# Patient Record
Sex: Male | Born: 1965 | Race: Black or African American | Hispanic: No | Smoking: Current every day smoker
Health system: Southern US, Community
[De-identification: ages and names within clinical notes are randomized; demographics above are authoritative.]

## PROBLEM LIST (undated history)

## (undated) DIAGNOSIS — H5440 Blindness, one eye, unspecified eye: Secondary | ICD-10-CM

## (undated) DIAGNOSIS — E119 Type 2 diabetes mellitus without complications: Secondary | ICD-10-CM

## (undated) DIAGNOSIS — I5020 Unspecified systolic (congestive) heart failure: Secondary | ICD-10-CM

## (undated) DIAGNOSIS — I429 Cardiomyopathy, unspecified: Secondary | ICD-10-CM

## (undated) DIAGNOSIS — I5041 Acute combined systolic (congestive) and diastolic (congestive) heart failure: Secondary | ICD-10-CM

## (undated) DIAGNOSIS — F329 Major depressive disorder, single episode, unspecified: Secondary | ICD-10-CM

## (undated) DIAGNOSIS — N183 Chronic kidney disease, stage 3 unspecified: Secondary | ICD-10-CM

## (undated) DIAGNOSIS — F32A Depression, unspecified: Secondary | ICD-10-CM

## (undated) DIAGNOSIS — F411 Generalized anxiety disorder: Secondary | ICD-10-CM

## (undated) DIAGNOSIS — I10 Essential (primary) hypertension: Secondary | ICD-10-CM

## (undated) DIAGNOSIS — F1410 Cocaine abuse, uncomplicated: Secondary | ICD-10-CM

## (undated) DIAGNOSIS — Z720 Tobacco use: Secondary | ICD-10-CM

## (undated) DIAGNOSIS — G4733 Obstructive sleep apnea (adult) (pediatric): Secondary | ICD-10-CM

## (undated) DIAGNOSIS — I639 Cerebral infarction, unspecified: Secondary | ICD-10-CM

## (undated) DIAGNOSIS — E7800 Pure hypercholesterolemia, unspecified: Secondary | ICD-10-CM

## (undated) HISTORY — DX: Type 2 diabetes mellitus without complications: E11.9

## (undated) HISTORY — PX: CATARACT EXTRACTION: SUR2

## (undated) HISTORY — DX: Major depressive disorder, single episode, unspecified: F32.9

## (undated) HISTORY — DX: Pure hypercholesterolemia, unspecified: E78.00

## (undated) HISTORY — DX: Depression, unspecified: F32.A

---

## 2014-03-02 ENCOUNTER — Inpatient Hospital Stay (HOSPITAL_COMMUNITY)
Admission: EM | Admit: 2014-03-02 | Discharge: 2014-03-05 | DRG: 287 | Disposition: A | Discharge status: Home / Self Care | Payer: Medicaid Other | Attending: Internal Medicine | Admitting: Internal Medicine

## 2014-03-02 ENCOUNTER — Encounter (HOSPITAL_COMMUNITY): Payer: Self-pay | Admitting: Emergency Medicine

## 2014-03-02 ENCOUNTER — Emergency Department (HOSPITAL_COMMUNITY): Payer: Medicaid Other

## 2014-03-02 DIAGNOSIS — Z5989 Other problems related to housing and economic circumstances: Secondary | ICD-10-CM

## 2014-03-02 DIAGNOSIS — E8770 Fluid overload, unspecified: Secondary | ICD-10-CM | POA: Insufficient documentation

## 2014-03-02 DIAGNOSIS — Z6837 Body mass index (BMI) 37.0-37.9, adult: Secondary | ICD-10-CM

## 2014-03-02 DIAGNOSIS — E876 Hypokalemia: Secondary | ICD-10-CM | POA: Diagnosis not present

## 2014-03-02 DIAGNOSIS — I428 Other cardiomyopathies: Secondary | ICD-10-CM

## 2014-03-02 DIAGNOSIS — I5021 Acute systolic (congestive) heart failure: Secondary | ICD-10-CM

## 2014-03-02 DIAGNOSIS — Z598 Other problems related to housing and economic circumstances: Secondary | ICD-10-CM

## 2014-03-02 DIAGNOSIS — I498 Other specified cardiac arrhythmias: Secondary | ICD-10-CM | POA: Diagnosis not present

## 2014-03-02 DIAGNOSIS — I5041 Acute combined systolic (congestive) and diastolic (congestive) heart failure: Principal | ICD-10-CM | POA: Diagnosis present

## 2014-03-02 DIAGNOSIS — I10 Essential (primary) hypertension: Secondary | ICD-10-CM | POA: Diagnosis present

## 2014-03-02 DIAGNOSIS — Z91199 Patient's noncompliance with other medical treatment and regimen due to unspecified reason: Secondary | ICD-10-CM

## 2014-03-02 DIAGNOSIS — I5042 Chronic combined systolic (congestive) and diastolic (congestive) heart failure: Secondary | ICD-10-CM

## 2014-03-02 DIAGNOSIS — Z8249 Family history of ischemic heart disease and other diseases of the circulatory system: Secondary | ICD-10-CM

## 2014-03-02 DIAGNOSIS — Z9119 Patient's noncompliance with other medical treatment and regimen: Secondary | ICD-10-CM

## 2014-03-02 DIAGNOSIS — J189 Pneumonia, unspecified organism: Secondary | ICD-10-CM | POA: Diagnosis present

## 2014-03-02 DIAGNOSIS — I129 Hypertensive chronic kidney disease with stage 1 through stage 4 chronic kidney disease, or unspecified chronic kidney disease: Secondary | ICD-10-CM | POA: Diagnosis present

## 2014-03-02 DIAGNOSIS — N182 Chronic kidney disease, stage 2 (mild): Secondary | ICD-10-CM | POA: Diagnosis present

## 2014-03-02 DIAGNOSIS — Z23 Encounter for immunization: Secondary | ICD-10-CM

## 2014-03-02 DIAGNOSIS — Z6838 Body mass index (BMI) 38.0-38.9, adult: Secondary | ICD-10-CM

## 2014-03-02 DIAGNOSIS — F17200 Nicotine dependence, unspecified, uncomplicated: Secondary | ICD-10-CM | POA: Diagnosis present

## 2014-03-02 DIAGNOSIS — E785 Hyperlipidemia, unspecified: Secondary | ICD-10-CM | POA: Diagnosis present

## 2014-03-02 DIAGNOSIS — Z5987 Material hardship due to limited financial resources, not elsewhere classified: Secondary | ICD-10-CM

## 2014-03-02 DIAGNOSIS — J811 Chronic pulmonary edema: Secondary | ICD-10-CM

## 2014-03-02 HISTORY — DX: Cardiomyopathy, unspecified: I42.9

## 2014-03-02 HISTORY — DX: Acute combined systolic (congestive) and diastolic (congestive) heart failure: I50.41

## 2014-03-02 HISTORY — DX: Morbid (severe) obesity due to excess calories: E66.01

## 2014-03-02 HISTORY — DX: Tobacco use: Z72.0

## 2014-03-02 HISTORY — DX: Essential (primary) hypertension: I10

## 2014-03-02 LAB — BASIC METABOLIC PANEL
BUN: 12 mg/dL (ref 6–23)
CO2: 24 mEq/L (ref 19–32)
Calcium: 9.2 mg/dL (ref 8.4–10.5)
Chloride: 103 mEq/L (ref 96–112)
Creatinine, Ser: 1.2 mg/dL (ref 0.50–1.35)
GFR calc Af Amer: 82 mL/min — ABNORMAL LOW (ref >90)
GFR calc non Af Amer: 70 mL/min — ABNORMAL LOW (ref >90)
Glucose, Bld: 133 mg/dL — ABNORMAL HIGH (ref 70–99)
Potassium: 3.8 mEq/L (ref 3.7–5.3)
Sodium: 141 mEq/L (ref 137–147)

## 2014-03-02 LAB — CBC WITH DIFFERENTIAL/PLATELET
Basophils Absolute: 0 10*3/uL (ref 0.0–0.1)
Basophils Relative: 1 % (ref 0–1)
Eosinophils Absolute: 0.1 10*3/uL (ref 0.0–0.7)
Eosinophils Relative: 2 % (ref 0–5)
HCT: 42.2 % (ref 39.0–52.0)
Hemoglobin: 14.4 g/dL (ref 13.0–17.0)
Lymphocytes Relative: 42 % (ref 12–46)
Lymphs Abs: 1.8 10*3/uL (ref 0.7–4.0)
MCH: 29.8 pg (ref 26.0–34.0)
MCHC: 34.1 g/dL (ref 30.0–36.0)
MCV: 87.2 fL (ref 78.0–100.0)
Monocytes Absolute: 0.4 10*3/uL (ref 0.1–1.0)
Monocytes Relative: 8 % (ref 3–12)
Neutro Abs: 2.1 10*3/uL (ref 1.7–7.7)
Neutrophils Relative %: 48 % (ref 43–77)
Platelets: 219 10*3/uL (ref 150–400)
RBC: 4.84 MIL/uL (ref 4.22–5.81)
RDW: 13.6 % (ref 11.5–15.5)
WBC: 4.4 10*3/uL (ref 4.0–10.5)

## 2014-03-02 LAB — GLUCOSE, CAPILLARY
Glucose-Capillary: 157 mg/dL — ABNORMAL HIGH (ref 70–99)
Glucose-Capillary: 128 mg/dL — ABNORMAL HIGH (ref 70–99)

## 2014-03-02 LAB — TROPONIN I
Troponin I: <0.3 ng/mL (ref <0.30)
Troponin I: <0.3 ng/mL (ref <0.30)
Troponin I: <0.3 ng/mL (ref <0.30)

## 2014-03-02 LAB — PRO B NATRIURETIC PEPTIDE: Pro B Natriuretic peptide (BNP): 647.8 pg/mL — ABNORMAL HIGH (ref 0–125)

## 2014-03-02 LAB — HEMOGLOBIN A1C
Hgb A1c MFr Bld: 6.5 % — ABNORMAL HIGH (ref <5.7)
Mean Plasma Glucose: 140 mg/dL — ABNORMAL HIGH (ref <117)

## 2014-03-02 LAB — RAPID URINE DRUG SCREEN, HOSP PERFORMED
Amphetamines: NOT DETECTED
Barbiturates: NOT DETECTED
Benzodiazepines: NOT DETECTED
Cocaine: NOT DETECTED
Opiates: NOT DETECTED
Tetrahydrocannabinol: NOT DETECTED

## 2014-03-02 LAB — STREP PNEUMONIAE URINARY ANTIGEN: Strep Pneumo Urinary Antigen: NEGATIVE

## 2014-03-02 LAB — TSH: TSH: 1.478 u[IU]/mL (ref 0.350–4.500)

## 2014-03-02 MED ORDER — DEXTROSE 5 % IV SOLN
2.0000 g | INTRAVENOUS | Status: DC
  Administered 2014-03-03: 2 g via INTRAVENOUS
  Filled 2014-03-02: qty 2

## 2014-03-02 MED ORDER — DEXTROSE 5 % IV SOLN
500.0000 mg | Freq: Once | INTRAVENOUS | Status: AC
  Administered 2014-03-02: 500 mg via INTRAVENOUS

## 2014-03-02 MED ORDER — ACETAMINOPHEN 650 MG RE SUPP: 650.0000 mg | Freq: Four times a day (QID) | RECTAL | Status: DC | PRN

## 2014-03-02 MED ORDER — FUROSEMIDE 10 MG/ML IJ SOLN
40.0000 mg | Freq: Two times a day (BID) | INTRAMUSCULAR | Status: DC
  Administered 2014-03-02 – 2014-03-03 (×3): 40 mg via INTRAVENOUS
  Filled 2014-03-02 (×3): qty 4

## 2014-03-02 MED ORDER — PROMETHAZINE HCL 25 MG/ML IJ SOLN: 12.5000 mg | Freq: Four times a day (QID) | INTRAMUSCULAR | Status: DC | PRN

## 2014-03-02 MED ORDER — DEXTROSE 5 % IV SOLN
1.0000 g | Freq: Once | INTRAVENOUS | Status: DC
  Filled 2014-03-02: qty 10

## 2014-03-02 MED ORDER — INFLUENZA VAC SPLIT QUAD 0.5 ML IM SUSP
0.5000 mL | INTRAMUSCULAR | Status: AC
  Administered 2014-03-03: 0.5 mL via INTRAMUSCULAR
  Filled 2014-03-02: qty 0.5

## 2014-03-02 MED ORDER — FUROSEMIDE 10 MG/ML IJ SOLN
20.0000 mg | Freq: Once | INTRAMUSCULAR | Status: AC
  Administered 2014-03-02: 20 mg via INTRAVENOUS
  Filled 2014-03-02: qty 2

## 2014-03-02 MED ORDER — HYDRALAZINE HCL 20 MG/ML IJ SOLN
5.0000 mg | INTRAMUSCULAR | Status: DC | PRN
  Administered 2014-03-02: 5 mg via INTRAVENOUS
  Filled 2014-03-02: qty 1

## 2014-03-02 MED ORDER — HYDRALAZINE HCL 20 MG/ML IJ SOLN: 5.0000 mg | INTRAMUSCULAR | Status: DC | PRN

## 2014-03-02 MED ORDER — ACETAMINOPHEN 325 MG PO TABS
650.0000 mg | ORAL_TABLET | Freq: Four times a day (QID) | ORAL | Status: DC | PRN
  Administered 2014-03-02: 650 mg via ORAL
  Filled 2014-03-02: qty 2

## 2014-03-02 MED ORDER — DEXTROSE 5 % IV SOLN
1.0000 g | Freq: Once | INTRAVENOUS | Status: AC
  Administered 2014-03-02: 1 g via INTRAVENOUS
  Filled 2014-03-02: qty 10

## 2014-03-02 MED ORDER — ENOXAPARIN SODIUM 40 MG/0.4ML ~~LOC~~ SOLN
40.0000 mg | SUBCUTANEOUS | Status: DC
  Administered 2014-03-02 – 2014-03-03 (×2): 40 mg via SUBCUTANEOUS
  Filled 2014-03-02 (×4): qty 0.4

## 2014-03-02 MED ORDER — PROMETHAZINE HCL 25 MG PO TABS: 12.5000 mg | ORAL_TABLET | Freq: Four times a day (QID) | ORAL | Status: DC | PRN

## 2014-03-02 MED ORDER — PNEUMOCOCCAL VAC POLYVALENT 25 MCG/0.5ML IJ INJ
0.5000 mL | INJECTION | INTRAMUSCULAR | Status: AC
  Administered 2014-03-03: 0.5 mL via INTRAMUSCULAR
  Filled 2014-03-02: qty 0.5

## 2014-03-02 MED ORDER — SODIUM CHLORIDE 0.9 % IJ SOLN
3.0000 mL | Freq: Two times a day (BID) | INTRAMUSCULAR | Status: DC
  Administered 2014-03-02 – 2014-03-05 (×6): 3 mL via INTRAVENOUS

## 2014-03-02 MED ORDER — NITROGLYCERIN 2 % TD OINT
1.0000 [in_us] | TOPICAL_OINTMENT | Freq: Four times a day (QID) | TRANSDERMAL | Status: DC
  Administered 2014-03-02: 1 [in_us] via TOPICAL
  Filled 2014-03-02: qty 1

## 2014-03-02 MED ORDER — AZITHROMYCIN 250 MG PO TABS
250.0000 mg | ORAL_TABLET | Freq: Every day | ORAL | Status: DC
  Administered 2014-03-03: 250 mg via ORAL
  Filled 2014-03-02: qty 1

## 2014-03-02 NOTE — ED Provider Notes (Signed)
CSN: [id number]     Arrival date & time 03/02/14  4098 History   First MD Initiated Contact with Patient 03/02/14 [phone]     Chief Complaint  Patient presents with  . Shortness of Breath     (Consider location/radiation/quality/duration/timing/severity/associated sxs/prior Treatment) HPI  This is a 48 year old male with a history of hypertension and diabetes who presents with shortness of breath. He presents with a one-week history of progressive shortness of breath. He reports dyspnea on exertion and orthopnea. Patient also reports swelling in his bilateral lower extremities.  He denies any history of heart failure. He denies any history of coronary artery disease. Patient was noted to have labored respirations and would drop his oxygen saturations to 93% when transferring from the wheelchair to bed. Patient denies any chest pain. He does endorse a cough that is nonproductive. He denies any fevers.  Past Medical History  Diagnosis Date  . Hypertension   . Diabetes mellitus without complication    History reviewed. No pertinent past surgical history. History reviewed. No pertinent family history. History  Substance Use Topics  . Smoking status: Not on file  . Smokeless tobacco: Not on file  . Alcohol Use: No    Review of Systems  Constitutional: Negative.  Negative for fever.  Respiratory: Positive for cough and shortness of breath. Negative for chest tightness.   Cardiovascular: Positive for leg swelling. Negative for chest pain.  Gastrointestinal: Negative.  Negative for nausea, vomiting and abdominal pain.  Genitourinary: Negative.  Negative for dysuria.  Musculoskeletal: Negative for back pain.  Skin: Negative for rash.  Neurological: Negative for headaches.  All other systems reviewed and are negative.      Allergies  Review of patient's allergies indicates no known allergies.  Home Medications   Current Outpatient Rx  Name  Route  Sig  Dispense  Refill  .  Dextromethorphan-Guaifenesin (ROBITUSSIN DM PO)   Oral   Take 10 mLs by mouth every 6 (six) hours as needed (for cold symptoms).          BP 161/109  Pulse 86  Temp(Src) 98 F (36.7 C) (Oral)  Resp 23  SpO2 100% Physical Exam  Nursing note and vitals reviewed. Constitutional: He is oriented to person, place, and time.  Obese, tachypnea  HENT:  Head: Normocephalic and atraumatic.  Eyes: Pupils are equal, round, and reactive to light.  Neck: Neck supple. JVD present.  Cardiovascular: Normal rate, regular rhythm and normal heart sounds.   No murmur heard. Pulmonary/Chest: Effort normal and breath sounds normal. No respiratory distress. He has no wheezes.  Fair movement, final crackles at the bases  Abdominal: Soft. Bowel sounds are normal. There is no tenderness. There is no rebound.  Musculoskeletal: He exhibits edema.  2+ bilateral lower extremity edema, pitting to the mid shin  Lymphadenopathy:    He has no cervical adenopathy.  Neurological: He is alert and oriented to person, place, and time.  Skin: Skin is warm and dry.  Psychiatric: He has a normal mood and affect.    ED Course  Procedures (including critical care time) Labs Review Labs Reviewed  BASIC METABOLIC PANEL - Abnormal; Notable for the following:    Glucose, Bld 133 (*)    GFR calc non Af Amer 70 (*)    GFR calc Af Amer 82 (*)    All other components within normal limits  PRO B NATRIURETIC PEPTIDE - Abnormal; Notable for the following:    Pro B Natriuretic peptide (BNP) 647.8 (*)  All other components within normal limits  CULTURE, BLOOD (ROUTINE X 2)  CULTURE, BLOOD (ROUTINE X 2)  CBC WITH DIFFERENTIAL  TROPONIN I   Imaging Review Dg Chest Portable 1 View  03/02/2014   CLINICAL DATA:  48 year old male with shortness of breath  EXAM: PORTABLE CHEST - 1 VIEW  COMPARISON:  None.  FINDINGS: Upper limits normal heart size noted.  Mild pulmonary vascular congestion is noted.  Mild interstitial  prominence is identified.  There is no evidence of focal airspace disease, suspicious pulmonary nodule/mass, pleural effusion, or pneumothorax. No acute bony abnormalities are identified.  IMPRESSION: Interstitial prominence which may represent interstitial infection or interstitial edema.  Upper limits normal heart size and mild pulmonary vascular congestion.   Electronically Signed   By: Hassan Rowan M.D.   On: 03/02/2014 09:51     EKG Interpretation   Date/Time:  Tuesday March 02 2014 09:02:23 EDT Ventricular Rate:  86 PR Interval:  162 QRS Duration: 94 QT Interval:  390 QTC Calculation: 466 R Axis:   78 Text Interpretation:  Normal sinus rhythm with sinus arrhythmia Minimal  voltage criteria for LVH, may be normal variant Nonspecific T wave  abnormality Prolonged QT Abnormal ECG No prior for comparison Confirmed by  Patsie Mccardle  MD, Harith Mccadden ([id number]) on 03/02/2014 9:04:26 AM      MDM   Final diagnoses:  Community acquired pneumonia  Pulmonary edema    Patient presents with dyspnea on exertion, orthopnea and cough. Mild respiratory distress noted with tachypnea. Patient dropped his O2 sats with minimal exertion. Appears volume overloaded on exam. Workup notable for possible pulmonary vascular congestion plus or minus pneumonia. No receptors for age. Patient given azithromycin and Rocephin. Patient also given dose of IV Lasix 20 mg. Given oxygen requirement and clinical picture, the patient would benefit from admission for antibiotics and diuresis. May need echocardiogram as well.    Merryl Hacker, MD 03/02/14 1224

## 2014-03-02 NOTE — ED Notes (Signed)
Admit Doctor at bedside.  

## 2014-03-02 NOTE — ED Notes (Signed)
Patient states onset one week ago shortness of breath with productive cough yellow. Shortness of breath worsening overtime.  Edema bilateral lower feet +1 pedal pulse +1 full sensation.

## 2014-03-02 NOTE — ED Notes (Signed)
Attempted to call report to [place] on [facility], she told me that she would call back

## 2014-03-02 NOTE — Progress Notes (Signed)
Patient arrived from ED via stretcher.  Patient alert and oriented x4.  Patient currently denies pain.  BP 153/116.  Dr. Eulas Post paged to clarify orders for hydralazine prn.  Will continue to monitor patient.

## 2014-03-02 NOTE — ED Notes (Signed)
Pt reports sob that has been getting progressively worse for several days, and having swelling to feet. Labored respirations at triage, spo2 97% on room air. Unable to obtain ekg at triage due to pt unable to sit still.

## 2014-03-02 NOTE — Progress Notes (Signed)
ANTIBIOTIC CONSULT NOTE - INITIAL  Pharmacy Consult for ceftriaxone Indication: CAP  No Known Allergies  Patient Measurements: Ht 5' 10'' Wt 122.9 kg Body mass index is 38.88 kg/(m^2).   Vital Signs: Temp: 98 F (36.7 C) (03/17 0902) Temp src: Oral (03/17 0902) BP: 164/104 mmHg (03/17 1300) Pulse Rate: 86 (03/17 1300)  Intake/Output from this shift: Total I/O In: -  Out: 600 [Urine:600]  Labs:  Recent Labs  03/02/14 0915  WBC 4.4  HGB 14.4  PLT 219  CREATININE 1.20   CrCl is unknown because there is no height on file for the current visit. No results found for this basename: VANCOTROUGH, VANCOPEAK, VANCORANDOM, GENTTROUGH, GENTPEAK, GENTRANDOM, TOBRATROUGH, TOBRAPEAK, TOBRARND, AMIKACINPEAK, AMIKACINTROU, AMIKACIN,  in the last 72 hours   Microbiology: No results found for this or any previous visit (from the past 720 hour(s)).  Medical History: Past Medical History  Diagnosis Date  . Hypertension   . Diabetes mellitus without complication     Medications:  Scheduled:  . nitroGLYCERIN  1 inch Topical 4 times per day   Infusions:  . azithromycin 500 mg (03/02/14 1301)   Assessment: 48 yo M presents to ED with SOB that has progressively worsened over past week.  Patient received one time doses of ceftriazone IV 1g and azithromycin IV 500mg  in the ED.  Pharmacy consulted to continue dosing ceftriaxone for CAP. Patient is afebrile and initial labs reveal WBC wnl. CXR reveals interstitial prominence which may represent interstitial infection or interstitial edema.  Goal of Therapy:  Resolution of infection  Plan:  - s/p ceftriaxone IV 1gm in ED, give additional 1gm now, followed by 2gm q24h - no renal adjustment needed for ceftriaxone - will sign off  Ovid Curd E. Jacqlyn Larsen, PharmD Clinical Pharmacist - Resident Pager: [phone] Pharmacy: [phone] 03/02/2014 2:00 PM

## 2014-03-02 NOTE — ED Notes (Signed)
Report called to [facility] RN 

## 2014-03-02 NOTE — H&P (Signed)
Date: 03/02/2014               Patient Name:  Matthew [name] MRN: [id number]  DOB: May 03, 1966 Age / Sex: 48 y.o., male   PCP: [facility]         Medical Service: Internal Medicine Teaching Service         Attending Physician: Dr. Dominic Pea, DO    First Contact: Dr. Lesly Dukes Pager: [phone]  Second Contact: Dr. Randell Loop Pager: [phone]       After Hours (After 5p/  First Contact Pager: [phone]  weekends / holidays): Second Contact Pager: [phone]   Chief Complaint: Shortness of breath  History of Present Illness:  Matthew [name] is a 48 y.o. male unassigned patient with PMH HTN who presents to the ED with a chief complaint of shortness of breath.  Matthew [name] presents with a week long history of progressive shortness of breath. It is worse on exertion and especially when lying flat. He has had to sleep in a recliner for the past week. Dyspnea is associated with a cough productive of white sputum, as well as leg swelling. He has also had runny nose and itchy eyes along with subjective fevers, so he chalked his symptoms off to a cold and took some Robitussin, which did not help. He has not tried any other medications. He denies chest pain, nausea, vomiting, diarrhea. He denies weight gain.  This has never happened before. He denies a history of heart failure or coronary artery disease. He has never had a stress test. He does have a family history of heart disease; his father had heart attack in his 26s.  He is a patient at the [facility], but has not seen a provider there in over a year. I called them and they do not have records before July 2014 due to a change in ownership.  He used to take Atenolol for his blood pressure, but has not been compliant in several months. I called his pharmacy, last refill was 10/14 for Atenolol 50mg . He thinks he was on a diuretic in the past, but the pharmacy has no record of this. He cannot remember the name.  He smokes one  pack of cigarettes per week x10 years. Denies alcohol or drug use, including cocaine.  In the ED, patient was noted to have labored respirations and would drop his oxygen saturations to 93% when transferring from the wheelchair to bed. He was given Lasix 20 mg IV, nitroglycerin ointment, and started on azithromycin and ceftriaxone IV.   Meds: Current Facility-Administered Medications  Medication Dose Route Frequency Provider Last Rate Last Dose  . azithromycin (ZITHROMAX) 500 mg in dextrose 5 % 250 mL IVPB  500 mg Intravenous Once Merryl Hacker, MD 250 mL/hr at 03/02/14 1301 500 mg at 03/02/14 1301  . nitroGLYCERIN (NITROGLYN) 2 % ointment 1 inch  1 inch Topical 4 times per day Merryl Hacker, MD   1 inch at 03/02/14 1234   Current Outpatient Prescriptions  Medication Sig Dispense Refill  . Dextromethorphan-Guaifenesin (ROBITUSSIN DM PO) Take 10 mLs by mouth every 6 (six) hours as needed (for cold symptoms).        Allergies: Allergies as of 03/02/2014  . (No Known Allergies)   Past Medical History  Diagnosis Date  . Hypertension   . Diabetes mellitus without complication    History reviewed. No pertinent past surgical history. History reviewed. No pertinent family history. History   Social History  .  Marital Status: Married    Spouse Name: N/A    Number of Children: N/A  . Years of Education: N/A   Occupational History  . Not on file.   Social History Main Topics  . Smoking status: Not on file  . Smokeless tobacco: Not on file  . Alcohol Use: No  . Drug Use: No  . Sexual Activity: Not on file   Other Topics Concern  . Not on file   Social History Narrative  . No narrative on file    Review of Systems: Pertinent items are noted in HPI.  Physical Exam: Blood pressure 164/104, pulse 86, temperature 98 F (36.7 C), temperature source Oral, resp. rate 21, height 5\' 10"  (1.778 m), weight 271 lb (122.925 kg), SpO2 97.00%. Physical Exam  Constitutional: He  is oriented to person, place, and time and well-developed, well-nourished, and in no distress.  Obese.  HENT:  Head: Normocephalic and atraumatic.  Mouth/Throat: Oropharynx is clear and moist.  Eyes: Conjunctivae and EOM are normal. Pupils are equal, round, and reactive to light.  Neck: Normal range of motion. Neck supple. No JVD (No obvious JVD, but exam limited by body habitus) present.  Cardiovascular: Normal rate, regular rhythm and normal heart sounds.  Exam reveals no gallop and no friction rub.   No murmur heard. Pulmonary/Chest: Effort normal. No respiratory distress. He has no wheezes. He has rales (Fine bibasilar crackles heard bilaterally). He exhibits no tenderness.  Abdominal: Soft. Bowel sounds are normal. He exhibits no distension. There is no tenderness.  Musculoskeletal: Normal range of motion. He exhibits edema (2+ pitting pretibial edema). He exhibits no tenderness.  Neurological: He is alert and oriented to person, place, and time. No cranial nerve deficit.  Skin: Skin is warm and dry. No rash noted. He is not diaphoretic. No erythema.  Psychiatric: Affect normal.     Lab results: Basic Metabolic Panel:  Recent Labs  03/02/14 0915  NA 141  K 3.8  CL 103  CO2 24  GLUCOSE 133*  BUN 12  CREATININE 1.20  CALCIUM 9.2   Liver Function Tests: No results found for this basename: AST, ALT, ALKPHOS, BILITOT, PROT, ALBUMIN,  in the last 72 hours No results found for this basename: LIPASE, AMYLASE,  in the last 72 hours No results found for this basename: AMMONIA,  in the last 72 hours CBC:  Recent Labs  03/02/14 0915  WBC 4.4  NEUTROABS 2.1  HGB 14.4  HCT 42.2  MCV 87.2  PLT 219   Cardiac Enzymes:  Recent Labs  03/02/14 0920  TROPONINI <0.30   BNP:  Recent Labs  03/02/14 0920  PROBNP 647.8*   D-Dimer: No results found for this basename: DDIMER,  in the last 72 hours CBG: No results found for this basename: GLUCAP,  in the last 72  hours Hemoglobin A1C: No results found for this basename: HGBA1C,  in the last 72 hours Fasting Lipid Panel: No results found for this basename: CHOL, HDL, LDLCALC, TRIG, CHOLHDL, LDLDIRECT,  in the last 72 hours Thyroid Function Tests: No results found for this basename: TSH, T4TOTAL, FREET4, T3FREE, THYROIDAB,  in the last 72 hours Anemia Panel: No results found for this basename: VITAMINB12, FOLATE, FERRITIN, TIBC, IRON, RETICCTPCT,  in the last 72 hours Coagulation: No results found for this basename: LABPROT, INR,  in the last 72 hours Urine Drug Screen: Drugs of Abuse  No results found for this basename: labopia,  cocainscrnur,  labbenz,  amphetmu,  thcu,  labbarb    Alcohol Level: No results found for this basename: ETH,  in the last 72 hours Urinalysis: No results found for this basename: COLORURINE, APPERANCEUR, LABSPEC, PHURINE, GLUCOSEU, HGBUR, BILIRUBINUR, KETONESUR, PROTEINUR, UROBILINOGEN, NITRITE, LEUKOCYTESUR,  in the last 72 hours   Imaging results:  Dg Chest Portable 1 View  03/02/2014   CLINICAL DATA:  48 year old male with shortness of breath  EXAM: PORTABLE CHEST - 1 VIEW  COMPARISON:  None.  FINDINGS: Upper limits normal heart size noted.  Mild pulmonary vascular congestion is noted.  Mild interstitial prominence is identified.  There is no evidence of focal airspace disease, suspicious pulmonary nodule/mass, pleural effusion, or pneumothorax. No acute bony abnormalities are identified.  IMPRESSION: Interstitial prominence which may represent interstitial infection or interstitial edema.  Upper limits normal heart size and mild pulmonary vascular congestion.   Electronically Signed   By: Hassan Rowan M.D.   On: 03/02/2014 09:51    Other results: EKG: There are no previous tracings available for comparison. Cormal sinus rhythm, normal axis, rate 86. T wave flattening in V4-6, but no inversions. QTc slightly prolonged at 466.  Assessment & Plan by Problem: Matthew [name]  is a 48 y.o. male unassigned patient with PMH HTN who presents to the ED with a chief complaint of shortness of breath.  #Progressive dyspnea with volume overload, rule out new onset heart failure - Patient presents with one-week of progressive dyspnea, orthopnea, and leg swelling. Pro-BNP is elevated at 647.8. Chest x-ray shows possible interstitial edema, cardiomegaly (portable film) and pulmonary vascular congestion. He has no prior history of heart failure, but has uncontrolled hypertension as well as a family history of coronary artery disease at a young age. We'll rule out MI with serial troponins as this is a common cause of acute heart failure. I do not think he has pneumonia as he is afebrile without leukocytosis and we have a better alternative explanation, but we will continue antibiotics until a diagnosis of heart failure is confirmed. Wells score 0, low risk for PE and no significant oxygen desaturations. - Admit to IMTS, telemetry - Lasix 40 mg IV twice a day - 2D echo - Trending troponins - TSH - UDS - HIV - Repeat CXR in am, PA/lateral - Repeat EKG in am - Strict ins and outs - Daily weights - Heart healthy/carb modified diet - Continue CAP coverage with azithromycin and ceftriaxone IV until 2D echo results - Checking legionella and strep pneumo antigens - Follow up blood cultures x2 - Tylenol when necessary pain or fever - Phenergan when necessary for nausea (given slightly prolonged QTc) - BMP in am  #HTN - BP 164/104 currently. Previously on Atenolol, but noncompliant at home. - Hydralazine 5 mg every 4 hours when necessary for systolic blood pressure >161 - Will add most appropriate BP meds tomorrow based on 2D echo results (ACE-I, BB, etc.)  #CKD stage 2 - Cr 1.2, GFR 82. - Continue to monitor with daily BMP  #Hyperglycemia - CBG 133. He denies a history of diabetes. - Checking hemoglobin A1c - CBG monitoring every 4 hours  #DVT PPX - Lovenox subq   Dispo:  Disposition is deferred at this time, awaiting improvement of current medical problems. Anticipated discharge in approximately 1-3 day(s).   The patient does have a current PCP [facility]) and does not need an [facility] hospital follow-up appointment after discharge.  The patient does not have transportation limitations that hinder transportation to clinic appointments.  Signed: Judson Roch  Tationa Stech, MD 03/02/2014, 1:59 PM   Lesly Dukes, MD  [email or web address] Pager # [phone] Office # [phone]

## 2014-03-03 ENCOUNTER — Other Ambulatory Visit: Payer: Self-pay

## 2014-03-03 ENCOUNTER — Inpatient Hospital Stay (HOSPITAL_COMMUNITY): Payer: Medicaid Other

## 2014-03-03 ENCOUNTER — Encounter (HOSPITAL_COMMUNITY): Payer: Self-pay | Admitting: Nurse Practitioner

## 2014-03-03 DIAGNOSIS — I420 Dilated cardiomyopathy: Secondary | ICD-10-CM | POA: Insufficient documentation

## 2014-03-03 DIAGNOSIS — E8779 Other fluid overload: Secondary | ICD-10-CM

## 2014-03-03 DIAGNOSIS — I059 Rheumatic mitral valve disease, unspecified: Secondary | ICD-10-CM

## 2014-03-03 DIAGNOSIS — I129 Hypertensive chronic kidney disease with stage 1 through stage 4 chronic kidney disease, or unspecified chronic kidney disease: Secondary | ICD-10-CM

## 2014-03-03 DIAGNOSIS — N182 Chronic kidney disease, stage 2 (mild): Secondary | ICD-10-CM

## 2014-03-03 DIAGNOSIS — I5042 Chronic combined systolic (congestive) and diastolic (congestive) heart failure: Secondary | ICD-10-CM

## 2014-03-03 DIAGNOSIS — E119 Type 2 diabetes mellitus without complications: Secondary | ICD-10-CM

## 2014-03-03 DIAGNOSIS — I5021 Acute systolic (congestive) heart failure: Secondary | ICD-10-CM | POA: Insufficient documentation

## 2014-03-03 DIAGNOSIS — R0989 Other specified symptoms and signs involving the circulatory and respiratory systems: Secondary | ICD-10-CM

## 2014-03-03 DIAGNOSIS — E876 Hypokalemia: Secondary | ICD-10-CM

## 2014-03-03 DIAGNOSIS — F17200 Nicotine dependence, unspecified, uncomplicated: Secondary | ICD-10-CM | POA: Diagnosis present

## 2014-03-03 DIAGNOSIS — R0609 Other forms of dyspnea: Secondary | ICD-10-CM

## 2014-03-03 LAB — LEGIONELLA ANTIGEN, URINE: Legionella Antigen, Urine: NEGATIVE

## 2014-03-03 LAB — BASIC METABOLIC PANEL
BUN: 12 mg/dL (ref 6–23)
CO2: 27 mEq/L (ref 19–32)
Calcium: 9.3 mg/dL (ref 8.4–10.5)
Chloride: 102 mEq/L (ref 96–112)
Creatinine, Ser: 1.28 mg/dL (ref 0.50–1.35)
GFR calc Af Amer: 76 mL/min — ABNORMAL LOW (ref >90)
GFR calc non Af Amer: 65 mL/min — ABNORMAL LOW (ref >90)
Glucose, Bld: 113 mg/dL — ABNORMAL HIGH (ref 70–99)
Potassium: 3.6 mEq/L — ABNORMAL LOW (ref 3.7–5.3)
Sodium: 144 mEq/L (ref 137–147)

## 2014-03-03 LAB — HIV ANTIBODY (ROUTINE TESTING W REFLEX): HIV: NONREACTIVE

## 2014-03-03 LAB — GLUCOSE, CAPILLARY
Glucose-Capillary: 101 mg/dL — ABNORMAL HIGH (ref 70–99)
Glucose-Capillary: 106 mg/dL — ABNORMAL HIGH (ref 70–99)
Glucose-Capillary: 107 mg/dL — ABNORMAL HIGH (ref 70–99)
Glucose-Capillary: 108 mg/dL — ABNORMAL HIGH (ref 70–99)
Glucose-Capillary: 118 mg/dL — ABNORMAL HIGH (ref 70–99)
Glucose-Capillary: 89 mg/dL (ref 70–99)

## 2014-03-03 MED ORDER — FUROSEMIDE 10 MG/ML IJ SOLN
80.0000 mg | Freq: Two times a day (BID) | INTRAMUSCULAR | Status: DC
  Administered 2014-03-04 – 2014-03-05 (×3): 80 mg via INTRAVENOUS
  Filled 2014-03-03 (×5): qty 8

## 2014-03-03 MED ORDER — HYDRALAZINE HCL 10 MG PO TABS
10.0000 mg | ORAL_TABLET | Freq: Three times a day (TID) | ORAL | Status: DC
  Administered 2014-03-03 – 2014-03-05 (×6): 10 mg via ORAL
  Filled 2014-03-03 (×9): qty 1

## 2014-03-03 MED ORDER — CARVEDILOL 3.125 MG PO TABS
3.1250 mg | ORAL_TABLET | Freq: Two times a day (BID) | ORAL | Status: DC
  Administered 2014-03-03 – 2014-03-05 (×4): 3.125 mg via ORAL
  Filled 2014-03-03 (×6): qty 1

## 2014-03-03 MED ORDER — ASPIRIN 81 MG PO CHEW
81.0000 mg | CHEWABLE_TABLET | Freq: Every day | ORAL | Status: DC
  Administered 2014-03-03 – 2014-03-05 (×3): 81 mg via ORAL
  Filled 2014-03-03 (×3): qty 1

## 2014-03-03 MED ORDER — IPRATROPIUM-ALBUTEROL 0.5-2.5 (3) MG/3ML IN SOLN
3.0000 mL | RESPIRATORY_TRACT | Status: DC
  Administered 2014-03-03: 3 mL via RESPIRATORY_TRACT
  Filled 2014-03-03: qty 3

## 2014-03-03 MED ORDER — ISOSORBIDE MONONITRATE 15 MG HALF TABLET
15.0000 mg | ORAL_TABLET | Freq: Every day | ORAL | Status: DC
  Administered 2014-03-03 – 2014-03-05 (×3): 15 mg via ORAL
  Filled 2014-03-03 (×3): qty 1

## 2014-03-03 MED ORDER — POTASSIUM CHLORIDE CRYS ER 20 MEQ PO TBCR
40.0000 meq | EXTENDED_RELEASE_TABLET | Freq: Once | ORAL | Status: AC
  Administered 2014-03-05: 40 meq via ORAL
  Filled 2014-03-03: qty 2

## 2014-03-03 MED ORDER — IPRATROPIUM-ALBUTEROL 0.5-2.5 (3) MG/3ML IN SOLN: 3.0000 mL | RESPIRATORY_TRACT | Status: DC | PRN

## 2014-03-03 NOTE — Consult Note (Signed)
Patient seen and examined. Agree with history above by Murray Hodgkins. The patient has at least a fiber history of hypertension and unsure whether he has been well controlled or not. He was originally diagnosed by a family physician several years ago and went without medicines for a few years and has taken no medicines for the past couple of months. He has no ischemic symptoms but does have a strong family history of cardiac disease and smokes cigarettes. 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  IMPRESSIONS:  1. Cardiomyopathy undetermined type-the history would favor perhaps long-standing uncontrolled hypertension as a cause but certainly ischemia and coronary artery disease needs to be assessed. The marked left atrial enlargement as well as the tortuosity aorta suggests long-standing hypertension as a cause. 2. Obesity 3. Hypertension currently uncontrolled 4. Tobacco abuse advised to stop  RECOMMENDATIONS:  Agree with catheterization as above. I discussed this with the patient and his wife and his group will to proceed in the morning. I would hold off on ACE inhibition and initiate additional antihypertensives including carvedilol as well as hydralazine. He likely will require multidrug regimen for control of his blood pressure. He and his wife have a home blood pressure cuff and advised him to monitor blood pressures at home.  Kerry Hough. MD [facility]  03/03/2014

## 2014-03-03 NOTE — Discharge Summary (Signed)
Name: Matthew [name] MRN: [id number] DOB: 09-05-1966 48 y.o. PCP: No Pcp Per Patient - He will establish with the [facility]  Date of Admission: 03/02/2014  9:00 AM Date of Discharge: 03/05/2014 Attending Physician: Dominic Pea, DO  Discharge Diagnosis: Principal Problem:   Acute combined systolic and diastolic heart failure Active Problems:   Pneumonia   Hypertension   Tobacco abuse   Morbid obesity   NICM (nonischemic cardiomyopathy)  Discharge Medications:   Medication List         aspirin 81 MG chewable tablet  Chew 1 tablet (81 mg total) by mouth daily.     carvedilol 3.125 MG tablet  Commonly known as:  COREG  Take 1 tablet (3.125 mg total) by mouth 2 (two) times daily with a meal.     furosemide 40 MG tablet  Commonly known as:  LASIX  Take 1 tablet (40 mg total) by mouth 2 (two) times daily.     hydrALAZINE 10 MG tablet  Commonly known as:  APRESOLINE  Take 1 tablet (10 mg total) by mouth every 8 (eight) hours.     isosorbide mononitrate 30 MG 24 hr tablet  Commonly known as:  IMDUR  Take 0.5 tablets (15 mg total) by mouth daily.     lisinopril 5 MG tablet  Commonly known as:  PRINIVIL,ZESTRIL  Take 1 tablet (5 mg total) by mouth daily.     lovastatin 20 MG tablet  Commonly known as:  MEVACOR  Take 2 tablets (40 mg total) by mouth at bedtime.     Potassium Chloride ER 20 MEQ Tbcr  Take 20 mEq by mouth daily.     ROBITUSSIN DM PO  Take 10 mLs by mouth every 6 (six) hours as needed (for cold symptoms).        Disposition and follow-up:   Mr.Matthew [name] was discharged from [facility] in Stable condition.  At the hospital follow up visit please address:  1.  This is a pt with new diagnoses of CHF and DM2. He has no insurance. Referral to [facility]. Can he afford his meds? (Got a [place] letter at D/c.) Referral to [facility]. Recurrent dyspnea on new medication regimen? Please stress that he see Dr. Verl Blalock on 4/1 (HF doc @ [facility]).  2.  Labs / imaging needed at time of follow-up: BMP (Cr), Recommend outpatient sleep study  3.  Pending labs/ test needing follow-up: None  Follow-up Appointments: Follow-up Information   Follow up with Vertell Novak, MD On 03/10/2014. (3:15 for hospital follow up.)    Specialty:  Internal Medicine   Contact information:   [place] [id number] [phone]       Follow up with Jenell Milliner, MD On 03/17/2014. (10:45 am, this is the heart failure clinic inside the [facility])    Specialties:  Cardiology, Internal Medicine   Contact information:   [address] [place] [address] [phone]       Discharge Instructions: Discharge Orders   Future Appointments Provider Department Dept Phone   03/10/2014 3:15 PM Olga Millers, MD [place] [phone]   03/17/2014 10:45 AM Renella Cunas, MD [place] [phone]   Future Orders Complete By Expires   Call MD for:  difficulty breathing, headache or visual disturbances  As directed    Call MD for:  severe uncontrolled pain  As directed  Diet - low sodium heart healthy  As directed    Increase activity slowly  As directed       Consultations: Treatment Team:  Rounding Lbcardiology, MD  Procedures Performed:  Dg Chest 2 View  03/03/2014   CLINICAL DATA:  Follow-up pulmonary edema  EXAM: CHEST  2 VIEW  COMPARISON:  DG CHEST 1V PORT dated 03/02/2014  FINDINGS: The lungs are well-expanded. The pulmonary interstitium has become less prominent. There remains increased interstitial density bilaterally however. The cardiac silhouette is normal in size. The pulmonary vascularity is prominent centrally. There is no pleural effusion. There is mild tortuosity of the descending thoracic aorta.  IMPRESSION: The findings are consistent with improving pulmonary interstitial edema.   Electronically Signed   By: David   Martinique   On: 03/03/2014 08:24   Dg Chest Portable 1 View  03/02/2014   CLINICAL DATA:  48 year old male with shortness of breath  EXAM: PORTABLE CHEST - 1 VIEW  COMPARISON:  None.  FINDINGS: Upper limits normal heart size noted.  Mild pulmonary vascular congestion is noted.  Mild interstitial prominence is identified.  There is no evidence of focal airspace disease, suspicious pulmonary nodule/mass, pleural effusion, or pneumothorax. No acute bony abnormalities are identified.  IMPRESSION: Interstitial prominence which may represent interstitial infection or interstitial edema.  Upper limits normal heart size and mild pulmonary vascular congestion.   Electronically Signed   By: Hassan Rowan M.D.   On: 03/02/2014 09:51    2D Echo:  ------------------------------------------------------------ Transthoracic Echocardiography  Patient: Matthew [name] MR #: [id number] Study Date: 03/03/2014 Gender: M Age: 42 Height: 152.4cm Weight: 122.3kg BSA: 2.50m2 Pt. Status: Room: B16C  SONOGRAPHER ELeavy CellaOInez Pilgrim Ryan PERFORMING [facility], Inpatient cc:  ------------------------------------------------------------ LV EF: 25% - 30%  ------------------------------------------------------------ History: PMH: HIV, Smoker, Pneumonia Volume Overload Risk factors: Hypertension.  ------------------------------------------------------------ Study Conclusions  - Left ventricle: The cavity size was mildly dilated. There was moderate concentric hypertrophy. Systolic function was severely reduced. The estimated ejection fraction was in the range of 25% to 30%. Severe global hypokinesis with regional variation - there appears to be more prounounced inferolateral hypokinesis to akinesis. Doppler parameters are consistent with pseudonormal left ventricular relaxation (grade2 diastolic dysfunction). The E/A ratio is 2.8. The E/e' ratio is >15, suggesting markedly elevated LV filling pressure. -  Mitral valve: Mildly thickened leaflets . Mild regurgitation. - Left atrium: Severely dilated (30 cm2). - Inferior vena cava: The vessel was normal in size; the respirophasic diameter changes were in the normal range (= 50%); findings are consistent with normal central venous pressure. - Pericardium, extracardiac: There was no pericardial effusion. Transthoracic echocardiography. M-mode, complete 2D, spectral Doppler, and color Doppler. Height: Height: 152.4cm. Height: 60in. Weight: Weight: 122.3kg. Weight: 269lb. Body mass index: BMI: 52.6kg/m^2. Body surface area: BSA: 2.378m. Blood pressure: 179/77. Patient status: Inpatient. Location: Bedside.  ------------------------------------------------------------   Cardiac Cath:  Cardiac Catheterization Operative Report  JaHarbor [id number] PM  No PCP Per Patient  Procedure Performed:  1. Left Heart Catheterization 2. Selective Coronary Angiography 3. Right Heart Catheterization Operator: ChLauree ChandlerMD  Indication: [age] male with history of obesity, tobacco abuse, new cardiomyopathy, acute systolic CHF.  Procedure Details:  The risks, benefits, complications, treatment options, and expected outcomes were discussed with the patient. The patient and/or family concurred with the proposed plan, giving informed consent. The patient was brought to the cath lab after IV hydration was begun and oral premedication was given. The patient was further sedated  with Versed and Fentanyl. The right groin was prepped and draped in the usual manner. Using the modified Seldinger access technique, a 5 French sheath was placed in the right femoral artery. A 7 French sheath was inserted into the right femoral vein. A balloon tipped catheter was used to perform a right heart catheterization. Standard diagnostic catheters were used to perform selective coronary angiography. A pigtail catheter was used to measure left ventricular  pressures. There were no immediate complications. The patient was taken to the recovery area in stable condition.  Hemodynamic Findings:  Ao: 144/99  LV: 153/20/32  RA: 8  RV: 35/8/11  PA: 41/19 (mean 33)  PCWP: 14  Fick Cardiac Output: 4.3 L/min  Fick Cardiac Index: 1.84 L/min/m2  Central Aortic Saturation: 98%  Pulmonary Artery Saturation: 61%  Angiographic Findings:  Left main: No obstructive disease.  Left Anterior Descending Artery: Large caliber vessel that courses to the apex. There is a large caliber diagonal vessel. No obstructive disease.  Circumflex Artery: Large caliber vessel with moderate caliber early obtuse marginal branch, smaller caliber posterolateral branch. No obstructive disease.  Right Coronary Artery: Large caliber dominant vessel with no obstructive disease.  Left Ventricular Angiogram: Deferred.  Impression:  1. No angiographic evidence of CAD  2. Mildly elevated filling pressures.  Recommendations: Continue current medical management.  Complications: None; patient tolerated the procedure well.   Admission HPI:  Dezman Granda is a 48 y.o. male unassigned patient with PMH HTN who presents to the ED with a chief complaint of shortness of breath.   Mr. Hegeman presents with a week long history of progressive shortness of breath. It is worse on exertion and especially when lying flat. He has had to sleep in a recliner for the past week. Dyspnea is associated with a cough productive of white sputum, as well as leg swelling. He has also had runny nose and itchy eyes along with subjective fevers, so he chalked his symptoms off to a cold and took some Robitussin, which did not help. He has not tried any other medications. He denies chest pain, nausea, vomiting, diarrhea. He denies weight gain.   This has never happened before. He denies a history of heart failure or coronary artery disease. He has never had a stress test. He does have a family history of heart disease;  his father had heart attack in his 7s.   He is a patient at the [facility] clinic, but has not seen a provider there in over a year. I called them and they do not have records before July 2014 due to a change in ownership.   He used to take Atenolol for his blood pressure, but has not been compliant in several months. I called his pharmacy, last refill was 10/14 for Atenolol 25m. He thinks he was on a diuretic in the past, but the pharmacy has no record of this. He cannot remember the name.   He smokes one pack of cigarettes per week x10 years. Denies alcohol or drug use, including cocaine.   In the ED, patient was noted to have labored respirations and would drop his oxygen saturations to 93% when transferring from the wheelchair to bed. He was given Lasix 20 mg IV, nitroglycerin ointment, and started on azithromycin and ceftriaxone IV.  Physical Exam:  Blood pressure 164/104, pulse 86, temperature 98 F (36.7 C), temperature source Oral, resp. rate 21, height 5' 10" (1.778 m), weight 271 lb (122.925 kg), SpO2 97.00%.  Physical Exam  Constitutional:  He is oriented to person, place, and time and well-developed, well-nourished, and in no distress.  Obese.  HENT:  Head: Normocephalic and atraumatic.  Mouth/Throat: Oropharynx is clear and moist.  Eyes: Conjunctivae and EOM are normal. Pupils are equal, round, and reactive to light.  Neck: Normal range of motion. Neck supple. No JVD (No obvious JVD, but exam limited by body habitus) present.  Cardiovascular: Normal rate, regular rhythm and normal heart sounds. Exam reveals no gallop and no friction rub.  No murmur heard.  Pulmonary/Chest: Effort normal. No respiratory distress. He has no wheezes. He has rales (Fine bibasilar crackles heard bilaterally). He exhibits no tenderness.  Abdominal: Soft. Bowel sounds are normal. He exhibits no distension. There is no tenderness.  Musculoskeletal: Normal range of motion. He exhibits edema (2+  pitting pretibial edema). He exhibits no tenderness.  Neurological: He is alert and oriented to person, place, and time. No cranial nerve deficit.  Skin: Skin is warm and dry. No rash noted. He is not diaphoretic. No erythema.  Psychiatric: Affect normal.    Hospital Course by problem list: Kelcy Baeten is a 48 y.o. male unassigned patient with PMH HTN who presents to the ED with a chief complaint of shortness of breath.  1. Progressive dyspnea with volume overload, 2/2 acute combined systolic and diastolic CHF - Patient presented with one-week of progressive dyspnea, orthopnea, and leg swelling. Pro-BNP was elevated at 647.8. Chest x-ray showed possible interstitial edema, cardiomegaly, and pulmonary vascular congestion. He had no prior history of heart failure, but has uncontrolled hypertension as well as a family history of coronary artery disease at a young age. We ruled out MI with serial troponins and EKGs. Symptoms and CXR improved with IV lasix. 2D echo showed severely reduced systolic function, EF 96-28%, Severe global hypokinesis with regional variation - there appears to be more pronounced inferolateral hypokinesis to akinesis. History favored long-standing uncontrolled hypertension as the cause. He underwent cardiac catheterization to rule out ischemia, it was negative for coronary artery disease. We started the patient on a beta blocker, Imdur, hydralazine, lisinopril, aspirin, low-dose potassium supplementation, and Lasix 40 mg by mouth twice a day. He diuresed -5.7L total since admission. Weight decreased from 272 to 259. He is agreeable to following up in our clinic going forward. Cardiology also recommended he followup with Dr. Verl Blalock, a heart failure doctor at the [facility] and [facility]. I instructed the patient to walk-in to this clinic after his [facility] appointment. I also called and left a voicemail with his information for them to call him about an appointment. Care management  met with the patient and provided him with a match letter for medication assistance.  2. HTN - Elevated on admission, 170s/100s. We started the patient on a beta blocker, Imdur, hydralazine, lisinopril, and Lasix 40 mg by mouth. He was normotensive at discharge.  3. HLD - Lipid panel below. CVD 10-year risk is 24%. He merits a high-dose statin. We started lovastatin 40 mg daily as this is on the [employer] $4 list.  Lipid Panel    Component  Value  Date/Time    CHOL  201*  03/05/2014 0530    TRIG  181*  03/05/2014 0530    HDL  29*  03/05/2014 0530    CHOLHDL  6.9  03/05/2014 0530    VLDL  36  03/05/2014 0530    LDLCALC  136*  03/05/2014 0530     4. CKD stage 2 - Cr ~1.3, GFR 82. Creatinine  was stable status post cath. Repeat BMP as outpatient.  Creatinine, Ser  Date Value Ref Range Status  03/05/2014 1.29  0.50 - 1.35 mg/dL Final  03/04/2014 1.33  0.50 - 1.35 mg/dL Final  03/03/2014 1.28  0.50 - 1.35 mg/dL Final  03/02/2014 1.20  0.50 - 1.35 mg/dL Final    5. Hypokalemia - Patient developed mild hypokalemia on Lasix. We repleted as needed. Patient was discharged on low-dose potassium supplementation.  6. DM2 - His A1C was 6.5. Patient was counseled on lifestyle and diet modification. He met with the nutritionist while he was here. Please arrange for him to meet with [facility] in the outpatient clinic.  Glucose-Capillary   Date  Value  Ref Range  Status   03/05/2014  106*  70 - 99 mg/dL  Final   03/05/2014  116*  70 - 99 mg/dL  Final   03/05/2014  120*  70 - 99 mg/dL  Final   03/05/2014  140*  70 - 99 mg/dL  Final   03/04/2014  165*  70 - 99 mg/dL  Final    7. Preventative care - Flu shot and pneumococcal vaccine given.  8. Sleeping apneic episodes - Noted on exam in the mornings. He would benefit from a sleep study.   Discharge Vitals:   BP 126/61  Pulse 90  Temp(Src) 98.4 F (36.9 C) (Oral)  Resp 16  Ht 5' 10" (1.778 m)  Wt 259 lb 11.2 oz (117.799 kg)  BMI 37.26 kg/m2  SpO2  97%  Discharge Labs:  Results for orders placed during the hospital encounter of 03/02/14 (from the past 24 hour(s))  GLUCOSE, CAPILLARY     Status: None   Collection Time    03/04/14  5:31 PM      Result Value Ref Range   Glucose-Capillary 93  70 - 99 mg/dL  GLUCOSE, CAPILLARY     Status: Abnormal   Collection Time    03/04/14  8:09 PM      Result Value Ref Range   Glucose-Capillary 165 (*) 70 - 99 mg/dL   Comment 1 Notify RN     Comment 2 Documented in Chart    GLUCOSE, CAPILLARY     Status: Abnormal   Collection Time    03/05/14 12:05 AM      Result Value Ref Range   Glucose-Capillary 140 (*) 70 - 99 mg/dL   Comment 1 Notify RN     Comment 2 Documented in Chart    GLUCOSE, CAPILLARY     Status: Abnormal   Collection Time    03/05/14  4:10 AM      Result Value Ref Range   Glucose-Capillary 120 (*) 70 - 99 mg/dL   Comment 1 Notify RN     Comment 2 Documented in Chart    BASIC METABOLIC PANEL     Status: Abnormal   Collection Time    03/05/14  5:30 AM      Result Value Ref Range   Sodium 139  137 - 147 mEq/L   Potassium 3.5 (*) 3.7 - 5.3 mEq/L   Chloride 98  96 - 112 mEq/L   CO2 27  19 - 32 mEq/L   Glucose, Bld 110 (*) 70 - 99 mg/dL   BUN 13  6 - 23 mg/dL   Creatinine, Ser 1.29  0.50 - 1.35 mg/dL   Calcium 9.3  8.4 - 10.5 mg/dL   GFR calc non Af Amer 65 (*) >90 mL/min   GFR  calc Af Amer 75 (*) >90 mL/min  LIPID PANEL     Status: Abnormal   Collection Time    03/05/14  5:30 AM      Result Value Ref Range   Cholesterol 201 (*) 0 - 200 mg/dL   Triglycerides 181 (*) <150 mg/dL   HDL 29 (*) >39 mg/dL   Total CHOL/HDL Ratio 6.9     VLDL 36  0 - 40 mg/dL   LDL Cholesterol 136 (*) 0 - 99 mg/dL  GLUCOSE, CAPILLARY     Status: Abnormal   Collection Time    03/05/14  7:03 AM      Result Value Ref Range   Glucose-Capillary 116 (*) 70 - 99 mg/dL  GLUCOSE, CAPILLARY     Status: Abnormal   Collection Time    03/05/14 11:09 AM      Result Value Ref Range    Glucose-Capillary 106 (*) 70 - 99 mg/dL    Signed: Lesly Dukes, MD 03/05/2014, 3:09 PM   Time Spent on Discharge: 35 minutes Services Ordered on Discharge: None Equipment Ordered on Discharge: None

## 2014-03-03 NOTE — Progress Notes (Signed)
Subjective: Patient seen and examined at the bedside this morning. Symptoms have improved with IV Lasix, he says he slept better overnight. He is still short of breath and belly breathing. Denies chest pain.  Objective: Vital signs in last 24 hours: Filed Vitals:   03/02/14 2225 03/03/14 0055 03/03/14 0417 03/03/14 0947  BP: 158/99 180/85 179/77   Pulse: 89 84 89   Temp:   98.1 F (36.7 C)   TempSrc:   Oral   Resp: 18 18 20    Height:      Weight:   269 lb 2.9 oz (122.1 kg)   SpO2: 99% 98% 99% 99%   Weight change:   Intake/Output Summary (Last 24 hours) at 03/03/14 1043 Last data filed at 03/03/14 1607  Gross per 24 hour  Intake   1383 ml  Output   3625 ml  Net  -2242 ml   Physical Exam  Constitutional: He is oriented to person, place, and time and well-developed, well-nourished, and in no distress.  Obese.  HENT:  Head: Normocephalic and atraumatic.  Mouth/Throat: Oropharynx is clear and moist.  Eyes: Conjunctivae and EOM are normal. Pupils are equal, round, and reactive to light.  Neck: Normal range of motion. Neck supple. No JVD (No obvious JVD, but exam limited by body habitus) present.  Cardiovascular: Normal rate, regular rhythm and normal heart sounds. Exam reveals no gallop and no friction rub.  No murmur heard.  Pulmonary/Chest: Effort normal. No respiratory distress. He has no wheezes. He has rales (Fine bibasilar crackles heard bilaterally). He exhibits no tenderness.  Abdominal: Soft. Bowel sounds are normal. He exhibits no distension. There is no tenderness.  Musculoskeletal: Normal range of motion. He exhibits edema (2+ pitting pretibial edema). He exhibits no tenderness.  Neurological: He is alert and oriented to person, place, and time. No cranial nerve deficit.  Skin: Skin is warm and dry. No rash noted. He is not diaphoretic. No erythema.  Psychiatric: Affect normal.   Lab Results: Basic Metabolic Panel:  Recent Labs Lab 03/02/14 0915  03/03/14 0355  NA 141 144  K 3.8 3.6*  CL 103 102  CO2 24 27  GLUCOSE 133* 113*  BUN 12 12  CREATININE 1.20 1.28  CALCIUM 9.2 9.3   Liver Function Tests: No results found for this basename: AST, ALT, ALKPHOS, BILITOT, PROT, ALBUMIN,  in the last 168 hours No results found for this basename: LIPASE, AMYLASE,  in the last 168 hours No results found for this basename: AMMONIA,  in the last 168 hours CBC:  Recent Labs Lab 03/02/14 0915  WBC 4.4  NEUTROABS 2.1  HGB 14.4  HCT 42.2  MCV 87.2  PLT 219   Cardiac Enzymes:  Recent Labs Lab 03/02/14 0920 03/02/14 1459 03/02/14 2136  TROPONINI <0.30 <0.30 <0.30   BNP:  Recent Labs Lab 03/02/14 0920  PROBNP 647.8*   D-Dimer: No results found for this basename: DDIMER,  in the last 168 hours CBG:  Recent Labs Lab 03/02/14 1604 03/02/14 2029 03/03/14 0027 03/03/14 0409 03/03/14 0750  GLUCAP 157* 128* 106* 108* 118*   Hemoglobin A1C:  Recent Labs Lab 03/02/14 1458  HGBA1C 6.5*   Fasting Lipid Panel: No results found for this basename: CHOL, HDL, LDLCALC, TRIG, CHOLHDL, LDLDIRECT,  in the last 168 hours Thyroid Function Tests:  Recent Labs Lab 03/02/14 1458  TSH 1.478   Coagulation: No results found for this basename: LABPROT, INR,  in the last 168 hours Anemia Panel: No results found  for this basename: VITAMINB12, FOLATE, FERRITIN, TIBC, IRON, RETICCTPCT,  in the last 168 hours Urine Drug Screen: Drugs of Abuse     Component Value Date/Time   LABOPIA NONE DETECTED 03/02/2014 [place] 03/02/2014 1359   LABBENZ NONE DETECTED 03/02/2014 1359   AMPHETMU NONE DETECTED 03/02/2014 1359   THCU NONE DETECTED 03/02/2014 1359   LABBARB NONE DETECTED 03/02/2014 1359    Alcohol Level: No results found for this basename: ETH,  in the last 168 hours Urinalysis: No results found for this basename: COLORURINE, APPERANCEUR, LABSPEC, PHURINE, GLUCOSEU, HGBUR, BILIRUBINUR, KETONESUR, PROTEINUR,  UROBILINOGEN, NITRITE, LEUKOCYTESUR,  in the last 168 hours   Micro Results: Recent Results (from the past 240 hour(s))  CULTURE, BLOOD (ROUTINE X 2)     Status: None   Collection Time    03/02/14 11:30 AM      Result Value Ref Range Status   Specimen Description BLOOD RIGHT ARM   Final   Special Requests BOTTLES DRAWN AEROBIC AND ANAEROBIC 10CC   Final   Culture  Setup Time     Final   Value: 03/02/2014 16:05     Performed at [facility]   Culture     Final   Value:        BLOOD CULTURE RECEIVED NO GROWTH TO DATE CULTURE WILL BE HELD FOR 5 DAYS BEFORE ISSUING A FINAL NEGATIVE REPORT     Performed at [facility]   Report Status PENDING   Incomplete  CULTURE, BLOOD (ROUTINE X 2)     Status: None   Collection Time    03/02/14 11:39 AM      Result Value Ref Range Status   Specimen Description BLOOD RIGHT HAND   Final   Special Requests BOTTLES DRAWN AEROBIC ONLY 6CC   Final   Culture  Setup Time     Final   Value: 03/02/2014 16:05     Performed at [facility]   Culture     Final   Value:        BLOOD CULTURE RECEIVED NO GROWTH TO DATE CULTURE WILL BE HELD FOR 5 DAYS BEFORE ISSUING A FINAL NEGATIVE REPORT     Performed at [facility]   Report Status PENDING   Incomplete   Studies/Results: Dg Chest 2 View  03/03/2014   CLINICAL DATA:  Follow-up pulmonary edema  EXAM: CHEST  2 VIEW  COMPARISON:  DG CHEST 1V PORT dated 03/02/2014  FINDINGS: The lungs are well-expanded. The pulmonary interstitium has become less prominent. There remains increased interstitial density bilaterally however. The cardiac silhouette is normal in size. The pulmonary vascularity is prominent centrally. There is no pleural effusion. There is mild tortuosity of the descending thoracic aorta.  IMPRESSION: The findings are consistent with improving pulmonary interstitial edema.   Electronically Signed   By: David  Martinique   On: 03/03/2014 08:24   Dg Chest Portable 1 View  03/02/2014    CLINICAL DATA:  48 year old male with shortness of breath  EXAM: PORTABLE CHEST - 1 VIEW  COMPARISON:  None.  FINDINGS: Upper limits normal heart size noted.  Mild pulmonary vascular congestion is noted.  Mild interstitial prominence is identified.  There is no evidence of focal airspace disease, suspicious pulmonary nodule/mass, pleural effusion, or pneumothorax. No acute bony abnormalities are identified.  IMPRESSION: Interstitial prominence which may represent interstitial infection or interstitial edema.  Upper limits normal heart size and mild pulmonary vascular congestion.   Electronically  Signed   By: Hassan Rowan M.D.   On: 03/02/2014 09:51   Medications: I have reviewed the patient's current medications. Scheduled Meds: . azithromycin  250 mg Oral Daily  . cefTRIAXone (ROCEPHIN)  IV  1 g Intravenous Once  . cefTRIAXone (ROCEPHIN)  IV  2 g Intravenous Q24H  . enoxaparin (LOVENOX) injection  40 mg Subcutaneous Q24H  . furosemide  40 mg Intravenous BID  . influenza vac split quadrivalent PF  0.5 mL Intramuscular Tomorrow-1000  . ipratropium-albuterol  3 mL Nebulization Q4H  . pneumococcal 23 valent vaccine  0.5 mL Intramuscular Tomorrow-1000  . potassium chloride  40 mEq Oral Once  . sodium chloride  3 mL Intravenous Q12H   Continuous Infusions:  PRN Meds:.acetaminophen, acetaminophen, hydrALAZINE, promethazine, promethazine  Assessment/Plan: Matthew [name] is a 48 y.o. male unassigned patient with PMH HTN who presents to the ED with a chief complaint of shortness of breath.   #Progressive dyspnea with volume overload, rule out new onset heart failure - Patient presents with one-week of progressive dyspnea, orthopnea, and leg swelling. Troponins were negative x3, repeat EKG unchanged. TSH 1.478 (wnl). UDS negative. HIV NR. AVSS, he is saturating 99% on 1L [place] with no documented desaturations on room air. We are still awaiting 2D echo to confirm the diagnosis. Repeat chest x-ray showed findings  consistent with improving pulmonary interstitial edema. - Follow up 2D echo  - Continue Lasix 40 mg IV twice a day  - Continue CAP coverage with azithromycin and ceftriaxone IV until 2D echo results, strep pneumo urine antigen was negative - Strict ins and outs > -2.29L since admission - Daily weights > 272 > 269 - Heart healthy/carb modified diet  - Follow up legionella urine antigen (in process) - Follow up blood cultures x2, NGTD - Tylenol when necessary pain or fever  - Phenergan when necessary for nausea (given slightly prolonged QTc)  - BMP in am   #HTN - BP 170s/70s currently. Previously on Atenolol, but noncompliant at home.  - Hydralazine 5 mg every 4 hours when necessary for systolic blood pressure >195, DBP >110 - Will add most appropriate BP meds tomorrow based on 2D echo results (ACE-I, BB, etc.)   #CKD stage 2 - Cr 1.28, GFR 82.  - Continue to monitor with daily BMP   Creatinine, Ser  Date Value Ref Range Status  03/03/2014 1.28  0.50 - 1.35 mg/dL Final  03/02/2014 1.20  0.50 - 1.35 mg/dL Final    #Hypokalemia - K 3.6 s/p IV Lasix. - K-dur 52meq x1  #DM2 - A1C 6.5. - CBG monitoring every 4 hours  - Will counsel on lifestyle and diet modification  Glucose-Capillary  Date Value Ref Range Status  03/03/2014 118* 70 - 99 mg/dL Final  03/03/2014 108* 70 - 99 mg/dL Final  03/03/2014 106* 70 - 99 mg/dL Final  03/02/2014 128* 70 - 99 mg/dL Final  03/02/2014 157* 70 - 99 mg/dL Final    #DVT PPX - Lovenox subq   Dispo: Disposition is deferred at this time, awaiting improvement of current medical problems. Anticipated discharge in approximately 1-3 day(s).   The patient does have a current PCP [facility]) and does not need an [facility] hospital follow-up appointment after discharge.   The patient does not have transportation limitations that hinder transportation to clinic appointments.   .Services Needed at time of discharge: Y = Yes, Blank = No PT:   OT:   RN:    Equipment:   Other:  LOS: 1 day   Lesly Dukes, MD 03/03/2014, 10:43 AM  Lesly Dukes, MD  [email or web address] Pager # [phone] Office # [phone]

## 2014-03-03 NOTE — Progress Notes (Signed)
*  PRELIMINARY RESULTS* Echocardiogram 2D Echocardiogram has been performed.  [name] 03/03/2014, 12:03 PM 

## 2014-03-03 NOTE — H&P (Signed)
INTERNAL MEDICINE TEACHING SERVICE Attending Admission Note  Date: 03/03/2014  Patient name: Matthew [name]  Medical record number: [id number]  Date of birth: [birth year]    I have seen and evaluated [name] and discussed their care with the Residency Team.  48 yr old man with pmhx HTN who presented with SOB. He admits to hx of orthopnea that has required him to sleep in a recliner. He admits to LE edema. He admits to recent cough of productive white sputum. He states he has not seen his PCP in over a year and has not taken his BP meds in months. On exam, Filed Vitals:   03/03/14 0417  BP: 179/77  Pulse: 89  Temp: 98.1 F (36.7 C)  Resp: 20   GEN: AAOx3, NAD CV: S1S2, no m/r/g, RRR PULM: distant sounds, but faint crackles at bases. ABD/GI: obese, NT, +BS. LE: 1+ pitting edema. 2/4 pulses.  CXR shows evidence of likely interstitial edema. His proBNP is 647. Trop neg x 3.  He received IV lasix in the ED and admits he felt slightly better overnight.   I agree that he likely has underlying HF from uncontrolled HTN. I would watch his I/Os, continue IV lasix 40 mg IV bid for now, and obtain a TTE. Will need to then control HTN as outpatient, agents will be chosen based on TTE results.  Dominic Pea, DO, [facility] 03/03/2014, 11:25 AM

## 2014-03-03 NOTE — Consult Note (Signed)
CARDIOLOGY CONSULT NOTE   Patient ID: Matthew [name] MRN: [id number], DOB/AGE: [birth year]   Admit date: 03/02/2014 Date of Consult: 03/03/2014  Primary Physician: No PCP Per Patient Primary Cardiologist: New to    Pt. Profile  48 y/o male w/o a prior cardiac hx who presented with progressive dyspnea and HTN and has been found to have a newly dx cardiomyopathy.  Problem List  Past Medical History  Diagnosis Date  . Hypertension   . Cardiomyopathy     a. 02/2014 Echo: EF 25-30%, sev glob HK with inferolat HK->AK, od conc LVH, Gr 2 DD, Mild MR, sev dil LA.  Marland Kitchen Acute combined systolic and diastolic CHF, NYHA class 3     a. 02/2014 Echo: EF 25-30%.  . Tobacco abuse   . Morbid obesity     Past Surgical History  Procedure Laterality Date  . Cataract extraction Right      Allergies  No Known Allergies  HPI   48 y/o male w/o prior cardiac hx.  He does have a h/o HTN and was previously on atenolol, but ran out and has been off for several months.  He smokes about 1/4 of a pack of cigarettes/day and has a FH of CAD with his father dying from an MI @ the age of 68.  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  He was in his USOH until approximately one week ago when he began to experience mild dyspnea, cough, and nasal discharge. He thought he was coming down with a viral infection and began to take over-the-counter cold agents like Robitussin and NyQuil. He says normally he would quickly recover from a cold by using these agents however in this case, his dyspnea progressed to occurring with minimal activity. Further, he began to experience nightly orthopnea and over the past 4 days or so has been sleeping in a chair. Over the same period of time, he has noted mild lower extremity edema though denies dizziness, syncope, or early satiety. Yesterday, he was markedly dyspneic after awakening and says he can barely get his breath or concentrate. As result, he presented to  the [facility] where he was hypertensive with a blood pressure of 164/104. He was noted to have significant volume overload on exam and his chest x-ray showed interstitial edema with question of interstitial infection. ProBNP was 647.8. He was seen by the internal medicine team and subsequently admitted for management of heart failure and possible community-acquired pneumonia. He was placed on IV diuretics as well as Rocephin and Zithromax. With diuresis, his weight is down about 3 pounds and he has had marked improvement in his dyspnea and a complete resolution of his orthopnea. An echocardiogram was carried out this afternoon revealing an EF of 25-30% with severe global hypokinesis and inferolateral hypokinesis to akinesis. We have been asked to evaluate. He denies any prior history of chest pain or pressure.  Inpatient Medications  . enoxaparin (LOVENOX) injection  40 mg Subcutaneous Q24H  . furosemide  40 mg Intravenous BID  . potassium chloride  40 mEq Oral Once  . sodium chloride  3 mL Intravenous Q12H    Family History Family History  Problem Relation Age of Onset  . Heart attack Father     died @ 11  . Cervical cancer Mother     died @ 64     Social History History   Social History  . Marital Status: Married  Spouse Name: N/A    Number of Children: N/A  . Years of Education: N/A   Occupational History  . Not on file.   Social History Main Topics  . Smoking status: Current Some Day Smoker -- 0.25 packs/day for 20 years    Types: Cigarettes  . Smokeless tobacco: Never Used  . Alcohol Use: No  . Drug Use: No  . Sexual Activity: Yes   Other Topics Concern  . Not on file   Social History Narrative   Lives with wife and 2 young children in [place].  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    Review of Systems  General:  No chills, fever, night sweats or weight changes.  Cardiovascular:  No chest pain, +++ dyspnea on exertion, +++ edema, +++  orthopnea, no palpitations, paroxysmal nocturnal dyspnea. Dermatological: No rash, lesions/masses Respiratory: No cough, +++ dyspnea Urologic: No hematuria, dysuria Abdominal:   No nausea, vomiting, diarrhea, bright red blood per rectum, melena, or hematemesis Neurologic:  No visual changes, wkns, changes in mental status. All other systems reviewed and are otherwise negative except as noted above.  Physical Exam  Blood pressure 166/104, pulse 83, temperature 98 F (36.7 C), temperature source Oral, resp. rate 20, height 5\' 10"  (1.778 m), weight 269 lb 2.9 oz (122.1 kg), SpO2 100.00%.  General: Pleasant, NAD Psych: Normal affect. Neuro: Alert and oriented X 3. Moves all extremities spontaneously. HEENT: Normal  Neck: Supple without bruits.  JVP to jaw. Lungs:  Resp regular and unlabored, few basilar crackles. Heart: RRR + s3, no murmurs. Abdomen: Firm protuberant, non-tender, BS + x 4.  Extremities: No clubbing, cyanosis, 1+ bilat LE edema. DP/PT/Radials 2+ and equal bilaterally.  Labs   Recent Labs  03/02/14 0920 03/02/14 1459 03/02/14 2136  TROPONINI <0.30 <0.30 <0.30   Lab Results  Component Value Date   WBC 4.4 03/02/2014   HGB 14.4 03/02/2014   HCT 42.2 03/02/2014   MCV 87.2 03/02/2014   PLT 219 03/02/2014     Recent Labs Lab 03/03/14 0355  NA 144  K 3.6*  CL 102  CO2 27  BUN 12  CREATININE 1.28  CALCIUM 9.3  GLUCOSE 113*   Radiology/Studies  Dg Chest 2 View  03/03/2014   CLINICAL DATA:  Follow-up pulmonary edema  EXAM: CHEST  2 VIEW  COMPARISON:  DG CHEST 1V PORT dated 03/02/2014  FINDINGS: The lungs are well-expanded. The pulmonary interstitium has become less prominent. There remains increased interstitial density bilaterally however. The cardiac silhouette is normal in size. The pulmonary vascularity is prominent centrally. There is no pleural effusion. There is mild tortuosity of the descending thoracic aorta.  IMPRESSION: The findings are consistent with  improving pulmonary interstitial edema.   Electronically Signed   By: David  Martinique   On: 03/03/2014 08:24   Dg Chest Portable 1 View  03/02/2014   CLINICAL DATA:  48 year old male with shortness of breath  EXAM: PORTABLE CHEST - 1 VIEW  COMPARISON:  None.  FINDINGS: Upper limits normal heart size noted.  Mild pulmonary vascular congestion is noted.  Mild interstitial prominence is identified.  There is no evidence of focal airspace disease, suspicious pulmonary nodule/mass, pleural effusion, or pneumothorax. No acute bony abnormalities are identified.  IMPRESSION: Interstitial prominence which may represent interstitial infection or interstitial edema.  Upper limits normal heart size and mild pulmonary vascular congestion.   Electronically Signed   By: Hassan Rowan M.D.   On: 03/02/2014 09:51  2D Echocardiogram 3.18.2015  Study Conclusions  - Left ventricle: The cavity size was mildly dilated. There   was moderate concentric hypertrophy. Systolic function was   severely reduced. The estimated ejection fraction was in   the range of 25% to 30%. Severe global hypokinesis with   regional variation - there appears to be more prounounced   inferolateral hypokinesis to akinesis. Doppler parameters   are consistent with pseudonormal left ventricular   relaxation (grade2 diastolic dysfunction). The E/A ratio   is 2.8. The E/e' ratio is >15, suggesting markedly   elevated LV filling pressure. - Mitral valve: Mildly thickened leaflets . Mild   regurgitation. - Left atrium: Severely dilated (30 cm2). - Inferior vena cava: The vessel was normal in size; the   respirophasic diameter changes were in the normal range (=   50%); findings are consistent with normal central venous   pressure. - Pericardium, extracardiac: There was no pericardial   effusion. _____________   ECG  RSR, 64, nonspecific t changes, prolonged qt.  ASSESSMENT AND PLAN  1.  Acute combined systolic and diastolic CHF:  Pt  presented yesterday with a 1 wk h/o exertional dyspnea, lower extremity edema, and orthopnea and has now been found to have a newly dx cardiomyopathy in the setting of systolic and diastolic dysfxn.  He is  -1.6 L since admission and Wt is down 2 lbs, however he continues to have significant volume overload.  We will titrate his lasix to 80mg  IV bid.  Add carvedilol 3.125mg  BID along with hydralazine and nitrates.  Ultimately, will plan to add an ACEI but with mild renal insuff and need for diagnostic cath, will defer ACEI initiation until after cath.  We discussed the indication for diagnostic catheterization to r/o obstructive CAD as a cause of his cardiomyopathy.  The patient understands that risks include but are not limited to stroke (1 in 1000), death (1 in 22), kidney failure [usually temporary] (1 in 500), bleeding (1 in 200), allergic reaction [possibly serious] (1 in 200), and agrees to proceed.  Will schedule for tomorrow.  2.  HTN:  Poorly controlled.  As above, adding bb, hydral, nitrate, with plan to add ACEI later if renal fxn stable post-cath.  3.  Lipid: unknown. Check lipids/lft's.  4.  Hypokalemia:  Supp.  5.  Tob Abuse:  Cessation advised.  6.  ? CAP:  WBC nl. Afebrile.  Clinically, think his presentation is entirely CHF.  Abx d/c'd by primary team.  Signed, Murray Hodgkins, NP 03/03/2014, 7:08 PM

## 2014-03-04 ENCOUNTER — Encounter (HOSPITAL_COMMUNITY)
Admission: EM | Disposition: A | Discharge status: Home / Self Care | Payer: Self-pay | Source: Home / Self Care | Attending: Internal Medicine

## 2014-03-04 DIAGNOSIS — I428 Other cardiomyopathies: Secondary | ICD-10-CM

## 2014-03-04 DIAGNOSIS — I509 Heart failure, unspecified: Secondary | ICD-10-CM

## 2014-03-04 DIAGNOSIS — I5041 Acute combined systolic (congestive) and diastolic (congestive) heart failure: Principal | ICD-10-CM

## 2014-03-04 HISTORY — PX: LEFT HEART CATHETERIZATION WITH CORONARY ANGIOGRAM: SHX5451

## 2014-03-04 LAB — GLUCOSE, CAPILLARY
Glucose-Capillary: 102 mg/dL — ABNORMAL HIGH (ref 70–99)
Glucose-Capillary: 111 mg/dL — ABNORMAL HIGH (ref 70–99)
Glucose-Capillary: 112 mg/dL — ABNORMAL HIGH (ref 70–99)
Glucose-Capillary: 165 mg/dL — ABNORMAL HIGH (ref 70–99)
Glucose-Capillary: 171 mg/dL — ABNORMAL HIGH (ref 70–99)
Glucose-Capillary: 93 mg/dL (ref 70–99)

## 2014-03-04 LAB — BASIC METABOLIC PANEL
BUN: 14 mg/dL (ref 6–23)
CO2: 26 mEq/L (ref 19–32)
Calcium: 9.3 mg/dL (ref 8.4–10.5)
Chloride: 99 mEq/L (ref 96–112)
Creatinine, Ser: 1.33 mg/dL (ref 0.50–1.35)
GFR calc Af Amer: 72 mL/min — ABNORMAL LOW (ref >90)
GFR calc non Af Amer: 62 mL/min — ABNORMAL LOW (ref >90)
Glucose, Bld: 100 mg/dL — ABNORMAL HIGH (ref 70–99)
Potassium: 3.6 mEq/L — ABNORMAL LOW (ref 3.7–5.3)
Sodium: 139 mEq/L (ref 137–147)

## 2014-03-04 LAB — PROTIME-INR
INR: 1.08 (ref 0.00–1.49)
Prothrombin Time: 13.8 seconds (ref 11.6–15.2)

## 2014-03-04 SURGERY — LEFT HEART CATHETERIZATION WITH CORONARY ANGIOGRAM
Anesthesia: LOCAL

## 2014-03-04 MED ORDER — DIAZEPAM 5 MG PO TABS: 10.0000 mg | ORAL_TABLET | ORAL | Status: DC

## 2014-03-04 MED ORDER — MIDAZOLAM HCL 2 MG/2ML IJ SOLN
INTRAMUSCULAR | Status: AC
  Filled 2014-03-04: qty 2

## 2014-03-04 MED ORDER — SODIUM CHLORIDE 0.9 % IJ SOLN: 3.0000 mL | INTRAMUSCULAR | Status: DC | PRN

## 2014-03-04 MED ORDER — NITROGLYCERIN 0.2 MG/ML ON CALL CATH LAB
INTRAVENOUS | Status: AC
  Filled 2014-03-04: qty 1

## 2014-03-04 MED ORDER — SODIUM CHLORIDE 0.9 % IJ SOLN
3.0000 mL | Freq: Two times a day (BID) | INTRAMUSCULAR | Status: DC
  Administered 2014-03-04: 3 mL via INTRAVENOUS

## 2014-03-04 MED ORDER — HEPARIN (PORCINE) IN NACL 2-0.9 UNIT/ML-% IJ SOLN
INTRAMUSCULAR | Status: AC
  Filled 2014-03-04: qty 1500

## 2014-03-04 MED ORDER — FENTANYL CITRATE 0.05 MG/ML IJ SOLN
INTRAMUSCULAR | Status: AC
  Filled 2014-03-04: qty 2

## 2014-03-04 MED ORDER — SODIUM CHLORIDE 0.9 % IV SOLN
INTRAVENOUS | Status: AC
  Administered 2014-03-04: 18:00:00 via INTRAVENOUS

## 2014-03-04 MED ORDER — SODIUM CHLORIDE 0.9 % IV SOLN: 250.0000 mL | INTRAVENOUS | Status: DC | PRN

## 2014-03-04 MED ORDER — ASPIRIN 81 MG PO CHEW
81.0000 mg | CHEWABLE_TABLET | ORAL | Status: AC
  Administered 2014-03-04: 81 mg via ORAL
  Filled 2014-03-04: qty 1

## 2014-03-04 MED ORDER — SODIUM CHLORIDE 0.9 % IV SOLN: INTRAVENOUS | Status: DC

## 2014-03-04 MED ORDER — POTASSIUM CHLORIDE CRYS ER 20 MEQ PO TBCR
40.0000 meq | EXTENDED_RELEASE_TABLET | Freq: Once | ORAL | Status: AC
  Administered 2014-03-04: 40 meq via ORAL
  Filled 2014-03-04: qty 2

## 2014-03-04 MED ORDER — LIDOCAINE HCL (PF) 1 % IJ SOLN
INTRAMUSCULAR | Status: AC
  Filled 2014-03-04: qty 30

## 2014-03-04 MED ORDER — HYDRALAZINE HCL 20 MG/ML IJ SOLN
INTRAMUSCULAR | Status: AC
  Filled 2014-03-04: qty 1

## 2014-03-04 NOTE — H&P (View-Only) (Signed)
 Patient Name: Matthew [name] Date of Encounter: 03/04/2014     Principal Problem:   Acute combined systolic and diastolic heart failure Active Problems:   Pneumonia   Volume overload   Hypertension   Congestive dilated cardiomyopathy   Tobacco abuse   Morbid obesity    SUBJECTIVE  The patient feels well today.  His breathing has improved.  His peripheral edema has resolved.  He is awaiting cardiac catheterization.  Good diuresis overnight with 6 pounds additional weight loss.  CURRENT MEDS . aspirin  81 mg Oral Daily  . carvedilol  3.125 mg Oral BID WC  . diazepam  10 mg Oral On Call  . enoxaparin (LOVENOX) injection  40 mg Subcutaneous Q24H  . furosemide  80 mg Intravenous BID  . hydrALAZINE  10 mg Oral 3 times per day  . isosorbide mononitrate  15 mg Oral Daily  . potassium chloride  40 mEq Oral Once  . sodium chloride  3 mL Intravenous Q12H  . sodium chloride  3 mL Intravenous Q12H    OBJECTIVE  Filed Vitals:   03/03/14 2035 03/04/14 0430 03/04/14 1022 03/04/14 1036  BP: 157/111 130/79 121/76 145/101  Pulse: 83  76 81  Temp:  98.1 F (36.7 C) 97.4 F (36.3 C) 98 F (36.7 C)  TempSrc:  Oral Oral Oral  Resp:  18 18 18  Height:      Weight:  263 lb 3.7 oz (119.4 kg)    SpO2:  98% 98% 96%    Intake/Output Summary (Last 24 hours) at 03/04/14 1115 Last data filed at 03/04/14 1052  Gross per 24 hour  Intake   1013 ml  Output   3725 ml  Net  -2712 ml   Filed Weights   03/02/14 1413 03/03/14 0417 03/04/14 0430  Weight: 272 lb 9.6 oz (123.651 kg) 269 lb 2.9 oz (122.1 kg) 263 lb 3.7 oz (119.4 kg)    PHYSICAL EXAM  General: Pleasant, NAD. Neuro: Alert and oriented X 3. Moves all extremities spontaneously. Psych: Normal affect. HEENT:  Normal  Neck: Supple without bruits or JVD. Lungs:  Resp regular and unlabored, CTA. Heart: RRR no murmur gallop or rub Abdomen: Soft, non-tender, non-distended, BS + x 4.  Extremities: No clubbing, cyanosis or edema.  DP/PT/Radials 2+ and equal bilaterally.  Accessory Clinical Findings  CBC  Recent Labs  03/02/14 0915  WBC 4.4  NEUTROABS 2.1  HGB 14.4  HCT 42.2  MCV 87.2  PLT 219   Basic Metabolic Panel  Recent Labs  03/03/14 0355 03/04/14 0440  NA 144 139  K 3.6* 3.6*  CL 102 99  CO2 27 26  GLUCOSE 113* 100*  BUN 12 14  CREATININE 1.28 1.33  CALCIUM 9.3 9.3   Liver Function Tests No results found for this basename: AST, ALT, ALKPHOS, BILITOT, PROT, ALBUMIN,  in the last 72 hours No results found for this basename: LIPASE, AMYLASE,  in the last 72 hours Cardiac Enzymes  Recent Labs  03/02/14 0920 03/02/14 1459 03/02/14 2136  TROPONINI <0.30 <0.30 <0.30   BNP No components found with this basename: POCBNP,  D-Dimer No results found for this basename: DDIMER,  in the last 72 hours Hemoglobin A1C  Recent Labs  03/02/14 1458  HGBA1C 6.5*   Fasting Lipid Panel No results found for this basename: CHOL, HDL, LDLCALC, TRIG, CHOLHDL, LDLDIRECT,  in the last 72 hours Thyroid Function Tests  Recent Labs  03/02/14 1458  TSH 1.478    TELE    Normal sinus rhythm  ECG    Radiology/Studies  Dg Chest 2 View  03/03/2014   CLINICAL DATA:  Follow-up pulmonary edema  EXAM: CHEST  2 VIEW  COMPARISON:  DG CHEST 1V PORT dated 03/02/2014  FINDINGS: The lungs are well-expanded. The pulmonary interstitium has become less prominent. There remains increased interstitial density bilaterally however. The cardiac silhouette is normal in size. The pulmonary vascularity is prominent centrally. There is no pleural effusion. There is mild tortuosity of the descending thoracic aorta.  IMPRESSION: The findings are consistent with improving pulmonary interstitial edema.   Electronically Signed   By: David  Martinique   On: 03/03/2014 08:24   Dg Chest Portable 1 View  03/02/2014   CLINICAL DATA:  48 year old male with shortness of breath  EXAM: PORTABLE CHEST - 1 VIEW  COMPARISON:  None.  FINDINGS:  Upper limits normal heart size noted.  Mild pulmonary vascular congestion is noted.  Mild interstitial prominence is identified.  There is no evidence of focal airspace disease, suspicious pulmonary nodule/mass, pleural effusion, or pneumothorax. No acute bony abnormalities are identified.  IMPRESSION: Interstitial prominence which may represent interstitial infection or interstitial edema.  Upper limits normal heart size and mild pulmonary vascular congestion.   Electronically Signed   By: Hassan Rowan M.D.   On: 03/02/2014 09:51    ASSESSMENT AND PLAN  1. Acute combined systolic and diastolic CHF:  2. HTN: Improved since admission.  Renal Function stable.  3. Lipid: Status pending 4. Hypokalemia: Supp.  5. Tob Abuse: Cessation advised.   Plan: Continue aspirin carvedilol, Lasix, nitrates, hydralazine.  Cardiac catheterization today.  Add ACE inhibitor post cath if renal function stable tomorrow   Signed, Darlin Coco MD

## 2014-03-04 NOTE — Consult Note (Addendum)
Heart Failure Navigator Consult Note  Presentation: Matthew [name] is a 48 y.o. Male  patient with PMH HTN who presents to the ED with a chief complaint of shortness of breath.  Matthew [name] presents with a week long history of progressive shortness of breath. It is worse on exertion and especially when lying flat. He has had to sleep in a recliner for the past week. Dyspnea is associated with a cough productive of white sputum, as well as leg swelling. He has also had runny nose and itchy eyes along with subjective fevers, so he chalked his symptoms off to a cold and took some Robitussin, which did not help. He has not tried any other medications. He denies chest pain, nausea, vomiting, diarrhea. He denies weight gain.  This has never happened before. He denies a history of heart failure or coronary artery disease. He has never had a stress test. He does have a family history of heart disease; his father had heart attack in his 80s.  He is a patient at the [facility], but has not seen a provider there in over a year. I called them and they do not have records before July 2014 due to a change in ownership.  He used to take Atenolol for his blood pressure, but has not been compliant in several months. I called his pharmacy, last refill was 10/14 for Atenolol 50mg . He thinks he was on a diuretic in the past, but the pharmacy has no record of this. He cannot remember the name.  He smokes one pack of cigarettes per week x10 years. Denies alcohol or drug use, including cocaine.    Past Medical History  Diagnosis Date  . Hypertension   . Cardiomyopathy     a. 02/2014 Echo: EF 25-30%, sev glob HK with inferolat HK->AK, mod conc LVH, Gr 2 DD, Mild MR, sev dil LA.  Marland Kitchen Acute combined systolic and diastolic CHF, NYHA class 3     a. 02/2014 Echo: EF 25-30%.  . Tobacco abuse   . Morbid obesity     History   Social History  . Marital Status: Married    Spouse Name: N/A    Number of Children: N/A  .  Years of Education: N/A   Social History Main Topics  . Smoking status: Current Some Day Smoker -- 0.25 packs/day for 20 years    Types: Cigarettes  . Smokeless tobacco: Never Used  . Alcohol Use: No  . Drug Use: No  . Sexual Activity: Yes   Other Topics Concern  . None   Social History Narrative   Lives with wife and 2 young children in [place].  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    ECHO:Study Conclusions - 03/03/2014  - Left ventricle: The cavity size was mildly dilated. There was moderate concentric hypertrophy. Systolic function was severely reduced. The estimated ejection fraction was in the range of 25% to 30%. Severe global hypokinesis with regional variation - there appears to be more prounounced inferolateral hypokinesis to akinesis. Doppler parameters are consistent with pseudonormal left ventricular relaxation (grade2 diastolic dysfunction). The E/A ratio is 2.8. The E/e' ratio is >15, suggesting markedly elevated LV filling pressure. - Mitral valve: Mildly thickened leaflets . Mild regurgitation. - Left atrium: Severely dilated (30 cm2). - Inferior vena cava: The vessel was normal in size; the respirophasic diameter changes were in the normal range (= 50%); findings are consistent with normal central venous pressure. - Pericardium,  extracardiac: There was no pericardial effusion.    BNP    Component Value Date/Time   PROBNP 647.8* 03/02/2014 0920    Education Assessment and Provision:  Detailed education and instructions provided on heart failure disease management including the following:  Signs and symptoms of Heart Failure When to call the physician Importance of daily weights Low sodium diet  Fluid restriction Medication management Anticipated future follow-up appointments  Patient education given on each of the above topics.  Patient acknowledges understanding and acceptance of all instructions.  Matthew [name] dozed  off and on while I was educating.  His wife was present and listened intently.  He is for cardiac cath today and was upset that he cannot eat.  He admits to smoking and says that "after all this" he will not smoke any longer.  Education Materials:  "Living Better With Heart Failure" Booklet, Daily Weight Tracker Tool and Heart Failure Educational Video.   High Risk Criteria for Readmission and/or Poor Patient Outcomes   EF <30%- Yes  2nd admission in 6 months- No  Difficult social situation- No  Demonstrates medication noncompliance- Yes, prior to admission did not take HTN meds.    Barriers of Care:  Compliance and understanding /knowledge of medical condition  Discharge Planning:  Will need structured frequent follow-up as well as possible medication assistance.  He and his wife will need continued HF education reinforcement.  Would recommend appointment at [facility] clinic within 1 week of discharge because he has had issues with copays in the past.  Notes to Patient's Outpatient Care Team for Continued Management in the Community:  New HF diagnosis- needs continued HF education and compliance reinforcement.

## 2014-03-04 NOTE — Progress Notes (Signed)
  Date: 03/04/2014  Patient name: Matthew [name]  Medical record number: [id number]  Date of birth: 02-Sep-1966   This patient has been seen and the plan of care was discussed with the house staff. Please see their note for complete details. I concur with their findings with the following additions/corrections: Feels better this morning. Adequate diuresis. TTE results noted. He has systolic/diastolic failure. Suspect HTN related CM, but will undergo LHC to look for ISCM.  Dominic Pea, DO, [facility] 03/04/2014, 2:12 PM

## 2014-03-04 NOTE — CV Procedure (Signed)
      Cardiac Catheterization Operative Report  Matthew [name] [id number] 3/19/20154:40 PM No PCP Per Patient  Procedure Performed:  1. Left Heart Catheterization 2. Selective Coronary Angiography 3. Right Heart Catheterization  Operator: Lauree Chandler, MD  Indication:   48 yo male with history of obesity, tobacco abuse, new cardiomyopathy, acute systolic CHF.                               Procedure Details: The risks, benefits, complications, treatment options, and expected outcomes were discussed with the patient. The patient and/or family concurred with the proposed plan, giving informed consent. The patient was brought to the cath lab after IV hydration was begun and oral premedication was given. The patient was further sedated with Versed and Fentanyl. The right groin was prepped and draped in the usual manner. Using the modified Seldinger access technique, a 5 French sheath was placed in the right femoral artery. A 7 French sheath was inserted into the right femoral vein. A balloon tipped catheter was used to perform a right heart catheterization. Standard diagnostic catheters were used to perform selective coronary angiography. A pigtail catheter was used to measure left ventricular pressures. There were no immediate complications. The patient was taken to the recovery area in stable condition.   Hemodynamic Findings: Ao: 144/99                LV: 153/20/32 RA: 8              RV: 35/8/11 PA:  41/19 (mean 33)     PCWP:  14 Fick Cardiac Output: 4.3 L/min Fick Cardiac Index: 1.84 L/min/m2 Central Aortic Saturation: 98% Pulmonary Artery Saturation: 61%  Angiographic Findings:  Left main: No obstructive disease.  Left Anterior Descending Artery: Large caliber vessel that courses to the apex. There is a large caliber diagonal vessel. No obstructive disease.   Circumflex Artery: Large caliber vessel with moderate caliber early obtuse marginal branch, smaller caliber  posterolateral branch. No obstructive disease.   Right Coronary Artery: Large caliber dominant vessel with no obstructive disease.   Left Ventricular Angiogram: Deferred.   Impression: 1. No angiographic evidence of CAD 2. Mildly elevated filling pressures.   Recommendations: Continue current medical management.        Complications:  None; patient tolerated the procedure well.

## 2014-03-04 NOTE — Progress Notes (Signed)
Patient is Matthew [name] and arrived to the unit post-cardiac cath around 1740. He had no c/o pain and no signs of distress. Right groin was assessed and was a level 0. No sign of bleeding. He was placed on frequent VS. He is still on 2 L [place] of oxygen. Was told by cardiac cath RN's that patient bedrest would be complete in 3 hours. Patient tolerated clear liquid diet well and diet was advanced per on call MD. Pt.'s goin site was assessed again by two RN's during bedside shift report and was documented.

## 2014-03-04 NOTE — Progress Notes (Signed)
Patient Name: Matthew [name] Date of Encounter: 03/04/2014     Principal Problem:   Acute combined systolic and diastolic heart failure Active Problems:   Pneumonia   Volume overload   Hypertension   Congestive dilated cardiomyopathy   Tobacco abuse   Morbid obesity    SUBJECTIVE  The patient feels well today.  His breathing has improved.  His peripheral edema has resolved.  He is awaiting cardiac catheterization.  Good diuresis overnight with 6 pounds additional weight loss.  CURRENT MEDS . aspirin  81 mg Oral Daily  . carvedilol  3.125 mg Oral BID WC  . diazepam  10 mg Oral On Call  . enoxaparin (LOVENOX) injection  40 mg Subcutaneous Q24H  . furosemide  80 mg Intravenous BID  . hydrALAZINE  10 mg Oral 3 times per day  . isosorbide mononitrate  15 mg Oral Daily  . potassium chloride  40 mEq Oral Once  . sodium chloride  3 mL Intravenous Q12H  . sodium chloride  3 mL Intravenous Q12H    OBJECTIVE  Filed Vitals:   03/03/14 2035 03/04/14 0430 03/04/14 1022 03/04/14 1036  BP: 157/111 130/79 121/76 145/101  Pulse: 83  76 81  Temp:  98.1 F (36.7 C) 97.4 F (36.3 C) 98 F (36.7 C)  TempSrc:  Oral Oral Oral  Resp:  18 18 18   Height:      Weight:  263 lb 3.7 oz (119.4 kg)    SpO2:  98% 98% 96%    Intake/Output Summary (Last 24 hours) at 03/04/14 1115 Last data filed at 03/04/14 1052  Gross per 24 hour  Intake   1013 ml  Output   3725 ml  Net  -2712 ml   Filed Weights   03/02/14 1413 03/03/14 0417 03/04/14 0430  Weight: 272 lb 9.6 oz (123.651 kg) 269 lb 2.9 oz (122.1 kg) 263 lb 3.7 oz (119.4 kg)    PHYSICAL EXAM  General: Pleasant, NAD. Neuro: Alert and oriented X 3. Moves all extremities spontaneously. Psych: Normal affect. HEENT:  Normal  Neck: Supple without bruits or JVD. Lungs:  Resp regular and unlabored, CTA. Heart: RRR no murmur gallop or rub Abdomen: Soft, non-tender, non-distended, BS + x 4.  Extremities: No clubbing, cyanosis or edema.  DP/PT/Radials 2+ and equal bilaterally.  Accessory Clinical Findings  CBC  Recent Labs  03/02/14 0915  WBC 4.4  NEUTROABS 2.1  HGB 14.4  HCT 42.2  MCV 87.2  PLT 161   Basic Metabolic Panel  Recent Labs  03/03/14 0355 03/04/14 0440  NA 144 139  K 3.6* 3.6*  CL 102 99  CO2 27 26  GLUCOSE 113* 100*  BUN 12 14  CREATININE 1.28 1.33  CALCIUM 9.3 9.3   Liver Function Tests No results found for this basename: AST, ALT, ALKPHOS, BILITOT, PROT, ALBUMIN,  in the last 72 hours No results found for this basename: LIPASE, AMYLASE,  in the last 72 hours Cardiac Enzymes  Recent Labs  03/02/14 0920 03/02/14 1459 03/02/14 2136  TROPONINI <0.30 <0.30 <0.30   BNP No components found with this basename: POCBNP,  D-Dimer No results found for this basename: DDIMER,  in the last 72 hours Hemoglobin A1C  Recent Labs  03/02/14 1458  HGBA1C 6.5*   Fasting Lipid Panel No results found for this basename: CHOL, HDL, LDLCALC, TRIG, CHOLHDL, LDLDIRECT,  in the last 72 hours Thyroid Function Tests  Recent Labs  03/02/14 1458  TSH 1.478    TELE  Normal sinus rhythm  ECG    Radiology/Studies  Dg Chest 2 View  03/03/2014   CLINICAL DATA:  Follow-up pulmonary edema  EXAM: CHEST  2 VIEW  COMPARISON:  DG CHEST 1V PORT dated 03/02/2014  FINDINGS: The lungs are well-expanded. The pulmonary interstitium has become less prominent. There remains increased interstitial density bilaterally however. The cardiac silhouette is normal in size. The pulmonary vascularity is prominent centrally. There is no pleural effusion. There is mild tortuosity of the descending thoracic aorta.  IMPRESSION: The findings are consistent with improving pulmonary interstitial edema.   Electronically Signed   By: David  Martinique   On: 03/03/2014 08:24   Dg Chest Portable 1 View  03/02/2014   CLINICAL DATA:  48 year old male with shortness of breath  EXAM: PORTABLE CHEST - 1 VIEW  COMPARISON:  None.  FINDINGS:  Upper limits normal heart size noted.  Mild pulmonary vascular congestion is noted.  Mild interstitial prominence is identified.  There is no evidence of focal airspace disease, suspicious pulmonary nodule/mass, pleural effusion, or pneumothorax. No acute bony abnormalities are identified.  IMPRESSION: Interstitial prominence which may represent interstitial infection or interstitial edema.  Upper limits normal heart size and mild pulmonary vascular congestion.   Electronically Signed   By: Hassan Rowan M.D.   On: 03/02/2014 09:51    ASSESSMENT AND PLAN  1. Acute combined systolic and diastolic CHF:  2. HTN: Improved since admission.  Renal Function stable.  3. Lipid: Status pending 4. Hypokalemia: Supp.  5. Tob Abuse: Cessation advised.   Plan: Continue aspirin carvedilol, Lasix, nitrates, hydralazine.  Cardiac catheterization today.  Add ACE inhibitor post cath if renal function stable tomorrow   Signed, Darlin Coco MD

## 2014-03-04 NOTE — Progress Notes (Signed)
Subjective: Patient seen and examined at the bedside this morning. Symptoms have improved with IV Lasix, he says he is no longer short of breath. Denies chest pain. Endorses a mild headache. I explained what the cardiac cath will entail and he is amenable. He is hungry, but I reinforced he needs to stay NPO for the procedure.  Telemetry showed some sinus bradycardia overnight, rate 40-50s, asymptomatic.  Objective: Vital signs in last 24 hours: Filed Vitals:   03/03/14 2035 03/04/14 0430 03/04/14 1022 03/04/14 1036  BP: 157/111 130/79 121/76 145/101  Pulse: 83  76 81  Temp:  98.1 F (36.7 C) 97.4 F (36.3 C) 98 F (36.7 C)  TempSrc:  Oral Oral Oral  Resp:  18 18 18   Height:      Weight:  263 lb 3.7 oz (119.4 kg)    SpO2:  98% 98% 96%   Weight change: -7 lb 12.3 oz (-3.525 kg)  Intake/Output Summary (Last 24 hours) at 03/04/14 1131 Last data filed at 03/04/14 1052  Gross per 24 hour  Intake   1013 ml  Output   3725 ml  Net  -2712 ml   Physical Exam  Constitutional: He is oriented to person, place, and time and well-developed, well-nourished, and in no distress.  Obese.  HENT:  Head: Normocephalic and atraumatic.  Mouth/Throat: Oropharynx is clear and moist.  Eyes: Conjunctivae and EOM are normal. Pupils are equal, round, and reactive to light.  Neck: Normal range of motion. Neck supple. No JVD present.  Cardiovascular: Normal rate, regular rhythm and normal heart sounds. Exam reveals no gallop and no friction rub.  No murmur heard.  Pulmonary/Chest: Effort normal. No respiratory distress. He has no wheezes. No rales today. He exhibits no tenderness.  Abdominal: Soft. Bowel sounds are normal. He exhibits no distension. There is no tenderness.  Musculoskeletal: Normal range of motion. Peripheral edema has improved. He exhibits no tenderness.  Neurological: He is alert and oriented to person, place, and time. No cranial nerve deficit.  Skin: Skin is warm and dry. No rash  noted. He is not diaphoretic. No erythema.  Psychiatric: Affect normal.   Lab Results: Basic Metabolic Panel:  Recent Labs Lab 03/03/14 0355 03/04/14 0440  NA 144 139  K 3.6* 3.6*  CL 102 99  CO2 27 26  GLUCOSE 113* 100*  BUN 12 14  CREATININE 1.28 1.33  CALCIUM 9.3 9.3   Liver Function Tests: No results found for this basename: AST, ALT, ALKPHOS, BILITOT, PROT, ALBUMIN,  in the last 168 hours No results found for this basename: LIPASE, AMYLASE,  in the last 168 hours No results found for this basename: AMMONIA,  in the last 168 hours CBC:  Recent Labs Lab 03/02/14 0915  WBC 4.4  NEUTROABS 2.1  HGB 14.4  HCT 42.2  MCV 87.2  PLT 219   Cardiac Enzymes:  Recent Labs Lab 03/02/14 0920 03/02/14 1459 03/02/14 2136  TROPONINI <0.30 <0.30 <0.30   BNP:  Recent Labs Lab 03/02/14 0920  PROBNP 647.8*   D-Dimer: No results found for this basename: DDIMER,  in the last 168 hours CBG:  Recent Labs Lab 03/03/14 1513 03/03/14 1711 03/03/14 2041 03/04/14 0028 03/04/14 0432 03/04/14 0810  GLUCAP 107* 101* 89 102* 112* 111*   Hemoglobin A1C:  Recent Labs Lab 03/02/14 1458  HGBA1C 6.5*   Fasting Lipid Panel: No results found for this basename: CHOL, HDL, LDLCALC, TRIG, CHOLHDL, LDLDIRECT,  in the last 168 hours Thyroid Function Tests:  Recent Labs Lab 03/02/14 1458  TSH 1.478   Coagulation:  Recent Labs Lab 03/04/14 0408  LABPROT 13.8  INR 1.08   Anemia Panel: No results found for this basename: VITAMINB12, FOLATE, FERRITIN, TIBC, IRON, RETICCTPCT,  in the last 168 hours Urine Drug Screen: Drugs of Abuse     Component Value Date/Time   LABOPIA NONE DETECTED 03/02/2014 1359   COCAINSCRNUR NONE DETECTED 03/02/2014 1359   LABBENZ NONE DETECTED 03/02/2014 1359   AMPHETMU NONE DETECTED 03/02/2014 1359   THCU NONE DETECTED 03/02/2014 1359   LABBARB NONE DETECTED 03/02/2014 1359    Alcohol Level: No results found for this basename: ETH,  in the  last 168 hours Urinalysis: No results found for this basename: COLORURINE, APPERANCEUR, LABSPEC, PHURINE, GLUCOSEU, HGBUR, BILIRUBINUR, KETONESUR, PROTEINUR, UROBILINOGEN, NITRITE, LEUKOCYTESUR,  in the last 168 hours   Micro Results: Recent Results (from the past 240 hour(s))  CULTURE, BLOOD (ROUTINE X 2)     Status: None   Collection Time    03/02/14 11:30 AM      Result Value Ref Range Status   Specimen Description BLOOD RIGHT ARM   Final   Special Requests BOTTLES DRAWN AEROBIC AND ANAEROBIC 10CC   Final   Culture  Setup Time     Final   Value: 03/02/2014 16:05     Performed at [facility]   Culture     Final   Value:        BLOOD CULTURE RECEIVED NO GROWTH TO DATE CULTURE WILL BE HELD FOR 5 DAYS BEFORE ISSUING A FINAL NEGATIVE REPORT     Performed at [facility]   Report Status PENDING   Incomplete  CULTURE, BLOOD (ROUTINE X 2)     Status: None   Collection Time    03/02/14 11:39 AM      Result Value Ref Range Status   Specimen Description BLOOD RIGHT HAND   Final   Special Requests BOTTLES DRAWN AEROBIC ONLY 6CC   Final   Culture  Setup Time     Final   Value: 03/02/2014 16:05     Performed at [facility]   Culture     Final   Value:        BLOOD CULTURE RECEIVED NO GROWTH TO DATE CULTURE WILL BE HELD FOR 5 DAYS BEFORE ISSUING A FINAL NEGATIVE REPORT     Performed at [facility]   Report Status PENDING   Incomplete   Studies/Results: Dg Chest 2 View  03/03/2014   CLINICAL DATA:  Follow-up pulmonary edema  EXAM: CHEST  2 VIEW  COMPARISON:  DG CHEST 1V PORT dated 03/02/2014  FINDINGS: The lungs are well-expanded. The pulmonary interstitium has become less prominent. There remains increased interstitial density bilaterally however. The cardiac silhouette is normal in size. The pulmonary vascularity is prominent centrally. There is no pleural effusion. There is mild tortuosity of the descending thoracic aorta.  IMPRESSION: The findings are  consistent with improving pulmonary interstitial edema.   Electronically Signed   By: Matthew  [name]   On: 03/03/2014 08:24   Medications: I have reviewed the patient's current medications. Scheduled Meds: . aspirin  81 mg Oral Daily  . carvedilol  3.125 mg Oral BID WC  . diazepam  10 mg Oral On Call  . enoxaparin (LOVENOX) injection  40 mg Subcutaneous Q24H  . furosemide  80 mg Intravenous BID  . hydrALAZINE  10 mg Oral 3 times per day  . isosorbide mononitrate  15 mg Oral Daily  . potassium chloride  40 mEq Oral Once  . sodium chloride  3 mL Intravenous Q12H  . sodium chloride  3 mL Intravenous Q12H   Continuous Infusions: . [START ON 03/05/2014] sodium chloride     PRN Meds:.sodium chloride, acetaminophen, acetaminophen, hydrALAZINE, ipratropium-albuterol, promethazine, promethazine, sodium chloride  Assessment/Plan: Matthew [name] is a 48 y.o. male unassigned patient with PMH HTN who presents to the ED with a chief complaint of shortness of breath.   #Progressive dyspnea with volume overload, 2/2 acute combined systolic and diastolic CHF - Symptoms are improving with IV lasix. 2D echo showed severely reduced systolic function, EF 25-95%, Severe global hypokinesis with regional variation - there appears to be more pronounced inferolateral hypokinesis to akinesis. History favors long-standing uncontrolled hypertension as the cause. Cardiology is on board and he will undergo cardiac catheterization today to rule out ischemia and coronary artery disease.  - Appreciate cardiology recs - Appreciate heart failure recs, he will need structured frequent follow-up after discharge - Aspirin 81 mg daily - Carvedilol 3.125 mg po twice a day with meals - Lasix 80 mg IV twice a day - Imdur 15 mg po daily - Hydralazine 10 mg po every 8 hours - Add ACE inhibitor after cath if renal function stable tomorrow - Discontinue antibiotics, this is not pneumonia - Strict ins and outs > -4.9L since  admission - Daily weights > 272 > 269 > 263 - NPO for procedure, then heart healthy/carb modified diet  - Tylenol when necessary pain or fever  - Phenergan when necessary for nausea (given slightly prolonged QTc)  - BMP, CBC in am   #HTN - BP 140s/100s currently. - Carvedilol, Lasix, nitrates, hydralazine as above - Possible addition of ACE inhibitor as above  #CKD stage 2 - Cr 1.3, GFR 82.  - Continue to monitor with daily BMP   Creatinine, Ser  Date Value Ref Range Status  03/04/2014 1.33  0.50 - 1.35 mg/dL Final  03/03/2014 1.28  0.50 - 1.35 mg/dL Final    #Hypokalemia - K 3.6. - K-dur 16meq x1 - Continue to monitor  #DM2 - A1C 6.5. - CBG monitoring every 4 hours  - Will counsel on lifestyle and diet modification  Glucose-Capillary  Date Value Ref Range Status  03/04/2014 111* 70 - 99 mg/dL Final  03/04/2014 112* 70 - 99 mg/dL Final  03/04/2014 102* 70 - 99 mg/dL Final  03/03/2014 89  70 - 99 mg/dL Final  03/03/2014 101* 70 - 99 mg/dL Final    #Preventative care - Flu shot and pneumococcal vaccine given  #DVT PPX - Lovenox subq   Dispo: Disposition is deferred at this time, awaiting improvement of current medical problems. Anticipated discharge in approximately 1-3 day(s).   The patient does have a current PCP [facility]) and does not need an [facility] hospital follow-up appointment after discharge.   The patient does not have transportation limitations that hinder transportation to clinic appointments.   .Services Needed at time of discharge: Y = Yes, Blank = No PT:   OT:   RN:   Equipment:   Other:     LOS: 2 days   Lesly Dukes, MD 03/04/2014, 11:31 AM  Lesly Dukes, MD  [email or web address] Pager # [phone] Office # [phone]

## 2014-03-04 NOTE — Interval H&P Note (Signed)
History and Physical Interval Note:  03/04/2014 3:49 PM  Matthew [name]  has presented today for cardiac cath with the diagnosis of CHF.  The various methods of treatment have been discussed with the patient and family. After consideration of risks, benefits and other options for treatment, the patient has consented to  Procedure(s): LEFT HEART CATHETERIZATION WITH CORONARY ANGIOGRAM (N/A) as a surgical intervention .  The patient's history has been reviewed, patient examined, no change in status, stable for surgery.  I have reviewed the patient's chart and labs.  Questions were answered to the patient's satisfaction.    Cath Lab Visit (complete for each Cath Lab visit)  Clinical Evaluation Leading to the Procedure:   ACS: no  Non-ACS:    Anginal Classification: CCS II  Anti-ischemic medical therapy: Minimal Therapy (1 class of medications)  Non-Invasive Test Results: No non-invasive testing performed  Prior CABG: No previous CABG        Emmalin Jaquess

## 2014-03-04 NOTE — Progress Notes (Signed)
Pt. Alert and oriented this am. No s/s of distress or discomfort noted. Pt. Resting in bed quietly. Wife at bedside. RN notified by CMT that pt. Had episodes of non-sustained junctional bradycardia during the night. PT. Assessed and asymptomatic. On call MD paged. RN will continue to monitor pt. For changes in condition. Matthew [name], Matthew [name]

## 2014-03-05 DIAGNOSIS — I5021 Acute systolic (congestive) heart failure: Secondary | ICD-10-CM

## 2014-03-05 DIAGNOSIS — G4730 Sleep apnea, unspecified: Secondary | ICD-10-CM

## 2014-03-05 DIAGNOSIS — I428 Other cardiomyopathies: Secondary | ICD-10-CM

## 2014-03-05 LAB — POCT I-STAT 3, VENOUS BLOOD GAS (G3P V)
Bicarbonate: 26.3 mEq/L — ABNORMAL HIGH (ref 20.0–24.0)
O2 Saturation: 61 %
TCO2: 28 mmol/L (ref 0–100)
pCO2, Ven: 48.8 mmHg (ref 45.0–50.0)
pH, Ven: 7.34 — ABNORMAL HIGH (ref 7.250–7.300)
pO2, Ven: 34 mmHg (ref 30.0–45.0)

## 2014-03-05 LAB — GLUCOSE, CAPILLARY
Glucose-Capillary: 106 mg/dL — ABNORMAL HIGH (ref 70–99)
Glucose-Capillary: 116 mg/dL — ABNORMAL HIGH (ref 70–99)
Glucose-Capillary: 120 mg/dL — ABNORMAL HIGH (ref 70–99)
Glucose-Capillary: 140 mg/dL — ABNORMAL HIGH (ref 70–99)

## 2014-03-05 LAB — POCT I-STAT 3, ART BLOOD GAS (G3+)
Acid-Base Excess: 3 mmol/L — ABNORMAL HIGH (ref 0.0–2.0)
Bicarbonate: 25.5 mEq/L — ABNORMAL HIGH (ref 20.0–24.0)
O2 Saturation: 98 %
TCO2: 26 mmol/L (ref 0–100)
pCO2 arterial: 31.3 mmHg — ABNORMAL LOW (ref 35.0–45.0)
pH, Arterial: 7.519 — ABNORMAL HIGH (ref 7.350–7.450)
pO2, Arterial: 89 mmHg (ref 80.0–100.0)

## 2014-03-05 LAB — BASIC METABOLIC PANEL
BUN: 13 mg/dL (ref 6–23)
CO2: 27 mEq/L (ref 19–32)
Calcium: 9.3 mg/dL (ref 8.4–10.5)
Chloride: 98 mEq/L (ref 96–112)
Creatinine, Ser: 1.29 mg/dL (ref 0.50–1.35)
GFR calc Af Amer: 75 mL/min — ABNORMAL LOW (ref >90)
GFR calc non Af Amer: 65 mL/min — ABNORMAL LOW (ref >90)
Glucose, Bld: 110 mg/dL — ABNORMAL HIGH (ref 70–99)
Potassium: 3.5 mEq/L — ABNORMAL LOW (ref 3.7–5.3)
Sodium: 139 mEq/L (ref 137–147)

## 2014-03-05 LAB — LIPID PANEL
Cholesterol: 201 mg/dL — ABNORMAL HIGH (ref 0–200)
HDL: 29 mg/dL — ABNORMAL LOW (ref >39)
LDL Cholesterol: 136 mg/dL — ABNORMAL HIGH (ref 0–99)
Total CHOL/HDL Ratio: 6.9 RATIO
Triglycerides: 181 mg/dL — ABNORMAL HIGH (ref <150)
VLDL: 36 mg/dL (ref 0–40)

## 2014-03-05 MED ORDER — ISOSORBIDE MONONITRATE ER 30 MG PO TB24: 15.0000 mg | ORAL_TABLET | Freq: Every day | ORAL | Status: DC

## 2014-03-05 MED ORDER — LISINOPRIL 5 MG PO TABS
5.0000 mg | ORAL_TABLET | Freq: Every day | ORAL | Status: DC
  Administered 2014-03-05: 5 mg via ORAL
  Filled 2014-03-05: qty 1

## 2014-03-05 MED ORDER — LOVASTATIN 20 MG PO TABS: 40.0000 mg | ORAL_TABLET | Freq: Every day | ORAL | Status: DC

## 2014-03-05 MED ORDER — HYDRALAZINE HCL 10 MG PO TABS: 10.0000 mg | ORAL_TABLET | Freq: Three times a day (TID) | ORAL | Status: DC

## 2014-03-05 MED ORDER — FUROSEMIDE 40 MG PO TABS: 40.0000 mg | ORAL_TABLET | Freq: Two times a day (BID) | ORAL | Status: DC

## 2014-03-05 MED ORDER — ASPIRIN 81 MG PO CHEW: 81.0000 mg | CHEWABLE_TABLET | Freq: Every day | ORAL | Status: DC

## 2014-03-05 MED ORDER — POTASSIUM CHLORIDE ER 20 MEQ PO TBCR
20.0000 meq | EXTENDED_RELEASE_TABLET | Freq: Every day | ORAL | Status: DC
Start: 2014-03-05 — End: 2014-06-24

## 2014-03-05 MED ORDER — LISINOPRIL 5 MG PO TABS: 5.0000 mg | ORAL_TABLET | Freq: Every day | ORAL | Status: DC

## 2014-03-05 MED ORDER — FUROSEMIDE 40 MG PO TABS
40.0000 mg | ORAL_TABLET | Freq: Two times a day (BID) | ORAL | Status: DC
  Filled 2014-03-05 (×2): qty 1

## 2014-03-05 MED ORDER — CARVEDILOL 3.125 MG PO TABS: 3.1250 mg | ORAL_TABLET | Freq: Two times a day (BID) | ORAL | Status: DC

## 2014-03-05 MED ORDER — ATORVASTATIN CALCIUM 40 MG PO TABS
40.0000 mg | ORAL_TABLET | Freq: Every day | ORAL | Status: DC
  Filled 2014-03-05: qty 1

## 2014-03-05 MED ORDER — POTASSIUM CHLORIDE CRYS ER 20 MEQ PO TBCR
40.0000 meq | EXTENDED_RELEASE_TABLET | Freq: Once | ORAL | Status: AC
  Administered 2014-03-05: 40 meq via ORAL
  Filled 2014-03-05: qty 2

## 2014-03-05 NOTE — Progress Notes (Signed)
Patient has an appointment for next week with Dr. Doug Sou and April 1st at 10:45 am with Dr. Verl Blalock at his HF clinic inside the [facility].  Please encourage the patient and stress the importance in keeping these appointments.  Carole Binning RN, BSN, PCCN--Heart Failure Art therapist

## 2014-03-05 NOTE — Progress Notes (Signed)
  Date: 03/05/2014  Patient name: Randon Somera  Medical record number: [id number]  Date of birth: 1966/06/04   This patient has been seen and the plan of care was discussed with the house staff. Please see their note for complete details. I concur with their findings with the following additions/corrections: Feels well today. No CP. No SOB. RLE distal pulses intact, no bleeding at [facility] site and no bruit. S/P LHC, RHC. He has no CAD. He has NICM. Appreciate cards input. Will need medication assistance. Can add ACE-i on D/C with outpatient follow up in 2 weeks. Agree with current regimen of ASA, Lasix, Nitrate/hydralazine, carvedilol. Medically stable for D/C.  Dominic Pea, DO, [facility] 03/05/2014, 10:42 AM

## 2014-03-05 NOTE — Discharge Summary (Signed)
  Date: 03/05/2014  Patient name: Matthew [name]  Medical record number: [id number]  Date of birth: July 09, 1966   This patient has been seen and the plan of care was discussed with the house staff. Please see their note for complete details. I concur with their findings and plan.  Dominic Pea, DO, [facility] 03/05/2014, 3:29 PM

## 2014-03-05 NOTE — Plan of Care (Signed)
Problem: Food- and Nutrition-Related Knowledge Deficit (NB-1.1) Goal: Nutrition education Formal process to instruct or train a patient/client in a skill or to impart knowledge to help patients/clients voluntarily manage or modify food choices and eating behavior to maintain or improve health. Outcome: Completed/Met Date Met:  03/05/14 Nutrition Education Note  RD consulted for nutrition education regarding new onset CHF.  RD provided "Low Sodium Nutrition Therapy" handout from the Academy of [employer]. Reviewed patient's dietary recall. Provided examples on ways to decrease sodium intake in diet. Discouraged intake of processed foods and use of salt shaker. Encouraged fresh fruits and vegetables as well as whole grain sources of carbohydrates to maximize fiber intake.   RD discussed why it is important for patient to adhere to diet recommendations, and emphasized the role of fluids, foods to avoid, and importance of weighing self daily. Teach back method used.  RD also consulted for nutrition education regarding diabetes. Pt reports that he doesn't have DM.    Lab Results  Component Value Date    HGBA1C 6.5* 03/02/2014    RD provided "Balanced Plate". Discussed different food groups and their effects on blood sugar, emphasizing carbohydrate-containing foods. Provided list of carbohydrates and recommended serving sizes of common foods.  Discussed importance of controlled and consistent carbohydrate intake throughout the day. Provided examples of ways to balance meals/snacks and encouraged intake of high-fiber, whole grain complex carbohydrates. Teach back method used.  Expect fair compliance.  Body mass index is 37.26 kg/(m^2). Pt meets criteria for Obese Class I based on current BMI.  Current diet order is Heart Healthy/Carb Modified, patient is consuming approximately 100% of meals at this time. Labs and medications reviewed. No further nutrition interventions warranted at  this time. RD contact information provided. If additional nutrition issues arise, please re-consult RD.   Inda Coke MS, RD, LDN Inpatient Registered Dietitian Pager: [phone] After-hours pager: [phone]

## 2014-03-05 NOTE — Progress Notes (Signed)
Subjective: Patient seen and examined at the bedside this morning. Symptoms have improved with IV Lasix, he says he is no longer short of breath. Denies chest pain. He is happy his cath was normal and he is ready to go home. He is amenable to following up in the [facility] going forward.  Objective: Vital signs in last 24 hours: Filed Vitals:   03/04/14 2256 03/05/14 0058 03/05/14 0610 03/05/14 0716  BP: 140/84 142/87 154/93   Pulse: 97 85 84   Temp:  98 F (36.7 C) 98.4 F (36.9 C)   TempSrc:  Oral    Resp:  18 16   Height:      Weight:    259 lb 11.2 oz (117.799 kg)  SpO2:  96% 97%    Weight change:   Intake/Output Summary (Last 24 hours) at 03/05/14 1132 Last data filed at 03/05/14 1114  Gross per 24 hour  Intake   1960 ml  Output   3250 ml  Net  -1290 ml   Physical Exam  Constitutional: He is oriented to person, place, and time and well-developed, well-nourished, and in no distress.  Obese.  HENT:  Head: Normocephalic and atraumatic.  Mouth/Throat: Oropharynx is clear and moist.  Eyes: Conjunctivae and EOM are normal. Pupils are equal, round, and reactive to light.  Neck: Normal range of motion. Neck supple. No JVD present.  Cardiovascular: Normal rate, regular rhythm and normal heart sounds. Exam reveals no gallop and no friction rub.  No murmur heard.  Pulmonary/Chest: Effort normal. No respiratory distress. He has no wheezes. No rales today. He exhibits no tenderness.  Abdominal: Soft. Bowel sounds are normal. He exhibits no distension. There is no tenderness.  Musculoskeletal: Normal range of motion. Peripheral edema has improved. He exhibits no tenderness.  Neurological: He is alert and oriented to person, place, and time. No cranial nerve deficit.  Skin: Skin is warm and dry. No rash noted. He is not diaphoretic. No erythema. Bandage over right groin site. Psychiatric: Affect normal.   Lab Results: Basic Metabolic Panel:  Recent Labs Lab 03/04/14 0440  03/05/14 0530  NA 139 139  K 3.6* 3.5*  CL 99 98  CO2 26 27  GLUCOSE 100* 110*  BUN 14 13  CREATININE 1.33 1.29  CALCIUM 9.3 9.3   Liver Function Tests: No results found for this basename: AST, ALT, ALKPHOS, BILITOT, PROT, ALBUMIN,  in the last 168 hours No results found for this basename: LIPASE, AMYLASE,  in the last 168 hours No results found for this basename: AMMONIA,  in the last 168 hours CBC:  Recent Labs Lab 03/02/14 0915  WBC 4.4  NEUTROABS 2.1  HGB 14.4  HCT 42.2  MCV 87.2  PLT 219   Cardiac Enzymes:  Recent Labs Lab 03/02/14 0920 03/02/14 1459 03/02/14 2136  TROPONINI <0.30 <0.30 <0.30   BNP:  Recent Labs Lab 03/02/14 0920  PROBNP 647.8*   D-Dimer: No results found for this basename: DDIMER,  in the last 168 hours CBG:  Recent Labs Lab 03/04/14 1731 03/04/14 2009 03/05/14 0005 03/05/14 0410 03/05/14 0703 03/05/14 1109  GLUCAP 93 165* 140* 120* 116* 106*   Hemoglobin A1C:  Recent Labs Lab 03/02/14 1458  HGBA1C 6.5*   Fasting Lipid Panel:  Recent Labs Lab 03/05/14 0530  CHOL 201*  HDL 29*  LDLCALC 136*  TRIG 181*  CHOLHDL 6.9   Thyroid Function Tests:  Recent Labs Lab 03/02/14 1458  TSH 1.478   Coagulation:  Recent  Labs Lab 03/04/14 0408  LABPROT 13.8  INR 1.08   Anemia Panel: No results found for this basename: VITAMINB12, FOLATE, FERRITIN, TIBC, IRON, RETICCTPCT,  in the last 168 hours Urine Drug Screen: Drugs of Abuse     Component Value Date/Time   LABOPIA NONE DETECTED 03/02/2014 1359   COCAINSCRNUR NONE DETECTED 03/02/2014 1359   LABBENZ NONE DETECTED 03/02/2014 1359   AMPHETMU NONE DETECTED 03/02/2014 1359   THCU NONE DETECTED 03/02/2014 1359   LABBARB NONE DETECTED 03/02/2014 1359    Alcohol Level: No results found for this basename: ETH,  in the last 168 hours Urinalysis: No results found for this basename: COLORURINE, APPERANCEUR, LABSPEC, PHURINE, GLUCOSEU, HGBUR, BILIRUBINUR, KETONESUR,  PROTEINUR, UROBILINOGEN, NITRITE, LEUKOCYTESUR,  in the last 168 hours   Micro Results: Recent Results (from the past 240 hour(s))  CULTURE, BLOOD (ROUTINE X 2)     Status: None   Collection Time    03/02/14 11:30 AM      Result Value Ref Range Status   Specimen Description BLOOD RIGHT ARM   Final   Special Requests BOTTLES DRAWN AEROBIC AND ANAEROBIC 10CC   Final   Culture  Setup Time     Final   Value: 03/02/2014 16:05     Performed at [facility]   Culture     Final   Value:        BLOOD CULTURE RECEIVED NO GROWTH TO DATE CULTURE WILL BE HELD FOR 5 DAYS BEFORE ISSUING A FINAL NEGATIVE REPORT     Performed at [facility]   Report Status PENDING   Incomplete  CULTURE, BLOOD (ROUTINE X 2)     Status: None   Collection Time    03/02/14 11:39 AM      Result Value Ref Range Status   Specimen Description BLOOD RIGHT HAND   Final   Special Requests BOTTLES DRAWN AEROBIC ONLY [place]   Final   Culture  Setup Time     Final   Value: 03/02/2014 16:05     Performed at [facility]   Culture     Final   Value:        BLOOD CULTURE RECEIVED NO GROWTH TO DATE CULTURE WILL BE HELD FOR 5 DAYS BEFORE ISSUING A FINAL NEGATIVE REPORT     Performed at [facility]   Report Status PENDING   Incomplete   Studies/Results: No results found. Medications: I have reviewed the patient's current medications. Scheduled Meds: . aspirin  81 mg Oral Daily  . carvedilol  3.125 mg Oral BID WC  . furosemide  80 mg Intravenous BID  . hydrALAZINE  10 mg Oral 3 times per day  . isosorbide mononitrate  15 mg Oral Daily  . sodium chloride  3 mL Intravenous Q12H   Continuous Infusions:   PRN Meds:.acetaminophen, acetaminophen, hydrALAZINE, ipratropium-albuterol, promethazine, promethazine  Assessment/Plan: Matthew [name] is a 48 y.o. male unassigned patient with PMH HTN who presents to the ED with a chief complaint of shortness of breath.   #Progressive dyspnea with volume  overload, 2/2 acute combined systolic and diastolic CHF - Cath yesterday was negative for CAD. No complications. Renal function is stable, we will start an ACE-I.  - Appreciate cardiology recs - Appreciate heart failure recs, he will need structured frequent follow-up after discharge - Aspirin 81 mg daily - Carvedilol 3.125 mg po twice a day with meals - Adding lisinopril 5mg  daily - Lasix 80 mg IV twice a day, will  convert to PO prior to discharge - Imdur 15 mg po daily - Hydralazine 10 mg po every 8 hours - Strict ins and outs > -5.7L since admission - Daily weights > 272 > 269 > 263 > 259 - Heart healthy/carb modified diet  - Tylenol when necessary pain or fever  - Phenergan when necessary for nausea (given slightly prolonged QTc) - Social work/care management consult for [facility] letter - Medically stable for discharge with close [facility] and HF follow up  #HTN - BP 150s/90s currently. - Carvedilol, Lasix, nitrates, hydralazine. Ace-I as above  #HLD - CVD 10-year risk is 24%. He merits a high-dose statin. - Starting Lipitor 40mg  QHS with plans to titrate up to 80mg  as an outpatient if tolerated  Lipid Panel     Component Value Date/Time   CHOL 201* 03/05/2014 0530   TRIG 181* 03/05/2014 0530   HDL 29* 03/05/2014 0530   CHOLHDL 6.9 03/05/2014 0530   VLDL 36 03/05/2014 0530   LDLCALC 136* 03/05/2014 0530    #CKD stage 2 - Cr 1.3, GFR 82.  - Repeat BMP as outpatient   Creatinine, Ser  Date Value Ref Range Status  03/05/2014 1.29  0.50 - 1.35 mg/dL Final  03/04/2014 1.33  0.50 - 1.35 mg/dL Final    #Hypokalemia - K 3.5. - K-dur 26meq x1 - Continue to monitor  #DM2 - A1C 6.5. - CBG monitoring AC/QHS - Counseled on lifestyle and diet modification - He can meet with our diabetes educator in the clinic  Glucose-Capillary  Date Value Ref Range Status  03/05/2014 106* 70 - 99 mg/dL Final  03/05/2014 116* 70 - 99 mg/dL Final  03/05/2014 120* 70 - 99 mg/dL Final  03/05/2014 140* 70 - 99  mg/dL Final  03/04/2014 165* 70 - 99 mg/dL Final    #Preventative care - Flu shot and pneumococcal vaccine given  #DVT PPX - Lovenox subq   Dispo: Disposition is deferred at this time, awaiting improvement of current medical problems. Anticipated discharge in approximately 1-3 day(s).   The patient does have a current PCP [facility]) and does not need an [facility] hospital follow-up appointment after discharge.   The patient does not have transportation limitations that hinder transportation to clinic appointments.   .Services Needed at time of discharge: Y = Yes, Blank = No PT:   OT:   RN:   Equipment:   Other:     LOS: 3 days   Lesly Dukes, MD 03/05/2014, 11:32 AM  Lesly Dukes, MD  [email or web address] Pager # [phone] Office # [phone]

## 2014-03-05 NOTE — Progress Notes (Signed)
Patient Name: Matthew [name] Date of Encounter: 03/05/2014   Principal Problem:   Acute combined systolic and diastolic heart failure Active Problems:   NICM (nonischemic cardiomyopathy)   Hypertension   Tobacco abuse   Morbid obesity   Pneumonia   SUBJECTIVE Reclined in bed with mother at bedside. Pleasant and conversive. In NAD and reports ambulation without dyspnea and no orthopnea.   CURRENT MEDS . aspirin  81 mg Oral Daily  . carvedilol  3.125 mg Oral BID WC  . furosemide  80 mg Intravenous BID  . hydrALAZINE  10 mg Oral 3 times per day  . isosorbide mononitrate  15 mg Oral Daily  . sodium chloride  3 mL Intravenous Q12H    OBJECTIVE  Filed Vitals:   03/04/14 2256 03/05/14 0058 03/05/14 0610 03/05/14 0716  BP: 140/84 142/87 154/93   Pulse: 97 85 84   Temp:  98 F (36.7 C) 98.4 F (36.9 C)   TempSrc:  Oral    Resp:  18 16   Height:      Weight:    259 lb 11.2 oz (117.799 kg)  SpO2:  96% 97%     Intake/Output Summary (Last 24 hours) at 03/05/14 0934 Last data filed at 03/05/14 0900  Gross per 24 hour  Intake   3103 ml  Output   3150 ml  Net    -47 ml   Filed Weights   03/03/14 0417 03/04/14 0430 03/05/14 0716  Weight: 269 lb 2.9 oz (122.1 kg) 263 lb 3.7 oz (119.4 kg) 259 lb 11.2 oz (117.799 kg)    PHYSICAL EXAM  General: Pleasant, NAD. Neuro: Alert and oriented X 3. Moves all extremities spontaneously. Psych: Normal affect. HEENT:  [place], AT. Conjunctiva pink and moist. Sclera slightly icteric. No thyromegaly or nodules.     Neck: Supple without bruits or JVD. Carotid upstroke 2 +.  Lungs:  Resp regular and unlabored. Rm air. Diminished bilaterally.  Heart: Distant. RRR no s3, s4, or murmurs. Cap refill = 3 sec.  Abdomen: Protuberant. Soft, non-tender, non-distended, BS + x 4. No hepatosplenomegaly. No bruits.  Extremities: No clubbing, cyanosis or edema. DP/PT/Radials 2+ and equal bilaterally. Right fem cannulation site covered with clean dry dressing.  No apparent hematoma.   Accessory Clinical Findings  Basic Metabolic Panel  Recent Labs  03/04/14 0440 03/05/14 0530  NA 139 139  K 3.6* 3.5*  CL 99 98  CO2 26 27  GLUCOSE 100* 110*  BUN 14 13  CREATININE 1.33 1.29  CALCIUM 9.3 9.3   Cardiac Enzymes  Recent Labs  03/02/14 1459 03/02/14 2136  TROPONINI <0.30 <0.30   Hemoglobin A1C  Recent Labs  03/02/14 1458  HGBA1C 6.5*   Fasting Lipid Panel  Recent Labs  03/05/14 0530  CHOL 201*  HDL 29*  LDLCALC 136*  TRIG 181*  CHOLHDL 6.9   Thyroid Function Tests  Recent Labs  03/02/14 1458  TSH 1.478   TELE  NSR  ECG  03/03/2014: NSR with high voltage lateral, septal and anterior QRS with T wave abnormalities.    Radiology/Studies  Dg Chest 2 View  03/03/2014   CLINICAL DATA:  Follow-up pulmonary edema  EXAM: CHEST  2 VIEW  COMPARISON:  DG CHEST 1V PORT dated 03/02/2014  FINDINGS: The lungs are well-expanded. The pulmonary interstitium has become less prominent. There remains increased interstitial density bilaterally however. The cardiac silhouette is normal in size. The pulmonary vascularity is prominent centrally. There is no pleural effusion. There  is mild tortuosity of the descending thoracic aorta.  IMPRESSION: The findings are consistent with improving pulmonary interstitial edema.   Electronically Signed   By: David  Martinique   On: 03/03/2014 08:24   Dg Chest Portable 1 View  03/02/2014   CLINICAL DATA:  48 year old male with shortness of breath  EXAM: PORTABLE CHEST - 1 VIEW  COMPARISON:  None.  FINDINGS: Upper limits normal heart size noted.  Mild pulmonary vascular congestion is noted.  Mild interstitial prominence is identified.  There is no evidence of focal airspace disease, suspicious pulmonary nodule/mass, pleural effusion, or pneumothorax. No acute bony abnormalities are identified.  IMPRESSION: Interstitial prominence which may represent interstitial infection or interstitial edema.  Upper limits  normal heart size and mild pulmonary vascular congestion.   Electronically Signed   By: Hassan Rowan M.D.   On: 03/02/2014 09:51    ASSESSMENT AND PLAN 48 yr old male with new diagnosis of cardiomyopathy and systolic/diastolic HF (EF 49-67%) presented to [facility] complaining of DOE and 20 lb weight gain since January. Past medical HX includes HTN, obesity, and tobacco abuse. Patient went without his atenolol from January until this admission. Patient states he was unable to afford co-pay for office visit in order to receive a prescription refill. He is also diet non-compliant. Dr. Ellsworth Lennox located on [facility] is patient's PCP. Dr. Angelena Form performed cath (03/04/2014) which revealed absence of CAD with mildly elevated filling pressures (144/99, 153/20/32, 8, 35/8/11, 41/19 (mean 33), 14.). Labs reveal a mixed dyslipidemia, A1C 6.5, TSH 1.47 with normal renal function. Patient's weight today measures 259 lbs which is down from his admission weight of 273.    1.  NICM/Acute Systolic/Diastolic HF (59-16%), NYHA class III >  Continue coreg, imdur, ASA, convert lasix to PO (40 bid). Add Acei to hydral/nitrate. >will need kdur 20 daily. >  Continue tele >  Prior to discharge schedule office visit for  follow-up with Dr. Verl Blalock. This should assist with disease management due to current lack of insurance and financial status. Needs f/u w/in 7 days.  2. HTN > Continue hydralazine/nitrate/bb.  > Add on lisinopril 5 mg today.  Will need bmet in a week. > Diet counseling regarding Na+ intake   3. Tobacco abuse  > Provide smoking cessation support  4.  HL >Nl cors on cath.  He is not diabetic.  LDL < 190.  However, his 10 yr risk of a cardiac event is >30%.  Would add statin @ d/c.  Would go with prava in order to keep things affordable.  He will need f/u lipids/lft's in 6-8 wks.  Signed, Hulen Luster, [employer] STUDENT- NP  Patient is doing well post cath.  Agree with assessment and plan as noted above.  The patient  has diuresed well during this hospital stay with 12 pound weight loss.  Okay for discharge today from cardiac standpoint with recommendations as noted above.

## 2014-03-05 NOTE — Discharge Instructions (Signed)
It was a pleasure taking care of you. - You have congestive heart failure from high blood pressure. This is a condition where the heart has trouble pumping blood, so fluid backs up in your lungs and in your legs. - One of the treatments is a diuretic medication called Lasix. We have prescribed this for you. You also got this medicine through your IV in the hospital. - We have also started several other medications for your blood pressure and heart. Please take these as prescribed. - Please follow up in our Internal Medicine clinic as scheduled above.  - Please also follow up with Dr. Verl Blalock as scheduled above, he is a heart failure specialist. - We have a financial counselor at the clinic who can help you get an [employer] for [facility] medical assistance. - In the meantime, we have given you something called a [place] letter. This letter will allow you to get your medicines for $3 each when you leave the hospital. - Your labwork shows you have diabetes, but it is not severe. At this point we recommend diet and exercise. I have attached some literature about this. - You may need a medication called Metformin in the future for your diabetes, your primary care doctor can decide about this. - If you develop severe shortness of breath, chest pain, weight gain please call your clinic or return to the ED.

## 2014-03-08 LAB — CULTURE, BLOOD (ROUTINE X 2)
Culture: NO GROWTH
Culture: NO GROWTH

## 2014-03-10 ENCOUNTER — Ambulatory Visit (INDEPENDENT_AMBULATORY_CARE_PROVIDER_SITE_OTHER): Payer: Medicaid Other | Admitting: Internal Medicine

## 2014-03-10 ENCOUNTER — Encounter: Payer: Self-pay | Admitting: Internal Medicine

## 2014-03-10 VITALS — BP 134/80 | HR 84 | Temp 98.9°F | Ht 70.0 in | Wt 270.3 lb

## 2014-03-10 DIAGNOSIS — N182 Chronic kidney disease, stage 2 (mild): Secondary | ICD-10-CM | POA: Insufficient documentation

## 2014-03-10 DIAGNOSIS — Z720 Tobacco use: Secondary | ICD-10-CM

## 2014-03-10 DIAGNOSIS — I5042 Chronic combined systolic (congestive) and diastolic (congestive) heart failure: Secondary | ICD-10-CM

## 2014-03-10 DIAGNOSIS — I10 Essential (primary) hypertension: Secondary | ICD-10-CM

## 2014-03-10 DIAGNOSIS — E119 Type 2 diabetes mellitus without complications: Secondary | ICD-10-CM | POA: Insufficient documentation

## 2014-03-10 DIAGNOSIS — I129 Hypertensive chronic kidney disease with stage 1 through stage 4 chronic kidney disease, or unspecified chronic kidney disease: Secondary | ICD-10-CM

## 2014-03-10 DIAGNOSIS — F17200 Nicotine dependence, unspecified, uncomplicated: Secondary | ICD-10-CM

## 2014-03-10 DIAGNOSIS — I5041 Acute combined systolic (congestive) and diastolic (congestive) heart failure: Secondary | ICD-10-CM

## 2014-03-10 NOTE — Patient Instructions (Signed)
We will see you back in about 3 months. If you start to feel swelling in your stomach or your legs or have problems breathing call our office for advice about your medicines.   Keep taking your medicines as instructed and follow up with the heart doctor on April 1st.   We will watch you for diabetes and may need to start a medicine for it in the future.   Work on increasing your exercise at home by walking or doing light to moderate activity.   Call our office if you have problems, questions, are feeling sick at [phone].  Please bring your medicines with you each time you come.   Medicines may be  Eye drops  Herbal   Vitamins  Pills  Seeing these help [place] take care of you.   Heart Failure Heart failure means your heart has trouble pumping blood. This makes it hard for your body to work well. Heart failure is usually a long-term (chronic) condition. You must take good care of yourself and follow your doctor's treatment plan. HOME CARE  Take your heart medicine as told by your doctor.  Do not stop taking medicine unless your doctor tells you to.  Do not skip any dose of medicine.  Refill your medicines before they run out.  Take other medicines only as told by your doctor or pharmacist.  Stay active if told by your doctor. The elderly and people with severe heart failure should talk with a doctor about physical activity.  Eat heart healthy foods. Choose foods that are without trans fat and are low in saturated fat, cholesterol, and salt (sodium). This includes fresh or frozen fruits and vegetables, fish, lean meats, fat-free or low-fat dairy foods, whole grains, and high-fiber foods. Lentils and dried peas and beans (legumes) are also good choices.  Limit salt if told by your doctor.  Cook in a healthy way. Roast, grill, broil, bake, poach, steam, or stir-fry foods.  Limit fluids as told by your doctor.  Weigh yourself every morning. Do this after you pee (urinate)  and before you eat breakfast. Write down your weight to give to your doctor.  Take your blood pressure and write it down if your doctor tell you to.  Ask your doctor how to check your pulse. Check your pulse as told.  Lose weight if told by your doctor.  Stop smoking or chewing tobacco. Do not use gum or patches that help you quit without your doctor's approval.  Schedule and go to doctor visits as told.  Nonpregnant women should have no more than 1 drink a day. Men should have no more than 2 drinks a day. Talk to your doctor about drinking alcohol.  Stop illegal drug use.  Stay current with shots (immunizations).  Manage your health conditions as told by your doctor.  Learn to manage your stress.  Rest when you are tired.  If it is really hot outside:  Avoid intense activities.  Use air conditioning or fans, or get in a cooler place.  Avoid caffeine and alcohol.  Wear loose-fitting, lightweight, and light-colored clothing.  If it is really cold outside:  Avoid intense activities.  Layer your clothing.  Wear mittens or gloves, a hat, and a scarf when going outside.  Avoid alcohol.  Learn about heart failure and get support as needed.  Get help to maintain or improve your quality of life and your ability to care for yourself as needed. GET HELP IF:   You gain  03 lb/1.4 kg or more in 1 day or 05 lb/2.3 kg in a week.  You are more short of breath than usual.  You cannot do your normal activities.  You tire easily.  You cough more than normal, especially with activity.  You have any or more puffiness (swelling) in areas such as your hands, feet, ankles, or belly (abdomen).  You cannot sleep because it is hard to breathe.  You feel like your heart is beating fast (palpitations).  You get dizzy or lightheaded when you stand up. GET HELP RIGHT AWAY IF:   You have trouble breathing.  There is a change in mental status, such as becoming less alert or not  being able to focus.  You have chest pain or discomfort.  You faint. MAKE SURE YOU:   Understand these instructions.  Will watch your condition.  Will get help right away if you are not doing well or get worse. Document Released: 09/11/2008 Document Revised: 03/30/2013 Document Reviewed: 07/03/2012 [facility] Patient Information 2014 [place], [place].

## 2014-03-10 NOTE — Progress Notes (Signed)
Subjective:     Patient ID: Matthew [name], male   DOB: 1966-01-13, 48 y.o.   MRN: [id number]  HPI The patient is a 48 YO man who is coming in to the clinic today as a new patient and hospital follow up. He was recently in the hospital for SOB and was diagnosed with CHF and EF 79-39% with diastolic dysfunction as well. He underwent cath and was not found to have CAD amenable to intervention. He was started on medical therapy. He was able to afford the medications with the match program and has been taking them faithfully since leaving the hospital. He has not had any problems with side effects. He has several questions about his new diagnosis and we spent time talking about his problems. He is not having any dizziness or lightheadedness. He is not having swelling in his legs or arms or stomach since leaving the hospital. He is not having SOB or cough since leaving the hospital. He has returned to work but wants a note to say that it is okay for him to be there. No fevers or chills at home.   PMH, FH, PSH, social history, allergies, medications, immunizations reviewed and updated at today's visit.   Review of Systems  Constitutional: Negative for fever, chills, diaphoresis, activity change, appetite change, fatigue and unexpected weight change.  HENT: Negative for sore throat.   Respiratory: Negative for cough, choking, shortness of breath and wheezing.   Cardiovascular: Negative for chest pain, palpitations and leg swelling.  Gastrointestinal: Negative for nausea, vomiting, abdominal pain, diarrhea, constipation and abdominal distention.  Endocrine: Negative for polydipsia, polyphagia and polyuria.  Musculoskeletal: Negative for arthralgias, back pain, gait problem, joint swelling, myalgias, neck pain and neck stiffness.  Skin: Negative for color change, pallor, rash and wound.  Neurological: Negative for dizziness, tremors, seizures, syncope, facial asymmetry, speech difficulty, weakness,  light-headedness, numbness and headaches.       Objective:   Physical Exam  Constitutional: He is oriented to person, place, and time. He appears well-developed and well-nourished. No distress.  Obese  HENT:  Head: Normocephalic and atraumatic.  Eyes: EOM are normal. Pupils are equal, round, and reactive to light.  Neck: Normal range of motion. Neck supple. No JVD present. No tracheal deviation present. No thyromegaly present.  Cardiovascular: Normal rate, regular rhythm, normal heart sounds and intact distal pulses.   Pulmonary/Chest: Effort normal and breath sounds normal. No respiratory distress. He has no wheezes. He has no rales. He exhibits no tenderness.  Abdominal: Soft. Bowel sounds are normal. He exhibits no distension. There is no tenderness. There is no rebound and no guarding.  Obese  Musculoskeletal: Normal range of motion. He exhibits no edema and no tenderness.  Lymphadenopathy:    He has no cervical adenopathy.  Neurological: He is alert and oriented to person, place, and time. No cranial nerve deficit.  Skin: Skin is warm and dry. No rash noted. He is not diaphoretic. No erythema. No pallor.       Assessment/Plan:   1. Please see problem oriented charting.   2. Disposition - The patient will follow up with the heart failure clinic on 03/17/14 with Dr. Verl Blalock. He will return to the clinic in about 3 months. He will continue his medications. He will speak with our financial counselor regarding his lack of health coverage. Spoke with him about the implications of his new diagnoses. Foot exam performed at today's visit.

## 2014-03-10 NOTE — Progress Notes (Signed)
Case discussed with Dr. Kollar soon after the resident saw the patient.  We reviewed the resident's history and exam and pertinent patient test results.  I agree with the assessment, diagnosis, and plan of care documented in the resident's note. 

## 2014-03-10 NOTE — Assessment & Plan Note (Signed)
No SOB or swelling or rales on exam today. He is heavier than in the hospital and feel that clothing and difference in scale play a role in this. His diuretic is working and he is taking it. Continue ASA, beta blocker, lasix, imdur, hydralazine, ACE-I, statin.

## 2014-03-10 NOTE — Assessment & Plan Note (Signed)
He has not smoked since leaving the hospital and intends to quit for good. Advised him that this was a great idea and would be good for his heart and overall health.

## 2014-03-10 NOTE — Assessment & Plan Note (Signed)
Recheck BMP at today's visit. Last GFR 73.

## 2014-03-10 NOTE — Assessment & Plan Note (Signed)
BP under good control at today's visit and will continue his hydralazine 10 mg TID, lisinopril 5 mg daily (needs uptitration in future), imdur 15 mg daily, lasix 40 mg bid, coreg 3.125 mg bid (with HR 84 could also be uptitrated if needed).

## 2014-03-10 NOTE — Assessment & Plan Note (Addendum)
Patient would benefit from metformin 500 mg bid if able given his metabolic syndrome and newly diagnosed diabetes mellitus. He would like to defer at today's visit given his new CHF and multiple medications associated with that diagnosis. He will consider at next visit in 3 months.

## 2014-03-11 LAB — BASIC METABOLIC PANEL WITH GFR
BUN: 11 mg/dL (ref 6–23)
CO2: 27 mEq/L (ref 19–32)
Calcium: 9 mg/dL (ref 8.4–10.5)
Chloride: 104 mEq/L (ref 96–112)
Creat: 1.35 mg/dL (ref 0.50–1.35)
GFR, Est African American: 72 mL/min
GFR, Est Non African American: 62 mL/min
Glucose, Bld: 94 mg/dL (ref 70–99)
Potassium: 4.4 mEq/L (ref 3.5–5.3)
Sodium: 140 mEq/L (ref 135–145)

## 2014-03-17 ENCOUNTER — Ambulatory Visit: Payer: Medicaid Other | Attending: Cardiology | Admitting: Cardiology

## 2014-03-17 ENCOUNTER — Encounter: Payer: Self-pay | Admitting: Cardiology

## 2014-03-17 ENCOUNTER — Telehealth: Payer: Self-pay | Admitting: Internal Medicine

## 2014-03-17 ENCOUNTER — Other Ambulatory Visit: Payer: Self-pay | Admitting: Emergency Medicine

## 2014-03-17 VITALS — BP 138/81 | HR 84 | Temp 97.4°F | Resp 18 | Ht 70.0 in | Wt 270.0 lb

## 2014-03-17 DIAGNOSIS — I10 Essential (primary) hypertension: Secondary | ICD-10-CM

## 2014-03-17 DIAGNOSIS — N182 Chronic kidney disease, stage 2 (mild): Secondary | ICD-10-CM | POA: Insufficient documentation

## 2014-03-17 DIAGNOSIS — I129 Hypertensive chronic kidney disease with stage 1 through stage 4 chronic kidney disease, or unspecified chronic kidney disease: Secondary | ICD-10-CM | POA: Insufficient documentation

## 2014-03-17 DIAGNOSIS — I5042 Chronic combined systolic (congestive) and diastolic (congestive) heart failure: Secondary | ICD-10-CM | POA: Insufficient documentation

## 2014-03-17 DIAGNOSIS — E119 Type 2 diabetes mellitus without complications: Secondary | ICD-10-CM | POA: Insufficient documentation

## 2014-03-17 DIAGNOSIS — I509 Heart failure, unspecified: Secondary | ICD-10-CM | POA: Insufficient documentation

## 2014-03-17 MED ORDER — CARVEDILOL 3.125 MG PO TABS: 6.2500 mg | ORAL_TABLET | Freq: Two times a day (BID) | ORAL | Status: DC

## 2014-03-17 MED ORDER — SPIRONOLACTONE 25 MG PO TABS: 25.0000 mg | ORAL_TABLET | Freq: Every day | ORAL | Status: DC

## 2014-03-17 NOTE — Progress Notes (Signed)
Pt here to f/u with Dr. Verl Blalock for EF 25-50% Pt is being seen at heart clinic Taking prescribed medications Last weight 3/25- 270.3

## 2014-03-17 NOTE — Telephone Encounter (Signed)
Order clarified and updated for script Coreg

## 2014-03-17 NOTE — Progress Notes (Signed)
HPI [name] comes to our cardiology clinic today to establish with me as his cardiologist. He was recently admitted to the hospital with acute combined congestive heart failure. He presented with shortness of breath and lower extremity edema. EF was 25 percent. He has a history of hypertension, tobacco use, and obesity. He has quit smoking. Catheterization demonstrated no coronary artery disease. He has been faithful with his medications since discharge and has had no recurrent shortness of breath other than some mild dyspnea on exertion and no lower extremity swelling.  Denies orthopnea PND and sleeps well. He is trying to get his weight down. Recent seen in the internal medicine clinic where labs were stable.  Past Medical History  Diagnosis Date  . Hypertension   . Cardiomyopathy     a. 02/2014 Echo: EF 25-30%, sev glob HK with inferolat HK->AK, mod conc LVH, Gr 2 DD, Mild MR, sev dil LA.  Marland Kitchen Acute combined systolic and diastolic CHF, NYHA class 3     a. 02/2014 Echo: EF 25-30%.  . Tobacco abuse   . Morbid obesity     Current Outpatient Prescriptions  Medication Sig Dispense Refill  . carvedilol (COREG) 3.125 MG tablet Take 2 tablets (6.25 mg total) by mouth 2 (two) times daily with a meal.  60 tablet  1  . furosemide (LASIX) 40 MG tablet Take 1 tablet (40 mg total) by mouth 2 (two) times daily.  60 tablet  1  . hydrALAZINE (APRESOLINE) 10 MG tablet Take 1 tablet (10 mg total) by mouth every 8 (eight) hours.  90 tablet  1  . isosorbide mononitrate (IMDUR) 30 MG 24 hr tablet Take 0.5 tablets (15 mg total) by mouth daily.  15 tablet  1  . lisinopril (PRINIVIL,ZESTRIL) 5 MG tablet Take 1 tablet (5 mg total) by mouth daily.  30 tablet  1  . lovastatin (MEVACOR) 20 MG tablet Take 2 tablets (40 mg total) by mouth at bedtime.  60 tablet  1  . potassium chloride 20 MEQ TBCR Take 20 mEq by mouth daily.  30 tablet  1  . Dextromethorphan-Guaifenesin (ROBITUSSIN DM PO) Take 10 mLs by mouth every 6  (six) hours as needed (for cold symptoms).      Marland Kitchen spironolactone (ALDACTONE) 25 MG tablet Take 1 tablet (25 mg total) by mouth daily.  90 tablet  3   No current facility-administered medications for this visit.    No Known Allergies  Family History  Problem Relation Age of Onset  . Heart attack Father     died @ 19  . Cervical cancer Mother     died @ 1    History   Social History  . Marital Status: Married    Spouse Name: N/A    Number of Children: N/A  . Years of Education: N/A   Occupational History  . Not on file.   Social History Main Topics  . Smoking status: Former Smoker -- 0.25 packs/day for 20 years    Types: Cigarettes    Quit date: 03/02/2014  . Smokeless tobacco: Never Used  . Alcohol Use: No  . Drug Use: No  . Sexual Activity: Yes   Other Topics Concern  . Not on file   Social History Narrative   Lives with wife and 2 young children in [place].  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    ROS ALL NEGATIVE EXCEPT THOSE NOTED IN HPI  PE  General Appearance: well  developed, well nourished in no acute distress, muscular, obese HEENT: symmetrical face, PERRLA, good dentition  Neck: no JVD, thyromegaly, or adenopathy, trachea midline Chest: symmetric without deformity Cardiac: PMI non-displaced, RRR, normal S1, S2, no gallop or murmur Lung: clear to ausculation and percussion Vascular: all pulses full without bruits  Abdominal: nondistended, nontender, good bowel sounds, no HSM, no bruits Extremities: no cyanosis, clubbing or edema, no sign of DVT, no varicosities  Skin: normal color, no rashes Neuro: alert and oriented x 3, non-focal Pysch: normal affect  EKG  BMET    Component Value Date/Time   NA 140 03/10/2014 1605   K 4.4 03/10/2014 1605   CL 104 03/10/2014 1605   CO2 27 03/10/2014 1605   GLUCOSE 94 03/10/2014 1605   BUN 11 03/10/2014 1605   CREATININE 1.35 03/10/2014 1605   CREATININE 1.29 03/05/2014 0530   CALCIUM  9.0 03/10/2014 1605   GFRNONAA 62 03/10/2014 1605   GFRNONAA 65* 03/05/2014 0530   GFRAA 72 03/10/2014 1605   GFRAA 75* 03/05/2014 0530    Lipid Panel     Component Value Date/Time   CHOL 201* 03/05/2014 0530   TRIG 181* 03/05/2014 0530   HDL 29* 03/05/2014 0530   CHOLHDL 6.9 03/05/2014 0530   VLDL 36 03/05/2014 0530   LDLCALC 136* 03/05/2014 0530    CBC    Component Value Date/Time   WBC 4.4 03/02/2014 0915   RBC 4.84 03/02/2014 0915   HGB 14.4 03/02/2014 0915   HCT 42.2 03/02/2014 0915   PLT 219 03/02/2014 0915   MCV 87.2 03/02/2014 0915   MCH 29.8 03/02/2014 0915   MCHC 34.1 03/02/2014 0915   RDW 13.6 03/02/2014 0915   LYMPHSABS 1.8 03/02/2014 0915   MONOABS 0.4 03/02/2014 0915   EOSABS 0.1 03/02/2014 0915   BASOSABS 0.0 03/02/2014 0915

## 2014-03-17 NOTE — Assessment & Plan Note (Signed)
I suspect this is secondary to hypertension poorly controlled, obesity, so has a good chance of improving. I have increased his carvedilol to 6.25 mg by mouth twice a day. I've added spironolactone 25 mg by mouth q. day. We'll have him return for metabolic profile in a week. Left his other medicines unchanged except I did stop his aspirin. I've encouraged him to walk on regular basis and to lose weight. I will see him back for close followup in about 4 weeks.

## 2014-03-17 NOTE — Patient Instructions (Signed)
Pt instructed to increase coreg 6.25 mg BID Aldactone 25 mg added Pt to return in 1 week for BMET F/u with Dr. Verl Blalock x 1 mnth

## 2014-03-17 NOTE — Telephone Encounter (Signed)
Tim from [facility] called regarding pt Matthew [name] medication that was sent over by Dr. Verl Blalock. Please contact pharmacy @  [phone]

## 2014-03-24 ENCOUNTER — Ambulatory Visit: Payer: Medicaid Other | Attending: Cardiology

## 2014-03-24 DIAGNOSIS — R7989 Other specified abnormal findings of blood chemistry: Secondary | ICD-10-CM

## 2014-03-24 LAB — BASIC METABOLIC PANEL
BUN: 17 mg/dL (ref 6–23)
CO2: 28 mEq/L (ref 19–32)
Calcium: 9.3 mg/dL (ref 8.4–10.5)
Chloride: 102 mEq/L (ref 96–112)
Creat: 1.41 mg/dL — ABNORMAL HIGH (ref 0.50–1.35)
Glucose, Bld: 115 mg/dL — ABNORMAL HIGH (ref 70–99)
Potassium: 4.2 mEq/L (ref 3.5–5.3)
Sodium: 138 mEq/L (ref 135–145)

## 2014-04-21 ENCOUNTER — Ambulatory Visit: Payer: Medicaid Other | Attending: Cardiology | Admitting: Cardiology

## 2014-04-21 ENCOUNTER — Encounter: Payer: Self-pay | Admitting: Cardiology

## 2014-04-21 VITALS — BP 102/69 | HR 79 | Temp 98.8°F | Resp 16 | Ht 70.0 in | Wt 267.0 lb

## 2014-04-21 DIAGNOSIS — I509 Heart failure, unspecified: Secondary | ICD-10-CM

## 2014-04-21 DIAGNOSIS — I10 Essential (primary) hypertension: Secondary | ICD-10-CM

## 2014-04-21 DIAGNOSIS — I5042 Chronic combined systolic (congestive) and diastolic (congestive) heart failure: Secondary | ICD-10-CM

## 2014-04-21 MED ORDER — HYDRALAZINE HCL 10 MG PO TABS: 10.0000 mg | ORAL_TABLET | Freq: Three times a day (TID) | ORAL | Status: DC

## 2014-04-21 NOTE — Patient Instructions (Signed)
Continue taking all prescribed medications and f/u with Dr. Verl Blalock in 6 months Continue daily weights with diuretic/potassium adjustments

## 2014-04-21 NOTE — Assessment & Plan Note (Signed)
Good control. No change in medical therapy. We'll try to get his medications cheaper through the pharmacy here. Encouraged to continue to walk and lose weight.

## 2014-04-21 NOTE — Assessment & Plan Note (Signed)
Has lost 6 pounds. Encouraged to try to lose 12 pounds over the next 6 months.

## 2014-04-21 NOTE — Progress Notes (Signed)
Pt here to f/u with Dr. Verl Blalock for Cardiomyopathy. Pt is compliant with taking medications daily Denies sob or chest pain at this visit Bp- 102/69 79 Pt c/o erectile dysfunction with meds Need refills on Hydralazine Pt is monitoring daily weights with anti-diuretics

## 2014-04-21 NOTE — Assessment & Plan Note (Signed)
Remarkably stable. On good medical program for hopeful left ventricular systolic function recovery. Continues to lose weight and is walking. We'll have her return in 6 months for followup and then an echocardiogram to reassess LV function.

## 2014-05-06 ENCOUNTER — Ambulatory Visit: Payer: Self-pay

## 2014-05-14 ENCOUNTER — Other Ambulatory Visit (HOSPITAL_COMMUNITY): Payer: Self-pay | Admitting: Internal Medicine

## 2014-06-08 ENCOUNTER — Encounter: Payer: Self-pay | Admitting: Internal Medicine

## 2014-06-24 ENCOUNTER — Other Ambulatory Visit: Payer: Self-pay | Admitting: *Deleted

## 2014-06-24 MED ORDER — SPIRONOLACTONE 25 MG PO TABS: 25.0000 mg | ORAL_TABLET | Freq: Every day | ORAL | Status: DC

## 2014-06-24 MED ORDER — FUROSEMIDE 40 MG PO TABS: 40.0000 mg | ORAL_TABLET | Freq: Two times a day (BID) | ORAL | Status: DC

## 2014-06-24 MED ORDER — ISOSORBIDE MONONITRATE ER 30 MG PO TB24: 15.0000 mg | ORAL_TABLET | Freq: Every day | ORAL | Status: DC

## 2014-06-24 MED ORDER — LOVASTATIN 20 MG PO TABS: 40.0000 mg | ORAL_TABLET | Freq: Every day | ORAL | Status: DC

## 2014-06-24 MED ORDER — POTASSIUM CHLORIDE ER 20 MEQ PO TBCR: 20.0000 meq | EXTENDED_RELEASE_TABLET | Freq: Every day | ORAL | Status: DC

## 2014-06-24 MED ORDER — HYDRALAZINE HCL 10 MG PO TABS: 10.0000 mg | ORAL_TABLET | Freq: Three times a day (TID) | ORAL | Status: DC

## 2014-06-24 MED ORDER — LISINOPRIL 5 MG PO TABS: 5.0000 mg | ORAL_TABLET | Freq: Every day | ORAL | Status: DC

## 2014-06-24 MED ORDER — CARVEDILOL 3.125 MG PO TABS: 6.2500 mg | ORAL_TABLET | Freq: Two times a day (BID) | ORAL | Status: DC

## 2014-08-20 ENCOUNTER — Other Ambulatory Visit: Payer: Self-pay | Admitting: Internal Medicine

## 2014-08-26 ENCOUNTER — Other Ambulatory Visit: Payer: Self-pay | Admitting: Internal Medicine

## 2014-09-09 ENCOUNTER — Other Ambulatory Visit: Payer: Self-pay | Admitting: Internal Medicine

## 2014-10-19 ENCOUNTER — Other Ambulatory Visit: Payer: Self-pay | Admitting: Internal Medicine

## 2014-10-20 ENCOUNTER — Ambulatory Visit: Payer: Self-pay | Admitting: Cardiology

## 2014-10-27 ENCOUNTER — Ambulatory Visit: Payer: Self-pay | Admitting: Cardiology

## 2014-11-02 ENCOUNTER — Telehealth: Payer: Self-pay | Admitting: Emergency Medicine

## 2014-11-24 ENCOUNTER — Encounter: Payer: Self-pay | Admitting: Cardiology

## 2014-11-24 ENCOUNTER — Ambulatory Visit: Payer: Self-pay | Attending: Cardiology | Admitting: Cardiology

## 2014-11-24 VITALS — BP 142/91 | HR 81 | Temp 98.3°F | Resp 16 | Ht 70.0 in | Wt 279.0 lb

## 2014-11-24 DIAGNOSIS — I509 Heart failure, unspecified: Secondary | ICD-10-CM | POA: Insufficient documentation

## 2014-11-24 DIAGNOSIS — E119 Type 2 diabetes mellitus without complications: Secondary | ICD-10-CM

## 2014-11-24 DIAGNOSIS — N182 Chronic kidney disease, stage 2 (mild): Secondary | ICD-10-CM

## 2014-11-24 DIAGNOSIS — I129 Hypertensive chronic kidney disease with stage 1 through stage 4 chronic kidney disease, or unspecified chronic kidney disease: Secondary | ICD-10-CM | POA: Insufficient documentation

## 2014-11-24 DIAGNOSIS — Z9119 Patient's noncompliance with other medical treatment and regimen: Secondary | ICD-10-CM | POA: Insufficient documentation

## 2014-11-24 DIAGNOSIS — I5042 Chronic combined systolic (congestive) and diastolic (congestive) heart failure: Secondary | ICD-10-CM

## 2014-11-24 DIAGNOSIS — Z6841 Body Mass Index (BMI) 40.0 and over, adult: Secondary | ICD-10-CM | POA: Insufficient documentation

## 2014-11-24 DIAGNOSIS — I10 Essential (primary) hypertension: Secondary | ICD-10-CM

## 2014-11-24 DIAGNOSIS — G4730 Sleep apnea, unspecified: Secondary | ICD-10-CM

## 2014-11-24 LAB — GLUCOSE, POCT (MANUAL RESULT ENTRY): POC Glucose: 150 mg/dl — AB (ref 70–99)

## 2014-11-24 LAB — POCT GLYCOSYLATED HEMOGLOBIN (HGB A1C): Hemoglobin A1C: 6.6

## 2014-11-24 MED ORDER — HYDRALAZINE HCL 10 MG PO TABS: 10.0000 mg | ORAL_TABLET | Freq: Four times a day (QID) | ORAL | Status: DC

## 2014-11-24 MED ORDER — SIMVASTATIN 20 MG PO TABS: 20.0000 mg | ORAL_TABLET | Freq: Every day | ORAL | Status: DC

## 2014-11-24 MED ORDER — LISINOPRIL 5 MG PO TABS: 5.0000 mg | ORAL_TABLET | Freq: Every day | ORAL | Status: DC

## 2014-11-24 MED ORDER — FUROSEMIDE 40 MG PO TABS: 40.0000 mg | ORAL_TABLET | Freq: Two times a day (BID) | ORAL | Status: DC

## 2014-11-24 MED ORDER — CARVEDILOL 6.25 MG PO TABS: 6.2500 mg | ORAL_TABLET | Freq: Two times a day (BID) | ORAL | Status: DC

## 2014-11-24 MED ORDER — SPIRONOLACTONE 25 MG PO TABS: 25.0000 mg | ORAL_TABLET | Freq: Every day | ORAL | Status: DC

## 2014-11-24 MED ORDER — ISOSORBIDE MONONITRATE ER 30 MG PO TB24: 15.0000 mg | ORAL_TABLET | Freq: Every day | ORAL | Status: DC

## 2014-11-24 NOTE — Assessment & Plan Note (Signed)
Continue good blood pressure control including ACE inhibitor. Will evaluate hemoglobin A1c.

## 2014-11-24 NOTE — Assessment & Plan Note (Signed)
He is currently.control with his diabetes. Unfortunately, he complains of polyuria and polydipsia. We'll check hemoglobin A1c. He may need to go on pharmacological therapy.

## 2014-11-24 NOTE — Progress Notes (Signed)
Pt comes in per 6 month f/u CHF,HTn with medication management Pt is no compliant with taking medications due to cost Pt stopped taking Carvedilol 1 mnth ago  States he is eating a lot of white breads/sodas Denies sob ,chest or swelling Also no compliant with home weight monitoring  Todays weight  279 lbs.

## 2014-11-24 NOTE — Addendum Note (Signed)
Addended by: Candie Chroman D on: 11/24/2014 04:41 PM   Modules accepted: Orders

## 2014-11-24 NOTE — Progress Notes (Signed)
Mr. Reinwald comes in for six-month follow-up. Unlike last visit, he's become more noncompliant with his medication as well as his diet. His weight is up considerably. He also complains of polyuria and polydipsia. He gets up much of the night to go the bathroom. He says his wife also says he stops breathing and he is worried about sleep apnea. Heis  certainly at risk with his morbid obesity.  He has a history of diabetes is controlled with diet. Recent numbers look pretty good. We'll need to reassess.  His blood pressures also mildly increased today. He is not taking his carvedilol at all. He is certainly not following a good diet.  Physical examination shows him to be in no acute distress. He is morbidly obese. Vital signs reported. Neck shows no obvious JVD but difficult to read. Lungs are clear to auscultation percussion. Cardiac exam reveals a nondisplaced PMI. He has normal S1-S2 with no gallop. Abdominal exam is distended but soft. Extremities reveal trace to 1+ pitting edema.

## 2014-11-24 NOTE — Assessment & Plan Note (Signed)
I have emphasized that he must lose considerable weight if he plans on having any reasonable quality and quantity of life. His body is artery showing signs of not compensating for this stress and others by his reduced LV function, and chronic kidney disease.

## 2014-11-24 NOTE — Assessment & Plan Note (Signed)
Restart carvedilol. Encouraged weight loss. Obtain sleep consult to rule out sleep apnea.

## 2014-11-24 NOTE — Assessment & Plan Note (Signed)
He unfortunately continues to be noncompliant with his medication as well as with his diet. His morbid obesity is also probably associated with what he describes now as sleep apnea. We will obtain sleep consultation and a new his medications. I will not repeat his echocardiogram at that time because his therapy has not been optimized for 6 months because of his noncompliance. I encouraged him to for improved quality and quantity of life by taking better care of himself. I'll see back in 3 months.

## 2014-11-25 ENCOUNTER — Encounter (HOSPITAL_COMMUNITY): Payer: Self-pay | Admitting: Cardiovascular Disease

## 2014-12-02 ENCOUNTER — Telehealth: Payer: Self-pay | Admitting: Emergency Medicine

## 2014-12-02 NOTE — Telephone Encounter (Signed)
-----   Message from Renella Cunas, MD sent at 11/24/2014  3:12 PM EST ----- Hemoglobin A1c is less than 7%. He does not need pain medication but needs strict dietary instructions to lose weight.

## 2014-12-02 NOTE — Telephone Encounter (Signed)
Left message for pt to call for lab results 

## 2015-02-08 ENCOUNTER — Ambulatory Visit (HOSPITAL_BASED_OUTPATIENT_CLINIC_OR_DEPARTMENT_OTHER): Payer: 59 | Attending: Cardiology

## 2015-02-08 DIAGNOSIS — G4730 Sleep apnea, unspecified: Secondary | ICD-10-CM | POA: Diagnosis present

## 2015-02-19 DIAGNOSIS — G4730 Sleep apnea, unspecified: Secondary | ICD-10-CM

## 2015-02-19 NOTE — Sleep Study (Signed)
   NAME: Matthew [name] DATE OF BIRTH:  [birth year] MEDICAL RECORD NUMBER [id number]  LOCATION: [facility]  PHYSICIAN: Ashutosh Dieguez D  DATE OF STUDY: 02/08/2015  SLEEP STUDY TYPE: Nocturnal Polysomnogram               REFERRING PHYSICIAN: Wall, Marijo Conception, MD  INDICATION FOR STUDY: Hypersomnia with sleep apnea  EPWORTH SLEEPINESS SCORE:   20/24 HEIGHT: 5\' 10"  (177.8 cm)  WEIGHT: 122.471 kg (270 lb)    Body mass index is 38.74 kg/(m^2).  NECK SIZE:   19.5 in.  MEDICATIONS: Charted for review  SLEEP ARCHITECTURE: Total sleep time 326.5 minutes with sleep efficiency 89.5%. Stage I was 18.7%, stage II 65.8%, stage III absent, REM 15.5% of total sleep time. Sleep latency 4.5 minutes, REM latency 147.5 minutes, awake after sleep onset 35 minutes, arousal index 83.2, bedtime medication: "Patient reported taking medications but stated he did not know what he took".  RESPIRATORY DATA: Apnea hypopnea index (AHI) 85.5 per hour. 465 total events scored including 258 obstructive apneas, 22 central apneas, 103 mixed apneas, 82 hypopneas. Non-positional events. REM AHI 79.6 per hour. This study was ordered as a diagnostic polysomnogram without CPAP titration.  OXYGEN DATA: Very loud snoring with oxygen desaturation to a nadir of 70% and mean saturation 89.2% on room air.  CARDIAC DATA: Sinus rhythm with PACs and PVCs  MOVEMENT/PARASOMNIA: No significant movement disturbance, bathroom 2. Somniloquy (sleep talking) noted.  IMPRESSION/ RECOMMENDATION:   1) Severe obstructive sleep apnea/hypopnea syndrome, AHI 85.5 per hour with non-positional events. Very loud snoring with oxygen desaturation to a nadir of 70% and mean saturation 89.2% on room air. Room air oxygen saturation on arrival while upright and awake was 97%. 2) This study was ordered as a diagnostic polysomnogram protocol without CPAP. The patient can return for dedicated CPAP titration study if appropriate.    Deneise Lever Diplomate, American Board of Sleep Medicine  ELECTRONICALLY SIGNED ON:  02/19/2015, 3:00 PM [place] PH: [phone]   FX: [phone] [place]

## 2015-02-23 ENCOUNTER — Telehealth: Payer: Self-pay | Admitting: Internal Medicine

## 2015-02-23 NOTE — Telephone Encounter (Signed)
Patient called to request the results of his sleep study, please f/u with pt. At  [phone].

## 2015-02-28 ENCOUNTER — Telehealth: Payer: Self-pay | Admitting: Internal Medicine

## 2015-02-28 NOTE — Telephone Encounter (Signed)
Patient is calling to request his sleep study results, please f/u with pt.

## 2015-03-04 NOTE — Telephone Encounter (Signed)
Patient had sleep study on 02/08/15 and is calling for results of sleep study. Message routed to Dr. Annitta Needs and Dr. Verl Blalock so sleep study can be reviewed.

## 2015-03-09 ENCOUNTER — Telehealth: Payer: Self-pay

## 2015-03-09 DIAGNOSIS — G4733 Obstructive sleep apnea (adult) (pediatric): Secondary | ICD-10-CM

## 2015-03-09 NOTE — Telephone Encounter (Signed)
Call placed to patient to inform of sleep study results.  Unable to reach patient; left voicemail requesting return call.  Referral placed.

## 2015-03-09 NOTE — Telephone Encounter (Signed)
He has severe obstructive sleep apnea. Please obtain consult from [facility]. Please let patient know the results.

## 2015-03-09 NOTE — Telephone Encounter (Signed)
Patient calling requesting results from his sleep study that Dr wall  Had ordered Please follow up with patient

## 2015-03-09 NOTE — Telephone Encounter (Signed)
Message routed to Dr. Verl Blalock so sleep study can be reviewed.

## 2015-03-16 ENCOUNTER — Telehealth: Payer: Self-pay | Admitting: General Practice

## 2015-03-16 NOTE — Telephone Encounter (Signed)
Patient calling to request results from sleep apnea study. Patient is aware that nurse has tried calling him to provide these results to him. Please follow up and assist.

## 2015-03-16 NOTE — Telephone Encounter (Signed)
Patient given sleep study results. Informed referral ordered for [facility] for severe sleep apnea.  Patient verbalized understanding and appreciative of information

## 2015-05-10 ENCOUNTER — Ambulatory Visit (INDEPENDENT_AMBULATORY_CARE_PROVIDER_SITE_OTHER): Payer: 59 | Admitting: Pulmonary Disease

## 2015-05-10 ENCOUNTER — Encounter: Payer: Self-pay | Admitting: Pulmonary Disease

## 2015-05-10 VITALS — BP 100/70 | HR 81 | Ht 70.0 in | Wt 282.2 lb

## 2015-05-10 DIAGNOSIS — G4733 Obstructive sleep apnea (adult) (pediatric): Secondary | ICD-10-CM | POA: Insufficient documentation

## 2015-05-10 NOTE — Progress Notes (Signed)
Subjective:    Patient ID: Matthew [name], male    DOB: 01-Aug-1966, 49 y.o.   MRN: [id number]  HPI  Chief Complaint  Patient presents with  . Sleep Consult    Referred by Dr. Annitta Needs; does not sleep well, up and down all night, witnessed apneas, snoring very loudly. wakes up with headaches a lot.  Feels very fatigued all the time.  lack of energy.  Epworth Score: 21  Discuss Sleep Study results.   49 year old ex-smoker with chronic systolic heart failure, presents for evaluation of obstructive sleep apnea. He reports extreme daytime somnolence , Epworth sleepiness score is 21. He works in a warehouse on the day shift-and his somnolence is to the point where it interferes with his daytime activities. Apneas have been witnessed by family members, as has loud snoring. He reports headaches in the morning. Bedtime is between 8 and 10 PM, but he dozes off all day before that, his best position is to sleep sitting up, reports 4-5 nocturnal awakenings including nocturia without post void sleep latency is out of bed by 6 AM feeling tired with dryness of mouth and occasional headache. He is gained about 50 pounds in the last 5 years  There is no history suggestive of cataplexy, sleep paralysis or parasomnias  01/2015 PSG -severe OSA - AHI 85/h, lowest desaturation 70%, total sleep time 326 minutes  He smoked about half pack per day until he quit in 03/2014 He has chronic systolic heart failure presently due to hypertensive heart disease. He has difficulty controlling hypertension.   Past Medical History  Diagnosis Date  . Hypertension   . Cardiomyopathy     a. 02/2014 Echo: EF 25-30%, sev glob HK with inferolat HK->AK, mod conc LVH, Gr 2 DD, Mild MR, sev dil LA.  Marland Kitchen Acute combined systolic and diastolic CHF, NYHA class 3     a. 02/2014 Echo: EF 25-30%.  . Tobacco abuse   . Morbid obesity     Past Surgical History  Procedure Laterality Date  . Cataract extraction Right   . Left heart  catheterization with coronary angiogram N/A 03/04/2014    Procedure: LEFT HEART CATHETERIZATION WITH CORONARY ANGIOGRAM;  Surgeon: Burnell Blanks, MD;  Location: [facility] CATH LAB;  Service: Cardiovascular;  Laterality: N/A;   No Known Allergies  History   Social History  . Marital Status: Married    Spouse Name: N/A  . Number of Children: N/A  . Years of Education: N/A   Occupational History  . Not on file.   Social History Main Topics  . Smoking status: Former Smoker -- 0.25 packs/day for 20 years    Types: Cigarettes    Quit date: 03/02/2014  . Smokeless tobacco: Never Used  . Alcohol Use: No  . Drug Use: No  . Sexual Activity: Yes   Other Topics Concern  . Not on file   Social History Narrative   Lives with wife and 2 young children in [place].  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    Family History  Problem Relation Age of Onset  . Heart attack Father     died @ 18  . Cervical cancer Mother     died @ 24      Review of Systems  Constitutional: Negative for fever, chills, activity change, appetite change and unexpected weight change.  HENT: Negative for congestion, dental problem, postnasal drip, rhinorrhea, sneezing, sore throat, trouble swallowing and voice change.  Eyes: Negative for visual disturbance.  Respiratory: Negative for cough, choking and shortness of breath.   Cardiovascular: Negative for chest pain and leg swelling.  Gastrointestinal: Negative for nausea, vomiting and abdominal pain.  Genitourinary: Negative for difficulty urinating.  Musculoskeletal: Negative for arthralgias.  Skin: Negative for rash.  Psychiatric/Behavioral: Negative for behavioral problems and confusion.       Objective:   Physical Exam  Gen. Pleasant, obese, in no distress, normal affect ENT - no lesions, no post nasal drip, class 2-3 airway Neck: No JVD, no thyromegaly, no carotid bruits Lungs: no use of accessory muscles, no dullness to  percussion, decreased without rales or rhonchi  Cardiovascular: Rhythm regular, heart sounds  normal, no murmurs or gallops, no peripheral edema Abdomen: soft and non-tender, no hepatosplenomegaly, BS normal. Musculoskeletal: No deformities, no cyanosis or clubbing Neuro:  alert, non focal, no tremors       Assessment & Plan:

## 2015-05-10 NOTE — Assessment & Plan Note (Signed)
The pathophysiology of obstructive sleep apnea , it's cardiovascular consequences & modes of treatment including CPAP were discused with the patient in detail & they evidenced understanding. Will start autoCPAP, mask of choice, 8-20 cm, mask of choice, download in 4 wks  Weight loss encouraged, compliance with goal of at least 4-6 hrs every night is the expectation. Advised against medications with sedative side effects Cautioned against driving when sleepy - understanding that sleepiness will vary on a day to day basis

## 2015-05-10 NOTE — Patient Instructions (Signed)
We will send Rx to set you up with a CPAP machine Expectation is that you use it every night Set up appt with heart doctor

## 2015-06-08 ENCOUNTER — Ambulatory Visit: Payer: 59 | Attending: Cardiology | Admitting: Cardiology

## 2015-06-08 ENCOUNTER — Encounter: Payer: Self-pay | Admitting: Cardiology

## 2015-06-08 VITALS — BP 122/81 | HR 77 | Temp 97.9°F | Resp 18 | Ht 70.0 in | Wt 281.2 lb

## 2015-06-08 DIAGNOSIS — I10 Essential (primary) hypertension: Secondary | ICD-10-CM | POA: Diagnosis not present

## 2015-06-08 DIAGNOSIS — Z87891 Personal history of nicotine dependence: Secondary | ICD-10-CM | POA: Insufficient documentation

## 2015-06-08 DIAGNOSIS — I5042 Chronic combined systolic (congestive) and diastolic (congestive) heart failure: Secondary | ICD-10-CM

## 2015-06-08 MED ORDER — CARVEDILOL 6.25 MG PO TABS: 6.2500 mg | ORAL_TABLET | Freq: Two times a day (BID) | ORAL | Status: DC

## 2015-06-08 MED ORDER — FUROSEMIDE 40 MG PO TABS: 40.0000 mg | ORAL_TABLET | Freq: Two times a day (BID) | ORAL | Status: DC

## 2015-06-08 MED ORDER — SPIRONOLACTONE 25 MG PO TABS: 25.0000 mg | ORAL_TABLET | Freq: Every day | ORAL | Status: DC

## 2015-06-08 MED ORDER — SIMVASTATIN 20 MG PO TABS: 20.0000 mg | ORAL_TABLET | Freq: Every day | ORAL | Status: DC

## 2015-06-08 MED ORDER — LISINOPRIL 5 MG PO TABS: 5.0000 mg | ORAL_TABLET | Freq: Every day | ORAL | Status: DC

## 2015-06-08 MED ORDER — HYDRALAZINE HCL 10 MG PO TABS: 10.0000 mg | ORAL_TABLET | Freq: Three times a day (TID) | ORAL | Status: DC

## 2015-06-08 MED ORDER — ISOSORBIDE MONONITRATE ER 30 MG PO TB24: 15.0000 mg | ORAL_TABLET | Freq: Every day | ORAL | Status: DC

## 2015-06-08 NOTE — Assessment & Plan Note (Signed)
Continue his CPAP. I told him it would take him several months to get used to it but he would feel remarkably better and have more strength and energy.

## 2015-06-08 NOTE — Progress Notes (Signed)
Patient here for follow up for hypertension. Patient reports he feels better. Patient has not been monitoring his BP. Patient denies any pain today. Patient reports that he has taken his medications today. Patient is not taking potassium chloride.

## 2015-06-08 NOTE — Progress Notes (Signed)
Matthew [name] returns today for evaluation and management of his combined systolic and diastolic heart failure. He also has hypertension that is difficult to control, history of tobacco abuse, and morbid obesity.  He has been compliant with his meds since last visit. His weight is down about 2 pounds. He was diagnosed with severe sleep apnea after I referred him to pulmonary for consultation. He is now wearing CPAP. He is getting used to it.  He denies orthopnea, PND, palpitations, presyncope or syncope. He has occasional discomfort on the chest Osmany Azer which is probably muscular.  Exam today is remarkable for no JVD. Carotid upstrokes are equal bilaterally without bruits lungs are clear to auscultation. Heart reveals a poorly appreciated PMI is normal S1-S2 no gallop. Extremities shows reveal no edema.

## 2015-06-08 NOTE — Assessment & Plan Note (Signed)
We'll continue current medications except decrease hydralazine 3 times a day for convenience and compliance. In addition we will check metabolic profile to evaluate his potassium and renal function. He is not taking potassium and we've advised him to continue not take it. I will see him back again in 3 months. He has been eating a fair amount of fast food and we encouraged him to reduce this significantly and also to rinse any canned vegetables and try to eat frozen or fresh food.

## 2015-06-08 NOTE — Patient Instructions (Signed)
Thank you for coming in today. All of your medications have been filled. Your hydralazine 10mg  has been changed to three times a day. Please follow up with Dr. Verl Blalock in 3 months.

## 2015-06-08 NOTE — Assessment & Plan Note (Signed)
Metabolic profile for hyperkalemia and BUN and creatinine.

## 2015-06-09 LAB — BASIC METABOLIC PANEL
BUN: 13 mg/dL (ref 6–23)
CO2: 25 mEq/L (ref 19–32)
Calcium: 9.8 mg/dL (ref 8.4–10.5)
Chloride: 102 mEq/L (ref 96–112)
Creat: 1.37 mg/dL — ABNORMAL HIGH (ref 0.50–1.35)
Glucose, Bld: 136 mg/dL — ABNORMAL HIGH (ref 70–99)
Potassium: 4 mEq/L (ref 3.5–5.3)
Sodium: 139 mEq/L (ref 135–145)

## 2015-06-23 ENCOUNTER — Encounter: Payer: Self-pay | Admitting: Adult Health

## 2015-06-23 ENCOUNTER — Ambulatory Visit (INDEPENDENT_AMBULATORY_CARE_PROVIDER_SITE_OTHER): Payer: 59 | Admitting: Adult Health

## 2015-06-23 VITALS — BP 110/70 | HR 79 | Temp 98.0°F | Ht 70.0 in | Wt 281.0 lb

## 2015-06-23 DIAGNOSIS — G4733 Obstructive sleep apnea (adult) (pediatric): Secondary | ICD-10-CM

## 2015-06-23 NOTE — Patient Instructions (Signed)
Continue on CPAP At bedtime   Goal is at least 4-6 hrs each night  Work on weight loss.  Do not drive if sleepy . Follow up Dr. Elsworth Soho  In 4 months and As needed

## 2015-06-24 NOTE — Progress Notes (Signed)
   Subjective:    Patient ID: Matthew [name], male    DOB: Aug 14, 1966, 49 y.o.   MRN: [id number]  HPI 49 year old male with severe sleep apnea  01/2015 severe - AHI 85/h  06/23/15 follow-up sleep apnea RA patient here to discuss CPAP compliance. Pt states he uses CPAP 4 hours nightly, nasal pillows are working well. States he is resting much better. Download shows good compliance. Average usage is near 4 hours each night. AHI 1.0. Minimal leaks We discussed trying to increase his amount of usage each night.   Review of Systems Constitutional:   No  weight loss, night sweats,  Fevers, chills, fatigue, or  lassitude.  HEENT:   No headaches,  Difficulty swallowing,  Tooth/dental problems, or  Sore throat,                No sneezing, itching, ear ache, nasal congestion, post nasal drip,   CV:  No chest pain,  Orthopnea, PND, swelling in lower extremities, anasarca, dizziness, palpitations, syncope.   GI  No heartburn, indigestion, abdominal pain, nausea, vomiting, diarrhea, change in bowel habits, loss of appetite, bloody stools.   Resp: No shortness of breath with exertion or at rest.  No excess mucus, no productive cough,  No non-productive cough,  No coughing up of blood.  No change in color of mucus.  No wheezing.  No chest wall deformity  Skin: no rash or lesions.  GU: no dysuria, change in color of urine, no urgency or frequency.  No flank pain, no hematuria   MS:  No joint pain or swelling.  No decreased range of motion.  No back pain.  Psych:  No change in mood or affect. No depression or anxiety.  No memory loss.         Objective:   Physical Exam GEN: A/Ox3; pleasant , NAD, morbidly obese  HEENT:  [place]/AT,  EACs-clear, TMs-wnl, NOSE-clear, THROAT-clear, no lesions, no postnasal drip or exudate noted. Class 2-3 airway  NECK:  Supple w/ fair ROM; no JVD; normal carotid impulses w/o bruits; no thyromegaly or nodules palpated; no lymphadenopathy.  RESP  Clear  P & A; w/o,  wheezes/ rales/ or rhonchi.no accessory muscle use, no dullness to percussion  CARD:  RRR, no m/r/g  , trace peripheral edema, pulses intact, no cyanosis or clubbing.  GI:   Soft & nt; nml bowel sounds; no organomegaly or masses detected.  Musco: Warm bil, no deformities or joint swelling noted.   Neuro: alert, no focal deficits noted.    Skin: Warm, no lesions or rashes         Assessment & Plan:

## 2015-06-27 NOTE — Assessment & Plan Note (Signed)
Encouraged on weight loss

## 2015-06-27 NOTE — Assessment & Plan Note (Signed)
Compensated on present regimen Encouraged increased number of hours each night.  Plan Continue on CPAP At bedtime   Goal is at least 4-6 hrs each night  Work on weight loss.  Do not drive if sleepy . Follow up Dr. Elsworth Soho  In 4 months and As needed

## 2015-07-21 ENCOUNTER — Ambulatory Visit (HOSPITAL_BASED_OUTPATIENT_CLINIC_OR_DEPARTMENT_OTHER): Payer: 59 | Attending: Pulmonary Disease | Admitting: Radiology

## 2015-07-21 DIAGNOSIS — G4730 Sleep apnea, unspecified: Secondary | ICD-10-CM | POA: Diagnosis present

## 2015-07-21 DIAGNOSIS — G4733 Obstructive sleep apnea (adult) (pediatric): Secondary | ICD-10-CM

## 2015-07-27 ENCOUNTER — Telehealth: Payer: Self-pay | Admitting: Pulmonary Disease

## 2015-07-27 DIAGNOSIS — G4730 Sleep apnea, unspecified: Secondary | ICD-10-CM | POA: Diagnosis not present

## 2015-07-27 NOTE — Progress Notes (Signed)
Patient Name: Matthew [name], Matthew [name] Date: 07/21/2015 Gender: Male D.O.B: Aug 12, 1966 Age (years): 70 Referring Provider: Kara Mead MD, ABSM Height (inches): 67 Interpreting Physician: Kara Mead MD, ABSM Weight (lbs): 175 RPSGT: Zadie Rhine BMI: 27 MRN: [id number] Neck Size: 19.50  CLINICAL INFORMATION The patient is referred for a CPAP titration to treat sleep apnea.  Date of NPSG : 01/2015 severe - AHI 85/h  SLEEP STUDY TECHNIQUE As per the AASM Manual for the Scoring of Sleep and Associated Events v2.3 (April 2016) with a hypopnea requiring 4% desaturations.  The channels recorded and monitored were frontal, central and occipital EEG, electrooculogram (EOG), submentalis EMG (chin), nasal and oral airflow, thoracic and abdominal wall motion, anterior tibialis EMG, snore microphone, electrocardiogram, and pulse oximetry. Continuous positive airway pressure (CPAP) was initiated at the beginning of the study and titrated to treat sleep-disordered breathing.  MEDICATIONS Medications administered by patient during sleep study : No sleep medicine administered.   RESPIRATORY PARAMETERS Optimal PAP Pressure (cm): 20 AHI at Optimal Pressure (/hr): 0.0 Overall Minimal O2 (%): 88.00 Supine % at Optimal Pressure (%): 11 Minimal O2 at Optimal Pressure (%): 95.0    SLEEP ARCHITECTURE The study was initiated at 9:59:18 PM and ended at 4:15:31 AM.  Sleep onset time was 0.3 minutes and the sleep efficiency was 90.6%. The total sleep time was 341.0 minutes.  The patient spent 2.35% of the night in stage N1 sleep, 53.08% in stage N2 sleep, 0.00% in stage N3 and 44.57% in REM.Stage REM latency was 71.5 minutes  Wake after sleep onset was 34.9. Alpha intrusion was absent. Supine sleep was 40.03%.  CARDIAC DATA The 2 lead EKG demonstrated sinus rhythm. The mean heart rate was 73.33 beats per minute. Other EKG findings include: None.  LEG MOVEMENT DATA The total Periodic Limb Movements of  Sleep (PLMS) were 0. The PLMS index was 0.00. A PLMS index of <15 is considered normal in adults.  IMPRESSIONS The optimal PAP pressure was 20 cm of water. Central sleep apnea was not noted during this titration (CAI = 0.0/h). Severe oxygen desaturations were observed during this titration (min O2 = 88.00%). No snoring was audible during this study. No cardiac abnormalities were observed during this study. Clinically significant periodic limb movements were not noted during this study. Arousals associated with PLMs were rare.  DIAGNOSIS Obstructive Sleep Apnea (327.23 [G47.33 ICD-10])  RECOMMENDATIONS Trial of CPAP therapy on 20 cm H2O with a Medium size Resmed Full Face Mask Quattro Air mask and heated humidification. Alternatively auto CPAP can be tried. Avoid alcohol, sedatives and other CNS depressants that may worsen sleep apnea and disrupt normal sleep architecture. Sleep hygiene should be reviewed to assess factors that may improve sleep quality. Weight management and regular exercise should be initiated or continued. Return for re-evaluation after 4 weeks of therapy  Kara Mead MD. FCCP. [facility]

## 2015-07-27 NOTE — Telephone Encounter (Signed)
CPAP 20 cm was required during titration study Stay on current auto CPAP settings

## 2015-07-27 NOTE — Addendum Note (Signed)
Addended by: Rigoberto Noel on: 07/27/2015 05:22 PM   Modules accepted: Level of Service

## 2015-07-28 NOTE — Telephone Encounter (Signed)
lmtcb

## 2015-07-29 NOTE — Telephone Encounter (Signed)
lmtcb

## 2015-08-01 NOTE — Telephone Encounter (Signed)
lmtcb

## 2015-08-18 ENCOUNTER — Encounter: Payer: Self-pay | Admitting: Pulmonary Disease

## 2015-08-24 ENCOUNTER — Encounter: Payer: Self-pay | Admitting: Cardiology

## 2015-08-24 ENCOUNTER — Ambulatory Visit: Payer: 59 | Attending: Cardiology | Admitting: Cardiology

## 2015-08-24 VITALS — BP 123/75 | HR 80 | Temp 98.4°F | Resp 18 | Ht 70.0 in | Wt 278.2 lb

## 2015-08-24 DIAGNOSIS — I5042 Chronic combined systolic (congestive) and diastolic (congestive) heart failure: Secondary | ICD-10-CM | POA: Insufficient documentation

## 2015-08-24 DIAGNOSIS — Z6839 Body mass index (BMI) 39.0-39.9, adult: Secondary | ICD-10-CM | POA: Diagnosis not present

## 2015-08-24 DIAGNOSIS — N182 Chronic kidney disease, stage 2 (mild): Secondary | ICD-10-CM

## 2015-08-24 DIAGNOSIS — I429 Cardiomyopathy, unspecified: Secondary | ICD-10-CM | POA: Diagnosis not present

## 2015-08-24 DIAGNOSIS — G4733 Obstructive sleep apnea (adult) (pediatric): Secondary | ICD-10-CM | POA: Diagnosis not present

## 2015-08-24 DIAGNOSIS — Z87891 Personal history of nicotine dependence: Secondary | ICD-10-CM | POA: Insufficient documentation

## 2015-08-24 DIAGNOSIS — I428 Other cardiomyopathies: Secondary | ICD-10-CM

## 2015-08-24 NOTE — Progress Notes (Signed)
Patient here for three month follow-up. Patient denies any pain today. Patient denies SOB, chest pain, wheezing and swelling. Patient has not been monitoring his blood pressure.  Patient states that he has cut back on fast food since his last visit with Dr. Verl Blalock. Patient has taken his medications today.

## 2015-08-24 NOTE — Assessment & Plan Note (Signed)
He is stable on medical management. He's lost a couple pounds. Symptomatically is improved. He continues to be employed.  We'll arrange for fasting lipids and a compress metabolic profile. No change in medications. His last echocardiogram was in March 2015 mL reschedule follow-up. Hopefully his ejection fraction is improved.

## 2015-08-24 NOTE — Assessment & Plan Note (Signed)
His lost a few pounds. I've encouraged to continue to avoid fast food.

## 2015-08-24 NOTE — Progress Notes (Signed)
Matthew [name] returns today for evaluation and management of his chronic systolic and diastolic heart failure. He's lost a few pounds and is feeling better. He denies any orthopnea, PND or edema.  He is overdue a follow-up echocardiogram to assess his LV function as well as fasting lipids. His diabetes is controlled with diet.  Exam he is in no acute distress. He's overweight. Neck shows no JVD. Carotids are full without bruits. Lungs are clear to auscultation. Cardiac exam reveals a poorly appreciated PMI with a soft S1-S2 no gallop. Abdominal exam slightly distended with good bowel sounds. Extremities: No edema. Pulses are intact.

## 2015-08-24 NOTE — Patient Instructions (Addendum)
Thank you for visiting with Dr. Verl Blalock today. Please schedule a LAB visit for your blood work as soon as possible. Please remember to have nothing by mouth after 12:00 am the day of the lab visit.

## 2015-08-25 ENCOUNTER — Ambulatory Visit: Payer: 59

## 2015-08-25 LAB — LIPID PANEL
Cholesterol: 161 mg/dL (ref 125–200)
HDL: 28 mg/dL — ABNORMAL LOW (ref >=40)
LDL Cholesterol: 81 mg/dL (ref <130)
Total CHOL/HDL Ratio: 5.8 Ratio — ABNORMAL HIGH (ref <=5.0)
Triglycerides: 258 mg/dL — ABNORMAL HIGH (ref <150)
VLDL: 52 mg/dL — ABNORMAL HIGH (ref <30)

## 2015-08-25 LAB — COMPLETE METABOLIC PANEL WITH GFR
ALT: 38 U/L (ref 9–46)
AST: 26 U/L (ref 10–40)
Albumin: 4.2 g/dL (ref 3.6–5.1)
Alkaline Phosphatase: 58 U/L (ref 40–115)
BUN: 12 mg/dL (ref 7–25)
CO2: 25 mmol/L (ref 20–31)
Calcium: 9.1 mg/dL (ref 8.6–10.3)
Chloride: 101 mmol/L (ref 98–110)
Creat: 1.27 mg/dL (ref 0.60–1.35)
GFR, Est African American: 77 mL/min (ref >=60)
GFR, Est Non African American: 66 mL/min (ref >=60)
Glucose, Bld: 169 mg/dL — ABNORMAL HIGH (ref 65–99)
Potassium: 4.3 mmol/L (ref 3.5–5.3)
Sodium: 140 mmol/L (ref 135–146)
Total Bilirubin: 0.3 mg/dL (ref 0.2–1.2)
Total Protein: 6.7 g/dL (ref 6.1–8.1)

## 2015-08-26 ENCOUNTER — Ambulatory Visit (HOSPITAL_COMMUNITY)
Admission: RE | Admit: 2015-08-26 | Discharge: 2015-08-26 | Disposition: A | Discharge status: Home / Self Care | Payer: 59 | Source: Ambulatory Visit | Attending: Cardiology | Admitting: Cardiology

## 2015-08-26 DIAGNOSIS — I129 Hypertensive chronic kidney disease with stage 1 through stage 4 chronic kidney disease, or unspecified chronic kidney disease: Secondary | ICD-10-CM | POA: Insufficient documentation

## 2015-08-26 DIAGNOSIS — I509 Heart failure, unspecified: Secondary | ICD-10-CM | POA: Diagnosis present

## 2015-08-26 DIAGNOSIS — I5042 Chronic combined systolic (congestive) and diastolic (congestive) heart failure: Secondary | ICD-10-CM | POA: Diagnosis not present

## 2015-08-26 DIAGNOSIS — I517 Cardiomegaly: Secondary | ICD-10-CM | POA: Insufficient documentation

## 2015-08-26 DIAGNOSIS — Z87891 Personal history of nicotine dependence: Secondary | ICD-10-CM | POA: Insufficient documentation

## 2015-08-26 DIAGNOSIS — N189 Chronic kidney disease, unspecified: Secondary | ICD-10-CM | POA: Insufficient documentation

## 2015-08-26 DIAGNOSIS — E119 Type 2 diabetes mellitus without complications: Secondary | ICD-10-CM | POA: Insufficient documentation

## 2015-08-26 NOTE — Progress Notes (Signed)
*  PRELIMINARY RESULTS* Echocardiogram 2D Echocardiogram has been performed.  Leavy Cella 08/26/2015, 3:54 PM

## 2015-09-14 ENCOUNTER — Telehealth: Payer: Self-pay | Admitting: *Deleted

## 2015-09-14 NOTE — Telephone Encounter (Signed)
-----   Message from Renella Cunas, MD sent at 09/14/2015  3:59 PM EDT ----- His left ventricular function has improved. Continue weight reduction and current medication regimen.

## 2015-09-14 NOTE — Telephone Encounter (Signed)
Patient verified DOB Patient made aware of left ventricular improvement. Patient advised to continue working on losing weight and continuing his current medication regimen. Patient had no further questions.

## 2015-09-14 NOTE — Telephone Encounter (Signed)
Patient verified DOB Patient is aware of triglycerides being elevated due to obesity and carb diet. Patient was advised to continue lessening carb intake to assist in losing weight. Patient aware of LDL being at goal level and no changes were made to his medications. Patient had no further questions.

## 2015-09-14 NOTE — Telephone Encounter (Signed)
-----   Message from Renella Cunas, MD sent at 09/14/2015  3:55 PM EDT ----- Triglycerides are elevated secondary to obesity and probably high carbohydrate diet. Please advise on losing weight and avoiding carbohydrates as much as possible. His LDL is at goal on his statin. No changes in drug therapy.

## 2016-01-05 MED FILL — ISOSORBIDE MN ER 30 MG TAB: 30 | 60 days supply | Qty: 30 | Fill #3

## 2016-01-05 MED FILL — LISINOPRIL 5 MG TABLET: 5 | 30 days supply | Qty: 30 | Fill #5

## 2016-01-05 MED FILL — SIMVASTATIN 20 MG TABLET: 20 | 30 days supply | Qty: 30 | Fill #3

## 2016-01-05 MED FILL — SPIRONOLACTONE 25 MG TABLET: 25 | 30 days supply | Qty: 30 | Fill #5

## 2016-01-18 MED FILL — hydrALAZINE HCL 10 MG TABS: 10 | 30 days supply | Qty: 90 | Fill #3

## 2016-02-15 MED FILL — ?SPIRONOLACTONE 25 MG TABLE: 25 | 30 days supply | Qty: 30 | Fill #6

## 2016-02-15 MED FILL — LISINOPRIL 5 MG TABLET: 5 | 30 days supply | Qty: 30 | Fill #6

## 2016-02-15 MED FILL — FUROSEMIDE 40 MG TABLET: 40 | 30 days supply | Qty: 60 | Fill #5

## 2016-02-15 MED FILL — SIMVASTATIN 20 MG TABLET: 20 | 30 days supply | Qty: 30 | Fill #4

## 2016-02-16 MED FILL — hydrALAZINE HCL 10 MG TABS: 10 | 30 days supply | Qty: 90 | Fill #4

## 2016-03-23 MED FILL — ISOSORBIDE MN ER 30 MG TAB: 30 | 60 days supply | Qty: 30 | Fill #4

## 2016-03-23 MED FILL — LISINOPRIL 5 MG TABLET: 5 | 30 days supply | Qty: 30 | Fill #7

## 2016-03-23 MED FILL — ?SPIRONOLACTONE 25 MG TABLE: 25 | 30 days supply | Qty: 30 | Fill #7

## 2016-03-23 MED FILL — ?FUROSEMIDE 40 MG TABLET: 40 | 30 days supply | Qty: 60 | Fill #6

## 2016-05-17 MED FILL — SPIRONOLACTONE 25 MG TABLET: 25 | 30 days supply | Qty: 30 | Fill #8

## 2016-05-17 MED FILL — LISINOPRIL 5 MG TABLET: 5 | 30 days supply | Qty: 30 | Fill #8

## 2016-05-17 MED FILL — FUROSEMIDE 40 MG TABLET: 40 | 30 days supply | Qty: 60 | Fill #7

## 2016-05-17 MED FILL — SIMVASTATIN 20 MG TABLET: 20 | 30 days supply | Qty: 30 | Fill #5

## 2016-05-17 MED FILL — hydrALAZINE HCL 10 MG TABS: 10 | 30 days supply | Qty: 90 | Fill #5

## 2016-07-11 ENCOUNTER — Telehealth: Payer: Self-pay | Admitting: Internal Medicine

## 2016-07-11 DIAGNOSIS — I5042 Chronic combined systolic (congestive) and diastolic (congestive) heart failure: Secondary | ICD-10-CM

## 2016-07-11 DIAGNOSIS — I10 Essential (primary) hypertension: Secondary | ICD-10-CM

## 2016-07-11 MED ORDER — SIMVASTATIN 20 MG PO TABS: 20.0000 mg | ORAL_TABLET | Freq: Every day | ORAL | 0 refills | Status: DC

## 2016-07-11 MED ORDER — HYDRALAZINE HCL 10 MG PO TABS: 10.0000 mg | ORAL_TABLET | Freq: Three times a day (TID) | ORAL | 0 refills | Status: DC

## 2016-07-11 MED ORDER — CARVEDILOL 6.25 MG PO TABS: 6.2500 mg | ORAL_TABLET | Freq: Two times a day (BID) | ORAL | 0 refills | Status: DC

## 2016-07-11 MED ORDER — SPIRONOLACTONE 25 MG PO TABS: 25.0000 mg | ORAL_TABLET | Freq: Every day | ORAL | 0 refills | Status: DC

## 2016-07-11 MED ORDER — FUROSEMIDE 40 MG PO TABS: 40.0000 mg | ORAL_TABLET | Freq: Two times a day (BID) | ORAL | 0 refills | Status: DC

## 2016-07-11 MED ORDER — ISOSORBIDE MONONITRATE ER 30 MG PO TB24: 15.0000 mg | ORAL_TABLET | Freq: Every day | ORAL | 0 refills | Status: DC

## 2016-07-11 MED ORDER — LISINOPRIL 5 MG PO TABS: 5.0000 mg | ORAL_TABLET | Freq: Every day | ORAL | 0 refills | Status: DC

## 2016-07-11 MED FILL — ?SPIRONOLACTONE 25 MG TABLE: 25 | 30 days supply | Qty: 30 | Fill #0

## 2016-07-11 MED FILL — FUROSEMIDE 40 MG TABLET: 40 | 30 days supply | Qty: 60 | Fill #0

## 2016-07-11 MED FILL — hydrALAZINE HCL 10 MG TABS: 10 | 30 days supply | Qty: 90 | Fill #0

## 2016-07-11 MED FILL — LISINOPRIL 5 MG TABLET: 5 | 30 days supply | Qty: 30 | Fill #0

## 2016-07-11 MED FILL — ISOSORBIDE MN ER 30 MG TAB: 30 | 60 days supply | Qty: 30 | Fill #0

## 2016-07-11 MED FILL — ?SIMVASTATIN 20 MG TABLET: 20 MG | 30 days supply | Qty: 30 | Fill #0

## 2016-07-11 NOTE — Telephone Encounter (Signed)
Pt. Called requesting a refill on all his current medication. Pt. Used to see Dr. Verl Blalock and does not have anymore refills. Pt. Is going to schedule an appointment with a new PCP. Please f/u

## 2016-07-11 NOTE — Telephone Encounter (Signed)
Requested medications refilled - patient needs office visit for future refills

## 2016-07-30 ENCOUNTER — Ambulatory Visit: Payer: 59 | Attending: Family Medicine | Admitting: Family Medicine

## 2016-07-30 ENCOUNTER — Encounter: Payer: Self-pay | Admitting: Family Medicine

## 2016-07-30 VITALS — BP 114/64 | HR 74 | Temp 98.5°F | Ht 70.0 in | Wt 254.0 lb

## 2016-07-30 DIAGNOSIS — I10 Essential (primary) hypertension: Secondary | ICD-10-CM | POA: Diagnosis not present

## 2016-07-30 DIAGNOSIS — N528 Other male erectile dysfunction: Secondary | ICD-10-CM

## 2016-07-30 DIAGNOSIS — I5042 Chronic combined systolic (congestive) and diastolic (congestive) heart failure: Secondary | ICD-10-CM | POA: Diagnosis not present

## 2016-07-30 DIAGNOSIS — N529 Male erectile dysfunction, unspecified: Secondary | ICD-10-CM | POA: Insufficient documentation

## 2016-07-30 DIAGNOSIS — Z79899 Other long term (current) drug therapy: Secondary | ICD-10-CM | POA: Insufficient documentation

## 2016-07-30 DIAGNOSIS — R202 Paresthesia of skin: Secondary | ICD-10-CM

## 2016-07-30 DIAGNOSIS — R51 Headache: Secondary | ICD-10-CM | POA: Diagnosis not present

## 2016-07-30 DIAGNOSIS — E119 Type 2 diabetes mellitus without complications: Secondary | ICD-10-CM | POA: Insufficient documentation

## 2016-07-30 DIAGNOSIS — G44209 Tension-type headache, unspecified, not intractable: Secondary | ICD-10-CM

## 2016-07-30 DIAGNOSIS — R209 Unspecified disturbances of skin sensation: Secondary | ICD-10-CM | POA: Insufficient documentation

## 2016-07-30 LAB — POCT GLYCOSYLATED HEMOGLOBIN (HGB A1C): Hemoglobin A1C: 11.9

## 2016-07-30 MED ORDER — GLIPIZIDE 5 MG PO TABS: 5.0000 mg | ORAL_TABLET | Freq: Two times a day (BID) | ORAL | 3 refills | Status: DC

## 2016-07-30 MED ORDER — ONETOUCH ULTRA SYSTEM W/DEVICE KIT: 1.0000 | PACK | Freq: Once | 5 refills | Status: AC

## 2016-07-30 NOTE — Progress Notes (Signed)
Establish care Medication refills Erectile dysfunction Has developed headaches over the last month "pins and needles in left hand" for a couple of months

## 2016-07-30 NOTE — Patient Instructions (Signed)
Diabetes Mellitus and Food It is important for you to manage your blood sugar (glucose) level. Your blood glucose level can be greatly affected by what you eat. Eating healthier foods in the appropriate amounts throughout the day at about the same time each day will help you control your blood glucose level. It can also help slow or prevent worsening of your diabetes mellitus. Healthy eating may even help you improve the level of your blood pressure and reach or maintain a healthy weight.  General recommendations for healthful eating and cooking habits include:  Eating meals and snacks regularly. Avoid going long periods of time without eating to lose weight.  Eating a diet that consists mainly of plant-based foods, such as fruits, vegetables, nuts, legumes, and whole grains.  Using low-heat cooking methods, such as baking, instead of high-heat cooking methods, such as deep frying. Work with your dietitian to make sure you understand how to use the Nutrition Facts information on food labels. HOW CAN FOOD AFFECT ME? Carbohydrates Carbohydrates affect your blood glucose level more than any other type of food. Your dietitian will help you determine how many carbohydrates to eat at each meal and teach you how to count carbohydrates. Counting carbohydrates is important to keep your blood glucose at a healthy level, especially if you are using insulin or taking certain medicines for diabetes mellitus. Alcohol Alcohol can cause sudden decreases in blood glucose (hypoglycemia), especially if you use insulin or take certain medicines for diabetes mellitus. Hypoglycemia can be a life-threatening condition. Symptoms of hypoglycemia (sleepiness, dizziness, and disorientation) are similar to symptoms of having too much alcohol.  If your health care provider has given you approval to drink alcohol, do so in moderation and use the following guidelines:  Women should not have more than one drink per day, and men  should not have more than two drinks per day. One drink is equal to:  12 oz of beer.  5 oz of wine.  1 oz of hard liquor.  Do not drink on an empty stomach.  Keep yourself hydrated. Have water, diet soda, or unsweetened iced tea.  Regular soda, juice, and other mixers might contain a lot of carbohydrates and should be counted. WHAT FOODS ARE NOT RECOMMENDED? As you make food choices, it is important to remember that all foods are not the same. Some foods have fewer nutrients per serving than other foods, even though they might have the same number of calories or carbohydrates. It is difficult to get your body what it needs when you eat foods with fewer nutrients. Examples of foods that you should avoid that are high in calories and carbohydrates but low in nutrients include:  Trans fats (most processed foods list trans fats on the Nutrition Facts label).  Regular soda.  Juice.  Candy.  Sweets, such as cake, pie, doughnuts, and cookies.  Fried foods. WHAT FOODS CAN I EAT? Eat nutrient-rich foods, which will nourish your body and keep you healthy. The food you should eat also will depend on several factors, including:  The calories you need.  The medicines you take.  Your weight.  Your blood glucose level.  Your blood pressure level.  Your cholesterol level. You should eat a variety of foods, including:  Protein.  Lean cuts of meat.  Proteins low in saturated fats, such as fish, egg whites, and beans. Avoid processed meats.  Fruits and vegetables.  Fruits and vegetables that may help control blood glucose levels, such as apples, mangoes, and   yams.  Dairy products.  Choose fat-free or low-fat dairy products, such as milk, yogurt, and cheese.  Grains, bread, pasta, and rice.  Choose whole grain products, such as multigrain bread, whole oats, and brown rice. These foods may help control blood pressure.  Fats.  Foods containing healthful fats, such as nuts,  avocado, olive oil, canola oil, and fish. DOES EVERYONE WITH DIABETES MELLITUS HAVE THE SAME MEAL PLAN? Because every person with diabetes mellitus is different, there is not one meal plan that works for everyone. It is very important that you meet with a dietitian who will help you create a meal plan that is just right for you.   This information is not intended to replace advice given to you by your health care provider. Make sure you discuss any questions you have with your health care provider.   Document Released: 08/30/2005 Document Revised: 12/24/2014 Document Reviewed: 10/30/2013 Elsevier Interactive Patient Education 2016 Elsevier Inc.  

## 2016-07-30 NOTE — Progress Notes (Signed)
Subjective:  Patient ID: Matthew [name], male    DOB: 12-17-1966  Age: 50 y.o. MRN: [id number]  CC: Establish Care; Hypertension; and Congestive Heart Failure   HPI Matthew [name] he is a 50 year old male with a history of hypertension, chronic combined systolic and diastolic congestive heart failure (EF 45% from 08/2015, previously followed by Dr. Verl [name]), diabetes mellitus (A1c was 6.6 in 11/2014 but he was not on medications and is now 11.9 today).  He has been compliant with his medications and denies chest pains, shortness of breath, orthopnea and has no pedal edema.  He complains of pins and needles in his left hand which has been intermittent. His chart reveals a diagnosis of diabetes mellitus with an A1c of 6.6 and 11/2014 which the patient states he was unaware of and he has not been on any diabetic medications. Complains of erectile dysfunction which she would like to have treated.  He has also had occasional headaches which are frontal and intermittent for the last 1 month.  Outpatient Medications Prior to Visit  Medication Sig Dispense Refill  . furosemide (LASIX) 40 MG tablet Take 1 tablet (40 mg total) by mouth 2 (two) times daily. 60 tablet 0  . hydrALAZINE (APRESOLINE) 10 MG tablet Take 1 tablet (10 mg total) by mouth 3 (three) times daily. 90 tablet 0  . isosorbide mononitrate (IMDUR) 30 MG 24 hr tablet Take 0.5 tablets (15 mg total) by mouth daily. 30 tablet 0  . lisinopril (PRINIVIL,ZESTRIL) 5 MG tablet Take 1 tablet (5 mg total) by mouth daily. 30 tablet 0  . simvastatin (ZOCOR) 20 MG tablet Take 1 tablet (20 mg total) by mouth at bedtime. 30 tablet 0  . spironolactone (ALDACTONE) 25 MG tablet Take 1 tablet (25 mg total) by mouth daily. 30 tablet 0  . carvedilol (COREG) 6.25 MG tablet Take 1 tablet (6.25 mg total) by mouth 2 (two) times daily with a meal. (Patient not taking: Reported on 07/30/2016) 60 tablet 0  . Potassium Chloride ER 20 MEQ TBCR TAKE 1 TABLET BY MOUTH ONCE  DAILY (Patient not taking: Reported on 08/24/2015) 30 tablet 1   No facility-administered medications prior to visit.     ROS Review of Systems  Constitutional: Negative for activity change and appetite change.  HENT: Negative for sinus pressure and sore throat.   Eyes: Negative for visual disturbance.  Respiratory: Negative for cough, chest tightness and shortness of breath.   Cardiovascular: Negative for chest pain and leg swelling.  Gastrointestinal: Negative for abdominal distention, abdominal pain, constipation and diarrhea.  Endocrine: Negative.   Genitourinary: Negative for dysuria.       ED  Musculoskeletal: Negative for joint swelling and myalgias.  Skin: Negative for rash.  Allergic/Immunologic: Negative.   Neurological: Positive for numbness and headaches. Negative for weakness and light-headedness.  Psychiatric/Behavioral: Negative for dysphoric mood and suicidal ideas.    Objective:  BP 114/64 (BP Location: Right Arm, Patient Position: Sitting, Cuff Size: Large)   Pulse 74   Temp 98.5 F (36.9 C) (Oral)   Ht 5\' 10"  (1.778 m)   Wt 254 lb (115.2 kg)   SpO2 98%   BMI 36.45 kg/m   BP/Weight 07/30/2016 [id number]  Systolic BP [id number] -  Diastolic BP 64 75 -  Wt. (Lbs) 254 278.2 280  BMI 36.45 39.92 40.18      Physical Exam  Constitutional: He is oriented to person, place, and time. He appears well-developed and well-nourished.  Cardiovascular: Normal rate, normal  heart sounds and intact distal pulses.   No murmur heard. Pulmonary/Chest: Effort normal and breath sounds normal. He has no wheezes. He has no rales. He exhibits no tenderness.  Abdominal: Soft. Bowel sounds are normal. He exhibits no distension and no mass. There is no tenderness.  Musculoskeletal: Normal range of motion.  Neurological: He is alert and oriented to person, place, and time.  Skin: Skin is warm and dry.  Psychiatric: He has a normal mood and affect.   Lab Results  Component  Value Date   HGBA1C 11.9 07/30/2016    CMP Latest Ref Rng & Units 08/25/2015 06/08/2015 03/24/2014  Glucose 65 - 99 mg/dL 169(H) 136(H) 115(H)  BUN 7 - 25 mg/dL 12 13 17   Creatinine 0.60 - 1.35 mg/dL 1.27 1.37(H) 1.41(H)  Sodium 135 - 146 mmol/L 140 139 138  Potassium 3.5 - 5.3 mmol/L 4.3 4.0 4.2  Chloride 98 - 110 mmol/L 101 102 102  CO2 20 - 31 mmol/L 25 25 28   Calcium 8.6 - 10.3 mg/dL 9.1 9.8 9.3  Total Protein 6.1 - 8.1 g/dL 6.7 - -  Total Bilirubin 0.2 - 1.2 mg/dL 0.3 - -  Alkaline Phos 40 - 115 U/L 58 - -  AST 10 - 40 U/L 26 - -  ALT 9 - 46 U/L 38 - -    Lipid Panel     Component Value Date/Time   CHOL 161 08/25/2015 0905   TRIG 258 (H) 08/25/2015 0905   HDL 28 (L) 08/25/2015 0905   CHOLHDL 5.8 (H) 08/25/2015 0905   VLDL 52 (H) 08/25/2015 0905   LDLCALC 81 08/25/2015 0905      Assessment & Plan:   1. Uncontrolled type 2 diabetes mellitus without complication, without long-term current use of insulin ([place]) Uncontrolled with A1c of 11.9 due to not being on medications and patient's lack of awareness. Seen by clinical pharmacist today. Commenced on glipizide Prescription for meter and testing supplies written; goals for blood sugar discussed: Fasting 80-120, random less than 200 Patient to see clinical pharmacist today for diabetic teaching - Microalbumin / creatinine urine ratio; Future  2. Essential hypertension Controlled - COMPLETE METABOLIC PANEL WITH GFR; Future - Lipid panel; Future  3. Chronic combined systolic and diastolic congestive heart failure (HCC) EF 40-45% Euvolemic Chart reveals he is supposed to be on carvedilol however he denies taking it as he states Dr. Verl [name] took him off that. I have discussed with him the importance of being on a beta blocker however he has a soft blood pressure and I will defer this decision to cardiology. - Ambulatory referral to Cardiology  4. Other male erectile dysfunction I have explained to him that given he is on  nitrates this is a contraindication to use of erectile enhancing medications. He will be discussing with his cardiologist to see if it is safe for him to be taken off nitrates  5. Paresthesia Likely secondary to diabetic neuropathy We'll reassess at next visit for the need to initiate gabapentin  6. Tension-type headache, not intractable, unspecified chronicity pattern To use OTC antihistamines If symptoms persist will consider discontinuing hydralazine.   No orders of the defined types were placed in this encounter.   Follow-up: 3 weeks for follow up of Diabetes mellitus   Arnoldo Morale MD

## 2016-09-05 ENCOUNTER — Emergency Department (HOSPITAL_COMMUNITY): Payer: 59

## 2016-09-05 ENCOUNTER — Encounter (HOSPITAL_COMMUNITY): Payer: Self-pay

## 2016-09-05 ENCOUNTER — Emergency Department (HOSPITAL_COMMUNITY)
Admission: EM | Admit: 2016-09-05 | Discharge: 2016-09-05 | Disposition: A | Discharge status: Home / Self Care | Payer: 59 | Attending: Emergency Medicine | Admitting: Emergency Medicine

## 2016-09-05 DIAGNOSIS — Y92410 Unspecified street and highway as the place of occurrence of the external cause: Secondary | ICD-10-CM | POA: Insufficient documentation

## 2016-09-05 DIAGNOSIS — Y999 Unspecified external cause status: Secondary | ICD-10-CM | POA: Diagnosis not present

## 2016-09-05 DIAGNOSIS — R0789 Other chest pain: Secondary | ICD-10-CM

## 2016-09-05 DIAGNOSIS — M545 Low back pain, unspecified: Secondary | ICD-10-CM

## 2016-09-05 DIAGNOSIS — I5042 Chronic combined systolic (congestive) and diastolic (congestive) heart failure: Secondary | ICD-10-CM | POA: Diagnosis not present

## 2016-09-05 DIAGNOSIS — S161XXA Strain of muscle, fascia and tendon at neck level, initial encounter: Secondary | ICD-10-CM | POA: Diagnosis not present

## 2016-09-05 DIAGNOSIS — Z87891 Personal history of nicotine dependence: Secondary | ICD-10-CM | POA: Insufficient documentation

## 2016-09-05 DIAGNOSIS — Z7984 Long term (current) use of oral hypoglycemic drugs: Secondary | ICD-10-CM | POA: Diagnosis not present

## 2016-09-05 DIAGNOSIS — E119 Type 2 diabetes mellitus without complications: Secondary | ICD-10-CM | POA: Insufficient documentation

## 2016-09-05 DIAGNOSIS — Y939 Activity, unspecified: Secondary | ICD-10-CM | POA: Insufficient documentation

## 2016-09-05 DIAGNOSIS — I130 Hypertensive heart and chronic kidney disease with heart failure and stage 1 through stage 4 chronic kidney disease, or unspecified chronic kidney disease: Secondary | ICD-10-CM | POA: Diagnosis not present

## 2016-09-05 DIAGNOSIS — N182 Chronic kidney disease, stage 2 (mild): Secondary | ICD-10-CM | POA: Insufficient documentation

## 2016-09-05 DIAGNOSIS — S199XXA Unspecified injury of neck, initial encounter: Secondary | ICD-10-CM | POA: Diagnosis present

## 2016-09-05 LAB — BASIC METABOLIC PANEL
Anion gap: 7 (ref 5–15)
BUN: 10 mg/dL (ref 6–20)
CO2: 25 mmol/L (ref 22–32)
Calcium: 9.4 mg/dL (ref 8.9–10.3)
Chloride: 103 mmol/L (ref 101–111)
Creatinine, Ser: 1.19 mg/dL (ref 0.61–1.24)
GFR calc Af Amer: >60 mL/min (ref >60)
GFR calc non Af Amer: >60 mL/min (ref >60)
Glucose, Bld: 211 mg/dL — ABNORMAL HIGH (ref 65–99)
Potassium: 3.7 mmol/L (ref 3.5–5.1)
Sodium: 135 mmol/L (ref 135–145)

## 2016-09-05 LAB — CBC
HCT: 43 % (ref 39.0–52.0)
Hemoglobin: 14.9 g/dL (ref 13.0–17.0)
MCH: 29.9 pg (ref 26.0–34.0)
MCHC: 34.7 g/dL (ref 30.0–36.0)
MCV: 86.3 fL (ref 78.0–100.0)
Platelets: 258 10*3/uL (ref 150–400)
RBC: 4.98 MIL/uL (ref 4.22–5.81)
RDW: 13.4 % (ref 11.5–15.5)
WBC: 5.4 10*3/uL (ref 4.0–10.5)

## 2016-09-05 LAB — I-STAT TROPONIN, ED
Troponin i, poc: 0.03 ng/mL (ref 0.00–0.08)
Troponin i, poc: 0 ng/mL (ref 0.00–0.08)

## 2016-09-05 MED ORDER — METHOCARBAMOL 500 MG PO TABS: 500.0000 mg | ORAL_TABLET | Freq: Two times a day (BID) | ORAL | 0 refills | Status: DC

## 2016-09-05 MED ORDER — NAPROXEN 500 MG PO TABS: 500.0000 mg | ORAL_TABLET | Freq: Two times a day (BID) | ORAL | 0 refills | Status: DC
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
## 2016-09-06 ENCOUNTER — Other Ambulatory Visit: Payer: Self-pay | Admitting: Internal Medicine

## 2016-09-06 ENCOUNTER — Encounter: Payer: Self-pay | Admitting: Internal Medicine

## 2016-09-06 DIAGNOSIS — I10 Essential (primary) hypertension: Secondary | ICD-10-CM

## 2016-09-06 DIAGNOSIS — I5042 Chronic combined systolic (congestive) and diastolic (congestive) heart failure: Secondary | ICD-10-CM

## 2016-09-07 MED FILL — hydrALAZINE HCL 10 MG TABS: 10 | 30 days supply | Qty: 90 | Fill #0

## 2016-09-07 MED FILL — ?LISINOPRIL 5 MG TABLET: 5 | 30 days supply | Qty: 30 | Fill #0

## 2016-09-07 MED FILL — SIMVASTATIN 20 MG TABLET: 20 | 30 days supply | Qty: 30 | Fill #0

## 2016-09-07 MED FILL — glipiZIDE 5 MG TABS: 5 | 30 days supply | Qty: 60 | Fill #0

## 2016-09-07 MED FILL — ?SPIRONOLACTONE 25 MG TABLE: 25 MG | 30 days supply | Qty: 30 | Fill #0

## 2016-09-07 MED FILL — ISOSORBIDE MN ER 30 MG TAB: 30 | 30 days supply | Qty: 15 | Fill #0

## 2016-09-07 MED FILL — ?FUROSEMIDE 40 MG TABLET: 40 | 30 days supply | Qty: 60 | Fill #0

## 2016-09-10 ENCOUNTER — Other Ambulatory Visit: Payer: Self-pay | Admitting: Internal Medicine

## 2016-09-10 DIAGNOSIS — I5042 Chronic combined systolic (congestive) and diastolic (congestive) heart failure: Secondary | ICD-10-CM

## 2016-09-10 DIAGNOSIS — I10 Essential (primary) hypertension: Secondary | ICD-10-CM

## 2016-09-21 ENCOUNTER — Ambulatory Visit: Payer: 59 | Admitting: Internal Medicine

## 2016-09-24 ENCOUNTER — Encounter: Payer: Self-pay | Admitting: Physician Assistant

## 2016-09-28 ENCOUNTER — Inpatient Hospital Stay: Payer: 59 | Admitting: Family Medicine

## 2016-10-26 ENCOUNTER — Emergency Department (HOSPITAL_COMMUNITY): Payer: 59

## 2016-10-26 ENCOUNTER — Emergency Department (HOSPITAL_COMMUNITY)
Admission: EM | Admit: 2016-10-26 | Discharge: 2016-10-26 | Disposition: A | Discharge status: Home / Self Care | Payer: 59 | Attending: Emergency Medicine | Admitting: Emergency Medicine

## 2016-10-26 ENCOUNTER — Encounter (HOSPITAL_COMMUNITY): Payer: Self-pay | Admitting: Nurse Practitioner

## 2016-10-26 DIAGNOSIS — S3992XA Unspecified injury of lower back, initial encounter: Secondary | ICD-10-CM | POA: Diagnosis not present

## 2016-10-26 DIAGNOSIS — I130 Hypertensive heart and chronic kidney disease with heart failure and stage 1 through stage 4 chronic kidney disease, or unspecified chronic kidney disease: Secondary | ICD-10-CM | POA: Diagnosis not present

## 2016-10-26 DIAGNOSIS — S199XXA Unspecified injury of neck, initial encounter: Secondary | ICD-10-CM | POA: Diagnosis present

## 2016-10-26 DIAGNOSIS — Y92410 Unspecified street and highway as the place of occurrence of the external cause: Secondary | ICD-10-CM | POA: Diagnosis not present

## 2016-10-26 DIAGNOSIS — Z87891 Personal history of nicotine dependence: Secondary | ICD-10-CM | POA: Insufficient documentation

## 2016-10-26 DIAGNOSIS — Y939 Activity, unspecified: Secondary | ICD-10-CM | POA: Diagnosis not present

## 2016-10-26 DIAGNOSIS — Z7984 Long term (current) use of oral hypoglycemic drugs: Secondary | ICD-10-CM | POA: Insufficient documentation

## 2016-10-26 DIAGNOSIS — N182 Chronic kidney disease, stage 2 (mild): Secondary | ICD-10-CM | POA: Insufficient documentation

## 2016-10-26 DIAGNOSIS — E1122 Type 2 diabetes mellitus with diabetic chronic kidney disease: Secondary | ICD-10-CM | POA: Insufficient documentation

## 2016-10-26 DIAGNOSIS — Z79899 Other long term (current) drug therapy: Secondary | ICD-10-CM | POA: Diagnosis not present

## 2016-10-26 DIAGNOSIS — I5042 Chronic combined systolic (congestive) and diastolic (congestive) heart failure: Secondary | ICD-10-CM | POA: Insufficient documentation

## 2016-10-26 DIAGNOSIS — Y999 Unspecified external cause status: Secondary | ICD-10-CM | POA: Diagnosis not present

## 2016-10-26 LAB — I-STAT CREATININE, ED: Creatinine, Ser: 1.2 mg/dL (ref 0.61–1.24)

## 2016-10-26 LAB — I-STAT TROPONIN, ED: Troponin i, poc: 0 ng/mL (ref 0.00–0.08)

## 2016-10-26 MED ORDER — HYDROCODONE-ACETAMINOPHEN 5-325 MG PO TABS: 1.0000 | ORAL_TABLET | Freq: Four times a day (QID) | ORAL | 0 refills | Status: DC | PRN

## 2016-10-26 MED ORDER — METHOCARBAMOL 500 MG PO TABS: 500.0000 mg | ORAL_TABLET | Freq: Two times a day (BID) | ORAL | 0 refills | Status: DC

## 2016-10-26 MED ORDER — IOPAMIDOL (ISOVUE-300) INJECTION 61%
INTRAVENOUS | Status: AC
  Administered 2016-10-26: 100 mL
  Filled 2016-10-26: qty 100
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
## 2016-11-21 ENCOUNTER — Other Ambulatory Visit: Payer: Self-pay | Admitting: Family Medicine

## 2016-11-21 DIAGNOSIS — I5042 Chronic combined systolic (congestive) and diastolic (congestive) heart failure: Secondary | ICD-10-CM

## 2016-11-21 DIAGNOSIS — I10 Essential (primary) hypertension: Secondary | ICD-10-CM

## 2016-11-21 MED FILL — FUROSEMIDE 40 MG TABLET: 40 | 30 days supply | Qty: 60 | Fill #0

## 2016-11-21 MED FILL — ISOSORBIDE MN ER 30 MG TAB: 30 | 30 days supply | Qty: 15 | Fill #1

## 2016-11-21 MED FILL — glipiZIDE 5 MG TABS: 5 | 30 days supply | Qty: 60 | Fill #1

## 2016-11-21 MED FILL — LISINOPRIL 5 MG TABLET: 5 | 30 days supply | Qty: 30 | Fill #0

## 2016-11-21 MED FILL — ?SIMVASTATIN 20 MG TABLET: 20 MG | 30 days supply | Qty: 30 | Fill #0

## 2016-11-21 MED FILL — SPIRONOLACTONE 25 MG TABLET: 25 | 30 days supply | Qty: 30 | Fill #0

## 2016-11-21 MED FILL — hydrALAZINE HCL 10 MG TABS: 10 | 30 days supply | Qty: 90 | Fill #0

## 2016-12-31 ENCOUNTER — Other Ambulatory Visit: Payer: Self-pay | Admitting: Family Medicine

## 2016-12-31 ENCOUNTER — Ambulatory Visit: Payer: 59 | Attending: Family Medicine | Admitting: Family Medicine

## 2016-12-31 ENCOUNTER — Encounter: Payer: Self-pay | Admitting: Family Medicine

## 2016-12-31 ENCOUNTER — Telehealth: Payer: Self-pay | Admitting: Family Medicine

## 2016-12-31 VITALS — BP 151/88 | HR 78 | Temp 98.2°F | Ht 70.0 in | Wt 260.8 lb

## 2016-12-31 DIAGNOSIS — I5042 Chronic combined systolic (congestive) and diastolic (congestive) heart failure: Secondary | ICD-10-CM | POA: Insufficient documentation

## 2016-12-31 DIAGNOSIS — I10 Essential (primary) hypertension: Secondary | ICD-10-CM

## 2016-12-31 DIAGNOSIS — F17200 Nicotine dependence, unspecified, uncomplicated: Secondary | ICD-10-CM | POA: Diagnosis not present

## 2016-12-31 DIAGNOSIS — I429 Cardiomyopathy, unspecified: Secondary | ICD-10-CM | POA: Diagnosis not present

## 2016-12-31 DIAGNOSIS — E1169 Type 2 diabetes mellitus with other specified complication: Secondary | ICD-10-CM | POA: Insufficient documentation

## 2016-12-31 DIAGNOSIS — I110 Hypertensive heart disease with heart failure: Secondary | ICD-10-CM | POA: Insufficient documentation

## 2016-12-31 DIAGNOSIS — G4733 Obstructive sleep apnea (adult) (pediatric): Secondary | ICD-10-CM | POA: Insufficient documentation

## 2016-12-31 LAB — GLUCOSE, POCT (MANUAL RESULT ENTRY): POC Glucose: 125 mg/dl — AB (ref 70–99)

## 2016-12-31 LAB — POCT GLYCOSYLATED HEMOGLOBIN (HGB A1C): Hemoglobin A1C: 7.1

## 2016-12-31 MED ORDER — HYDRALAZINE HCL 10 MG PO TABS: 10.0000 mg | ORAL_TABLET | Freq: Three times a day (TID) | ORAL | 5 refills | Status: DC

## 2016-12-31 MED ORDER — FUROSEMIDE 40 MG PO TABS: 40.0000 mg | ORAL_TABLET | Freq: Two times a day (BID) | ORAL | 5 refills | Status: DC

## 2016-12-31 MED ORDER — GLUCOSE BLOOD VI STRP: ORAL_STRIP | 12 refills | Status: DC

## 2016-12-31 MED ORDER — ISOSORBIDE MONONITRATE ER 30 MG PO TB24: 15.0000 mg | ORAL_TABLET | Freq: Every day | ORAL | 5 refills | Status: DC

## 2016-12-31 MED ORDER — SPIRONOLACTONE 25 MG PO TABS: 25.0000 mg | ORAL_TABLET | Freq: Every day | ORAL | 5 refills | Status: DC

## 2016-12-31 MED ORDER — CARVEDILOL 6.25 MG PO TABS: 6.2500 mg | ORAL_TABLET | Freq: Two times a day (BID) | ORAL | 5 refills | Status: DC

## 2016-12-31 MED ORDER — LISINOPRIL 5 MG PO TABS: 5.0000 mg | ORAL_TABLET | Freq: Every day | ORAL | 5 refills | Status: DC

## 2016-12-31 MED ORDER — ACCU-CHEK SOFTCLIX LANCET DEV MISC: 5 refills | Status: DC

## 2016-12-31 MED ORDER — ONETOUCH ULTRA SYSTEM W/DEVICE KIT: 1.0000 | PACK | Freq: Once | 0 refills | Status: AC

## 2016-12-31 MED ORDER — SIMVASTATIN 20 MG PO TABS: 20.0000 mg | ORAL_TABLET | Freq: Every day | ORAL | 5 refills | Status: DC

## 2016-12-31 MED ORDER — ACCU-CHEK AVIVA DEVI: 0 refills | Status: DC

## 2016-12-31 MED FILL — ONE TOUCH DELICA 33G LANCET: 25 days supply | Qty: 100 | Fill #0

## 2016-12-31 MED FILL — ONE TOUCH ULTRA TEST STRIPS: 25 days supply | Qty: 100 | Fill #0

## 2016-12-31 MED FILL — SPIRONOLACTONE 25 MG TABLET: 25 | 30 days supply | Qty: 30 | Fill #0

## 2016-12-31 MED FILL — hydrALAZINE HCL 10 MG TABS: 10 | 30 days supply | Qty: 90 | Fill #0

## 2016-12-31 MED FILL — FUROSEMIDE 40 MG TABLET: 40 | 30 days supply | Qty: 60 | Fill #0

## 2016-12-31 MED FILL — SIMVASTATIN 20 MG TABLET: 20 | 30 days supply | Qty: 30 | Fill #0

## 2016-12-31 MED FILL — ONE TOUCH ULTRAMINI METER: W/DEVICE | 1 days supply | Qty: 1 | Fill #0

## 2016-12-31 MED FILL — ISOSORBIDE MN ER 30 MG TAB: 30 | 30 days supply | Qty: 15 | Fill #0

## 2016-12-31 MED FILL — LISINOPRIL 5 MG TABLET: 5 | 30 days supply | Qty: 30 | Fill #0

## 2016-12-31 NOTE — Progress Notes (Signed)
Subjective:  Patient ID: Matthew [name], male    DOB: 09-Sep-1966  Age: 51 y.o. MRN: [id number]  CC: Diabetes; Hypertension; Cough (productive- yellow); Nasal Congestion; and Medication Refill ("all meds")   HPI Matthew [name] is a 51 year old male with a history of type 2 diabetes mellitus (A1c 7.1 from today), obstructive sleep apnea, chronic combined systolic and diastolic CHF (EF [id number] from 2-D echo 08/2015), hypertension who presents today for follow-up visit.  He has been compliant with his diabetic medications and the neuropathy he complained about at his last visit has resolved. He denies visual symptoms of hypoglycemia.  He has been out of his antihypertensive since elevated blood pressure. Denies shortness of breath, pedal edema and has a good exercise tolerance  Complaints of cough productive of yellowish sputum, nasal congestion but denies headache, sinus pressure or fevers. Has not used any OTC remedies  He has a form with him that needs completion for his CPAP supplies.  Past Medical History:  Diagnosis Date  . Acute combined systolic and diastolic CHF, NYHA class 3 ([place])    a. 02/2014 Echo: EF 25-30%.  . Cardiomyopathy ([place])    a. 02/2014 Echo: EF 25-30%, sev glob HK with inferolat HK->AK, mod conc LVH, Gr 2 DD, Mild MR, sev dil LA.  Marland Kitchen Hypertension   . Morbid obesity ([place])   . Tobacco abuse     Past Surgical History:  Procedure Laterality Date  . CATARACT EXTRACTION Right   . LEFT HEART CATHETERIZATION WITH CORONARY ANGIOGRAM N/A 03/04/2014   Procedure: LEFT HEART CATHETERIZATION WITH CORONARY ANGIOGRAM;  Surgeon: Burnell Blanks, MD;  Location[facility] C[facility] LAB;  Service: Cardiovascular;  Laterality: N/A;    No Known Allergies   Outpatient Medications Prior to Visit  Medication Sig Dispense Refill  . acetaminophen (TYLENOL) 500 MG tablet Take 500 mg by mouth every 6 (six) hours as needed for mild pain.    Marland Kitchen glipiZIDE (GLUCOTROL) 5 MG tablet Take 1 tablet (5 mg  total) by mouth 2 (two) times daily before a meal. 60 tablet 3  . furosemide (LASIX) 40 MG tablet TAKE 1 TABLET BY MOUTH 2 TIMES DAILY. 60 tablet 0  . hydrALAZINE (APRESOLINE) 10 MG tablet TAKE 1 TABLET BY MOUTH 3 TIMES DAILY. 90 tablet 0  . isosorbide mononitrate (IMDUR) 30 MG 24 hr tablet TAKE 1/2 TABLET BY MOUTH DAILY. 30 tablet 0  . spironolactone (ALDACTONE) 25 MG tablet TAKE 1 TABLET BY MOUTH DAILY. 30 tablet 0  . HYDROcodone-acetaminophen (NORCO/VICODIN) 5-325 MG tablet Take 1-2 tablets by mouth every 6 (six) hours as needed. (Patient not taking: Reported on 12/31/2016) 8 tablet 0  . methocarbamol (ROBAXIN) 500 MG tablet Take 1 tablet (500 mg total) by mouth 2 (two) times daily. (Patient not taking: Reported on 12/31/2016) 20 tablet 0  . naproxen (NAPROSYN) 500 MG tablet Take 1 tablet (500 mg total) by mouth 2 (two) times daily. (Patient not taking: Reported on 12/31/2016) 30 tablet 0  . Potassium Chloride ER 20 MEQ TBCR TAKE 1 TABLET BY MOUTH ONCE DAILY (Patient not taking: Reported on 12/31/2016) 30 tablet 1  . carvedilol (COREG) 6.25 MG tablet Take 1 tablet (6.25 mg total) by mouth 2 (two) times daily with a meal. (Patient not taking: Reported on 12/31/2016) 60 tablet 0  . lisinopril (PRINIVIL,ZESTRIL) 5 MG tablet TAKE 1 TABLET BY MOUTH DAILY. 30 tablet 0  . simvastatin (ZOCOR) 20 MG tablet TAKE 1 TABLET BY MOUTH AT BEDTIME. (Patient not taking: Reported on 12/31/2016) 30  tablet 0   No facility-administered medications prior to visit.     ROS Review of Systems  Constitutional: Negative for activity change and appetite change.  HENT:       See hpi  Eyes: Negative for visual disturbance.  Respiratory: Positive for cough. Negative for chest tightness and shortness of breath.   Cardiovascular: Negative for chest pain and leg swelling.  Gastrointestinal: Negative for abdominal distention, abdominal pain, constipation and diarrhea.  Endocrine: Negative.   Genitourinary: Negative for dysuria.    Musculoskeletal: Negative for joint swelling and myalgias.  Skin: Negative for rash.  Allergic/Immunologic: Negative.   Neurological: Negative for weakness, light-headedness and numbness.  Psychiatric/Behavioral: Negative for dysphoric mood and suicidal ideas.    Objective: Wt Readings from Last 3 Encounters:  12/31/16 260 lb 12.8 oz (118.3 kg)  09/05/16 250 lb (113.4 kg)  07/30/16 254 lb (115.2 kg)     BP (!) 151/88 (BP Location: Right Arm, Patient Position: Sitting, Cuff Size: Large)   Pulse 78   Temp 98.2 F (36.8 C) (Oral)   Ht 5\' 10"  (1.778 m)   Wt 260 lb 12.8 oz (118.3 kg)   SpO2 98%   BMI 37.42 kg/m   BP/Weight 12/31/2016 10/26/2016 [id number]  Systolic BP [id number]  Diastolic BP 88 68 84  Wt. (Lbs) 260.8 - 250  BMI 37.42 - 35.87      Physical Exam  Constitutional: He is oriented to person, place, and time. He appears well-developed and well-nourished.  Cardiovascular: Normal rate, normal heart sounds and intact distal pulses.   No murmur heard. Pulmonary/Chest: Effort normal and breath sounds normal. He has no wheezes. He has no rales. He exhibits no tenderness.  Abdominal: Soft. Bowel sounds are normal. He exhibits no distension and no mass. There is no tenderness.  Musculoskeletal: Normal range of motion.  Neurological: He is alert and oriented to person, place, and time.  Skin: Skin is warm and dry.  Psychiatric: He has a normal mood and affect.   CMP Latest Ref Rng & Units 10/26/2016 09/05/2016 08/25/2015  Glucose 65 - 99 mg/dL - 211(H) 169(H)  BUN 6 - 20 mg/dL - 10 12  Creatinine 0.61 - 1.24 mg/dL 1.20 1.19 1.27  Sodium 135 - 145 mmol/L - 135 140  Potassium 3.5 - 5.1 mmol/L - 3.7 4.3  Chloride 101 - 111 mmol/L - 103 101  CO2 22 - 32 mmol/L - 25 25  Calcium 8.9 - 10.3 mg/dL - 9.4 9.1  Total Protein 6.1 - 8.1 g/dL - - 6.7  Total Bilirubin 0.2 - 1.2 mg/dL - - 0.3  Alkaline Phos 40 - 115 U/L - - 58  AST 10 - 40 U/L - - 26  ALT 9 - 46 U/L - - 38     Lipid Panel     Component Value Date/Time   CHOL 161 08/25/2015 0905   TRIG 258 (H) 08/25/2015 0905   HDL 28 (L) 08/25/2015 0905   CHOLHDL 5.8 (H) 08/25/2015 0905   VLDL 52 (H) 08/25/2015 0905   LDLCALC 81 08/25/2015 0905     Lab Results  Component Value Date   HGBA1C 7.1 12/31/2016    Assessment & Plan:   1. Type 2 diabetes mellitus with other specified complication, without long-term current use of insulin (HCC) Controlled with A1c of 7.1 Continue medications - Glucose (CBG) - HgB A1c - Blood Glucose Monitoring Suppl (ACCU-CHEK AVIVA) device; Use as instructed daily.  Dispense: 1 each; Refill: 0 - glucose  blood (ACCU-CHEK AVIVA) test strip; Use as instructed daily  Dispense: 30 each; Refill: 12 - Lancet Devices (ACCU-CHEK SOFTCLIX) lancets; Use as instructed daily.  Dispense: 1 each; Refill: 5 - Lipid Panel w/reflex Direct LDL; Future - COMPLETE METABOLIC PANEL WITH GFR; Future - Microalbumin / creatinine urine ratio; Future  2. Chronic combined systolic and diastolic congestive heart failure (HCC) Ejection fraction of 45% from 2-D echo of 08/2015 Euvolemic; gained 6 pounds in the last 5 months Continue daily weights, restrictive daily fluids to 2 L per day - simvastatin (ZOCOR) 20 MG tablet; Take 1 tablet (20 mg total) by mouth at bedtime.  Dispense: 30 tablet; Refill: 5 - hydrALAZINE (APRESOLINE) 10 MG tablet; Take 1 tablet (10 mg total) by mouth 3 (three) times daily.  Dispense: 90 tablet; Refill: 5 - isosorbide mononitrate (IMDUR) 30 MG 24 hr tablet; Take 0.5 tablets (15 mg total) by mouth daily.  Dispense: 30 tablet; Refill: 5 - furosemide (LASIX) 40 MG tablet; Take 1 tablet (40 mg total) by mouth 2 (two) times daily.  Dispense: 60 tablet; Refill: 5 - spironolactone (ALDACTONE) 25 MG tablet; Take 1 tablet (25 mg total) by mouth daily.  Dispense: 30 tablet; Refill: 5  3. Essential hypertension Uncontrolled due to running out of medications which I have  refilled Low-sodium diet - lisinopril (PRINIVIL,ZESTRIL) 5 MG tablet; Take 1 tablet (5 mg total) by mouth daily.  Dispense: 30 tablet; Refill: 5 - carvedilol (COREG) 6.25 MG tablet; Take 1 tablet (6.25 mg total) by mouth 2 (two) times daily with a meal.  Dispense: 60 tablet; Refill: 5   4. Obstructive sleep apnea Currently on CPAP Form completed  Meds ordered this encounter  Medications  . simvastatin (ZOCOR) 20 MG tablet    Sig: Take 1 tablet (20 mg total) by mouth at bedtime.    Dispense:  30 tablet    Refill:  5  . lisinopril (PRINIVIL,ZESTRIL) 5 MG tablet    Sig: Take 1 tablet (5 mg total) by mouth daily.    Dispense:  30 tablet    Refill:  5  . hydrALAZINE (APRESOLINE) 10 MG tablet    Sig: Take 1 tablet (10 mg total) by mouth 3 (three) times daily.    Dispense:  90 tablet    Refill:  5  . isosorbide mononitrate (IMDUR) 30 MG 24 hr tablet    Sig: Take 0.5 tablets (15 mg total) by mouth daily.    Dispense:  30 tablet    Refill:  5  . carvedilol (COREG) 6.25 MG tablet    Sig: Take 1 tablet (6.25 mg total) by mouth 2 (two) times daily with a meal.    Dispense:  60 tablet    Refill:  5  . furosemide (LASIX) 40 MG tablet    Sig: Take 1 tablet (40 mg total) by mouth 2 (two) times daily.    Dispense:  60 tablet    Refill:  5  . Blood Glucose Monitoring Suppl (ACCU-CHEK AVIVA) device    Sig: Use as instructed daily.    Dispense:  1 each    Refill:  0  . glucose blood (ACCU-CHEK AVIVA) test strip    Sig: Use as instructed daily    Dispense:  30 each    Refill:  12  . Lancet Devices (ACCU-CHEK SOFTCLIX) lancets    Sig: Use as instructed daily.    Dispense:  1 each    Refill:  5  . spironolactone (ALDACTONE) 25 MG tablet  Sig: Take 1 tablet (25 mg total) by mouth daily.    Dispense:  30 tablet    Refill:  5    Follow-up: Return in about 3 months (around 03/31/2017) for Follow-up on diabetes mellitus.   Arnoldo Morale MD

## 2016-12-31 NOTE — Telephone Encounter (Signed)
Pt requesting Rx's to be faxed over to [facility] so he can enroll to receive his medications through the mail  Fax number: [phone] Phone number: [phone]

## 2016-12-31 NOTE — Patient Instructions (Signed)
Diabetes Mellitus and Food It is important for you to manage your blood sugar (glucose) level. Your blood glucose level can be greatly affected by what you eat. Eating healthier foods in the appropriate amounts throughout the day at about the same time each day will help you control your blood glucose level. It can also help slow or prevent worsening of your diabetes mellitus. Healthy eating may even help you improve the level of your blood pressure and reach or maintain a healthy weight. General recommendations for healthful eating and cooking habits include:  Eating meals and snacks regularly. Avoid going long periods of time without eating to lose weight.  Eating a diet that consists mainly of plant-based foods, such as fruits, vegetables, nuts, legumes, and whole grains.  Using low-heat cooking methods, such as baking, instead of high-heat cooking methods, such as deep frying.  Work with your dietitian to make sure you understand how to use the Nutrition Facts information on food labels. How can food affect me? Carbohydrates Carbohydrates affect your blood glucose level more than any other type of food. Your dietitian will help you determine how many carbohydrates to eat at each meal and teach you how to count carbohydrates. Counting carbohydrates is important to keep your blood glucose at a healthy level, especially if you are using insulin or taking certain medicines for diabetes mellitus. Alcohol Alcohol can cause sudden decreases in blood glucose (hypoglycemia), especially if you use insulin or take certain medicines for diabetes mellitus. Hypoglycemia can be a life-threatening condition. Symptoms of hypoglycemia (sleepiness, dizziness, and disorientation) are similar to symptoms of having too much alcohol. If your health care provider has given you approval to drink alcohol, do so in moderation and use the following guidelines:  Women should not have more than one drink per day, and men  should not have more than two drinks per day. One drink is equal to: ? 12 oz of beer. ? 5 oz of wine. ? 1 oz of hard liquor.  Do not drink on an empty stomach.  Keep yourself hydrated. Have water, diet soda, or unsweetened iced tea.  Regular soda, juice, and other mixers might contain a lot of carbohydrates and should be counted.  What foods are not recommended? As you make food choices, it is important to remember that all foods are not the same. Some foods have fewer nutrients per serving than other foods, even though they might have the same number of calories or carbohydrates. It is difficult to get your body what it needs when you eat foods with fewer nutrients. Examples of foods that you should avoid that are high in calories and carbohydrates but low in nutrients include:  Trans fats (most processed foods list trans fats on the Nutrition Facts label).  Regular soda.  Juice.  Candy.  Sweets, such as cake, pie, doughnuts, and cookies.  Fried foods.  What foods can I eat? Eat nutrient-rich foods, which will nourish your body and keep you healthy. The food you should eat also will depend on several factors, including:  The calories you need.  The medicines you take.  Your weight.  Your blood glucose level.  Your blood pressure level.  Your cholesterol level.  You should eat a variety of foods, including:  Protein. ? Lean cuts of meat. ? Proteins low in saturated fats, such as fish, egg whites, and beans. Avoid processed meats.  Fruits and vegetables. ? Fruits and vegetables that may help control blood glucose levels, such as apples,   mangoes, and yams.  Dairy products. ? Choose fat-free or low-fat dairy products, such as milk, yogurt, and cheese.  Grains, bread, pasta, and rice. ? Choose whole grain products, such as multigrain bread, whole oats, and brown rice. These foods may help control blood pressure.  Fats. ? Foods containing healthful fats, such as  nuts, avocado, olive oil, canola oil, and fish.  Does everyone with diabetes mellitus have the same meal plan? Because every person with diabetes mellitus is different, there is not one meal plan that works for everyone. It is very important that you meet with a dietitian who will help you create a meal plan that is just right for you. This information is not intended to replace advice given to you by your health care provider. Make sure you discuss any questions you have with your health care provider. Document Released: 08/30/2005 Document Revised: 05/10/2016 Document Reviewed: 10/30/2013 Elsevier Interactive Patient Education  2017 Elsevier Inc.  

## 2017-01-01 ENCOUNTER — Ambulatory Visit: Payer: 59 | Attending: Family Medicine

## 2017-01-01 DIAGNOSIS — E1169 Type 2 diabetes mellitus with other specified complication: Secondary | ICD-10-CM | POA: Insufficient documentation

## 2017-01-01 LAB — COMPLETE METABOLIC PANEL WITH GFR
ALT: 31 U/L (ref 9–46)
AST: 21 U/L (ref 10–40)
Albumin: 4 g/dL (ref 3.6–5.1)
Alkaline Phosphatase: 62 U/L (ref 40–115)
BUN: 13 mg/dL (ref 7–25)
CO2: 25 mmol/L (ref 20–31)
Calcium: 9 mg/dL (ref 8.6–10.3)
Chloride: 105 mmol/L (ref 98–110)
Creat: 1.26 mg/dL (ref 0.60–1.35)
GFR, Est African American: 77 mL/min (ref >=60)
GFR, Est Non African American: 67 mL/min (ref >=60)
Glucose, Bld: 142 mg/dL — ABNORMAL HIGH (ref 65–99)
Potassium: 4.3 mmol/L (ref 3.5–5.3)
Sodium: 137 mmol/L (ref 135–146)
Total Bilirubin: 0.3 mg/dL (ref 0.2–1.2)
Total Protein: 6.7 g/dL (ref 6.1–8.1)

## 2017-01-01 LAB — LIPID PANEL W/REFLEX DIRECT LDL
Cholesterol: 118 mg/dL (ref <200)
HDL: 33 mg/dL — ABNORMAL LOW (ref >40)
LDL-Cholesterol: 65 mg/dL
Non-HDL Cholesterol (Calc): 85 mg/dL (ref <130)
Total CHOL/HDL Ratio: 3.6 Ratio (ref <5.0)
Triglycerides: 113 mg/dL (ref <150)

## 2017-01-01 MED FILL — glipiZIDE 5 MG TABS: 5 | 30 days supply | Qty: 60 | Fill #2

## 2017-01-01 NOTE — Telephone Encounter (Signed)
Medication list faxed over to [facility].

## 2017-01-01 NOTE — Telephone Encounter (Signed)
Could you please printout his medications and fax them over? Thank you

## 2017-01-01 NOTE — Progress Notes (Signed)
Patient here for lab visit only 

## 2017-01-02 LAB — MICROALBUMIN / CREATININE URINE RATIO
Creatinine, Urine: 194 mg/dL (ref 20–370)
Microalb Creat Ratio: 283 mcg/mg creat — ABNORMAL HIGH (ref <30)
Microalb, Ur: 54.9 mg/dL

## 2017-01-11 ENCOUNTER — Telehealth: Payer: Self-pay

## 2017-01-11 NOTE — Telephone Encounter (Signed)
Writer called patient to discuss lab results and LVM requesting patient to return this call.

## 2017-01-11 NOTE — Telephone Encounter (Signed)
-----   Message from Arnoldo Morale, MD sent at 01/04/2017  1:14 PM EST ----- Lipids are normal, he does have slight microalbuminuria which could be early signs of diabetes affecting the kidney. Optimal glycemic control will go a long way to slowing progression.

## 2017-01-16 ENCOUNTER — Telehealth: Payer: Self-pay

## 2017-01-16 NOTE — Telephone Encounter (Signed)
-----   Message from Arnoldo Morale, MD sent at 01/04/2017  1:14 PM EST ----- Lipids are normal, he does have slight microalbuminuria which could be early signs of diabetes affecting the kidney. Optimal glycemic control will go a long way to slowing progression.

## 2017-01-16 NOTE — Telephone Encounter (Signed)
Writer called patient to discuss lab results.  LVM requesting patient to call back and discuss.

## 2017-01-22 NOTE — Progress Notes (Signed)
After many attempts to reach patient by phone to discuss lab results writer printed his results and along with a letter sent them to the patient.

## 2017-01-31 ENCOUNTER — Emergency Department (HOSPITAL_COMMUNITY): Payer: 59

## 2017-01-31 ENCOUNTER — Encounter (HOSPITAL_COMMUNITY): Payer: Self-pay | Admitting: Emergency Medicine

## 2017-01-31 ENCOUNTER — Inpatient Hospital Stay (HOSPITAL_COMMUNITY)
Admission: EM | Admit: 2017-01-31 | Discharge: 2017-02-04 | DRG: 065 | Disposition: A | Discharge status: Home / Self Care | Payer: 59 | Attending: Internal Medicine | Admitting: Internal Medicine

## 2017-01-31 DIAGNOSIS — I130 Hypertensive heart and chronic kidney disease with heart failure and stage 1 through stage 4 chronic kidney disease, or unspecified chronic kidney disease: Secondary | ICD-10-CM | POA: Diagnosis present

## 2017-01-31 DIAGNOSIS — Z7984 Long term (current) use of oral hypoglycemic drugs: Secondary | ICD-10-CM | POA: Diagnosis not present

## 2017-01-31 DIAGNOSIS — I10 Essential (primary) hypertension: Secondary | ICD-10-CM | POA: Diagnosis not present

## 2017-01-31 DIAGNOSIS — I5042 Chronic combined systolic (congestive) and diastolic (congestive) heart failure: Secondary | ICD-10-CM | POA: Diagnosis present

## 2017-01-31 DIAGNOSIS — E782 Mixed hyperlipidemia: Secondary | ICD-10-CM | POA: Diagnosis present

## 2017-01-31 DIAGNOSIS — E1122 Type 2 diabetes mellitus with diabetic chronic kidney disease: Secondary | ICD-10-CM | POA: Diagnosis present

## 2017-01-31 DIAGNOSIS — I6340 Cerebral infarction due to embolism of unspecified cerebral artery: Principal | ICD-10-CM | POA: Diagnosis present

## 2017-01-31 DIAGNOSIS — F17200 Nicotine dependence, unspecified, uncomplicated: Secondary | ICD-10-CM | POA: Diagnosis present

## 2017-01-31 DIAGNOSIS — I6300 Cerebral infarction due to thrombosis of unspecified precerebral artery: Secondary | ICD-10-CM | POA: Diagnosis not present

## 2017-01-31 DIAGNOSIS — N182 Chronic kidney disease, stage 2 (mild): Secondary | ICD-10-CM | POA: Diagnosis present

## 2017-01-31 DIAGNOSIS — R531 Weakness: Secondary | ICD-10-CM | POA: Diagnosis present

## 2017-01-31 DIAGNOSIS — G4733 Obstructive sleep apnea (adult) (pediatric): Secondary | ICD-10-CM | POA: Diagnosis present

## 2017-01-31 DIAGNOSIS — Z8673 Personal history of transient ischemic attack (TIA), and cerebral infarction without residual deficits: Secondary | ICD-10-CM | POA: Diagnosis present

## 2017-01-31 DIAGNOSIS — Z8249 Family history of ischemic heart disease and other diseases of the circulatory system: Secondary | ICD-10-CM | POA: Diagnosis not present

## 2017-01-31 DIAGNOSIS — I340 Nonrheumatic mitral (valve) insufficiency: Secondary | ICD-10-CM | POA: Diagnosis not present

## 2017-01-31 DIAGNOSIS — Z6836 Body mass index (BMI) 36.0-36.9, adult: Secondary | ICD-10-CM

## 2017-01-31 DIAGNOSIS — Z79899 Other long term (current) drug therapy: Secondary | ICD-10-CM

## 2017-01-31 DIAGNOSIS — I255 Ischemic cardiomyopathy: Secondary | ICD-10-CM | POA: Diagnosis present

## 2017-01-31 DIAGNOSIS — I509 Heart failure, unspecified: Secondary | ICD-10-CM | POA: Diagnosis not present

## 2017-01-31 DIAGNOSIS — I63423 Cerebral infarction due to embolism of bilateral anterior cerebral arteries: Secondary | ICD-10-CM | POA: Diagnosis not present

## 2017-01-31 DIAGNOSIS — I638 Other cerebral infarction: Secondary | ICD-10-CM | POA: Diagnosis not present

## 2017-01-31 DIAGNOSIS — E1149 Type 2 diabetes mellitus with other diabetic neurological complication: Secondary | ICD-10-CM

## 2017-01-31 DIAGNOSIS — E119 Type 2 diabetes mellitus without complications: Secondary | ICD-10-CM

## 2017-01-31 DIAGNOSIS — I69320 Aphasia following cerebral infarction: Secondary | ICD-10-CM | POA: Diagnosis not present

## 2017-01-31 DIAGNOSIS — I7100 Dissection of unspecified site of aorta: Secondary | ICD-10-CM

## 2017-01-31 DIAGNOSIS — I639 Cerebral infarction, unspecified: Secondary | ICD-10-CM | POA: Diagnosis not present

## 2017-01-31 DIAGNOSIS — I6349 Cerebral infarction due to embolism of other cerebral artery: Secondary | ICD-10-CM | POA: Diagnosis not present

## 2017-01-31 LAB — COMPREHENSIVE METABOLIC PANEL
ALT: 22 U/L (ref 17–63)
AST: 21 U/L (ref 15–41)
Albumin: 4.1 g/dL (ref 3.5–5.0)
Alkaline Phosphatase: 61 U/L (ref 38–126)
Anion gap: 11 (ref 5–15)
BUN: 11 mg/dL (ref 6–20)
CO2: 23 mmol/L (ref 22–32)
Calcium: 9.5 mg/dL (ref 8.9–10.3)
Chloride: 105 mmol/L (ref 101–111)
Creatinine, Ser: 1.39 mg/dL — ABNORMAL HIGH (ref 0.61–1.24)
GFR calc Af Amer: >60 mL/min (ref >60)
GFR calc non Af Amer: 58 mL/min — ABNORMAL LOW (ref >60)
Glucose, Bld: 143 mg/dL — ABNORMAL HIGH (ref 65–99)
Potassium: 4 mmol/L (ref 3.5–5.1)
Sodium: 139 mmol/L (ref 135–145)
Total Bilirubin: 0.9 mg/dL (ref 0.3–1.2)
Total Protein: 7.1 g/dL (ref 6.5–8.1)

## 2017-01-31 LAB — BRAIN NATRIURETIC PEPTIDE: B Natriuretic Peptide: 15.8 pg/mL (ref 0.0–100.0)

## 2017-01-31 LAB — I-STAT TROPONIN, ED: Troponin i, poc: 0 ng/mL (ref 0.00–0.08)

## 2017-01-31 LAB — GLUCOSE, CAPILLARY: Glucose-Capillary: 134 mg/dL — ABNORMAL HIGH (ref 65–99)

## 2017-01-31 LAB — CBC
HCT: 46.4 % (ref 39.0–52.0)
Hemoglobin: 15.8 g/dL (ref 13.0–17.0)
MCH: 29.7 pg (ref 26.0–34.0)
MCHC: 34.1 g/dL (ref 30.0–36.0)
MCV: 87.2 fL (ref 78.0–100.0)
Platelets: 249 10*3/uL (ref 150–400)
RBC: 5.32 MIL/uL (ref 4.22–5.81)
RDW: 13.7 % (ref 11.5–15.5)
WBC: 5 10*3/uL (ref 4.0–10.5)

## 2017-01-31 LAB — DIFFERENTIAL
Basophils Absolute: 0 10*3/uL (ref 0.0–0.1)
Basophils Relative: 1 %
Eosinophils Absolute: 0.1 10*3/uL (ref 0.0–0.7)
Eosinophils Relative: 1 %
Lymphocytes Relative: 38 %
Lymphs Abs: 1.9 10*3/uL (ref 0.7–4.0)
Monocytes Absolute: 0.5 10*3/uL (ref 0.1–1.0)
Monocytes Relative: 9 %
Neutro Abs: 2.6 10*3/uL (ref 1.7–7.7)
Neutrophils Relative %: 51 %

## 2017-01-31 LAB — I-STAT CHEM 8, ED
BUN: 13 mg/dL (ref 6–20)
Calcium, Ion: 1.15 mmol/L (ref 1.15–1.40)
Chloride: 104 mmol/L (ref 101–111)
Creatinine, Ser: 1.4 mg/dL — ABNORMAL HIGH (ref 0.61–1.24)
Glucose, Bld: 146 mg/dL — ABNORMAL HIGH (ref 65–99)
HCT: 48 % (ref 39.0–52.0)
Hemoglobin: 16.3 g/dL (ref 13.0–17.0)
Potassium: 3.9 mmol/L (ref 3.5–5.1)
Sodium: 140 mmol/L (ref 135–145)
TCO2: 25 mmol/L (ref 0–100)

## 2017-01-31 LAB — PROTIME-INR
INR: 0.95
Prothrombin Time: 12.7 seconds (ref 11.4–15.2)

## 2017-01-31 LAB — APTT: aPTT: 36 seconds (ref 24–36)

## 2017-01-31 MED ORDER — SIMVASTATIN 20 MG PO TABS
20.0000 mg | ORAL_TABLET | Freq: Every day | ORAL | Status: DC
  Administered 2017-01-31 – 2017-02-03 (×4): 20 mg via ORAL
  Filled 2017-01-31 (×4): qty 1

## 2017-01-31 MED ORDER — STROKE: EARLY STAGES OF RECOVERY BOOK
Freq: Once | Status: AC
  Administered 2017-01-31: 22:00:00

## 2017-01-31 MED ORDER — ACETAMINOPHEN 325 MG PO TABS: 650.0000 mg | ORAL_TABLET | ORAL | Status: DC | PRN

## 2017-01-31 MED ORDER — INSULIN ASPART 100 UNIT/ML ~~LOC~~ SOLN: 0.0000 [IU] | Freq: Every day | SUBCUTANEOUS | Status: DC

## 2017-01-31 MED ORDER — ACETAMINOPHEN 650 MG RE SUPP: 650.0000 mg | RECTAL | Status: DC | PRN

## 2017-01-31 MED ORDER — ACETAMINOPHEN 160 MG/5ML PO SOLN: 650.0000 mg | ORAL | Status: DC | PRN

## 2017-01-31 MED ORDER — SODIUM CHLORIDE 0.9 % IV SOLN
INTRAVENOUS | Status: DC
  Administered 2017-01-31: 22:00:00 via INTRAVENOUS

## 2017-01-31 MED ORDER — INSULIN ASPART 100 UNIT/ML ~~LOC~~ SOLN
0.0000 [IU] | Freq: Three times a day (TID) | SUBCUTANEOUS | Status: DC
  Administered 2017-02-01: 2 [IU] via SUBCUTANEOUS
  Administered 2017-02-01 – 2017-02-02 (×2): 1 [IU] via SUBCUTANEOUS
  Administered 2017-02-02: 2 [IU] via SUBCUTANEOUS
  Administered 2017-02-03 (×2): 1 [IU] via SUBCUTANEOUS

## 2017-01-31 MED ORDER — ACETAMINOPHEN 500 MG PO TABS: 500.0000 mg | ORAL_TABLET | Freq: Four times a day (QID) | ORAL | Status: DC | PRN

## 2017-01-31 MED ORDER — ENOXAPARIN SODIUM 30 MG/0.3ML ~~LOC~~ SOLN
30.0000 mg | SUBCUTANEOUS | Status: DC
  Administered 2017-01-31: 30 mg via SUBCUTANEOUS
  Filled 2017-01-31: qty 0.3

## 2017-01-31 MED ORDER — ASPIRIN 325 MG PO TABS
325.0000 mg | ORAL_TABLET | Freq: Every day | ORAL | Status: DC
Start: 2017-01-31 — End: 2017-02-04
  Administered 2017-01-31 – 2017-02-03 (×4): 325 mg via ORAL
  Filled 2017-01-31 (×4): qty 1

## 2017-01-31 MED ORDER — CARVEDILOL 6.25 MG PO TABS
6.2500 mg | ORAL_TABLET | Freq: Two times a day (BID) | ORAL | Status: DC
  Administered 2017-02-01 – 2017-02-03 (×6): 6.25 mg via ORAL
  Filled 2017-01-31 (×3): qty 1
  Filled 2017-01-31: qty 2
  Filled 2017-01-31 (×2): qty 1

## 2017-01-31 MED ORDER — ASPIRIN 300 MG RE SUPP: 300.0000 mg | Freq: Every day | RECTAL | Status: DC

## 2017-01-31 NOTE — ED Provider Notes (Signed)
[facility] DEPT Provider Note   CSN: [id number] Arrival date & time: 01/31/17  0905     History   Chief Complaint Chief Complaint  Patient presents with  . Aphasia  . Chest Pain    HPI Esai Thaut is a 51 y.o. male.  Patient presents for evaluation of a sensation of arm and leg weakness, numbness and slurred speech, since yesterday. He feels like his symptoms are coming and going somewhat. He was able to drive his vehicle here for evaluation. No prior similar problem in the past. He is taking his medications as prescribed. He denies fever, chills, nausea, vomiting, cough, shortness of breath or chest pain. No other known modifying factors.    HPI  Past Medical History:  Diagnosis Date  . Acute combined systolic and diastolic CHF, NYHA class 3 ([place])    a. 02/2014 Echo: EF 25-30%.  . Cardiomyopathy ([place])    a. 02/2014 Echo: EF 25-30%, sev glob HK with inferolat HK->AK, mod conc LVH, Gr 2 DD, Mild MR, sev dil LA.  Marland Kitchen Hypertension   . Morbid obesity ([place])   . Tobacco abuse     Patient Active Problem List   Diagnosis Date Noted  . Erectile dysfunction 07/30/2016  . OSA (obstructive sleep apnea) 05/10/2015  . CKD (chronic kidney disease) stage 2, GFR 60-89 ml/min 03/10/2014  . Diabetes mellitus type II, controlled ([place]) 03/10/2014  . Chronic combined systolic and diastolic congestive heart failure ([place]) 03/03/2014  . Morbid obesity ([place])   . Hypertension 03/02/2014    Past Surgical History:  Procedure Laterality Date  . CATARACT EXTRACTION Right   . LEFT HEART CATHETERIZATION WITH CORONARY ANGIOGRAM N/A 03/04/2014   Procedure: LEFT HEART CATHETERIZATION WITH CORONARY ANGIOGRAM;  Surgeon: Burnell Blanks, MD;  Location: [facility] CATH LAB;  Service: Cardiovascular;  Laterality: N/A;       Home Medications    Prior to Admission medications   Medication Sig Start Date End Date Taking? Authorizing Provider  carvedilol (COREG) 6.25 MG tablet Take 1 tablet (6.25 mg  total) by mouth 2 (two) times daily with a meal. 12/31/16  Yes Arnoldo Morale, MD  furosemide (LASIX) 40 MG tablet Take 1 tablet (40 mg total) by mouth 2 (two) times daily. 12/31/16  Yes Arnoldo Morale, MD  glipiZIDE (GLUCOTROL) 5 MG tablet Take 1 tablet (5 mg total) by mouth 2 (two) times daily before a meal. 07/30/16  Yes Arnoldo Morale, MD  hydrALAZINE (APRESOLINE) 10 MG tablet Take 1 tablet (10 mg total) by mouth 3 (three) times daily. 12/31/16  Yes Arnoldo Morale, MD  isosorbide mononitrate (IMDUR) 30 MG 24 hr tablet Take 0.5 tablets (15 mg total) by mouth daily. 12/31/16  Yes Arnoldo Morale, MD  lisinopril (PRINIVIL,ZESTRIL) 5 MG tablet Take 1 tablet (5 mg total) by mouth daily. 12/31/16  Yes Arnoldo Morale, MD  simvastatin (ZOCOR) 20 MG tablet Take 1 tablet (20 mg total) by mouth at bedtime. 12/31/16  Yes Arnoldo Morale, MD  spironolactone (ALDACTONE) 25 MG tablet Take 1 tablet (25 mg total) by mouth daily. 12/31/16  Yes Arnoldo Morale, MD  acetaminophen (TYLENOL) 500 MG tablet Take 500 mg by mouth every 6 (six) hours as needed for mild pain.    Historical Provider, MD  glucose blood (ONE TOUCH ULTRA TEST) test strip Use as instructed 12/31/16   Arnoldo Morale, MD  HYDROcodone-acetaminophen (NORCO/VICODIN) 5-325 MG tablet Take 1-2 tablets by mouth every 6 (six) hours as needed. Patient not taking: Reported on 12/31/2016 10/26/16  Alexandra M Law, PA-C  methocarbamol (ROBAXIN) 500 MG tablet Take 1 tablet (500 mg total) by mouth 2 (two) times daily. Patient not taking: Reported on 12/31/2016 10/26/16   Bea Graff Law, PA-C  naproxen (NAPROSYN) 500 MG tablet Take 1 tablet (500 mg total) by mouth 2 (two) times daily. Patient not taking: Reported on 12/31/2016 09/05/16   Hyman Bible, PA-C  Potassium Chloride ER 20 MEQ TBCR TAKE 1 TABLET BY MOUTH ONCE DAILY Patient not taking: Reported on 12/31/2016 11/02/14   Renella Cunas, MD    Family History Family History  Problem Relation Age of Onset  . Heart attack Father      died @ 88  . Cervical cancer Mother     died @ 83    Social History Social History  Substance Use Topics  . Smoking status: Former Smoker    Packs/day: 0.25    Years: 20.00    Types: Cigarettes    Quit date: 03/02/2014  . Smokeless tobacco: Never Used  . Alcohol use No     Allergies   Patient has no known allergies.   Review of Systems Review of Systems  All other systems reviewed and are negative.    Physical Exam Updated Vital Signs BP 126/95   Pulse 63   Temp 98.1 F (36.7 C)   Resp 16   Ht 5\' 10"  (1.778 m)   Wt 250 lb (113.4 kg)   SpO2 97%   BMI 35.87 kg/m   Physical Exam  Constitutional: He is oriented to person, place, and time. He appears well-developed and well-nourished.  HENT:  Head: Normocephalic and atraumatic.  Right Ear: External ear normal.  Left Ear: External ear normal.  Eyes: Conjunctivae and EOM are normal. Pupils are equal, round, and reactive to light.  Neck: Normal range of motion and phonation normal. Neck supple.  Cardiovascular: Normal rate, regular rhythm and normal heart sounds.   Pulmonary/Chest: Effort normal and breath sounds normal. He exhibits no bony tenderness.  Abdominal: Soft. There is no tenderness.  Musculoskeletal: Normal range of motion.  Neurological: He is alert and oriented to person, place, and time. No cranial nerve deficit or sensory deficit. He exhibits normal muscle tone. Coordination normal.  Mild dysarthria. No aphasia or nystagmus.  Skin: Skin is warm, dry and intact.  Psychiatric: He has a normal mood and affect. His behavior is normal. Judgment and thought content normal.  Nursing note and vitals reviewed.    ED Treatments / Results  Labs (all labs ordered are listed, but only abnormal results are displayed) Labs Reviewed  COMPREHENSIVE METABOLIC PANEL - Abnormal; Notable for the following:       Result Value   Glucose, Bld 143 (*)    Creatinine, Ser 1.39 (*)    GFR calc non Af Amer 58 (*)    All  other components within normal limits  I-STAT CHEM 8, ED - Abnormal; Notable for the following:    Creatinine, Ser 1.40 (*)    Glucose, Bld 146 (*)    All other components within normal limits  CSF CULTURE  GRAM STAIN  PROTIME-INR  APTT  CBC  DIFFERENTIAL  BRAIN NATRIURETIC PEPTIDE  CSF CELL COUNT WITH DIFFERENTIAL  CSF CELL COUNT WITH DIFFERENTIAL  GLUCOSE, CSF  PROTEIN, CSF  I-STAT TROPOININ, ED    EKG  EKG Interpretation  Date/Time:  Thursday January 31 2017 09:20:54 EST Ventricular Rate:  81 PR Interval:  162 QRS Duration: 92 QT Interval:  394 QTC  Calculation: 457 R Axis:   40 Text Interpretation:  Sinus rhythm with Premature atrial complexes with Abberant conduction Nonspecific T wave abnormality Abnormal ECG since last tracing no significant change Confirmed by Eulis Foster  MD, Vickki Igou [id number]) on 01/31/2017 3:13:42 PM       Radiology Dg Chest 2 View  Result Date: 01/31/2017 CLINICAL DATA:  Chest pain . EXAM: CHEST  2 VIEW COMPARISON:  12/27/2015 . FINDINGS: Mediastinum hilar structures normal. Cardiomegaly with normal pulmonary vascularity. No focal infiltrate. No pleural effusion or pneumothorax. No acute bony abnormality . IMPRESSION: 1. Mild cardiomegaly, no pulmonary venous could. 2. No acute pulmonary disease. Electronically Signed   By: Marcello Moores  Register   On: 01/31/2017 10:11   Ct Head Wo Contrast  Result Date: 01/31/2017 CLINICAL DATA:  Slurred speech. Left grip weakness. Symptoms began yesterday. EXAM: CT HEAD WITHOUT CONTRAST TECHNIQUE: Contiguous axial images were obtained from the base of the skull through the vertex without intravenous contrast. COMPARISON:  None. FINDINGS: Brain: Brainstem and cerebellum are normal. No abnormality seen in the right cerebral hemisphere. On the left, there is abnormal low density in the corpus callosum and pericallosal white matter which could represent ACA territory infarction. No evidence of hemorrhage. No hydrocephalus or  extra-axial collection. Vascular: No abnormal vascular finding.  No hyperdense vessels. Skull: Normal Sinuses/Orbits: Clear except for a small amount of mucus in the sphenoid sinus, probably not significant. Other: None significant IMPRESSION: Abnormal low-density in the left corpus callosum and pericallosal deep white matter that could represent recent left ACA infarction. Electronically Signed   By: Nelson Chimes M.D.   On: 01/31/2017 10:31     Mr Angiogram Head Wo Contrast  Result Date: 01/31/2017 CLINICAL DATA:  Weakness and tingling in both legs. Tingling in the hands. Dysarthria. EXAM: MRI HEAD WITHOUT CONTRAST MRA HEAD WITHOUT CONTRAST TECHNIQUE: Multiplanar, multiecho pulse sequences of the brain and surrounding structures were obtained without intravenous contrast. Angiographic images of the head were obtained using MRA technique without contrast. COMPARISON:  Head CT 01/31/2017 FINDINGS: MRI HEAD FINDINGS Some sequences are motion degraded, particularly the sagittal T1 sequence. Brain: There is an acute infarct involving the genu and anterior body of the corpus callosum on the left with diffuse bilateral involvement of the mid and posterior body. Small foci of acute infarction are present in the adjacent left cingulate gyrus and in the left frontal white matter predominantly at the level of the centrum semiovale. There are punctate acute infarcts in both parietal lobes. There is no evidence of intracranial hemorrhage, mass, midline shift, or extra-axial fluid collection. The ventricles and sulci are normal in size. Small foci of T2 hyperintensity in the cerebral white matter separate from the areas of acute infarction, most notable in the right parietal lobe, are nonspecific but compatible with minimal chronic small vessel ischemic disease. Vascular: Major intracranial vascular flow voids are preserved. Skull and upper cervical spine: No focal marrow lesion identified. Sinuses/Orbits: Prior right  cataract extraction. Small volume secretions in the sphenoid sinuses. Other: None. MRA HEAD FINDINGS The study is mildly motion degraded. The visualized distal vertebral arteries are patent with the right being dominant. PICA is appear patent, although the left PICA origin was not imaged. SCA origins are patent. Basilar artery is widely patent. There is a small left posterior communicating artery. PCAs are patent without evidence of significant proximal stenosis. The internal carotid arteries are widely patent from skullbase to carotid termini. The MCAs are patent without evidence of major branch occlusion or  significant proximal stenosis. The A1 and proximal A2 segments are patent bilaterally without evidence of significant stenosis. There is mid left A2 occlusion. There is also other occlusion or severely diminished flow in the more distal right A2 segment, with evaluation limited by motion artifact and the vessels small size. No intracranial aneurysm is identified. IMPRESSION: 1. 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 2. Additional small left ACA/watershed infarcts. 3. Punctate acute right parietal infarct. 4. 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 5. Diminished flow versus occlusion of the distal right A2 segment. Electronically Signed   By: Logan Bores M.D.   On: 01/31/2017 15:12   Mr Brain Wo Contrast  Result Date: 01/31/2017 CLINICAL DATA:  Weakness and tingling in both legs. Tingling in the hands. Dysarthria. EXAM: MRI HEAD WITHOUT CONTRAST MRA HEAD WITHOUT CONTRAST TECHNIQUE: Multiplanar, multiecho pulse sequences of the brain and surrounding structures were obtained without intravenous contrast. Angiographic images of the head were obtained using MRA technique without contrast. COMPARISON:  Head CT 01/31/2017 FINDINGS: MRI HEAD FINDINGS Some sequences are motion degraded, particularly the sagittal T1 sequence. Brain: There is an acute infarct  involving the genu and anterior body of the corpus callosum on the left with diffuse bilateral involvement of the mid and posterior body. Small foci of acute infarction are present in the adjacent left cingulate gyrus and in the left frontal white matter predominantly at the level of the centrum semiovale. There are punctate acute infarcts in both parietal lobes. There is no evidence of intracranial hemorrhage, mass, midline shift, or extra-axial fluid collection. The ventricles and sulci are normal in size. Small foci of T2 hyperintensity in the cerebral white matter separate from the areas of acute infarction, most notable in the right parietal lobe, are nonspecific but compatible with minimal chronic small vessel ischemic disease. Vascular: Major intracranial vascular flow voids are preserved. Skull and upper cervical spine: No focal marrow lesion identified. Sinuses/Orbits: Prior right cataract extraction. Small volume secretions in the sphenoid sinuses. Other: None. MRA HEAD FINDINGS The study is mildly motion degraded. The visualized distal vertebral arteries are patent with the right being dominant. PICA is appear patent, although the left PICA origin was not imaged. SCA origins are patent. Basilar artery is widely patent. There is a small left posterior communicating artery. PCAs are patent without evidence of significant proximal stenosis. The internal carotid arteries are widely patent from skullbase to carotid termini. The MCAs are patent without evidence of major branch occlusion or significant proximal stenosis. The A1 and proximal A2 segments are patent bilaterally without evidence of significant stenosis. There is mid left A2 occlusion. There is also other occlusion or severely diminished flow in the more distal right A2 segment, with evaluation limited by motion artifact and the vessels small size. No intracranial aneurysm is identified. IMPRESSION: 1. Acute infarction involving a large portion of  the corpus callosum, left greater than right. 2. Additional small left ACA/watershed infarcts. 3. Punctate acute right parietal infarct. 4. 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 5. Diminished flow versus occlusion of the distal right A2 segment. Electronically Signed   By: Logan Bores M.D.   On: 01/31/2017 15:12    Procedures .Lumbar Puncture Date/Time: 01/31/2017 4:03 PM Performed by: Daleen Bo Authorized by: Daleen Bo   Consent:    Consent obtained:  Written   Consent given by:  Patient   Risks discussed:  Bleeding and infection   Alternatives discussed:  Observation Pre-procedure details:    Procedure purpose:  Diagnostic   Preparation: Patient was prepped and draped in usual sterile fashion   Anesthesia (see MAR for exact dosages):    Anesthesia method:  Local infiltration   Local anesthetic:  Lidocaine 1% w/o epi Procedure details:    Lumbar space:  L3-L4 interspace   Patient position:  Sitting   Needle gauge:  22   Needle type:  Spinal needle - Quincke tip   Needle length (in):  3.5   Number of attempts:  1 Post-procedure:    Puncture site:  Adhesive bandage applied   Patient tolerance of procedure:  Procedure terminated at patient's request Comments:     Patient developed "nerve pain" when the needle was inserted about 3 inches, and the procedure had to be terminated without obtaining spinal fluid    (including critical care time)  Medications Ordered in ED Medications - No data to display   Initial Impression / Assessment and Plan / ED Course  I have reviewed the triage vital signs and the nursing notes.  Pertinent labs & imaging results that were available during my care of the patient were reviewed by me and considered in my medical decision making (see chart for details).     Medications - No data to display  Patient Vitals for the past 24 hrs:  BP Temp Temp src Pulse Resp SpO2 Height Weight  01/31/17 1245 126/95 - - 63 - 97 % - -  01/31/17  1130 145/82 - - (!) 56 16 97 % - -  01/31/17 1115 134/74 - - 70 20 96 % - -  01/31/17 1106 - 98.1 F (36.7 C) - - - - - -  01/31/17 1100 121/84 - - 75 22 96 % - -  01/31/17 0930 148/85 - - 81 23 97 % - -  01/31/17 0912 (!) 122/106 97.6 F (36.4 C) Oral 95 17 95 % 5\' 10"  (1.778 m) 250 lb (113.4 kg)    12:58- consult neurology-neurology saw patient and requested LP.  LP delayed because the patient was at MRI.  Attempt was made after return from MRI.   4:04 PM Reevaluation with update and discussion. After initial assessment and treatment, an updated evaluation reveals patient states he still has numbness in his hands.  His wife is here now and states that his speech is at his baseline.  Patient instructed that he needed to be admitted but stated he wanted to go home and return by appointment.  Cesiah Westley L   16: 10-case again discussed with neuro hospitalist, who agreed that no additional attempts need to be made for spinal tap since the MRI has now returned showing bilateral acute infarcts, watershed infarcts, and multiple infarcts.  The patient will require hospitalization for further evaluation urgently.  16:40- Patient now agrees to be admitted to the hospital.  Final Clinical Impressions(s) / ED Diagnoses   Final diagnoses:  Cerebrovascular accident (CVA), unspecified mechanism ([place])    Acute CVA, patient not candidate for TPA because of duration of symptoms greater than 24 hours.  Initial concern for possible Guillain-Barr, resolved after return of MRI.  Patient was reluctant to be admitted to the hospital.  He was given full informed consent regarding his acute condition and the significant chance of worsening if he were to leave the hospital.   Nursing Notes Reviewed/ Care Coordinated Applicable Imaging Reviewed Interpretation of Laboratory Data incorporated into ED treatment   Plan: Admit     New  Prescriptions New Prescriptions   No medications on file       Daleen Bo, MD 02/01/17 [phone]

## 2017-01-31 NOTE — ED Notes (Signed)
Unsuccessful attempt at giving report to 42M.  Nurses still in report.

## 2017-01-31 NOTE — ED Notes (Signed)
Pt negative for extremity drift, left grip weaker than right.

## 2017-01-31 NOTE — ED Notes (Signed)
Dinner tray ordered for patient.

## 2017-01-31 NOTE — Progress Notes (Signed)
Patient admitted from ER. Patient alert and oriented x 4. Patient oriented to room and made comfortable. Tele placed and was verified.  

## 2017-01-31 NOTE — Consult Note (Signed)
Requesting Physician: Dr. Eulis Foster    Chief Complaint: bilateral leg weakness and tingling in both legs and hand along with right facial decreased movement.   History obtained from:  Patient     HPI:                                                                                                                                         Matthew [name] is an 52 y.o. male who noted on Tuesday that he had decreased sensation in bilateral legs and weakness in bilateral legs. This progressed to bilateral leg weakness and tingling in both hands. . Today he noted dysarthria and came to ED to be evaluated. Currently he states his symptoms have not progressed today or resolved.   Date last known well: Date: 11/28/2017 Time last known well: Unable to determine tPA Given: No: out of window   Past Medical History:  Diagnosis Date  . Acute combined systolic and diastolic CHF, NYHA class 3 ([place])    a. 02/2014 Echo: EF 25-30%.  . Cardiomyopathy ([place])    a. 02/2014 Echo: EF 25-30%, sev glob HK with inferolat HK->AK, mod conc LVH, Gr 2 DD, Mild MR, sev dil LA.  Marland Kitchen Hypertension   . Morbid obesity ([place])   . Tobacco abuse     Past Surgical History:  Procedure Laterality Date  . CATARACT EXTRACTION Right   . LEFT HEART CATHETERIZATION WITH CORONARY ANGIOGRAM N/A 03/04/2014   Procedure: LEFT HEART CATHETERIZATION WITH CORONARY ANGIOGRAM;  Surgeon: Burnell Blanks, MD;  Location: [facility] CATH LAB;  Service: Cardiovascular;  Laterality: N/A;    Family History  Problem Relation Age of Onset  . Heart attack Father     died @ 41  . Cervical cancer Mother     died @ 41   Social History:  reports that he quit smoking about 2 years ago. His smoking use included Cigarettes. He has a 5.00 pack-year smoking history. He has never used smokeless tobacco. He reports that he does not drink alcohol or use drugs.  Allergies: No Known Allergies  Medications:                                                                                                                            No current facility-administered medications for this encounter.    Current Outpatient Prescriptions  Medication Sig  Dispense Refill  . carvedilol (COREG) 6.25 MG tablet Take 1 tablet (6.25 mg total) by mouth 2 (two) times daily with a meal. 60 tablet 5  . furosemide (LASIX) 40 MG tablet Take 1 tablet (40 mg total) by mouth 2 (two) times daily. 60 tablet 5  . glipiZIDE (GLUCOTROL) 5 MG tablet Take 1 tablet (5 mg total) by mouth 2 (two) times daily before a meal. 60 tablet 3  . hydrALAZINE (APRESOLINE) 10 MG tablet Take 1 tablet (10 mg total) by mouth 3 (three) times daily. 90 tablet 5  . isosorbide mononitrate (IMDUR) 30 MG 24 hr tablet Take 0.5 tablets (15 mg total) by mouth daily. 30 tablet 5  . lisinopril (PRINIVIL,ZESTRIL) 5 MG tablet Take 1 tablet (5 mg total) by mouth daily. 30 tablet 5  . simvastatin (ZOCOR) 20 MG tablet Take 1 tablet (20 mg total) by mouth at bedtime. 30 tablet 5  . spironolactone (ALDACTONE) 25 MG tablet Take 1 tablet (25 mg total) by mouth daily. 30 tablet 5  . acetaminophen (TYLENOL) 500 MG tablet Take 500 mg by mouth every 6 (six) hours as needed for mild pain.    Marland Kitchen glucose blood (ONE TOUCH ULTRA TEST) test strip Use as instructed 30 each 12  . HYDROcodone-acetaminophen (NORCO/VICODIN) 5-325 MG tablet Take 1-2 tablets by mouth every 6 (six) hours as needed. (Patient not taking: Reported on 12/31/2016) 8 tablet 0  . methocarbamol (ROBAXIN) 500 MG tablet Take 1 tablet (500 mg total) by mouth 2 (two) times daily. (Patient not taking: Reported on 12/31/2016) 20 tablet 0  . naproxen (NAPROSYN) 500 MG tablet Take 1 tablet (500 mg total) by mouth 2 (two) times daily. (Patient not taking: Reported on 12/31/2016) 30 tablet 0  . Potassium Chloride ER 20 MEQ TBCR TAKE 1 TABLET BY MOUTH ONCE DAILY (Patient not taking: Reported on 12/31/2016) 30 tablet 1     ROS:                                                                                                                                        History obtained from the patient  General ROS: negative for - chills, fatigue, fever, night sweats, weight gain or weight loss Psychological ROS: negative for - behavioral disorder, hallucinations, memory difficulties, mood swings or suicidal ideation Ophthalmic ROS: negative for - blurry vision, double vision, eye pain or loss of vision ENT ROS: negative for - epistaxis, nasal discharge, oral lesions, sore throat, tinnitus or vertigo Allergy and Immunology ROS: negative for - hives or itchy/watery eyes Hematological and Lymphatic ROS: negative for - bleeding problems, bruising or swollen lymph nodes Endocrine ROS: negative for - galactorrhea, hair pattern changes, polydipsia/polyuria or temperature intolerance Respiratory ROS: negative for - cough, hemoptysis, shortness of breath or wheezing Cardiovascular ROS: negative for - chest pain, dyspnea on exertion, edema or irregular heartbeat Gastrointestinal ROS: negative for - abdominal pain, diarrhea,  hematemesis, nausea/vomiting or stool incontinence Genito-Urinary ROS: negative for - dysuria, hematuria, incontinence or urinary frequency/urgency Musculoskeletal ROS: negative for - joint swelling or muscular weakness Neurological ROS: as noted in HPI Dermatological ROS: negative for rash and skin lesion changes  Neurologic Examination:                                                                                                      Blood pressure 126/95, pulse 63, temperature 98.1 F (36.7 C), resp. rate 16, height 5\' 10"  (1.778 m), weight 113.4 kg (250 lb), SpO2 97 %.  HEENT-  Normocephalic, no lesions, without obvious abnormality.  Normal external eye and conjunctiva.  Normal TM's bilaterally.  Normal auditory canals and external ears. Normal external nose, mucus membranes and septum.  Normal pharynx. Cardiovascular- S1, S2 normal, pulses palpable throughout   Lungs-  chest clear, no wheezing, rales, normal symmetric air entry Abdomen- soft, non-tender; bowel sounds normal; no masses,  no organomegaly Extremities- no edema Lymph-no adenopathy palpable Musculoskeletal-no joint tenderness, deformity or swelling Skin-warm and dry, no hyperpigmentation, vitiligo, or suspicious lesions  Neurological Examination Mental Status: Alert, oriented, thought content appropriate.  Speech slightly dysarthric without evidence of aphasia.  Able to follow 3 step commands without difficulty. Cranial Nerves: II: Visual fields grossly normal,  III,IV, VI: ptosis not present, extra-ocular motions intact bilaterally, pupils equal, round, reactive to light and accommodation V,VII: smile symmetric, facial light touch sensation normal bilaterally VIII: hearing normal bilaterally IX,X: uvula rises symmetrically XI: bilateral shoulder shrug XII: midline tongue extension Motor: Right : Upper extremity   5/5    Left:     Upper extremity   5/5  Lower extremity   4/5     Lower extremity   4/5 Tone and bulk:normal tone throughout; no atrophy noted Sensory: cold sensation decreased up to just above knee Deep Tendon Reflexes: 1+ and symmetric throughout UE and minimal to no reflexes in KJ and AJ Plantars: Right: downgoing   Left: downgoing Cerebellar: normal finger-to-nose and normal heel-to-shin test Gait: not tested       Lab Results: Basic Metabolic Panel:  Recent Labs Lab 01/31/17 0918 01/31/17 0935  NA 139 140  K 4.0 3.9  CL 105 104  CO2 23  --   GLUCOSE 143* 146*  BUN 11 13  CREATININE 1.39* 1.40*  CALCIUM 9.5  --     Liver Function Tests:  Recent Labs Lab 01/31/17 0918  AST 21  ALT 22  ALKPHOS 61  BILITOT 0.9  PROT 7.1  ALBUMIN 4.1   No results for input(s): LIPASE, AMYLASE in the last 168 hours. No results for input(s): AMMONIA in the last 168 hours.  CBC:  Recent Labs Lab 01/31/17 0918 01/31/17 0935  WBC 5.0  --   NEUTROABS 2.6  --    HGB 15.8 16.3  HCT 46.4 48.0  MCV 87.2  --   PLT 249  --     Cardiac Enzymes: No results for input(s): CKTOTAL, CKMB, CKMBINDEX, TROPONINI in the last 168 hours.  Lipid Panel: No results for input(s): CHOL,  TRIG, HDL, CHOLHDL, VLDL, LDLCALC in the last 168 hours.  CBG: No results for input(s): GLUCAP in the last 168 hours.  Microbiology: Results for orders placed or performed during the hospital encounter of 03/02/14  Blood culture (routine x 2)     Status: None   Collection Time: 03/02/14 11:30 AM  Result Value Ref Range Status   Specimen Description BLOOD RIGHT ARM  Final   Special Requests BOTTLES DRAWN AEROBIC AND ANAEROBIC 10CC  Final   Culture  Setup Time   Final    03/02/2014 16:05 Performed at [facility]   Culture   Final    NO GROWTH 5 DAYS Performed at [facility]   Report Status 03/08/2014 FINAL  Final  Blood culture (routine x 2)     Status: None   Collection Time: 03/02/14 11:39 AM  Result Value Ref Range Status   Specimen Description BLOOD RIGHT HAND  Final   Special Requests BOTTLES DRAWN AEROBIC ONLY [facility]  Final   Culture  Setup Time   Final    03/02/2014 16:05 Performed at [facility]   Culture   Final    NO GROWTH 5 DAYS Performed at [facility]   Report Status 03/08/2014 FINAL  Final    Coagulation Studies:  Recent Labs  01/31/17 0918  LABPROT 12.7  INR 0.95    Imaging: Dg Chest 2 View  Result Date: 01/31/2017 CLINICAL DATA:  Chest pain . EXAM: CHEST  2 VIEW COMPARISON:  12/27/2015 . FINDINGS: Mediastinum hilar structures normal. Cardiomegaly with normal pulmonary vascularity. No focal infiltrate. No pleural effusion or pneumothorax. No acute bony abnormality . IMPRESSION: 1. Mild cardiomegaly, no pulmonary venous could. 2. No acute pulmonary disease. Electronically Signed   By: Marcello Moores  Register   On: 01/31/2017 10:11   Ct Head Wo Contrast  Result Date: 01/31/2017 CLINICAL DATA:  Slurred speech. Left  grip weakness. Symptoms began yesterday. EXAM: CT HEAD WITHOUT CONTRAST TECHNIQUE: Contiguous axial images were obtained from the base of the skull through the vertex without intravenous contrast. COMPARISON:  None. FINDINGS: Brain: Brainstem and cerebellum are normal. No abnormality seen in the right cerebral hemisphere. On the left, there is abnormal low density in the corpus callosum and pericallosal white matter which could represent ACA territory infarction. No evidence of hemorrhage. No hydrocephalus or extra-axial collection. Vascular: No abnormal vascular finding.  No hyperdense vessels. Skull: Normal Sinuses/Orbits: Clear except for a small amount of mucus in the sphenoid sinus, probably not significant. Other: None significant IMPRESSION: Abnormal low-density in the left corpus callosum and pericallosal deep white matter that could represent recent left ACA infarction. Electronically Signed   By: Nelson Chimes M.D.   On: 01/31/2017 10:31       Assessment and plan discussed with with attending physician and they are in agreement.    Etta Quill PA-C [facility] [phone]  01/31/2017, 1:33 PM   Assessment: 51 y.o. male with gradual onset of bilateral leg weakness and bilateral leg and hand numbness. Today starting to have dysarthria. questionable stroke on CT. At this time given right facial droop cannot rule out stroke.  Concern for possible AIDP given the bilateral ascending numbness and weakness. Discussed with ED MD to do LP while in ED to look for elevated protein. MRI pending. Will follow  Stroke Risk Factors - hypertension and smoking  Dr. Lawana Pai to to addend note.

## 2017-01-31 NOTE — ED Triage Notes (Signed)
Pt from home with c/o slurred speech and generalized weakness starting yesterday around noon.  Pt additionally states he has left sided sharp cp radiating to RLQ since around noon yesterday.  Pt reports family hx of early MI and CVA.  Hx of CHF.  NAD, A&O.

## 2017-01-31 NOTE — ED Notes (Signed)
Pt in MRI.

## 2017-01-31 NOTE — ED Notes (Signed)
MRI called. Patient states he is not claustrophobic.

## 2017-01-31 NOTE — ED Notes (Signed)
Two unsuccessful attempts at IV access. 

## 2017-01-31 NOTE — ED Notes (Signed)
ED Provider at bedside. 

## 2017-01-31 NOTE — H&P (Signed)
History and Physical        Hospital Admission Note Date: 01/31/2017  Patient name: Matthew [name] Medical record number: [id number] Date of birth: 05-Feb-1966 Age: 51 y.o. Gender: male  PCP: Arnoldo Morale, MD   Patient coming from: home    Chief Complaint:  Bilateral lower extremity weakness, hands, speech difficulty for last 2 days.  HPI: Patient is a 51 year old male with history of combined systolic and diastolic CHF, EF [id number], hypertension, obesity, diabetes mellitus, hyperlipidemia presented to ED with decreased sensation and weakness in his bilateral lower legs since Tuesday for last 2 days. He also noticed tingling in his both hands for the last 2 days as well. 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 He also noticed tingling in his hands at the same time, which subsequently resolved right lower quadrant chest pain. He continued working. His symptoms did not improve. Today he noticed dysarthria and came to the ER to be evaluated. Otherwise he denied any difficulty swallowing, blurry vision, headache.  ED work-up/course:  Temp 97.82F and respiratory rate 23, pulse 81, BP 148/85 O2 sats 97% on room air CT head showed abnormal low density in the left corpus callosum and pericallosal deep white matter that could percent recent left ACA infarction MRI of the brain showed acute infarction involving large portion of the corpus callosum left greater than right with additional small left ACA/watershed infarcts, punctate acute right parietal infarct.  Review of Systems: Positives marked in 'bold' Constitutional: Denies fever, chills, diaphoresis, poor appetite and fatigue.  HEENT: Denies photophobia, eye pain, redness, hearing loss, ear  pain, congestion, sore throat, rhinorrhea, sneezing, mouth sores, trouble swallowing, neck pain, neck stiffness and tinnitus.   Respiratory: Denies SOB, DOE, cough, + transient chest tightness,  and wheezing.   Cardiovascular: Denies chest pain, palpitations and leg swelling.  Gastrointestinal: Denies nausea, vomiting, abdominal pain, diarrhea, constipation, blood in stool and abdominal distention.  Genitourinary: Denies dysuria, urgency, frequency, hematuria, flank pain and difficulty urinating.  Musculoskeletal: Denies myalgias, back pain, joint swelling, arthralgias and gait problem.  Skin: Denies pallor, rash and wound.  Neurological:  please see history of present illness Hematological: Denies adenopathy. Easy bruising, personal or family bleeding history  Psychiatric/Behavioral: Denies suicidal ideation, mood changes, confusion, nervousness, sleep disturbance and agitation  Past Medical History: Past Medical History:  Diagnosis Date  . Acute combined systolic and diastolic CHF, NYHA class 3 ([place])    a. 02/2014 Echo: EF 25-30%.  . Cardiomyopathy ([place])    a. 02/2014 Echo: EF 25-30%, sev glob HK with inferolat HK->AK, mod conc LVH, Gr 2 DD, Mild MR, sev dil LA.  Marland Kitchen Hypertension   . Morbid obesity ([place])   . Tobacco abuse     Past Surgical History:  Procedure Laterality Date  . CATARACT EXTRACTION Right   . LEFT HEART CATHETERIZATION WITH CORONARY ANGIOGRAM N/A 03/04/2014   Procedure: LEFT HEART CATHETERIZATION WITH CORONARY ANGIOGRAM;  Surgeon: Burnell Blanks, MD;  Location: [facility] CATH LAB;  Service: Cardiovascular;  Laterality: N/A;    Medications:  Prior to Admission medications   Medication Sig Start Date End Date Taking? Authorizing Provider  carvedilol (COREG) 6.25 MG tablet Take 1 tablet (6.25 mg total) by mouth 2 (two) times daily with a meal. 12/31/16  Yes Arnoldo Morale, MD  furosemide (LASIX) 40 MG tablet Take 1 tablet (40 mg total) by mouth 2 (two) times daily. 12/31/16   Yes Arnoldo Morale, MD  glipiZIDE (GLUCOTROL) 5 MG tablet Take 1 tablet (5 mg total) by mouth 2 (two) times daily before a meal. 07/30/16  Yes Arnoldo Morale, MD  hydrALAZINE (APRESOLINE) 10 MG tablet Take 1 tablet (10 mg total) by mouth 3 (three) times daily. 12/31/16  Yes Arnoldo Morale, MD  isosorbide mononitrate (IMDUR) 30 MG 24 hr tablet Take 0.5 tablets (15 mg total) by mouth daily. 12/31/16  Yes Arnoldo Morale, MD  lisinopril (PRINIVIL,ZESTRIL) 5 MG tablet Take 1 tablet (5 mg total) by mouth daily. 12/31/16  Yes Arnoldo Morale, MD  simvastatin (ZOCOR) 20 MG tablet Take 1 tablet (20 mg total) by mouth at bedtime. 12/31/16  Yes Arnoldo Morale, MD  spironolactone (ALDACTONE) 25 MG tablet Take 1 tablet (25 mg total) by mouth daily. 12/31/16  Yes Arnoldo Morale, MD  acetaminophen (TYLENOL) 500 MG tablet Take 500 mg by mouth every 6 (six) hours as needed for mild pain.    Historical Provider, MD  glucose blood (ONE TOUCH ULTRA TEST) test strip Use as instructed 12/31/16   Arnoldo Morale, MD  HYDROcodone-acetaminophen (NORCO/VICODIN) 5-325 MG tablet Take 1-2 tablets by mouth every 6 (six) hours as needed. Patient not taking: Reported on 12/31/2016 10/26/16   Frederica Kuster, PA-C  methocarbamol (ROBAXIN) 500 MG tablet Take 1 tablet (500 mg total) by mouth 2 (two) times daily. Patient not taking: Reported on 12/31/2016 10/26/16   Bea Graff Law, PA-C  naproxen (NAPROSYN) 500 MG tablet Take 1 tablet (500 mg total) by mouth 2 (two) times daily. Patient not taking: Reported on 12/31/2016 09/05/16   Hyman Bible, PA-C  Potassium Chloride ER 20 MEQ TBCR TAKE 1 TABLET BY MOUTH ONCE DAILY Patient not taking: Reported on 12/31/2016 11/02/14   Renella Cunas, MD    Allergies:  No Known Allergies  Social History:  reports that he quit smoking about 2 years ago. His smoking use included Cigarettes. He has a 5.00 pack-year smoking history. He has never used smokeless tobacco. He reports that he does not drink alcohol or use  drugs.  Family History: Family History  Problem Relation Age of Onset  . Heart attack Father     died @ 21  . Cervical cancer Mother     died @ 12    Physical Exam: Blood pressure 137/80, pulse 68, temperature 98.1 F (36.7 C), resp. rate 20, height 5\' 10"  (1.778 m), weight 113.4 kg (250 lb), SpO2 99 %. General: Alert, awake, oriented x3, in no acute distress, Slight dysarthria noted. HEENT: normocephalic, atraumatic, anicteric sclera, pink conjunctiva, pupils equal and reactive to light and accomodation, oropharynx clear Neck: supple, no masses or lymphadenopathy, no goiter, no bruits  Heart: Regular rate and rhythm, without murmurs, rubs or gallops. Lungs: Clear to auscultation bilaterally, no wheezing, rales or rhonchi. Abdomen: Soft, nontender, nondistended, positive bowel sounds, no masses. Extremities: No clubbing, cyanosis or edema with positive pedal pulses. Neuro: Grossly intact, no focal neurological deficits, LUE, LLE strength 5/5, RUE, RLE 4/5 Psych: alert and oriented x 3, normal mood and affect Skin: no rashes or lesions, warm and dry   LABS on  Admission:  Basic Metabolic Panel:  Recent Labs Lab 01/31/17 0918 01/31/17 0935  NA 139 140  K 4.0 3.9  CL 105 104  CO2 23  --   GLUCOSE 143* 146*  BUN 11 13  CREATININE 1.39* 1.40*  CALCIUM 9.5  --    Liver Function Tests:  Recent Labs Lab 01/31/17 0918  AST 21  ALT 22  ALKPHOS 61  BILITOT 0.9  PROT 7.1  ALBUMIN 4.1   No results for input(s): LIPASE, AMYLASE in the last 168 hours. No results for input(s): AMMONIA in the last 168 hours. CBC:  Recent Labs Lab 01/31/17 0918 01/31/17 0935  WBC 5.0  --   NEUTROABS 2.6  --   HGB 15.8 16.3  HCT 46.4 48.0  MCV 87.2  --   PLT 249  --    Cardiac Enzymes: No results for input(s): CKTOTAL, CKMB, CKMBINDEX, TROPONINI in the last 168 hours. BNP: Invalid input(s): POCBNP CBG: No results for input(s): GLUCAP in the last 168 hours.  Radiological Exams  on Admission:  No results found.  *I have personally reviewed the images above*  EKG: Independently reviewed.Rate 81, normal sinus rhythm with PACs   Assessment/Plan Principal Problem: Acute CVA (cerebral vascular accident) [facility]): Presenting with bilateral lower extremity weakness, decreased sensation in lower extremities and hands, dysarthria -  MRI of the brain showed acute infarction involving large portion of the corpus callosum left greater than right with additional small left ACA/watershed infarcts, punctate acute right parietal infarct. - Neurology consulted, will obtain full stroke workup including 2-D echo, carotid Dopplers - Obtain lipid panel, hemoglobin A1c - Aspirin 325 mg daily, not on any antiplatelet agent PTA - PT/OT, ST eval - Patient on multiple cardiac medications, will hold furosemide, hydralazine, lisinopril, Aldactone, imdur to avoid precipitating hypotension. For now, will continue only Coreg (with parameters).  Active Problems: Transient right-sided chest tightness - Troponin 1 negative, BNP 15.8 - No further chest pain episodes, obtain 2-D echocardiogram for further workup    Hypertension - Currently BP stable, hold off on furosemide, hydralazine, lisinopril, Aldactone, imdur - Continue only Coreg, will gradually add antihypertensives as BP allows    Chronic combined systolic and diastolic congestive heart failure ([place]), history of ischemic cardiomyopathy - Currently euvolemic, BNP 15.8 - Baseline creatinine 1.2, currently 1.4, hold off on Lasix, Aldactone, lisinopril  - will avoid IV fluids, monitor volume status closely, may need to restart diuretics    obesity (HCC) - Patient counseled on diet and weight control  Mild acute on  CKD (chronic kidney disease) stage 2, GFR 60-89 ml/min - Baseline creatinine 1.2, currently 1.4 on admission - hold off on Lasix, Aldactone, lisinopril     Diabetes mellitus type II, controlled (HCC) - Obtain hemoglobin  A1c, placed on sliding scale insulin, hold oral hypoglycemics  DVT prophylaxis: Lovenox  CODE STATUS: Full CODE STATUS, discussed with the patient  Consults called: Neurology  Family Communication: Admission, patients condition and plan of care including tests being ordered have been discussed with the patient  who indicates understanding and agree with the plan and Code Status  Admission status: Inpatient, telemetry  Disposition plan: Further plan will depend as patient's clinical course evolves and further radiologic and laboratory data become available. At the time of admission, it appears that the appropriate admission status for this patient is INPATIENT . This is judged to be reasonable and necessary in order to provide the required intensity of service to ensure the patient's safety given the presenting  symptoms, physical exam findings, and initial radiographic and laboratory data in the context of their chronic comorbidities.     Time Spent on Admission: 60 minutes  Avya Flavell M.D. [facility] 01/31/2017, 6:35 PM Pager: [phone]  If 7PM-7AM, please contact night-coverage [email or web address] Password [email or web address]

## 2017-01-31 NOTE — ED Notes (Signed)
Unsuccessful LP by EDP.

## 2017-01-31 NOTE — ED Notes (Signed)
Hooked patient up to the monitor placed patient into a gown

## 2017-01-31 NOTE — ED Notes (Signed)
Per EDP, patient able to eat.

## 2017-01-31 NOTE — ED Notes (Signed)
Consent for LP procedure completed.  EDP at bedside. LP tray set up.

## 2017-02-01 ENCOUNTER — Inpatient Hospital Stay (HOSPITAL_COMMUNITY): Payer: 59

## 2017-02-01 DIAGNOSIS — I63423 Cerebral infarction due to embolism of bilateral anterior cerebral arteries: Secondary | ICD-10-CM

## 2017-02-01 DIAGNOSIS — E782 Mixed hyperlipidemia: Secondary | ICD-10-CM

## 2017-02-01 DIAGNOSIS — I509 Heart failure, unspecified: Secondary | ICD-10-CM

## 2017-02-01 LAB — GLUCOSE, CAPILLARY
Glucose-Capillary: 128 mg/dL — ABNORMAL HIGH (ref 65–99)
Glucose-Capillary: 154 mg/dL — ABNORMAL HIGH (ref 65–99)
Glucose-Capillary: 82 mg/dL (ref 65–99)
Glucose-Capillary: 181 mg/dL — ABNORMAL HIGH (ref 65–99)

## 2017-02-01 LAB — LIPID PANEL
Cholesterol: 147 mg/dL (ref 0–200)
HDL: 28 mg/dL — ABNORMAL LOW (ref >40)
LDL Cholesterol: UNDETERMINED mg/dL (ref 0–99)
Total CHOL/HDL Ratio: 5.3 RATIO
Triglycerides: 415 mg/dL — ABNORMAL HIGH (ref <150)
VLDL: UNDETERMINED mg/dL (ref 0–40)

## 2017-02-01 LAB — ECHOCARDIOGRAM COMPLETE
Height: 68 in
Weight: 3846.4 oz

## 2017-02-01 MED ORDER — FUROSEMIDE 40 MG PO TABS
40.0000 mg | ORAL_TABLET | Freq: Every day | ORAL | Status: DC
Start: 2017-02-01 — End: 2017-02-04
  Administered 2017-02-01 – 2017-02-03 (×3): 40 mg via ORAL
  Filled 2017-02-01 (×3): qty 1

## 2017-02-01 MED ORDER — EZETIMIBE 10 MG PO TABS
10.0000 mg | ORAL_TABLET | Freq: Every day | ORAL | Status: DC
  Administered 2017-02-01 – 2017-02-03 (×3): 10 mg via ORAL
  Filled 2017-02-01 (×3): qty 1

## 2017-02-01 MED ORDER — ENOXAPARIN SODIUM 40 MG/0.4ML ~~LOC~~ SOLN
40.0000 mg | SUBCUTANEOUS | Status: DC
  Administered 2017-02-01 – 2017-02-03 (×3): 40 mg via SUBCUTANEOUS
  Filled 2017-02-01 (×3): qty 0.4

## 2017-02-01 NOTE — Evaluation (Signed)
Speech Language Pathology Evaluation Patient Details Name: Matthew [name] MRN: [id number] DOB: [birth year] Today's Date: 02/01/2017 Time: [id number] SLP Time Calculation (min) (ACUTE ONLY): 18 min  Problem List:  Patient Active Problem List   Diagnosis Date Noted  . CVA (cerebral vascular accident) ([place]) 01/31/2017  . Erectile dysfunction 07/30/2016  . OSA (obstructive sleep apnea) 05/10/2015  . CKD (chronic kidney disease) stage 2, GFR 60-89 ml/min 03/10/2014  . Diabetes mellitus type II, controlled ([place]) 03/10/2014  . Chronic combined systolic and diastolic congestive heart failure ([place]) 03/03/2014  . Morbid obesity ([place])   . Hypertension 03/02/2014   Past Medical History:  Past Medical History:  Diagnosis Date  . Acute combined systolic and diastolic CHF, NYHA class 3 ([place])    a. 02/2014 Echo: EF 25-30%.  . Cardiomyopathy ([place])    a. 02/2014 Echo: EF 25-30%, sev glob HK with inferolat HK->AK, mod conc LVH, Gr 2 DD, Mild MR, sev dil LA.  Marland Kitchen Hypertension   . Morbid obesity ([place])   . Tobacco abuse    Past Surgical History:  Past Surgical History:  Procedure Laterality Date  . CATARACT EXTRACTION Right   . LEFT HEART CATHETERIZATION WITH CORONARY ANGIOGRAM N/A 03/04/2014   Procedure: LEFT HEART CATHETERIZATION WITH CORONARY ANGIOGRAM;  Surgeon: Burnell Blanks, MD;  Location: [facility] CATH LAB;  Service: Cardiovascular;  Laterality: N/A;   HPI:  Pt is a 51 y/o male who presents with BLE weakness, numbness in hands, and speech difficulty. MRI revealed acute infarct in the corpus callosum, additional small L ACA/watershed infarcts, and punctate acute E parietal infarct.   Assessment / Plan / Recommendation Clinical Impression  Pt scored a 20/30 on the MoCA (<26 indicative of impairment). He appeared to be most impacted by reduced selective attention, storage/retrieval of new information, and complex problem solving. His affect was flat and at times, questioned his effort level when  tasks became more challenging for him. He did seem to stumble over his words, but his speech was intelligible at the conversational level. Recommend OP SLP f/u at discharge.    SLP Assessment  Patient needs continued Speech Lanaguage Pathology Services    Follow Up Recommendations  Outpatient SLP;Other (comment) (close, intermittent supervision)    Frequency and Duration min 2x/week  2 weeks      SLP Evaluation Cognition  Overall Cognitive Status: Impaired/Different from baseline Arousal/Alertness: Awake/alert Orientation Level: Oriented X4 Attention: Selective Selective Attention: Impaired Selective Attention Impairment: Verbal basic Memory: Impaired Memory Impairment: Storage deficit;Retrieval deficit;Decreased recall of new information Awareness: Impaired Awareness Impairment: Anticipatory impairment Problem Solving: Impaired Problem Solving Impairment: Verbal complex Safety/Judgment: Appears intact       Comprehension  Auditory Comprehension Overall Auditory Comprehension: Appears within functional limits for tasks assessed    Expression Expression Primary Mode of Expression: Verbal Verbal Expression Overall Verbal Expression: Appears within functional limits for tasks assessed Written Expression Dominant Hand: Left   Oral / Motor  Motor Speech Overall Motor Speech: Impaired Respiration: Within functional limits Phonation: Normal Resonance: Within functional limits Articulation: Impaired Level of Impairment: Conversation Intelligibility: Intelligible   GO                    Matthew [name] 02/01/2017, 4:48 PM  Matthew [name], M.A. CCC-SLP [phone]

## 2017-02-01 NOTE — Progress Notes (Addendum)
PROGRESS NOTE    Matthew [name]  [id number] DOB: 1966/11/30 DOA: 01/31/2017 PCP: Arnoldo Morale, MD   Brief Narrative: Patient is a 51 year old male with history of combined systolic and diastolic CHF, EF [id number], hypertension, obesity, diabetes mellitus, hyperlipidemia presented to ED with decreased sensation and weakness in his bilateral lower legs since Tuesday for last 2 days. He also noticed tingling in his both hands for the last 2 days as well. 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 He also noticed tingling in his hands at the same time, which subsequently resolved right lower quadrant chest pain. He continued working. His symptoms did not improve. Today he noticed dysarthria and came to the ER to be evaluated. Otherwise he denied any difficulty swallowing, blurry vision, headache.    Assessment & Plan:   Principal Problem:   CVA (cerebral vascular accident) [facility]) Active Problems:   Hypertension   Chronic combined systolic and diastolic congestive heart failure (HCC)   Morbid obesity (HCC)   CKD (chronic kidney disease) stage 2, GFR 60-89 ml/min   Diabetes mellitus type II, controlled ([place])   OSA (obstructive sleep apnea)  1-Acute stroke,  -Presenting with bilateral lower extremity weakness, decreased sensation in lower extremities and hands, dysarthria MRI; Acute infarction involving a large portion of the corpus callosum, left greater than right. Additional small left ACA/watershed infarcts. Punctate acute right parietal infarct. 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 LDL unable to be calculated, triglycerides 415. Started statins.  Permissive HTN. Continue with coreg and resume lasix. will continue to hold; hydralazine, lisinopril, Aldactone, imdur.  Follow ECHO , doppler.  Neuro recommending TEE>   Right-sided chest  tightness Troponin 1 negative, BNP 15.8 ECHO pending.  -chest pain free.   Chronic combined systolic and diastolic congestive heart failure ([place]), history of ischemic cardiomyopathy Resume lasix, continue with coreg.   Mild acute on  CKD (chronic kidney disease) stage 2, GFR 60-89 ml/min Monitor on lasix,   Diabetes mellitus type II, controlled ([place]); Hb-A1c pending.  Hold Glipizide.  SSI.   DVT prophylaxis: Lovenox.  Code Status: Full code.  Family Communication: Care discussed with patient.  Disposition Plan: await work up for stroke.   Consultants:   Neurology    Procedures:   ECHO  Doppler;    Antimicrobials: none   Subjective: He is feeling ok, speech has improved slightly. Still with numbness in his hands.    Objective: Vitals:   02/01/17 0500 02/01/17 0700 02/01/17 0900 02/01/17 1317  BP: (!) 169/106 (!) 177/106 (!) 164/95 (!) 159/95  Pulse: 79 61 72 67  Resp:   16 20  Temp: 98.2 F (36.8 C)  98.4 F (36.9 C) 98.1 F (36.7 C)  TempSrc: Oral  Oral Oral  SpO2: 100% 98% 98% 100%  Weight:      Height:        Intake/Output Summary (Last 24 hours) at 02/01/17 1352 Last data filed at 02/01/17 1315  Gross per 24 hour  Intake             1720 ml  Output              600 ml  Net             1120 ml   Filed Weights   01/31/17 0912 01/31/17 2050  Weight: 113.4 kg (250 lb) 109  kg (240 lb 6.4 oz)    Examination:  General exam: Appears calm and comfortable  Respiratory system: Clear to auscultation. Respiratory effort normal. Cardiovascular system: S1 & S2 heard, RRR. No JVD, murmurs, rubs, gallops or clicks. No pedal edema. Gastrointestinal system: Abdomen is nondistended, soft and nontender. No organomegaly or masses felt. Normal bowel sounds heard. Central nervous system: Alert and oriented, dysarthric speech. strength lower extremities 4/5 Skin: No rashes, lesions or ulcers Psychiatry: Judgement and insight appear normal. Mood & affect  appropriate.     Data Reviewed: I have personally reviewed following labs and imaging studies  CBC:  Recent Labs Lab 01/31/17 0918 01/31/17 0935  WBC 5.0  --   NEUTROABS 2.6  --   HGB 15.8 16.3  HCT 46.4 48.0  MCV 87.2  --   PLT 249  --    Basic Metabolic Panel:  Recent Labs Lab 01/31/17 0918 01/31/17 0935  NA 139 140  K 4.0 3.9  CL 105 104  CO2 23  --   GLUCOSE 143* 146*  BUN 11 13  CREATININE 1.39* 1.40*  CALCIUM 9.5  --    GFR: Estimated Creatinine Clearance: 75.5 mL/min (by C-G formula based on SCr of 1.4 mg/dL (H)). Liver Function Tests:  Recent Labs Lab 01/31/17 0918  AST 21  ALT 22  ALKPHOS 61  BILITOT 0.9  PROT 7.1  ALBUMIN 4.1   No results for input(s): LIPASE, AMYLASE in the last 168 hours. No results for input(s): AMMONIA in the last 168 hours. Coagulation Profile:  Recent Labs Lab 01/31/17 0918  INR 0.95   Cardiac Enzymes: No results for input(s): CKTOTAL, CKMB, CKMBINDEX, TROPONINI in the last 168 hours. BNP (last 3 results) No results for input(s): PROBNP in the last 8760 hours. HbA1C: No results for input(s): HGBA1C in the last 72 hours. CBG:  Recent Labs Lab 01/31/17 2134 02/01/17 0524 02/01/17 1132  GLUCAP 134* 128* 154*   Lipid Profile:  Recent Labs  02/01/17 0409  CHOL 147  HDL 28*  LDLCALC UNABLE TO CALCULATE IF TRIGLYCERIDE OVER 400 mg/dL  TRIG 415*  CHOLHDL 5.3   Thyroid Function Tests: No results for input(s): TSH, T4TOTAL, FREET4, T3FREE, THYROIDAB in the last 72 hours. Anemia Panel: No results for input(s): VITAMINB12, FOLATE, FERRITIN, TIBC, IRON, RETICCTPCT in the last 72 hours. Sepsis Labs: No results for input(s): PROCALCITON, LATICACIDVEN in the last 168 hours.  No results found for this or any previous visit (from the past 240 hour(s)).       Radiology Studies: Dg Chest 2 View  Result Date: 01/31/2017 CLINICAL DATA:  Chest pain . EXAM: CHEST  2 VIEW COMPARISON:  12/27/2015 . FINDINGS:  Mediastinum hilar structures normal. Cardiomegaly with normal pulmonary vascularity. No focal infiltrate. No pleural effusion or pneumothorax. No acute bony abnormality . IMPRESSION: 1. Mild cardiomegaly, no pulmonary venous could. 2. No acute pulmonary disease. Electronically Signed   By: Marcello Moores  Register   On: 01/31/2017 10:11   Ct Head Wo Contrast  Result Date: 01/31/2017 CLINICAL DATA:  Slurred speech. Left grip weakness. Symptoms began yesterday. EXAM: CT HEAD WITHOUT CONTRAST TECHNIQUE: Contiguous axial images were obtained from the base of the skull through the vertex without intravenous contrast. COMPARISON:  None. FINDINGS: Brain: Brainstem and cerebellum are normal. No abnormality seen in the right cerebral hemisphere. On the left, there is abnormal low density in the corpus callosum and pericallosal white matter which could represent ACA territory infarction. No evidence of hemorrhage. No hydrocephalus or extra-axial  collection. Vascular: No abnormal vascular finding.  No hyperdense vessels. Skull: Normal Sinuses/Orbits: Clear except for a small amount of mucus in the sphenoid sinus, probably not significant. Other: None significant IMPRESSION: Abnormal low-density in the left corpus callosum and pericallosal deep white matter that could represent recent left ACA infarction. Electronically Signed   By: Nelson Chimes M.D.   On: 01/31/2017 10:31   Mr Angiogram Head Wo Contrast  Result Date: 01/31/2017 CLINICAL DATA:  Weakness and tingling in both legs. Tingling in the hands. Dysarthria. EXAM: MRI HEAD WITHOUT CONTRAST MRA HEAD WITHOUT CONTRAST TECHNIQUE: Multiplanar, multiecho pulse sequences of the brain and surrounding structures were obtained without intravenous contrast. Angiographic images of the head were obtained using MRA technique without contrast. COMPARISON:  Head CT 01/31/2017 FINDINGS: MRI HEAD FINDINGS Some sequences are motion degraded, particularly the sagittal T1 sequence. Brain:  There is an acute infarct involving the genu and anterior body of the corpus callosum on the left with diffuse bilateral involvement of the mid and posterior body. Small foci of acute infarction are present in the adjacent left cingulate gyrus and in the left frontal white matter predominantly at the level of the centrum semiovale. There are punctate acute infarcts in both parietal lobes. There is no evidence of intracranial hemorrhage, mass, midline shift, or extra-axial fluid collection. The ventricles and sulci are normal in size. Small foci of T2 hyperintensity in the cerebral white matter separate from the areas of acute infarction, most notable in the right parietal lobe, are nonspecific but compatible with minimal chronic small vessel ischemic disease. Vascular: Major intracranial vascular flow voids are preserved. Skull and upper cervical spine: No focal marrow lesion identified. Sinuses/Orbits: Prior right cataract extraction. Small volume secretions in the sphenoid sinuses. Other: None. MRA HEAD FINDINGS The study is mildly motion degraded. The visualized distal vertebral arteries are patent with the right being dominant. PICA is appear patent, although the left PICA origin was not imaged. SCA origins are patent. Basilar artery is widely patent. There is a small left posterior communicating artery. PCAs are patent without evidence of significant proximal stenosis. The internal carotid arteries are widely patent from skullbase to carotid termini. The MCAs are patent without evidence of major branch occlusion or significant proximal stenosis. The A1 and proximal A2 segments are patent bilaterally without evidence of significant stenosis. There is mid left A2 occlusion. There is also other occlusion or severely diminished flow in the more distal right A2 segment, with evaluation limited by motion artifact and the vessels small size. No intracranial aneurysm is identified. IMPRESSION: 1. Acute infarction  involving a large portion of the corpus callosum, left greater than right. 2. Additional small left ACA/watershed infarcts. 3. Punctate acute right parietal infarct. 4. 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 5. Diminished flow versus occlusion of the distal right A2 segment. Electronically Signed   By: Logan Bores M.D.   On: 01/31/2017 15:12   Mr Brain Wo Contrast  Result Date: 01/31/2017 CLINICAL DATA:  Weakness and tingling in both legs. Tingling in the hands. Dysarthria. EXAM: MRI HEAD WITHOUT CONTRAST MRA HEAD WITHOUT CONTRAST TECHNIQUE: Multiplanar, multiecho pulse sequences of the brain and surrounding structures were obtained without intravenous contrast. Angiographic images of the head were obtained using MRA technique without contrast. COMPARISON:  Head CT 01/31/2017 FINDINGS: MRI HEAD FINDINGS Some sequences are motion degraded, particularly the sagittal T1 sequence. Brain: There is an acute infarct involving the genu and anterior body of the corpus callosum on the  left with diffuse bilateral involvement of the mid and posterior body. Small foci of acute infarction are present in the adjacent left cingulate gyrus and in the left frontal white matter predominantly at the level of the centrum semiovale. There are punctate acute infarcts in both parietal lobes. There is no evidence of intracranial hemorrhage, mass, midline shift, or extra-axial fluid collection. The ventricles and sulci are normal in size. Small foci of T2 hyperintensity in the cerebral white matter separate from the areas of acute infarction, most notable in the right parietal lobe, are nonspecific but compatible with minimal chronic small vessel ischemic disease. Vascular: Major intracranial vascular flow voids are preserved. Skull and upper cervical spine: No focal marrow lesion identified. Sinuses/Orbits: Prior right cataract extraction. Small volume secretions in the sphenoid sinuses. Other: None. MRA HEAD FINDINGS The study is  mildly motion degraded. The visualized distal vertebral arteries are patent with the right being dominant. PICA is appear patent, although the left PICA origin was not imaged. SCA origins are patent. Basilar artery is widely patent. There is a small left posterior communicating artery. PCAs are patent without evidence of significant proximal stenosis. The internal carotid arteries are widely patent from skullbase to carotid termini. The MCAs are patent without evidence of major branch occlusion or significant proximal stenosis. The A1 and proximal A2 segments are patent bilaterally without evidence of significant stenosis. There is mid left A2 occlusion. There is also other occlusion or severely diminished flow in the more distal right A2 segment, with evaluation limited by motion artifact and the vessels small size. No intracranial aneurysm is identified. IMPRESSION: 1. 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 2. Additional small left ACA/watershed infarcts. 3. Punctate acute right parietal infarct. 4. 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 5. Diminished flow versus occlusion of the distal right A2 segment. Electronically Signed   By: Logan Bores M.D.   On: 01/31/2017 15:12        Scheduled Meds: . aspirin  300 mg Rectal Daily   Or  . aspirin  325 mg Oral Daily  . carvedilol  6.25 mg Oral BID WC  . enoxaparin (LOVENOX) injection  30 mg Subcutaneous Q24H  . insulin aspart  0-5 Units Subcutaneous QHS  . insulin aspart  0-9 Units Subcutaneous TID WC  . simvastatin  20 mg Oral QHS   Continuous Infusions:   LOS: 1 day    Time spent: 35 minutes.     Elmarie Shiley, MD [facility] Pager [phone]  If 7PM-7AM, please contact night-coverage [email or web address] Password [email or web address] 02/01/2017, 1:52 PM

## 2017-02-01 NOTE — Evaluation (Signed)
Occupational Therapy Evaluation and Discharge Patient Details Name: Matthew [name] MRN: [id number] DOB: 02-20-1966 Today's Date: 02/01/2017    History of Present Illness Pt is a 51 y/o male who presents with BLE weakness, numbness in hands, and speech difficulty. MRI revealed acute infarct in the corpus callosum, additional small L ACA/watershed infarcts, and punctate acute E parietal infarct.   Clinical Impression   This 51 yo male admitted with above presents to acute OT with all education complete (including but not limited to   recommendation that pt sit to take shower until he feels steady enough to stand). No further acute OT needs, we will sign off.    Follow Up Recommendations  No OT follow up;Supervision/Assistance - 24 hour    Equipment Recommendations  None recommended by OT       Precautions / Restrictions Precautions Precautions: Fall Restrictions Weight Bearing Restrictions: No      Mobility Bed Mobility Overal bed mobility: Modified Independent                Transfers Overall transfer level: Modified independent                    Balance Overall balance assessment: Needs assistance Sitting-balance support: Feet supported;No upper extremity supported Sitting balance-Leahy Scale: Fair     Standing balance support: No upper extremity supported Standing balance-Leahy Scale: Fair                              ADL Overall ADL's : Needs assistance/impaired Eating/Feeding: Independent;Sitting   Grooming: Supervision/safety;Standing   Upper Body Bathing: Sitting;Standing;Min guard (for shower) Upper Body Bathing Details (indicate cue type and reason): S for sponge bath Lower Body Bathing: Min guard;Sit to/from stand Lower Body Bathing Details (indicate cue type and reason): S for sponge bath (for shower) Upper Body Dressing : Supervision/safety;Sitting   Lower Body Dressing: Supervision/safety;Sit to/from stand   Toilet  Transfer: Supervision/safety;Ambulation;Regular Toilet   Toileting- Water quality scientist and Hygiene: Supervision/safety;Sit to/from stand   Tub/ Shower Transfer: Min guard;Ambulation     General ADL Comments: increased A (minguard A in shower due to increased safety issues for this task with soap and water)     Vision Vision Assessment?: Yes Eye Alignment: Within Functional Limits Ocular Range of Motion: Within Functional Limits Alignment/Gaze Preference: Within Defined Limits Tracking/Visual Pursuits: Able to track stimulus in all quads without difficulty (initially pt had a difficult time tracking into right lower quadrant in a diagonal pattern; however once I checked both eyes separately there was no issue and followed by both eyes together this was not seen) Saccades: Within functional limits Convergence: Within functional limits Visual Fields: No apparent deficits          Pertinent Vitals/Pain Pain Assessment: No/denies pain     Hand Dominance Left   Extremity/Trunk Assessment Upper Extremity Assessment Upper Extremity Assessment: Overall WFL for tasks assessed (proprioception and light touch intact)           Communication Communication Communication: Expressive difficulties (slow to respond at times)   Cognition Arousal/Alertness: Awake/alert Behavior During Therapy: WFL for tasks assessed/performed Overall Cognitive Status: Within Functional Limits for tasks assessed                                Home Living Family/patient expects to be discharged to:: Private residence Living Arrangements: Spouse/significant other Available Help  at Discharge: Family;Available 24 hours/day Type of Home: House Home Access: Stairs to enter [employer] of Steps: 3 Entrance Stairs-Rails: Right;Left Home Layout: Two level Alternate Level Stairs-Number of Steps: Flight Alternate Level Stairs-Rails: Right;Left;Can reach both Bathroom Shower/Tub:  Walk-in Hydrologist: Standard Bathroom Accessibility: Yes   Home Equipment: Shower seat - built in          Prior Functioning/Environment Level of Independence: Independent        Comments: 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 Is on his feet for most of the day.               OT Goals(Current goals can be found in the care plan section) Acute Rehab OT Goals Patient Stated Goal: Home at d/c  OT Frequency:                End of Session Equipment Utilized During Treatment: Gait belt  Activity Tolerance: Patient tolerated treatment well Patient left: in bed;with call bell/phone within reach;with bed alarm set (with staff in room getting ready to perform test)   Time: 1420-1447 OT Time Calculation (min): 27 min Charges:  OT General Charges $OT Visit: 1 Procedure OT Evaluation $OT Eval Moderate Complexity: 1 Procedure OT Treatments $Self Care/Home Management : 8-22 mins  Almon Register [id number] 02/01/2017, 3:45 PM

## 2017-02-01 NOTE — Care Management Note (Signed)
Case Management Note  Patient Details  Name: Matthew [name] MRN: [id number] Date of Birth: October 07, 1966  Subjective/Objective:           Patient was admitted with CVA. Lives at home with spouse. CM will follow for discharge needs pending PT/OT evals and physician orders.          Action/Plan:   Expected Discharge Date:                  Expected Discharge Plan:     In-House Referral:     Discharge planning Services     Post Acute Care Choice:    Choice offered to:     DME Arranged:    DME Agency:     HH Arranged:    HH Agency:     Status of Service:     If discussed at [employer] of Stay Meetings, dates discussed:    Additional Comments:  Rolm Baptise, RN 02/01/2017, 4:18 PM

## 2017-02-01 NOTE — Progress Notes (Addendum)
STROKE TEAM PROGRESS NOTE   HISTORY OF PRESENT ILLNESS (per record) Matthew [name] is an 51 y.o. male who noted on Tuesday that he had decreased sensation in bilateral legs and weakness in bilateral legs. This progressed to bilateral leg weakness and tingling in both hands. . Today he noted dysarthria and came to ED to be evaluated. Currently he states his symptoms have not progressed today or resolved. He was last known well 01/29/2017, time unknown. Patient was not administered IV t-PA secondary to being outside of the window. He was admitted for further evaluation and treatment.   SUBJECTIVE (INTERVAL HISTORY) No family at bedside. Patient bilateral lower extremity muscle strengths much improved. Still complaining of bilateral hand tingling sensation. MRI showed bilateral ACA infarct, embolic pattern. Patient finally agreed with TEE on Monday.   OBJECTIVE Temp:  [98.2 F (36.8 C)-98.4 F (36.9 C)] 98.4 F (36.9 C) (02/16 0900) Pulse Rate:  [58-79] 72 (02/16 0900) Cardiac Rhythm: Normal sinus rhythm (02/16 0701) Resp:  [16-20] 16 (02/16 0900) BP: (137-177)/(80-106) 164/95 (02/16 0900) SpO2:  [95 %-100 %] 98 % (02/16 0900) Weight:  [109 kg (240 lb 6.4 oz)] 109 kg (240 lb 6.4 oz) (02/15 2050)  CBC:  Recent Labs Lab 01/31/17 0918 01/31/17 0935  WBC 5.0  --   NEUTROABS 2.6  --   HGB 15.8 16.3  HCT 46.4 48.0  MCV 87.2  --   PLT 249  --     Basic Metabolic Panel:  Recent Labs Lab 01/31/17 0918 01/31/17 0935  NA 139 140  K 4.0 3.9  CL 105 104  CO2 23  --   GLUCOSE 143* 146*  BUN 11 13  CREATININE 1.39* 1.40*  CALCIUM 9.5  --     Lipid Panel:    Component Value Date/Time   CHOL 147 02/01/2017 0409   TRIG 415 (H) 02/01/2017 0409   HDL 28 (L) 02/01/2017 0409   CHOLHDL 5.3 02/01/2017 0409   VLDL UNABLE TO CALCULATE IF TRIGLYCERIDE OVER 400 mg/dL 02/01/2017 0409   LDLCALC UNABLE TO CALCULATE IF TRIGLYCERIDE OVER 400 mg/dL 02/01/2017 0409   HgbA1c:  Lab Results   Component Value Date   HGBA1C 7.1 12/31/2016   Urine Drug Screen:    Component Value Date/Time   LABOPIA NONE DETECTED 03/02/2014 1359   COCAINSCRNUR NONE DETECTED 03/02/2014 1359   LABBENZ NONE DETECTED 03/02/2014 1359   AMPHETMU NONE DETECTED 03/02/2014 1359   THCU NONE DETECTED 03/02/2014 1359   LABBARB NONE DETECTED 03/02/2014 1359      IMAGING I have personally reviewed the radiological images below and agree with the radiology interpretations.  Dg Chest 2 View 01/31/2017 1. Mild cardiomegaly, no pulmonary venous could. 2. No acute pulmonary disease.   Ct Head Wo Contrast 01/31/2017 Abnormal low-density in the left corpus callosum and pericallosal deep white matter that could represent recent left ACA infarction.   Mr Brain 66 Contrast Mr Angiogram Head Wo Contrast 01/31/2017 1. 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 2. Additional small left ACA/watershed infarcts. 3. Punctate acute right parietal infarct. 4. 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 5. Diminished flow versus occlusion of the distal right A2 segment.   TTE - Left ventricle: The cavity size was mildly dilated. Wall   thickness was normal. Systolic function was mildly reduced. The   estimated ejection fraction was in the range of 45% to 50%.   Features are consistent with a pseudonormal left ventricular  filling pattern, with concomitant abnormal relaxation and   increased filling pressure (grade 2 diastolic dysfunction). - Aortic valve: Valve area (VTI): 4.14 cm^2. Valve area (Vmax):   4.18 cm^2. Valve area (Vmean): 3.78 cm^2.  CUS - Findings consistent with a 1-39 percent stenosis involving the right internal carotid artery and the left internal carotid artery. Intimal thickening bilaterally with mild soft plaque in the proximal left internal carotid artery. Bilateral vertebral arteries are patent and antegrade.  LE venous Doppler pending   PHYSICAL  EXAM  Temp:  [98.1 F (36.7 C)-98.4 F (36.9 C)] 98.1 F (36.7 C) (02/16 1713) Pulse Rate:  [58-79] 63 (02/16 1713) Resp:  [16-20] 20 (02/16 1713) BP: (144-177)/(86-106) 162/97 (02/16 1713) SpO2:  [95 %-100 %] 100 % (02/16 1713) Weight:  [240 lb 6.4 oz (109 kg)] 240 lb 6.4 oz (109 kg) (02/15 2050)  General - Well nourished, well developed, in no apparent distress.  Ophthalmologic - Fundi not visualized due to eye movement.  Cardiovascular - Regular rate and rhythm.  Mental Status -  Level of arousal and orientation to time, place, and person were intact. Language including expression, naming, repetition, comprehension was assessed and found intact. Fund of Knowledge was assessed and was intact.  Cranial Nerves II - XII - II - Visual field intact OU. III, IV, VI - Extraocular movements intact. V - Facial sensation intact bilaterally. VII - Facial movement intact bilaterally. VIII - Hearing & vestibular intact bilaterally. X - Palate elevates symmetrically. XI - Chin turning & shoulder shrug intact bilaterally. XII - Tongue protrusion intact.  Motor Strength - The patient's strength was normal in all extremities and pronator drift was absent.  Bulk was normal and fasciculations were absent.   Motor Tone - Muscle tone was assessed at the neck and appendages and was normal.  Reflexes - The patient's reflexes were diminished in all extremities and he had no pathological reflexes.  Sensory - Light touch, temperature/pinprick were assessed and were symmetrical.    Coordination - The patient had normal movements in the hands with no ataxia or dysmetria.  Tremor was absent.  Gait and Station - deferred.   ASSESSMENT/PLAN [name] is a 51 y.o. 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 He did not receive IV t-PA due to  being out of the window.   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 However, pt doses have stroke risk factor including DM, obesity, HLD, smoking  Resultant  BLE weakness near baseline now  CT head low-density L corpus callosum   MRI  left greater than right corpus callosum infarct. Small L ACA/watershed infarct. Punctate R parietal infarct.  MRA  L ACA occlusion and A2. Diminished flow versus occlusion R A2.  Carotid Doppler  unremarkable  2D Echo  EF 45-50%  LE venous Doppler pending  TEE to look for embolic source. Have requested arrangement with [facility] for Monday. (I have made patient NPO after midnight).   LDL UTC, high TG  HgbA1c pending, 7.1 in Jan  Lovenox 40 mg sq daily for VTE prophylaxis  Diet Carb Modified Fluid consistency: Thin; Room service appropriate? Yes  No antithrombotic prior to admission, now on aspirin 325 mg daily.   All Patient counseled to be compliant with his antithrombotic medications  Ongoing aggressive stroke risk factor management  Therapy recommendations:  OP PT  Disposition:  pending   Hypertension  Elevated  On Coreg Permissive hypertension (OK if < 220/120) but gradually normalize in 5-7 days Long-term BP goal normotensive  Hyperlipidemia  Home meds:  Zocor 20, resumed in hospital  LDL UTC, goal < 70  Add zetia due to high TG  Continue statin and Zetia at discharge  Diabetes  HgbA1c pending, 7.1 in Jan, goal < 7.0  Not in good control  SSI  Tobacco abuse  Current smoker  Smoking cessation counseling provided  Pt is willing to quit  Other Stroke Risk Factors  Obesity, Body mass index is 36.55 kg/m., recommend weight loss, diet and exercise as appropriate   Hx of 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 Today, patient BLE much improved, near baseline, clinically does not consistent with GBS, but more consistent with bilateral ACA infarct. Will hold off LP.  Hospital day # 1  Rosalin Hawking, MD PhD Stroke Neurology 02/01/2017 8:22 PM   To contact Stroke Continuity provider, please refer to [email or web address] After hours, contact General Neurology

## 2017-02-01 NOTE — Progress Notes (Signed)
*  PRELIMINARY RESULTS* Vascular Ultrasound Carotid Duplex (Doppler) has been completed.  Preliminary findings: Findings consistent with a 1-39 percent stenosis involving the right internal carotid artery and the left internal carotid artery. Intimal thickening bilaterally with mild soft plaque in the proximal left internal carotid artery. Bilateral vertebral arteries are patent and antegrade.  Everrett Coombe 02/01/2017, 4:32 PM

## 2017-02-01 NOTE — Evaluation (Signed)
Physical Therapy Evaluation Patient Details Name: Matthew [name] MRN: [id number] DOB: Feb 15, 1966 Today's Date: 02/01/2017   History of Present Illness  Pt is a 51 y/o male who presents with BLE weakness, numbness in hands, and speech difficulty. MRI revealed acute infarct in the corpus callosum, additional small L ACA/watershed infarcts, and punctate acute E parietal infarct.  Clinical Impression  Pt admitted with above diagnosis. Pt currently with functional limitations due to the deficits listed below (see PT Problem List). At the time of PT eval pt was able to perform transfers with modified independence, and ambulation/stairs with supervision for safety. Occasional unsteadiness noted however pt able to recover without assistance. Pt reports he will have transportation available for outpatient PT appointments, however if transportation availability changes, HHPT would be appropriate. Pt will benefit from skilled PT to increase their independence and safety with mobility to allow discharge to the venue listed below.       Follow Up Recommendations Outpatient PT;Supervision for mobility/OOB    Equipment Recommendations  None recommended by PT    Recommendations for Other Services       Precautions / Restrictions Precautions Precautions: Fall Restrictions Weight Bearing Restrictions: No      Mobility  Bed Mobility Overal bed mobility: Modified Independent             General bed mobility comments: No assist required to transition to EOB.   Transfers Overall transfer level: Modified independent Equipment used: None             General transfer comment: Pt able to power-up to full standing position without assistance. No unsteadiness noted in standing until pt attempted to shift weight to step-off into walking.   Ambulation/Gait Ambulation/Gait assistance: Min guard;Supervision Ambulation Distance (Feet): 200 Feet Assistive device: None Gait Pattern/deviations:  Step-through pattern;Decreased stride length;Antalgic;Trunk flexed Gait velocity: Decreased Gait velocity interpretation: Below normal speed for age/gender General Gait Details: Pt initially requiring min guard for safety but progressed to supervision status. Some unsteadiness noted but pt able to correct without assistance. No large losses of balance noted.   Stairs Stairs: Yes Stairs assistance: Supervision Stair Management: Two rails;Alternating pattern;Forwards Number of Stairs: 5 General stair comments: Pt able to complete stairs safely without assist and without cues.   Wheelchair Mobility    Modified Rankin (Stroke Patients Only) Modified Rankin (Stroke Patients Only) Pre-Morbid Rankin Score: No symptoms Modified Rankin: Moderately severe disability     Balance Overall balance assessment: Needs assistance Sitting-balance support: Feet supported;No upper extremity supported Sitting balance-Leahy Scale: Fair Sitting balance - Comments: Lost balance posteriorly when pt raised arms up for therapist to assess shoulder ROM.    Standing balance support: No upper extremity supported Standing balance-Leahy Scale: Fair                               Pertinent Vitals/Pain Pain Assessment: No/denies pain    Home Living Family/patient expects to be discharged to:: Private residence Living Arrangements: Spouse/significant other Available Help at Discharge: Family;Available 24 hours/day Type of Home: House Home Access: Stairs to enter Entrance Stairs-Rails: Psychiatric nurse of Steps: 3 Home Layout: Two level Home Equipment: Shower seat - built in      Prior Function Level of Independence: Independent         Comments: 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 Is on his feet for most of the day.      Hand Dominance   Dominant Hand:  Left    Extremity/Trunk Assessment   Upper Extremity Assessment Upper Extremity Assessment: Defer to OT  evaluation    Lower Extremity Assessment Lower Extremity Assessment: RLE deficits/detail;LLE deficits/detail RLE Deficits / Details: Strength grossly 4+/5 in quads, 4(L)4-(R)/5 in hamstrings, 4(L)4-(R)/5 in hip flexors.     Cervical / Trunk Assessment Cervical / Trunk Assessment: Other exceptions Cervical / Trunk Exceptions: Holding head in a flexed position with slight lateral bend to the R side. Able to correct with cues but not able to maintain.   Communication   Communication: Expressive difficulties  Cognition Arousal/Alertness: Awake/alert Behavior During Therapy: WFL for tasks assessed/performed Overall Cognitive Status: Within Functional Limits for tasks assessed                      General Comments      Exercises     Assessment/Plan    PT Assessment Patient needs continued PT services  PT Problem List Decreased strength;Decreased range of motion;Decreased activity tolerance;Decreased balance;Decreased mobility;Decreased knowledge of use of DME;Decreased safety awareness;Decreased knowledge of precautions;Impaired sensation;Decreased coordination          PT Treatment Interventions DME instruction;Gait training;Stair training;Functional mobility training;Therapeutic activities;Therapeutic exercise;Neuromuscular re-education;Patient/family education    PT Goals (Current goals can be found in the Care Plan section)  Acute Rehab PT Goals Patient Stated Goal: Home at d/c PT Goal Formulation: With patient Time For Goal Achievement: 02/08/17 Potential to Achieve Goals: Good    Frequency Min 3X/week   Barriers to discharge        Co-evaluation               End of Session Equipment Utilized During Treatment: Gait belt Activity Tolerance: Patient tolerated treatment well Patient left: in chair;with call bell/phone within reach;with chair alarm set;Other (comment) (MD present in room) Nurse Communication: Mobility status         Time:  [id number] PT Time Calculation (min) (ACUTE ONLY): 16 min   Charges:   PT Evaluation $PT Eval Moderate Complexity: 1 Procedure     PT G Codes:        Thelma Comp Feb 08, 2017, 12:18 PM   Rolinda Roan, PT, DPT Acute Rehabilitation Services Pager: [phone]

## 2017-02-02 ENCOUNTER — Inpatient Hospital Stay (HOSPITAL_COMMUNITY): Payer: 59

## 2017-02-02 DIAGNOSIS — I6349 Cerebral infarction due to embolism of other cerebral artery: Secondary | ICD-10-CM

## 2017-02-02 DIAGNOSIS — I639 Cerebral infarction, unspecified: Secondary | ICD-10-CM

## 2017-02-02 LAB — GLUCOSE, CAPILLARY
Glucose-Capillary: 147 mg/dL — ABNORMAL HIGH (ref 65–99)
Glucose-Capillary: 162 mg/dL — ABNORMAL HIGH (ref 65–99)
Glucose-Capillary: 89 mg/dL (ref 65–99)

## 2017-02-02 LAB — HEMOGLOBIN A1C
Hgb A1c MFr Bld: 7.1 % — ABNORMAL HIGH (ref 4.8–5.6)
Mean Plasma Glucose: 157 mg/dL

## 2017-02-02 MED ORDER — HYDRALAZINE HCL 10 MG PO TABS
10.0000 mg | ORAL_TABLET | Freq: Two times a day (BID) | ORAL | Status: DC
  Administered 2017-02-02 – 2017-02-03 (×2): 10 mg via ORAL
  Filled 2017-02-02 (×2): qty 1

## 2017-02-02 NOTE — Progress Notes (Signed)
PROGRESS NOTE    Matthew [name]  [id number] DOB: 1966-10-18 DOA: 01/31/2017 PCP: Arnoldo Morale, MD   Brief Narrative: Patient is a 51 year old male with history of combined systolic and diastolic CHF, EF 99991111, hypertension, obesity, diabetes mellitus, hyperlipidemia presented to ED with decreased sensation and weakness in his bilateral lower legs since Tuesday for last 2 days. He also noticed tingling in his both hands for the last 2 days as well. 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 He also noticed tingling in his hands at the same time, which subsequently resolved right lower quadrant chest pain. He continued working. His symptoms did not improve. Today he noticed dysarthria and came to the ER to be evaluated. Otherwise he denied any difficulty swallowing, blurry vision, headache.    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 Additional small left ACA/watershed infarcts. Punctate acute right parietal infarct. 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 LDL unable to be calculated, triglycerides 415. Started statins. zetia  Permissive HTN. Continue with coreg and resume lasix. will continue to hold; hydralazine, lisinopril, Aldactone, imdur.  Follow ECHO EF 45 -50 % , doppler 1-39 % stenosis  ICA .  Neuro recommending TEE> plan for TEE on Monday  Agree to stay for TEE on Monday   Right-sided chest tightness Troponin 1 negative, BNP 15.8 ECHO ef 45 %  -chest pain free.   Chronic combined systolic and diastolic congestive heart failure ([place]), history of ischemic cardiomyopathy Resume lasix, continue with coreg.   Mild acute on  CKD (chronic kidney disease) stage 2, GFR 60-89 ml/min. Cr baseline 1.4  Monitor on lasix,  Stable.   Diabetes mellitus type II, controlled ([place]); Hb-A1c pending.  Hold Glipizide.  SSI.   DVT prophylaxis: Lovenox.  Code Status: Full code.  Family Communication: Care discussed with patient and wife.  Disposition Plan: await work up for stroke. TEE on Monday   Consultants:   Neurology    Procedures:   ECHO  Doppler;    Antimicrobials: none   Subjective: Speech improving.     Objective: Vitals:   02/02/17 0224 02/02/17 0517 02/02/17 1020 02/02/17 1406  BP: (!) 146/90 (!) 158/105 134/88 (!) 168/102  Pulse: 65 78 65 81  Resp: 20 20 20 20   Temp: 97.9 F (36.6 C) 97.9 F (36.6 C) 97.9 F (36.6 C) 98.8 F (37.1 C)  TempSrc: Oral Oral Oral Oral  SpO2: 100% 100% 97% 98%  Weight:      Height:        Intake/Output Summary (Last 24 hours) at 02/02/17 1443 Last data filed at 02/02/17 0900  Gross per 24 hour  Intake              600 ml  Output                0  ml  Net              600 ml   Filed Weights   01/31/17 0912 01/31/17 2050  Weight: 113.4 kg (250 lb) 109 kg (240 lb 6.4 oz)    Examination:  General exam: Appears calm and comfortable  Respiratory system: Clear to auscultation. Respiratory effort normal. Cardiovascular system: S1 & S2 heard, RRR. No JVD, murmurs, rubs, gallops or clicks. No pedal edema. Gastrointestinal system: Abdomen is nondistended, soft and nontender. No organomegaly or masses felt. Normal bowel sounds heard. Central nervous system: Alert and oriented, dysarthric speech. strength lower  extremities 4/5 Skin: No rashes, lesions or ulcers Psychiatry: Judgement and insight appear normal. Mood & affect appropriate.     Data Reviewed: I have personally reviewed following labs and imaging studies  CBC:  Recent Labs Lab 01/31/17 0918 01/31/17 0935  WBC 5.0  --   NEUTROABS 2.6  --   HGB 15.8 16.3  HCT 46.4 48.0  MCV 87.2  --   PLT 249  --    Basic Metabolic Panel:  Recent Labs Lab 01/31/17 0918 01/31/17 0935  NA 139 140  K 4.0 3.9  CL 105 104  CO2 23  --   GLUCOSE 143* 146*  BUN 11 13  CREATININE 1.39* 1.40*  CALCIUM 9.5  --    GFR: Estimated Creatinine Clearance: 75.5 mL/min (by C-G formula based on SCr of 1.4 mg/dL (H)). Liver Function Tests:  Recent Labs Lab 01/31/17 0918  AST 21  ALT 22  ALKPHOS 61  BILITOT 0.9  PROT 7.1  ALBUMIN 4.1   No results for input(s): LIPASE, AMYLASE in the last 168 hours. No results for input(s): AMMONIA in the last 168 hours. Coagulation Profile:  Recent Labs Lab 01/31/17 0918  INR 0.95   Cardiac Enzymes: No results for input(s): CKTOTAL, CKMB, CKMBINDEX, TROPONINI in the last 168 hours. BNP (last 3 results) No results for input(s): PROBNP in the last 8760 hours. HbA1C:  Recent Labs  02/01/17 0409  HGBA1C 7.1*   CBG:  Recent Labs Lab 02/01/17 1132 02/01/17 1710 02/01/17 2057 02/02/17 0629 02/02/17 1110  GLUCAP 154* 82 181* 147* 162*   Lipid Profile:  Recent Labs  02/01/17 0409  CHOL 147  HDL 28*  LDLCALC UNABLE TO CALCULATE IF TRIGLYCERIDE OVER 400 mg/dL  TRIG 415*  CHOLHDL 5.3   Thyroid Function Tests: No results for input(s): TSH, T4TOTAL, FREET4, T3FREE, THYROIDAB in the last 72 hours. Anemia Panel: No results for input(s): VITAMINB12, FOLATE, FERRITIN, TIBC, IRON, RETICCTPCT in the last 72 hours. Sepsis Labs: No results for input(s): PROCALCITON, LATICACIDVEN in the last 168 hours.  No results found for this or any previous visit (from the past 240 hour(s)).        Radiology Studies: Mr Angiogram Head Wo Contrast  Result Date: 01/31/2017 CLINICAL DATA:  Weakness and tingling in both legs. Tingling in the hands. Dysarthria. EXAM: MRI HEAD WITHOUT CONTRAST MRA HEAD WITHOUT CONTRAST TECHNIQUE: Multiplanar, multiecho pulse sequences of the brain and surrounding structures were obtained without intravenous contrast. Angiographic images of the head were obtained using MRA technique without contrast. COMPARISON:  Head CT 01/31/2017 FINDINGS: MRI HEAD FINDINGS Some sequences are motion degraded, particularly the sagittal T1 sequence. Brain: There is an acute infarct involving the genu and anterior body of the corpus callosum on the left with diffuse bilateral involvement of the mid and posterior body. Small foci of acute infarction are present in the adjacent left cingulate gyrus and  in the left frontal white matter predominantly at the level of the centrum semiovale. There are punctate acute infarcts in both parietal lobes. There is no evidence of intracranial hemorrhage, mass, midline shift, or extra-axial fluid collection. The ventricles and sulci are normal in size. Small foci of T2 hyperintensity in the cerebral white matter separate from the areas of acute infarction, most notable in the right parietal lobe, are nonspecific but compatible with minimal chronic small vessel ischemic disease. Vascular: Major intracranial vascular flow voids are preserved. Skull and upper cervical spine: No focal marrow lesion identified. Sinuses/Orbits: Prior right cataract extraction. Small volume secretions in the sphenoid sinuses. Other: None. MRA HEAD FINDINGS The study is mildly motion degraded. The visualized distal vertebral arteries are patent with the right being dominant. PICA is appear patent, although the left PICA origin was not imaged. SCA origins are patent. Basilar artery is widely patent. There is a small left posterior communicating artery. PCAs are patent without  evidence of significant proximal stenosis. The internal carotid arteries are widely patent from skullbase to carotid termini. The MCAs are patent without evidence of major branch occlusion or significant proximal stenosis. The A1 and proximal A2 segments are patent bilaterally without evidence of significant stenosis. There is mid left A2 occlusion. There is also other occlusion or severely diminished flow in the more distal right A2 segment, with evaluation limited by motion artifact and the vessels small size. No intracranial aneurysm is identified. IMPRESSION: 1. 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 2. Additional small left ACA/watershed infarcts. 3. Punctate acute right parietal infarct. 4. 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 5. Diminished flow versus occlusion of the distal right A2 segment. Electronically Signed   By: Logan Bores M.D.   On: 01/31/2017 15:12   Mr Brain Wo Contrast  Result Date: 01/31/2017 CLINICAL DATA:  Weakness and tingling in both legs. Tingling in the hands. Dysarthria. EXAM: MRI HEAD WITHOUT CONTRAST MRA HEAD WITHOUT CONTRAST TECHNIQUE: Multiplanar, multiecho pulse sequences of the brain and surrounding structures were obtained without intravenous contrast. Angiographic images of the head were obtained using MRA technique without contrast. COMPARISON:  Head CT 01/31/2017 FINDINGS: MRI HEAD FINDINGS Some sequences are motion degraded, particularly the sagittal T1 sequence. Brain: There is an acute infarct involving the genu and anterior body of the corpus callosum on the left with diffuse bilateral involvement of the mid and posterior body. Small foci of acute infarction are present in the adjacent left cingulate gyrus and in the left frontal white matter predominantly at the level of the centrum semiovale. There are punctate acute infarcts in both parietal lobes. There is no evidence of intracranial hemorrhage, mass, midline  shift, or extra-axial fluid collection. The ventricles and sulci are normal in size. Small foci of T2 hyperintensity in the cerebral white matter separate from the areas of acute infarction, most notable in the right parietal lobe, are nonspecific but compatible with minimal chronic small vessel ischemic disease. Vascular: Major intracranial vascular flow voids are preserved. Skull and upper cervical spine: No focal marrow lesion identified. Sinuses/Orbits: Prior right cataract extraction. Small volume secretions in the sphenoid sinuses. Other: None. MRA HEAD FINDINGS The study is mildly motion degraded. The visualized distal vertebral arteries are patent with the right being dominant. PICA is appear patent, although the left PICA origin was not imaged. SCA origins are patent. Basilar artery is widely patent. There is a small left posterior communicating artery. PCAs are  patent without evidence of significant proximal stenosis. The internal carotid arteries are widely patent from skullbase to carotid termini. The MCAs are patent without evidence of major branch occlusion or significant proximal stenosis. The A1 and proximal A2 segments are patent bilaterally without evidence of significant stenosis. There is mid left A2 occlusion. There is also other occlusion or severely diminished flow in the more distal right A2 segment, with evaluation limited by motion artifact and the vessels small size. No intracranial aneurysm is identified. IMPRESSION: 1. 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 2. Additional small left ACA/watershed infarcts. 3. Punctate acute right parietal infarct. 4. 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 5. Diminished flow versus occlusion of the distal right A2 segment. Electronically Signed   By: Logan Bores M.D.   On: 01/31/2017 15:12        Scheduled Meds: . aspirin  300 mg Rectal Daily   Or  . aspirin  325 mg Oral Daily  . carvedilol   6.25 mg Oral BID WC  . enoxaparin (LOVENOX) injection  40 mg Subcutaneous Q24H  . ezetimibe  10 mg Oral Daily  . furosemide  40 mg Oral Daily  . insulin aspart  0-5 Units Subcutaneous QHS  . insulin aspart  0-9 Units Subcutaneous TID WC  . simvastatin  20 mg Oral QHS   Continuous Infusions:   LOS: 2 days    Time spent: 35 minutes.     Elmarie Shiley, MD [facility] Pager [phone]  If 7PM-7AM, please contact night-coverage [email or web address] Password [facility] 02/02/2017, 2:43 PM

## 2017-02-02 NOTE — Progress Notes (Signed)
VASCULAR LAB PRELIMINARY  PRELIMINARY  PRELIMINARY  PRELIMINARY  Bilateral lower extremity venous duplex completed.    Preliminary report:  There is no DVT or SVT noted in the bilateral lower extremities.   Zacaria Pousson, RVT 02/02/2017, 10:05 AM

## 2017-02-02 NOTE — Progress Notes (Signed)
STROKE TEAM PROGRESS NOTE   HISTORY OF PRESENT ILLNESS (per record) Matthew [name] is an 51 y.o. male who noted on Tuesday that he had decreased sensation in bilateral legs and weakness in bilateral legs. This progressed to bilateral leg weakness and tingling in both hands. . Today he noted dysarthria and came to ED to be evaluated. Currently he states his symptoms have not progressed today or resolved. He was last known well 01/29/2017, time unknown. Patient was not administered IV t-PA secondary to being outside of the window. He was admitted for further evaluation and treatment.   SUBJECTIVE (INTERVAL HISTORY) No family at bedside. Patient bilateral lower extremity muscle strengths much improved and he is ambulating independently. MRI showed bilateral ACA infarct, embolic pattern. Patient agreed with TEE on Monday. He wanted to be discharged home and then decided to stay and wait.    OBJECTIVE Temp:  [97.9 F (36.6 C)-98.4 F (36.9 C)] 97.9 F (36.6 C) (02/17 0517) Pulse Rate:  [63-78] 78 (02/17 0517) Cardiac Rhythm: Normal sinus rhythm (02/16 1900) Resp:  [16-20] 20 (02/17 0517) BP: (146-164)/(90-105) 158/105 (02/17 0517) SpO2:  [98 %-100 %] 100 % (02/17 0517)  CBC:   Recent Labs Lab 01/31/17 0918 01/31/17 0935  WBC 5.0  --   NEUTROABS 2.6  --   HGB 15.8 16.3  HCT 46.4 48.0  MCV 87.2  --   PLT 249  --     Basic Metabolic Panel:   Recent Labs Lab 01/31/17 0918 01/31/17 0935  NA 139 140  K 4.0 3.9  CL 105 104  CO2 23  --   GLUCOSE 143* 146*  BUN 11 13  CREATININE 1.39* 1.40*  CALCIUM 9.5  --     Lipid Panel:     Component Value Date/Time   CHOL 147 02/01/2017 0409   TRIG 415 (H) 02/01/2017 0409   HDL 28 (L) 02/01/2017 0409   CHOLHDL 5.3 02/01/2017 0409   VLDL UNABLE TO CALCULATE IF TRIGLYCERIDE OVER 400 mg/dL 02/01/2017 0409   LDLCALC UNABLE TO CALCULATE IF TRIGLYCERIDE OVER 400 mg/dL 02/01/2017 0409   HgbA1c:  Lab Results  Component Value Date   HGBA1C 7.1 (H) 02/01/2017   Urine Drug Screen:     Component Value Date/Time   LABOPIA NONE DETECTED 03/02/2014 1359   COCAINSCRNUR NONE DETECTED 03/02/2014 1359   LABBENZ NONE DETECTED 03/02/2014 1359   AMPHETMU NONE DETECTED 03/02/2014 1359   THCU NONE DETECTED 03/02/2014 1359   LABBARB NONE DETECTED 03/02/2014 1359      IMAGING I have personally reviewed the radiological images below and agree with the radiology interpretations.  Dg Chest 2 View 01/31/2017 1. Mild cardiomegaly, no pulmonary venous could. 2. No acute pulmonary disease.   Ct Head Wo Contrast 01/31/2017 Abnormal low-density in the left corpus callosum and pericallosal deep white matter that could represent recent left ACA infarction.   Mr Brain 70 Contrast Mr Angiogram Head Wo Contrast 01/31/2017 1. 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 2. Additional small left ACA/watershed infarcts. 3. Punctate acute right parietal infarct. 4. 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 5. Diminished flow versus occlusion of the distal right A2 segment.   TTE - Left ventricle: The cavity size was mildly dilated. Wall   thickness was normal. Systolic function was mildly reduced. The   estimated ejection fraction was in the range of 45% to 50%.   Features are consistent with a pseudonormal left ventricular  filling pattern, with concomitant abnormal relaxation and   increased filling pressure (grade 2 diastolic dysfunction). - Aortic valve: Valve area (VTI): 4.14 cm^2. Valve area (Vmax):   4.18 cm^2. Valve area (Vmean): 3.78 cm^2.  CUS - Findings consistent with a 1-39 percent stenosis involving the right internal carotid artery and the left internal carotid artery. Intimal thickening bilaterally with mild soft plaque in the proximal left internal carotid artery. Bilateral vertebral arteries are patent and antegrade.  LE venous Doppler pending   PHYSICAL EXAM  Temp:  [97.9  F (36.6 C)-98.4 F (36.9 C)] 97.9 F (36.6 C) (02/17 0517) Pulse Rate:  [63-78] 78 (02/17 0517) Resp:  [16-20] 20 (02/17 0517) BP: (146-164)/(90-105) 158/105 (02/17 0517) SpO2:  [98 %-100 %] 100 % (02/17 0517)   Exam is stable today, no changes.  General - Well nourished, well developed, in no apparent distress.  Ophthalmologic - Fundi not visualized due to eye movement.  Cardiovascular - Regular rate and rhythm.  Mental Status -  Level of arousal and orientation to time, place, and person were intact. Language including expression, naming, repetition, comprehension was assessed and found intact. Fund of Knowledge was assessed and was intact.  Cranial Nerves II - XII - II - Visual field intact OU. III, IV, VI - Extraocular movements intact. V - Facial sensation intact bilaterally. VII - Facial movement intact bilaterally. VIII - Hearing & vestibular intact bilaterally. X - Palate elevates symmetrically. XI - Chin turning & shoulder shrug intact bilaterally. XII - Tongue protrusion intact.  Motor Strength - The patient's strength was normal in all extremities and pronator drift was absent.  Bulk was normal and fasciculations were absent.   Motor Tone - Muscle tone was assessed at the neck and appendages and was normal.  Reflexes - The patient's reflexes were diminished in all extremities and he had no pathological reflexes.  Sensory - Light touch, temperature/pinprick were assessed and were symmetrical.    Coordination - The patient had normal movements in the hands with no ataxia or dysmetria.  Tremor was absent.  Gait and Station - deferred.   ASSESSMENT/PLAN [name] is a 51 y.o. 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 He did not receive IV t-PA due to being out of the window.   Stroke:  bilateral ACA  infarcts in setting of bilateral A2 occlusion, embolic secondary to unknown source. However, pt doses have stroke risk factor including DM, obesity, HLD, smoking  Resultant  BLE weakness near baseline now  CT head low-density L corpus callosum   MRI  left greater than right corpus callosum infarct. Small L ACA/watershed infarct. Punctate R parietal infarct.  MRA  L ACA occlusion and A2. Diminished flow versus occlusion R A2.  Carotid Doppler  unremarkable  2D Echo  EF 45-50%  LE venous Doppler pending  TEE to look for embolic source. Have requested arrangement with [facility] for Monday. (I have made patient NPO after midnight).   LDL UTC, high TG  HgbA1c 7.1  Lovenox 40 mg sq daily for VTE prophylaxis Diet Carb Modified Fluid consistency: Thin; Room service appropriate? Yes Diet NPO time specified  No antithrombotic prior to admission, now on aspirin 325 mg daily.   All Patient counseled to be compliant with his antithrombotic medications  Ongoing aggressive stroke risk factor management  Therapy recommendations:  OP PT  Disposition:  pending   Hypertension  Elevated  On Coreg Permissive hypertension (OK if < 220/120) but gradually normalize in 5-7 days Long-term BP goal normotensive  Hyperlipidemia  Home meds:  Zocor 20, resumed in hospital  LDL UTC, goal < 70  Add zetia due to high TG  Continue statin and Zetia at discharge  Diabetes  HgbA1c pending, 7.1 goal < 6.5  Not in good control  SSI  Tobacco abuse  Current smoker  Smoking cessation counseling provided  Pt is willing to quit  Other Stroke Risk Factors  Obesity, Body mass index is 36.55 kg/m., recommend weight loss, diet and exercise as appropriate   Hx of combined systolic and diastolic CHF with EF 99991111  Other Active Problems  Transient right sided chest tightness  Acute on chronic kidney disease stage II  Initial concerning for GBS due to  bilateral weakness, areflexia, however LP not done. Today, patient BLE much improved, near baseline, clinically does not consistent with GBS, but more consistent with bilateral ACA infarct. Will hold off LP.  Hospital day # 2    Personally examined patient and images, and have participated in and made any corrections needed to history, physical, neuro exam,assessment and plan as stated above.  I have personally obtained the history, evaluated lab date, reviewed imaging studies and agree with radiology interpretations.    Sarina Ill, MD Stroke Neurology [facility]   To contact Stroke Continuity provider, please refer to [email or web address] After hours, contact General Neurology

## 2017-02-03 DIAGNOSIS — I638 Other cerebral infarction: Secondary | ICD-10-CM

## 2017-02-03 LAB — GLUCOSE, CAPILLARY
Glucose-Capillary: 170 mg/dL — ABNORMAL HIGH (ref 65–99)
Glucose-Capillary: 105 mg/dL — ABNORMAL HIGH (ref 65–99)
Glucose-Capillary: 149 mg/dL — ABNORMAL HIGH (ref 65–99)
Glucose-Capillary: 126 mg/dL — ABNORMAL HIGH (ref 65–99)
Glucose-Capillary: 123 mg/dL — ABNORMAL HIGH (ref 65–99)

## 2017-02-03 NOTE — Progress Notes (Signed)
PROGRESS NOTE    Matthew [name]  [id number] DOB: Nov 26, 1966 DOA: 01/31/2017 PCP: Arnoldo Morale, MD   Brief Narrative: Patient is a 51 year old male with history of combined systolic and diastolic CHF, EF 99991111, hypertension, obesity, diabetes mellitus, hyperlipidemia presented to ED with decreased sensation and weakness in his bilateral lower legs since Tuesday for last 2 days. He also noticed tingling in his both hands for the last 2 days as well. 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 He also noticed tingling in his hands at the same time, which subsequently resolved right lower quadrant chest pain. He continued working. His symptoms did not improve. Today he noticed dysarthria and came to the ER to be evaluated. Otherwise he denied any difficulty swallowing, blurry vision, headache.    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 Additional small left ACA/watershed infarcts. Punctate acute right parietal infarct. 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 LDL unable to be calculated, triglycerides 415. Started statins. zetia  Permissive HTN. Continue with coreg and resume lasix. will continue to hold; hydralazine, lisinopril, Aldactone, imdur.  Follow ECHO EF 45 -50 % , doppler 1-39 % stenosis  ICA .  Neuro recommending TEE> plan for TEE on Monday  Agree to stay for TEE on Monday  Will add holder parameter for BP medication.   Right-sided chest tightness Troponin 1 negative, BNP 15.8 ECHO ef 45 %  -chest pain free.   Chronic combined systolic and diastolic congestive heart failure ([place]), history of ischemic cardiomyopathy Resume lasix, continue with coreg.   Mild acute on  CKD (chronic kidney disease) stage 2, GFR 60-89 ml/min. Cr baseline 1.4  Monitor on lasix,  Stable.   Diabetes mellitus type II, controlled ([place]); Hb-A1c 7.1 Hold Glipizide.  SSI.   DVT prophylaxis: Lovenox.  Code Status: Full code.  Family Communication: Care discussed with patient and wife.  Disposition Plan: await work up for stroke. TEE on Monday   Consultants:   Neurology    Procedures:   ECHO  Doppler;    Antimicrobials: none   Subjective: He is doing ok, feels his condition is stable, no worsening of neuro symptoms.     Objective: Vitals:   02/02/17 2100 02/03/17 0100 02/03/17 0500 02/03/17 0918  BP: 140/88 (!) 137/92 (!) 141/76 133/77  Pulse: 66 72 74 81  Resp:  18 18 18   Temp: 97.5 F (36.4 C) 97.9 F (36.6 C) 98.2 F (36.8 C) 98.6 F (37 C)  TempSrc: Oral Oral Oral Oral  SpO2: 98% 99% 99% 99%  Weight:      Height:        Intake/Output Summary (Last 24 hours) at 02/03/17 1335 Last data filed at 02/03/17 0900  Gross per 24 hour  Intake              700 ml  Output                0 ml  Net              700 ml   Filed Weights   01/31/17 0912 01/31/17 2050  Weight: 113.4 kg (250 lb) 109 kg (240 lb 6.4 oz)    Examination:  General exam: Appears calm and comfortable  Respiratory system: Clear to auscultation. Respiratory effort normal. Cardiovascular system: S1 & S2 heard, RRR. No JVD, murmurs, rubs, gallops or clicks. No pedal edema. Gastrointestinal system: Abdomen is nondistended, soft and nontender. No organomegaly or masses felt. Normal bowel sounds  heard. Central nervous system: Alert and oriented, dysarthric speech. strength lower extremities 4/5 Skin: No rashes, lesions or ulcers Psychiatry: Judgement and insight appear normal. Mood & affect appropriate.     Data Reviewed: I have personally reviewed following labs and imaging studies  CBC:  Recent Labs Lab 01/31/17 0918 01/31/17 0935  WBC 5.0  --   NEUTROABS 2.6  --   HGB 15.8 16.3  HCT 46.4 48.0  MCV 87.2  --   PLT 249  --    Basic Metabolic Panel:  Recent Labs Lab 01/31/17 0918 01/31/17 0935  NA 139 140  K 4.0 3.9  CL 105 104  CO2 23  --   GLUCOSE 143* 146*  BUN 11 13  CREATININE 1.39* 1.40*  CALCIUM 9.5  --    GFR: Estimated Creatinine Clearance: 75.5 mL/min (by C-G formula based on SCr of 1.4 mg/dL (H)). Liver Function Tests:  Recent Labs Lab 01/31/17 0918  AST 21  ALT 22  ALKPHOS 61  BILITOT 0.9  PROT 7.1  ALBUMIN 4.1   No results for input(s): LIPASE, AMYLASE in the last 168 hours. No results for input(s): AMMONIA in the last 168 hours. Coagulation Profile:  Recent Labs Lab 01/31/17 0918  INR 0.95   Cardiac Enzymes: No results for input(s): CKTOTAL, CKMB, CKMBINDEX, TROPONINI in the last 168 hours. BNP (last 3 results) No results for input(s): PROBNP in the last 8760 hours. HbA1C:  Recent Labs  02/01/17 0409  HGBA1C 7.1*   CBG:  Recent Labs Lab 02/02/17 1110 02/02/17 1646 02/02/17 2111 02/03/17 0606 02/03/17 1105  GLUCAP 162* 89 170* 105* 149*   Lipid Profile:  Recent Labs  02/01/17 0409  CHOL 147  HDL 28*  LDLCALC UNABLE TO CALCULATE IF TRIGLYCERIDE OVER 400 mg/dL  TRIG 415*  CHOLHDL 5.3   Thyroid Function Tests: No results for input(s): TSH, T4TOTAL, FREET4, T3FREE, THYROIDAB in the last 72 hours. Anemia Panel: No results for input(s): VITAMINB12, FOLATE, FERRITIN, TIBC, IRON, RETICCTPCT in the last 72 hours. Sepsis Labs: No results for input(s): PROCALCITON, LATICACIDVEN in the last 168 hours.  No  results found for this or any previous visit (from the past 240 hour(s)).       Radiology Studies: No results found.      Scheduled Meds: . aspirin  300 mg Rectal Daily   Or  . aspirin  325 mg Oral Daily  . carvedilol  6.25 mg Oral BID WC  . enoxaparin (LOVENOX) injection  40 mg Subcutaneous Q24H  . ezetimibe  10 mg Oral Daily  . furosemide  40 mg Oral Daily  . insulin aspart  0-5 Units Subcutaneous QHS  . insulin aspart  0-9 Units Subcutaneous TID WC  . simvastatin  20 mg Oral QHS   Continuous Infusions:   LOS: 3 days    Time spent: 35  minutes.     Elmarie Shiley, MD [facility] Pager [phone]  If 7PM-7AM, please contact night-coverage [email or web address] Password [email or web address] 02/03/2017, 1:35 PM

## 2017-02-03 NOTE — Progress Notes (Signed)
STROKE TEAM PROGRESS NOTE   HISTORY OF PRESENT ILLNESS (per record) Matthew [name] is an 51 y.o. male who noted on Tuesday that he had decreased sensation in bilateral legs and weakness in bilateral legs. This progressed to bilateral leg weakness and tingling in both hands. . Today he noted dysarthria and came to ED to be evaluated. Currently he states his symptoms have not progressed today or resolved. He was last known well 01/29/2017, time unknown. Patient was not administered IV t-PA secondary to being outside of the window. He was admitted for further evaluation and treatment.   SUBJECTIVE (INTERVAL HISTORY) No family at bedside. Patient bilateral lower extremity muscle strengths much improved and he is ambulating independently. Just awaiting TEE on Monday.  MRI showed bilateral ACA infarct, embolic pattern. Patient agreed with TEE on Monday. He wanted to be discharged home and then decided to stay and wait.    OBJECTIVE Temp:  [97.5 F (36.4 C)-98.8 F (37.1 C)] 98.2 F (36.8 C) (02/18 0500) Pulse Rate:  [65-81] 74 (02/18 0500) Cardiac Rhythm: Normal sinus rhythm (02/17 1900) Resp:  [18-20] 18 (02/18 0500) BP: (134-168)/(76-102) 141/76 (02/18 0500) SpO2:  [97 %-99 %] 99 % (02/18 0500)  CBC:   Recent Labs Lab 01/31/17 0918 01/31/17 0935  WBC 5.0  --   NEUTROABS 2.6  --   HGB 15.8 16.3  HCT 46.4 48.0  MCV 87.2  --   PLT 249  --     Basic Metabolic Panel:   Recent Labs Lab 01/31/17 0918 01/31/17 0935  NA 139 140  K 4.0 3.9  CL 105 104  CO2 23  --   GLUCOSE 143* 146*  BUN 11 13  CREATININE 1.39* 1.40*  CALCIUM 9.5  --     Lipid Panel:     Component Value Date/Time   CHOL 147 02/01/2017 0409   TRIG 415 (H) 02/01/2017 0409   HDL 28 (L) 02/01/2017 0409   CHOLHDL 5.3 02/01/2017 0409   VLDL UNABLE TO CALCULATE IF TRIGLYCERIDE OVER 400 mg/dL 02/01/2017 0409   LDLCALC UNABLE TO CALCULATE IF TRIGLYCERIDE OVER 400 mg/dL 02/01/2017 0409   HgbA1c:  Lab Results   Component Value Date   HGBA1C 7.1 (H) 02/01/2017   Urine Drug Screen:     Component Value Date/Time   LABOPIA NONE DETECTED 03/02/2014 1359   COCAINSCRNUR NONE DETECTED 03/02/2014 1359   LABBENZ NONE DETECTED 03/02/2014 1359   AMPHETMU NONE DETECTED 03/02/2014 1359   THCU NONE DETECTED 03/02/2014 1359   LABBARB NONE DETECTED 03/02/2014 1359      IMAGING I have personally reviewed the radiological images below and agree with the radiology interpretations.  Dg Chest 2 View 01/31/2017 1. Mild cardiomegaly, no pulmonary venous could. 2. No acute pulmonary disease.   Ct Head Wo Contrast 01/31/2017 Abnormal low-density in the left corpus callosum and pericallosal deep white matter that could represent recent left ACA infarction.   Mr Brain 29 Contrast Mr Angiogram Head Wo Contrast 01/31/2017 1. 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 2. Additional small left ACA/watershed infarcts. 3. Punctate acute right parietal infarct. 4. 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 5. Diminished flow versus occlusion of the distal right A2 segment.   TTE - Left ventricle: The cavity size was mildly dilated. Wall   thickness was normal. Systolic function was mildly reduced. The   estimated ejection fraction was in the range of 45% to 50%.   Features are consistent  with a pseudonormal left ventricular   filling pattern, with concomitant abnormal relaxation and   increased filling pressure (grade 2 diastolic dysfunction). - Aortic valve: Valve area (VTI): 4.14 cm^2. Valve area (Vmax):   4.18 cm^2. Valve area (Vmean): 3.78 cm^2.  CUS - Findings consistent with a 1-39 percent stenosis involving the right internal carotid artery and the left internal carotid artery. Intimal thickening bilaterally with mild soft plaque in the proximal left internal carotid artery. Bilateral vertebral arteries are patent and antegrade.  LE venous Doppler  pending   PHYSICAL EXAM  Temp:  [97.5 F (36.4 C)-98.8 F (37.1 C)] 98.2 F (36.8 C) (02/18 0500) Pulse Rate:  [65-81] 74 (02/18 0500) Resp:  [18-20] 18 (02/18 0500) BP: (134-168)/(76-102) 141/76 (02/18 0500) SpO2:  [97 %-99 %] 99 % (02/18 0500)   Exam is stable today, no changes again today.  General - Well nourished, well developed, in no apparent distress.  Ophthalmologic - Fundi not visualized due to eye movement.  Cardiovascular - Regular rate and rhythm.  Mental Status -  Level of arousal and orientation to time, place, and person were intact. Language including expression, naming, repetition, comprehension was assessed and found intact. Fund of Knowledge was assessed and was intact.  Cranial Nerves II - XII - II - Visual field intact OU. III, IV, VI - Extraocular movements intact. V - Facial sensation intact bilaterally. VII - Facial movement intact bilaterally. VIII - Hearing & vestibular intact bilaterally. X - Palate elevates symmetrically. XI - Chin turning & shoulder shrug intact bilaterally. XII - Tongue protrusion intact.  Motor Strength - The patient's strength was normal in all extremities and pronator drift was absent.  Bulk was normal and fasciculations were absent.   Motor Tone - Muscle tone was assessed at the neck and appendages and was normal.  Reflexes - The patient's reflexes were diminished in all extremities and he had no pathological reflexes.  Sensory - Light touch, temperature/pinprick were assessed and were symmetrical.    Coordination - The patient had normal movements in the hands with no ataxia or dysmetria.  Tremor was absent.  Gait and Station - deferred.   ASSESSMENT/PLAN [name] is a 51 y.o. 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 He did not receive IV t-PA due to  being out of the window.   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 However, pt doses have stroke risk factor including DM, obesity, HLD, smoking  Resultant  BLE weakness near baseline now  CT head low-density L corpus callosum   MRI  left greater than right corpus callosum infarct. Small L ACA/watershed infarct. Punctate R parietal infarct.  MRA  L ACA occlusion and A2. Diminished flow versus occlusion R A2.  Carotid Doppler  unremarkable  2D Echo  EF 45-50%  LE venous Doppler pending  TEE to look for embolic source. Have requested arrangement with [place] for Monday. (I have made patient NPO after midnight).   LDL UTC, high TG  HgbA1c 7.1  Lovenox 40 mg sq daily for VTE prophylaxis Diet Carb Modified Fluid consistency: Thin; Room service appropriate? Yes Diet NPO time specified  No antithrombotic prior to admission, now on aspirin 325 mg daily.   All Patient counseled to be compliant with his antithrombotic  medications  Ongoing aggressive stroke risk factor management  Therapy recommendations:  OP PT  Disposition:  pending   Hypertension  Elevated  On Coreg Permissive hypertension (OK if < 220/120) but gradually normalize in 5-7 days Long-term BP goal normotensive  Hyperlipidemia  Home meds:  Zocor 20, resumed in hospital  LDL UTC, goal < 70  Add zetia due to high TG  Continue statin and Zetia at discharge  Diabetes  HgbA1c pending, 7.1 goal < 6.5  Not in good control  SSI  Tobacco abuse  Current smoker  Smoking cessation counseling provided  Pt is willing to quit  Other Stroke Risk Factors  Obesity, Body mass index is 36.55 kg/m., recommend weight loss, diet and exercise as appropriate   Hx of 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 Today, patient BLE much improved, near baseline, clinically does not consistent with GBS, but more consistent with bilateral ACA infarct. Will hold off LP.  Hospital day # 3    Personally examined patient and images, and have participated in and made any corrections needed to history, physical, neuro exam,assessment and plan as stated above.  I have personally obtained the history, evaluated lab date, reviewed imaging studies and agree with radiology interpretations.       To contact Stroke Continuity provider, please refer to [email or web address] After hours, contact General Neurology

## 2017-02-04 ENCOUNTER — Inpatient Hospital Stay (HOSPITAL_COMMUNITY): Payer: 59

## 2017-02-04 ENCOUNTER — Encounter (HOSPITAL_COMMUNITY)
Admission: EM | Disposition: A | Discharge status: Home / Self Care | Payer: Self-pay | Source: Home / Self Care | Attending: Internal Medicine

## 2017-02-04 ENCOUNTER — Encounter (HOSPITAL_COMMUNITY): Payer: Self-pay | Admitting: *Deleted

## 2017-02-04 DIAGNOSIS — I340 Nonrheumatic mitral (valve) insufficiency: Secondary | ICD-10-CM

## 2017-02-04 DIAGNOSIS — I6300 Cerebral infarction due to thrombosis of unspecified precerebral artery: Secondary | ICD-10-CM

## 2017-02-04 HISTORY — PX: TEE WITHOUT CARDIOVERSION: SHX5443

## 2017-02-04 LAB — GLUCOSE, CAPILLARY
Glucose-Capillary: 141 mg/dL — ABNORMAL HIGH (ref 65–99)
Glucose-Capillary: 127 mg/dL — ABNORMAL HIGH (ref 65–99)
Glucose-Capillary: 95 mg/dL (ref 65–99)

## 2017-02-04 SURGERY — ECHOCARDIOGRAM, TRANSESOPHAGEAL
Anesthesia: Moderate Sedation

## 2017-02-04 MED ORDER — FENTANYL CITRATE (PF) 100 MCG/2ML IJ SOLN
INTRAMUSCULAR | Status: AC
  Filled 2017-02-04: qty 2

## 2017-02-04 MED ORDER — SIMVASTATIN 20 MG PO TABS: 20.0000 mg | ORAL_TABLET | Freq: Every day | ORAL | 0 refills | Status: DC

## 2017-02-04 MED ORDER — MIDAZOLAM HCL 10 MG/2ML IJ SOLN
INTRAMUSCULAR | Status: DC | PRN
  Administered 2017-02-04 (×2): 2 mg via INTRAVENOUS

## 2017-02-04 MED ORDER — ASPIRIN 325 MG PO TABS: 325.0000 mg | ORAL_TABLET | Freq: Every day | ORAL | 0 refills | Status: DC

## 2017-02-04 MED ORDER — FENTANYL CITRATE (PF) 100 MCG/2ML IJ SOLN
INTRAMUSCULAR | Status: DC | PRN
  Administered 2017-02-04 (×2): 25 ug via INTRAVENOUS

## 2017-02-04 MED ORDER — EZETIMIBE 10 MG PO TABS: 10.0000 mg | ORAL_TABLET | Freq: Every day | ORAL | 0 refills | Status: DC

## 2017-02-04 MED ORDER — BUTAMBEN-TETRACAINE-BENZOCAINE 2-2-14 % EX AERO
INHALATION_SPRAY | CUTANEOUS | Status: DC | PRN
  Administered 2017-02-04: 2 via TOPICAL

## 2017-02-04 MED ORDER — IOPAMIDOL (ISOVUE-370) INJECTION 76%
INTRAVENOUS | Status: AC
  Administered 2017-02-04: 100 mL
  Filled 2017-02-04: qty 100

## 2017-02-04 MED ORDER — HYDRALAZINE HCL 20 MG/ML IJ SOLN
10.0000 mg | Freq: Once | INTRAMUSCULAR | Status: AC
Start: 2017-02-04 — End: 2017-02-04
  Administered 2017-02-04: 10 mg via INTRAVENOUS

## 2017-02-04 MED ORDER — SODIUM CHLORIDE 0.9 % IV SOLN: INTRAVENOUS | Status: DC

## 2017-02-04 MED ORDER — HYDRALAZINE HCL 20 MG/ML IJ SOLN
INTRAMUSCULAR | Status: AC
  Filled 2017-02-04: qty 1

## 2017-02-04 MED ORDER — MIDAZOLAM HCL 5 MG/ML IJ SOLN
INTRAMUSCULAR | Status: AC
  Filled 2017-02-04: qty 2

## 2017-02-04 MED ORDER — DIPHENHYDRAMINE HCL 50 MG/ML IJ SOLN
INTRAMUSCULAR | Status: AC
  Filled 2017-02-04: qty 1

## 2017-02-04 MED ORDER — FUROSEMIDE 40 MG PO TABS: 40.0000 mg | ORAL_TABLET | Freq: Every day | ORAL | 0 refills | Status: DC

## 2017-02-04 MED ORDER — HYDRALAZINE HCL 20 MG/ML IJ SOLN
10.0000 mg | Freq: Once | INTRAMUSCULAR | Status: AC
  Administered 2017-02-04: 10 mg via INTRAVENOUS

## 2017-02-04 NOTE — Discharge Summary (Signed)
Physician Discharge Summary  Jaython Stofflet [id number] DOB: 1966/02/25 DOA: 01/31/2017  PCP: Arnoldo Morale, MD  Admit date: 01/31/2017 Discharge date: 02/04/2017  Admitted From: Home  Disposition:  Home   Recommendations for Outpatient Follow-up:  1. Follow up with PCP in 1-2 weeks 2. Please obtain BMP/CBC in one week 3. Needs to control BP and DM. Needs to follow up with neurology     Discharge Condition: stable.  CODE STATUS: Full code.  Diet recommendation:Carb Modified  Brief/Interim Summary: Patient is a 51 year old male with history of combined systolic and diastolic CHF, EF 99991111, hypertension, obesity, diabetes mellitus, hyperlipidemiapresented to ED with decreased sensation and weakness in his bilateral lower legs since Tuesday for last 2 days. He also noticed tingling in his both hands for the last 2 days as well. 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 He also noticed tingling in his hands at the same time, which subsequently resolved right lower quadrant chest pain. He continued working. His symptoms did not improve. Today he noticed dysarthria and came to the ER to be evaluated. Otherwise he denied any difficulty swallowing, blurry vision, headache.    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 Additional small left ACA/watershed infarcts. Punctate acute right parietal infarct. 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 LDL unable to be calculated, triglycerides 415. Started statins. zetia  Continue with coreg and resume lasix. Resume hydralazine.  Continue to hold : lisinopril, Aldactone, imdur.  Follow ECHO EF 45 -50 % ,  doppler 1-39 % stenosis ICA .  TEE negative.    Right-sided chest tightness Troponin 1 negative, BNP 15.8 ECHO ef 45 %  -chest pain free.   Chronic combined systolic and diastolic congestive heart failure ([place]), history of ischemic cardiomyopathy Resume lasix, continue with coreg. Resume hydralazine   Mild acute onCKD (chronic kidney disease) stage 2, GFR 60-89 ml/min. Cr baseline 1.4  Monitor on lasix,  Stable.   Diabetes mellitus type II, controlled ([place]); Hb-A1c 7.1 Resume  Glipizide at discharge.  SSI.   Discharge Diagnoses:  Principal Problem:   CVA (cerebral vascular accident) [facility]) Active Problems:   Hypertension   Chronic combined systolic and diastolic congestive heart failure (HCC)   Morbid obesity (HCC)   CKD (chronic kidney disease) stage 2, GFR 60-89 ml/min   Diabetes mellitus type II, controlled ([place])   OSA (obstructive sleep apnea)   Mixed hyperlipidemia    Discharge Instructions  Discharge Instructions    Diet - low sodium heart healthy    Complete by:  As directed    Increase activity slowly    Complete by:  As directed      Allergies as of 02/04/2017   No Known Allergies     Medication List    STOP taking these medications   HYDROcodone-acetaminophen 5-325 MG tablet Commonly known as:  NORCO/VICODIN   isosorbide mononitrate 30 MG 24 hr tablet Commonly known as:  IMDUR   lisinopril 5 MG tablet Commonly known as:  PRINIVIL,ZESTRIL   methocarbamol 500 MG tablet Commonly known as:  ROBAXIN   naproxen 500 MG tablet Commonly known as:  NAPROSYN   Potassium Chloride ER 20 MEQ Tbcr   spironolactone 25 MG  tablet Commonly known as:  ALDACTONE     TAKE these medications   acetaminophen 500 MG tablet Commonly known as:  TYLENOL Take 500 mg by mouth every 6 (six) hours as needed for mild pain.   aspirin 325 MG tablet Take 1 tablet (325 mg total) by mouth daily.   carvedilol 6.25 MG tablet Commonly known as:  COREG Take 1  tablet (6.25 mg total) by mouth 2 (two) times daily with a meal.   ezetimibe 10 MG tablet Commonly known as:  ZETIA Take 1 tablet (10 mg total) by mouth daily.   furosemide 40 MG tablet Commonly known as:  LASIX Take 1 tablet (40 mg total) by mouth daily. What changed:  when to take this   glipiZIDE 5 MG tablet Commonly known as:  GLUCOTROL Take 1 tablet (5 mg total) by mouth 2 (two) times daily before a meal.   glucose blood test strip Commonly known as:  ONE TOUCH ULTRA TEST Use as instructed   hydrALAZINE 10 MG tablet Commonly known as:  APRESOLINE Take 1 tablet (10 mg total) by mouth 3 (three) times daily.   simvastatin 20 MG tablet Commonly known as:  ZOCOR Take 1 tablet (20 mg total) by mouth at bedtime.      Follow-up Information    Arnoldo Morale, MD Follow up in 1 week(s).   Specialty:  Family Medicine Contact information: [place] [address] [phone]        Xu,Jindong, MD Follow up in 1 week(s).   Specialty:  Neurology Contact information: [address] [place] [address] [phone]          No Known Allergies  Consultations:  Neurology    Procedures/Studies: Dg Chest 2 View  Result Date: 01/31/2017 CLINICAL DATA:  Chest pain . EXAM: CHEST  2 VIEW COMPARISON:  12/27/2015 . FINDINGS: Mediastinum hilar structures normal. Cardiomegaly with normal pulmonary vascularity. No focal infiltrate. No pleural effusion or pneumothorax. No acute bony abnormality . IMPRESSION: 1. Mild cardiomegaly, no pulmonary venous could. 2. No acute pulmonary disease. Electronically Signed   By: Marcello Moores  Register   On: 01/31/2017 10:11   Ct Head Wo Contrast  Result Date: 01/31/2017 CLINICAL DATA:  Slurred speech. Left grip weakness. Symptoms began yesterday. EXAM: CT HEAD WITHOUT CONTRAST TECHNIQUE: Contiguous axial images were obtained from the base of the skull through the vertex without intravenous contrast. COMPARISON:   None. FINDINGS: Brain: Brainstem and cerebellum are normal. No abnormality seen in the right cerebral hemisphere. On the left, there is abnormal low density in the corpus callosum and pericallosal white matter which could represent ACA territory infarction. No evidence of hemorrhage. No hydrocephalus or extra-axial collection. Vascular: No abnormal vascular finding.  No hyperdense vessels. Skull: Normal Sinuses/Orbits: Clear except for a small amount of mucus in the sphenoid sinus, probably not significant. Other: None significant IMPRESSION: Abnormal low-density in the left corpus callosum and pericallosal deep white matter that could represent recent left ACA infarction. Electronically Signed   By: Nelson Chimes M.D.   On: 01/31/2017 10:31   Mr Angiogram Head Wo Contrast  Result Date: 01/31/2017 CLINICAL DATA:  Weakness and tingling in both legs. Tingling in the hands. Dysarthria. EXAM: MRI HEAD WITHOUT CONTRAST MRA HEAD WITHOUT CONTRAST TECHNIQUE: Multiplanar, multiecho pulse sequences of the brain and surrounding structures were obtained without intravenous contrast. Angiographic images of the head were obtained using MRA technique without contrast. COMPARISON:  Head CT 01/31/2017 FINDINGS: MRI HEAD FINDINGS Some  sequences are motion degraded, particularly the sagittal T1 sequence. Brain: There is an acute infarct involving the genu and anterior body of the corpus callosum on the left with diffuse bilateral involvement of the mid and posterior body. Small foci of acute infarction are present in the adjacent left cingulate gyrus and in the left frontal white matter predominantly at the level of the centrum semiovale. There are punctate acute infarcts in both parietal lobes. There is no evidence of intracranial hemorrhage, mass, midline shift, or extra-axial fluid collection. The ventricles and sulci are normal in size. Small foci of T2 hyperintensity in the cerebral white matter separate from the areas of  acute infarction, most notable in the right parietal lobe, are nonspecific but compatible with minimal chronic small vessel ischemic disease. Vascular: Major intracranial vascular flow voids are preserved. Skull and upper cervical spine: No focal marrow lesion identified. Sinuses/Orbits: Prior right cataract extraction. Small volume secretions in the sphenoid sinuses. Other: None. MRA HEAD FINDINGS The study is mildly motion degraded. The visualized distal vertebral arteries are patent with the right being dominant. PICA is appear patent, although the left PICA origin was not imaged. SCA origins are patent. Basilar artery is widely patent. There is a small left posterior communicating artery. PCAs are patent without evidence of significant proximal stenosis. The internal carotid arteries are widely patent from skullbase to carotid termini. The MCAs are patent without evidence of major branch occlusion or significant proximal stenosis. The A1 and proximal A2 segments are patent bilaterally without evidence of significant stenosis. There is mid left A2 occlusion. There is also other occlusion or severely diminished flow in the more distal right A2 segment, with evaluation limited by motion artifact and the vessels small size. No intracranial aneurysm is identified. IMPRESSION: 1. 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 2. Additional small left ACA/watershed infarcts. 3. Punctate acute right parietal infarct. 4. 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 5. Diminished flow versus occlusion of the distal right A2 segment. Electronically Signed   By: Logan Bores M.D.   On: 01/31/2017 15:12   Mr Brain Wo Contrast  Result Date: 01/31/2017 CLINICAL DATA:  Weakness and tingling in both legs. Tingling in the hands. Dysarthria. EXAM: MRI HEAD WITHOUT CONTRAST MRA HEAD WITHOUT CONTRAST TECHNIQUE: Multiplanar, multiecho pulse sequences of the brain and surrounding structures  were obtained without intravenous contrast. Angiographic images of the head were obtained using MRA technique without contrast. COMPARISON:  Head CT 01/31/2017 FINDINGS: MRI HEAD FINDINGS Some sequences are motion degraded, particularly the sagittal T1 sequence. Brain: There is an acute infarct involving the genu and anterior body of the corpus callosum on the left with diffuse bilateral involvement of the mid and posterior body. Small foci of acute infarction are present in the adjacent left cingulate gyrus and in the left frontal white matter predominantly at the level of the centrum semiovale. There are punctate acute infarcts in both parietal lobes. There is no evidence of intracranial hemorrhage, mass, midline shift, or extra-axial fluid collection. The ventricles and sulci are normal in size. Small foci of T2 hyperintensity in the cerebral white matter separate from the areas of acute infarction, most notable in the right parietal lobe, are nonspecific but compatible with minimal chronic small vessel ischemic disease. Vascular: Major intracranial vascular flow voids are preserved. Skull and upper cervical spine: No focal marrow lesion identified. Sinuses/Orbits: Prior right cataract extraction. Small volume secretions in the sphenoid sinuses. Other: None.  MRA HEAD FINDINGS The study is mildly motion degraded. The visualized distal vertebral arteries are patent with the right being dominant. PICA is appear patent, although the left PICA origin was not imaged. SCA origins are patent. Basilar artery is widely patent. There is a small left posterior communicating artery. PCAs are patent without evidence of significant proximal stenosis. The internal carotid arteries are widely patent from skullbase to carotid termini. The MCAs are patent without evidence of major branch occlusion or significant proximal stenosis. The A1 and proximal A2 segments are patent bilaterally without evidence of significant stenosis. There  is mid left A2 occlusion. There is also other occlusion or severely diminished flow in the more distal right A2 segment, with evaluation limited by motion artifact and the vessels small size. No intracranial aneurysm is identified. IMPRESSION: 1. 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 2. Additional small left ACA/watershed infarcts. 3. Punctate acute right parietal infarct. 4. 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 5. Diminished flow versus occlusion of the distal right A2 segment. Electronically Signed   By: Logan Bores M.D.   On: 01/31/2017 15:12   Ct Angio Chest/abd/pel For Dissection W And/or W/wo  Result Date: 02/04/2017 CLINICAL DATA:  Chest pain and hypertension following TEE, evaluate for aortic dissection EXAM: CT ANGIOGRAPHY CHEST, ABDOMEN AND PELVIS TECHNIQUE: Multidetector CT imaging through the chest, abdomen and pelvis was performed using the standard protocol during bolus administration of intravenous contrast. Multiplanar reconstructed images and MIPs were obtained and reviewed to evaluate the vascular anatomy. CONTRAST:  100 mL Isovue 370 IV COMPARISON:  Chest radiographs dated 01/31/2017 FINDINGS: CTA CHEST FINDINGS Cardiovascular: On unenhanced CT, there is no evidence of intramural hematoma. Preferential opacification of the thoracic aorta. No evidence of thoracic aortic aneurysm or dissection. The heart is top-normal in size.  No pericardial effusion. Mediastinum/Nodes: No suspicious mediastinal lymphadenopathy. Visualized thyroid is unremarkable. Lungs/Pleura: Evaluation of the lung parenchyma is mildly constrained by respiratory motion. No suspicious pulmonary nodules. No focal consolidation. No pleural effusion or pneumothorax. Musculoskeletal: Very mild degenerative changes of the mid thoracic spine. Review of the MIP images confirms the above findings. CTA ABDOMEN AND PELVIS FINDINGS VASCULAR Aorta: Patent. No evidence of abdominal  aortic aneurysm or dissection. Mild atherosclerotic calcifications. Celiac: Patent. SMA: Patent. Renals: Bilateral renal arteries are patent. IMA: Patent. Inflow: Patent.  Mild atherosclerotic calcifications. Veins: Unremarkable. Review of the MIP images confirms the above findings. NON-VASCULAR Hepatobiliary: Liver is grossly unremarkable. Mild hepatic steatosis with focal fatty sparing along the gallbladder fossa. Gallbladder is unremarkable. No intrahepatic or extrahepatic ductal dilatation. Pancreas: Within normal limits. Spleen: Within normal limits. Adrenals/Urinary Tract: Adrenal glands are within normal limits. Kidneys are within normal limits.  No hydronephrosis. Bladder is within normal limits. Stomach/Bowel: Stomach is within normal limits. No evidence of bowel obstruction. Normal appendix (series 6/ image 216). Lymphatic: No suspicious abdominopelvic lymphadenopathy. Reproductive: Prostate is unremarkable. Other: No abdominopelvic ascites. Small fat containing bilateral inguinal hernias (series 6/image 301). Musculoskeletal: Mild degenerative changes of the lumbar spine. Review of the MIP images confirms the above findings. IMPRESSION: No evidence of thoracoabdominal aortic aneurysm or dissection. No evidence of acute cardiopulmonary disease. Mild hepatic steatosis. Electronically Signed   By: Julian Hy M.D.   On: 02/04/2017 16:17       Subjective: He is stable, no changes in neuro deficit   Discharge Exam: Vitals:   02/04/17 1605 02/04/17 1622  BP: (!) 147/78 (!) 147/68  Pulse:  64 61  Resp: 17 18  Temp:  97.8 F (36.6 C)   Vitals:   02/04/17 1545 02/04/17 1555 02/04/17 1605 02/04/17 1622  BP: (!) 142/89 (!) 159/87 (!) 147/78 (!) 147/68  Pulse: 67 60 64 61  Resp: (!) 22 (!) 22 17 18   Temp:    97.8 F (36.6 C)  TempSrc:    Oral  SpO2: 100% 100% 100% 100%  Weight:      Height:        General: Pt is alert, awake, not in acute distress Cardiovascular: RRR, S1/S2 +, no  rubs, no gallops Respiratory: CTA bilaterally, no wheezing, no rhonchi Abdominal: Soft, NT, ND, bowel sounds + Extremities: no edema, no cyanosis    The results of significant diagnostics from this hospitalization (including imaging, microbiology, ancillary and laboratory) are listed below for reference.     Microbiology: No results found for this or any previous visit (from the past 240 hour(s)).   Labs: BNP (last 3 results)  Recent Labs  01/31/17 0924  BNP [id number]   Basic Metabolic Panel:  Recent Labs Lab 01/31/17 0918 01/31/17 0935  NA 139 140  K 4.0 3.9  CL 105 104  CO2 23  --   GLUCOSE 143* 146*  BUN 11 13  CREATININE 1.39* 1.40*  CALCIUM 9.5  --    Liver Function Tests:  Recent Labs Lab 01/31/17 0918  AST 21  ALT 22  ALKPHOS 61  BILITOT 0.9  PROT 7.1  ALBUMIN 4.1   No results for input(s): LIPASE, AMYLASE in the last 168 hours. No results for input(s): AMMONIA in the last 168 hours. CBC:  Recent Labs Lab 01/31/17 0918 01/31/17 0935  WBC 5.0  --   NEUTROABS 2.6  --   HGB 15.8 16.3  HCT 46.4 48.0  MCV 87.2  --   PLT 249  --    Cardiac Enzymes: No results for input(s): CKTOTAL, CKMB, CKMBINDEX, TROPONINI in the last 168 hours. BNP: Invalid input(s): POCBNP CBG:  Recent Labs Lab 02/03/17 1631 02/03/17 2136 02/04/17 0602 02/04/17 1111 02/04/17 1619  GLUCAP 126* 123* 141* 127* 95   D-Dimer No results for input(s): DDIMER in the last 72 hours. Hgb A1c No results for input(s): HGBA1C in the last 72 hours. Lipid Profile No results for input(s): CHOL, HDL, LDLCALC, TRIG, CHOLHDL, LDLDIRECT in the last 72 hours. Thyroid function studies No results for input(s): TSH, T4TOTAL, T3FREE, THYROIDAB in the last 72 hours.  Invalid input(s): FREET3 Anemia work up No results for input(s): VITAMINB12, FOLATE, FERRITIN, TIBC, IRON, RETICCTPCT in the last 72 hours. Urinalysis No results found for: COLORURINE, APPEARANCEUR, LABSPEC, [place],  GLUCOSEU, HGBUR, BILIRUBINUR, KETONESUR, PROTEINUR, UROBILINOGEN, NITRITE, LEUKOCYTESUR Sepsis Labs Invalid input(s): PROCALCITONIN,  WBC,  LACTICIDVEN Microbiology No results found for this or any previous visit (from the past 240 hour(s)).   Time coordinating discharge: Over 30 minutes  SIGNED:   Elmarie Shiley, MD  [facility] 02/04/2017, 4:32 PM Pager   If 7PM-7AM, please contact night-coverage [email or web address] Password [email or web address]

## 2017-02-04 NOTE — Progress Notes (Signed)
Per Dr. Meda Coffee, CT negative for aortic dissection he may return to unit.

## 2017-02-04 NOTE — Progress Notes (Signed)
Patient discharged home with spouse. Discharge information, prescriptions, and work note given. Patient questions asked and answered. Neuro rehab will contact patient to set up first appointment. This information added to After Visit Summary.  Transported from unit via wheelchair with nurse tech. Wendee Copp

## 2017-02-04 NOTE — Care Management Note (Signed)
Case Management Note  Patient Details  Name: Matthew [name] MRN: [id number] Date of Birth: 04-03-1966  Subjective/Objective:                    Action/Plan: Pt discharging home with self care. CM consulted for outpatient therapy. CM met with the patient and he is interested in attending the [facility]. Orders placed in EPIC and information on the AVS.   Expected Discharge Date:  02/04/17               Expected Discharge Plan:  Home/Self Care  In-House Referral:     Discharge planning Services  CM Consult  Post Acute Care Choice:    Choice offered to:     DME Arranged:    DME Agency:     HH Arranged:    HH Agency:     Status of Service:  Completed, signed off  If discussed at [employer] of Stay Meetings, dates discussed:    Additional Comments:  Pollie Friar, RN 02/04/2017, 4:42 PM

## 2017-02-04 NOTE — Progress Notes (Signed)
Patient upset that he is unable to get TEE this morning. Complains that he has been admitted since Thursday and should not have to wait anymore. Patient's attending physician has attempted to move up test.  Stroke team notified and will attend patient to either persuade him to stay and have test or plan alternative. Wendee Copp

## 2017-02-04 NOTE — H&P (View-Only) (Signed)
STROKE TEAM PROGRESS NOTE   HISTORY OF PRESENT ILLNESS (per record) Matthew [name] is an 51 y.o. male who noted on Tuesday that he had decreased sensation in bilateral legs and weakness in bilateral legs. This progressed to bilateral leg weakness and tingling in both hands. . Today he noted dysarthria and came to ED to be evaluated. Currently he states his symptoms have not progressed today or resolved. He was last known well 01/29/2017, time unknown. Patient was not administered IV t-PA secondary to being outside of the window. He was admitted for further evaluation and treatment.   SUBJECTIVE (INTERVAL HISTORY) His wife is at bedside. Patient is upset for having to t be  waiting  For TEE till Monday.   Marland Kitchen He wanted to be discharged home and then decided to stay and wait after I spoke to him..    OBJECTIVE Temp:  [97.5 F (36.4 C)-98.6 F (37 C)] 97.8 F (36.6 C) (02/19 1321) Pulse Rate:  [59-90] 67 (02/19 1045) Cardiac Rhythm: Normal sinus rhythm;Sinus bradycardia (02/19 0700) Resp:  [18-26] 26 (02/19 1321) BP: (144-159)/(88-103) 159/103 (02/19 1321) SpO2:  [98 %-100 %] 98 % (02/19 1321) Weight:  [240 lb (108.9 kg)] 240 lb (108.9 kg) (02/19 1321)  CBC:   Recent Labs Lab 01/31/17 0918 01/31/17 0935  WBC 5.0  --   NEUTROABS 2.6  --   HGB 15.8 16.3  HCT 46.4 48.0  MCV 87.2  --   PLT 249  --     Basic Metabolic Panel:   Recent Labs Lab 01/31/17 0918 01/31/17 0935  NA 139 140  K 4.0 3.9  CL 105 104  CO2 23  --   GLUCOSE 143* 146*  BUN 11 13  CREATININE 1.39* 1.40*  CALCIUM 9.5  --     Lipid Panel:     Component Value Date/Time   CHOL 147 02/01/2017 0409   TRIG 415 (H) 02/01/2017 0409   HDL 28 (L) 02/01/2017 0409   CHOLHDL 5.3 02/01/2017 0409   VLDL UNABLE TO CALCULATE IF TRIGLYCERIDE OVER 400 mg/dL 02/01/2017 0409   LDLCALC UNABLE TO CALCULATE IF TRIGLYCERIDE OVER 400 mg/dL 02/01/2017 0409   HgbA1c:  Lab Results  Component Value Date   HGBA1C 7.1 (H)  02/01/2017   Urine Drug Screen:     Component Value Date/Time   LABOPIA NONE DETECTED 03/02/2014 1359   COCAINSCRNUR NONE DETECTED 03/02/2014 1359   LABBENZ NONE DETECTED 03/02/2014 1359   AMPHETMU NONE DETECTED 03/02/2014 1359   THCU NONE DETECTED 03/02/2014 1359   LABBARB NONE DETECTED 03/02/2014 1359      IMAGING I have personally reviewed the radiological images below and agree with the radiology interpretations.  Dg Chest 2 View 01/31/2017 1. Mild cardiomegaly, no pulmonary venous could. 2. No acute pulmonary disease.   Ct Head Wo Contrast 01/31/2017 Abnormal low-density in the left corpus callosum and pericallosal deep white matter that could represent recent left ACA infarction.   Mr Brain 19 Contrast Mr Angiogram Head Wo Contrast 01/31/2017 1. 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 2. Additional small left ACA/watershed infarcts. 3. Punctate acute right parietal infarct. 4. 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 5. Diminished flow versus occlusion of the distal right A2 segment.   TTE - Left ventricle: The cavity size was mildly dilated. Wall   thickness was normal. Systolic function was mildly reduced. The   estimated ejection fraction was in the range of 45% to 50%.  Features are consistent with a pseudonormal left ventricular   filling pattern, with concomitant abnormal relaxation and   increased filling pressure (grade 2 diastolic dysfunction). - Aortic valve: Valve area (VTI): 4.14 cm^2. Valve area (Vmax):   4.18 cm^2. Valve area (Vmean): 3.78 cm^2.  CUS - Findings consistent with a 1-39 percent stenosis involving the right internal carotid artery and the left internal carotid artery. Intimal thickening bilaterally with mild soft plaque in the proximal left internal carotid artery. Bilateral vertebral arteries are patent and antegrade.  LE venous Doppler pending   PHYSICAL EXAM  Temp:  [97.5 F (36.4  C)-98.6 F (37 C)] 97.8 F (36.6 C) (02/19 1321) Pulse Rate:  [59-90] 67 (02/19 1045) Resp:  [18-26] 26 (02/19 1321) BP: (144-159)/(88-103) 159/103 (02/19 1321) SpO2:  [98 %-100 %] 98 % (02/19 1321) Weight:  [240 lb (108.9 kg)] 240 lb (108.9 kg) (02/19 1321)   Exam is stable today, no changes again today.  General - Well nourished, well developed middle aged african [place] male, in no apparent distress.  Ophthalmologic - Fundi not visualized due to eye movement.  Cardiovascular - Regular rate and rhythm.  Mental Status -  Level of arousal and orientation to time, place, and person were intact. Language including expression, naming, repetition, comprehension was assessed and found intact. Fund of Knowledge was assessed and was intact.  Cranial Nerves II - XII - II - Visual field intact OU. III, IV, VI - Extraocular movements intact. V - Facial sensation intact bilaterally. VII - Facial movement intact bilaterally. VIII - Hearing & vestibular intact bilaterally. X - Palate elevates symmetrically. XI - Chin turning & shoulder shrug intact bilaterally. XII - Tongue protrusion intact.  Motor Strength - The patient's strength was normal in all extremities and pronator drift was absent.  Bulk was normal and fasciculations were absent.   Motor Tone - Muscle tone was assessed at the neck and appendages and was normal.  Reflexes - The patient's reflexes were diminished in all extremities and he had no pathological reflexes.  Sensory - Light touch, temperature/pinprick were assessed and were symmetrical.    Coordination - The patient had normal movements in the hands with no ataxia or dysmetria.  Tremor was absent.  Gait and Station - deferred.   ASSESSMENT/PLAN [name] is a 51 y.o. 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 He did not receive IV t-PA due to being out of the window.   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 However, pt doses have stroke risk factor including DM, obesity, HLD, smoking  Resultant  BLE weakness near baseline now  CT head low-density L corpus callosum   MRI  left greater than right corpus callosum infarct. Small L ACA/watershed infarct. Punctate R parietal infarct.  MRA  L ACA occlusion and A2. Diminished flow versus occlusion R A2.  Carotid Doppler  unremarkable  2D Echo  EF 45-50%  LE venous Doppler pending  TEE to look for embolic source. Have requested arrangement with [place] for Monday. (I have made patient NPO after midnight).   LDL UTC, high TG  HgbA1c 7.1  Lovenox 40 mg sq daily for VTE prophylaxis Diet NPO time specified  No antithrombotic prior to admission, now on aspirin 325 mg daily.  All Patient counseled to be compliant with his antithrombotic medications  Ongoing aggressive stroke risk factor management  Therapy recommendations:  OP PT  Disposition:  pending   Hypertension  Elevated  On Coreg Permissive hypertension (OK if < 220/120) but gradually normalize in 5-7 days Long-term BP goal normotensive  Hyperlipidemia  Home meds:  Zocor 20, resumed in hospital  LDL UTC, goal < 70  Add zetia due to high TG  Continue statin and Zetia at discharge  Diabetes  HgbA1c pending, 7.1 goal < 6.5  Not in good control  SSI  Tobacco abuse  Current smoker  Smoking cessation counseling provided  Pt is willing to quit  Other Stroke Risk Factors  Obesity, Body mass index is 36.49 kg/m., recommend weight loss, diet and exercise as appropriate   Hx of 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 Today, patient BLE much improved, near baseline, clinically does not consistent with GBS, but more consistent with bilateral ACA infarct. Will hold off LP.  Hospital day # 4    Personally examined patient and images, and have participated in and made any corrections needed to history, physical, neuro exam,assessment and plan as stated above.  I have personally obtained the history, evaluated lab date, reviewed imaging studies and agree with radiology interpretations. Plan check TEE today and likely DC home after that. The patient was quite upset that he had to wait 3 days to get the TEE done. High spent a lot of time explained to the patient and his wife why getting him on the TEE list and getting it performed at the specific time is beyond the control of  My team. The patient was threatening to leave [place] but calmed down and agreed to stay and get the procedure done. Greater than 50% time during this 35 minute visit was spent on counseling and coordination of care about his stroke, the need for obtaining TEE and answering questions. Discussed with patient, wife and Dr. Tyrell Antonio.     To contact Stroke Continuity provider, please refer to [email or web address] After hours, contact General Neurology

## 2017-02-04 NOTE — Progress Notes (Signed)
  Echocardiogram 2D Echocardiogram has been performed.  Matthew [name] 02/04/2017, 2:29 PM

## 2017-02-04 NOTE — Progress Notes (Signed)
PT Cancellation Note  Patient Details Name: Matthew [name] MRN: [id number] DOB: 1966/11/19   Cancelled Treatment:    Reason Eval/Treat Not Completed: Patient at procedure or test/unavailable (will f/u tomorrow.  )   Cristela Blue 02/04/2017, 4:01 PM Governor Rooks, PTA pager [phone]

## 2017-02-04 NOTE — Progress Notes (Signed)
STROKE TEAM PROGRESS NOTE   HISTORY OF PRESENT ILLNESS (per record) Matthew [name] is an 51 y.o. male who noted on Tuesday that he had decreased sensation in bilateral legs and weakness in bilateral legs. This progressed to bilateral leg weakness and tingling in both hands. . Today he noted dysarthria and came to ED to be evaluated. Currently he states his symptoms have not progressed today or resolved. He was last known well 01/29/2017, time unknown. Patient was not administered IV t-PA secondary to being outside of the window. He was admitted for further evaluation and treatment.   SUBJECTIVE (INTERVAL HISTORY) His wife is at bedside. Patient is upset for having to t be  waiting  For TEE till Monday.   Marland Kitchen He wanted to be discharged home and then decided to stay and wait after I spoke to him..    OBJECTIVE Temp:  [97.5 F (36.4 C)-98.6 F (37 C)] 97.8 F (36.6 C) (02/19 1321) Pulse Rate:  [59-90] 67 (02/19 1045) Cardiac Rhythm: Normal sinus rhythm;Sinus bradycardia (02/19 0700) Resp:  [18-26] 26 (02/19 1321) BP: (144-159)/(88-103) 159/103 (02/19 1321) SpO2:  [98 %-100 %] 98 % (02/19 1321) Weight:  [240 lb (108.9 kg)] 240 lb (108.9 kg) (02/19 1321)  CBC:   Recent Labs Lab 01/31/17 0918 01/31/17 0935  WBC 5.0  --   NEUTROABS 2.6  --   HGB 15.8 16.3  HCT 46.4 48.0  MCV 87.2  --   PLT 249  --     Basic Metabolic Panel:   Recent Labs Lab 01/31/17 0918 01/31/17 0935  NA 139 140  K 4.0 3.9  CL 105 104  CO2 23  --   GLUCOSE 143* 146*  BUN 11 13  CREATININE 1.39* 1.40*  CALCIUM 9.5  --     Lipid Panel:     Component Value Date/Time   CHOL 147 02/01/2017 0409   TRIG 415 (H) 02/01/2017 0409   HDL 28 (L) 02/01/2017 0409   CHOLHDL 5.3 02/01/2017 0409   VLDL UNABLE TO CALCULATE IF TRIGLYCERIDE OVER 400 mg/dL 02/01/2017 0409   LDLCALC UNABLE TO CALCULATE IF TRIGLYCERIDE OVER 400 mg/dL 02/01/2017 0409   HgbA1c:  Lab Results  Component Value Date   HGBA1C 7.1 (H)  02/01/2017   Urine Drug Screen:     Component Value Date/Time   LABOPIA NONE DETECTED 03/02/2014 1359   COCAINSCRNUR NONE DETECTED 03/02/2014 1359   LABBENZ NONE DETECTED 03/02/2014 1359   AMPHETMU NONE DETECTED 03/02/2014 1359   THCU NONE DETECTED 03/02/2014 1359   LABBARB NONE DETECTED 03/02/2014 1359      IMAGING I have personally reviewed the radiological images below and agree with the radiology interpretations.  Dg Chest 2 View 01/31/2017 1. Mild cardiomegaly, no pulmonary venous could. 2. No acute pulmonary disease.   Ct Head Wo Contrast 01/31/2017 Abnormal low-density in the left corpus callosum and pericallosal deep white matter that could represent recent left ACA infarction.   Mr Brain 71 Contrast Mr Angiogram Head Wo Contrast 01/31/2017 1. 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 2. Additional small left ACA/watershed infarcts. 3. Punctate acute right parietal infarct. 4. 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 5. Diminished flow versus occlusion of the distal right A2 segment.   TTE - Left ventricle: The cavity size was mildly dilated. Wall   thickness was normal. Systolic function was mildly reduced. The   estimated ejection fraction was in the range of 45% to 50%.  Features are consistent with a pseudonormal left ventricular   filling pattern, with concomitant abnormal relaxation and   increased filling pressure (grade 2 diastolic dysfunction). - Aortic valve: Valve area (VTI): 4.14 cm^2. Valve area (Vmax):   4.18 cm^2. Valve area (Vmean): 3.78 cm^2.  CUS - Findings consistent with a 1-39 percent stenosis involving the right internal carotid artery and the left internal carotid artery. Intimal thickening bilaterally with mild soft plaque in the proximal left internal carotid artery. Bilateral vertebral arteries are patent and antegrade.  LE venous Doppler pending   PHYSICAL EXAM  Temp:  [97.5 F (36.4  C)-98.6 F (37 C)] 97.8 F (36.6 C) (02/19 1321) Pulse Rate:  [59-90] 67 (02/19 1045) Resp:  [18-26] 26 (02/19 1321) BP: (144-159)/(88-103) 159/103 (02/19 1321) SpO2:  [98 %-100 %] 98 % (02/19 1321) Weight:  [240 lb (108.9 kg)] 240 lb (108.9 kg) (02/19 1321)   Exam is stable today, no changes again today.  General - Well nourished, well developed middle aged african [place] male, in no apparent distress.  Ophthalmologic - Fundi not visualized due to eye movement.  Cardiovascular - Regular rate and rhythm.  Mental Status -  Level of arousal and orientation to time, place, and person were intact. Language including expression, naming, repetition, comprehension was assessed and found intact. Fund of Knowledge was assessed and was intact.  Cranial Nerves II - XII - II - Visual field intact OU. III, IV, VI - Extraocular movements intact. V - Facial sensation intact bilaterally. VII - Facial movement intact bilaterally. VIII - Hearing & vestibular intact bilaterally. X - Palate elevates symmetrically. XI - Chin turning & shoulder shrug intact bilaterally. XII - Tongue protrusion intact.  Motor Strength - The patient's strength was normal in all extremities and pronator drift was absent.  Bulk was normal and fasciculations were absent.   Motor Tone - Muscle tone was assessed at the neck and appendages and was normal.  Reflexes - The patient's reflexes were diminished in all extremities and he had no pathological reflexes.  Sensory - Light touch, temperature/pinprick were assessed and were symmetrical.    Coordination - The patient had normal movements in the hands with no ataxia or dysmetria.  Tremor was absent.  Gait and Station - deferred.   ASSESSMENT/PLAN [name] is a 51 y.o. 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 He did not receive IV t-PA due to being out of the window.   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 However, pt doses have stroke risk factor including DM, obesity, HLD, smoking  Resultant  BLE weakness near baseline now  CT head low-density L corpus callosum   MRI  left greater than right corpus callosum infarct. Small L ACA/watershed infarct. Punctate R parietal infarct.  MRA  L ACA occlusion and A2. Diminished flow versus occlusion R A2.  Carotid Doppler  unremarkable  2D Echo  EF 45-50%  LE venous Doppler pending  TEE to look for embolic source. Have requested arrangement with [place] for Monday. (I have made patient NPO after midnight).   LDL UTC, high TG  HgbA1c 7.1  Lovenox 40 mg sq daily for VTE prophylaxis Diet NPO time specified  No antithrombotic prior to admission, now on aspirin 325 mg daily.  All Patient counseled to be compliant with his antithrombotic medications  Ongoing aggressive stroke risk factor management  Therapy recommendations:  OP PT  Disposition:  pending   Hypertension  Elevated  On Coreg Permissive hypertension (OK if < 220/120) but gradually normalize in 5-7 days Long-term BP goal normotensive  Hyperlipidemia  Home meds:  Zocor 20, resumed in hospital  LDL UTC, goal < 70  Add zetia due to high TG  Continue statin and Zetia at discharge  Diabetes  HgbA1c pending, 7.1 goal < 6.5  Not in good control  SSI  Tobacco abuse  Current smoker  Smoking cessation counseling provided  Pt is willing to quit  Other Stroke Risk Factors  Obesity, Body mass index is 36.49 kg/m., recommend weight loss, diet and exercise as appropriate   Hx of 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 Today, patient BLE much improved, near baseline, clinically does not consistent with GBS, but more consistent with bilateral ACA infarct. Will hold off LP.  Hospital day # 4    Personally examined patient and images, and have participated in and made any corrections needed to history, physical, neuro exam,assessment and plan as stated above.  I have personally obtained the history, evaluated lab date, reviewed imaging studies and agree with radiology interpretations. Plan check TEE today and likely DC home after that. The patient was quite upset that he had to wait 3 days to get the TEE done. High spent a lot of time explained to the patient and his wife why getting him on the TEE list and getting it performed at the specific time is beyond the control of  My team. The patient was threatening to leave [place] but calmed down and agreed to stay and get the procedure done. Greater than 50% time during this 35 minute visit was spent on counseling and coordination of care about his stroke, the need for obtaining TEE and answering questions. Discussed with patient, wife and Dr. Tyrell Antonio.     To contact Stroke Continuity provider, please refer to [email or web address] After hours, contact General Neurology

## 2017-02-04 NOTE — Progress Notes (Signed)
SLP Cancellation Note  Patient Details Name: Matthew [name] MRN: [id number] DOB: 1965/12/27   Cancelled treatment:       Reason Eval/Treat Not Completed: Patient at procedure or test/unavailable   [name] 02/04/2017, 3:46 PM  [name], M.A. CCC-SLP [phone]

## 2017-02-04 NOTE — Interval H&P Note (Signed)
History and Physical Interval Note:  02/04/2017 2:05 PM  Matthew [name]  has presented today for surgery, with the diagnosis of stroke  The various methods of treatment have been discussed with the patient and family. After consideration of risks, benefits and other options for treatment, the patient has consented to  Procedure(s): TRANSESOPHAGEAL ECHOCARDIOGRAM (TEE) (N/A) as a surgical intervention .  The patient's history has been reviewed, patient examined, no change in status, stable for surgery.  I have reviewed the patient's chart and labs.  Questions were answered to the patient's satisfaction.     [name]

## 2017-02-04 NOTE — Progress Notes (Signed)
BP 186/118, verbal order received from Dr. Meda Coffee for Hydralazine 10mg  IV x1 now. Given per [facility]. Patient transported to CT on monitor with endo RN.

## 2017-02-04 NOTE — Progress Notes (Signed)
Patient in CT with endo RN at bedside continuously monitoring. BP responded to IV hydralazine. Vitals signs stable during this time.

## 2017-02-04 NOTE — CV Procedure (Addendum)
     Transesophageal Echocardiogram Note  Matthew [name] [id number] 11-Jun-1966  Procedure: Transesophageal Echocardiogram Indications: CVA  Procedure Details Consent: Obtained Time Out: Verified patient identification, verified procedure, site/side was marked, verified correct patient position, special equipment/implants available, Radiology Safety Procedures followed,  medications/allergies/relevent history reviewed, required imaging and test results available.  Performed  Medications: Fentanyl: 50 mcg Versed: 4 mg  During this procedure the patient is administered a total of Versed 4 mg mg and Fentanyl 50 mcg mg to achieve and maintain moderate conscious sedation.  The patient's heart rate, blood pressure, and oxygen saturation are monitored continuously during the procedure. The period of conscious sedation is 30 minutes, of which I was present face-to-face 100% of this time.  Left Ventrical:  LVEF 40-45% with diffuse hypokinesis  Mitral Valve: Mild MR  Aortic Valve: Normal  Tricuspid Valve: Mild TR  Pulmonic Valve: Mild PR  Left Atrium/ Left atrial appendage: No thrombus, normal velocities.   Atrial septum: No ASD/PFO.   Aorta: Normal size, there is a linear structure in the ascending aorta that possibly represents an artifact, however an aortic dissection can't be excluded. A stat CT chest to rule out aortic dissection is being ordered.   The results were discussed with Dr Matthew [name] who agrees.    Complications: No apparent complications Patient did tolerate procedure well.  Matthew Dawley, MD, [facility] 02/04/2017, 2:37 PM

## 2017-02-11 ENCOUNTER — Ambulatory Visit: Payer: 59

## 2017-02-11 ENCOUNTER — Encounter: Payer: Self-pay | Admitting: Rehabilitation

## 2017-02-11 ENCOUNTER — Ambulatory Visit: Payer: 59 | Attending: Internal Medicine | Admitting: Rehabilitation

## 2017-02-11 DIAGNOSIS — R471 Dysarthria and anarthria: Secondary | ICD-10-CM | POA: Diagnosis present

## 2017-02-11 DIAGNOSIS — M6281 Muscle weakness (generalized): Secondary | ICD-10-CM | POA: Diagnosis present

## 2017-02-11 DIAGNOSIS — R2681 Unsteadiness on feet: Secondary | ICD-10-CM | POA: Insufficient documentation

## 2017-02-11 DIAGNOSIS — I69390 Apraxia following cerebral infarction: Secondary | ICD-10-CM | POA: Diagnosis present

## 2017-02-11 DIAGNOSIS — I69851 Hemiplegia and hemiparesis following other cerebrovascular disease affecting right dominant side: Secondary | ICD-10-CM

## 2017-02-11 DIAGNOSIS — R2689 Other abnormalities of gait and mobility: Secondary | ICD-10-CM | POA: Diagnosis present

## 2017-02-11 NOTE — Therapy (Signed)
[facility] [address] [place], [place], [address] Phone: [phone]   Fax:  [phone]  Speech Language Pathology Evaluation  Patient Details  Name: Matthew [name] MRN: [id number] Date of Birth: [birth year] Referring Provider: Jeanmarie Hubert, MD  Encounter Date: 02/11/2017      End of Session - 02/11/17 1444    Visit Number 1   Number of Visits 9   Date for SLP Re-Evaluation 04/19/17   SLP Start Time 0850   SLP Stop Time  0931   SLP Time Calculation (min) 41 min   Activity Tolerance Patient tolerated treatment well      Past Medical History:  Diagnosis Date  . Acute combined systolic and diastolic CHF, NYHA class 3 ([place])    a. 02/2014 Echo: EF 25-30%.  . Cardiomyopathy ([place])    a. 02/2014 Echo: EF 25-30%, sev glob HK with inferolat HK->AK, mod conc LVH, Gr 2 DD, Mild MR, sev dil LA.  Marland Kitchen Hypertension   . Morbid obesity ([place])   . Tobacco abuse     Past Surgical History:  Procedure Laterality Date  . CATARACT EXTRACTION Right   . LEFT HEART CATHETERIZATION WITH CORONARY ANGIOGRAM N/A 03/04/2014   Procedure: LEFT HEART CATHETERIZATION WITH CORONARY ANGIOGRAM;  Surgeon: Burnell Blanks, MD;  Location: [facility] CATH LAB;  Service: Cardiovascular;  Laterality: N/A;  . TEE WITHOUT CARDIOVERSION N/A 02/04/2017   Procedure: TRANSESOPHAGEAL ECHOCARDIOGRAM (TEE);  Surgeon: Dorothy Spark, MD;  Location: [facility];  Service: Cardiovascular;  Laterality: N/A;    There were no vitals filed for this visit.      Subjective Assessment - 02/11/17 1441    Subjective Pt tells SLP he had slurred speech on 01-29-17 along with rt sided weakness but worked entire day.   Currently in Pain? No/denies            SLP Evaluation [facility] - 02/11/17 [id number]      SLP Visit Information   SLP Received On 02/11/17   Referring Provider Jeanmarie Hubert, MD   Onset Date 01-29-17   Medical Diagnosis CVA     General Information   HPI  Pt is a 51 y/o male who presented to ED 01-31-17 with BLE weakness, numbness in hands, and speech difficulty. MRI revealed acute infarct in the corpus callosum, additional small L ACA/watershed infarcts, and punctate acute E parietal infarct. Pt presented to ST in hospital with cognitive-communicative and speech deficits.      Prior Functional Status   Cognitive/Linguistic Baseline Within functional limits   Type of Home House    Lives With Spouse   Vocation Full time employment  Company secretary   Overall Cognitive Status Within Functional Limits for tasks assessed     Auditory Comprehension   Overall Auditory Comprehension Appears within functional limits for tasks assessed     Verbal Expression   Overall Verbal Expression Appears within functional limits for tasks assessed     Oral Motor/Sensory Function   Overall Oral Motor/Sensory Function Impaired   Labial ROM Reduced right   Labial Symmetry Within Functional Limits   Labial Strength Reduced   Labial Coordination Reduced   Lingual ROM Reduced right;Reduced left   Lingual Symmetry Abnormal symmetry right   Lingual Strength Reduced  rt > lt   Lingual Coordination WFL   Facial ROM Within Functional Limits   Facial Symmetry Within Functional Limits   Velum Within Functional Limits     Motor Speech  Overall Motor Speech Impaired   Respiration Within functional limits   Phonation Normal   Resonance Within functional limits   Articulation Impaired   Level of Impairment Sentence   Intelligibility Intelligible   Motor Planning Impaired   Level of Impairment Sentence   Motor Speech Errors Aware;Inconsistent   Effective Techniques Slow rate;Over-articulate  Speech more precise using strategies in conv.;min cues occas                         SLP Education - 02/11/17 1444    Education provided Yes   Education Details HEP for dysarthria, results of eval and likely goals   Person(s)  Educated Patient   Methods Explanation;Verbal cues;Handout   Comprehension Verbalized understanding;Returned demonstration;Verbal cues required;Need further instruction          SLP Short Term Goals - 02/11/17 1540      SLP SHORT TERM GOAL #1   Title pt will complete dysarthria HEP with rare min A over two sessions   Time 4   Period --  visits   Status New     SLP SHORT TERM GOAL #2   Title pt will incorporate compensatory strategies into 18/20 sentences   Time 4   Period --  visits   Status New     SLP SHORT TERM GOAL #3   Title pt will demo compensatory strategies for fluency/apraxia in 5 minutes simple conversation 75% of the time   Time 4   Period --  visits   Status New          SLP Long Term Goals - 02/11/17 1543      SLP LONG TERM GOAL #1   Title pt will complete HEP for dysarhtria with modified independence   Time 8   Period --  visits   Status New     SLP LONG TERM GOAL #2   Title pt will use compensatory strategies for apraxia/fluency in 5 minutes mod complex conversation 75% of the time   Time 8   Period --  visits   Status New          Plan - 02/11/17 1445    Clinical Impression Statement Pt presents today to [facility] with suspected mild apraxia of speech including dysfluency, and mild dysarthria including rushes of speech which decr pt's overall intelligibility. Slowed rate and overarticulation improved pt's speech clarity in conversation. Pt's cognition appears to be resolved but SLP may need to eval later in therapy course. Pt would like to be seen x1/week due to high copay. Skilled ST is appropriate for improving pt's verbal communication to approximate PLOF.   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 Mixed hyperlipidemia   . CVA (cerebral vascular accident) ([place]) 01/31/2017  . Erectile dysfunction 07/30/2016  . OSA (obstructive sleep apnea) 05/10/2015  . CKD (chronic kidney disease) stage 2, GFR 60-89 ml/min 03/10/2014  . Diabetes mellitus type II, controlled ([place]) 03/10/2014  . Chronic combined systolic  and diastolic congestive heart failure ([place]) 03/03/2014  . Smoker   . Morbid obesity ([place])   . Hypertension 03/02/2014    [facility] ,[place], [place]  02/11/2017, 3:45 PM  [facility] [address] [place], [place], [address] Phone: [phone]   Fax:  [phone]  Name: Jamarri Doring MRN: [id number] Date of Birth: June 26, 1966

## 2017-02-11 NOTE — Therapy (Signed)
[facility] [address] [place], [place], [address] Phone: [phone]   Fax:  [phone]  Physical Therapy Evaluation  Patient Details  Name: Matthew [name] MRN: [id number] Date of Birth: 1966-01-14 Referring Provider: Arnoldo Morale, MD  Encounter Date: 02/11/2017      PT End of Session - 02/11/17 1036    Visit Number 1   Number of Visits 9   Date for PT Re-Evaluation 04/12/17   Authorization Type [employer] 30 visit limit per discipline   Authorization - Visit Number 1   Authorization - Number of Visits 30   PT Start Time 0809  pt late to session   PT Stop Time 0845   PT Time Calculation (min) 36 min   Activity Tolerance Patient tolerated treatment well   Behavior During Therapy [facility] for tasks assessed/performed      Past Medical History:  Diagnosis Date  . Acute combined systolic and diastolic CHF, NYHA class 3 ([place])    a. 02/2014 Echo: EF 25-30%.  . Cardiomyopathy ([place])    a. 02/2014 Echo: EF 25-30%, sev glob HK with inferolat HK->AK, mod conc LVH, Gr 2 DD, Mild MR, sev dil LA.  Marland Kitchen Hypertension   . Morbid obesity ([place])   . Tobacco abuse     Past Surgical History:  Procedure Laterality Date  . CATARACT EXTRACTION Right   . LEFT HEART CATHETERIZATION WITH CORONARY ANGIOGRAM N/A 03/04/2014   Procedure: LEFT HEART CATHETERIZATION WITH CORONARY ANGIOGRAM;  Surgeon: Burnell Blanks, MD;  Location: [facility] CATH LAB;  Service: Cardiovascular;  Laterality: N/A;  . TEE WITHOUT CARDIOVERSION N/A 02/04/2017   Procedure: TRANSESOPHAGEAL ECHOCARDIOGRAM (TEE);  Surgeon: Dorothy Spark, MD;  Location: [facility];  Service: Cardiovascular;  Laterality: N/A;    There were no vitals filed for this visit.       Subjective Assessment - 02/11/17 0815    Subjective I had a stroke on both sides of my brain.  I don't know what caused it, but they think it was a combination of my blood pressure and cholestrerol.  Pt reports  feeling dizzy and off balance this morning.  BP was 158/105 upon getting to treatment room.     Limitations House hold activities;Walking   Patient Stated Goals "I want to get my speech and my balance back like it was."    Currently in Pain? No/denies            [facility] PT Assessment - 02/11/17 0001      Assessment   Medical Diagnosis CVA    Referring Provider Arnoldo Morale, MD   Onset Date/Surgical Date 01/31/17   Prior Therapy acute     Precautions   Precautions Fall     Restrictions   Weight Bearing Restrictions No     Balance Screen   Has the patient fallen in the past 6 months No   Has the patient had a decrease in activity level because of a fear of falling?  No   Is the patient reluctant to leave their home because of a fear of falling?  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 (on feet, lifts up to 50 lbs, sometimes climbs ladder, step stools)   Leisure [place], watches sports     Cognition   Overall Cognitive Status Impaired/Different from baseline   Area of Impairment --  describes hard to get words out     Sensation   Light Touch Impaired Detail   Light Touch Impaired Details Impaired LUE;Impaired RLE;Impaired RUE;Impaired LLE  some numbness/tingling in hands>feet     Coordination   Gross Motor Movements are Fluid and Coordinated No   Fine Motor Movements are Fluid and Coordinated No   Coordination and  Movement Description Note decreased coordination in BLEs, difficult to discern whether strength or not   Heel Shin Test decreased excursion     ROM / Strength   AROM / PROM / Strength Strength     Strength   Overall Strength Deficits   Overall Strength Comments R hip flex 3-/5, R knee ext 4/5, R knee flex 3/5, R ankle DF 4/5, R ankle PF 3/5, L hip flex 4/5, all others on L WFL     Transfers   Transfers Sit to Stand;Stand to Sit   Sit to Stand 7: Independent   Stand to Sit 7: Independent     Ambulation/Gait   Ambulation/Gait Yes   Ambulation/Gait Assistance 5: Supervision   Ambulation Distance (Feet) 115 Feet   Assistive device None   Gait Pattern Step-through pattern;Decreased stride length;Lateral hip instability;Wide base of support   Ambulation Surface Level;Indoor   Gait velocity 3.69 ft/sec   Stairs Yes   Stairs Assistance 6: Modified independent (Device/Increase time)   Stair Management Technique Two rails;Alternating pattern;Forwards  heavy reliance on UEs   Number of Stairs 4   Height of Stairs 6     Balance   Balance Assessed Yes     High Level Balance   High Level Balance Comments Briefly assessed tandem stance and note that when LLE placed in front, this was easier, and then RLE in front and unable to hold for longer than 2-3 secs without assist; feet together EC x 10 secs with moderate sway, SLS on each leg x 2-3 secs only without needing support.                           PT Education - 02/11/17 1036    Education provided Yes   Education Details evaluation of findings, POC, goals   Person(s) Educated Patient   Methods Explanation   Comprehension Verbalized understanding          PT Short Term Goals - 02/11/17 1051      PT SHORT TERM GOAL #1   Title Pt will initiate HEP in order to indicate improved functional mobility and improved balance.  (Target Date: 03/11/17)   Time 4   Period Weeks   Status New     PT SHORT TERM GOAL #2    Title Will assess 6MWT and improve distance by 150' in order to indicate improved functional endurance.     Time 4   Period Weeks   Status New     PT SHORT TERM GOAL #3   Title Will assess FGA and improve score by 2 points from baseline in order  to indicate improved balance and decreased fall risk.     Time 4   Period Weeks   Status New     PT SHORT TERM GOAL #4   Title Pt will ambulate 500' over unlevel paved surfaces (including ramp and curb) at mod I level in order to indicate safe return to community.     Time 4   Period Weeks   Status New           PT Long Term Goals - 02/11/17 1054      PT LONG TERM GOAL #1   Title Pt will be independent with HEP in order to indicate improved functional endurance and balance.  (Target Date: 04/08/17)   Time 8   Period Weeks   Status New     PT LONG TERM GOAL #2   Title Pt will improve 6MWT by 300' from baseline in order to indicate improved functional endurance.     Time 8   Period Weeks   Status New     PT LONG TERM GOAL #3   Title Pt will improve FGA by 4 points from baseline in order to indicate decreased fall risk.    Time 8   Period Weeks   Status New     PT LONG TERM GOAL #4   Title Pt will be able to perform work simulated tasks without overt LOB at an independent level in order to indicate safe return to work.     Time 8   Period Weeks   Status New     PT LONG TERM GOAL #5   Title Pt will ambulate 1000' over varying outdoor surfaces at an independent level in order to indicate safe return to community and leisure activities.     Time 8   Period Weeks   Status New               Plan - 02/11/17 1039    Clinical Impression Statement Pt presents s/p corpus collosum CVA, small watershed ACA infarcts, and R parietal infarct with resulting R and L hemiparesis (more so on the R) and mild speech/cognitive deficits causing pt not to be able to return to work, complete ADLs or play with children.  Note history of CHF,  DM, HTN and high cholesterol that could all impact progress with therapy (note that he had not gotten prescriptions refilled since leaving the hospital and therefore highly recommend he do that today before returning home).  Upon PT evaluation, note gait speed is 3.69 ft/sec, however with some unsteadiness/imbalance, difficulty with tandem stance, and narrow BOS with eyes closed.  Also note increased weakness in RLE>LLE.  Unable to fully assess another formal balance test due to time and pt with BP issues during session.  Upon arrival, BP was 158/105, following approx 15 mins of rest was 158/101, after another 5 mins of rest was 154/95, therefore continued with evaluation cautiously.  Recommended that pt keep check on BP and notify MD if it does not resolve (note he also had some dizziness and unsteadiness when walking back to treatment room).  Pt is of evolving presentation and moderate complexity from PT POC standpoint.  Pt will benefit from skilled OP neuro PT in order to address deficits.     Rehab Potential Good   Clinical Impairments Affecting Rehab Potential co-morbidities, high co pay   PT Frequency 1x / week   PT Duration 8 weeks   PT Treatment/Interventions ADLs/Self Care Home Management;DME Instruction;Gait training;Stair training;Functional  mobility training;Therapeutic activities;Therapeutic exercise;Balance training;Neuromuscular re-education;Patient/family education;Orthotic Fit/Training;Energy conservation;Vestibular   PT Next Visit Plan FGA, 6MWT, give HEP for RLE strength and balance, endurance   Consulted and Agree with Plan of Care Patient      Patient will benefit from skilled therapeutic intervention in order to improve the following deficits and impairments:  Abnormal gait, Decreased activity tolerance, Decreased balance, Decreased coordination, Decreased endurance, Decreased mobility, Decreased strength, Dizziness, Impaired perceived functional ability, Impaired sensation, Postural  dysfunction  Visit Diagnosis: Unsteadiness on feet - Plan: PT plan of care cert/re-cert  Hemiplegia and hemiparesis following other cerebrovascular disease affecting right dominant side ([place]) - Plan: PT plan of care cert/re-cert  Muscle weakness (generalized) - Plan: PT plan of care cert/re-cert  Other abnormalities of gait and mobility - Plan: PT plan of care cert/re-cert     Problem List Patient Active Problem List   Diagnosis Date Noted  . Mixed hyperlipidemia   . CVA (cerebral vascular accident) ([place]) 01/31/2017  . Erectile dysfunction 07/30/2016  . OSA (obstructive sleep apnea) 05/10/2015  . CKD (chronic kidney disease) stage 2, GFR 60-89 ml/min 03/10/2014  . Diabetes mellitus type II, controlled ([place]) 03/10/2014  . Chronic combined systolic and diastolic congestive heart failure ([place]) 03/03/2014  . Smoker   . Morbid obesity ([place])   . Hypertension 03/02/2014    Cameron Sprang, PT, MPT [facility] [address] [place], [place], [address] Phone: [phone]   Fax:  [phone] 02/11/17, 12:50 PM  Name: Sammual Longwith MRN: [id number] Date of Birth: [birth year]

## 2017-02-11 NOTE — Patient Instructions (Addendum)
   BRING THESE EXERCISES WITH YOU TO THERAPY!   Perform these exercises twice each day, at least  1) Pucker your lips and say "ooooo", then smile wide and say "eeeeee" 15x  2) Make a kissing sound as long and as loud as you can 15x  3) Say  "PA PA PA", "MA MA MA",  "ME ME ME" 10x          "TA TA TA",  "TEA TEA TEA",  "TWO TWO TWO" 10x  SAY THESE STRONG!   "KA KA KA", "KEY KEY KEY", "KOO KOO KOO"  10x  4) Press your lips together firmly (like making a /m/ sound) and hold 5 seconds [id number]  5) Move a licorice stick, lollipop, or a straw from side to side in your mouth  15x  6) Put air in your cheeks and hold for 5 seconds 15x  7) Put air in your cheeks, and push on either side 10-15x *KEEP THE AIR IN - DON'T LET IT ESCAPE!!*  8) Put your tongue in the pocket between your teeth and cheek, and go around in a circle 10x, switch direction and do that 10x  9) Push your cheek out with your tongue and press with two fingers for 3 seconds, then do the same with the other cheek too 10x

## 2017-02-20 ENCOUNTER — Encounter: Payer: Self-pay | Admitting: Family Medicine

## 2017-02-20 ENCOUNTER — Ambulatory Visit: Payer: 59 | Attending: Family Medicine | Admitting: Family Medicine

## 2017-02-20 VITALS — BP 151/91 | HR 66 | Temp 98.2°F | Ht 69.0 in | Wt 248.6 lb

## 2017-02-20 DIAGNOSIS — I69820 Aphasia following other cerebrovascular disease: Secondary | ICD-10-CM | POA: Diagnosis not present

## 2017-02-20 DIAGNOSIS — E1121 Type 2 diabetes mellitus with diabetic nephropathy: Secondary | ICD-10-CM | POA: Diagnosis present

## 2017-02-20 DIAGNOSIS — Z7982 Long term (current) use of aspirin: Secondary | ICD-10-CM | POA: Diagnosis not present

## 2017-02-20 DIAGNOSIS — I429 Cardiomyopathy, unspecified: Secondary | ICD-10-CM | POA: Diagnosis not present

## 2017-02-20 DIAGNOSIS — E782 Mixed hyperlipidemia: Secondary | ICD-10-CM

## 2017-02-20 DIAGNOSIS — I5042 Chronic combined systolic (congestive) and diastolic (congestive) heart failure: Secondary | ICD-10-CM

## 2017-02-20 DIAGNOSIS — F17200 Nicotine dependence, unspecified, uncomplicated: Secondary | ICD-10-CM | POA: Diagnosis not present

## 2017-02-20 DIAGNOSIS — R4701 Aphasia: Secondary | ICD-10-CM

## 2017-02-20 DIAGNOSIS — I69854 Hemiplegia and hemiparesis following other cerebrovascular disease affecting left non-dominant side: Secondary | ICD-10-CM | POA: Diagnosis not present

## 2017-02-20 DIAGNOSIS — I110 Hypertensive heart disease with heart failure: Secondary | ICD-10-CM | POA: Diagnosis not present

## 2017-02-20 DIAGNOSIS — I6300 Cerebral infarction due to thrombosis of unspecified precerebral artery: Secondary | ICD-10-CM | POA: Diagnosis not present

## 2017-02-20 DIAGNOSIS — I10 Essential (primary) hypertension: Secondary | ICD-10-CM

## 2017-02-20 DIAGNOSIS — Z7984 Long term (current) use of oral hypoglycemic drugs: Secondary | ICD-10-CM | POA: Diagnosis not present

## 2017-02-20 LAB — GLUCOSE, POCT (MANUAL RESULT ENTRY): POC Glucose: 78 mg/dl (ref 70–99)

## 2017-02-20 MED ORDER — EZETIMIBE 10 MG PO TABS: 10.0000 mg | ORAL_TABLET | Freq: Every day | ORAL | 5 refills | Status: DC

## 2017-02-20 MED ORDER — ASPIRIN 325 MG PO TABS: 325.0000 mg | ORAL_TABLET | Freq: Every day | ORAL | 5 refills | Status: DC

## 2017-02-20 MED ORDER — HYDRALAZINE HCL 25 MG PO TABS: 25.0000 mg | ORAL_TABLET | Freq: Three times a day (TID) | ORAL | 5 refills | Status: DC

## 2017-02-20 NOTE — Progress Notes (Signed)
Subjective:  Patient ID: Matthew [name], male    DOB: [birth year]  Age: 51 y.o. MRN: [id number]  CC: Hospitalization Follow-up (stroke); Hypertension; and Diabetes   HPI Matthew [name] is a 51 year old male with a history of type 2 diabetes mellitus (A1c 7.1), hypertension, previous tobacco abuse (quit a few weeks ago) who presents for follow-up from hospitalization at [facility] where he was recently managed for his stroke from 01/31/17 through 02/04/17.  He had presented to the ED with bilateral lower extremity weakness, dysarthria, tingling in his hand. CT head revealed abnormal low density in the left corpus callosum and pericallosal deep white matter that could represent recent left ACA infarction.  MRI; Acute infarction involving a large portion of the corpus callosum, left greater than right. Additional small left ACA/watershed infarcts. Punctate acute right parietal infarct. 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 He did not receive TPA due to being outside the window. TEE was negative for embolic source; 2-D echo revealed EF of 45-50%, carotid Dopplers were negative. His lisinopril, Imdur, spironolactone were discontinued and Zetia was added to regimen.  Since discharge he has been undergoing PT/OT/ST. 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 Complains of tingling in hands, speech difficulty and wobbly gait He will need a note for work and has also dropped off short-term disability form.  Past Medical History:  Diagnosis Date  . Acute combined systolic and diastolic CHF, NYHA class 3 ([place])    a. 02/2014 Echo: EF 25-30%.  . Cardiomyopathy ([place])    a. 02/2014 Echo: EF 25-30%, sev glob HK with inferolat HK->AK, mod conc LVH, Gr 2 DD, Mild MR, sev dil LA.  Marland Kitchen Hypertension   . Morbid obesity ([place])   . Tobacco abuse     Past Surgical History:  Procedure Laterality  Date  . CATARACT EXTRACTION Right   . LEFT HEART CATHETERIZATION WITH CORONARY ANGIOGRAM N/A 03/04/2014   Procedure: LEFT HEART CATHETERIZATION WITH CORONARY ANGIOGRAM;  Surgeon: Burnell Blanks, MD;  Location: [facility] [place] CATH LAB;  Service: Cardiovascular;  Laterality: N/A;  . TEE WITHOUT CARDIOVERSION N/A 02/04/2017   Procedure: TRANSESOPHAGEAL ECHOCARDIOGRAM (TEE);  Surgeon: Dorothy Spark, MD;  Location: [facility] ENDOSCOPY;  Service: Cardiovascular;  Laterality: N/A;    No Known Allergies   Outpatient Medications Prior to Visit  Medication Sig Dispense Refill  . carvedilol (COREG) 6.25 MG tablet Take 1 tablet (6.25 mg total) by mouth 2 (two) times daily with a meal. 60 tablet 5  . furosemide (LASIX) 40 MG tablet Take 1 tablet (40 mg total) by mouth daily. 30 tablet 0  . glipiZIDE (GLUCOTROL) 5 MG tablet Take 1 tablet (5 mg total) by mouth 2 (two) times daily before a meal. 60 tablet 3  . glucose blood (ONE TOUCH ULTRA TEST) test strip Use as instructed 30 each 12  . simvastatin (ZOCOR) 20 MG tablet Take 1 tablet (20 mg total) by mouth at bedtime. 30 tablet 0  . hydrALAZINE (APRESOLINE) 10 MG tablet Take 1 tablet (10 mg total) by mouth 3 (three) times daily. 90 tablet 5  . acetaminophen (TYLENOL) 500 MG tablet Take 500 mg by mouth every 6 (six) hours as needed for mild pain.    Marland Kitchen aspirin 325 MG tablet Take 1 tablet (325 mg total) by mouth daily. (Patient not taking: Reported on 02/20/2017) 30 tablet 0  .  ezetimibe (ZETIA) 10 MG tablet Take 1 tablet (10 mg total) by mouth daily. (Patient not taking: Reported on 02/20/2017) 30 tablet 0   No facility-administered medications prior to visit.     ROS Review of Systems  Constitutional: Negative for activity change and appetite change.  HENT: Negative for sinus pressure and sore throat.   Eyes: Negative for visual disturbance.  Respiratory: Negative for cough, chest tightness and shortness of breath.   Cardiovascular: Negative for chest pain and  leg swelling.  Gastrointestinal: Negative for abdominal distention, abdominal pain, constipation and diarrhea.  Endocrine: Negative.   Genitourinary: Negative for dysuria.  Musculoskeletal: Negative for joint swelling and myalgias.  Skin: Negative for rash.  Allergic/Immunologic: Negative.   Neurological: Positive for speech difficulty and weakness. Negative for light-headedness and numbness.  Psychiatric/Behavioral: Negative for dysphoric mood and suicidal ideas.    Objective:  BP (!) 151/91 (BP Location: Right Arm, Patient Position: Sitting, Cuff Size: Large)   Pulse 66   Temp 98.2 F (36.8 C) (Oral)   Ht 5\' 9"  (1.753 m)   Wt 248 lb 9.6 oz (112.8 kg)   SpO2 98%   BMI 36.71 kg/m   BP/Weight 02/20/2017 02/04/2017 3/71/0626  Systolic BP 948 546 270  Diastolic BP 91 68 88  Wt. (Lbs) 248.6 240 260.8  BMI 36.71 36.49 37.42      Physical Exam  Constitutional: He is oriented to person, place, and time. He appears well-developed and well-nourished.  Cardiovascular: Normal rate, normal heart sounds and intact distal pulses.   No murmur heard. Pulmonary/Chest: Effort normal and breath sounds normal. He has no wheezes. He has no rales. He exhibits no tenderness.  Abdominal: Soft. Bowel sounds are normal. He exhibits no distension and no mass. There is no tenderness.  Musculoskeletal: Normal range of motion.  Neurological: He is alert and oriented to person, place, and time. A cranial nerve deficit (left facial nerve palsy) is present. Gait abnormal.  Motor strength: Left upper extremity - 4/5; left lower extremity -4+/5 Right upper extremity- 5/5; right lower extremity -5/5  Skin: Skin is warm and dry.  Psychiatric: He has a normal mood and affect.     Lab Results  Component Value Date   HGBA1C 7.1 (H) 02/01/2017    CMP Latest Ref Rng & Units 01/31/2017 01/31/2017 01/01/2017  Glucose 65 - 99 mg/dL 146(H) 143(H) 142(H)  BUN 6 - 20 mg/dL 13 11 13   Creatinine 0.61 - 1.24 mg/dL  1.40(H) 1.39(H) 1.26  Sodium 135 - 145 mmol/L 140 139 137  Potassium 3.5 - 5.1 mmol/L 3.9 4.0 4.3  Chloride 101 - 111 mmol/L 104 105 105  CO2 22 - 32 mmol/L - 23 25  Calcium 8.9 - 10.3 mg/dL - 9.5 9.0  Total Protein 6.5 - 8.1 g/dL - 7.1 6.7  Total Bilirubin 0.3 - 1.2 mg/dL - 0.9 0.3  Alkaline Phos 38 - 126 U/L - 61 62  AST 15 - 41 U/L - 21 21  ALT 17 - 63 U/L - 22 31     Assessment & Plan:   1. Controlled type 2 diabetes mellitus with diabetic nephropathy, without long-term current use of insulin (HCC) Controlled with A1c of 7.1 Continue glipizide Diabetic diet - Glucose (CBG)  2. Morbid obesity (HCC) Reduce portion sizes, weight loss, increase physical activity  3. Mixed hyperlipidemia Currently on statin We'll consider increasing dose of simvastatin at next visit due to his increased cardiovascular risk Low-cholesterol diet - ezetimibe (ZETIA) 10 MG tablet; Take  1 tablet (10 mg total) by mouth daily.  Dispense: 30 tablet; Refill: 5  4. Chronic combined systolic and diastolic congestive heart failure (HCC) EF of 40-45% from 2-D echo of 01/2017 Euvolemic  5. Cerebrovascular accident (CVA) due to thrombosis of precerebral artery ([place]) With left hemiparesis and expressive aphasia I have provided a note for work for him putting him out for the next one month Continue PT/OT/ST - aspirin 325 MG tablet; Take 1 tablet (325 mg total) by mouth daily.  Dispense: 30 tablet; Refill: 5 - Ambulatory referral to Neurology  6. Expressive aphasia - Ambulatory referral to Neurology  7. Essential hypertension Uncontrolled Raised dose of hydralazine - hydrALAZINE (APRESOLINE) 25 MG tablet; Take 1 tablet (25 mg total) by mouth 3 (three) times daily.  Dispense: 90 tablet; Refill: 5   Meds ordered this encounter  Medications  . ezetimibe (ZETIA) 10 MG tablet    Sig: Take 1 tablet (10 mg total) by mouth daily.    Dispense:  30 tablet    Refill:  5  . aspirin 325 MG tablet    Sig:  Take 1 tablet (325 mg total) by mouth daily.    Dispense:  30 tablet    Refill:  5  . hydrALAZINE (APRESOLINE) 25 MG tablet    Sig: Take 1 tablet (25 mg total) by mouth 3 (three) times daily.    Dispense:  90 tablet    Refill:  5    Follow-up: Return in about 1 month (around 03/23/2017) for Follow-up on CVA.   Arnoldo Morale MD

## 2017-02-21 MED FILL — FUROSEMIDE 40 MG TABLET: 40 | 30 days supply | Qty: 30 | Fill #0

## 2017-02-21 MED FILL — EZETIMIBE 10 MG TABLET: 10 | 30 days supply | Qty: 30 | Fill #0

## 2017-02-21 MED FILL — hydrALAZINE HCL 25 MG TABS: 25 | 30 days supply | Qty: 90 | Fill #0

## 2017-02-21 MED FILL — SIMVASTATIN 20 MG TABLET: 20 | 30 days supply | Qty: 30 | Fill #0

## 2017-02-22 ENCOUNTER — Encounter: Payer: Self-pay | Admitting: Rehabilitation

## 2017-02-22 ENCOUNTER — Ambulatory Visit: Payer: 59 | Attending: Internal Medicine | Admitting: *Deleted

## 2017-02-22 ENCOUNTER — Ambulatory Visit: Payer: 59 | Admitting: Rehabilitation

## 2017-02-22 VITALS — BP 163/105

## 2017-02-22 DIAGNOSIS — I69390 Apraxia following cerebral infarction: Secondary | ICD-10-CM | POA: Insufficient documentation

## 2017-02-22 DIAGNOSIS — I69851 Hemiplegia and hemiparesis following other cerebrovascular disease affecting right dominant side: Secondary | ICD-10-CM | POA: Insufficient documentation

## 2017-02-22 DIAGNOSIS — R471 Dysarthria and anarthria: Secondary | ICD-10-CM | POA: Insufficient documentation

## 2017-02-22 DIAGNOSIS — R2681 Unsteadiness on feet: Secondary | ICD-10-CM | POA: Insufficient documentation

## 2017-02-22 NOTE — Therapy (Signed)
[facility] [address] [place], [place], [address] Phone: [phone]   Fax:  [phone]  Speech Language Pathology Treatment  Patient Details  Name: Matthew [name] MRN: [id number] Date of Birth: 10/12/1966 Referring Provider: Jeanmarie Hubert, MD  Encounter Date: 02/22/2017      End of Session - 02/22/17 1617    Visit Number 2   Number of Visits 9   Date for SLP Re-Evaluation 04/19/17   SLP Start Time 3354   SLP Stop Time  1520   SLP Time Calculation (min) 35 min   Activity Tolerance Patient tolerated treatment well      Past Medical History:  Diagnosis Date  . Acute combined systolic and diastolic CHF, NYHA class 3 ([place])    a. 02/2014 Echo: EF 25-30%.  . Cardiomyopathy ([place])    a. 02/2014 Echo: EF 25-30%, sev glob HK with inferolat HK->AK, mod conc LVH, Gr 2 DD, Mild MR, sev dil LA.  Marland Kitchen Hypertension   . Morbid obesity ([place])   . Tobacco abuse     Past Surgical History:  Procedure Laterality Date  . CATARACT EXTRACTION Right   . LEFT HEART CATHETERIZATION WITH CORONARY ANGIOGRAM N/A 03/04/2014   Procedure: LEFT HEART CATHETERIZATION WITH CORONARY ANGIOGRAM;  Surgeon: Burnell Blanks, MD;  Location: [facility] CATH LAB;  Service: Cardiovascular;  Laterality: N/A;  . TEE WITHOUT CARDIOVERSION N/A 02/04/2017   Procedure: TRANSESOPHAGEAL ECHOCARDIOGRAM (TEE);  Surgeon: Dorothy Spark, MD;  Location: [facility];  Service: Cardiovascular;  Laterality: N/A;    There were no vitals filed for this visit.      Subjective Assessment - 02/22/17 1450    Subjective I have to slow down and think about what I want to say   Currently in Pain? No/denies               ADULT SLP TREATMENT - 02/22/17 0001      General Information   Behavior/Cognition Alert;Cooperative;Pleasant mood     Treatment Provided   Treatment provided Cognitive-Linquistic     Pain Assessment   Pain Assessment No/denies pain     Cognitive-Linquistic Treatment   Treatment focused on Dysarthria;Patient/family/caregiver education   Skilled Treatment Assessment of apraxia completed - No evidence of oral  or verbal apraxia observed. Pt was provided with strategies for improving intelligibility of speech, strengthening exercises for lips and tongue, diaphragmatic breathing exercises. Pt was encouraged to complete exercises and practice strategies at least 2x/day.     Assessment / Recommendations / Plan   Plan Continue with current plan of care     Progression Toward Goals   Progression toward goals Progressing toward goals          SLP Education - 02/22/17 1616    Education provided Yes   Education Details HEP for dysarthria, diaphragmatic breathing exercises, strategies for improving intelligibility   Person(s) Educated Patient   Methods Explanation;Demonstration;Handout;Verbal cues;Tactile cues   Comprehension Verbalized understanding;Need further instruction;Returned demonstration;Verbal cues required          SLP Short Term Goals - 02/22/17 1621      SLP SHORT TERM GOAL #1   Title pt will complete dysarthria HEP with rare min A over two sessions   Time 4   Period --  visits   Status On-going     SLP SHORT TERM GOAL #2   Title pt will incorporate compensatory strategies into 18/20 sentences   Time 4   Period --  visits   Status  On-going     SLP SHORT TERM GOAL #3   Title pt will demo compensatory strategies for fluency/apraxia in 5 minutes simple conversation 75% of the time   Time 4   Period --  visits   Status On-going          SLP Long Term Goals - 02/22/17 1621      SLP LONG TERM GOAL #1   Title pt will complete HEP for dysarhtria with modified independence   Time 8   Period --  visits   Status On-going     SLP LONG TERM GOAL #2   Title pt will use compensatory strategies for apraxia/fluency in 5 minutes mod complex conversation 75% of the time   Time 8   Period --  visits    Status On-going          Plan - 02/22/17 1617    Clinical Impression Statement Oral and verbal apraxia were assessed this session, and no characteristics were observed. Question need for cognitive assessment, due to concern for proper self-medication (? history of non-compliance?), as well as level of independence prior to CVA. Mild dysarthria, characterized primarily by minimal mouth movement and rapid speech rate. Pt reports "fading out" during speech, likely due to poor breath control. Pt receptive to strategies and exercises for improvement of functional communication. Continued ST intervention is recommended, targeting improved functional and effective communication.   Speech Therapy Frequency 1x /week   Duration --  8 weeks   Treatment/Interventions Oral motor exercises;Compensatory strategies;Patient/family education;Functional tasks;Cueing hierarchy;SLP instruction and feedback;Internal/external aids   Potential to Achieve Goals Fair   Potential Considerations Cooperation/participation level   Consulted and Agree with Plan of Care Patient      Patient will benefit from skilled therapeutic intervention in order to improve the following deficits and impairments:   Dysarthria and anarthria    Problem List Patient Active Problem List   Diagnosis Date Noted  . Expressive aphasia 02/20/2017  . Mixed hyperlipidemia   . CVA (cerebral vascular accident) ([place]) 01/31/2017  . Erectile dysfunction 07/30/2016  . OSA (obstructive sleep apnea) 05/10/2015  . CKD (chronic kidney disease) stage 2, GFR 60-89 ml/min 03/10/2014  . Diabetes mellitus type II, controlled ([place]) 03/10/2014  . Chronic combined systolic and diastolic congestive heart failure ([place]) 03/03/2014  . Smoker   . Morbid obesity ([place])   . Hypertension 03/02/2014   Will Heinkel B. [place], MSP, CCC-SLP  Shonna Chock 02/22/2017, 4:22 PM  [facility] [address] [place], [place], [address] Phone: [phone]   Fax:  [phone]   Name: Matthew [name] MRN: [id number] Date of Birth: 1966/04/01

## 2017-02-22 NOTE — Therapy (Signed)
[facility] [address] [place], [place], [address] Phone: [phone]   Fax:  [phone]  Patient Details  Name: Matthew [name] MRN: [id number] Date of Birth: 1966-09-27 Referring Provider:  Arnoldo Morale, MD  Encounter Date: 02/22/2017    Note pt arrived to visit following seeing SLP for her session.  Pt resting in lobby for several minutes prior to ambulating back to treatment area.  Once arrived, spoke with pt briefly about his return to work and that returning 5 days/week may cause too much fatigue at this time.  Following discussion, checked BP prior to treatment and note was 163/105.  Following 3-4 mins of rest BP was 164/106, therefore deferred session today per BP treatment guidelines.  Pt states he did not take his midday BP medication, and needs to go by pharmacy to pick up new BP medication therefore highly recommend he pick up medication and take as advised to prevent another CVA. Pt verbalized understanding.     Cameron Sprang, PT, MPT [facility] [address] [place], [place], [address] Phone: [phone]   Fax:  [phone] 02/22/17, 3:58 PM

## 2017-03-01 ENCOUNTER — Ambulatory Visit: Payer: 59 | Admitting: Rehabilitative and Restorative Service Providers"

## 2017-03-01 ENCOUNTER — Telehealth: Payer: Self-pay

## 2017-03-01 ENCOUNTER — Ambulatory Visit: Payer: 59

## 2017-03-01 DIAGNOSIS — R471 Dysarthria and anarthria: Secondary | ICD-10-CM

## 2017-03-01 NOTE — Telephone Encounter (Signed)
Patient called today by writer to inform him that his disability paperwork has been completed and faxed.  Writer has a copy in desk drawer, patient to pick up the original.  Patient stated understanding.

## 2017-03-01 NOTE — Patient Instructions (Signed)
  Please complete the assigned speech therapy homework prior to your next session and return it to the speech therapist at your next visit.  

## 2017-03-01 NOTE — Therapy (Signed)
[facility] [address] [place], [place], [address] Phone: [phone]   Fax:  [phone]  Speech Language Pathology Treatment  Patient Details  Name: Matthew [name] MRN: [id number] Date of Birth: 03-28-1966 Referring Provider: Jeanmarie Hubert, MD  Encounter Date: 03/01/2017      End of Session - 03/01/17 1623    Visit Number 3   Number of Visits 9   Date for SLP Re-Evaluation 04/19/17   SLP Start Time 1539  pt was late arriving to [facility]   SLP Stop Time  1615   SLP Time Calculation (min) 36 min   Activity Tolerance Patient tolerated treatment well      Past Medical History:  Diagnosis Date  . Acute combined systolic and diastolic CHF, NYHA class 3 ([place])    a. 02/2014 Echo: EF 25-30%.  . Cardiomyopathy ([place])    a. 02/2014 Echo: EF 25-30%, sev glob HK with inferolat HK->AK, mod conc LVH, Gr 2 DD, Mild MR, sev dil LA.  Marland Kitchen Hypertension   . Morbid obesity ([place])   . Tobacco abuse     Past Surgical History:  Procedure Laterality Date  . CATARACT EXTRACTION Right   . LEFT HEART CATHETERIZATION WITH CORONARY ANGIOGRAM N/A 03/04/2014   Procedure: LEFT HEART CATHETERIZATION WITH CORONARY ANGIOGRAM;  Surgeon: Burnell Blanks, MD;  Location: [facility] CATH LAB;  Service: Cardiovascular;  Laterality: N/A;  . TEE WITHOUT CARDIOVERSION N/A 02/04/2017   Procedure: TRANSESOPHAGEAL ECHOCARDIOGRAM (TEE);  Surgeon: Dorothy Spark, MD;  Location: [facility];  Service: Cardiovascular;  Laterality: N/A;    There were no vitals filed for this visit.      Subjective Assessment - 03/01/17 1538    Subjective "I left her exercises at home - I had them to bring and then forgot them."               ADULT SLP TREATMENT - 03/01/17 1556      General Information   Behavior/Cognition Alert;Cooperative;Pleasant mood     Treatment Provided   Treatment provided Cognitive-Linquistic     Cognitive-Linquistic Treatment   Treatment  focused on Dysarthria;Patient/family/caregiver education   Skilled Treatment Pt stated he completed HEP routinely since last session. SLP reviewed blood pressure med administration - take every day, do not skip a day. REviewed HEP that SLP provided to pt on evaluation day (see "pt instructions" from 02-11-17) and pt req'd mod cues usually. SLP also reviewed speech strategies ("Talk big" and "overenunciate") and exampled x3-4 sentences. In picture description of one sentence each pt req'd mod A consistently. In other sentence responses as session progressed pt req'd less cueing, min A occasionally. Pt's overarticulation was unnatural however SLP told him that when he used overarticulation routinely it would look more naturalized.     Assessment / Recommendations / Plan   Plan Continue with current plan of care     Progression Toward Goals   Progression toward goals Progressing toward goals          SLP Education - 03/01/17 1620    Education provided Yes   Education Details HEP, speech strategies   Person(s) Educated Patient   Methods Explanation;Demonstration;Handout   Comprehension Verbalized understanding;Returned demonstration;Need further instruction;Verbal cues required          SLP Short Term Goals - 03/01/17 1627      SLP SHORT TERM GOAL #1   Title pt will complete dysarthria HEP with rare min A over two sessions   Time 3  Period --  visits   Status On-going     SLP SHORT TERM GOAL #2   Title pt will incorporate speech compensatory strategies into 18/20 sentences   Time 3   Period --  visits   Status On-going     SLP SHORT TERM GOAL #3   Title pt will demo compensatory strategies for fluency in 5 minutes simple conversation 75% of the time   Time 3   Period --  visits   Status On-going          SLP Long Term Goals - 03/01/17 1628      SLP LONG TERM GOAL #1   Title pt will complete HEP for dysarhtria with modified independence   Time 7   Period --  visits    Status On-going     SLP LONG TERM GOAL #2   Title pt will use compensatory strategies for apraxia/fluency in 5 minutes mod complex conversation 75% of the time   Time 7   Period --  visits   Status On-going          Plan - 03/01/17 1624    Clinical Impression Statement Pt presents today to [facility] with cont'd mild dysarthria including rushes of speech which decr pt's overall intelligibility. As pt req'd mod A for dysarthria HEP SLP questions validity of pt comment about performing HEP routinely. Slowed rate and overarticulation improved pt's speech clarity in conversation. Pt would like to be seen x1/week due to high copay. Skilled ST is appropriate for improving pt's verbal communication to approximate PLOF.   Speech Therapy Frequency 1x /week   Duration --  8 weeks   Treatment/Interventions Oral motor exercises;Compensatory strategies;Patient/family education;Functional tasks;Cueing hierarchy;SLP instruction and feedback;Internal/external aids   Potential to Achieve Goals Fair   Potential Considerations --  requested sessions at x1/week      Patient will benefit from skilled therapeutic intervention in order to improve the following deficits and impairments:   Dysarthria and anarthria    Problem List Patient Active Problem List   Diagnosis Date Noted  . Expressive aphasia 02/20/2017  . Mixed hyperlipidemia   . CVA (cerebral vascular accident) ([place]) 01/31/2017  . Erectile dysfunction 07/30/2016  . OSA (obstructive sleep apnea) 05/10/2015  . CKD (chronic kidney disease) stage 2, GFR 60-89 ml/min 03/10/2014  . Diabetes mellitus type II, controlled ([place]) 03/10/2014  . Chronic combined systolic and diastolic congestive heart failure ([place]) 03/03/2014  . Smoker   . Morbid obesity ([place])   . Hypertension 03/02/2014    [facility], [place]  03/01/2017, 4:29 PM  [facility] [address] [place], [place],  [address] Phone: [phone]   Fax:  [phone]   Name: Matthew [name] MRN: [id number] Date of Birth: 14-May-1966

## 2017-03-08 ENCOUNTER — Ambulatory Visit: Payer: 59

## 2017-03-08 ENCOUNTER — Encounter: Payer: Self-pay | Admitting: Physical Therapy

## 2017-03-08 ENCOUNTER — Other Ambulatory Visit: Payer: Self-pay

## 2017-03-08 ENCOUNTER — Ambulatory Visit: Payer: 59 | Admitting: Physical Therapy

## 2017-03-08 VITALS — BP 164/106 | HR 79

## 2017-03-08 DIAGNOSIS — E119 Type 2 diabetes mellitus without complications: Secondary | ICD-10-CM

## 2017-03-08 DIAGNOSIS — R471 Dysarthria and anarthria: Secondary | ICD-10-CM

## 2017-03-08 DIAGNOSIS — I69851 Hemiplegia and hemiparesis following other cerebrovascular disease affecting right dominant side: Secondary | ICD-10-CM

## 2017-03-08 DIAGNOSIS — R2681 Unsteadiness on feet: Secondary | ICD-10-CM

## 2017-03-08 DIAGNOSIS — I69390 Apraxia following cerebral infarction: Secondary | ICD-10-CM

## 2017-03-08 MED ORDER — GLIPIZIDE 5 MG PO TABS: 5.0000 mg | ORAL_TABLET | Freq: Two times a day (BID) | ORAL | 3 refills | Status: DC

## 2017-03-08 MED FILL — glipiZIDE 5 MG TABS: 5 | 30 days supply | Qty: 60 | Fill #3

## 2017-03-08 NOTE — Therapy (Signed)
[facility] [address] [place], [place], [address] Phone: [phone]   Fax:  [phone]  Physical Therapy Treatment  Patient Details  Name: Matthew [name] MRN: [id number] Date of Birth: May 13, 1966 Referring Provider: Arnoldo Morale, MD  Encounter Date: 03/08/2017      PT End of Session - 03/08/17 1502    Visit Number 1  visit cancelled due to BP   Number of Visits 9   Date for PT Re-Evaluation 04/12/17   Authorization Type [employer] 30 visit limit per discipline   Authorization - Visit Number 1   Authorization - Number of Visits 30   PT Start Time 1450   PT Stop Time 6811  no charge, elevated BP   PT Time Calculation (min) 9 min   Activity Tolerance Treatment limited secondary to medical complications (Comment)   Behavior During Therapy [facility] for tasks assessed/performed      Past Medical History:  Diagnosis Date  . Acute combined systolic and diastolic CHF, NYHA class 3 ([place])    a. 02/2014 Echo: EF 25-30%.  . Cardiomyopathy ([place])    a. 02/2014 Echo: EF 25-30%, sev glob HK with inferolat HK->AK, mod conc LVH, Gr 2 DD, Mild MR, sev dil LA.  Marland Kitchen Hypertension   . Morbid obesity ([place])   . Tobacco abuse     Past Surgical History:  Procedure Laterality Date  . CATARACT EXTRACTION Right   . LEFT HEART CATHETERIZATION WITH CORONARY ANGIOGRAM N/A 03/04/2014   Procedure: LEFT HEART CATHETERIZATION WITH CORONARY ANGIOGRAM;  Surgeon: Burnell Blanks, MD;  Location: [facility] CATH LAB;  Service: Cardiovascular;  Laterality: N/A;  . TEE WITHOUT CARDIOVERSION N/A 02/04/2017   Procedure: TRANSESOPHAGEAL ECHOCARDIOGRAM (TEE);  Surgeon: Dorothy Spark, MD;  Location: [place];  Service: Cardiovascular;  Laterality: N/A;    Vitals:   03/08/17 1452 03/08/17 1455  BP: (!) 163/103- with automated machine (!) 164/106- manual recheck  Pulse: 79         Subjective Assessment - 03/08/17 1450    Subjective No new complaints. No  falls or pain to report. Not sure about BP, not monitoring at home every day.    Limitations House hold activities;Walking   Patient Stated Goals "I want to get my speech and my balance back like it was."    Currently in Pain? No/denies             PT Short Term Goals - 02/11/17 1051      PT SHORT TERM GOAL #1   Title Pt will initiate HEP in order to indicate improved functional mobility and improved balance.  (Target Date: 03/11/17)   Time 4   Period Weeks   Status New     PT SHORT TERM GOAL #2   Title Will assess 6MWT and improve distance by 150' in order to indicate improved functional endurance.     Time 4   Period Weeks   Status New     PT SHORT TERM GOAL #3   Title Will assess FGA and improve score by 2 points from baseline in order to indicate improved balance and decreased fall risk.     Time 4   Period Weeks   Status New     PT SHORT TERM GOAL #4   Title Pt will ambulate 500' over unlevel paved surfaces (including ramp and curb) at mod I level in order to indicate safe return to community.     Time 4   Period Weeks  Status New           PT Long Term Goals - 02/11/17 1054      PT LONG TERM GOAL #1   Title Pt will be independent with HEP in order to indicate improved functional endurance and balance.  (Target Date: 04/08/17)   Time 8   Period Weeks   Status New     PT LONG TERM GOAL #2   Title Pt will improve 6MWT by 300' from baseline in order to indicate improved functional endurance.     Time 8   Period Weeks   Status New     PT LONG TERM GOAL #3   Title Pt will improve FGA by 4 points from baseline in order to indicate decreased fall risk.    Time 8   Period Weeks   Status New     PT LONG TERM GOAL #4   Title Pt will be able to perform work simulated tasks without overt LOB at an independent level in order to indicate safe return to work.     Time 8   Period Weeks   Status New     PT LONG TERM GOAL #5   Title Pt will ambulate 1000' over  varying outdoor surfaces at an independent level in order to indicate safe return to community and leisure activities.     Time 8   Period Weeks   Status New            Plan - 03/08/17 1503    Clinical Impression Statement Pt continues to present to PT with elevated BP readings. Reports he is taking his medication as he is supposed to and following diet restrictions. Session held today due to elevated BP. Pt has a follow up appt with primary Md on April 10th, 2018. Will route this note to Md to allow them advanced notice of pt's BP's with therapy. Pt's progress has been limited due to medical issues with BP and pt should benefit from continued PT to progress toward unmet goals as BP allows.                       Rehab Potential Good   Clinical Impairments Affecting Rehab Potential co-morbidities, high co pay   PT Frequency 1x / week   PT Duration 8 weeks   PT Treatment/Interventions ADLs/Self Care Home Management;DME Instruction;Gait training;Stair training;Functional mobility training;Therapeutic activities;Therapeutic exercise;Balance training;Neuromuscular re-education;Patient/family education;Orthotic Fit/Training;Energy conservation;Vestibular   PT Next Visit Plan FGA, 6MWT, give HEP for RLE strength and balance, endurance   Consulted and Agree with Plan of Care Patient      Patient will benefit from skilled therapeutic intervention in order to improve the following deficits and impairments:  Abnormal gait, Decreased activity tolerance, Decreased balance, Decreased coordination, Decreased endurance, Decreased mobility, Decreased strength, Dizziness, Impaired perceived functional ability, Impaired sensation, Postural dysfunction  Visit Diagnosis: Unsteadiness on feet  Hemiplegia and hemiparesis following other cerebrovascular disease affecting right dominant side [facility])     Problem List Patient Active Problem List   Diagnosis Date Noted  . Expressive aphasia 02/20/2017  .  Mixed hyperlipidemia   . CVA (cerebral vascular accident) ([place]) 01/31/2017  . Erectile dysfunction 07/30/2016  . OSA (obstructive sleep apnea) 05/10/2015  . CKD (chronic kidney disease) stage 2, GFR 60-89 ml/min 03/10/2014  . Diabetes mellitus type II, controlled ([place]) 03/10/2014  . Chronic combined systolic and diastolic congestive heart failure ([place]) 03/03/2014  . Smoker   . Morbid obesity (  [place])   . Hypertension 03/02/2014    Willow Ora, PTA, [facility] [address] [place], [place] [address] [phone] 03/08/17, 3:09 PM   Name: Matthew [name] MRN: [id number] Date of Birth: 1966-01-23

## 2017-03-11 ENCOUNTER — Ambulatory Visit: Payer: 59 | Admitting: Neurology

## 2017-03-11 NOTE — Therapy (Signed)
[facility] [address] [place], [place], [address] Phone: [phone]   Fax:  [phone]  Speech Language Pathology Treatment  Patient Details  Name: Matthew [name] MRN: [id number] Date of Birth: 10/12/1966 Referring Provider: Jeanmarie Hubert, MD  Encounter Date: 03/08/2017    Past Medical History:  Diagnosis Date  . Acute combined systolic and diastolic CHF, NYHA class 3 ([place])    a. 02/2014 Echo: EF 25-30%.  . Cardiomyopathy ([place])    a. 02/2014 Echo: EF 25-30%, sev glob HK with inferolat HK->AK, mod conc LVH, Gr 2 DD, Mild MR, sev dil LA.  Marland Kitchen Hypertension   . Morbid obesity ([place])   . Tobacco abuse     Past Surgical History:  Procedure Laterality Date  . CATARACT EXTRACTION Right   . LEFT HEART CATHETERIZATION WITH CORONARY ANGIOGRAM N/A 03/04/2014   Procedure: LEFT HEART CATHETERIZATION WITH CORONARY ANGIOGRAM;  Surgeon: Burnell Blanks, MD;  Location: [facility] CATH LAB;  Service: Cardiovascular;  Laterality: N/A;  . TEE WITHOUT CARDIOVERSION N/A 02/04/2017   Procedure: TRANSESOPHAGEAL ECHOCARDIOGRAM (TEE);  Surgeon: Dorothy Spark, MD;  Location: [facility];  Service: Cardiovascular;  Laterality: N/A;    There were no vitals filed for this visit.      Subjective Assessment - 03/11/17 0856    Subjective Pt did not bring exercises again.               ADULT SLP TREATMENT - 03/08/17 0001      General Information   Behavior/Cognition Alert;Cooperative;Pleasant mood     Treatment Provided   Treatment provided Cognitive-Linquistic     Cognitive-Linquistic Treatment   Treatment focused on Dysarthria   Skilled Treatment Pt told SLP he ran out of diabetes med and called it in this morning. Should be here Tuesday or so. SLP suggested to pt to stop at [facility] and see if they have emergency med to provide as it is Glipazide. Pt's speech appears to this SLP to be markedly better than previous sessions. In  multiple sentence responses pt was assessed with mild dysarthria, intemittently - fast speech rate complicated efforts with speech clarity.  Min SLP VCs rarely were successful for improving pt's intelligibility.  HEP completed with rare min A.      Assessment / Recommendations / Plan   Plan Continue with current plan of care     Progression Toward Goals   Progression toward goals Progressing toward goals            SLP Short Term Goals - 03/08/17 0001      SLP SHORT TERM GOAL #1   Title pt will complete dysarthria HEP with rare min A over two sessions   Baseline 03-08-17   Time 2   Period --  visits   Status On-going     SLP SHORT TERM GOAL #2   Title pt will incorporate speech compensatory strategies into 18/20 sentences   Status Achieved     SLP SHORT TERM GOAL #3   Title pt will demo compensatory strategies for fluency in 5 minutes simple conversation 75% of the time   Status Deferred  due to minmal/no dysfluency in converastional speech          SLP Long Term Goals - 03/08/17 0001      SLP LONG TERM GOAL #1   Title pt will complete HEP for dysarhtria with modified independence   Time 6   Period --  visits   Status On-going  SLP LONG TERM GOAL #2   Title pt will use compensatory strategies for apraxia/fluency in 5 minutes mod complex conversation 75% of the time   Time 6   Period --  visits   Status On-going          Plan - 03/11/17 0001    Clinical Impression Statement Pt presents today to ST with mild intermittent dysarthria - speech definitely improved from last session with this SLP. Rushes of speech which decr pt's overall intelligibility were not heard today. Pt req'd rare min A for dysarthria HEP. Slowed rate improved pt's speech clarity in conversation. Suspect pt requires 1-2 more ST sessions to ensure consistency in conversation. Pt reports decr'd stamina which he feels is most hindering his recovery at this time and voices hope that PT will  assist with this.    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 Expressive aphasia 02/20/2017  . Mixed hyperlipidemia   . CVA (cerebral vascular accident) ([place]) 01/31/2017  . Erectile dysfunction 07/30/2016  . OSA (obstructive sleep apnea) 05/10/2015  . CKD (chronic kidney disease) stage 2, GFR 60-89 ml/min 03/10/2014  . Diabetes mellitus type II, controlled ([place]) 03/10/2014  . Chronic combined systolic and diastolic congestive heart failure ([place]) 03/03/2014  . Smoker   . Morbid obesity ([place])   . Hypertension 03/02/2014    [facility] ,[place], [place]  03/11/2017, 9:21 AM  [facility] [address] [place], [place], [address] Phone: [phone]   Fax:  [phone]   Name: Matthew [name] MRN: [id number] Date of Birth: [birth year]

## 2017-03-22 ENCOUNTER — Ambulatory Visit: Payer: 59 | Attending: Internal Medicine | Admitting: Physical Therapy

## 2017-03-22 ENCOUNTER — Encounter: Payer: Self-pay | Admitting: Physical Therapy

## 2017-03-22 ENCOUNTER — Ambulatory Visit: Payer: 59

## 2017-03-22 VITALS — BP 180/110

## 2017-03-22 DIAGNOSIS — R2689 Other abnormalities of gait and mobility: Secondary | ICD-10-CM

## 2017-03-22 DIAGNOSIS — M6281 Muscle weakness (generalized): Secondary | ICD-10-CM

## 2017-03-22 DIAGNOSIS — R2681 Unsteadiness on feet: Secondary | ICD-10-CM

## 2017-03-22 DIAGNOSIS — I69851 Hemiplegia and hemiparesis following other cerebrovascular disease affecting right dominant side: Secondary | ICD-10-CM

## 2017-03-22 NOTE — Therapy (Signed)
[facility] [address] [place], [place], [address] Phone: [phone]   Fax:  [phone]  Physical Therapy Treatment  Patient Details  Name: Matthew [name] MRN: [id number] Date of Birth: [birth year] Referring Provider: Arnoldo Morale, MD  Encounter Date: 03/22/2017      PT End of Session - 03/22/17 1510    Visit Number 1  visit cancelled due to BP   Number of Visits 9   Date for PT Re-Evaluation 04/12/17   Authorization Type UHC 30 visit limit per discipline   Authorization - Visit Number 1   Authorization - Number of Visits 30   PT Start Time 0623   PT Stop Time 7628  no charge due to medical issues   PT Time Calculation (min) 11 min   Activity Tolerance Treatment limited secondary to medical complications (Comment)   Behavior During Therapy [facility] for tasks assessed/performed      Past Medical History:  Diagnosis Date  . Acute combined systolic and diastolic CHF, NYHA class 3 ([place])    a. 02/2014 Echo: EF 25-30%.  . Cardiomyopathy ([place])    a. 02/2014 Echo: EF 25-30%, sev glob HK with inferolat HK->AK, mod conc LVH, Gr 2 DD, Mild MR, sev dil LA.  Marland Kitchen Hypertension   . Morbid obesity ([place])   . Tobacco abuse     Past Surgical History:  Procedure Laterality Date  . CATARACT EXTRACTION Right   . LEFT HEART CATHETERIZATION WITH CORONARY ANGIOGRAM N/A 03/04/2014   Procedure: LEFT HEART CATHETERIZATION WITH CORONARY ANGIOGRAM;  Surgeon: Burnell Blanks, MD;  [facility]: [facility] CATH LAB;  Service: Cardiovascular;  Laterality: N/A;  . TEE WITHOUT CARDIOVERSION N/A 02/04/2017   Procedure: TRANSESOPHAGEAL ECHOCARDIOGRAM (TEE);  Surgeon: Dorothy Spark, MD;  Location: [facility];  Service: Cardiovascular;  Laterality: N/A;    Vitals:   03/22/17 1448 03/22/17 1510  BP: (!) 180/104 (!) 180/110        Subjective Assessment - 03/22/17 1451    Subjective No new complaints. No falls or pain to report. Not sure about BP,  not monitoring at home every day.    Limitations House hold activities;Walking   Patient Stated Goals "I want to get my speech and my balance back like it was."    Currently in Pain? No/denies            PT Short Term Goals - 02/11/17 1051      PT SHORT TERM GOAL #1   Title Pt will initiate HEP in order to indicate improved functional mobility and improved balance.  (Target Date: 03/11/17)   Time 4   Period Weeks   Status New     PT SHORT TERM GOAL #2   Title Will assess 6MWT and improve distance by 150' in order to indicate improved functional endurance.     Time 4   Period Weeks   Status New     PT SHORT TERM GOAL #3   Title Will assess FGA and improve score by 2 points from baseline in order to indicate improved balance and decreased fall risk.     Time 4   Period Weeks   Status New     PT SHORT TERM GOAL #4   Title Pt will ambulate 500' over unlevel paved surfaces (including ramp and curb) at mod I level in order to indicate safe return to community.     Time 4   Period Weeks   Status New  PT Long Term Goals - 02/11/17 1054      PT LONG TERM GOAL #1   Title Pt will be independent with HEP in order to indicate improved functional endurance and balance.  (Target Date: 04/08/17)   Time 8   Period Weeks   Status New     PT LONG TERM GOAL #2   Title Pt will improve 6MWT by 300' from baseline in order to indicate improved functional endurance.     Time 8   Period Weeks   Status New     PT LONG TERM GOAL #3   Title Pt will improve FGA by 4 points from baseline in order to indicate decreased fall risk.    Time 8   Period Weeks   Status New     PT LONG TERM GOAL #4   Title Pt will be able to perform work simulated tasks without overt LOB at an independent level in order to indicate safe return to work.     Time 8   Period Weeks   Status New     PT LONG TERM GOAL #5   Title Pt will ambulate 1000' over varying outdoor surfaces at an independent  level in order to indicate safe return to community and leisure activities.     Time 8   Period Weeks   Status New           Plan - 03/22/17 1511    Clinical Impression Statement Pt presented to PT today with elevated BP's x 2 takes with different clinicians. Pt reports he takes his meds as he is suppossed too and that he follows all diet restrictions. All of pt's PT/ST appts have been removed until pt can obtain medical clearance to return with more controlled BP readings. Pt has a follow up MD appt next week, 03/26/17. Pt reminder of signs/symptoms of CVA and to call 911 if any occur. Pt and daughter (who accompanied Pt) verbalized understanding.                     Rehab Potential Good   Clinical Impairments Affecting Rehab Potential co-morbidities, high co pay   PT Frequency 1x / week   PT Duration 8 weeks   PT Treatment/Interventions ADLs/Self Care Home Management;DME Instruction;Gait training;Stair training;Functional mobility training;Therapeutic activities;Therapeutic exercise;Balance training;Neuromuscular re-education;Patient/family education;Orthotic Fit/Training;Energy conservation;Vestibular   PT Next Visit Plan Pt on hold until he obtains medical clearance to resume therapies with improved BP control. All appt's have been cancelled at this time. Pt is aware he needs to call and reschedule when medically cleared to return.    Consulted and Agree with Plan of Care Patient      Patient will benefit from skilled therapeutic intervention in order to improve the following deficits and impairments:  Abnormal gait, Decreased activity tolerance, Decreased balance, Decreased coordination, Decreased endurance, Decreased mobility, Decreased strength, Dizziness, Impaired perceived functional ability, Impaired sensation, Postural dysfunction  Visit Diagnosis: Unsteadiness on feet  Hemiplegia and hemiparesis following other cerebrovascular disease affecting right dominant side  (HCC)  Muscle weakness (generalized)  Other abnormalities of gait and mobility     Problem List Patient Active Problem List   Diagnosis Date Noted  . Expressive aphasia 02/20/2017  . Mixed hyperlipidemia   . CVA (cerebral vascular accident) ([place]) 01/31/2017  . Erectile dysfunction 07/30/2016  . OSA (obstructive sleep apnea) 05/10/2015  . CKD (chronic kidney disease) stage 2, GFR 60-89 ml/min 03/10/2014  . Diabetes mellitus type II, controlled ([place])  03/10/2014  . Chronic combined systolic and diastolic congestive heart failure ([place]) 03/03/2014  . Smoker   . Morbid obesity ([place])   . Hypertension 03/02/2014    Willow Ora, PTA, [facility] [address] [place], [place] [address] [phone] 03/22/17, 3:16 PM   Name: Michaell Grider MRN: [id number] Date of Birth: Apr 17, 1966

## 2017-03-26 ENCOUNTER — Ambulatory Visit: Payer: 59 | Admitting: Family Medicine

## 2017-03-29 ENCOUNTER — Ambulatory Visit: Payer: 59 | Admitting: Rehabilitation

## 2017-03-29 ENCOUNTER — Ambulatory Visit: Payer: 59

## 2017-04-03 ENCOUNTER — Ambulatory Visit: Payer: 59

## 2017-04-03 ENCOUNTER — Ambulatory Visit: Payer: 59 | Admitting: Physical Therapy

## 2017-04-03 MED FILL — glipiZIDE 5 MG TABS: 5 | 30 days supply | Qty: 60 | Fill #0

## 2017-04-05 ENCOUNTER — Ambulatory Visit: Payer: 59 | Admitting: Rehabilitation

## 2017-04-10 ENCOUNTER — Encounter: Payer: Self-pay | Admitting: Neurology

## 2017-04-10 ENCOUNTER — Ambulatory Visit (INDEPENDENT_AMBULATORY_CARE_PROVIDER_SITE_OTHER): Payer: 59 | Admitting: Neurology

## 2017-04-10 VITALS — BP 163/101 | HR 66 | Ht 69.0 in | Wt 256.2 lb

## 2017-04-10 DIAGNOSIS — E1121 Type 2 diabetes mellitus with diabetic nephropathy: Secondary | ICD-10-CM | POA: Diagnosis not present

## 2017-04-10 DIAGNOSIS — I6930 Unspecified sequelae of cerebral infarction: Secondary | ICD-10-CM

## 2017-04-10 DIAGNOSIS — I10 Essential (primary) hypertension: Secondary | ICD-10-CM

## 2017-04-10 DIAGNOSIS — E119 Type 2 diabetes mellitus without complications: Secondary | ICD-10-CM

## 2017-04-10 DIAGNOSIS — E782 Mixed hyperlipidemia: Secondary | ICD-10-CM | POA: Diagnosis not present

## 2017-04-10 MED ORDER — CARVEDILOL 6.25 MG PO TABS: 6.2500 mg | ORAL_TABLET | Freq: Two times a day (BID) | ORAL | 5 refills | Status: DC

## 2017-04-10 MED ORDER — EZETIMIBE 10 MG PO TABS: 10.0000 mg | ORAL_TABLET | Freq: Every day | ORAL | 5 refills | Status: DC

## 2017-04-10 MED ORDER — SIMVASTATIN 20 MG PO TABS: 20.0000 mg | ORAL_TABLET | Freq: Every day | ORAL | 5 refills | Status: DC

## 2017-04-10 MED ORDER — FUROSEMIDE 40 MG PO TABS: 40.0000 mg | ORAL_TABLET | Freq: Every day | ORAL | 0 refills | Status: DC

## 2017-04-10 MED ORDER — SIMVASTATIN 20 MG PO TABS: 20.0000 mg | ORAL_TABLET | Freq: Every day | ORAL | 0 refills | Status: DC

## 2017-04-10 MED ORDER — FUROSEMIDE 40 MG PO TABS: 40.0000 mg | ORAL_TABLET | Freq: Every day | ORAL | 5 refills | Status: DC

## 2017-04-10 MED ORDER — HYDRALAZINE HCL 25 MG PO TABS: 25.0000 mg | ORAL_TABLET | Freq: Three times a day (TID) | ORAL | 5 refills | Status: DC

## 2017-04-10 MED ORDER — GLIPIZIDE 5 MG PO TABS: 5.0000 mg | ORAL_TABLET | Freq: Two times a day (BID) | ORAL | 3 refills | Status: DC

## 2017-04-10 MED FILL — EZETIMIBE 10 MG TABLET: 10 | 30 days supply | Qty: 30 | Fill #0

## 2017-04-10 MED FILL — SIMVASTATIN 20 MG TABLET: 20 | 30 days supply | Qty: 30 | Fill #0

## 2017-04-10 MED FILL — hydrALAZINE HCL 25 MG TABS: 25 | 30 days supply | Qty: 90 | Fill #0

## 2017-04-10 MED FILL — CARVEDILOL 6.25 MG TABLET: 6.25 | 30 days supply | Qty: 60 | Fill #0

## 2017-04-10 MED FILL — FUROSEMIDE 40 MG TABLET: 40 | 30 days supply | Qty: 30 | Fill #0

## 2017-04-10 NOTE — Patient Instructions (Signed)
Remember to drink plenty of fluid, eat healthy meals and do not skip any meals. Try to eat protein with a every meal and eat a healthy snack such as fruit or nuts in between meals. Try to keep a regular sleep-wake schedule and try to exercise daily, particularly in the form of walking, 20-30 minutes a day, if you can.   As far as your medications are concerned, I would like to suggest: continue  I would like to see you back in 6 months, sooner if we need to. Please call us with any interim questions, concerns, problems, updates or refill requests.   Our phone number is [phone]. We also have an after hours call service for urgent matters and there is a physician on-call for urgent questions. For any emergencies you know to call 911 or go to the nearest emergency room

## 2017-04-10 NOTE — Progress Notes (Signed)
[facility]    Provider:  Dr Jaynee Eagles Referring Provider: Arnoldo Morale, MD Primary Care Physician:  Arnoldo Morale, MD  CC:  Stroke  HPI:  Matthew [name] is a 51 y.o. male here as a referral from Dr. Jarold Song for Stroke. Patient ran out of his blood pressure medications (maybe his others, he does not know) and has not taken them in a few weeks. I will refill and he needs to see pcp for further management.  Patient presented to [facility] hospital mid February 2018 with decreased sensation of the bilateral legs as well as weakness which progressed to include tingling in both hands and then dysarthria. Patient improved in the hospital. MRI showed bilateral ACA infarcts, embolic pattern. Patient returns today for follow-up. Source was likely embolic (unknown source) however patient does have many risk factors including smoking, type 2 diabetes, hypertension and elevated cholesterol and triglycerides, obesity, OSA, CHF. He has been undergoing physical therapy, occupational therapy and speech therapy. 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 He still complains of tingling in the hands, speech difficulty and wobbly gait. His blood pressure has been elevated recently and he has been working with his primary care.  He feels he is better, still difficulty with speaking just a little, still feels weak in the lowers, still some tingling in the fingers but improved.   Reviewed notes, labs and imaging from outside physicians, which showed:  IMAGING I have personally reviewed the radiological images below and agree with the radiology interpretations.  Dg Chest 2 View 01/31/2017 1. Mild cardiomegaly, no pulmonary venous could. 2. No acute pulmonary disease.   Ct Head Wo Contrast 01/31/2017 Abnormal low-density in the left corpus callosum and pericallosal deep white matter that could represent recent left ACA infarction.   Mr Brain 48 Contrast Mr Angiogram Head Wo Contrast 01/31/2017 1.  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 2. Additional small left ACA/watershed infarcts. 3. Punctate acute right parietal infarct. 4. 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 5. Diminished flow versus occlusion of the distal right A2 segment.   TTE - Left ventricle: The cavity size was mildly dilated. Wall thickness was normal. Systolic function was mildly reduced. The estimated ejection fraction was in the range of 45% to 50%. Features are consistent with a pseudonormal left ventricular filling pattern, with concomitant abnormal relaxation and increased filling pressure (grade 2 diastolic dysfunction). - Aortic valve: Valve area (VTI): 4.14 cm^2. Valve area (Vmax): 4.18 cm^2. Valve area (Vmean): 3.78 cm^2.  CUS - Findings consistent with a 1-39 percent stenosis involving the right internal carotid artery and the left internal carotid artery. Intimal thickening bilaterally with mild soft plaque in the proximal left internal carotid artery. Bilateral vertebral arteries are patent and antegrade.   Review of Systems: Patient complains of symptoms per HPI as well as the following symptoms: blurred vision, SOB, increased thirst, aching muscles. Pertinent negatives per HPI. All others negative.   Social History   Social History  . Marital status: Married    Spouse name: N/A  . Number of children: 2  . Years of education: 14   Occupational History  . [employer]    Social History Main Topics  . Smoking status: Former Smoker    Years: 20.00    Types: Cigarettes    Quit date: 03/02/2014  . Smokeless tobacco: Never Used  . Alcohol use No  . Drug use: No  . Sexual activity: Yes  Other Topics Concern  . Not on file   Social History Narrative   Lives with wife and 2 young children in [place].  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   Left-handed   Caffeine: occasional tea    Family History    Problem Relation Age of Onset  . Heart attack Father     died @ 49  . Cervical cancer Mother     died @ 10    Past Medical History:  Diagnosis Date  . Acute combined systolic and diastolic CHF, NYHA class 3 ([place])    a. 02/2014 Echo: EF 25-30%.  . Cardiomyopathy ([place])    a. 02/2014 Echo: EF 25-30%, sev glob HK with inferolat HK->AK, mod conc LVH, Gr 2 DD, Mild MR, sev dil LA.  . Depression   . DM (diabetes mellitus) ([place])   . High cholesterol   . Hypertension   . Morbid obesity ([place])   . Tobacco abuse     Past Surgical History:  Procedure Laterality Date  . CATARACT EXTRACTION Right   . LEFT HEART CATHETERIZATION WITH CORONARY ANGIOGRAM N/A 03/04/2014   Procedure: LEFT HEART CATHETERIZATION WITH CORONARY ANGIOGRAM;  Surgeon: Burnell Blanks, MD;  Location: [facility] CATH LAB;  Service: Cardiovascular;  Laterality: N/A;  . TEE WITHOUT CARDIOVERSION N/A 02/04/2017   Procedure: TRANSESOPHAGEAL ECHOCARDIOGRAM (TEE);  Surgeon: Dorothy Spark, MD;  Location: [facility] ENDOSCOPY;  Service: Cardiovascular;  Laterality: N/A;    Current Outpatient Prescriptions  Medication Sig Dispense Refill  . aspirin 325 MG tablet Take 1 tablet (325 mg total) by mouth daily. 30 tablet 5  . carvedilol (COREG) 6.25 MG tablet Take 1 tablet (6.25 mg total) by mouth 2 (two) times daily with a meal. 60 tablet 5  . ezetimibe (ZETIA) 10 MG tablet Take 1 tablet (10 mg total) by mouth daily. 30 tablet 5  . furosemide (LASIX) 40 MG tablet Take 1 tablet (40 mg total) by mouth daily. 30 tablet 0  . glipiZIDE (GLUCOTROL) 5 MG tablet Take 1 tablet (5 mg total) by mouth 2 (two) times daily before a meal. 60 tablet 3  . glucose blood (ONE TOUCH ULTRA TEST) test strip Use as instructed 30 each 12  . hydrALAZINE (APRESOLINE) 25 MG tablet Take 1 tablet (25 mg total) by mouth 3 (three) times daily. 90 tablet 5  . simvastatin (ZOCOR) 20 MG tablet Take 1 tablet (20 mg total) by mouth at bedtime. 30 tablet 0  . acetaminophen  (TYLENOL) 500 MG tablet Take 500 mg by mouth every 6 (six) hours as needed for mild pain.     No current facility-administered medications for this visit.     Allergies as of 04/10/2017  . (No Known Allergies)    Vitals: BP (!) 163/101   Pulse 66   Ht 5\' 9"  (1.753 m)   Wt 256 lb 3.2 oz (116.2 kg)   BMI 37.83 kg/m  Last Weight:  Wt Readings from Last 1 Encounters:  04/10/17 256 lb 3.2 oz (116.2 kg)   Last Height:   Ht Readings from Last 1 Encounters:  04/10/17 5\' 9"  (1.753 m)   Physical exam: Exam: Gen: NAD, conversant, well nourised, obese, well groomed                     CV: RRR, no MRG. No Carotid Bruits. No peripheral edema, warm, nontender Eyes: Conjunctivae clear without exudates or hemorrhage  Neuro: Detailed Neurologic Exam  Speech:  Speech is mildly dysarthric; fluent and spontaneous with normal comprehension.  Cognition:    The patient is oriented to person, place, and time;   Cranial Nerves:    The pupils are equal, round, and reactive to light. Visual fields are full to finger confrontation. Extraocular movements are intact. Trigeminal sensation is intact and the muscles of mastication are normal. The face is symmetric. The palate elevates in the midline. Hearing intact. Voice is normal. Shoulder shrug is normal. The tongue has normal motion without fasciculations.    Gait:    Heel-toe and tandem with some imbalance .   Motor Observation:    No asymmetry, no atrophy, and no involuntary movements noted. Tone:    Normal muscle tone.    Posture:    Posture is normal. normal erect    Strength: He has some LE generalized weakness 4-4+/5 with intact strength distally. Otherwise strength is V/V in the upper and lower limbs.      Sensation: intact to L     ASSESSMENT/PLAN: Mr. Kalen Ratajczak is a 51 y.o. male with history of combined systolic and diastolic CHF with EF 51-02%, HTN, obesity, DM, HLD presenting with Bilateral leg weakness and tingling in  hands and legs along with R facial decreased movement. He did not receive IV t-PA due to being out of the window.    - He is improving in balance and strength. Continue PT/OT/speech. He quit smoking. He is on short-term disability. He is taking his ASA daily, ran out of other meds. Goes to PT and exercising more, endorses fatigue.   Patient has been out of meds for several weeks, I discussed compliance and the importance of taking his meds. Refilled them all. Needs to follow with pcp closely.   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 However, pt doses have stroke risk factor including DM, obesity, HLD, smoking  Resultant  BLE weakness near baseline now  CT head low-density L corpus callosum   MRI  left greater than right corpus callosum infarct. Small L ACA/watershed infarct. Punctate R parietal infarct.  MRA  L ACA occlusion and A2. Diminished flow versus occlusion R A2.  Carotid Doppler  unremarkable  2D Echo  EF 45-50%  LE venous Doppler   LDL UTC, high TG  HgbA1c 7.1   No antithrombotic prior to admission, now on aspirin 325 mg daily.   Cc: Arnoldo Morale, MD   I had a long d/w patient about recent stroke, risk for recurrent stroke/TIAs, personally independently reviewed imaging studies and stroke evaluation results and answered questions.Continue ASA for secondary stroke prevention and maintain strict control of hypertension with blood pressure goal below 130/90, diabetes with hemoglobin A1c goal below 6.5% and lipids with LDL cholesterol goal below 70 mg/dL. I also recommended abstinence from cigarettes. And advised the patient to eat a healthy diet with plenty of whole grains, cereals, fruits and vegetables, exercise regularly and maintain ideal body weight..Followup in the future with me in 6 months.   A total of 25 minutes was spent face-to-face with this patient. Over half this time was spent on  counseling patient on the bilateral ACA stroke, cryptogenic, diagnosis and different diagnostic and therapeutic options available.

## 2017-04-10 NOTE — Addendum Note (Signed)
Addended by: Sarina Ill B on: 04/10/2017 02:06 PM   Modules accepted: Orders

## 2017-04-12 ENCOUNTER — Ambulatory Visit: Payer: 59 | Admitting: *Deleted

## 2017-04-12 ENCOUNTER — Ambulatory Visit: Payer: 59 | Admitting: Rehabilitation

## 2017-04-19 ENCOUNTER — Ambulatory Visit: Payer: 59

## 2017-04-19 ENCOUNTER — Ambulatory Visit: Payer: 59 | Attending: Internal Medicine | Admitting: Physical Therapy

## 2017-04-22 ENCOUNTER — Encounter: Payer: Self-pay | Admitting: Family Medicine

## 2017-04-22 ENCOUNTER — Ambulatory Visit: Payer: 59 | Attending: Family Medicine | Admitting: Family Medicine

## 2017-04-22 VITALS — BP 157/99 | HR 69 | Temp 98.1°F | Resp 18 | Ht 68.0 in | Wt 250.0 lb

## 2017-04-22 DIAGNOSIS — I5042 Chronic combined systolic (congestive) and diastolic (congestive) heart failure: Secondary | ICD-10-CM

## 2017-04-22 DIAGNOSIS — I110 Hypertensive heart disease with heart failure: Secondary | ICD-10-CM | POA: Insufficient documentation

## 2017-04-22 DIAGNOSIS — E1121 Type 2 diabetes mellitus with diabetic nephropathy: Secondary | ICD-10-CM | POA: Diagnosis not present

## 2017-04-22 DIAGNOSIS — I10 Essential (primary) hypertension: Secondary | ICD-10-CM

## 2017-04-22 DIAGNOSIS — E782 Mixed hyperlipidemia: Secondary | ICD-10-CM | POA: Diagnosis not present

## 2017-04-22 DIAGNOSIS — I6300 Cerebral infarction due to thrombosis of unspecified precerebral artery: Secondary | ICD-10-CM | POA: Diagnosis not present

## 2017-04-22 DIAGNOSIS — E119 Type 2 diabetes mellitus without complications: Secondary | ICD-10-CM | POA: Diagnosis present

## 2017-04-22 DIAGNOSIS — R4701 Aphasia: Secondary | ICD-10-CM

## 2017-04-22 LAB — GLUCOSE, POCT (MANUAL RESULT ENTRY): POC Glucose: 222 mg/dl — AB (ref 70–99)

## 2017-04-22 LAB — POCT GLYCOSYLATED HEMOGLOBIN (HGB A1C): Hemoglobin A1C: 6.6

## 2017-04-22 NOTE — Progress Notes (Signed)
Subjective:    Patient ID: Matthew [name], male    DOB: 1966/10/12, 51 y.o.   MRN: [id number]  HPI Jaskarn Schweer is a 51 year old male with a history of type 2 diabetes mellitus (A1c 6.6), hypertension, previous tobacco abuse (quit a couple of weeks ago), Recent history of stroke who presents for follow-up visit.   He had presented to the ED with bilateral lower extremity weakness, dysarthria, tingling in his hand. CT head revealed abnormal low density in the left corpus callosum and pericallosal deep white matter that could represent recent left ACA infarction.  MRI; Acute infarction involving a large portion of the corpus callosum, left greater than right. Additional small left ACA/watershed infarcts. Punctate acute right parietal infarct. 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 He did not receive TPA due to being outside the window. TEE was negative for embolic source; 2-D echo revealed EF of 45-50%, carotid Dopplers were negative. His lisinopril, Imdur, spironolactone were discontinued and Zetia was added to regimen.  Since discharge he has been undergoing PT/OT/ST but this was placed on hold recently due to his elevated blood pressures. His dose of hydralazine had been increased from 10 mg to 25 mg at his last visit however he never commenced the new dose.  He was seen by neurology 2 weeks ago. He complains of tingling in fingers, but his speech and gait have improved. He will need a short-term disability form.   Past Medical History:  Diagnosis Date  . Acute combined systolic and diastolic CHF, NYHA class 3 ([place])    a. 02/2014 Echo: EF 25-30%.  . Cardiomyopathy ([place])    a. 02/2014 Echo: EF 25-30%, sev glob HK with inferolat HK->AK, mod conc LVH, Gr 2 DD, Mild MR, sev dil LA.  . Depression   . DM (diabetes mellitus) ([place])   . High cholesterol   . Hypertension   . Morbid obesity ([place])   . Tobacco abuse     Past Surgical History:  Procedure Laterality Date  . CATARACT EXTRACTION  Right   . LEFT HEART CATHETERIZATION WITH CORONARY ANGIOGRAM N/A 03/04/2014   Procedure: LEFT HEART CATHETERIZATION WITH CORONARY ANGIOGRAM;  Surgeon: Burnell Blanks, MD;  [facility] C[facility] LAB;  Service: Cardiovascular;  Laterality: N/A;  . TEE WITHOUT CARDIOVERSION N/A 02/04/2017   Procedure: TRANSESOPHAGEAL ECHOCARDIOGRAM (TEE);  Surgeon: Dorothy Spark, MD;  Location: [facility] ENDOSCOPY;  Service: Cardiovascular;  Laterality: N/A;    No Known Allergies   Review of Systems Constitutional: Negative for activity change and appetite change.  HENT: Negative for sinus pressure and sore throat.   Eyes: Negative for visual disturbance.  Respiratory: Negative for cough, chest tightness and shortness of breath.   Cardiovascular: Negative for chest pain and leg swelling.  Gastrointestinal: Negative for abdominal distention, abdominal pain, constipation and diarrhea.  Endocrine: Negative.   Genitourinary: Negative for dysuria.  Musculoskeletal: Negative for joint swelling and myalgias.  Skin: Negative for rash.  Allergic/Immunologic: Negative.   Neurological: Positive for speech difficulty and weakness. Negative for light-headedness and numbness.  Psychiatric/Behavioral: Negative for dysphoric mood and suicidal ideas.     Objective: Vitals:   04/22/17 1348  BP: (!) 157/99  Pulse: 69  Resp: 18  Temp: 98.1 F (36.7 C)  TempSrc: Oral  SpO2: 99%  Weight: 250 lb (113.4 kg)  Height: 5\' 8"  (1.727 m)      Physical Exam Constitutional: He is oriented to person, place, and time. He appears well-developed and well-nourished.  Cardiovascular: Normal rate,  normal heart sounds and intact distal pulses.   No murmur heard. Pulmonary/Chest: Effort normal and breath sounds normal. He has no wheezes. He has no rales. He exhibits no tenderness.  Abdominal: Soft. Bowel sounds are normal. He exhibits no distension and no mass. There is no tenderness.  Musculoskeletal: Normal range of motion.    Neurological: He is alert and oriented to person, place, and time. A cranial nerve deficit (left facial nerve palsy) is present. Gait abnormal.  Motor strength: Left upper extremity - 4/5; left lower extremity -4+/5 Right upper extremity- 5/5; right lower extremity -5/5  Skin: Skin is warm and dry.  Psychiatric: He has a normal mood and affect       Assessment & Plan:  1. Controlled type 2 diabetes mellitus with diabetic nephropathy, without long-term current use of insulin (HCC) Controlled with A1c of 7.1 Continue glipizide Diabetic diet - Glucose (CBG)  2. Morbid obesity (HCC) Reduce portion sizes, weight loss, increase physical activity  3. Mixed hyperlipidemia Currently on statin We'll consider increasing dose of simvastatin at next visit due to his increased cardiovascular risk Low-cholesterol diet - ezetimibe (ZETIA) 10 MG tablet; Take 1 tablet (10 mg total) by mouth daily.  Dispense: 30 tablet; Refill: 5  4. Chronic combined systolic and diastolic congestive heart failure (HCC) EF of 40-45% from 2-D echo of 01/2017 Euvolemic  5. Cerebrovascular accident (CVA) due to thrombosis of precerebral artery ([place]) With left hemiparesis and expressive aphasia - improving We'll complete short-term disability putting him out for the next three months Risk factor modification Continue PT/OT/ST - aspirin 325 MG tablet; Take 1 tablet (325 mg total) by mouth daily.  Dispense: 30 tablet; Refill: 5   6. Expressive aphasia Continue speech therapy  7. Essential hypertension Uncontrolled Raised dose of hydralazine; advised to commence new dose Low-sodium diet - hydrALAZINE (APRESOLINE) 25 MG tablet; Take 1 tablet (25 mg total) by mouth 3 (three) times daily.  Dispense: 90 tablet; Refill: 5

## 2017-04-22 NOTE — Progress Notes (Signed)
Patient is here for CVA  Patient has taken medication today Patient has eaten today.  Patient states he has not taken hydralazine in one week.  Patient denies having a plan to end his life at this time.

## 2017-04-23 LAB — COMPREHENSIVE METABOLIC PANEL
ALT: 20 IU/L (ref 0–44)
AST: 19 IU/L (ref 0–40)
Albumin/Globulin Ratio: 1.6 (ref 1.2–2.2)
Albumin: 4.3 g/dL (ref 3.5–5.5)
Alkaline Phosphatase: 68 IU/L (ref 39–117)
BUN/Creatinine Ratio: 9 (ref 9–20)
BUN: 12 mg/dL (ref 6–24)
Bilirubin Total: 0.3 mg/dL (ref 0.0–1.2)
CO2: 24 mmol/L (ref 18–29)
Calcium: 9.4 mg/dL (ref 8.7–10.2)
Chloride: 96 mmol/L (ref 96–106)
Creatinine, Ser: 1.34 mg/dL — ABNORMAL HIGH (ref 0.76–1.27)
GFR calc Af Amer: 71 mL/min/{1.73_m2} (ref >59)
GFR calc non Af Amer: 61 mL/min/{1.73_m2} (ref >59)
Globulin, Total: 2.7 g/dL (ref 1.5–4.5)
Glucose: 177 mg/dL — ABNORMAL HIGH (ref 65–99)
Potassium: 3.8 mmol/L (ref 3.5–5.2)
Sodium: 139 mmol/L (ref 134–144)
Total Protein: 7 g/dL (ref 6.0–8.5)

## 2017-04-25 ENCOUNTER — Telehealth: Payer: Self-pay

## 2017-04-25 NOTE — Telephone Encounter (Signed)
Clld pt - Advsd of lab results. Pt stated he understood.

## 2017-04-25 NOTE — Telephone Encounter (Signed)
-----   Message from Arnoldo Morale, MD sent at 04/23/2017  1:34 PM EDT ----- Labs are stable

## 2017-04-26 ENCOUNTER — Telehealth: Payer: Self-pay | Admitting: Family Medicine

## 2017-04-26 DIAGNOSIS — I6300 Cerebral infarction due to thrombosis of unspecified precerebral artery: Secondary | ICD-10-CM

## 2017-04-26 NOTE — Telephone Encounter (Signed)
Referral placed.

## 2017-04-26 NOTE — Telephone Encounter (Signed)
PT called to find out if you sent another referal for SPEECH THERAPY AND PHYSICAL THERAPY, since the last visit he was told that we will sent a new referral to be see , please follow up

## 2017-04-26 NOTE — Telephone Encounter (Signed)
Patient cancelled his appointments in March and is now requesting a new referral be sent. Please advise.

## 2017-05-02 ENCOUNTER — Ambulatory Visit: Payer: 59 | Admitting: Speech Pathology

## 2017-05-22 MED FILL — EZETIMIBE 10 MG TABLET: 10 | 30 days supply | Qty: 30 | Fill #1

## 2017-05-22 MED FILL — hydrALAZINE HCL 25 MG TABS: 25 | 30 days supply | Qty: 90 | Fill #1

## 2017-05-22 MED FILL — FUROSEMIDE 40 MG TABLET: 40 | 30 days supply | Qty: 30 | Fill #1

## 2017-05-22 MED FILL — CARVEDILOL 6.25 MG TABLET: 6.25 | 30 days supply | Qty: 60 | Fill #1

## 2017-05-22 MED FILL — SIMVASTATIN 20 MG TABLET: 20 | 30 days supply | Qty: 30 | Fill #1

## 2017-05-22 MED FILL — glipiZIDE 5 MG TABS: 5 | 30 days supply | Qty: 60 | Fill #1

## 2017-05-24 ENCOUNTER — Ambulatory Visit: Payer: 59 | Admitting: Rehabilitation

## 2017-05-24 ENCOUNTER — Ambulatory Visit: Payer: 59

## 2017-06-06 ENCOUNTER — Ambulatory Visit: Payer: 59 | Attending: Internal Medicine | Admitting: Speech Pathology

## 2017-06-06 ENCOUNTER — Ambulatory Visit: Payer: 59 | Admitting: Physical Therapy

## 2017-06-06 VITALS — BP 172/109

## 2017-06-06 DIAGNOSIS — R2689 Other abnormalities of gait and mobility: Secondary | ICD-10-CM

## 2017-06-06 DIAGNOSIS — I69390 Apraxia following cerebral infarction: Secondary | ICD-10-CM | POA: Insufficient documentation

## 2017-06-06 NOTE — Therapy (Signed)
[facility] [address] [place], [place], [address] Phone: [phone]   Fax:  [phone]  Physical Therapy Evaluation  Patient Details  Name: Matthew [name] MRN: [id number] Date of Birth: 14-Apr-1966 Referring Provider: Arnoldo Morale, MD  Encounter Date: 06/06/2017      PT End of Session - 06/06/17 2153    Visit Number 1  new episode of care   Number of Visits 1  eval only   PT Start Time 1018   PT Stop Time 1100   PT Time Calculation (min) 42 min   Behavior During Therapy [facility] for tasks assessed/performed      Past Medical History:  Diagnosis Date  . Acute combined systolic and diastolic CHF, NYHA class 3 ([place])    a. 02/2014 Echo: EF 25-30%.  . Cardiomyopathy ([place])    a. 02/2014 Echo: EF 25-30%, sev glob HK with inferolat HK->AK, mod conc LVH, Gr 2 DD, Mild MR, sev dil LA.  . Depression   . DM (diabetes mellitus) ([place])   . High cholesterol   . Hypertension   . Morbid obesity ([place])   . Tobacco abuse     Past Surgical History:  Procedure Laterality Date  . CATARACT EXTRACTION Right   . LEFT HEART CATHETERIZATION WITH CORONARY ANGIOGRAM N/A 03/04/2014   Procedure: LEFT HEART CATHETERIZATION WITH CORONARY ANGIOGRAM;  Surgeon: Burnell Blanks, MD;  Location: [facility] C[facility] LAB;  Service: Cardiovascular;  Laterality: N/A;  . TEE WITHOUT CARDIOVERSION N/A 02/04/2017   Procedure: TRANSESOPHAGEAL ECHOCARDIOGRAM (TEE);  Surgeon: Dorothy Spark, MD;  Location: [facility];  Service: Cardiovascular;  Laterality: N/A;    Vitals:   06/06/17 1025 06/06/17 1055  BP: (!) 166/95 (!) 172/109         Subjective Assessment - 06/06/17 1024    Subjective Reports taking all his blood pressure medication now and BP readings at home being better.  Feel I'm a lot better than what I was.  As far as my balance, I feel fine.  No falls.   Patient is accompained by: Family member  daughter   Pertinent History DM, HTN,  obesity, CVA 01/2017   Patient Stated Goals "I was more concerned about speech therapy than anything else."   Currently in Pain? No/denies            [facility] PT Assessment - 06/06/17 1034      Assessment   Medical Diagnosis CV A   Referring Provider Arnoldo Morale, MD   Onset Date/Surgical Date 01/31/17     Precautions   Precautions Fall     Balance Screen   Has the patient fallen in the past 6 months No   Has the patient had a decrease in activity level because of a fear of falling?  No   Is the patient reluctant to leave their home because of a fear of falling?  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 (on feet, lifts up to 50 lbs, sometimes step stools)   Leisure [place], watches sports     ROM / Strength   AROM / PROM / Strength Strength     Strength   Overall Strength Comments Grossly tested at least 4/5 bilateral lower extremities     Transfers   Transfers Sit to Stand;Stand to Sit   Sit to Stand 7: Independent   Five time sit to stand comments  16.87   Stand to Sit 7: Independent     Ambulation/Gait   Ambulation/Gait Yes   Ambulation/Gait Assistance 7: Independent   Ambulation Distance (Feet) 200 Feet   Assistive device None   Gait Pattern Step-through pattern;Wide base of support   Ambulation Surface Level;Indoor   Gait velocity 9.06 sec= 3.62 ft/sec     Standardized Balance Assessment   Standardized Balance Assessment Timed Up  and Go Test;Dynamic Gait Index     Timed Up and Go Test   Normal TUG (seconds) 11.47   TUG Comments Scores >13.5 seconds indicate increased fall risk.     Functional Gait  Assessment   Gait assessed  Yes   Gait Level Surface Walks 20 ft in less than 7 sec but greater than 5.5 sec, uses assistive device, slower speed, mild gait deviations, or deviates 6-10 in outside of the 12 in walkway width.   Change in Gait Speed Able to smoothly change walking speed without loss of balance or gait deviation. Deviate no more than 6 in outside of the 12 in walkway width.   Gait with Horizontal Head Turns Performs head turns smoothly with no change in gait. Deviates no more than 6 in outside 12 in walkway width   Gait with Vertical Head Turns Performs head turns with no change in gait. Deviates no more than 6 in outside 12 in walkway width.   Gait and Pivot Turn Pivot turns safely within 3 sec and stops quickly with no loss of balance.   Step Over Obstacle Is able to step over one shoe box (4.5 in total height) without changing gait speed. No evidence of imbalance.   Gait with Narrow Base of Support Ambulates 4-7 steps.   Gait with Eyes Closed Walks 20 ft, uses assistive device, slower speed, mild gait deviations, deviates 6-10 in outside 12 in walkway width. Ambulates 20 ft in less than 9 sec but greater than 7 sec.   Ambulating Backwards Walks 20 ft, no assistive devices, good speed, no evidence for imbalance, normal gait   Steps Alternating feet, no rail.   Total Score 25   FGA comment: Scores <22/30 indicate increased fall risk.            Objective measurements completed on examination: See above findings.                    PT Short Term Goals - 06/06/17 2203      PT SHORT TERM GOAL #1   Title No STGS-eval only           PT Long Term Goals - 06/06/17 2203      PT LONG TERM GOAL #1   Title No LTGs-eval only                Plan - 06/06/17 2154    Clinical  Impression Statement Pt presents to OP PT today for evaluation, following being away from therapy since 04/11/17.  Pt had been previously evaluated by PT 01/2017 following CVA,  but was unable to be seen due to ongoing elevated blood pressure.  Pt returns today for evaluations, reporting his main concern continues to be speech , and he feels he has improved functional mobilitya nd does not need PT.  He agrees to proceed today, and objective measures do not demonstrate increased fall risk, with FGA, Gait velocity and TUG measures within normal limits.  Discussed that PT may be helpful for functional training with return to work tasks, but pt declines.  Also, note that pt's blood pressure increased to 172/109 with gait tasks associated with evaluation.  No c/o symptoms.  Pt does not require additional PT services at this time.   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  EVAL only.      Patient will benefit from skilled therapeutic intervention in order to improve the following deficits and impairments:     Visit Diagnosis: Other abnormalities of gait and mobility     Problem List Patient Active Problem List   Diagnosis Date Noted  . Expressive aphasia 02/20/2017  . Mixed hyperlipidemia   . CVA (cerebral vascular accident) ([place]) 01/31/2017  . Erectile dysfunction 07/30/2016  . OSA (obstructive sleep apnea) 05/10/2015  . CKD (chronic kidney disease) stage 2, GFR 60-89 ml/min 03/10/2014  . Diabetes mellitus type II, controlled ([place]) 03/10/2014  . Chronic combined systolic and diastolic congestive heart failure ([place]) 03/03/2014  . Smoker   . Morbid obesity ([place])   . Hypertension 03/02/2014    Shepherd Finnan W. 06/06/2017, 10:04 PM  Frazier Butt.,  PT   [facility] [address] [place], [place], [address] Phone: [phone]   Fax:  [phone]  Name: Reno Clasby MRN: [id number] Date of Birth: 1966-05-27

## 2017-06-06 NOTE — Therapy (Signed)
[facility] [address] [place], [place], [address] Phone: [phone]   Fax:  [phone]  Speech Language Pathology Evaluation  Patient Details  Name: Matthew [name] MRN: [id number] Date of Birth: 20-Jan-1966 Referring Provider: Jeanmarie Hubert, MD  Encounter Date: 06/06/2017      End of Session - 06/06/17 1656    Visit Number 1   Number of Visits 9   Date for SLP Re-Evaluation 08/02/17   Authorization Type No auth - $50.00 copay - may consider reducing to 1x every other week as pt progresses   SLP Start Time 1102   SLP Stop Time  1143   SLP Time Calculation (min) 41 min   Activity Tolerance Patient tolerated treatment well      Past Medical History:  Diagnosis Date  . Acute combined systolic and diastolic CHF, NYHA class 3 ([place])    a. 02/2014 Echo: EF 25-30%.  . Cardiomyopathy ([place])    a. 02/2014 Echo: EF 25-30%, sev glob HK with inferolat HK->AK, mod conc LVH, Gr 2 DD, Mild MR, sev dil LA.  . Depression   . DM (diabetes mellitus) ([place])   . High cholesterol   . Hypertension   . Morbid obesity ([place])   . Tobacco abuse     Past Surgical History:  Procedure Laterality Date  . CATARACT EXTRACTION Right   . LEFT HEART CATHETERIZATION WITH CORONARY ANGIOGRAM N/A 03/04/2014   Procedure: LEFT HEART CATHETERIZATION WITH CORONARY ANGIOGRAM;  Surgeon: Burnell Blanks, MD;  Location: [facility] CATH LAB;  Service: Cardiovascular;  Laterality: N/A;  . TEE WITHOUT CARDIOVERSION N/A 02/04/2017   Procedure: TRANSESOPHAGEAL ECHOCARDIOGRAM (TEE);  Surgeon: Dorothy Spark, MD;  Location: [facility];  Service: Cardiovascular;  Laterality: N/A;    There were no vitals filed for this visit.      Subjective Assessment - 06/06/17 1654    Subjective "My speech is much better - I just stutter if I go too fast"   Patient is accompained by: Family member   Special Tests young daughter   Currently in Pain? No/denies             SLP Evaluation [facility] - 06/06/17 1113      SLP Visit Information   SLP Received On 06/06/17   Onset Date 01-29-17   Medical Diagnosis CVA     Subjective   Patient/Family Stated Goal "To learn to talk better and clearer to practice some drills"     Pain Assessment   Currently in Pain? No/denies     General Information   HPI Pt is a 52 y/o male who presented to ED 01-31-17 with BLE weakness, numbness in hands, and speech difficulty. MRI revealed acute infarct in the corpus callosum, additional small L ACA/watershed infarcts, and punctate acute E parietal infarct. Pt presented to ST in hospital with cognitive-communicative and speech deficits.    Mobility Status PT eval - PT did not recommend therapy     Prior Functional Status   Cognitive/Linguistic Baseline Within functional limits   Type of Home House    Lives With Spouse;Daughter;Son   Public house manager On disability     Cognition   Overall Cognitive Status Within Functional Limits for tasks assessed     Auditory Comprehension   Overall Auditory Comprehension Appears within functional limits for tasks assessed     Reading Comprehension   Reading Status Within funtional limits     Verbal Expression   Overall Verbal Expression Appears within functional limits for  tasks assessed     Oral Motor/Sensory Function   Overall Oral Motor/Sensory Function Impaired   Labial ROM Reduced right   Labial Strength Reduced   Lingual ROM Reduced right   Lingual Coordination WFL   Facial ROM Within Functional Limits   Mandible Within Functional Limits     Motor Speech   Overall Motor Speech Impaired   Respiration Within functional limits   Phonation Normal   Resonance Within functional limits   Articulation Impaired   Level of Impairment Conversation   Intelligibility Intelligible   Motor Planning Impaired   Level of Impairment Conversation   Motor Speech Errors Aware;Inconsistent   Effective Techniques Slow  rate;Over-articulate                         SLP Education - 06/06/17 1655    Education provided Yes   Education Details HEP, compensations for dysarthria   Person(s) Educated Patient   Methods Explanation;Demonstration;Verbal cues;Handout   Comprehension Verbalized understanding;Returned demonstration;Need further instruction;Verbal cues required                      SLP Long Term Goals - 06/06/17 1705      SLP LONG TERM GOAL #1   Title pt will complete HEP for dysarhtria with modified independence   Time 8   Period Weeks   Status New     SLP LONG TERM GOAL #2   Title pt will use compensatory strategies for apraxia/fluency in 5 minutes mod complex conversation 75% of the time   Time 8   Period Weeks   Status New          Plan - 06/06/17 1708    Clinical Impression Statement 51 y.o. male know to [place] from prior course of ST (3 visits) ending in March 2018. Speech is significanly improved since then, he presents today with mild verbal apraxia/dysfluency, increasing with fatigue, stress and over stimlation. During automatic speech tasks, pt I utilized slow rate. In conversation, intemittent, mild dysfluency noted. Pt verbalized frustration with this dysfluency and states he would like his speech to be "normal" for return to work. Pt is intellgible in conversatoin, without slur or distortions. Pt recalled compensations for dysarthria/verbal apraxia from prior course of ST. I recommend short course of skilled ST to maximize carryover of compensations for verbal apraxia/dysfluency and intellgibility in complex conversations and a variety of  environments.  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 Expressive aphasia 02/20/2017  . Mixed hyperlipidemia   . CVA (cerebral vascular accident) ([place]) 01/31/2017  . Erectile dysfunction 07/30/2016  . OSA (obstructive sleep apnea) 05/10/2015  . CKD (chronic kidney disease) stage 2, GFR 60-89 ml/min 03/10/2014  . Diabetes mellitus type II, controlled ([place]) 03/10/2014  . Chronic combined systolic and diastolic congestive heart failure ([place]) 03/03/2014  . Smoker   . Morbid obesity ([place])   . Hypertension 03/02/2014    Demitria Hay, Annye Rusk MS, CCC-SLP 06/06/2017, 5:09 PM  [facility] [address] [place], [place], [address] Phone: [phone]   Fax:  [phone]  Name: Matthew [name] MRN: [id number] Date of Birth: 03/12/1966

## 2017-06-06 NOTE — Patient Instructions (Signed)
  Speech exercises - do 5x each, x2-3/day SLOW BIG  SAY THE FOLLOWING-  SLOW AND BIG make every sound!  Red leather, yellow [place] [employer]ccaneers  Proper copper coffee pot  Three free throws  [place] Terrapins  [employer], Blue Bulb  [employer]  An Chief Financial Officer for [employer]  Six Thick Thistles Stick  Double Bubble Gum  Fat frogs flip freely  Tuck Tommy into bed  Freshly Fried Fat Fish  Cinnamon aluminum linoleum  Black bugs blood  Lovely lemon linament  Tying Tape Takes Time  A Shifty Salt Shaker    Call the cat "[place]"  Unique [place]  A Three Toed Tree Toad  Rubber Baby Buggy Bumpers  Flutter By [employer]    The sick sixth Sheik's sixth sheep's sick  The instinct of an extinct insect stinks  Which wrist watches are [place] wrist watches?  Imagine managing the manager at an imaginary menagerie  Not many an anemone is enamored of an enemy anemone.  If a noisy noise annoys an onion, an annoying noisy noise annoys an onion more  Knapsack strap snap.  A bragging baker baked black bread  Short soldiers should shoot sufficiently straight  She should shun the shining sun  Three thugs thrushed thoughtfully through thickness  A three-toed tree toad loved a two-toed he-toad that lived in a too-tall tree   When you are tired or having extra trouble speaking:  Get the persons attention before you speak  Use eye contact and face the person you are speaking to  Be in close proximity to the person you are speaking to  Turn down any noise in the environment such as the TV, walk away from loud appliances, air conditioners, fans, dish washers etc

## 2017-06-10 ENCOUNTER — Encounter: Payer: Self-pay | Admitting: Rehabilitation

## 2017-06-10 NOTE — Therapy (Signed)
[facility] [address] [place], [place], [address] Phone: [phone]   Fax:  [phone]  Patient Details  Name: Matthew [name] MRN: [id number] Date of Birth: [birth year] Referring Provider:  No ref. provider found  Encounter Date: 06/10/2017 (Note this DC summary is from previous episode of care.)   PHYSICAL THERAPY DISCHARGE SUMMARY  Visits from Start of Care: 1  Current functional level related to goals / functional outcomes: See initial evaluation.    Remaining deficits: Unsure as pt unable to return due to elevated Blood pressure.  Recommended he see MD (and PT sent MD note) and get BP parameters before returning to PT>     Education / Equipment: n/a  Plan: Patient agrees to discharge.  Patient goals were not met. Patient is being discharged due to a change in medical status.  ?????        Cameron Sprang, PT, MPT [facility] [address] [place], [place], [address] Phone: [phone]   Fax:  [phone] 06/10/17, 9:15 AM

## 2017-06-18 ENCOUNTER — Telehealth: Payer: Self-pay | Admitting: Family Medicine

## 2017-06-18 ENCOUNTER — Ambulatory Visit: Payer: 59 | Attending: Internal Medicine

## 2017-06-18 DIAGNOSIS — R471 Dysarthria and anarthria: Secondary | ICD-10-CM | POA: Diagnosis present

## 2017-06-18 NOTE — Telephone Encounter (Signed)
He had a stroke with ambulation difficulties, some weakness and speech impairment. He will need to obtain clearance to return to work from physical therapy or he will need to come in for a visit to see me for reassessment.

## 2017-06-18 NOTE — Telephone Encounter (Signed)
Called pt. And informed him that he needs an OV to get the letter to return back to work. Pt. Has been scheduled for 06/20/17.

## 2017-06-18 NOTE — Therapy (Signed)
[facility] [address] [place], [place], [address] Phone: [phone]   Fax:  [phone]  Speech Language Pathology Treatment  Patient Details  Name: Matthew [name] MRN: [id number] Date of Birth: 1966-01-14 Referring Provider: Jeanmarie Hubert, MD  Encounter Date: 06/18/2017      End of Session - 06/18/17 1231    Visit Number 2   Number of Visits 9   Date for SLP Re-Evaluation 08/02/17   SLP Start Time 1147   SLP Stop Time  1220   SLP Time Calculation (min) 33 min   Activity Tolerance Patient tolerated treatment well      Past Medical History:  Diagnosis Date  . Acute combined systolic and diastolic CHF, NYHA class 3 ([place])    a. 02/2014 Echo: EF 25-30%.  . Cardiomyopathy ([place])    a. 02/2014 Echo: EF 25-30%, sev glob HK with inferolat HK->AK, mod conc LVH, Gr 2 DD, Mild MR, sev dil LA.  . Depression   . DM (diabetes mellitus) ([place])   . High cholesterol   . Hypertension   . Morbid obesity ([place])   . Tobacco abuse     Past Surgical History:  Procedure Laterality Date  . CATARACT EXTRACTION Right   . LEFT HEART CATHETERIZATION WITH CORONARY ANGIOGRAM N/A 03/04/2014   Procedure: LEFT HEART CATHETERIZATION WITH CORONARY ANGIOGRAM;  Surgeon: Burnell Blanks, MD;  Location: [facility] CATH LAB;  Service: Cardiovascular;  Laterality: N/A;  . TEE WITHOUT CARDIOVERSION N/A 02/04/2017   Procedure: TRANSESOPHAGEAL ECHOCARDIOGRAM (TEE);  Surgeon: Dorothy Spark, MD;  Location: [facility];  Service: Cardiovascular;  Laterality: N/A;    There were no vitals filed for this visit.      Subjective Assessment - 06/18/17 1154    Subjective "I want to see about having speech be-be done pretty soon too."   Patient is accompained by: Family member  daughter               ADULT SLP TREATMENT - 06/18/17 1200      General Information   Behavior/Cognition Alert;Cooperative;Pleasant mood     Treatment Provided    Treatment provided Cognitive-Linquistic     Cognitive-Linquistic Treatment   Treatment focused on Dysarthria   Skilled Treatment Pt entered the room talking about his need to return to work. Since he was cleared from PT, work stated they need a MD note and he can return to work. Because of this, pt explained that he would also like to cancel remaining speech therapy appointments. He wishes to go to MD office today and requests leaving early to arrive there by 1230. SLP educated pt on what he needs to do from this point to continue to work on his speech. Pt was 85% successful with HEP. In sentence responses, pt req'd rare min A fading to SBA for overarticulation and slower speech rate.  In conversation by the end of session, pt's speech was much more smooth with less hesitation noted.     Assessment / Recommendations / Plan   Plan Discharge SLP treatment due to (comment)  pt request     Progression Toward Goals   Progression toward goals --  d/c - see goals          SLP Education - 06/18/17 1231    Education provided Yes   Education Details HEP, compensations for speech intelligiblity   Person(s) Educated Patient;Child(ren)   Methods Explanation;Demonstration;Verbal cues   Comprehension Verbalized understanding;Returned demonstration;Verbal cues required  SLP Long Term Goals - 06/18/17 1236      SLP LONG TERM GOAL #1   Title pt will complete HEP for dysarhtria with modified independence   Status Achieved     SLP LONG TERM GOAL #2   Title pt will use compensatory strategies for apraxia/fluency in 5 minutes mod complex conversation 75% of the time   Status Not Met          Plan - 06/18/17 1232    Clinical Impression Statement Pt desires to cancel remaining ST due to going back to work. He did very well in his session today, transferring speech strategies more in the last 10 minutes of conversation after practice for the first 20 on slowed rate and overarticulation.  Pt desires also to leave early today to go to his PCP's office before their lunch at 1230, for a MD note to return to work.   Treatment/Interventions Oral motor exercises;Compensatory strategies;Patient/family education;Functional tasks;Cueing hierarchy;SLP instruction and feedback;Internal/external aids   Potential to Achieve Goals Good      Patient will benefit from skilled therapeutic intervention in order to improve the following deficits and impairments:   Dysarthria and anarthria   SPEECH THERAPY DISCHARGE SUMMARY  Visits from Start of Care: 2  Current functional level related to goals / functional outcomes: Pt desires d/c today from [facility] in order to return to work. Please see goals above for success achieved.    Remaining deficits: Mild dysarthria/apraxia, better with focus on speech intelligibility strategies.   Education / Equipment: Speech intelligibility strategies.  Plan: Patient agrees to discharge.  Patient goals were partially met. Patient is being discharged due to the patient's request.  ?????       Problem List Patient Active Problem List   Diagnosis Date Noted  . Expressive aphasia 02/20/2017  . Mixed hyperlipidemia   . CVA (cerebral vascular accident) ([place]) 01/31/2017  . Erectile dysfunction 07/30/2016  . OSA (obstructive sleep apnea) 05/10/2015  . CKD (chronic kidney disease) stage 2, GFR 60-89 ml/min 03/10/2014  . Diabetes mellitus type II, controlled ([place]) 03/10/2014  . Chronic combined systolic and diastolic congestive heart failure ([place]) 03/03/2014  . Smoker   . Morbid obesity ([place])   . Hypertension 03/02/2014    [facility] 06/18/2017, 12:37 PM  [facility] [address] [place], [place], [address] Phone: [phone]   Fax:  [phone]   Name: Aaryan Essman MRN: [id number] Date of Birth: [birth year]

## 2017-06-18 NOTE — Patient Instructions (Signed)
Continue your home exercises daily, until you feel comfortable with your ability to speak slower and more precise in conversations. You can also search for "tongue twisters" on Google and say 15-20 per day with over-enunciation and slower speech rate.

## 2017-06-18 NOTE — Telephone Encounter (Signed)
Pt. Came to facility requesting a letter from his PCP stating that he can return to work. Pt. States that he has finished with his speech therapy. Please f/u with pt.

## 2017-06-18 NOTE — Telephone Encounter (Signed)
Patient should obtain release from physical therpay/speech therapy or will need to schedule an OV with amao

## 2017-06-18 NOTE — Telephone Encounter (Signed)
Please advise on constructing a work release note.

## 2017-06-20 ENCOUNTER — Encounter: Payer: Self-pay | Admitting: Family Medicine

## 2017-06-20 ENCOUNTER — Ambulatory Visit: Payer: 59 | Attending: Family Medicine | Admitting: Family Medicine

## 2017-06-20 VITALS — BP 136/89 | HR 76 | Temp 97.9°F | Resp 18 | Ht 69.0 in | Wt 254.0 lb

## 2017-06-20 DIAGNOSIS — Z79899 Other long term (current) drug therapy: Secondary | ICD-10-CM | POA: Insufficient documentation

## 2017-06-20 DIAGNOSIS — Z7984 Long term (current) use of oral hypoglycemic drugs: Secondary | ICD-10-CM | POA: Diagnosis not present

## 2017-06-20 DIAGNOSIS — E1121 Type 2 diabetes mellitus with diabetic nephropathy: Secondary | ICD-10-CM | POA: Insufficient documentation

## 2017-06-20 DIAGNOSIS — E782 Mixed hyperlipidemia: Secondary | ICD-10-CM | POA: Diagnosis not present

## 2017-06-20 DIAGNOSIS — Z87891 Personal history of nicotine dependence: Secondary | ICD-10-CM | POA: Diagnosis not present

## 2017-06-20 DIAGNOSIS — I110 Hypertensive heart disease with heart failure: Secondary | ICD-10-CM | POA: Diagnosis not present

## 2017-06-20 DIAGNOSIS — I6300 Cerebral infarction due to thrombosis of unspecified precerebral artery: Secondary | ICD-10-CM | POA: Diagnosis present

## 2017-06-20 DIAGNOSIS — I429 Cardiomyopathy, unspecified: Secondary | ICD-10-CM | POA: Insufficient documentation

## 2017-06-20 DIAGNOSIS — I5042 Chronic combined systolic (congestive) and diastolic (congestive) heart failure: Secondary | ICD-10-CM | POA: Diagnosis not present

## 2017-06-20 DIAGNOSIS — Z7982 Long term (current) use of aspirin: Secondary | ICD-10-CM | POA: Insufficient documentation

## 2017-06-20 DIAGNOSIS — I10 Essential (primary) hypertension: Secondary | ICD-10-CM

## 2017-06-20 DIAGNOSIS — F329 Major depressive disorder, single episode, unspecified: Secondary | ICD-10-CM | POA: Insufficient documentation

## 2017-06-20 DIAGNOSIS — R4701 Aphasia: Secondary | ICD-10-CM | POA: Insufficient documentation

## 2017-06-20 DIAGNOSIS — G8194 Hemiplegia, unspecified affecting left nondominant side: Secondary | ICD-10-CM | POA: Insufficient documentation

## 2017-06-20 NOTE — Progress Notes (Signed)
Subjective:    Patient ID: Matthew [name], male    DOB: 17-Jun-1966, 51 y.o.   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  In 01/2017 he had presented with bilateral lower extremity weakness, dysarthria, tingling in his hand. CT head revealed abnormal low density in the left corpus callosum and pericallosal deep white matter that could represent recent left ACA infarction.  MRI revealed Acute infarction involving a large portion of the corpus callosum, left greater than right. Additional small left ACA/watershed infarcts. Punctate acute right parietal infarct. 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 He did not receive TPA due to being outside the window. TEE was negative for embolic source; 2-D echo revealed EF of 45-50%, carotid Dopplers were negative.  Seen by neurology  Two and a half months ago.  He has been ambulating without difficulty and his speech has improved significantly. He has also regained strength in his extremities. He is accompanied by his wife today and has no acute concerns.  Past Medical History:  Diagnosis Date  . Acute combined systolic and diastolic CHF, NYHA class 3 ([place])    a. 02/2014 Echo: EF 25-30%.  . Cardiomyopathy ([place])    a. 02/2014 Echo: EF 25-30%, sev glob HK with inferolat HK->AK, mod conc LVH, Gr 2 DD, Mild MR, sev dil LA.  . Depression   . DM (diabetes mellitus) ([place])   . High cholesterol   . Hypertension   . Morbid obesity ([place])   . Tobacco abuse     Past Surgical History:  Procedure Laterality Date  . CATARACT EXTRACTION Right   . LEFT HEART CATHETERIZATION WITH CORONARY ANGIOGRAM N/A  03/04/2014   Procedure: LEFT HEART CATHETERIZATION WITH CORONARY ANGIOGRAM;  Surgeon: Burnell Blanks, MD;  [facility] C[facility] LAB;  Service: Cardiovascular;  Laterality: N/A;  . TEE WITHOUT CARDIOVERSION N/A 02/04/2017   Procedure: TRANSESOPHAGEAL ECHOCARDIOGRAM (TEE);  Surgeon: Dorothy Spark, MD;  Location: [facility] ENDOSCOPY;  Service: Cardiovascular;  Laterality: N/A;    No Known Allergies  Current Outpatient Prescriptions on File Prior to Visit  Medication Sig Dispense Refill  . acetaminophen (TYLENOL) 500 MG tablet Take 500 mg by mouth every 6 (six) hours as needed for mild pain.    Marland Kitchen aspirin 325 MG tablet Take 1 tablet (325 mg total) by mouth daily. 30 tablet 5  . carvedilol (COREG) 6.25 MG tablet Take 1 tablet (6.25 mg total) by mouth 2 (two) times daily with a meal. 60 tablet 5  . ezetimibe (ZETIA) 10 MG tablet Take 1 tablet (10 mg total) by mouth daily. 30 tablet 5  . furosemide (LASIX) 40 MG tablet Take 1 tablet (40 mg total) by mouth daily. 30 tablet 5  . glipiZIDE (GLUCOTROL) 5 MG tablet Take 1 tablet (5 mg total) by mouth 2 (two) times daily before a meal. 60 tablet 3  . glucose blood (ONE TOUCH ULTRA TEST) test strip Use as instructed 30 each 12  . hydrALAZINE (APRESOLINE) 25 MG tablet Take 1 tablet (25 mg total) by mouth 3 (three) times daily. 90 tablet 5  . simvastatin (ZOCOR) 20 MG tablet Take 1 tablet (20 mg total) by mouth  at bedtime. 30 tablet 5  . [DISCONTINUED] lovastatin (MEVACOR) 20 MG tablet Take 2 tablets (40 mg total) by mouth at bedtime. 60 tablet 1   No current facility-administered medications on file prior to visit.       Review of Systems Constitutional: Negative for activity change and appetite change.  HENT: Negative for sinus pressure and sore throat.   Eyes: Negative for visual disturbance.  Respiratory: Negative for cough, chest tightness and shortness of breath.   Cardiovascular: Negative for chest pain and leg swelling.  Gastrointestinal:  Negative for abdominal distention, abdominal pain, constipation and diarrhea.  Endocrine: Negative.   Genitourinary: Negative for dysuria.  Musculoskeletal: Negative for joint swelling and myalgias.  Skin: Negative for rash.  Allergic/Immunologic: Negative.   Neurological: Positive for  weakness. Negative for light-headedness and numbness.  Psychiatric/Behavioral: Negative for dysphoric mood and suicidal ideas.        Objective: Vitals:   06/20/17 0927  BP: 136/89  Pulse: 76  Resp: 18  Temp: 97.9 F (36.6 C)  TempSrc: Oral  SpO2: 98%  Weight: 254 lb (115.2 kg)  Height: 5\' 9"  (1.753 m)      Physical Exam Constitutional: He is oriented to person, place, and time. He appears well-developed and well-nourished.  Cardiovascular: Normal rate, normal heart sounds and intact distal pulses.   No murmur heard. Pulmonary/Chest: Effort normal and breath sounds normal. He has no wheezes. He has no rales. He exhibits no tenderness.  Abdominal: Soft. Bowel sounds are normal. He exhibits no distension and no mass. There is no tenderness.  Musculoskeletal: Normal range of motion.  Neurological: He is alert and oriented to person, place, and time. A cranial nerve deficit (left facial nerve palsy) is present. Normal gait  Motor strength: Left upper extremity - 4+/5; left lower extremity -5+/5 Right upper extremity- 5/5; right lower extremity -5/5  Skin: Skin is warm and dry.  Psychiatric: He has a normal mood and affect       Assessment & Plan:  1. Controlled type 2 diabetes mellitus with diabetic nephropathy, without long-term current use of insulin (HCC) Controlled with A1c of 6.6 Continue glipizide Diabetic diet - Glucose (CBG)  2. Morbid obesity (HCC) Reduce portion sizes, weight loss, increase physical activity  3. Mixed hyperlipidemia Currently on statin We'll consider increasing dose of simvastatin at next visit due to his increased cardiovascular risk Low-cholesterol diet -  ezetimibe (ZETIA) 10 MG tablet; Take 1 tablet (10 mg total) by mouth daily.  Dispense: 30 tablet; Refill: 5  4. Chronic combined systolic and diastolic congestive heart failure (HCC) EF of 40-45% from 2-D echo of 01/2017 Euvolemic  5. Cerebrovascular accident (CVA) due to thrombosis of precerebral artery ([place]) With mild residual left hemiparesis  Risk factor modification Completed PT/OT/ST Provided note to return to work - aspirin 325 MG tablet; Take 1 tablet (325 mg total) by mouth daily.  Dispense: 30 tablet; Refill: 5   6. Expressive aphasia Improved significantly and he is close to baseline  7. Essential hypertension Controlled Continue antihypertensives Low-sodium diet - hydrALAZINE (APRESOLINE) 25 MG tablet; Take 1 tablet (25 mg total) by mouth 3 (three) times daily.  Dispense: 90 tablet; Refill: 5

## 2017-06-20 NOTE — Progress Notes (Signed)
Patient is here for wants to get release to go back to work

## 2017-06-26 ENCOUNTER — Encounter: Payer: 59 | Admitting: Speech Pathology

## 2017-07-01 MED FILL — SIMVASTATIN 20 MG TABLET: 20 | 30 days supply | Qty: 30 | Fill #2

## 2017-07-01 MED FILL — glipiZIDE 5 MG TABS: 5 | 30 days supply | Qty: 60 | Fill #2

## 2017-07-01 MED FILL — ?CARVEDILOL 6.25 MG TABLET: 6.25 | 30 days supply | Qty: 60 | Fill #2

## 2017-07-01 MED FILL — EZETIMIBE 10 MG TABLET: 10 | 30 days supply | Qty: 30 | Fill #2

## 2017-07-01 MED FILL — FUROSEMIDE 40 MG TABLET: 40 | 30 days supply | Qty: 30 | Fill #2

## 2017-07-01 MED FILL — hydrALAZINE HCL 25 MG TABS: 25 | 30 days supply | Qty: 90 | Fill #2

## 2017-07-02 ENCOUNTER — Encounter: Payer: 59 | Admitting: Speech Pathology

## 2017-07-09 ENCOUNTER — Encounter: Payer: 59 | Admitting: Speech Pathology

## 2017-07-16 ENCOUNTER — Encounter: Payer: 59 | Admitting: Speech Pathology

## 2017-07-30 ENCOUNTER — Encounter: Payer: 59 | Admitting: Speech Pathology

## 2017-08-06 ENCOUNTER — Encounter: Payer: 59 | Admitting: Speech Pathology

## 2017-08-06 MED FILL — FUROSEMIDE 40 MG TABLET: 40 | 30 days supply | Qty: 30 | Fill #3

## 2017-08-06 MED FILL — ?CARVEDILOL 6.25 MG TABLET: 6.25 | 30 days supply | Qty: 60 | Fill #3

## 2017-08-06 MED FILL — glipiZIDE 5 MG TABS: 5 | 30 days supply | Qty: 60 | Fill #3

## 2017-08-06 MED FILL — hydrALAZINE HCL 25 MG TABS: 25 | 30 days supply | Qty: 90 | Fill #3

## 2017-08-06 MED FILL — SIMVASTATIN 20 MG TABLET: 20 | 30 days supply | Qty: 30 | Fill #3

## 2017-08-06 MED FILL — EZETIMIBE 10 MG TAB: 10 | 30 days supply | Qty: 30 | Fill #3

## 2017-08-08 ENCOUNTER — Ambulatory Visit: Payer: 59 | Admitting: Family Medicine

## 2017-08-14 ENCOUNTER — Ambulatory Visit: Payer: 59 | Admitting: Family Medicine

## 2017-08-29 MED FILL — ?CARVEDILOL 6.25 MG TABLET: 6.25 | 30 days supply | Qty: 60 | Fill #4

## 2017-08-29 MED FILL — EZETIMIBE 10 MG TAB: 10 | 30 days supply | Qty: 30 | Fill #4

## 2017-08-29 MED FILL — FUROSEMIDE 40 MG TABLET: 40 | 30 days supply | Qty: 30 | Fill #4

## 2017-08-29 MED FILL — hydrALAZINE HCL 25 MG TABS: 25 | 30 days supply | Qty: 90 | Fill #4

## 2017-08-29 MED FILL — glipiZIDE 5 MG TABS: 5 | 30 days supply | Qty: 60 | Fill #0

## 2017-08-29 MED FILL — SIMVASTATIN 20 MG TABLET: 20 | 30 days supply | Qty: 30 | Fill #4

## 2017-09-23 ENCOUNTER — Ambulatory Visit: Payer: 59 | Admitting: Family Medicine

## 2017-09-25 ENCOUNTER — Ambulatory Visit: Payer: 59 | Admitting: Family Medicine

## 2017-10-04 ENCOUNTER — Ambulatory Visit: Payer: 59 | Admitting: Family Medicine

## 2017-10-08 MED FILL — EZETIMIBE 10 MG TABS: 10 | 30 days supply | Qty: 30 | Fill #5

## 2017-10-09 NOTE — Progress Notes (Deleted)
[facility]  PATIENT: Matthew [name] DOB: 10-22-1966   REASON FOR VISIT: Follow-up for stroke February 2018 HISTORY FROM:    HISTORY OF PRESENT ILLNESS:HISTORY 04/10/17 Matthew [name] is a 51 y.o. male here as a referral from Dr. Jarold Song for Stroke. Patient ran out of his blood pressure medications (maybe his others, he does not know) and has not taken them in a few weeks. I will refill and he needs to see pcp for further management.  Patient presented to [facility] hospital mid February 2018 with decreased sensation of the bilateral legs as well as weakness which progressed to include tingling in both hands and then dysarthria. Patient improved in the hospital. MRI showed bilateral ACA infarcts, embolic pattern. Patient returns today for follow-up. Source was likely embolic (unknown source) however patient does have many risk factors including smoking, type 2 diabetes, hypertension and elevated cholesterol and triglycerides, obesity, OSA, CHF. He has been undergoing physical therapy, occupational therapy and speech therapy. 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 He still complains of tingling in the hands, speech difficulty and wobbly gait. His blood pressure has been elevated recently and he has been working with his primary care.  He feels he is better, still difficulty with speaking just a little, still feels weak in the lowers, still some tingling in the fingers but improved.     REVIEW OF SYSTEMS: Full 14 system review of systems performed and notable only for those listed, all others are neg:  Constitutional: neg  Cardiovascular: neg Ear/Nose/Throat: neg  Skin: neg Eyes: neg Respiratory: neg Gastroitestinal: neg  Hematology/Lymphatic: neg  Endocrine: neg Musculoskeletal:neg Allergy/Immunology: neg Neurological: neg Psychiatric: neg Sleep : neg   ALLERGIES: No Known Allergies  HOME MEDICATIONS: Outpatient Medications Prior to Visit  Medication Sig  Dispense Refill  . acetaminophen (TYLENOL) 500 MG tablet Take 500 mg by mouth every 6 (six) hours as needed for mild pain.    Marland Kitchen aspirin 325 MG tablet Take 1 tablet (325 mg total) by mouth daily. 30 tablet 5  . carvedilol (COREG) 6.25 MG tablet Take 1 tablet (6.25 mg total) by mouth 2 (two) times daily with a meal. 60 tablet 5  . ezetimibe (ZETIA) 10 MG tablet Take 1 tablet (10 mg total) by mouth daily. 30 tablet 5  . furosemide (LASIX) 40 MG tablet Take 1 tablet (40 mg total) by mouth daily. 30 tablet 5  . glipiZIDE (GLUCOTROL) 5 MG tablet Take 1 tablet (5 mg total) by mouth 2 (two) times daily before a meal. 60 tablet 3  . glucose blood (ONE TOUCH ULTRA TEST) test strip Use as instructed 30 each 12  . hydrALAZINE (APRESOLINE) 25 MG tablet Take 1 tablet (25 mg total) by mouth 3 (three) times daily. 90 tablet 5  . simvastatin (ZOCOR) 20 MG tablet Take 1 tablet (20 mg total) by mouth at bedtime. 30 tablet 5   No facility-administered medications prior to visit.     PAST MEDICAL HISTORY: Past Medical History:  Diagnosis Date  . Acute combined systolic and diastolic CHF, NYHA class 3 ([place])    a. 02/2014 Echo: EF 25-30%.  . Cardiomyopathy ([place])    a. 02/2014 Echo: EF 25-30%, sev glob HK with inferolat HK->AK, mod conc LVH, Gr 2 DD, Mild MR, sev dil LA.  . Depression   . DM (diabetes mellitus) ([place])   . High cholesterol   . Hypertension   . Morbid obesity ([place])   . Tobacco abuse  PAST SURGICAL HISTORY: Past Surgical History:  Procedure Laterality Date  . CATARACT EXTRACTION Right   . LEFT HEART CATHETERIZATION WITH CORONARY ANGIOGRAM N/A 03/04/2014   Procedure: LEFT HEART CATHETERIZATION WITH CORONARY ANGIOGRAM;  Surgeon: Burnell Blanks, MD;  Location: [facility] CATH LAB;  Service: Cardiovascular;  Laterality: N/A;  . TEE WITHOUT CARDIOVERSION N/A 02/04/2017   Procedure: TRANSESOPHAGEAL ECHOCARDIOGRAM (TEE);  Surgeon: Dorothy Spark, MD;  Location: [facility] ENDOSCOPY;  Service:  Cardiovascular;  Laterality: N/A;    FAMILY HISTORY: Family History  Problem Relation Age of Onset  . Heart attack Father        died @ 44  . Cervical cancer Mother        died @ 14    SOCIAL HISTORY: Social History   Social History  . Marital status: Married    Spouse name: N/A  . Number of children: 2  . Years of education: 14   Occupational History  . [employer]    Social History Main Topics  . Smoking status: Former Smoker    Years: 20.00    Types: Cigarettes    Quit date: 03/02/2014  . Smokeless tobacco: Never Used  . Alcohol use No  . Drug use: No  . Sexual activity: Yes   Other Topics Concern  . Not on file   Social History Narrative   Lives with wife and 2 young children in [place].  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   Left-handed   Caffeine: occasional tea     PHYSICAL EXAM  There were no vitals filed for this visit. There is no height or weight on file to calculate BMI.  Generalized: Well developed, in no acute distress  Head: normocephalic and atraumatic,. Oropharynx benign  Neck: Supple, no carotid bruits  Cardiac: Regular rate rhythm, no murmur  Musculoskeletal: No deformity   Neurological examination   Mentation: Alert oriented to time, place, history taking. Attention span and concentration appropriate. Recent and remote memory intact.  Follows all commands speech and language fluent.   Cranial nerve II-XII: Fundoscopic exam reveals sharp disc margins.Pupils were equal round reactive to light extraocular movements were full, visual field were full on confrontational test. Facial sensation and strength were normal. hearing was intact to finger rubbing bilaterally. Uvula tongue midline. head turning and shoulder shrug were normal and symmetric.Tongue protrusion into cheek strength was normal. Motor: normal bulk and tone, full strength in the BUE, BLE, fine finger movements normal, no pronator drift. No focal  weakness Sensory: normal and symmetric to light touch, pinprick, and  Vibration, proprioception  Coordination: finger-nose-finger, heel-to-shin bilaterally, no dysmetria Reflexes: Brachioradialis 2/2, biceps 2/2, triceps 2/2, patellar 2/2, Achilles 2/2, plantar responses were flexor bilaterally. Gait and Station: Rising up from seated position without assistance, normal stance,  moderate stride, good arm swing, smooth turning, able to perform tiptoe, and heel walking without difficulty. Tandem gait is steady  DIAGNOSTIC DATA (LABS, IMAGING, TESTING) - I reviewed patient records, labs, notes, testing and imaging myself where available.  Lab Results  Component Value Date   WBC 5.0 01/31/2017   HGB 16.3 01/31/2017   HCT 48.0 01/31/2017   MCV 87.2 01/31/2017   PLT 249 01/31/2017      Component Value Date/Time   NA 139 04/22/2017 1422   K 3.8 04/22/2017 1422   CL 96 04/22/2017 1422   CO2 24 04/22/2017 1422   GLUCOSE 177 (H) 04/22/2017 1422   GLUCOSE 146 (H) 01/31/2017 0935  BUN 12 04/22/2017 1422   CREATININE 1.34 (H) 04/22/2017 1422   CREATININE 1.26 01/01/2017 0838   CALCIUM 9.4 04/22/2017 1422   PROT 7.0 04/22/2017 1422   ALBUMIN 4.3 04/22/2017 1422   AST 19 04/22/2017 1422   ALT 20 04/22/2017 1422   ALKPHOS 68 04/22/2017 1422   BILITOT 0.3 04/22/2017 1422   GFRNONAA 61 04/22/2017 1422   GFRNONAA 67 01/01/2017 0838   GFRAA 71 04/22/2017 1422   GFRAA 77 01/01/2017 0838   Lab Results  Component Value Date   CHOL 147 02/01/2017   HDL 28 (L) 02/01/2017   LDLCALC UNABLE TO CALCULATE IF TRIGLYCERIDE OVER 400 mg/dL 02/01/2017   TRIG 415 (H) 02/01/2017   CHOLHDL 5.3 02/01/2017   Lab Results  Component Value Date   HGBA1C 6.6 04/22/2017   No results found for: [id number] Lab Results  Component Value Date   TSH 1.478 03/02/2014      ASSESSMENT AND PLAN  51 y.o. year old male  has a past medical history of Acute combined systolic and diastolic CHF, NYHA class 3  ([place]); Cardiomyopathy ([place]); Depression; DM (diabetes mellitus) ([place]); High cholesterol; Hypertension; Morbid obesity ([place]); and Tobacco abuse. here with ***  ASSESSMENT/PLAN: Mr.Sumeet Jacksonis a 51 y.o.malewith history of combined systolic and diastolic CHF with EF 92-42%, HTN, obesity, DM, HLDpresenting with Bilateral leg weakness and tingling in hands and legs along with R facial decreased movement. Hedidnot receive IV t-PA due to being out of the window.    - He is improving in balance and strength. Continue PT/OT/speech. He quit smoking. He is on short-term disability. He is taking his ASA daily, ran out of other meds. Goes to PT and exercising more, endorses fatigue.   Dennie Bible, [facility], [facility], APRN  [facility] [address] [place], [place] [address] [phone]

## 2017-10-10 ENCOUNTER — Ambulatory Visit: Payer: 59 | Admitting: Nurse Practitioner

## 2017-11-05 ENCOUNTER — Other Ambulatory Visit: Payer: Self-pay | Admitting: Neurology

## 2017-11-05 ENCOUNTER — Telehealth: Payer: Self-pay | Admitting: Neurology

## 2017-11-05 DIAGNOSIS — E782 Mixed hyperlipidemia: Secondary | ICD-10-CM

## 2017-11-05 DIAGNOSIS — I10 Essential (primary) hypertension: Secondary | ICD-10-CM

## 2017-11-05 DIAGNOSIS — E1121 Type 2 diabetes mellitus with diabetic nephropathy: Secondary | ICD-10-CM

## 2017-11-05 MED FILL — ?FUROSEMIDE 40 MG TABLET: 40 | 30 days supply | Qty: 30 | Fill #5

## 2017-11-05 MED FILL — ?CARVEDILOL 6.25 MG TABLET: 6.25 | 30 days supply | Qty: 60 | Fill #5

## 2017-11-05 MED FILL — hydrALAZINE HCL 25 MG TABS: 25 | 30 days supply | Qty: 90 | Fill #5

## 2017-11-05 MED FILL — ?GLIPIZIDE 5MG TABLET: 5 | 30 days supply | Qty: 60 | Fill #1

## 2017-11-05 NOTE — Telephone Encounter (Signed)
Pt request for ezetimibe (ZETIA) 10 MG tablet , simvastatin (ZOCOR) 20 MG tablet , hydrALAZINE (APRESOLINE) 25 MG tablet , carvedilol (COREG) 6.25 MG tablet , furosemide (LASIX) 40 MG tablet ,glipiZIDE (GLUCOTROL) 5 MG tablet sent to [place]. Pt will be out of some of the medications tomorrow. Please send asap.

## 2017-11-05 NOTE — Telephone Encounter (Signed)
Per Dr. Jaynee Eagles, pt needs to contact PCP for these medication refills as she refilled them in April out of courtesy. I called the patient and advised him to contact Dr. Jarold Song. He verbalized understanding and will call. Told patient to call us if he cannot get in touch with his office. I also sent Dr. Jarold Song a high priority message informing him of the above and told him that patient should be contacting him.   -if pt calls back due to not being able to get refills from Dr. Jarold Song, we could give him 1 month of the meds he's going to run out of tomorrow to hold him until more refills ordered by PCP.

## 2017-11-06 ENCOUNTER — Other Ambulatory Visit: Payer: Self-pay | Admitting: Family Medicine

## 2017-11-06 DIAGNOSIS — E782 Mixed hyperlipidemia: Secondary | ICD-10-CM

## 2017-11-06 DIAGNOSIS — I10 Essential (primary) hypertension: Secondary | ICD-10-CM

## 2017-11-06 DIAGNOSIS — E119 Type 2 diabetes mellitus without complications: Secondary | ICD-10-CM

## 2017-11-06 DIAGNOSIS — E1121 Type 2 diabetes mellitus with diabetic nephropathy: Secondary | ICD-10-CM

## 2017-11-06 MED ORDER — SIMVASTATIN 20 MG PO TABS: 20.0000 mg | ORAL_TABLET | Freq: Every day | ORAL | 1 refills | Status: DC

## 2017-11-06 MED ORDER — HYDRALAZINE HCL 25 MG PO TABS: 25.0000 mg | ORAL_TABLET | Freq: Three times a day (TID) | ORAL | 1 refills | Status: DC

## 2017-11-06 MED ORDER — FUROSEMIDE 40 MG PO TABS: 40.0000 mg | ORAL_TABLET | Freq: Every day | ORAL | 1 refills | Status: DC

## 2017-11-06 MED ORDER — CARVEDILOL 6.25 MG PO TABS: 6.2500 mg | ORAL_TABLET | Freq: Two times a day (BID) | ORAL | 1 refills | Status: DC

## 2017-11-06 MED ORDER — GLIPIZIDE 5 MG PO TABS: 5.0000 mg | ORAL_TABLET | Freq: Two times a day (BID) | ORAL | 1 refills | Status: DC

## 2017-11-06 MED ORDER — EZETIMIBE 10 MG PO TABS: 10.0000 mg | ORAL_TABLET | Freq: Every day | ORAL | 1 refills | Status: DC

## 2017-11-06 NOTE — Telephone Encounter (Addendum)
Called patient to make sure he was aware that Dr. Jarold Song had sent in refills and that Dr. Jarold Song stated he needs to contact the office to make an appt. He verbalized understanding and stated he would call Dr. Johna Sheriff office.    ----- Message from Arnoldo Morale, MD sent at 11/06/2017  8:36 AM EST ----- Regarding: RE: medication refill Refills have been sent to his pharmacy on file. He needs to contact my office for an appointment.  Thanks, Dr Jarold Song.  ----- Message ----- From: Gildardo Griffes, RN Sent: 11/05/2017  12:22 PM To: Arnoldo Morale, MD Subject: medication refill                              I meant to put the previous message as high priority.   Thanks,  [facility]

## 2017-12-03 ENCOUNTER — Ambulatory Visit: Payer: Self-pay | Attending: Family Medicine | Admitting: Family Medicine

## 2017-12-03 ENCOUNTER — Encounter: Payer: Self-pay | Admitting: Family Medicine

## 2017-12-03 VITALS — BP 160/104 | HR 75 | Temp 98.3°F | Ht 69.0 in | Wt 257.6 lb

## 2017-12-03 DIAGNOSIS — Z9889 Other specified postprocedural states: Secondary | ICD-10-CM | POA: Insufficient documentation

## 2017-12-03 DIAGNOSIS — Z7982 Long term (current) use of aspirin: Secondary | ICD-10-CM | POA: Insufficient documentation

## 2017-12-03 DIAGNOSIS — Z8673 Personal history of transient ischemic attack (TIA), and cerebral infarction without residual deficits: Secondary | ICD-10-CM

## 2017-12-03 DIAGNOSIS — I5042 Chronic combined systolic (congestive) and diastolic (congestive) heart failure: Secondary | ICD-10-CM

## 2017-12-03 DIAGNOSIS — Z7984 Long term (current) use of oral hypoglycemic drugs: Secondary | ICD-10-CM | POA: Insufficient documentation

## 2017-12-03 DIAGNOSIS — E119 Type 2 diabetes mellitus without complications: Secondary | ICD-10-CM

## 2017-12-03 DIAGNOSIS — E782 Mixed hyperlipidemia: Secondary | ICD-10-CM

## 2017-12-03 DIAGNOSIS — I110 Hypertensive heart disease with heart failure: Secondary | ICD-10-CM | POA: Insufficient documentation

## 2017-12-03 DIAGNOSIS — Z79899 Other long term (current) drug therapy: Secondary | ICD-10-CM | POA: Insufficient documentation

## 2017-12-03 DIAGNOSIS — I10 Essential (primary) hypertension: Secondary | ICD-10-CM

## 2017-12-03 LAB — POCT GLYCOSYLATED HEMOGLOBIN (HGB A1C): Hemoglobin A1C: 6.4

## 2017-12-03 LAB — GLUCOSE, POCT (MANUAL RESULT ENTRY): POC Glucose: 100 mg/dl — AB (ref 70–99)

## 2017-12-03 MED ORDER — HYDRALAZINE HCL 25 MG PO TABS
25.0000 mg | ORAL_TABLET | Freq: Three times a day (TID) | ORAL | 6 refills | Status: DC
Start: 2017-12-03 — End: 2018-03-05

## 2017-12-03 MED ORDER — FUROSEMIDE 40 MG PO TABS: 40.0000 mg | ORAL_TABLET | Freq: Every day | ORAL | 6 refills | Status: DC

## 2017-12-03 MED ORDER — ATORVASTATIN CALCIUM 80 MG PO TABS: 80.0000 mg | ORAL_TABLET | Freq: Every day | ORAL | 6 refills | Status: DC

## 2017-12-03 MED ORDER — GLIPIZIDE 5 MG PO TABS: 5.0000 mg | ORAL_TABLET | Freq: Two times a day (BID) | ORAL | 6 refills | Status: DC

## 2017-12-03 MED ORDER — CARVEDILOL 12.5 MG PO TABS: 12.5000 mg | ORAL_TABLET | Freq: Two times a day (BID) | ORAL | 6 refills | Status: DC

## 2017-12-03 MED FILL — ?CARVEDILOL 12.5 MG TABLET: 12.5 | 30 days supply | Qty: 60 | Fill #0

## 2017-12-03 MED FILL — hydrALAZINE HCL 25 MG TABS: 25 | 30 days supply | Qty: 90 | Fill #0

## 2017-12-03 MED FILL — ?FUROSEMIDE 40 MG TABLET: 40 | 30 days supply | Qty: 30 | Fill #0

## 2017-12-03 MED FILL — ?GLIPIZIDE 5MG TABLET: 5 | 30 days supply | Qty: 60 | Fill #0

## 2017-12-03 MED FILL — ATORVASTATIN 80 MG TABLET: 80 | 30 days supply | Qty: 30 | Fill #0

## 2017-12-03 NOTE — Patient Instructions (Signed)

## 2017-12-04 LAB — CMP14+EGFR
ALT: 29 IU/L (ref 0–44)
AST: 20 IU/L (ref 0–40)
Albumin/Globulin Ratio: 1.7 (ref 1.2–2.2)
Albumin: 4.5 g/dL (ref 3.5–5.5)
Alkaline Phosphatase: 79 IU/L (ref 39–117)
BUN/Creatinine Ratio: 10 (ref 9–20)
BUN: 11 mg/dL (ref 6–24)
Bilirubin Total: 0.2 mg/dL (ref 0.0–1.2)
CO2: 21 mmol/L (ref 20–29)
Calcium: 9.2 mg/dL (ref 8.7–10.2)
Chloride: 105 mmol/L (ref 96–106)
Creatinine, Ser: 1.14 mg/dL (ref 0.76–1.27)
GFR calc Af Amer: 86 mL/min/{1.73_m2} (ref >59)
GFR calc non Af Amer: 75 mL/min/{1.73_m2} (ref >59)
Globulin, Total: 2.6 g/dL (ref 1.5–4.5)
Glucose: 99 mg/dL (ref 65–99)
Potassium: 4.1 mmol/L (ref 3.5–5.2)
Sodium: 140 mmol/L (ref 134–144)
Total Protein: 7.1 g/dL (ref 6.0–8.5)

## 2017-12-04 NOTE — Progress Notes (Signed)
Subjective:  Patient ID: Matthew [name], male    DOB: 21-May-1966  Age: 51 y.o. 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  2D echo 01/22/2017: Study Conclusions  - Left ventricle: The cavity size was mildly dilated. Wall   thickness was normal. Systolic function was mildly reduced. The   estimated ejection fraction was in the range of 45% to 50%.   Features are consistent with a pseudonormal left ventricular   filling pattern, with concomitant abnormal relaxation and   increased filling pressure (grade 2 diastolic dysfunction). - Aortic valve: Valve area (VTI): 4.14 cm^2. Valve area (Vmax):   4.18 cm^2. Valve area (Vmean): 3.78 cm^2.   Blood pressure is elevated and he endorses compliance with his antihypertensive but not a low-sodium diet and he does not exercise regularly.  He denies the presence of weakness or speech abnormalities but informs me he does get fatigued easily and is working part-time at his job at [employer]. Last visit to neurology was in 03/2017  With regards to his diabetes he has been compliant with his medications and denies hypoglycemia or numbness in extremities but sometimes has cloudy vision.  He is yet to undergo his annual eye exam.  Past Medical History:  Diagnosis Date  . Acute combined systolic and diastolic CHF, NYHA class 3 ([place])    a. 02/2014 Echo: EF 25-30%.  . Cardiomyopathy ([place])    a. 02/2014 Echo: EF 25-30%, sev glob HK with inferolat HK->AK, mod conc LVH, Gr 2 DD, Mild MR, sev dil LA.  . Depression   . DM (diabetes mellitus) ([place])   . High cholesterol   . Hypertension   . Morbid obesity ([place])   . Tobacco abuse     Past Surgical History:  Procedure Laterality Date    . CATARACT EXTRACTION Right   . LEFT HEART CATHETERIZATION WITH CORONARY ANGIOGRAM N/A 03/04/2014   Procedure: LEFT HEART CATHETERIZATION WITH CORONARY ANGIOGRAM;  Surgeon: Burnell Blanks, MD;  Location: [facility] CATH LAB;  Service: Cardiovascular;  Laterality: N/A;  . TEE WITHOUT CARDIOVERSION N/A 02/04/2017   Procedure: TRANSESOPHAGEAL ECHOCARDIOGRAM (TEE);  Surgeon: Dorothy Spark, MD;  Location: [facility] ENDOSCOPY;  Service: Cardiovascular;  Laterality: N/A;    No Known Allergies   Outpatient Medications Prior to Visit  Medication Sig Dispense Refill  . acetaminophen (TYLENOL) 500 MG tablet Take 500 mg by mouth every 6 (six) hours as needed for mild pain.    Marland Kitchen aspirin 325 MG tablet Take 1 tablet (325 mg total) by mouth daily. 30 tablet 5  . glucose blood (ONE TOUCH ULTRA TEST) test strip Use as instructed 30 each 12  . carvedilol (COREG) 6.25 MG tablet Take 1 tablet (6.25 mg total) by mouth 2 (two) times daily with a meal. 60 tablet 1  . furosemide (LASIX) 40 MG tablet Take 1 tablet (40 mg total) by mouth daily. 30 tablet 1  . glipiZIDE (GLUCOTROL) 5 MG tablet Take 1 tablet (5 mg total) by mouth 2 (two) times daily before a meal. 60 tablet 1  . hydrALAZINE (APRESOLINE) 25 MG tablet Take 1 tablet (25 mg total) by mouth 3 (three) times daily. 90 tablet 1  . simvastatin (ZOCOR) 20 MG tablet Take 1 tablet (20 mg  total) by mouth at bedtime. 30 tablet 1  . ezetimibe (ZETIA) 10 MG tablet Take 1 tablet (10 mg total) by mouth daily. (Patient not taking: Reported on 12/03/2017) 30 tablet 1   No facility-administered medications prior to visit.     ROS Review of Systems  Constitutional: Positive for fatigue. Negative for activity change and appetite change.  HENT: Negative for sinus pressure and sore throat.   Eyes: Positive for visual disturbance.  Respiratory: Negative for cough, chest tightness and shortness of breath.   Cardiovascular: Negative for chest pain and leg swelling.   Gastrointestinal: Negative for abdominal distention, abdominal pain, constipation and diarrhea.  Endocrine: Negative.   Genitourinary: Negative for dysuria.  Musculoskeletal: Negative for joint swelling and myalgias.  Skin: Negative for rash.  Allergic/Immunologic: Negative.   Neurological: Negative for weakness, light-headedness and numbness.  Psychiatric/Behavioral: Negative for dysphoric mood and suicidal ideas.    Objective:  BP (!) 160/104   Pulse 75   Temp 98.3 F (36.8 C) (Oral)   Ht '5\' 9"'  (1.753 m)   Wt 257 lb 9.6 oz (116.8 kg)   SpO2 99%   BMI 38.04 kg/m   BP/Weight 12/03/2017 06/20/2017 07/25/9832  Systolic BP 825 053 976  Diastolic BP 734 89 193  Wt. (Lbs) 257.6 254 -  BMI 38.04 37.51 -      Physical Exam  Constitutional: He is oriented to person, place, and time. He appears well-developed and well-nourished.  Cardiovascular: Normal rate, normal heart sounds and intact distal pulses.  No murmur heard. Pulmonary/Chest: Effort normal and breath sounds normal. He has no wheezes. He has no rales. He exhibits no tenderness.  Abdominal: Soft. Bowel sounds are normal. He exhibits no distension and no mass. There is no tenderness.  Musculoskeletal: Normal range of motion.  Neurological: He is alert and oriented to person, place, and time.  Dysarthria 4+/5 strength in LUE and LLE 5/5 strength in RUE and RLE  Skin: Skin is warm.  Psychiatric: He has a normal mood and affect.     Lab Results  Component Value Date   HGBA1C 6.4 12/03/2017    Assessment & Plan:   1. Type 2 diabetes mellitus without complication, without long-term current use of insulin (HCC) A1C 6.4 Continue current regimen Diabetic diet - POCT glucose (manual entry) - POCT glycosylated hemoglobin (Hb A1C) - CMP14+EGFR - carvedilol (COREG) 12.5 MG tablet; Take 1 tablet (12.5 mg total) by mouth 2 (two) times daily with a meal.  Dispense: 60 tablet; Refill: 6 - furosemide (LASIX) 40 MG tablet;  Take 1 tablet (40 mg total) by mouth daily.  Dispense: 30 tablet; Refill: 6 - glipiZIDE (GLUCOTROL) 5 MG tablet; Take 1 tablet (5 mg total) by mouth 2 (two) times daily before a meal.  Dispense: 60 tablet; Refill: 6  2. Essential hypertension Controlled Increased dose of carvedilol Low-sodium diet Low-dose ACE inhibitor at next visit if blood pressure holds up - hydrALAZINE (APRESOLINE) 25 MG tablet; Take 1 tablet (25 mg total) by mouth 3 (three) times daily.  Dispense: 90 tablet; Refill: 6  3. Mixed hyperlipidemia Switched from simvastatin to atorvastatin due to increased cardiovascular risk - atorvastatin (LIPITOR) 80 MG tablet; Take 1 tablet (80 mg total) by mouth daily.  Dispense: 30 tablet; Refill: 6  4. Chronic combined systolic and diastolic congestive heart failure (HCC) EF 45-50% Euvolemic Continue Lasix  5. History of stroke With slight dysarthria and slight left-sided weakness Currently working part-time Risk factor modification ASA 376m  Meds ordered  this encounter  Medications  . atorvastatin (LIPITOR) 80 MG tablet    Sig: Take 1 tablet (80 mg total) by mouth daily.    Dispense:  30 tablet    Refill:  6    Discontinue simvastatin  . carvedilol (COREG) 12.5 MG tablet    Sig: Take 1 tablet (12.5 mg total) by mouth 2 (two) times daily with a meal.    Dispense:  60 tablet    Refill:  6    Discontinue previous dose  . furosemide (LASIX) 40 MG tablet    Sig: Take 1 tablet (40 mg total) by mouth daily.    Dispense:  30 tablet    Refill:  6  . glipiZIDE (GLUCOTROL) 5 MG tablet    Sig: Take 1 tablet (5 mg total) by mouth 2 (two) times daily before a meal.    Dispense:  60 tablet    Refill:  6  . hydrALAZINE (APRESOLINE) 25 MG tablet    Sig: Take 1 tablet (25 mg total) by mouth 3 (three) times daily.    Dispense:  90 tablet    Refill:  6    Follow-up: Return in about 3 months (around 03/03/2018) for Follow-up of diabetes and hypertension.   Arnoldo Morale  MD

## 2017-12-05 ENCOUNTER — Telehealth: Payer: Self-pay

## 2017-12-05 NOTE — Telephone Encounter (Signed)
Pt was called and informed of normal lab results. 

## 2017-12-12 ENCOUNTER — Ambulatory Visit: Payer: Self-pay | Admitting: Nurse Practitioner

## 2018-01-15 MED FILL — ?CARVEDILOL 12.5 MG TABLET: 12.5 | 30 days supply | Qty: 60 | Fill #1

## 2018-01-15 MED FILL — hydrALAZINE HCL 25 MG TABS: 25 | 30 days supply | Qty: 90 | Fill #1

## 2018-01-15 MED FILL — ATORVASTATIN 80 MG TABLET: 80 | 30 days supply | Qty: 30 | Fill #1

## 2018-01-15 MED FILL — ?GLIPIZIDE 5MG TABLET: 5 | 30 days supply | Qty: 60 | Fill #1

## 2018-01-15 MED FILL — ?FUROSEMIDE 40 MG TABLET: 40 | 30 days supply | Qty: 30 | Fill #1

## 2018-02-28 ENCOUNTER — Ambulatory Visit: Payer: Self-pay | Admitting: Family Medicine

## 2018-03-04 MED FILL — CARVEDILOL 12.5 MG TABLET: 12.5 | 30 days supply | Qty: 60 | Fill #2

## 2018-03-04 MED FILL — FUROSEMIDE 40 MG TAB: 40 | 30 days supply | Qty: 30 | Fill #2

## 2018-03-04 MED FILL — ATORVASTATIN 80 MG TABLET: 80 | 30 days supply | Qty: 30 | Fill #2

## 2018-03-04 MED FILL — glipiZIDE 5 MG TABS: 5 | 30 days supply | Qty: 60 | Fill #2

## 2018-03-04 MED FILL — hydrALAZINE HCL 25 MG TABS: 25 | 30 days supply | Qty: 90 | Fill #2

## 2018-03-05 ENCOUNTER — Ambulatory Visit: Payer: Self-pay | Attending: Family Medicine | Admitting: Family Medicine

## 2018-03-05 ENCOUNTER — Encounter: Payer: Self-pay | Admitting: Family Medicine

## 2018-03-05 VITALS — BP 163/94 | HR 74 | Temp 98.1°F | Ht 69.0 in | Wt 264.4 lb

## 2018-03-05 DIAGNOSIS — Z79899 Other long term (current) drug therapy: Secondary | ICD-10-CM | POA: Insufficient documentation

## 2018-03-05 DIAGNOSIS — G4733 Obstructive sleep apnea (adult) (pediatric): Secondary | ICD-10-CM | POA: Insufficient documentation

## 2018-03-05 DIAGNOSIS — E782 Mixed hyperlipidemia: Secondary | ICD-10-CM | POA: Insufficient documentation

## 2018-03-05 DIAGNOSIS — Z7982 Long term (current) use of aspirin: Secondary | ICD-10-CM | POA: Insufficient documentation

## 2018-03-05 DIAGNOSIS — F329 Major depressive disorder, single episode, unspecified: Secondary | ICD-10-CM | POA: Insufficient documentation

## 2018-03-05 DIAGNOSIS — H538 Other visual disturbances: Secondary | ICD-10-CM | POA: Insufficient documentation

## 2018-03-05 DIAGNOSIS — I429 Cardiomyopathy, unspecified: Secondary | ICD-10-CM | POA: Insufficient documentation

## 2018-03-05 DIAGNOSIS — Z8673 Personal history of transient ischemic attack (TIA), and cerebral infarction without residual deficits: Secondary | ICD-10-CM

## 2018-03-05 DIAGNOSIS — Z7984 Long term (current) use of oral hypoglycemic drugs: Secondary | ICD-10-CM | POA: Insufficient documentation

## 2018-03-05 DIAGNOSIS — E1121 Type 2 diabetes mellitus with diabetic nephropathy: Secondary | ICD-10-CM | POA: Insufficient documentation

## 2018-03-05 DIAGNOSIS — I10 Essential (primary) hypertension: Secondary | ICD-10-CM

## 2018-03-05 DIAGNOSIS — I110 Hypertensive heart disease with heart failure: Secondary | ICD-10-CM | POA: Insufficient documentation

## 2018-03-05 DIAGNOSIS — Z9889 Other specified postprocedural states: Secondary | ICD-10-CM | POA: Insufficient documentation

## 2018-03-05 DIAGNOSIS — I5042 Chronic combined systolic (congestive) and diastolic (congestive) heart failure: Secondary | ICD-10-CM | POA: Insufficient documentation

## 2018-03-05 LAB — GLUCOSE, POCT (MANUAL RESULT ENTRY): POC Glucose: 183 mg/dl — AB (ref 70–99)

## 2018-03-05 LAB — POCT GLYCOSYLATED HEMOGLOBIN (HGB A1C): Hemoglobin A1C: 6.5

## 2018-03-05 MED ORDER — ATORVASTATIN CALCIUM 80 MG PO TABS: 80.0000 mg | ORAL_TABLET | Freq: Every day | ORAL | 6 refills | Status: DC

## 2018-03-05 MED ORDER — EZETIMIBE 10 MG PO TABS: 10.0000 mg | ORAL_TABLET | Freq: Every day | ORAL | 1 refills | Status: DC

## 2018-03-05 MED ORDER — HYDRALAZINE HCL 25 MG PO TABS: 25.0000 mg | ORAL_TABLET | Freq: Three times a day (TID) | ORAL | 6 refills | Status: DC

## 2018-03-05 MED ORDER — FUROSEMIDE 40 MG PO TABS: 40.0000 mg | ORAL_TABLET | Freq: Every day | ORAL | 6 refills | Status: DC

## 2018-03-05 MED ORDER — CARVEDILOL 12.5 MG PO TABS: 12.5000 mg | ORAL_TABLET | Freq: Two times a day (BID) | ORAL | 6 refills | Status: DC

## 2018-03-05 MED ORDER — GLIPIZIDE 5 MG PO TABS: 5.0000 mg | ORAL_TABLET | Freq: Two times a day (BID) | ORAL | 6 refills | Status: DC

## 2018-03-05 NOTE — Progress Notes (Signed)
Patient has been out of medication since last weekend.

## 2018-03-05 NOTE — Patient Instructions (Signed)

## 2018-03-05 NOTE — Progress Notes (Signed)
Subjective:  Patient ID: Matthew [name], male    DOB: [birth year]  Age: 52 y.o. MRN: [id number]  CC: Diabetes and Hypertension   HPI Matthew [name] is a 52 year old male with a history of type 2 diabetes mellitus (A1c 6.5), hypertension, sleep apnea on CPAP at night, previous tobacco abuse, systolic and diastolic CHF (EF 88-91% from 01/2017), history of bilateral ACA infarct in 01/2017 with slight left hemiparesis and dysarthria here  for a follow-up visit.  Blood pressure is elevated and he has been out of his antihypertensives because he was informed by the pharmacy he would need a blue card to obtain his medications.   With regards to his diabetes he has been out of his glipizide but denies hypoglycemia or numbness in extremities.  He complains of left eye blurry vision which he has had for some time and visual acuity performed revealed 20/200 in his left eye and 20/70 in his right eye he is yet to undergo his annual eye exam due to lack of medical coverage. He denies recent falls, denies chest pains, shortness of breath. He has no other acute concerns today.  Past Medical History:  Diagnosis Date  . Acute combined systolic and diastolic CHF, NYHA class 3 ([place])    a. 02/2014 Echo: EF 25-30%.  . Cardiomyopathy ([place])    a. 02/2014 Echo: EF 25-30%, sev glob HK with inferolat HK->AK, mod conc LVH, Gr 2 DD, Mild MR, sev dil LA.  . Depression   . DM (diabetes mellitus) ([place])   . High cholesterol   . Hypertension   . Morbid obesity ([place])   . Tobacco abuse     Past Surgical History:  Procedure Laterality Date  . CATARACT EXTRACTION Right   . LEFT HEART CATHETERIZATION WITH CORONARY ANGIOGRAM N/A 03/04/2014   Procedure: LEFT HEART CATHETERIZATION WITH CORONARY ANGIOGRAM;  Surgeon: Burnell Blanks, MD;  Location: [facility] CATH LAB;  Service: Cardiovascular;  Laterality: N/A;  . TEE WITHOUT CARDIOVERSION N/A 02/04/2017   Procedure: TRANSESOPHAGEAL ECHOCARDIOGRAM (TEE);  Surgeon: Dorothy Spark, MD;  Location: [facility] ENDOSCOPY;  Service: Cardiovascular;  Laterality: N/A;    No Known Allergies   Outpatient Medications Prior to Visit  Medication Sig Dispense Refill  . acetaminophen (TYLENOL) 500 MG tablet Take 500 mg by mouth every 6 (six) hours as needed for mild pain.    Marland Kitchen aspirin 325 MG tablet Take 1 tablet (325 mg total) by mouth daily. 30 tablet 5  . glucose blood (ONE TOUCH ULTRA TEST) test strip Use as instructed 30 each 12  . atorvastatin (LIPITOR) 80 MG tablet Take 1 tablet (80 mg total) by mouth daily. 30 tablet 6  . carvedilol (COREG) 12.5 MG tablet Take 1 tablet (12.5 mg total) by mouth 2 (two) times daily with a meal. 60 tablet 6  . furosemide (LASIX) 40 MG tablet Take 1 tablet (40 mg total) by mouth daily. 30 tablet 6  . glipiZIDE (GLUCOTROL) 5 MG tablet Take 1 tablet (5 mg total) by mouth 2 (two) times daily before a meal. 60 tablet 6  . hydrALAZINE (APRESOLINE) 25 MG tablet Take 1 tablet (25 mg total) by mouth 3 (three) times daily. 90 tablet 6  . ezetimibe (ZETIA) 10 MG tablet Take 1 tablet (10 mg total) by mouth daily. (Patient not taking: Reported on 12/03/2017) 30 tablet 1   No facility-administered medications prior to visit.     ROS Review of Systems  Constitutional: Negative for activity change and appetite change.  HENT:  Negative for sinus pressure and sore throat.   Eyes: Positive for visual disturbance.  Respiratory: Negative for cough, chest tightness and shortness of breath.   Cardiovascular: Negative for chest pain and leg swelling.  Gastrointestinal: Negative for abdominal distention, abdominal pain, constipation and diarrhea.  Endocrine: Negative.   Genitourinary: Negative for dysuria.  Musculoskeletal: Negative for joint swelling and myalgias.  Skin: Negative for rash.  Allergic/Immunologic: Negative.   Neurological: Positive for weakness. Negative for light-headedness and numbness.  Psychiatric/Behavioral: Negative for dysphoric mood and  suicidal ideas.    Objective:  BP (!) 163/94   Pulse 74   Temp 98.1 F (36.7 C) (Oral)   Ht '5\' 9"'  (1.753 m)   Wt 264 lb 6.4 oz (119.9 kg)   SpO2 98%   BMI 39.05 kg/m   BP/Weight 03/05/2018 17/00/1749 03/20/9674  Systolic BP 916 384 665  Diastolic BP 94 993 89  Wt. (Lbs) 264.4 257.6 254  BMI 39.05 38.04 37.51      Physical Exam  Constitutional: He is oriented to person, place, and time. He appears well-developed and well-nourished.  Cardiovascular: Normal rate, normal heart sounds and intact distal pulses.  No murmur heard. Pulmonary/Chest: Effort normal and breath sounds normal. He has no wheezes. He has no rales. He exhibits no tenderness.  Abdominal: Soft. Bowel sounds are normal. He exhibits no distension and no mass. There is no tenderness.  Musculoskeletal: Normal range of motion.  Neurological: He is alert and oriented to person, place, and time.  Slight left facial nerve palsy Motor strength-4+/5 in left lower extremity and left upper extremity; 5/5 in  right upper and right lower extremity  Skin: Skin is warm and dry.  Psychiatric: He has a normal mood and affect.     CMP Latest Ref Rng & Units 12/03/2017 04/22/2017 01/31/2017  Glucose 65 - 99 mg/dL 99 177(H) 146(H)  BUN 6 - 24 mg/dL '11 12 13  ' Creatinine 0.76 - 1.27 mg/dL 1.14 1.34(H) 1.40(H)  Sodium 134 - 144 mmol/L 140 139 140  Potassium 3.5 - 5.2 mmol/L 4.1 3.8 3.9  Chloride 96 - 106 mmol/L 105 96 104  CO2 20 - 29 mmol/L 21 24 -  Calcium 8.7 - 10.2 mg/dL 9.2 9.4 -  Total Protein 6.0 - 8.5 g/dL 7.1 7.0 -  Total Bilirubin 0.0 - 1.2 mg/dL 0.2 0.3 -  Alkaline Phos 39 - 117 IU/L 79 68 -  AST 0 - 40 IU/L 20 19 -  ALT 0 - 44 IU/L 29 20 -    Lab Results  Component Value Date   HGBA1C 6.5 03/05/2018    Assessment & Plan:   1. Controlled type 2 diabetes mellitus with diabetic nephropathy, without long-term current use of insulin (HCC) Controlled Diabetic diet, lifestyle modifications - POCT glucose (manual  entry) - POCT glycosylated hemoglobin (Hb A1C) - Lipid panel; Future - CMP14+EGFR; Future - Microalbumin/Creatinine Ratio, Urine; Future - glipiZIDE (GLUCOTROL) 5 MG tablet; Take 1 tablet (5 mg total) by mouth 2 (two) times daily before a meal.  Dispense: 60 tablet; Refill: 6  2. Essential hypertension Uncontrolled due to running out of medications which I have refilled We will speak with the pharmacy to assist with his medications Low-sodium, DASH diet - carvedilol (COREG) 12.5 MG tablet; Take 1 tablet (12.5 mg total) by mouth 2 (two) times daily with a meal.  Dispense: 60 tablet; Refill: 6 - hydrALAZINE (APRESOLINE) 25 MG tablet; Take 1 tablet (25 mg total) by mouth 3 (three) times daily.  Dispense: 90 tablet; Refill: 6  3. Mixed hyperlipidemia Emphasized the need to be compliant with atorvastatin - atorvastatin (LIPITOR) 80 MG tablet; Take 1 tablet (80 mg total) by mouth daily.  Dispense: 30 tablet; Refill: 6 - ezetimibe (ZETIA) 10 MG tablet; Take 1 tablet (10 mg total) by mouth daily.  Dispense: 30 tablet; Refill: 1  4. OSA (obstructive sleep apnea) Continue with CPAP  5. History of stroke Risk factor modification Fall precaution due to slight left-sided weakness  6. Chronic combined systolic and diastolic congestive heart failure (HCC) EF 45-50% from 01/2017 Euvolemic - furosemide (LASIX) 40 MG tablet; Take 1 tablet (40 mg total) by mouth daily.  Dispense: 30 tablet; Refill: 6  7. Blurry vision, left eye He needs to see an ophthalmologist statin and has been educated on community resources available for this: I will place ophthalmology referral but his lack of medical coverage will be a limitation.   Meds ordered this encounter  Medications  . carvedilol (COREG) 12.5 MG tablet    Sig: Take 1 tablet (12.5 mg total) by mouth 2 (two) times daily with a meal.    Dispense:  60 tablet    Refill:  6    Discontinue previous dose  . hydrALAZINE (APRESOLINE) 25 MG tablet    Sig:  Take 1 tablet (25 mg total) by mouth 3 (three) times daily.    Dispense:  90 tablet    Refill:  6  . furosemide (LASIX) 40 MG tablet    Sig: Take 1 tablet (40 mg total) by mouth daily.    Dispense:  30 tablet    Refill:  6  . glipiZIDE (GLUCOTROL) 5 MG tablet    Sig: Take 1 tablet (5 mg total) by mouth 2 (two) times daily before a meal.    Dispense:  60 tablet    Refill:  6  . atorvastatin (LIPITOR) 80 MG tablet    Sig: Take 1 tablet (80 mg total) by mouth daily.    Dispense:  30 tablet    Refill:  6  . ezetimibe (ZETIA) 10 MG tablet    Sig: Take 1 tablet (10 mg total) by mouth daily.    Dispense:  30 tablet    Refill:  1    Follow-up: Return in about 3 months (around 06/05/2018) for follow up of chronic medical conditions.   Charlott Rakes MD

## 2018-03-07 ENCOUNTER — Ambulatory Visit: Payer: Self-pay | Attending: Family Medicine

## 2018-03-07 DIAGNOSIS — E1121 Type 2 diabetes mellitus with diabetic nephropathy: Secondary | ICD-10-CM | POA: Insufficient documentation

## 2018-03-07 NOTE — Progress Notes (Signed)
Patient here for lab visit only 

## 2018-03-08 LAB — CMP14+EGFR
ALT: 21 IU/L (ref 0–44)
AST: 16 IU/L (ref 0–40)
Albumin/Globulin Ratio: 1.7 (ref 1.2–2.2)
Albumin: 4.2 g/dL (ref 3.5–5.5)
Alkaline Phosphatase: 73 IU/L (ref 39–117)
BUN/Creatinine Ratio: 7 — ABNORMAL LOW (ref 9–20)
BUN: 9 mg/dL (ref 6–24)
Bilirubin Total: 0.5 mg/dL (ref 0.0–1.2)
CO2: 23 mmol/L (ref 20–29)
Calcium: 8.9 mg/dL (ref 8.7–10.2)
Chloride: 102 mmol/L (ref 96–106)
Creatinine, Ser: 1.34 mg/dL — ABNORMAL HIGH (ref 0.76–1.27)
GFR calc Af Amer: 70 mL/min/{1.73_m2} (ref >59)
GFR calc non Af Amer: 61 mL/min/{1.73_m2} (ref >59)
Globulin, Total: 2.5 g/dL (ref 1.5–4.5)
Glucose: 144 mg/dL — ABNORMAL HIGH (ref 65–99)
Potassium: 4 mmol/L (ref 3.5–5.2)
Sodium: 142 mmol/L (ref 134–144)
Total Protein: 6.7 g/dL (ref 6.0–8.5)

## 2018-03-08 LAB — LIPID PANEL
Chol/HDL Ratio: 4 ratio (ref 0.0–5.0)
Cholesterol, Total: 144 mg/dL (ref 100–199)
HDL: 36 mg/dL — ABNORMAL LOW (ref >39)
LDL Calculated: 73 mg/dL (ref 0–99)
Triglycerides: 175 mg/dL — ABNORMAL HIGH (ref 0–149)
VLDL Cholesterol Cal: 35 mg/dL (ref 5–40)

## 2018-03-08 LAB — MICROALBUMIN / CREATININE URINE RATIO
Creatinine, Urine: 181.6 mg/dL
Microalb/Creat Ratio: 644.5 mg/g creat — ABNORMAL HIGH (ref 0.0–30.0)
Microalbumin, Urine: 1170.5 ug/mL

## 2018-03-12 ENCOUNTER — Telehealth: Payer: Self-pay

## 2018-03-12 NOTE — Telephone Encounter (Signed)
Patient was called and informed of lab results. 

## 2018-04-01 MED FILL — PREDNISOLONE AC 1% EYE DROP: 1 | 37 days supply | Qty: 10 | Fill #0

## 2018-04-01 MED FILL — LATANOPROST 0.005% EYE DRP: 0.005 | 37 days supply | Qty: 5 | Fill #0

## 2018-04-01 MED FILL — KETOROLAC 0.4% OPHTH SOLN: 0.4 | 18 days supply | Qty: 5 | Fill #0

## 2018-04-01 MED FILL — OFLOXACIN 0.3% EYE DROPS: 0.3 | 30 days supply | Qty: 10 | Fill #0

## 2018-04-23 MED FILL — glipiZIDE 5 MG TABS: 5 | 30 days supply | Qty: 60 | Fill #0

## 2018-04-23 MED FILL — CARVEDILOL 12.5 MG TABLET: 12.5 | 30 days supply | Qty: 60 | Fill #0

## 2018-04-23 MED FILL — ATORVASTATIN 80 MG TABLET: 80 | 30 days supply | Qty: 30 | Fill #0

## 2018-04-23 MED FILL — FUROSEMIDE 40 MG TAB: 40 | 30 days supply | Qty: 30 | Fill #0

## 2018-04-23 MED FILL — hydrALAZINE HCL 25 MG TABS: 25 | 30 days supply | Qty: 90 | Fill #0

## 2018-04-25 MED FILL — KETOROLAC 0.4% OPHTH SOLN: 0.4 | 18 days supply | Qty: 5 | Fill #1

## 2018-06-03 ENCOUNTER — Ambulatory Visit: Payer: Self-pay | Admitting: Family Medicine

## 2018-06-06 IMAGING — CT CT ANGIO CHEST-ABD-PELV FOR DISSECTION W/ AND WO/W CM
2 of 7 series · 14 of 46 positions shown, 16 images · IV contrast (isovue)
Comparison: Chest radiographs dated 01/31/2017

CLINICAL DATA: Chest pain and hypertension following TOTENA, evaluate
for aortic dissection

EXAM:
CT ANGIOGRAPHY CHEST, ABDOMEN AND PELVIS
TECHNIQUE: Multidetector CT imaging through the chest, abdomen and pelvis was
performed using the standard protocol during bolus administration of
intravenous contrast. Multiplanar reconstructed images and MIPs were
obtained and reviewed to evaluate the vascular anatomy.
CONTRAST:  100 mL Isovue 370 IV

[Series 6: dissection 2mm · axial · 0.67mm/px · z∈[+910,+1500]mm · 11 of 331 slices shown, 13 images]
[im 18/331  soft-tissue]
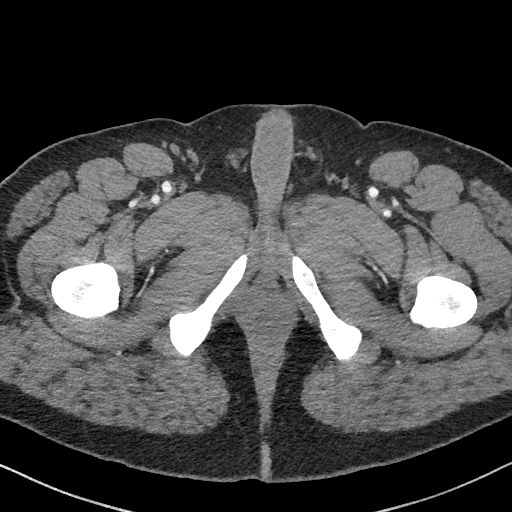
[im 18/331  bone]
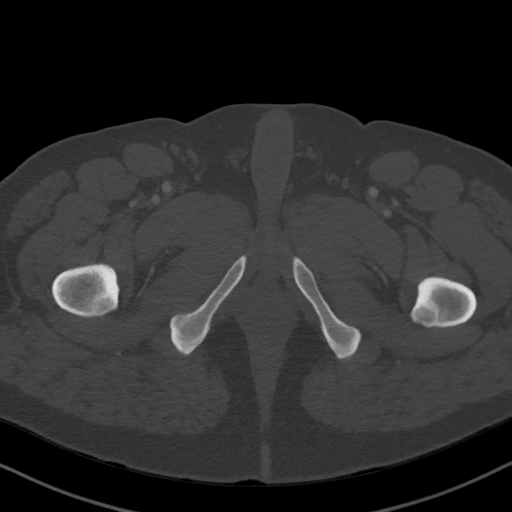
[im 53/331  soft-tissue]
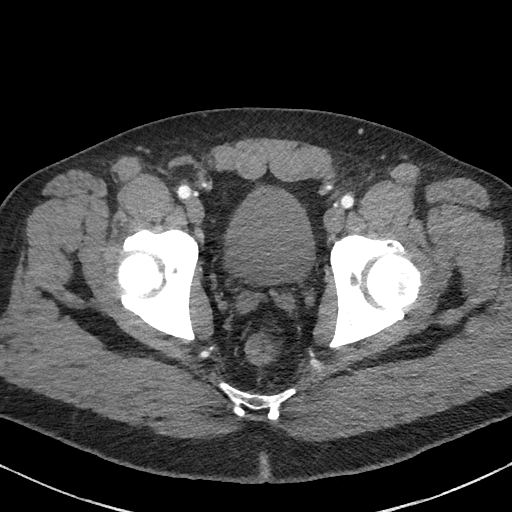
[im 87/331  soft-tissue]
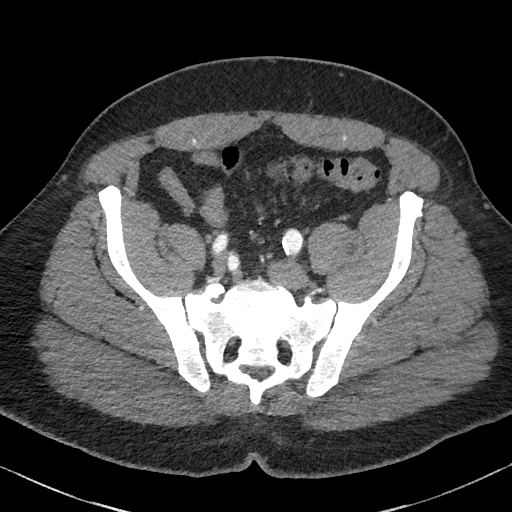
[im 105/331  soft-tissue]
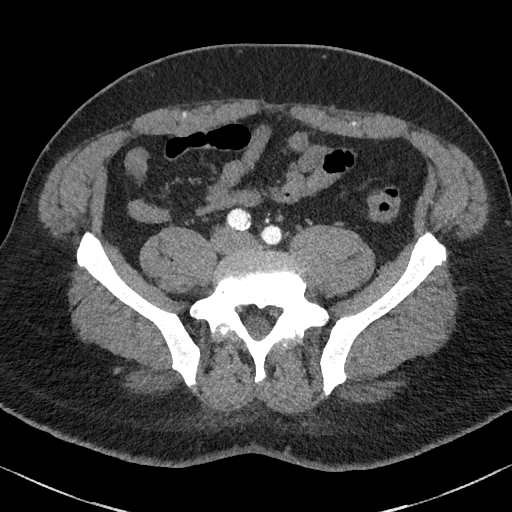
[im 139/331  soft-tissue]
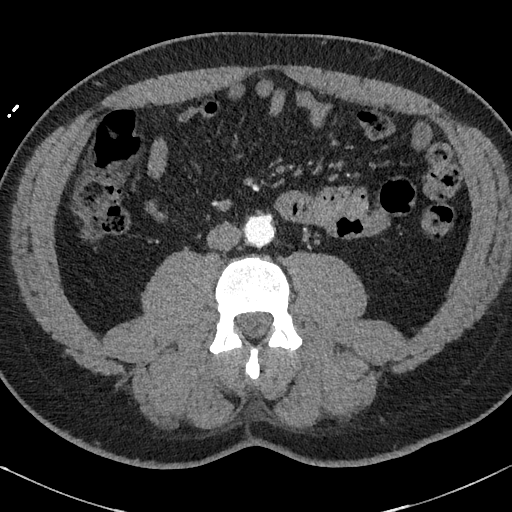
[im 174/331  soft-tissue]
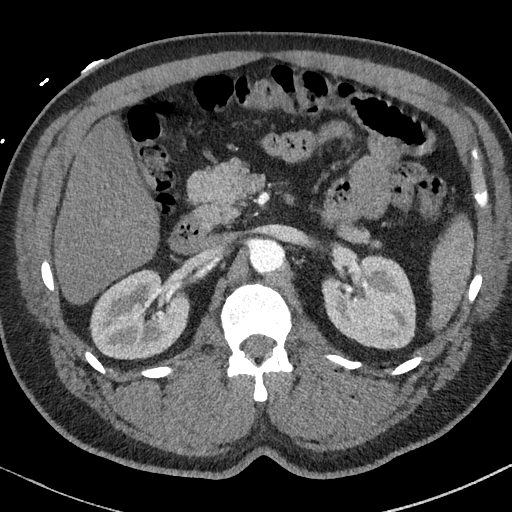
[im 192/331  soft-tissue]
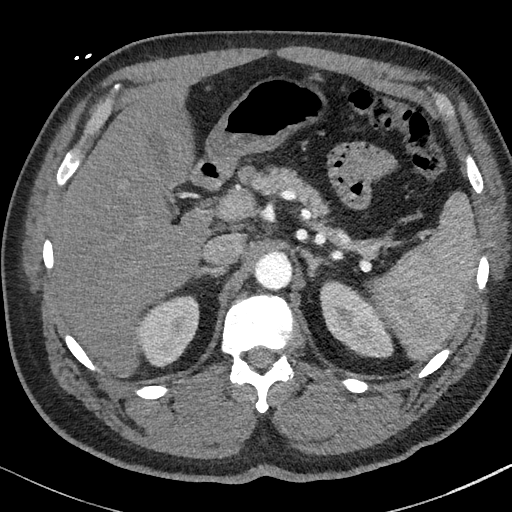
[im 226/331  soft-tissue]
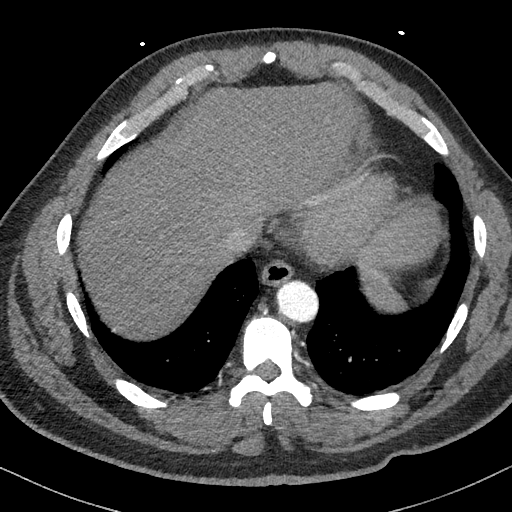
[im 244/331  soft-tissue]
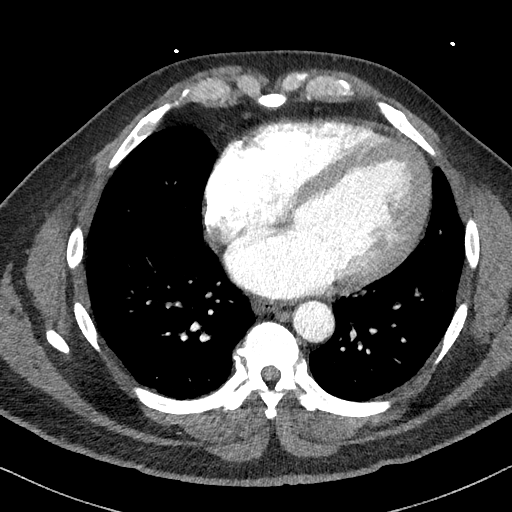
[im 244/331  bone]
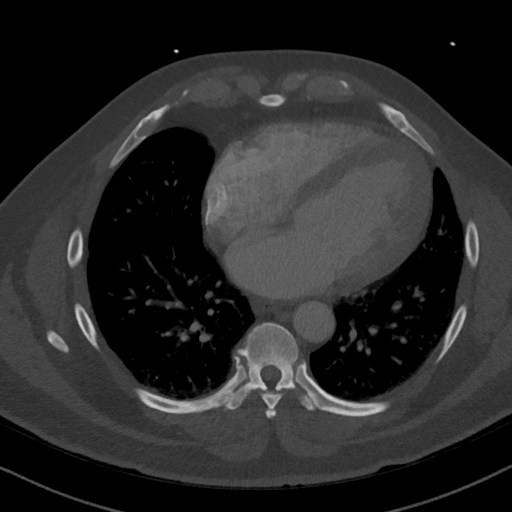
[im 278/331  soft-tissue]
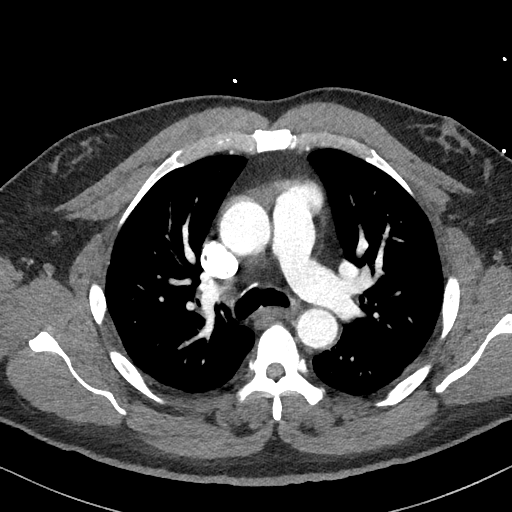
[im 313/331  soft-tissue]
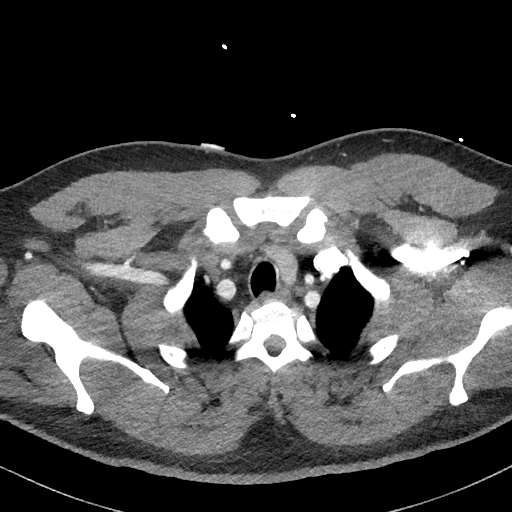

[Series 9: dissection 2mm cor · coronal · 0.74mm/px · 3 of 140 slices shown]
[im 35/140  soft-tissue]
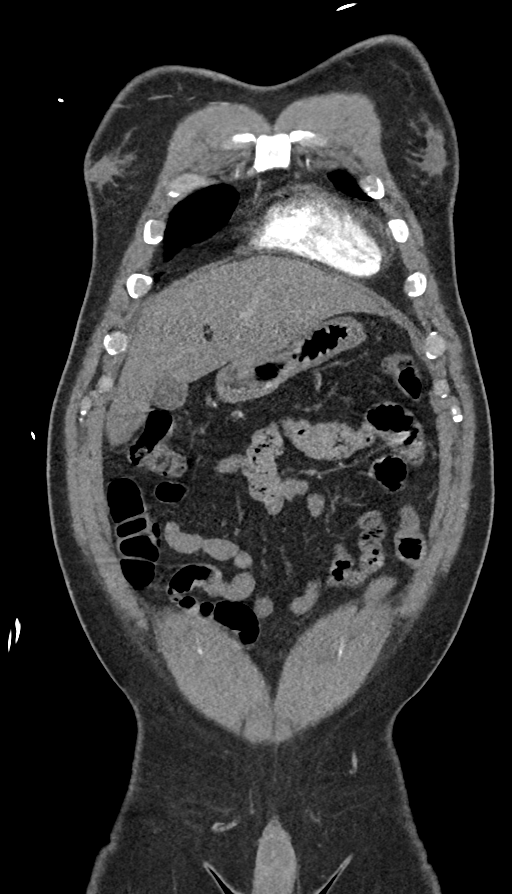
[im 70/140  soft-tissue]
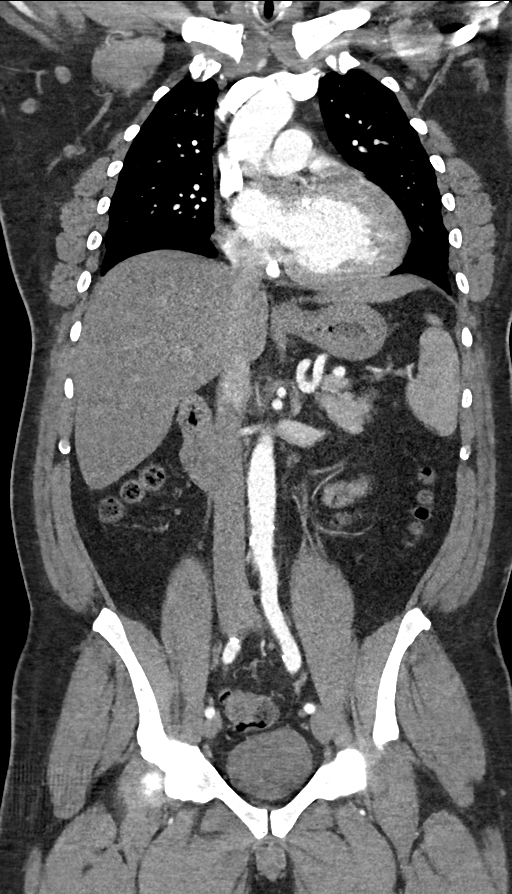
[im 105/140  soft-tissue]
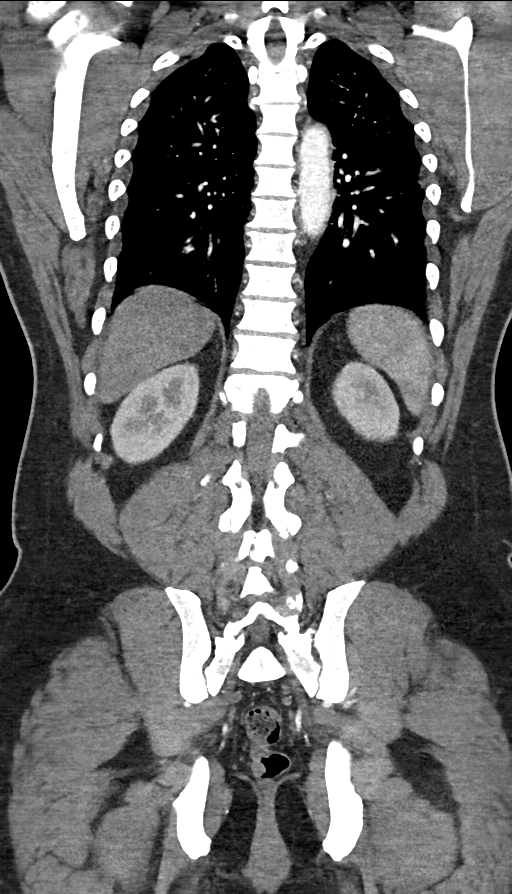

[14 of 46 positions shown; findings below may reference images not displayed]

FINDINGS: CTA CHEST FINDINGS

Cardiovascular: On unenhanced CT, there is no evidence of intramural
hematoma.

Preferential opacification of the thoracic aorta. No evidence of
thoracic aortic aneurysm or dissection.

The heart is top-normal in size.  No pericardial effusion.

Mediastinum/Nodes: No suspicious mediastinal lymphadenopathy.

Visualized thyroid is unremarkable.

Lungs/Pleura: Evaluation of the lung parenchyma is mildly
constrained by respiratory motion.

No suspicious pulmonary nodules.

No focal consolidation.

No pleural effusion or pneumothorax.

Musculoskeletal: Very mild degenerative changes of the mid thoracic
spine.

Review of the MIP images confirms the above findings.

CTA ABDOMEN AND PELVIS FINDINGS

VASCULAR

Aorta: Patent. No evidence of abdominal aortic aneurysm or
dissection. Mild atherosclerotic calcifications.

Celiac: Patent.

SMA: Patent.

Renals: Bilateral renal arteries are patent.

IMA: Patent.

Inflow: Patent.  Mild atherosclerotic calcifications.

Veins: Unremarkable.

Review of the MIP images confirms the above findings.

NON-VASCULAR

Hepatobiliary: Liver is grossly unremarkable. Mild hepatic steatosis
with focal fatty sparing along the gallbladder fossa.

Gallbladder is unremarkable. No intrahepatic or extrahepatic ductal
dilatation.

Pancreas: Within normal limits.

Spleen: Within normal limits.

Adrenals/Urinary Tract: Adrenal glands are within normal limits.

Kidneys are within normal limits.  No hydronephrosis.

Bladder is within normal limits.

Stomach/Bowel: Stomach is within normal limits.

No evidence of bowel obstruction.

Normal appendix (series 6/ image 216).

Lymphatic: No suspicious abdominopelvic lymphadenopathy.

Reproductive: Prostate is unremarkable.

Other: No abdominopelvic ascites.

Small fat containing bilateral inguinal hernias (series 6/image
301).

Musculoskeletal: Mild degenerative changes of the lumbar spine.

Review of the MIP images confirms the above findings.
IMPRESSION: No evidence of thoracoabdominal aortic aneurysm or dissection.

No evidence of acute cardiopulmonary disease.

Mild hepatic steatosis.

## 2018-06-25 MED FILL — FUROSEMIDE 40 MG TAB: 40 | 30 days supply | Qty: 30 | Fill #1

## 2018-06-25 MED FILL — glipiZIDE 5 MG TABS: 5 | 30 days supply | Qty: 60 | Fill #1

## 2018-06-25 MED FILL — hydrALAZINE HCL 25 MG TABS: 25 | 30 days supply | Qty: 90 | Fill #1

## 2018-06-25 MED FILL — CARVEDILOL 12.5 MG TABLET: 12.5 | 30 days supply | Qty: 60 | Fill #1

## 2018-06-25 MED FILL — ATORVASTATIN 80 MG TABLET: 80 | 30 days supply | Qty: 30 | Fill #1

## 2018-06-27 MED FILL — TIMOLOL 0.5% EYE DROPS: 0.5 | 30 days supply | Qty: 5 | Fill #0

## 2018-07-11 ENCOUNTER — Encounter: Payer: Self-pay | Admitting: Family Medicine

## 2018-07-11 ENCOUNTER — Ambulatory Visit: Payer: Self-pay | Attending: Family Medicine | Admitting: Family Medicine

## 2018-07-11 VITALS — BP 158/98 | HR 64 | Temp 97.4°F | Ht 69.0 in | Wt 257.4 lb

## 2018-07-11 DIAGNOSIS — I69354 Hemiplegia and hemiparesis following cerebral infarction affecting left non-dominant side: Secondary | ICD-10-CM | POA: Insufficient documentation

## 2018-07-11 DIAGNOSIS — I6300 Cerebral infarction due to thrombosis of unspecified precerebral artery: Secondary | ICD-10-CM

## 2018-07-11 DIAGNOSIS — E1121 Type 2 diabetes mellitus with diabetic nephropathy: Secondary | ICD-10-CM

## 2018-07-11 DIAGNOSIS — I10 Essential (primary) hypertension: Secondary | ICD-10-CM

## 2018-07-11 DIAGNOSIS — Z7984 Long term (current) use of oral hypoglycemic drugs: Secondary | ICD-10-CM | POA: Insufficient documentation

## 2018-07-11 DIAGNOSIS — I110 Hypertensive heart disease with heart failure: Secondary | ICD-10-CM | POA: Insufficient documentation

## 2018-07-11 DIAGNOSIS — E782 Mixed hyperlipidemia: Secondary | ICD-10-CM

## 2018-07-11 DIAGNOSIS — Z79899 Other long term (current) drug therapy: Secondary | ICD-10-CM | POA: Insufficient documentation

## 2018-07-11 DIAGNOSIS — Z6837 Body mass index (BMI) 37.0-37.9, adult: Secondary | ICD-10-CM | POA: Insufficient documentation

## 2018-07-11 DIAGNOSIS — Z7982 Long term (current) use of aspirin: Secondary | ICD-10-CM | POA: Insufficient documentation

## 2018-07-11 DIAGNOSIS — I429 Cardiomyopathy, unspecified: Secondary | ICD-10-CM | POA: Insufficient documentation

## 2018-07-11 DIAGNOSIS — I5042 Chronic combined systolic (congestive) and diastolic (congestive) heart failure: Secondary | ICD-10-CM

## 2018-07-11 LAB — POCT GLYCOSYLATED HEMOGLOBIN (HGB A1C): HbA1c, POC (controlled diabetic range): 6.4 % (ref 0.0–7.0)

## 2018-07-11 LAB — GLUCOSE, POCT (MANUAL RESULT ENTRY): POC Glucose: 279 mg/dl — AB (ref 70–99)

## 2018-07-11 MED ORDER — FUROSEMIDE 40 MG PO TABS: 40.0000 mg | ORAL_TABLET | Freq: Every day | ORAL | 6 refills | Status: DC

## 2018-07-11 MED ORDER — ATORVASTATIN CALCIUM 80 MG PO TABS: 80.0000 mg | ORAL_TABLET | Freq: Every day | ORAL | 6 refills | Status: DC

## 2018-07-11 MED ORDER — HYDRALAZINE HCL 50 MG PO TABS: 50.0000 mg | ORAL_TABLET | Freq: Three times a day (TID) | ORAL | 6 refills | Status: DC

## 2018-07-11 MED ORDER — GLIPIZIDE 5 MG PO TABS: 5.0000 mg | ORAL_TABLET | Freq: Two times a day (BID) | ORAL | 6 refills | Status: DC

## 2018-07-11 MED ORDER — EZETIMIBE 10 MG PO TABS: 10.0000 mg | ORAL_TABLET | Freq: Every day | ORAL | 1 refills | Status: DC

## 2018-07-11 MED ORDER — ASPIRIN 81 MG PO TABS: 81.0000 mg | ORAL_TABLET | Freq: Every day | ORAL | 6 refills | Status: DC

## 2018-07-11 MED ORDER — CARVEDILOL 12.5 MG PO TABS: 12.5000 mg | ORAL_TABLET | Freq: Two times a day (BID) | ORAL | 6 refills | Status: DC

## 2018-07-11 MED FILL — EZETIMIBE 10 MG TABS: 10 | 30 days supply | Qty: 30 | Fill #0

## 2018-07-11 MED FILL — hydrALAZINE HCL 50 MG TABS: 50 | 30 days supply | Qty: 90 | Fill #0

## 2018-07-11 NOTE — Patient Instructions (Signed)

## 2018-07-11 NOTE — Progress Notes (Signed)
Subjective:  Patient ID: Matthew [name], male    DOB: 11/24/1966  Age: 52 y.o. 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 His blood pressure is elevated and he attributes it to his wife getting him upset and endorses taking his antihypertensives however at his last visit his blood pressure was also slightly elevated.  He denies chest pains or shortness of breath and has no pedal edema. With regards to his diabetes mellitus he denies hypoglycemia, neuropathy and is requesting resources for eyeglasses which we have provided to him. He does have slight left-sided weakness but this is stable and he currently works part-time at his job. He has no additional concerns today.  Past Medical History:  Diagnosis Date  . Acute combined systolic and diastolic CHF, NYHA class 3 ([place])    a. 02/2014 Echo: EF 25-30%.  . Cardiomyopathy ([place])    a. 02/2014 Echo: EF 25-30%, sev glob HK with inferolat HK->AK, mod conc LVH, Gr 2 DD, Mild MR, sev dil LA.  . Depression   . DM (diabetes mellitus) ([place])   . High cholesterol   . Hypertension   . Morbid obesity ([place])   . Tobacco abuse     Past Surgical History:  Procedure Laterality Date  . CATARACT EXTRACTION Right   . LEFT HEART CATHETERIZATION WITH CORONARY ANGIOGRAM N/A 03/04/2014   Procedure: LEFT HEART CATHETERIZATION WITH CORONARY ANGIOGRAM;  Surgeon: Burnell Blanks, MD;  Location: [facility] CATH LAB;  Service: Cardiovascular;  Laterality: N/A;  . TEE WITHOUT CARDIOVERSION N/A 02/04/2017   Procedure: TRANSESOPHAGEAL ECHOCARDIOGRAM (TEE);  Surgeon: Dorothy Spark, MD;  Location: [facility] ENDOSCOPY;  Service: Cardiovascular;  Laterality: N/A;     No Known Allergies   Outpatient Medications Prior to Visit  Medication Sig Dispense Refill  . acetaminophen (TYLENOL) 500 MG tablet Take 500 mg by mouth every 6 (six) hours as needed for mild pain.    Marland Kitchen glucose blood (ONE TOUCH ULTRA TEST) test strip Use as instructed 30 each 12  . aspirin 325 MG tablet Take 1 tablet (325 mg total) by mouth daily. 30 tablet 5  . atorvastatin (LIPITOR) 80 MG tablet Take 1 tablet (80 mg total) by mouth daily. 30 tablet 6  . carvedilol (COREG) 12.5 MG tablet Take 1 tablet (12.5 mg total) by mouth 2 (two) times daily with a meal. 60 tablet 6  . ezetimibe (ZETIA) 10 MG tablet Take 1 tablet (10 mg total) by mouth daily. 30 tablet 1  . furosemide (LASIX) 40 MG tablet Take 1 tablet (40 mg total) by mouth daily. 30 tablet 6  . glipiZIDE (GLUCOTROL) 5 MG tablet Take 1 tablet (5 mg total) by mouth 2 (two) times daily before a meal. 60 tablet 6  . hydrALAZINE (APRESOLINE) 25 MG tablet Take 1 tablet (25 mg total) by mouth 3 (three) times daily. 90 tablet 6   No facility-administered medications prior to visit.     ROS Review of Systems  Constitutional: Negative for activity change and appetite change.  HENT: Negative for sinus pressure and sore throat.   Eyes: Negative for visual disturbance.  Respiratory: Negative for cough, chest tightness and shortness of breath.   Cardiovascular: Negative for  chest pain and leg swelling.  Gastrointestinal: Negative for abdominal distention, abdominal pain, constipation and diarrhea.  Endocrine: Negative.   Genitourinary: Negative for dysuria.  Musculoskeletal: Negative for joint swelling and myalgias.  Skin: Negative for rash.  Allergic/Immunologic: Negative.   Neurological: Negative for weakness, light-headedness and numbness.  Psychiatric/Behavioral: Negative for dysphoric mood and suicidal ideas.    Objective:  BP (!) 158/98   Pulse 64   Temp (!) 97.4 F (36.3 C) (Oral)   Ht 5\' 9"  (1.753 m)   Wt 257 lb 6.4 oz  (116.8 kg)   SpO2 96%   BMI 38.01 kg/m   BP/Weight 07/11/2018 03/05/2018 29/93/7169  Systolic BP 678 938 101  Diastolic BP 98 94 751  Wt. (Lbs) 257.4 264.4 257.6  BMI 38.01 39.05 38.04      Physical Exam  Constitutional: He is oriented to person, place, and time. He appears well-developed and well-nourished.  Cardiovascular: Normal rate, normal heart sounds and intact distal pulses.  No murmur heard. Pulmonary/Chest: Effort normal and breath sounds normal. He has no wheezes. He has no rales. He exhibits no tenderness.  Abdominal: Soft. Bowel sounds are normal. He exhibits no distension and no mass. There is no tenderness.  Musculoskeletal: Normal range of motion.  Neurological: He is alert and oriented to person, place, and time.  Slightly reduced motor strength in left upper and left lower extremities  Skin: Skin is warm and dry.  Psychiatric: He has a normal mood and affect.     CMP Latest Ref Rng & Units 03/07/2018 12/03/2017 04/22/2017  Glucose 65 - 99 mg/dL 144(H) 99 177(H)  BUN 6 - 24 mg/dL 9 11 12   Creatinine 0.76 - 1.27 mg/dL 1.34(H) 1.14 1.34(H)  Sodium 134 - 144 mmol/L 142 140 139  Potassium 3.5 - 5.2 mmol/L 4.0 4.1 3.8  Chloride 96 - 106 mmol/L 102 105 96  CO2 20 - 29 mmol/L 23 21 24   Calcium 8.7 - 10.2 mg/dL 8.9 9.2 9.4  Total Protein 6.0 - 8.5 g/dL 6.7 7.1 7.0  Total Bilirubin 0.0 - 1.2 mg/dL 0.5 0.2 0.3  Alkaline Phos 39 - 117 IU/L 73 79 68  AST 0 - 40 IU/L 16 20 19   ALT 0 - 44 IU/L 21 29 20     Lipid Panel     Component Value Date/Time   CHOL 144 03/07/2018 0834   TRIG 175 (H) 03/07/2018 0834   HDL 36 (L) 03/07/2018 0834   CHOLHDL 4.0 03/07/2018 0834   CHOLHDL 5.3 02/01/2017 0409   VLDL UNABLE TO CALCULATE IF TRIGLYCERIDE OVER 400 mg/dL 02/01/2017 0409   LDLCALC 73 03/07/2018 0834    Lab Results  Component Value Date   HGBA1C 6.4 07/11/2018    Assessment & Plan:   1. Controlled type 2 diabetes mellitus with diabetic nephropathy, without  long-term current use of insulin (HCC) Controlled with A1c of 6.4 Continue current regimen Counseled on Diabetic diet, my plate method, 025 minutes of moderate intensity exercise/week Keep blood sugar logs with fasting goals of 80-120 mg/dl, random of less than 180 and in the event of sugars less than 60 mg/dl or greater than 400 mg/dl please notify the clinic ASAP. It is recommended that you undergo annual eye exams and annual foot exams. Pneumonia vaccine is recommended. - POCT glucose (manual entry) - POCT glycosylated hemoglobin (Hb A1C) - glipiZIDE (GLUCOTROL) 5 MG tablet; Take 1 tablet (5 mg total) by mouth 2 (two) times daily before a meal.  Dispense: 60 tablet; Refill:  6  2. Essential hypertension Uncontrolled Increased hydralazine dose Counseled on blood pressure goal of less than 130/80, low-sodium, DASH diet, medication compliance, 150 minutes of moderate intensity exercise per week. Discussed medication compliance, adverse effects. - hydrALAZINE (APRESOLINE) 50 MG tablet; Take 1 tablet (50 mg total) by mouth 3 (three) times daily.  Dispense: 90 tablet; Refill: 6 - carvedilol (COREG) 12.5 MG tablet; Take 1 tablet (12.5 mg total) by mouth 2 (two) times daily with a meal.  Dispense: 60 tablet; Refill: 6  3. Chronic combined systolic and diastolic congestive heart failure (HCC) EF 40 to 45% from echo of 01/2017) Euvolemic - furosemide (LASIX) 40 MG tablet; Take 1 tablet (40 mg total) by mouth daily.  Dispense: 30 tablet; Refill: 6  4. Mixed hyperlipidemia Controlled Low-cholesterol diet - ezetimibe (ZETIA) 10 MG tablet; Take 1 tablet (10 mg total) by mouth daily.  Dispense: 30 tablet; Refill: 1 - atorvastatin (LIPITOR) 80 MG tablet; Take 1 tablet (80 mg total) by mouth daily.  Dispense: 30 tablet; Refill: 6  5. Cerebrovascular accident (CVA) due to thrombosis of precerebral artery ([place]) With slightly left-sided weakness Stable Risk factor modification - aspirin 81 MG tablet;  Take 1 tablet (81 mg total) by mouth daily.  Dispense: 30 tablet; Refill: 6   Meds ordered this encounter  Medications  . hydrALAZINE (APRESOLINE) 50 MG tablet    Sig: Take 1 tablet (50 mg total) by mouth 3 (three) times daily.    Dispense:  90 tablet    Refill:  6    Discontinue previous dose  . glipiZIDE (GLUCOTROL) 5 MG tablet    Sig: Take 1 tablet (5 mg total) by mouth 2 (two) times daily before a meal.    Dispense:  60 tablet    Refill:  6  . furosemide (LASIX) 40 MG tablet    Sig: Take 1 tablet (40 mg total) by mouth daily.    Dispense:  30 tablet    Refill:  6  . ezetimibe (ZETIA) 10 MG tablet    Sig: Take 1 tablet (10 mg total) by mouth daily.    Dispense:  30 tablet    Refill:  1  . carvedilol (COREG) 12.5 MG tablet    Sig: Take 1 tablet (12.5 mg total) by mouth 2 (two) times daily with a meal.    Dispense:  60 tablet    Refill:  6    Discontinue previous dose  . atorvastatin (LIPITOR) 80 MG tablet    Sig: Take 1 tablet (80 mg total) by mouth daily.    Dispense:  30 tablet    Refill:  6  . aspirin 81 MG tablet    Sig: Take 1 tablet (81 mg total) by mouth daily.    Dispense:  30 tablet    Refill:  6    Follow-up: Return in about 3 months (around 10/11/2018).   Charlott Rakes MD

## 2018-07-17 MED FILL — LATANOPROST 0.005% EYE DRP: 0.005 | 37 days supply | Qty: 5 | Fill #1

## 2018-07-29 MED FILL — glipiZIDE 5 MG TABS: 5 | 30 days supply | Qty: 60 | Fill #2

## 2018-07-29 MED FILL — CARVEDILOL 12.5 MG TABLET: 12.5 | 30 days supply | Qty: 60 | Fill #2

## 2018-07-29 MED FILL — ATORVASTATIN 80 MG TABLET: 80 | 30 days supply | Qty: 30 | Fill #2

## 2018-09-11 MED FILL — CARVEDILOL 12.5 MG TABLET: 12.5 | 30 days supply | Qty: 60 | Fill #3

## 2018-09-11 MED FILL — TIMOLOL 0.5% EYE DROPS: 0.5 | 30 days supply | Qty: 5 | Fill #1

## 2018-09-11 MED FILL — ATORVASTATIN 80 MG TABLET: 80 | 30 days supply | Qty: 30 | Fill #3

## 2018-09-11 MED FILL — glipiZIDE 5 MG TABS: 5 | 30 days supply | Qty: 60 | Fill #3

## 2018-09-11 MED FILL — LATANOPROST 0.005% EYE DRP: 0.005 | 37 days supply | Qty: 5 | Fill #2

## 2018-09-11 MED FILL — hydrALAZINE HCL 50 MG TABS: 50 | 30 days supply | Qty: 90 | Fill #1

## 2018-09-12 ENCOUNTER — Other Ambulatory Visit: Payer: Self-pay

## 2018-09-12 ENCOUNTER — Emergency Department (HOSPITAL_COMMUNITY)
Admission: EM | Admit: 2018-09-12 | Discharge: 2018-09-12 | Disposition: A | Discharge status: Home / Self Care | Payer: No Typology Code available for payment source | Attending: Emergency Medicine | Admitting: Emergency Medicine

## 2018-09-12 ENCOUNTER — Encounter (HOSPITAL_COMMUNITY): Payer: Self-pay | Admitting: Emergency Medicine

## 2018-09-12 ENCOUNTER — Emergency Department (HOSPITAL_COMMUNITY): Payer: No Typology Code available for payment source

## 2018-09-12 DIAGNOSIS — Y999 Unspecified external cause status: Secondary | ICD-10-CM | POA: Insufficient documentation

## 2018-09-12 DIAGNOSIS — Y939 Activity, unspecified: Secondary | ICD-10-CM | POA: Insufficient documentation

## 2018-09-12 DIAGNOSIS — R0789 Other chest pain: Secondary | ICD-10-CM | POA: Insufficient documentation

## 2018-09-12 DIAGNOSIS — Y92410 Unspecified street and highway as the place of occurrence of the external cause: Secondary | ICD-10-CM | POA: Insufficient documentation

## 2018-09-12 MED ORDER — IBUPROFEN 800 MG PO TABS: 800.0000 mg | ORAL_TABLET | Freq: Four times a day (QID) | ORAL | 0 refills | Status: DC | PRN
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
## 2018-09-12 NOTE — Discharge Instructions (Addendum)
Return here as needed. Follow up with your doctor. °
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
## 2018-10-20 ENCOUNTER — Ambulatory Visit: Payer: Self-pay | Admitting: Family Medicine

## 2018-10-20 MED FILL — TIMOLOL 0.5% EYE DROPS: 0.5 | 30 days supply | Qty: 5 | Fill #2

## 2018-10-20 MED FILL — hydrALAZINE HCL 50 MG TABS: 50 | 30 days supply | Qty: 90 | Fill #2

## 2018-10-20 MED FILL — glipiZIDE 5 MG TABS: 5 | 30 days supply | Qty: 60 | Fill #4

## 2018-10-20 MED FILL — ATORVASTATIN 80 MG TABLET: 80 | 30 days supply | Qty: 30 | Fill #4

## 2018-10-20 MED FILL — CARVEDILOL 12.5 MG TABLET: 12.5 | 30 days supply | Qty: 60 | Fill #4

## 2018-11-17 ENCOUNTER — Ambulatory Visit: Payer: Self-pay | Admitting: Family Medicine

## 2018-11-18 ENCOUNTER — Ambulatory Visit: Payer: Self-pay | Attending: Family Medicine | Admitting: Licensed Clinical Social Worker

## 2018-11-18 ENCOUNTER — Encounter: Payer: Self-pay | Admitting: Family Medicine

## 2018-11-18 ENCOUNTER — Ambulatory Visit: Payer: Self-pay | Attending: Family Medicine | Admitting: Family Medicine

## 2018-11-18 VITALS — BP 149/80 | HR 66 | Temp 97.7°F | Ht 69.0 in

## 2018-11-18 DIAGNOSIS — Z8673 Personal history of transient ischemic attack (TIA), and cerebral infarction without residual deficits: Secondary | ICD-10-CM

## 2018-11-18 DIAGNOSIS — E1165 Type 2 diabetes mellitus with hyperglycemia: Secondary | ICD-10-CM

## 2018-11-18 DIAGNOSIS — F329 Major depressive disorder, single episode, unspecified: Secondary | ICD-10-CM | POA: Insufficient documentation

## 2018-11-18 DIAGNOSIS — I110 Hypertensive heart disease with heart failure: Secondary | ICD-10-CM | POA: Insufficient documentation

## 2018-11-18 DIAGNOSIS — Z7984 Long term (current) use of oral hypoglycemic drugs: Secondary | ICD-10-CM | POA: Insufficient documentation

## 2018-11-18 DIAGNOSIS — Z87891 Personal history of nicotine dependence: Secondary | ICD-10-CM | POA: Insufficient documentation

## 2018-11-18 DIAGNOSIS — E1121 Type 2 diabetes mellitus with diabetic nephropathy: Secondary | ICD-10-CM

## 2018-11-18 DIAGNOSIS — I10 Essential (primary) hypertension: Secondary | ICD-10-CM

## 2018-11-18 DIAGNOSIS — F0631 Mood disorder due to known physiological condition with depressive features: Secondary | ICD-10-CM

## 2018-11-18 DIAGNOSIS — F419 Anxiety disorder, unspecified: Secondary | ICD-10-CM

## 2018-11-18 DIAGNOSIS — F331 Major depressive disorder, recurrent, moderate: Secondary | ICD-10-CM

## 2018-11-18 DIAGNOSIS — G4730 Sleep apnea, unspecified: Secondary | ICD-10-CM | POA: Insufficient documentation

## 2018-11-18 DIAGNOSIS — Z6838 Body mass index (BMI) 38.0-38.9, adult: Secondary | ICD-10-CM | POA: Insufficient documentation

## 2018-11-18 DIAGNOSIS — Z9989 Dependence on other enabling machines and devices: Secondary | ICD-10-CM | POA: Insufficient documentation

## 2018-11-18 DIAGNOSIS — I5042 Chronic combined systolic (congestive) and diastolic (congestive) heart failure: Secondary | ICD-10-CM

## 2018-11-18 DIAGNOSIS — Z79899 Other long term (current) drug therapy: Secondary | ICD-10-CM | POA: Insufficient documentation

## 2018-11-18 DIAGNOSIS — E782 Mixed hyperlipidemia: Secondary | ICD-10-CM

## 2018-11-18 LAB — POCT GLYCOSYLATED HEMOGLOBIN (HGB A1C): Hemoglobin A1C: 6.6 % — AB (ref 4.0–5.6)

## 2018-11-18 LAB — GLUCOSE, POCT (MANUAL RESULT ENTRY): POC Glucose: 212 mg/dl — AB (ref 70–99)

## 2018-11-18 MED ORDER — LISINOPRIL 5 MG PO TABS: 5.0000 mg | ORAL_TABLET | Freq: Every day | ORAL | 6 refills | Status: DC

## 2018-11-18 MED ORDER — FLUOXETINE HCL 20 MG PO TABS: 20.0000 mg | ORAL_TABLET | Freq: Every day | ORAL | 6 refills | Status: DC

## 2018-11-18 MED ORDER — EZETIMIBE 10 MG PO TABS: 10.0000 mg | ORAL_TABLET | Freq: Every day | ORAL | 1 refills | Status: DC

## 2018-11-18 MED ORDER — GLIPIZIDE 5 MG PO TABS: 5.0000 mg | ORAL_TABLET | Freq: Two times a day (BID) | ORAL | 6 refills | Status: DC

## 2018-11-18 MED ORDER — ATORVASTATIN CALCIUM 80 MG PO TABS: 80.0000 mg | ORAL_TABLET | Freq: Every day | ORAL | 6 refills | Status: DC

## 2018-11-18 MED ORDER — CARVEDILOL 12.5 MG PO TABS: 12.5000 mg | ORAL_TABLET | Freq: Two times a day (BID) | ORAL | 6 refills | Status: DC

## 2018-11-18 MED ORDER — FUROSEMIDE 40 MG PO TABS: 40.0000 mg | ORAL_TABLET | Freq: Every day | ORAL | 6 refills | Status: DC

## 2018-11-18 MED FILL — glipiZIDE 5 MG TABS: 5 | 30 days supply | Qty: 60 | Fill #0

## 2018-11-18 MED FILL — FLUoxetine HCL 20 MG TABS: 20 | 30 days supply | Qty: 30 | Fill #0

## 2018-11-18 MED FILL — EZETIMIBE 10 MG TABS: 10 | 30 days supply | Qty: 30 | Fill #0

## 2018-11-18 MED FILL — ATORVASTATIN 80 MG TABLET: 80 | 30 days supply | Qty: 30 | Fill #0

## 2018-11-18 MED FILL — CARVEDILOL 12.5 MG TABLET: 12.5 | 30 days supply | Qty: 60 | Fill #0

## 2018-11-18 MED FILL — FUROSEMIDE 40 MG TAB: 40 | 30 days supply | Qty: 30 | Fill #0

## 2018-11-18 MED FILL — LISINOPRIL 5 MG TAB: 5 | 30 days supply | Qty: 30 | Fill #0
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
## 2018-11-18 NOTE — Patient Instructions (Signed)

## 2018-11-19 LAB — BASIC METABOLIC PANEL
BUN/Creatinine Ratio: 9 (ref 9–20)
BUN: 11 mg/dL (ref 6–24)
CO2: 23 mmol/L (ref 20–29)
Calcium: 9.5 mg/dL (ref 8.7–10.2)
Chloride: 98 mmol/L (ref 96–106)
Creatinine, Ser: 1.19 mg/dL (ref 0.76–1.27)
GFR calc Af Amer: 81 mL/min/{1.73_m2} (ref >59)
GFR calc non Af Amer: 70 mL/min/{1.73_m2} (ref >59)
Glucose: 183 mg/dL — ABNORMAL HIGH (ref 65–99)
Potassium: 3.9 mmol/L (ref 3.5–5.2)
Sodium: 138 mmol/L (ref 134–144)

## 2018-11-19 NOTE — BH Specialist Note (Signed)
Integrated Behavioral Health Initial Visit  MRN: [id number] Name: Matthew [name]  Number of [place] Clinician visits:: 1/6 Session Start time: 9:07 AM  Session End time: 9:32 AM Total time: 25 minutes  Type of Service: [place] Interpretor:No. Interpretor Name and Language: N/A   Warm Hand Off Completed.       SUBJECTIVE: Matthew [name] is a 52 y.o. male accompanied by self Patient was referred by Dr. Margarita Rana for depression and anxiety. Patient reports the following symptoms/concerns: Pt reports feelings of sadness and worry, difficulty sleeping, irritability, and racing thoughts since the stroke last year Duration of problem: Over a year; Severity of problem: moderate  OBJECTIVE: Mood: Anxious and Affect: Appropriate Risk of harm to self or others: No plan to harm self or others Pt scored positive on phq9; however, denies current SI/HI/AVH. Protective factors identified, safety plan discussed, and crisis intervention resources provided  LIFE CONTEXT: Family and Social: Pt receives support from spouse and two minor children.  School/Work: Pt is unemployed. He has a pending appeal for disability and a lawyer to assist Self-Care: Pt enjoys watching television and spending time with self to cope with stressors Life Changes: Pt has ongoing medical conditions that negatively impact mental health.   GOALS ADDRESSED: Patient will: 1. Reduce symptoms of: anxiety and depression 2. Increase knowledge and/or ability of: coping skills and healthy habits  3. Demonstrate ability to: Increase healthy adjustment to current life circumstances and Increase adequate support systems for patient/family  INTERVENTIONS: Interventions utilized: Solution-Focused Strategies, Supportive Counseling, Psychoeducation and/or Health Education and Link to [employer]  Standardized Assessments completed: GAD-7 and PHQ 2&9 with  C-SSRS  ASSESSMENT: Patient currently experiencing depression and anxiety triggered by ongoing medical conditions that negatively impact mental health. He reports feelings of sadness and worry, difficulty sleeping, irritability, and racing thoughts since the stroke last year. Pt reports strong support from spouse. He scored positive on phq9; however, denies current SI/HI/AVH. Protective factors identified, safety plan discussed, and crisis intervention resources provided.   Patient may benefit from psychoeducation and psychotherapy. LCSWA educated pt on the correlation between one's physical and mental health. Therapeutic interventions were discussed to decrease and/or manage symptoms. Pt was strongly advised to complete financial counseling to assist with medical coverage. Support group resources (Type 2 diabetes and stroke survivors) were provided to strengthen support system.  PLAN: 1. Follow up with behavioral health clinician on : Pt was encouraged to contact LCSWA if symptoms worsen or fail to improve to schedule behavioral appointments at [facility]. 2. Behavioral recommendations: LCSWA recommends that pt apply healthy coping skills discussed and utilize provided resources. Pt is encouraged to schedule follow up appointment with LCSWA 3. Referral(s): [place] (In Clinic) and [facility] Support Groups 4. "From scale of 1-10, how likely are you to follow plan?":   Rebekah Chesterfield, LCSW 11/19/18 5:03 PM

## 2018-11-20 MED FILL — TIMOLOL 0.5% EYE DROPS: 0.5 | 30 days supply | Qty: 5 | Fill #3

## 2018-11-20 MED FILL — LATANOPROST 0.005% EYE DRP: 0.005 | 40 days supply | Qty: 5 | Fill #3

## 2018-11-24 ENCOUNTER — Telehealth: Payer: Self-pay

## 2018-11-24 NOTE — Telephone Encounter (Signed)
-----   Message from Charlott Rakes, MD sent at 11/19/2018  1:32 PM EST ----- Please inform the patient that labs are normal. Thank you.

## 2018-11-24 NOTE — Telephone Encounter (Signed)
Patient was called and informed of lab results. 

## 2018-12-08 ENCOUNTER — Ambulatory Visit: Payer: Self-pay | Attending: Family Medicine

## 2019-01-08 ENCOUNTER — Ambulatory Visit: Payer: Self-pay | Attending: Family Medicine

## 2019-01-19 MED FILL — hydrALAZINE HCL 50 MG TABS: 50 | 30 days supply | Qty: 90 | Fill #3

## 2019-01-19 MED FILL — FLUoxetine HCL 20 MG TABS: 20 | 30 days supply | Qty: 30 | Fill #1

## 2019-01-19 MED FILL — glipiZIDE 5 MG TABS: 5 | 30 days supply | Qty: 60 | Fill #1

## 2019-01-19 MED FILL — EZETIMIBE 10 MG TAB: 10 | 30 days supply | Qty: 30 | Fill #1

## 2019-01-19 MED FILL — FUROSEMIDE 40 MG TAB: 40 | 30 days supply | Qty: 30 | Fill #2

## 2019-01-19 MED FILL — ATORVASTATIN 80 MG TABLET: 80 | 30 days supply | Qty: 30 | Fill #1

## 2019-01-19 MED FILL — CARVEDILOL 12.5 MG TABLET: 12.5 | 30 days supply | Qty: 60 | Fill #1

## 2019-01-20 MED FILL — LATANOPROST 0.005% EYE DRP: 0.005 | 40 days supply | Qty: 5 | Fill #4

## 2019-02-19 ENCOUNTER — Ambulatory Visit: Payer: Self-pay | Admitting: Family Medicine

## 2019-02-19 ENCOUNTER — Encounter: Payer: Self-pay | Admitting: Family Medicine

## 2019-02-19 ENCOUNTER — Ambulatory Visit: Payer: Self-pay | Attending: Family Medicine | Admitting: Family Medicine

## 2019-02-19 VITALS — BP 164/100 | HR 74 | Temp 98.4°F | Ht 69.0 in | Wt 249.0 lb

## 2019-02-19 DIAGNOSIS — I5042 Chronic combined systolic (congestive) and diastolic (congestive) heart failure: Secondary | ICD-10-CM

## 2019-02-19 DIAGNOSIS — E1121 Type 2 diabetes mellitus with diabetic nephropathy: Secondary | ICD-10-CM

## 2019-02-19 DIAGNOSIS — Z0000 Encounter for general adult medical examination without abnormal findings: Secondary | ICD-10-CM

## 2019-02-19 DIAGNOSIS — I10 Essential (primary) hypertension: Secondary | ICD-10-CM

## 2019-02-19 DIAGNOSIS — R4701 Aphasia: Secondary | ICD-10-CM

## 2019-02-19 DIAGNOSIS — E782 Mixed hyperlipidemia: Secondary | ICD-10-CM

## 2019-02-19 DIAGNOSIS — F0631 Mood disorder due to known physiological condition with depressive features: Secondary | ICD-10-CM

## 2019-02-19 LAB — POCT GLYCOSYLATED HEMOGLOBIN (HGB A1C): HbA1c, POC (controlled diabetic range): 6.4 % (ref 0.0–7.0)

## 2019-02-19 LAB — GLUCOSE, POCT (MANUAL RESULT ENTRY): POC Glucose: 155 mg/dl — AB (ref 70–99)

## 2019-02-19 MED FILL — EZETIMIBE 10 MG TAB: 10 | 30 days supply | Qty: 30 | Fill #1

## 2019-02-19 MED FILL — FLUoxetine HCL 20 MG TABS: 20 | 30 days supply | Qty: 30 | Fill #2

## 2019-02-19 MED FILL — ATORVASTATIN 80 MG TABLET: 80 | 30 days supply | Qty: 30 | Fill #2

## 2019-02-19 MED FILL — glipiZIDE 5 MG TABS: 5 | 30 days supply | Qty: 60 | Fill #2

## 2019-02-19 MED FILL — hydrALAZINE HCL 50 MG TABS: 50 | 30 days supply | Qty: 90 | Fill #4

## 2019-02-19 MED FILL — CARVEDILOL 12.5 MG TABLET: 12.5 | 30 days supply | Qty: 60 | Fill #2

## 2019-02-19 NOTE — Progress Notes (Signed)
Subjective:  Patient ID: Matthew Heman., male    DOB: 27-Aug-1966  Age: 53 y.o. MRN: [id number]  CC: Diabetes   HPI Matthew [name]  is a 53 year old male with a history of type 2 diabetes mellitus (A1c 6.4), glaucoma hypertension, sleep apnea on CPAP at night, previous tobacco abuse, systolic and diastolic CHF (EF 93-57% from 01/2017), history of bilateral ACA infarct in 01/2017 with slight left hemiparesis and dysarthria here  for a follow-up visit.  He is requesting a letter stating he is able to work 40 hours so he can secure this new job.  He will be working at [employer] which he has done part-time.  He states he will be able to cope with this job. Hoping to receive health insurance through this new job and the prospect of working full-time again has him excited and has led to improvement in his depressive symptoms which he had complained about at his last job.  He had been depressed because his wife had been the breadwinner ever since he had his stroke.  His blood pressure is elevated and he endorses running out of some of his antihypertensives. His diabetes is controlled and he denies hypoglycemia, numbness in extremities, visual concerns. He has no additional concerns today. Past Medical History:  Diagnosis Date  . Acute combined systolic and diastolic CHF, NYHA class 3 ([place])    a. 02/2014 Echo: EF 25-30%.  . Cardiomyopathy ([place])    a. 02/2014 Echo: EF 25-30%, sev glob HK with inferolat HK->AK, mod conc LVH, Gr 2 DD, Mild MR, sev dil LA.  . Depression   . DM (diabetes mellitus) ([place])   . High cholesterol   . Hypertension   . Morbid obesity ([place])   . Tobacco abuse     Past Surgical History:  Procedure Laterality Date  . CATARACT EXTRACTION Right   . LEFT HEART CATHETERIZATION WITH CORONARY ANGIOGRAM N/A 03/04/2014   Procedure: LEFT HEART CATHETERIZATION WITH CORONARY ANGIOGRAM;  Surgeon: Burnell Blanks, MD;  Location: [facility] CATH LAB;  Service:  Cardiovascular;  Laterality: N/A;  . TEE WITHOUT CARDIOVERSION N/A 02/04/2017   Procedure: TRANSESOPHAGEAL ECHOCARDIOGRAM (TEE);  Surgeon: Dorothy Spark, MD;  Location: [facility] ENDOSCOPY;  Service: Cardiovascular;  Laterality: N/A;    Family History  Problem Relation Age of Onset  . Heart attack Father        died @ 89  . Cervical cancer Mother        died @ 79    No Known Allergies  Outpatient Medications Prior to Visit  Medication Sig Dispense Refill  . atorvastatin (LIPITOR) 80 MG tablet Take 1 tablet (80 mg total) by mouth daily. 30 tablet 6  . carvedilol (COREG) 12.5 MG tablet Take 1 tablet (12.5 mg total) by mouth 2 (two) times daily with a meal. 60 tablet 6  . ezetimibe (ZETIA) 10 MG tablet Take 1 tablet (10 mg total) by mouth daily. 30 tablet 1  . FLUoxetine (PROZAC) 20 MG tablet Take 1 tablet (20 mg total) by mouth daily. 30 tablet 6  . furosemide (LASIX) 40 MG tablet Take 1 tablet (40 mg total) by mouth daily. 30 tablet 6  . glipiZIDE (GLUCOTROL) 5 MG tablet Take 1 tablet (5 mg total) by mouth 2 (two) times daily before a meal. 60 tablet 6  . hydrALAZINE (APRESOLINE) 50 MG tablet Take 1 tablet (50 mg total) by mouth 3 (three) times daily. 90 tablet 6  . lisinopril (PRINIVIL,ZESTRIL) 5  MG tablet Take 1 tablet (5 mg total) by mouth daily. 30 tablet 6  . acetaminophen (TYLENOL) 500 MG tablet Take 500 mg by mouth every 6 (six) hours as needed for mild pain.    Marland Kitchen aspirin 81 MG tablet Take 1 tablet (81 mg total) by mouth daily. (Patient not taking: Reported on 11/18/2018) 30 tablet 6  . glucose blood (ONE TOUCH ULTRA TEST) test strip Use as instructed (Patient not taking: Reported on 02/19/2019) 30 each 12  . ibuprofen (ADVIL,MOTRIN) 800 MG tablet Take 1 tablet (800 mg total) by mouth every 6 (six) hours as needed. (Patient not taking: Reported on 02/19/2019) 30 tablet 0   No facility-administered medications prior to visit.      ROS Review of Systems  Constitutional: Negative for  activity change and appetite change.  HENT: Negative for sinus pressure and sore throat.   Eyes: Negative for visual disturbance.  Respiratory: Negative for cough, chest tightness and shortness of breath.   Cardiovascular: Negative for chest pain and leg swelling.  Gastrointestinal: Negative for abdominal distention, abdominal pain, constipation and diarrhea.  Endocrine: Negative.   Genitourinary: Negative for dysuria.  Musculoskeletal: Negative for joint swelling and myalgias.  Skin: Negative for rash.  Allergic/Immunologic: Negative.   Neurological: Positive for speech difficulty (slight). Negative for weakness, light-headedness and numbness.  Psychiatric/Behavioral: Negative for dysphoric mood and suicidal ideas.    Objective:  BP (!) 164/100   Pulse 74   Temp 98.4 F (36.9 C) (Oral)   Ht 5\' 9"  (1.753 m)   Wt 249 lb (112.9 kg)   SpO2 97%   BMI 36.77 kg/m   BP/Weight 02/19/2019 11/18/2018 06/06/3085  Systolic BP 578 469 629  Diastolic BP 528 80 413  Wt. (Lbs) 249 - -  BMI 36.77 38.01 -      Physical Exam Constitutional:      Appearance: He is well-developed.  Cardiovascular:     Rate and Rhythm: Normal rate.     Heart sounds: Normal heart sounds. No murmur.  Pulmonary:     Effort: Pulmonary effort is normal.     Breath sounds: Normal breath sounds. No wheezing or rales.  Chest:     Chest wall: No tenderness.  Abdominal:     General: Bowel sounds are normal. There is no distension.     Palpations: Abdomen is soft. There is no mass.     Tenderness: There is no abdominal tenderness.  Musculoskeletal: Normal range of motion.  Neurological:     Mental Status: He is alert and oriented to person, place, and time.     Motor: No weakness.     Coordination: Coordination normal.     Gait: Gait normal.  Psychiatric:        Mood and Affect: Mood normal.     CMP Latest Ref Rng & Units 11/18/2018 03/07/2018 12/03/2017  Glucose 65 - 99 mg/dL 183(H) 144(H) 99  BUN 6 - 24  mg/dL 11 9 11   Creatinine 0.76 - 1.27 mg/dL 1.19 1.34(H) 1.14  Sodium 134 - 144 mmol/L 138 142 140  Potassium 3.5 - 5.2 mmol/L 3.9 4.0 4.1  Chloride 96 - 106 mmol/L 98 102 105  CO2 20 - 29 mmol/L 23 23 21   Calcium 8.7 - 10.2 mg/dL 9.5 8.9 9.2  Total Protein 6.0 - 8.5 g/dL - 6.7 7.1  Total Bilirubin 0.0 - 1.2 mg/dL - 0.5 0.2  Alkaline Phos 39 - 117 IU/L - 73 79  AST 0 - 40 IU/L -  16 20  ALT 0 - 44 IU/L - 21 29    Lipid Panel     Component Value Date/Time   CHOL 144 03/07/2018 0834   TRIG 175 (H) 03/07/2018 0834   HDL 36 (L) 03/07/2018 0834   CHOLHDL 4.0 03/07/2018 0834   CHOLHDL 5.3 02/01/2017 0409   VLDL UNABLE TO CALCULATE IF TRIGLYCERIDE OVER 400 mg/dL 02/01/2017 0409   LDLCALC 73 03/07/2018 0834    CBC    Component Value Date/Time   WBC 5.0 01/31/2017 0918   RBC 5.32 01/31/2017 0918   HGB 16.3 01/31/2017 0935   HCT 48.0 01/31/2017 0935   PLT 249 01/31/2017 0918   MCV 87.2 01/31/2017 0918   MCH 29.7 01/31/2017 0918   MCHC 34.1 01/31/2017 0918   RDW 13.7 01/31/2017 0918   LYMPHSABS 1.9 01/31/2017 0918   MONOABS 0.5 01/31/2017 0918   EOSABS 0.1 01/31/2017 0918   BASOSABS 0.0 01/31/2017 0918    Lab Results  Component Value Date   HGBA1C 6.4 02/19/2019    Assessment & Plan:   1. Controlled type 2 diabetes mellitus with diabetic nephropathy, without long-term current use of insulin (HCC) Controlled with A1c of 6.4 Continue current management Counseled on Diabetic diet, my plate method, 607 minutes of moderate intensity exercise/week Keep blood sugar logs with fasting goals of 80-120 mg/dl, random of less than 180 and in the event of sugars less than 60 mg/dl or greater than 400 mg/dl please notify the clinic ASAP. It is recommended that you undergo annual eye exams and annual foot exams. Pneumonia vaccine is recommended. - POCT glucose (manual entry) - POCT glycosylated hemoglobin (Hb A1C) - Microalbumin/Creatinine Ratio, Urine; Future  2. Chronic combined  systolic and diastolic congestive heart failure (HCC) EF 45 to 50% from echo of 01/2017 Euvolemic Continue beta-blocker, ACE inhibitor, Lasix  3. Depression due to physical illness Improved ever since he got a new job Continue Prozac  4. 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 Mixed hyperlipidemia Controlled Low-cholesterol diet - Lipid panel; Future  6. Healthcare maintenance He needs a colonoscopy however will need his insurance to kick in prior to referral  7.  Hypertension Uncontrolled He has run out of some of his medications Counseled on blood pressure goal of less than 130/80, low-sodium, DASH diet, medication compliance, 150 minutes of moderate intensity exercise per week. Discussed medication compliance, adverse effects. No orders of the defined types were placed in this encounter.   Follow-up: Return in about 3 months (around 05/22/2019) for follow up of chronic medical conditions.       Charlott Rakes, MD, FAAFP. [facility], [place]   02/19/2019, 8:49 AM

## 2019-02-19 NOTE — Patient Instructions (Signed)
Colonoscopy, Adult A colonoscopy is an exam to look at the entire large intestine. During the exam, a lubricated, flexible tube that has a camera on the end of it is inserted into the anus and then passed into the rectum, colon, and other parts of the large intestine. You may have a colonoscopy as a part of normal colorectal screening or if you have certain symptoms, such as:  Lack of red blood cells (anemia).  Diarrhea that does not go away.  Abdominal pain.  Blood in your stool (feces). A colonoscopy can help screen for and diagnose medical problems, including:  Tumors.  Polyps.  Inflammation.  Areas of bleeding. Tell a health care provider about:  Any allergies you have.  All medicines you are taking, including vitamins, herbs, eye drops, creams, and over-the-counter medicines.  Any problems you or family members have had with anesthetic medicines.  Any blood disorders you have.  Any surgeries you have had.  Any medical conditions you have.  Any problems you have had passing stool. What are the risks? Generally, this is a safe procedure. However, problems may occur, including:  Bleeding.  A tear in the intestine.  A reaction to medicines given during the exam.  Infection (rare). What happens before the procedure? Eating and drinking restrictions Follow instructions from your health care provider about eating and drinking, which may include:  A few days before the procedure - follow a low-fiber diet. Avoid nuts, seeds, dried fruit, raw fruits, and vegetables.  1-3 days before the procedure - follow a clear liquid diet. Drink only clear liquids, such as clear broth or bouillon, black coffee or tea, clear juice, clear soft drinks or sports drinks, gelatin dessert, and popsicles. Avoid any liquids that contain red or purple dye.  On the day of the procedure - do not eat or drink anything starting 2 hours before the procedure, or within the time period that your  health care provider recommends. Up to 2 hours before the procedure, you may continue to drink clear liquids, such as water or clear fruit juice. Bowel prep If you were prescribed an oral bowel prep to clean out your colon:  Take it as told by your health care provider. Starting the day before your procedure, you will need to drink a large amount of medicated liquid. The liquid will cause you to have multiple loose stools until your stool is almost clear or light green.  If your skin or anus gets irritated from diarrhea, you may use these to relieve the irritation: ? Medicated wipes, such as adult wet wipes with aloe and vitamin E. ? A skin-soothing product like petroleum jelly.  If you vomit while drinking the bowel prep, take a break for up to 60 minutes and then begin the bowel prep again. If vomiting continues and you cannot take the bowel prep without vomiting, call your health care provider.  To clean out your colon, you may also be given: ? Laxative medicines. ? Instructions about how to use an enema. General instructions  Ask your health care provider about: ? Changing or stopping your regular medicines or supplements. This is especially important if you are taking iron supplements, diabetes medicines, or blood thinners. ? Taking medicines such as aspirin and ibuprofen. These medicines can thin your blood. Do not take these medicines before the procedure if your health care provider tells you not to.  Plan to have someone take you home from the hospital or clinic. What happens during the procedure?     An IV may be inserted into one of your veins.  You will be given medicine to help you relax (sedative).  To reduce your risk of infection: ? Your health care team will wash or sanitize their hands. ? Your anal area will be washed with soap.  You will be asked to lie on your side with your knees bent.  Your health care provider will lubricate a long, thin, flexible tube. The  tube will have a camera and a light on the end.  The tube will be inserted into your anus.  The tube will be gently eased through your rectum and colon.  Air will be delivered into your colon to keep it open. You may feel some pressure or cramping.  The camera will be used to take images during the procedure.  A small tissue sample may be removed to be examined under a microscope (biopsy).  If small polyps are found, your health care provider may remove them and have them checked for cancer cells.  When the exam is done, the tube will be removed. The procedure may vary among health care providers and hospitals. What happens after the procedure?  Your blood pressure, heart rate, breathing rate, and blood oxygen level will be monitored until the medicines you were given have worn off.  Do not drive for 24 hours after the exam.  You may have a small amount of blood in your stool.  You may pass gas and have mild abdominal cramping or bloating due to the air that was used to inflate your colon during the exam.  It is up to you to get the results of your procedure. Ask your health care provider, or the department performing the procedure, when your results will be ready. Summary  A colonoscopy is an exam to look at the entire large intestine.  During a colonoscopy, a lubricated, flexible tube with a camera on the end of it is inserted into the anus and then passed into the colon and other parts of the large intestine.  Follow instructions from your health care provider about eating and drinking before the procedure.  If you were prescribed an oral bowel prep to clean out your colon, take it as told by your health care provider.  After your procedure, your blood pressure, heart rate, breathing rate, and blood oxygen level will be monitored until the medicines you were given have worn off. This information is not intended to replace advice given to you by your health care provider. Make  sure you discuss any questions you have with your health care provider. Document Released: 11/30/2000 Document Revised: 09/25/2017 Document Reviewed: 02/14/2016 Elsevier Interactive Patient Education  2019 [employer]vier Inc.  

## 2019-02-19 NOTE — Progress Notes (Signed)
C/C: HYPERTENSION  REFILLS: none.

## 2019-04-23 ENCOUNTER — Other Ambulatory Visit: Payer: Self-pay | Admitting: Family Medicine

## 2019-04-23 DIAGNOSIS — I5042 Chronic combined systolic (congestive) and diastolic (congestive) heart failure: Secondary | ICD-10-CM

## 2019-04-23 DIAGNOSIS — E782 Mixed hyperlipidemia: Secondary | ICD-10-CM

## 2019-04-23 MED FILL — FUROSEMIDE 40 MG TAB: 40 | 30 days supply | Qty: 30 | Fill #1

## 2019-04-23 MED FILL — CARVEDILOL 12.5 MG TABLET: 12.5 | 30 days supply | Qty: 60 | Fill #3

## 2019-04-23 MED FILL — FLUoxetine HCL 20 MG TABS: 20 | 30 days supply | Qty: 30 | Fill #3

## 2019-04-23 MED FILL — LATANOPROST 0.005% EYE DRP: 0.005 | 30 days supply | Qty: 3 | Fill #0

## 2019-04-23 MED FILL — hydrALAZINE HCL 50 MG TABS: 50 | 30 days supply | Qty: 90 | Fill #5

## 2019-04-23 MED FILL — LISINOPRIL 5 MG TAB: 5 | 30 days supply | Qty: 30 | Fill #1

## 2019-04-23 MED FILL — glipiZIDE 5 MG TABS: 5 | 30 days supply | Qty: 60 | Fill #3

## 2019-04-23 MED FILL — ATORVASTATIN 80 MG TABLET: 80 | 30 days supply | Qty: 30 | Fill #3

## 2019-05-27 ENCOUNTER — Other Ambulatory Visit: Payer: Self-pay

## 2019-05-27 ENCOUNTER — Ambulatory Visit: Payer: Self-pay | Attending: Family Medicine | Admitting: Family Medicine

## 2019-06-09 MED FILL — LATANOPROST 0.005% EYE DRP: 0.005 | 30 days supply | Qty: 3 | Fill #0

## 2019-06-10 MED FILL — TIMOLOL 0.5% EYE DROPS: 0.5 | 60 days supply | Qty: 10 | Fill #0

## 2019-06-12 MED FILL — LISINOPRIL 5 MG TAB: 5 | 30 days supply | Qty: 30 | Fill #2

## 2019-06-12 MED FILL — FLUoxetine HCL 20 MG TABS: 20 | 30 days supply | Qty: 30 | Fill #4

## 2019-06-12 MED FILL — ?GLIPIZIDE 5MG TABLET: 5 | 30 days supply | Qty: 60 | Fill #4

## 2019-06-12 MED FILL — CARVEDILOL 12.5 MG TABLET: 12.5 | 30 days supply | Qty: 60 | Fill #4

## 2019-07-22 MED FILL — ?GLIPIZIDE 5MG TABLET: 5 | 30 days supply | Qty: 60 | Fill #5

## 2019-07-22 MED FILL — EZETIMIBE 10 MG TAB: 10 | 30 days supply | Qty: 30 | Fill #0

## 2019-07-22 MED FILL — LATANOPROST 0.005% EYE DRP: 0.005 | 18 days supply | Qty: 3 | Fill #1

## 2019-07-22 MED FILL — ATORVASTATIN 80 MG TABLET: 80 | 30 days supply | Qty: 30 | Fill #4

## 2019-07-22 MED FILL — FLUoxetine HCL 20 MG TABS: 20 | 30 days supply | Qty: 30 | Fill #5

## 2019-08-11 MED FILL — TIMOLOL 0.5% EYE DROPS: 0.5 | 37 days supply | Qty: 10 | Fill #0

## 2019-08-11 MED FILL — LATANOPROST 0.005% EYE DRP: 0.005 | 18 days supply | Qty: 3 | Fill #2

## 2019-08-11 MED FILL — CARVEDILOL 12.5 MG TABLET: 12.5 | 30 days supply | Qty: 60 | Fill #5

## 2019-09-25 MED FILL — FLUoxetine HCL 20 MG TABS: 20 | 30 days supply | Qty: 30 | Fill #6

## 2019-09-25 MED FILL — LATANOPROST 0.005% EYE DRP: 0.005 | 18 days supply | Qty: 3 | Fill #3

## 2019-09-25 MED FILL — TIMOLOL 0.5% EYE DROPS: 0.5 | 37 days supply | Qty: 10 | Fill #1

## 2019-09-25 MED FILL — LISINOPRIL 5 MG TABLET: 5 | 30 days supply | Qty: 30 | Fill #3

## 2019-09-25 MED FILL — CARVEDILOL 12.5 MG TABLET: 12.5 | 30 days supply | Qty: 60 | Fill #6

## 2019-09-25 MED FILL — glipiZIDE 5 MG TABS: 5 | 30 days supply | Qty: 60 | Fill #6

## 2019-09-25 MED FILL — ATORVASTATIN 80 MG TABLET: 80 | 30 days supply | Qty: 30 | Fill #5

## 2019-09-25 MED FILL — EZETIMIBE 10 MG TAB: 10 | 30 days supply | Qty: 30 | Fill #1

## 2019-11-06 ENCOUNTER — Other Ambulatory Visit: Payer: Self-pay | Admitting: Family Medicine

## 2019-11-06 DIAGNOSIS — I10 Essential (primary) hypertension: Secondary | ICD-10-CM

## 2019-11-06 DIAGNOSIS — E1121 Type 2 diabetes mellitus with diabetic nephropathy: Secondary | ICD-10-CM

## 2019-11-06 MED FILL — glipiZIDE 5 MG TABS: 5 | 30 days supply | Qty: 60 | Fill #0

## 2019-11-06 MED FILL — LATANOPROST 0.005% EYE DRP: 0.005 | 18 days supply | Qty: 3 | Fill #4

## 2019-11-06 MED FILL — CARVEDILOL 12.5 MG TABLET: 12.5 | 30 days supply | Qty: 60 | Fill #0

## 2019-11-09 MED FILL — TIMOLOL 0.5% EYE DROPS: 0.5 | 37 days supply | Qty: 10 | Fill #0

## 2019-12-02 ENCOUNTER — Other Ambulatory Visit: Payer: Self-pay

## 2019-12-02 ENCOUNTER — Ambulatory Visit: Payer: Self-pay | Attending: Family Medicine | Admitting: Family Medicine

## 2019-12-02 ENCOUNTER — Other Ambulatory Visit: Payer: Self-pay | Admitting: Family Medicine

## 2019-12-02 ENCOUNTER — Encounter: Payer: Self-pay | Admitting: Family Medicine

## 2019-12-02 VITALS — BP 164/100 | HR 67 | Temp 98.3°F | Ht 69.0 in | Wt 253.0 lb

## 2019-12-02 DIAGNOSIS — I10 Essential (primary) hypertension: Secondary | ICD-10-CM

## 2019-12-02 DIAGNOSIS — F0631 Mood disorder due to known physiological condition with depressive features: Secondary | ICD-10-CM

## 2019-12-02 DIAGNOSIS — E782 Mixed hyperlipidemia: Secondary | ICD-10-CM

## 2019-12-02 DIAGNOSIS — I5042 Chronic combined systolic (congestive) and diastolic (congestive) heart failure: Secondary | ICD-10-CM

## 2019-12-02 DIAGNOSIS — E1121 Type 2 diabetes mellitus with diabetic nephropathy: Secondary | ICD-10-CM

## 2019-12-02 LAB — GLUCOSE, POCT (MANUAL RESULT ENTRY): POC Glucose: 86 mg/dl (ref 70–99)

## 2019-12-02 LAB — POCT GLYCOSYLATED HEMOGLOBIN (HGB A1C): HbA1c, POC (controlled diabetic range): 6.4 % (ref 0.0–7.0)

## 2019-12-02 MED ORDER — GLIPIZIDE 5 MG PO TABS: 5.0000 mg | ORAL_TABLET | Freq: Two times a day (BID) | ORAL | 6 refills | Status: DC

## 2019-12-02 MED ORDER — EZETIMIBE 10 MG PO TABS: 10.0000 mg | ORAL_TABLET | Freq: Every day | ORAL | 6 refills | Status: DC

## 2019-12-02 MED ORDER — FLUOXETINE HCL 20 MG PO TABS: 20.0000 mg | ORAL_TABLET | Freq: Every day | ORAL | 6 refills | Status: DC

## 2019-12-02 MED ORDER — LISINOPRIL 5 MG PO TABS: 5.0000 mg | ORAL_TABLET | Freq: Every day | ORAL | 6 refills | Status: DC

## 2019-12-02 MED ORDER — HYDRALAZINE HCL 50 MG PO TABS: 50.0000 mg | ORAL_TABLET | Freq: Three times a day (TID) | ORAL | 6 refills | Status: DC

## 2019-12-02 MED ORDER — ATORVASTATIN CALCIUM 80 MG PO TABS: 80.0000 mg | ORAL_TABLET | Freq: Every day | ORAL | 6 refills | Status: DC

## 2019-12-02 MED ORDER — CARVEDILOL 12.5 MG PO TABS: 12.5000 mg | ORAL_TABLET | Freq: Two times a day (BID) | ORAL | 6 refills | Status: DC

## 2019-12-02 MED ORDER — FUROSEMIDE 40 MG PO TABS: 40.0000 mg | ORAL_TABLET | Freq: Every day | ORAL | 6 refills | Status: DC

## 2019-12-02 MED FILL — hydrALAZINE HCL 50 MG TABS: 50 | 30 days supply | Qty: 90 | Fill #0

## 2019-12-02 MED FILL — EZETIMIBE 10 MG TAB: 10 | 30 days supply | Qty: 30 | Fill #0

## 2019-12-02 MED FILL — LISINOPRIL 5 MG TABLET: 5 | 30 days supply | Qty: 30 | Fill #0

## 2019-12-02 MED FILL — FLUoxetine HCL 20 MG TABS: 20 | 30 days supply | Qty: 30 | Fill #0

## 2019-12-02 MED FILL — ATORVASTATIN 80 MG TABLET: 80 | 30 days supply | Qty: 30 | Fill #0

## 2019-12-02 MED FILL — FUROSEMIDE 40 MG TAB: 40 | 30 days supply | Qty: 30 | Fill #0

## 2019-12-02 MED FILL — glipiZIDE 5 MG TABS: 5 | 30 days supply | Qty: 60 | Fill #0

## 2019-12-02 MED FILL — CARVEDILOL 12.5 MG TABLET: 12.5 | 30 days supply | Qty: 60 | Fill #0

## 2019-12-02 NOTE — Progress Notes (Signed)
Subjective:  Patient ID: Matthew Heman., male    DOB: [birth year]  Age: 53 y.o. MRN: [id number]  CC: Diabetes and Hypertension   HPI Matthew Estabrook. is a 53 year old male with a history of type 2 diabetes mellitus (A1c 6.4), glaucoma hypertension, sleep apnea on CPAP at night, previous tobacco abuse, systolic and diastolic CHF (EF [id number] from 01/2017), history of bilateral ACA infarct in 01/2017 with slight left hemiparesis and dysarthria here  for a follow-up visit.  His left hemiparesis and dysarthria have improved significantly ever since he returned to work and he has not noticed any weakness in his extremities and tries to speak slowly to prevent himself from stuttering.  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  Denies falls.  His diabetes has been controlled with no hypoglycemic episodes.  He denies visual concerns or numbness in extremities. He has been out of some of his antihypertensives hence his elevated blood pressure.   Past Medical History:  Diagnosis Date  . Acute combined systolic and diastolic CHF, NYHA class 3 ([place])    a. 02/2014 Echo: EF 25-30%.  . Cardiomyopathy ([place])    a. 02/2014 Echo: EF 25-30%, sev glob HK with inferolat HK->AK, mod conc LVH, Gr 2 DD, Mild MR, sev dil LA.  . Depression   . DM (diabetes mellitus) ([place])   . High cholesterol   . Hypertension   . Morbid obesity ([place])   . Tobacco abuse     Past Surgical History:  Procedure Laterality Date  . CATARACT EXTRACTION Right   . LEFT HEART CATHETERIZATION WITH CORONARY ANGIOGRAM N/A 03/04/2014   Procedure: LEFT HEART CATHETERIZATION WITH CORONARY ANGIOGRAM;  Surgeon: Burnell Blanks, MD;  Location: [facility] CATH LAB;  Service: Cardiovascular;  Laterality: N/A;  . TEE WITHOUT CARDIOVERSION N/A 02/04/2017   Procedure: TRANSESOPHAGEAL ECHOCARDIOGRAM (TEE);  Surgeon: Dorothy Spark, MD;  Location: [facility] ENDOSCOPY;  Service: Cardiovascular;  Laterality: N/A;    Family History    Problem Relation Age of Onset  . Heart attack Father        died @ 66  . Cervical cancer Mother        died @ 83    No Known Allergies  Outpatient Medications Prior to Visit  Medication Sig Dispense Refill  . atorvastatin (LIPITOR) 80 MG tablet Take 1 tablet (80 mg total) by mouth daily. 30 tablet 6  . carvedilol (COREG) 12.5 MG tablet Take 1 tablet (12.5 mg total) by mouth 2 (two) times daily with a meal. Must have office visit for refills 60 tablet 0  . ezetimibe (ZETIA) 10 MG tablet TAKE 1 TABLET BY MOUTH DAILY. 30 tablet 2  . FLUoxetine (PROZAC) 20 MG tablet Take 1 tablet (20 mg total) by mouth daily. 30 tablet 6  . furosemide (LASIX) 40 MG tablet TAKE 1 TABLET BY MOUTH DAILY. 30 tablet 2  . glipiZIDE (GLUCOTROL) 5 MG tablet Take 1 tablet (5 mg total) by mouth 2 (two) times daily before a meal. Must have office visit for refills 60 tablet 0  . hydrALAZINE (APRESOLINE) 50 MG tablet Take 1 tablet (50 mg total) by mouth 3 (three) times daily. 90 tablet 6  . lisinopril (PRINIVIL,ZESTRIL) 5 MG tablet Take 1 tablet (5 mg total) by mouth daily. 30 tablet 6  . acetaminophen (TYLENOL) 500 MG tablet Take 500 mg by mouth every 6 (six) hours as needed for mild pain.    Marland Kitchen aspirin 81 MG tablet Take  1 tablet (81 mg total) by mouth daily. (Patient not taking: Reported on 11/18/2018) 30 tablet 6  . glucose blood (ONE TOUCH ULTRA TEST) test strip Use as instructed (Patient not taking: Reported on 02/19/2019) 30 each 12  . ibuprofen (ADVIL,MOTRIN) 800 MG tablet Take 1 tablet (800 mg total) by mouth every 6 (six) hours as needed. (Patient not taking: Reported on 02/19/2019) 30 tablet 0   No facility-administered medications prior to visit.     ROS Review of Systems  Constitutional: Negative for activity change and appetite change.  HENT: Negative for sinus pressure and sore throat.   Eyes: Negative for visual disturbance.  Respiratory: Negative for cough, chest tightness and shortness of breath.    Cardiovascular: Negative for chest pain and leg swelling.  Gastrointestinal: Negative for abdominal distention, abdominal pain, constipation and diarrhea.  Endocrine: Negative.   Genitourinary: Negative for dysuria.  Musculoskeletal: Negative for joint swelling and myalgias.  Skin: Negative for rash.  Allergic/Immunologic: Negative.   Neurological: Negative for weakness, light-headedness and numbness.  Psychiatric/Behavioral: Negative for dysphoric mood and suicidal ideas.    Objective:  BP (!) 164/100   Pulse 67   Temp 98.3 F (36.8 C) (Oral)   Ht 5\' 9"  (1.753 m)   Wt 253 lb (114.8 kg)   SpO2 97%   BMI 37.36 kg/m   BP/Weight 12/02/2019 02/19/2019 [id number]  Systolic BP 123456 123456 123456  Diastolic BP 123XX123 123XX123 80  Wt. (Lbs) 253 249 -  BMI 37.36 36.77 38.01      Physical Exam Constitutional:      Appearance: He is well-developed.  HENT:     Head: Normocephalic and atraumatic.     Right Ear: External ear normal.     Left Ear: External ear normal.  Eyes:     Conjunctiva/sclera: Conjunctivae normal.     Pupils: Pupils are equal, round, and reactive to light.  Neck:     Trachea: No tracheal deviation.  Cardiovascular:     Rate and Rhythm: Normal rate and regular rhythm.     Heart sounds: Normal heart sounds. No murmur.  Pulmonary:     Effort: Pulmonary effort is normal. No respiratory distress.     Breath sounds: Normal breath sounds. No wheezing.  Chest:     Chest wall: No tenderness.  Abdominal:     General: Bowel sounds are normal.     Palpations: Abdomen is soft. There is no mass.     Tenderness: There is no abdominal tenderness.  Musculoskeletal:        General: No tenderness. Normal range of motion.     Cervical back: Normal range of motion and neck supple.  Skin:    General: Skin is warm and dry.  Neurological:     Mental Status: He is alert and oriented to person, place, and time.  Psychiatric:        Mood and Affect: Mood normal.     CMP Latest Ref Rng  & Units 11/18/2018 03/07/2018 12/03/2017  Glucose 65 - 99 mg/dL 183(H) 144(H) 99  BUN 6 - 24 mg/dL 11 9 11   Creatinine 0.76 - 1.27 mg/dL 1.19 1.34(H) 1.14  Sodium 134 - 144 mmol/L 138 142 140  Potassium 3.5 - 5.2 mmol/L 3.9 4.0 4.1  Chloride 96 - 106 mmol/L 98 102 105  CO2 20 - 29 mmol/L 23 23 21   Calcium 8.7 - 10.2 mg/dL 9.5 8.9 9.2  Total Protein 6.0 - 8.5 g/dL - 6.7 7.1  Total Bilirubin 0.0 -  1.2 mg/dL - 0.5 0.2  Alkaline Phos 39 - 117 IU/L - 73 79  AST 0 - 40 IU/L - 16 20  ALT 0 - 44 IU/L - 21 29    Lipid Panel     Component Value Date/Time   CHOL 144 03/07/2018 0834   TRIG 175 (H) 03/07/2018 0834   HDL 36 (L) 03/07/2018 0834   CHOLHDL 4.0 03/07/2018 0834   CHOLHDL 5.3 02/01/2017 0409   VLDL UNABLE TO CALCULATE IF TRIGLYCERIDE OVER 400 mg/dL 02/01/2017 0409   LDLCALC 73 03/07/2018 0834    CBC    Component Value Date/Time   WBC 5.0 01/31/2017 0918   RBC 5.32 01/31/2017 0918   HGB 16.3 01/31/2017 0935   HCT 48.0 01/31/2017 0935   PLT 249 01/31/2017 0918   MCV 87.2 01/31/2017 0918   MCH 29.7 01/31/2017 0918   MCHC 34.1 01/31/2017 0918   RDW 13.7 01/31/2017 0918   LYMPHSABS 1.9 01/31/2017 0918   MONOABS 0.5 01/31/2017 0918   EOSABS 0.1 01/31/2017 0918   BASOSABS 0.0 01/31/2017 0918    Lab Results  Component Value Date   HGBA1C 6.4 12/02/2019    Assessment & Plan:   1. Controlled type 2 diabetes mellitus with diabetic nephropathy, without long-term current use of insulin (HCC) Controlled with A1c of  6.4 Continue current regimen Counseled on Diabetic diet, my plate method, [id number] minutes of moderate intensity exercise/week Blood sugar logs with fasting goals of 80-120 mg/dl, random of less than 180 and in the event of sugars less than 60 mg/dl or greater than 400 mg/dl encouraged to notify the clinic. Advised on the need for annual eye exams, annual foot exams, Pneumonia vaccine. - Glucose (CBG) - HgB A1c - Microalbumin/Creatinine Ratio, Urine - Basic Metabolic  Panel - glipiZIDE (GLUCOTROL) 5 MG tablet; Take 1 tablet (5 mg total) by mouth 2 (two) times daily before a meal. Must have office visit for refills  Dispense: 60 tablet; Refill: 6  2. Mixed hyperlipidemia Controlled Low-cholesterol diet - atorvastatin (LIPITOR) 80 MG tablet; Take 1 tablet (80 mg total) by mouth daily.  Dispense: 30 tablet; Refill: 6 - ezetimibe (ZETIA) 10 MG tablet; Take 1 tablet (10 mg total) by mouth daily.  Dispense: 30 tablet; Refill: 6  3. Essential hypertension Uncontrolled due to running out of medications Medications have been refilled Counseled on blood pressure goal of less than 130/80, low-sodium, DASH diet, medication compliance, 150 minutes of moderate intensity exercise per week. Discussed medication compliance, adverse effects. - carvedilol (COREG) 12.5 MG tablet; Take 1 tablet (12.5 mg total) by mouth 2 (two) times daily with a meal. Must have office visit for refills  Dispense: 60 tablet; Refill: 6 - hydrALAZINE (APRESOLINE) 50 MG tablet; Take 1 tablet (50 mg total) by mouth 3 (three) times daily.  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  Dispense: 30 tablet; Refill: 6  5. Chronic combined systolic and diastolic congestive heart failure (HCC) EF of 45-50% from echo of 01/2019 Euvolemic Will need repeat echo at next visit to follow up on cardiac function Continue betablocker, Hydralazine, Furosemide - furosemide (LASIX) 40 MG tablet; Take 1 tablet (40 mg total) by mouth daily.  Dispense: 30 tablet; Refill: 6   Health Care Maintenance: due for colonoscopy; if unable to obtain medical insurance consider FIT test  No orders of the defined types were placed in  this encounter.    Return in about 6 months (around 06/01/2020) for Chronic medical conditions.       Charlott Rakes, MD, FAAFP. [facility] [place], [place]   12/02/2019, 3:52 PM

## 2019-12-03 LAB — BASIC METABOLIC PANEL
BUN/Creatinine Ratio: 13 (ref 9–20)
BUN: 15 mg/dL (ref 6–24)
CO2: 22 mmol/L (ref 20–29)
Calcium: 8.9 mg/dL (ref 8.7–10.2)
Chloride: 107 mmol/L — ABNORMAL HIGH (ref 96–106)
Creatinine, Ser: 1.2 mg/dL (ref 0.76–1.27)
GFR calc Af Amer: 80 mL/min/{1.73_m2} (ref >59)
GFR calc non Af Amer: 69 mL/min/{1.73_m2} (ref >59)
Glucose: 87 mg/dL (ref 65–99)
Potassium: 4.3 mmol/L (ref 3.5–5.2)
Sodium: 141 mmol/L (ref 134–144)

## 2019-12-03 LAB — MICROALBUMIN / CREATININE URINE RATIO
Creatinine, Urine: 282.2 mg/dL
Microalb/Creat Ratio: 318 mg/g creat — ABNORMAL HIGH (ref 0–29)
Microalbumin, Urine: 897.2 ug/mL

## 2020-02-11 MED FILL — TIMOLOL 0.5% EYE DROPS: 0.5 | 37 days supply | Qty: 10 | Fill #1

## 2020-02-11 MED FILL — glipiZIDE 5 MG TABS: 5 | 30 days supply | Qty: 60 | Fill #1

## 2020-02-11 MED FILL — CARVEDILOL 12.5 MG TABLET: 12.5 | 30 days supply | Qty: 60 | Fill #1

## 2020-02-11 MED FILL — LATANOPROST 0.005% EYE DRP: 0.005 | 18 days supply | Qty: 3 | Fill #5

## 2020-02-15 ENCOUNTER — Inpatient Hospital Stay (HOSPITAL_COMMUNITY): Payer: Self-pay

## 2020-02-15 ENCOUNTER — Other Ambulatory Visit: Payer: Self-pay

## 2020-02-15 ENCOUNTER — Encounter (HOSPITAL_COMMUNITY): Payer: Self-pay | Admitting: Emergency Medicine

## 2020-02-15 ENCOUNTER — Emergency Department (HOSPITAL_COMMUNITY): Payer: Self-pay

## 2020-02-15 ENCOUNTER — Inpatient Hospital Stay (HOSPITAL_COMMUNITY)
Admission: EM | Admit: 2020-02-15 | Discharge: 2020-02-16 | DRG: 291 | Discharge status: Against Medical Advice | Payer: Self-pay | Attending: Internal Medicine | Admitting: Internal Medicine

## 2020-02-15 DIAGNOSIS — E1122 Type 2 diabetes mellitus with diabetic chronic kidney disease: Secondary | ICD-10-CM | POA: Diagnosis present

## 2020-02-15 DIAGNOSIS — Z7982 Long term (current) use of aspirin: Secondary | ICD-10-CM

## 2020-02-15 DIAGNOSIS — I160 Hypertensive urgency: Secondary | ICD-10-CM

## 2020-02-15 DIAGNOSIS — Z8049 Family history of malignant neoplasm of other genital organs: Secondary | ICD-10-CM

## 2020-02-15 DIAGNOSIS — N182 Chronic kidney disease, stage 2 (mild): Secondary | ICD-10-CM

## 2020-02-15 DIAGNOSIS — Z9981 Dependence on supplemental oxygen: Secondary | ICD-10-CM

## 2020-02-15 DIAGNOSIS — Z20822 Contact with and (suspected) exposure to covid-19: Secondary | ICD-10-CM | POA: Diagnosis present

## 2020-02-15 DIAGNOSIS — J9601 Acute respiratory failure with hypoxia: Secondary | ICD-10-CM

## 2020-02-15 DIAGNOSIS — E782 Mixed hyperlipidemia: Secondary | ICD-10-CM | POA: Diagnosis present

## 2020-02-15 DIAGNOSIS — G4733 Obstructive sleep apnea (adult) (pediatric): Secondary | ICD-10-CM | POA: Diagnosis present

## 2020-02-15 DIAGNOSIS — F329 Major depressive disorder, single episode, unspecified: Secondary | ICD-10-CM | POA: Diagnosis present

## 2020-02-15 DIAGNOSIS — Z8249 Family history of ischemic heart disease and other diseases of the circulatory system: Secondary | ICD-10-CM

## 2020-02-15 DIAGNOSIS — Z6833 Body mass index (BMI) 33.0-33.9, adult: Secondary | ICD-10-CM

## 2020-02-15 DIAGNOSIS — R0902 Hypoxemia: Secondary | ICD-10-CM

## 2020-02-15 DIAGNOSIS — I361 Nonrheumatic tricuspid (valve) insufficiency: Secondary | ICD-10-CM

## 2020-02-15 DIAGNOSIS — E119 Type 2 diabetes mellitus without complications: Secondary | ICD-10-CM

## 2020-02-15 DIAGNOSIS — E1121 Type 2 diabetes mellitus with diabetic nephropathy: Secondary | ICD-10-CM

## 2020-02-15 DIAGNOSIS — I130 Hypertensive heart and chronic kidney disease with heart failure and stage 1 through stage 4 chronic kidney disease, or unspecified chronic kidney disease: Principal | ICD-10-CM | POA: Diagnosis present

## 2020-02-15 DIAGNOSIS — Z7984 Long term (current) use of oral hypoglycemic drugs: Secondary | ICD-10-CM

## 2020-02-15 DIAGNOSIS — I509 Heart failure, unspecified: Secondary | ICD-10-CM

## 2020-02-15 DIAGNOSIS — Z79899 Other long term (current) drug therapy: Secondary | ICD-10-CM

## 2020-02-15 DIAGNOSIS — I5043 Acute on chronic combined systolic (congestive) and diastolic (congestive) heart failure: Secondary | ICD-10-CM

## 2020-02-15 DIAGNOSIS — R0603 Acute respiratory distress: Secondary | ICD-10-CM

## 2020-02-15 DIAGNOSIS — I429 Cardiomyopathy, unspecified: Secondary | ICD-10-CM | POA: Diagnosis present

## 2020-02-15 DIAGNOSIS — Z87891 Personal history of nicotine dependence: Secondary | ICD-10-CM

## 2020-02-15 DIAGNOSIS — R000 Tachycardia, unspecified: Secondary | ICD-10-CM | POA: Diagnosis present

## 2020-02-15 LAB — CBC WITH DIFFERENTIAL/PLATELET
Abs Immature Granulocytes: 0.01 10*3/uL (ref 0.00–0.07)
Basophils Absolute: 0 10*3/uL (ref 0.0–0.1)
Basophils Relative: 0 %
Eosinophils Absolute: 0 10*3/uL (ref 0.0–0.5)
Eosinophils Relative: 0 %
HCT: 45.6 % (ref 39.0–52.0)
Hemoglobin: 15.2 g/dL (ref 13.0–17.0)
Immature Granulocytes: 0 %
Lymphocytes Relative: 11 %
Lymphs Abs: 0.7 10*3/uL (ref 0.7–4.0)
MCH: 29.7 pg (ref 26.0–34.0)
MCHC: 33.3 g/dL (ref 30.0–36.0)
MCV: 89.1 fL (ref 80.0–100.0)
Monocytes Absolute: 0.1 10*3/uL (ref 0.1–1.0)
Monocytes Relative: 1 %
Neutro Abs: 5.5 10*3/uL (ref 1.7–7.7)
Neutrophils Relative %: 88 %
Platelets: 194 10*3/uL (ref 150–400)
RBC: 5.12 MIL/uL (ref 4.22–5.81)
RDW: 12.6 % (ref 11.5–15.5)
WBC: 6.3 10*3/uL (ref 4.0–10.5)
nRBC: 0 % (ref 0.0–0.2)

## 2020-02-15 LAB — BASIC METABOLIC PANEL
Anion gap: 15 (ref 5–15)
BUN: 18 mg/dL (ref 6–20)
CO2: 16 mmol/L — ABNORMAL LOW (ref 22–32)
Calcium: 8.6 mg/dL — ABNORMAL LOW (ref 8.9–10.3)
Chloride: 108 mmol/L (ref 98–111)
Creatinine, Ser: 1.64 mg/dL — ABNORMAL HIGH (ref 0.61–1.24)
GFR calc Af Amer: 55 mL/min — ABNORMAL LOW (ref >60)
GFR calc non Af Amer: 47 mL/min — ABNORMAL LOW (ref >60)
Glucose, Bld: 252 mg/dL — ABNORMAL HIGH (ref 70–99)
Potassium: 3.9 mmol/L (ref 3.5–5.1)
Sodium: 139 mmol/L (ref 135–145)

## 2020-02-15 LAB — CBC
HCT: 50 % (ref 39.0–52.0)
Hemoglobin: 16.2 g/dL (ref 13.0–17.0)
MCH: 29.8 pg (ref 26.0–34.0)
MCHC: 32.4 g/dL (ref 30.0–36.0)
MCV: 91.9 fL (ref 80.0–100.0)
Platelets: 247 10*3/uL (ref 150–400)
RBC: 5.44 MIL/uL (ref 4.22–5.81)
RDW: 12.6 % (ref 11.5–15.5)
WBC: 9.4 10*3/uL (ref 4.0–10.5)
nRBC: 0 % (ref 0.0–0.2)

## 2020-02-15 LAB — TROPONIN I (HIGH SENSITIVITY)
Troponin I (High Sensitivity): 46 ng/L — ABNORMAL HIGH (ref <18)
Troponin I (High Sensitivity): 52 ng/L — ABNORMAL HIGH (ref <18)
Troponin I (High Sensitivity): 51 ng/L — ABNORMAL HIGH (ref <18)

## 2020-02-15 LAB — URINALYSIS, ROUTINE W REFLEX MICROSCOPIC
Bilirubin Urine: NEGATIVE
Glucose, UA: NEGATIVE mg/dL
Hgb urine dipstick: NEGATIVE
Ketones, ur: NEGATIVE mg/dL
Leukocytes,Ua: NEGATIVE
Nitrite: NEGATIVE
Protein, ur: NEGATIVE mg/dL
Specific Gravity, Urine: 1.008 (ref 1.005–1.030)
pH: 5 (ref 5.0–8.0)

## 2020-02-15 LAB — COMPREHENSIVE METABOLIC PANEL
ALT: 86 U/L — ABNORMAL HIGH (ref 0–44)
AST: 62 U/L — ABNORMAL HIGH (ref 15–41)
Albumin: 3.8 g/dL (ref 3.5–5.0)
Alkaline Phosphatase: 77 U/L (ref 38–126)
Anion gap: 11 (ref 5–15)
BUN: 18 mg/dL (ref 6–20)
CO2: 23 mmol/L (ref 22–32)
Calcium: 8.9 mg/dL (ref 8.9–10.3)
Chloride: 106 mmol/L (ref 98–111)
Creatinine, Ser: 1.61 mg/dL — ABNORMAL HIGH (ref 0.61–1.24)
GFR calc Af Amer: 56 mL/min — ABNORMAL LOW (ref >60)
GFR calc non Af Amer: 48 mL/min — ABNORMAL LOW (ref >60)
Glucose, Bld: 169 mg/dL — ABNORMAL HIGH (ref 70–99)
Potassium: 3.8 mmol/L (ref 3.5–5.1)
Sodium: 140 mmol/L (ref 135–145)
Total Bilirubin: 0.9 mg/dL (ref 0.3–1.2)
Total Protein: 6.7 g/dL (ref 6.5–8.1)

## 2020-02-15 LAB — GLUCOSE, CAPILLARY
Glucose-Capillary: 138 mg/dL — ABNORMAL HIGH (ref 70–99)
Glucose-Capillary: 159 mg/dL — ABNORMAL HIGH (ref 70–99)

## 2020-02-15 LAB — MAGNESIUM: Magnesium: 2.2 mg/dL (ref 1.7–2.4)

## 2020-02-15 LAB — ECHOCARDIOGRAM COMPLETE
Height: 70 in
Weight: 4372.8 oz

## 2020-02-15 LAB — TSH: TSH: 1.223 u[IU]/mL (ref 0.350–4.500)

## 2020-02-15 LAB — BRAIN NATRIURETIC PEPTIDE: B Natriuretic Peptide: 258 pg/mL — ABNORMAL HIGH (ref 0.0–100.0)

## 2020-02-15 LAB — HIV ANTIBODY (ROUTINE TESTING W REFLEX): HIV Screen 4th Generation wRfx: NONREACTIVE

## 2020-02-15 LAB — POC SARS CORONAVIRUS 2 AG -  ED: SARS Coronavirus 2 Ag: NEGATIVE

## 2020-02-15 MED ORDER — FUROSEMIDE 10 MG/ML IJ SOLN
80.0000 mg | Freq: Two times a day (BID) | INTRAMUSCULAR | Status: DC
  Administered 2020-02-15 – 2020-02-16 (×2): 80 mg via INTRAVENOUS
  Filled 2020-02-15 (×2): qty 8

## 2020-02-15 MED ORDER — ACETAMINOPHEN 325 MG PO TABS
650.0000 mg | ORAL_TABLET | Freq: Four times a day (QID) | ORAL | Status: DC | PRN
  Administered 2020-02-15 – 2020-02-16 (×3): 650 mg via ORAL
  Filled 2020-02-15 (×4): qty 2

## 2020-02-15 MED ORDER — FUROSEMIDE 10 MG/ML IJ SOLN
80.0000 mg | Freq: Once | INTRAMUSCULAR | Status: AC
  Administered 2020-02-15: 80 mg via INTRAVENOUS
  Filled 2020-02-15: qty 8

## 2020-02-15 MED ORDER — EZETIMIBE 10 MG PO TABS
10.0000 mg | ORAL_TABLET | Freq: Every day | ORAL | Status: DC
  Administered 2020-02-15 – 2020-02-16 (×2): 10 mg via ORAL
  Filled 2020-02-15 (×2): qty 1

## 2020-02-15 MED ORDER — CARVEDILOL 12.5 MG PO TABS
12.5000 mg | ORAL_TABLET | Freq: Two times a day (BID) | ORAL | Status: DC
  Administered 2020-02-15 – 2020-02-16 (×4): 12.5 mg via ORAL
  Filled 2020-02-15 (×4): qty 1

## 2020-02-15 MED ORDER — NITROGLYCERIN IN D5W 200-5 MCG/ML-% IV SOLN
0.0000 ug/min | INTRAVENOUS | Status: DC
  Administered 2020-02-16: 16.667 ug/min via INTRAVENOUS
  Filled 2020-02-15 (×3): qty 250

## 2020-02-15 MED ORDER — FUROSEMIDE 10 MG/ML IJ SOLN
40.0000 mg | Freq: Two times a day (BID) | INTRAMUSCULAR | Status: DC
  Filled 2020-02-15: qty 4

## 2020-02-15 MED ORDER — ACETAMINOPHEN 650 MG RE SUPP: 650.0000 mg | Freq: Four times a day (QID) | RECTAL | Status: DC | PRN

## 2020-02-15 MED ORDER — TIMOLOL MALEATE 0.5 % OP SOLN
1.0000 [drp] | Freq: Every day | OPHTHALMIC | Status: DC
  Administered 2020-02-15 – 2020-02-16 (×2): 1 [drp] via OPHTHALMIC
  Filled 2020-02-15: qty 5

## 2020-02-15 MED ORDER — HYDRALAZINE HCL 50 MG PO TABS
50.0000 mg | ORAL_TABLET | Freq: Three times a day (TID) | ORAL | Status: DC
  Administered 2020-02-15 – 2020-02-16 (×4): 50 mg via ORAL
  Filled 2020-02-15 (×5): qty 1

## 2020-02-15 MED ORDER — INSULIN ASPART 100 UNIT/ML ~~LOC~~ SOLN
0.0000 [IU] | Freq: Three times a day (TID) | SUBCUTANEOUS | Status: DC
  Administered 2020-02-15: 2 [IU] via SUBCUTANEOUS
  Administered 2020-02-15 – 2020-02-16 (×2): 1 [IU] via SUBCUTANEOUS

## 2020-02-15 MED ORDER — ENOXAPARIN SODIUM 40 MG/0.4ML ~~LOC~~ SOLN
40.0000 mg | Freq: Every day | SUBCUTANEOUS | Status: DC
  Administered 2020-02-15 – 2020-02-16 (×2): 40 mg via SUBCUTANEOUS
  Filled 2020-02-15 (×2): qty 0.4

## 2020-02-15 MED ORDER — ATORVASTATIN CALCIUM 80 MG PO TABS
80.0000 mg | ORAL_TABLET | Freq: Every day | ORAL | Status: DC
  Administered 2020-02-15 – 2020-02-16 (×2): 80 mg via ORAL
  Filled 2020-02-15 (×2): qty 1

## 2020-02-15 MED ORDER — FLUOXETINE HCL 20 MG PO CAPS
20.0000 mg | ORAL_CAPSULE | Freq: Every day | ORAL | Status: DC
  Administered 2020-02-15 – 2020-02-16 (×2): 20 mg via ORAL
  Filled 2020-02-15 (×2): qty 1

## 2020-02-15 MED ORDER — NITROGLYCERIN IN D5W 200-5 MCG/ML-% IV SOLN
INTRAVENOUS | Status: AC
  Administered 2020-02-15: 100 ug/min
  Filled 2020-02-15: qty 250

## 2020-02-15 MED ORDER — LATANOPROST 0.005 % OP SOLN
1.0000 [drp] | Freq: Every day | OPHTHALMIC | Status: DC
  Administered 2020-02-15: 1 [drp] via OPHTHALMIC
  Filled 2020-02-15: qty 2.5

## 2020-02-15 NOTE — Progress Notes (Addendum)
  ReDS Clip Diuretic Study Pt study # [id number]  Your patient has been enrolled in the ReDS Clip Diuretic Study   Changes to prescribed diuretics recommended:  Consider Furosemide 80mg  IV BID Provider contacted: Dr Earlie Counts Recommendation    REDS Clip  READING= 50  CHEST RULER =34 Clip Station = D   Orthodema score =  Signs/Symptoms Score   Mild edema, no orthopnea 0 No congestion  Moderate edema, no orthopnea 1 Low-grade orthodema/congestion  Severe edema OR orthopnea 2   Moderate edema and orthopnea 3 High-grade orthodema/congestion  Severe edema AND orthopnea 4     Bonnita Nasuti Pharm.D. CPP, BCPS Clinical Pharmacist [phone] 02/15/2020 8:39 AM

## 2020-02-15 NOTE — ED Provider Notes (Signed)
Emergency Department Provider Note   I have reviewed the triage vital signs and the nursing notes.   HISTORY  Chief Complaint Shortness of Breath   HPI Matthew [name]. is a 54 y.o. male with a history of heart failure/cardiomyopathy and diabetes who presents the emerge department today with acute onset of shortness of breath.  Patient states he went to bed and was feeling normal and wears CPAP at night.  He woke up suddenly feeling significantly severely dyspneic. EMS was called and patient was hypertensive, tachypneic hypoxic to 59% on room air.  Was started on CPAP and brought here for further evaluation.  Patient denies any recent illnesses or chest pain.  Last tested for Covid a few weeks ago.  States compliance with his medications.  Did eat a large spaghetti meal tonight but no other dietary abnormalities.   No other associated or modifying symptoms.    Past Medical History:  Diagnosis Date  . Acute combined systolic and diastolic CHF, NYHA class 3 ([place])    a. 02/2014 Echo: EF 25-30%.  . Cardiomyopathy ([place])    a. 02/2014 Echo: EF 25-30%, sev glob HK with inferolat HK->AK, mod conc LVH, Gr 2 DD, Mild MR, sev dil LA.  . Depression   . DM (diabetes mellitus) ([place])   . High cholesterol   . Hypertension   . Morbid obesity ([place])   . Tobacco abuse     Patient Active Problem List   Diagnosis Date Noted  . Acute respiratory failure with hypoxia ([place]) 02/15/2020  . Acute on chronic combined systolic and diastolic CHF (congestive heart failure) ([place]) 02/15/2020  . Hypertensive urgency 02/15/2020  . CHF (congestive heart failure) ([place]) 02/15/2020  . 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 Expressive aphasia 02/20/2017  . Mixed hyperlipidemia   . History of stroke 01/31/2017  . Erectile dysfunction 07/30/2016  . OSA (obstructive sleep apnea) 05/10/2015  . CKD (chronic kidney disease) stage 2, GFR 60-89 ml/min 03/10/2014  . Diabetes mellitus type II, controlled  ([place]) 03/10/2014  . Chronic combined systolic and diastolic congestive heart failure ([place]) 03/03/2014  . Smoker   . Morbid obesity ([place])   . Hypertension 03/02/2014    Past Surgical History:  Procedure Laterality Date  . CATARACT EXTRACTION Right   . LEFT HEART CATHETERIZATION WITH CORONARY ANGIOGRAM N/A 03/04/2014   Procedure: LEFT HEART CATHETERIZATION WITH CORONARY ANGIOGRAM;  Surgeon: Burnell Blanks, MD;  Location[facility] C[facility] LAB;  Service: Cardiovascular;  Laterality: N/A;  . TEE WITHOUT CARDIOVERSION N/A 02/04/2017   Procedure: TRANSESOPHAGEAL ECHOCARDIOGRAM (TEE);  Surgeon: Dorothy Spark, MD;  Location: [facility] ENDOSCOPY;  Service: Cardiovascular;  Laterality: N/A;      Allergies Patient has no known allergies.  Family History  Problem Relation Age of Onset  . Heart attack Father        died @ 74  . Cervical cancer Mother        died @ 75    Social History Social History   Tobacco Use  . Smoking status: Former Smoker    Years: 20.00    Types: Cigarettes    Quit date: 03/02/2014    Years since quitting: 5.9  . Smokeless tobacco: Never Used  Substance Use Topics  . Alcohol use: No  . Drug use: No    Review of Systems  All other systems negative except as documented in the HPI. All pertinent positives and negatives as reviewed in the HPI. ____________________________________________   PHYSICAL EXAM:  VITAL SIGNS: ED Triage Vitals  Enc Vitals Group     BP 02/15/20 0131 (!) 201/135     Pulse Rate 02/15/20 0132 (!) 112     Resp 02/15/20 0136 (!) 42     Temp --      Temp src --      SpO2 02/15/20 0132 93 %     Weight --     Constitutional: Alert and oriented. Diaphoretic, resp distress. Eyes: Conjunctivae are normal. PERRL. EOMI. Head: Atraumatic. Nose: No congestion/rhinnorhea. Mouth/Throat: Mucous membranes are moist.  Oropharynx non-erythematous. Neck: No stridor.  No meningeal signs.   Cardiovascular: Normal rate, regular rhythm. Good  peripheral circulation. Grossly normal heart sounds.   Respiratory: Normal respiratory effort.  No retractions. Lungs CTAB. Gastrointestinal: Soft and nontender. No distention.  Musculoskeletal: No lower extremity tenderness nor edema. No gross deformities of extremities. Neurologic:  Normal speech and language. No gross focal neurologic deficits are appreciated.  Skin:  Skin is warm, diaphoretic and intact. No rash noted.   ____________________________________________   LABS (all labs ordered are listed, but only abnormal results are displayed)  Labs Reviewed  BASIC METABOLIC PANEL - Abnormal; Notable for the following components:      Result Value   CO2 16 (*)    Glucose, Bld 252 (*)    Creatinine, Ser 1.64 (*)    Calcium 8.6 (*)    GFR calc non Af Amer 47 (*)    GFR calc Af Amer 55 (*)    All other components within normal limits  BRAIN NATRIURETIC PEPTIDE - Abnormal; Notable for the following components:   B Natriuretic Peptide 258.0 (*)    All other components within normal limits  GLUCOSE, CAPILLARY - Abnormal; Notable for the following components:   Glucose-Capillary 138 (*)    All other components within normal limits  TROPONIN I (HIGH SENSITIVITY) - Abnormal; Notable for the following components:   Troponin I (High Sensitivity) 46 (*)    All other components within normal limits  SARS CORONAVIRUS 2 (TAT 6-24 HRS)  CBC  HIV ANTIBODY (ROUTINE TESTING W REFLEX)  CBC  COMPREHENSIVE METABOLIC PANEL  MAGNESIUM  CBC WITH DIFFERENTIAL/PLATELET  TSH  URINALYSIS, ROUTINE W REFLEX MICROSCOPIC  POC SARS CORONAVIRUS 2 AG -  ED  TROPONIN I (HIGH SENSITIVITY)  TROPONIN I (HIGH SENSITIVITY)   ____________________________________________  EKG   EKG Interpretation  Date/Time:  Monday February 15 2020 01:33:24 EST Ventricular Rate:  109 PR Interval:    QRS Duration: 106 QT Interval:  322 QTC Calculation: 434 R Axis:   66 Text Interpretation: Sinus tachycardia Probable  left atrial enlargement Borderline T wave abnormalities Confirmed by Merrily Pew [phone]) on 02/15/2020 2:40:24 AM       ____________________________________________  RADIOLOGY  DG Chest Portable 1 View  Result Date: 02/15/2020 CLINICAL DATA:  Shortness of breath EXAM: PORTABLE CHEST 1 VIEW COMPARISON:  9/27/9 FINDINGS: Cardiac shadow is prominent but stable. Mild vascular congestion is seen. No sizable effusion is noted. No focal infiltrate is seen. IMPRESSION: Increased vascular congestion consistent with CHF. Electronically Signed   By: Inez Catalina M.D.   On: 02/15/2020 01:55    ____________________________________________   PROCEDURES  Procedure(s) performed:   .Critical Care Performed by: Merrily Pew, MD Authorized by: Merrily Pew, MD   Critical care provider statement:    Critical care time (minutes):  45   Critical care was necessary to treat or prevent imminent or life-threatening deterioration of the following conditions:  Respiratory failure  Critical care was time spent personally by me on the following activities:  Discussions with consultants, evaluation of patient's response to treatment, examination of patient, ordering and performing treatments and interventions, ordering and review of laboratory studies, ordering and review of radiographic studies, pulse oximetry, re-evaluation of patient's condition, obtaining history from patient or surrogate and review of old charts   I assumed direction of critical care for this patient from another provider in my specialty: no       ____________________________________________   INITIAL IMPRESSION / ASSESSMENT AND PLAN / ED COURSE  X-ray viewed by me appears to be consistent with pulmonary edema continue the BiPAP and started to rapidly titrate nitroglycerin infusion up until his blood pressure normalized and slowly titrated down to help with vasodilation.  Patient with significant improvement in symptoms over the  first 30 to 45 minutes and multiple reevaluations later proved respiratory distress still tachypneic.  Will hold on taking off the BiPAP. Continue titrating nitroglycerin drip as able.  Will discuss with hospitalist for admission to the stepdown unit.  Pertinent labs & imaging results that were available during my care of the patient were reviewed by me and considered in my medical decision making (see chart for details).  ____________________________________________  FINAL CLINICAL IMPRESSION(S) / ED DIAGNOSES  Final diagnoses:  Acute on chronic congestive heart failure, unspecified heart failure type (HCC)  Hypoxia  Respiratory distress     MEDICATIONS GIVEN DURING THIS VISIT:  Medications  atorvastatin (LIPITOR) tablet 80 mg (has no administration in time range)  carvedilol (COREG) tablet 12.5 mg (has no administration in time range)  ezetimibe (ZETIA) tablet 10 mg (has no administration in time range)  hydrALAZINE (APRESOLINE) tablet 50 mg (has no administration in time range)  FLUoxetine (PROZAC) capsule 20 mg (has no administration in time range)  acetaminophen (TYLENOL) tablet 650 mg (has no administration in time range)    Or  acetaminophen (TYLENOL) suppository 650 mg (has no administration in time range)  insulin aspart (novoLOG) injection 0-9 Units (has no administration in time range)  enoxaparin (LOVENOX) injection 40 mg (has no administration in time range)  furosemide (LASIX) injection 40 mg (has no administration in time range)  nitroGLYCERIN 50 mg in dextrose 5 % 250 mL (0.2 mg/mL) infusion (100 mcg/min Intravenous Restarted 02/15/20 0653)  nitroGLYCERIN 0.2 mg/mL in dextrose 5 % infusion (100 mcg/min  Rate/Dose Change 02/15/20 0207)  furosemide (LASIX) injection 80 mg (80 mg Intravenous Given 02/15/20 0306)     NEW OUTPATIENT MEDICATIONS STARTED DURING THIS VISIT:  Current Discharge Medication List      Note:  This note was prepared with assistance of Dragon voice  recognition software. Occasional wrong-word or sound-a-like substitutions may have occurred due to the inherent limitations of voice recognition software.   Louisa Favaro, Corene Cornea, MD 02/15/20 [phone]

## 2020-02-15 NOTE — ED Notes (Signed)
Informed RN Claiborne Billings POC covid test neg

## 2020-02-15 NOTE — ED Notes (Addendum)
Nitro drip started at 147mcg/min per verbal order Dr. Dayna Barker.

## 2020-02-15 NOTE — ED Notes (Signed)
Patient wife Gean Quint calling asking for an update please call [phone]

## 2020-02-15 NOTE — Progress Notes (Signed)
PROGRESS NOTE    Matthew [name].  [id number] DOB: 1966/08/16 DOA: 02/15/2020 PCP: Charlott Rakes, MD    Brief Narrative:  54 y.o. male with history of chronic systolic and diastolic CHF last EF measured in 2018 was 40 to 45% with history of chronic kidney disease stage II diabetes mellitus type 2, hypertension morbid obesity presents to the ER because of worsening shortness of breath since last night.  Patient states he has not been taking his Lasix for almost a month now since he has been urinating frequently.  He not sure if he has gained weight recently.  But about 4 5 days ago he was mildly short of breath which resolved without any intervention.  Last night he became acutely short of breath but had no chest pain fever chills or productive cough.  ED Course: In the ER patient blood pressure was 201/135 chest x-ray was consistent with CHF.  EKG shows sinus tachycardia.  Covid antigen test was negative but PCR is pending.  Patient was requiring BiPAP with 30% FiO2 initially.  Was given 80 mg IV Lasix and was placed on nitroglycerin infusion following which blood pressure improved and the space status improved.  Has been having good urine output so far.  Admitted for acute respiratory failure secondary to acute on chronic combined systolic and diastolic heart failure.  Labs reveal creatinine 1.6 increased from baseline of around 1.2 blood glucose 252 CBC unremarkable.  Assessment & Plan:   Principal Problem:   Acute respiratory failure with hypoxia (HCC) Active Problems:   Morbid obesity (HCC)   CKD (chronic kidney disease) stage 2, GFR 60-89 ml/min   Diabetes mellitus type II, controlled (HCC)   OSA (obstructive sleep apnea)   Acute on chronic combined systolic and diastolic CHF (congestive heart failure) (HCC)   Hypertensive urgency   CHF (congestive heart failure) ([place])  1. Acute respiratory failure with hypoxia secondary to acute on chronic combined systolic and diastolic  CHF  1. last EF measured in 2018 was 40 to 45% 2. Continued on scheduled IV lasix 3. Initially on 40mg  BID lasix, will increase to 80mg  IV bid 2. Hypertensive urgency  1. Had been on nitroglycerin infusion.   2. Patient takes Coreg hydralazine.  Holding of lisinopril due to mildly worsening renal function.   3. BP seems improved 3. Diabetes mellitus type 2  1. Will continue SSI coverage  2. Glucose trends stable 4. Acute on chronic kidney disease stage II  1. creatinine currently stable at 1.6 2. Presently holding of lisinopril  3. Will repeat bmet in AM 5. Hyperlipidemia  1. Continue on statin as tolerated  DVT prophylaxis: Lovenox subq Code Status: Full Family Communication: Pt in room, family not at bedside Disposition Plan: From home, home when fully diuresed and off IV lasix  Consultants:     Procedures:     Antimicrobials: Anti-infectives (From admission, onward)   None      Subjective: Feeling better, questioning about going home despite remaining on IV lasix  Objective: Vitals:   02/15/20 0744 02/15/20 1100 02/15/20 1705 02/15/20 1706  BP: (!) 144/92 140/77 (!) 135/92   Pulse: 75 76  82  Resp: 17 18    Temp: 98.1 F (36.7 C)     TempSrc: Oral     SpO2: 96% 96%    Weight:      Height:        Intake/Output Summary (Last 24 hours) at 02/15/2020 1740 Last data filed at 02/15/2020 1232  Gross per 24 hour  Intake --  Output 2375 ml  Net -2375 ml   Filed Weights   02/15/20 0650  Weight: 124 kg    Examination:  General exam: Appears calm and comfortable  Respiratory system: Clear to auscultation. Respiratory effort normal. Cardiovascular system: S1 & S2 heard, Regular Gastrointestinal system: Abdomen is nondistended, pos BS Central nervous system: Alert and oriented. No focal neurological deficits. Extremities: Symmetric 5 x 5 power. Skin: No rashes, lesions  Psychiatry: Judgement and insight appear normal. Mood & affect appropriate.   Data  Reviewed: I have personally reviewed following labs and imaging studies  CBC: Recent Labs  Lab 02/15/20 0142 02/15/20 0659  WBC 9.4 6.3  NEUTROABS  --  5.5  HGB 16.2 15.2  HCT 50.0 45.6  MCV 91.9 89.1  PLT 247 [id number]   Basic Metabolic Panel: Recent Labs  Lab 02/15/20 0142 02/15/20 0659  NA 139 140  K 3.9 3.8  CL 108 106  CO2 16* 23  GLUCOSE 252* 169*  BUN 18 18  CREATININE 1.64* 1.61*  CALCIUM 8.6* 8.9  MG  --  2.2   GFR: Estimated Creatinine Clearance: 70.1 mL/min (A) (by C-G formula based on SCr of 1.61 mg/dL (H)). Liver Function Tests: Recent Labs  Lab 02/15/20 0659  AST 62*  ALT 86*  ALKPHOS 77  BILITOT 0.9  PROT 6.7  ALBUMIN 3.8   No results for input(s): LIPASE, AMYLASE in the last 168 hours. No results for input(s): AMMONIA in the last 168 hours. Coagulation Profile: No results for input(s): INR, PROTIME in the last 168 hours. Cardiac Enzymes: No results for input(s): CKTOTAL, CKMB, CKMBINDEX, TROPONINI in the last 168 hours. BNP (last 3 results) No results for input(s): PROBNP in the last 8760 hours. HbA1C: No results for input(s): HGBA1C in the last 72 hours. CBG: Recent Labs  Lab 02/15/20 0648 02/15/20 1736  GLUCAP 138* 159*   Lipid Profile: No results for input(s): CHOL, HDL, LDLCALC, TRIG, CHOLHDL, LDLDIRECT in the last 72 hours. Thyroid Function Tests: Recent Labs    02/15/20 0659  TSH 1.223   Anemia Panel: No results for input(s): VITAMINB12, FOLATE, FERRITIN, TIBC, IRON, RETICCTPCT in the last 72 hours. Sepsis Labs: No results for input(s): PROCALCITON, LATICACIDVEN in the last 168 hours.  No results found for this or any previous visit (from the past 240 hour(s)).   Radiology Studies: DG Chest Portable 1 View  Result Date: 02/15/2020 CLINICAL DATA:  Shortness of breath EXAM: PORTABLE CHEST 1 VIEW COMPARISON:  9/27/9 FINDINGS: Cardiac shadow is prominent but stable. Mild vascular congestion is seen. No sizable effusion is noted.  No focal infiltrate is seen. IMPRESSION: Increased vascular congestion consistent with CHF. Electronically Signed   By: Inez Catalina M.D.   On: 02/15/2020 01:55   ECHOCARDIOGRAM COMPLETE  Result Date: 02/15/2020    ECHOCARDIOGRAM REPORT   Patient Name:   Matthew [name]. Date of Exam: 02/15/2020 Medical Rec #:  [id number]           Height:       70.0 in Accession #:    [id number]          Weight:       273.3 lb Date of Birth:  [birth year]           BSA:          2.384 m Patient Age:    82 years            BP:  140/77 mmHg Patient Gender: M                   HR:           76 bpm. Exam Location:  Inpatient Procedure: 2D Echo, Cardiac Doppler and Color Doppler Indications:    CHF  History:        Patient has prior history of Echocardiogram examinations, most                 recent 02/04/2017. CHF and Cardiomyopathy,                 Signs/Symptoms:Shortness of Breath; Risk Factors:Diabetes,                 Hypertension, Current Smoker, Dyslipidemia and Obesity. CKD.  Sonographer:    Dustin Flock Referring Phys: [place]  1. Left ventricular ejection fraction, by estimation, is 40 to 45%. The left ventricle has mildly decreased function. The left ventricle has no regional wall motion abnormalities. There is moderate left ventricular hypertrophy. Left ventricular diastolic parameters were normal.  2. Right ventricular systolic function is normal. The right ventricular size is normal.  3. The mitral valve is normal in structure and function. Trivial mitral valve regurgitation. No evidence of mitral stenosis.  4. The aortic valve is normal in structure and function. Aortic valve regurgitation is not visualized. No aortic stenosis is present. FINDINGS  Left Ventricle: Left ventricular ejection fraction, by estimation, is 40 to 45%. The left ventricle has mildly decreased function. The left ventricle has no regional wall motion abnormalities. The left ventricular internal cavity size was  normal in size. There is moderate left ventricular hypertrophy. Left ventricular diastolic parameters were normal. Right Ventricle: The right ventricular size is normal. No increase in right ventricular wall thickness. Right ventricular systolic function is normal. Left Atrium: Left atrial size was normal in size. Right Atrium: Right atrial size was normal in size. Pericardium: There is no evidence of pericardial effusion. Mitral Valve: The mitral valve is normal in structure and function. Trivial mitral valve regurgitation. No evidence of mitral valve stenosis. Tricuspid Valve: The tricuspid valve is normal in structure. Tricuspid valve regurgitation is mild . No evidence of tricuspid stenosis. Aortic Valve: The aortic valve is normal in structure and function. Aortic valve regurgitation is not visualized. No aortic stenosis is present. Pulmonic Valve: The pulmonic valve was normal in structure. Pulmonic valve regurgitation is not visualized. No evidence of pulmonic stenosis. Aorta: The aortic root and ascending aorta are structurally normal, with no evidence of dilitation. IAS/Shunts: The atrial septum is grossly normal.  LEFT VENTRICLE PLAX 2D LVIDd:         6.34 cm  Diastology LVIDs:         4.99 cm  LV e' lateral:   5.87 cm/s LV PW:         1.31 cm  LV E/e' lateral: 7.8 LV IVS:        1.50 cm  LV e' medial:    3.37 cm/s LVOT diam:     2.80 cm  LV E/e' medial:  13.6 LV SV:         128 LV SV Index:   54 LVOT Area:     6.16 cm  RIGHT VENTRICLE             IVC RV Basal diam:  3.49 cm     IVC diam: 2.35 cm RV Mid diam:    3.15  cm RV S prime:     19.20 cm/s TAPSE (M-mode): 3.2 cm LEFT ATRIUM             Index       RIGHT ATRIUM           Index LA diam:        4.90 cm 2.06 cm/m  RA Area:     15.80 cm LA Vol (A2C):   84.1 ml 35.28 ml/m RA Volume:   38.80 ml  16.28 ml/m LA Vol (A4C):   58.2 ml 24.42 ml/m LA Biplane Vol: 74.0 ml 31.05 ml/m  AORTIC VALVE LVOT Vmax:   109.00 cm/s LVOT Vmean:  79.900 cm/s LVOT VTI:     0.208 m  AORTA Ao Root diam: 2.90 cm Ao Asc diam:  3.80 cm MITRAL VALVE MV Area (PHT): 3.39 cm    SHUNTS MV Decel Time: 224 msec    Systemic VTI:  0.21 m MV E velocity: 45.90 cm/s  Systemic Diam: 2.80 cm MV A velocity: 44.70 cm/s MV E/A ratio:  1.03 Mertie Moores MD Electronically signed by Mertie Moores MD Signature Date/Time: 02/15/2020/4:01:09 PM    Final     Scheduled Meds: . atorvastatin  80 mg Oral Daily  . carvedilol  12.5 mg Oral BID WC  . enoxaparin (LOVENOX) injection  40 mg Subcutaneous Daily  . ezetimibe  10 mg Oral Daily  . FLUoxetine  20 mg Oral Daily  . furosemide  80 mg Intravenous BID  . hydrALAZINE  50 mg Oral Q8H  . insulin aspart  0-9 Units Subcutaneous TID WC  . latanoprost  1 drop Both Eyes QHS  . timolol  1 drop Both Eyes Daily   Continuous Infusions: . nitroGLYCERIN 100 mcg/min (02/15/20 0653)     LOS: 0 days   Marylu Lund, MD [facility] Pager On Amion  If 7PM-7AM, please contact night-coverage 02/15/2020, 5:40 PM

## 2020-02-15 NOTE — ED Triage Notes (Signed)
Pt BIB [facility] from home, c/o sudden onset shortness of breath. On EMS arrival, pt SpO2 57% room air, only improved to 852% on CPAP. Pt arrives with NRB @ 15L and diaphoretic. Hx CHF, given 2g mag, 125mg  solumedrol by EMS PTA.

## 2020-02-15 NOTE — H&P (Signed)
History and Physical    Matthew [name]. EX:[id number] DOB: December 09, 1966 DOA: 02/15/2020  PCP: Charlott Rakes, MD  Patient coming from: Home.  Chief Complaint: Shortness of breath.  HPI: Matthew [name]. is a 54 y.o. male with history of chronic systolic and diastolic CHF last EF measured in 2018 was 40 to 45% with history of chronic kidney disease stage II diabetes mellitus type 2, hypertension morbid obesity presents to the ER because of worsening shortness of breath since last night.  Patient states he has not been taking his Lasix for almost a month now since he has been urinating frequently.  He not sure if he has gained weight recently.  But about 4 5 days ago he was mildly short of breath which resolved without any intervention.  Last night he became acutely short of breath but had no chest pain fever chills or productive cough.  ED Course: In the ER patient blood pressure was 201/135 chest x-ray was consistent with CHF.  EKG shows sinus tachycardia.  Covid antigen test was negative but PCR is pending.  Patient was requiring BiPAP with 30% FiO2 initially.  Was given 80 mg IV Lasix and was placed on nitroglycerin infusion following which blood pressure improved and the space status improved.  Has been having good urine output so far.  Admitted for acute respiratory failure secondary to acute on chronic combined systolic and diastolic heart failure.  Labs reveal creatinine 1.6 increased from baseline of around 1.2 blood glucose 252 CBC unremarkable.  Review of Systems: As per HPI, rest all negative.   Past Medical History:  Diagnosis Date  . Acute combined systolic and diastolic CHF, NYHA class 3 ([place])    a. 02/2014 Echo: EF 25-30%.  . Cardiomyopathy ([place])    a. 02/2014 Echo: EF 25-30%, sev glob HK with inferolat HK->AK, mod conc LVH, Gr 2 DD, Mild MR, sev dil LA.  . Depression   . DM (diabetes mellitus) ([place])   . High cholesterol   . Hypertension   . Morbid obesity ([place])   .  Tobacco abuse     Past Surgical History:  Procedure Laterality Date  . CATARACT EXTRACTION Right   . LEFT HEART CATHETERIZATION WITH CORONARY ANGIOGRAM N/A 03/04/2014   Procedure: LEFT HEART CATHETERIZATION WITH CORONARY ANGIOGRAM;  Surgeon: Burnell Blanks, MD;  Location: [facility] CATH LAB;  Service: Cardiovascular;  Laterality: N/A;  . TEE WITHOUT CARDIOVERSION N/A 02/04/2017   Procedure: TRANSESOPHAGEAL ECHOCARDIOGRAM (TEE);  Surgeon: Dorothy Spark, MD;  Location: [facility] ENDOSCOPY;  Service: Cardiovascular;  Laterality: N/A;     reports that he quit smoking about 5 years ago. His smoking use included cigarettes. He quit after 20.00 years of use. He has never used smokeless tobacco. He reports that he does not drink alcohol or use drugs.  No Known Allergies  Family History  Problem Relation Age of Onset  . Heart attack Father        died @ 17  . Cervical cancer Mother        died @ 28    Prior to Admission medications   Medication Sig Start Date End Date Taking? Authorizing Provider  acetaminophen (TYLENOL) 500 MG tablet Take 500 mg by mouth every 6 (six) hours as needed for mild pain.    [provider]  aspirin 81 MG tablet Take 1 tablet (81 mg total) by mouth daily. Patient not taking: Reported on 11/18/2018 07/11/18   Charlott Rakes, MD  atorvastatin (LIPITOR)  80 MG tablet Take 1 tablet (80 mg total) by mouth daily. 12/02/19   Charlott Rakes, MD  carvedilol (COREG) 12.5 MG tablet Take 1 tablet (12.5 mg total) by mouth 2 (two) times daily with a meal. Must have office visit for refills 12/02/19   Charlott Rakes, MD  ezetimibe (ZETIA) 10 MG tablet Take 1 tablet (10 mg total) by mouth daily. 12/02/19   Charlott Rakes, MD  FLUoxetine (PROZAC) 20 MG tablet Take 1 tablet (20 mg total) by mouth daily. 12/02/19   Charlott Rakes, MD  furosemide (LASIX) 40 MG tablet Take 1 tablet (40 mg total) by mouth daily. 12/02/19   Charlott Rakes, MD  glipiZIDE (GLUCOTROL) 5 MG tablet  Take 1 tablet (5 mg total) by mouth 2 (two) times daily before a meal. Must have office visit for refills 12/02/19   Charlott Rakes, MD  glucose blood (ONE TOUCH ULTRA TEST) test strip Use as instructed Patient not taking: Reported on 02/19/2019 12/31/16   Charlott Rakes, MD  hydrALAZINE (APRESOLINE) 50 MG tablet Take 1 tablet (50 mg total) by mouth 3 (three) times daily. 12/02/19   Charlott Rakes, MD  ibuprofen (ADVIL,MOTRIN) 800 MG tablet Take 1 tablet (800 mg total) by mouth every 6 (six) hours as needed. Patient not taking: Reported on 02/19/2019 09/12/18   Dalia Heading, PA-C  lisinopril (ZESTRIL) 5 MG tablet Take 1 tablet (5 mg total) by mouth daily. 12/02/19   Charlott Rakes, MD  lovastatin (MEVACOR) 20 MG tablet Take 2 tablets (40 mg total) by mouth at bedtime. 06/24/14 08/26/14  Lorayne Marek, MD    Physical Exam: Constitutional: Moderately built and nourished. Vitals:   02/15/20 0306 02/15/20 0309 02/15/20 0315 02/15/20 0330  BP: 137/90 132/84 (!) 133/93 (!) 147/91  Pulse: 79 78 81 98  Resp: 18 (!) 24 (!) 23 (!) 36  SpO2: 100% 100% 100% 100%   Eyes: Anicteric no pallor. ENMT: No discharge from the ears eyes nose or mouth. Neck: JVD elevated no mass felt. Respiratory: No rhonchi or crepitations. Cardiovascular: S1-S2 heard. Abdomen: Soft nontender bowel sounds present. Musculoskeletal: No edema.  No joint effusion. Skin: No rash. Neurologic: Alert awake oriented to time place and person.  Moves all extremities. Psychiatric: Appears normal but normal affect.   Labs on Admission: I have personally reviewed following labs and imaging studies  CBC: Recent Labs  Lab 02/15/20 0142  WBC 9.4  HGB 16.2  HCT 50.0  MCV 91.9  PLT [id number]   Basic Metabolic Panel: Recent Labs  Lab 02/15/20 0142  NA 139  K 3.9  CL 108  CO2 16*  GLUCOSE 252*  BUN 18  CREATININE 1.64*  CALCIUM 8.6*   GFR: CrCl cannot be calculated (Unknown ideal weight.). Liver Function Tests: No  results for input(s): AST, ALT, ALKPHOS, BILITOT, PROT, ALBUMIN in the last 168 hours. No results for input(s): LIPASE, AMYLASE in the last 168 hours. No results for input(s): AMMONIA in the last 168 hours. Coagulation Profile: No results for input(s): INR, PROTIME in the last 168 hours. Cardiac Enzymes: No results for input(s): CKTOTAL, CKMB, CKMBINDEX, TROPONINI in the last 168 hours. BNP (last 3 results) No results for input(s): PROBNP in the last 8760 hours. HbA1C: No results for input(s): HGBA1C in the last 72 hours. CBG: No results for input(s): GLUCAP in the last 168 hours. Lipid Profile: No results for input(s): CHOL, HDL, LDLCALC, TRIG, CHOLHDL, LDLDIRECT in the last 72 hours. Thyroid Function Tests: No results for input(s): TSH, T4TOTAL, FREET4, T3FREE,  THYROIDAB in the last 72 hours. Anemia Panel: No results for input(s): VITAMINB12, FOLATE, FERRITIN, TIBC, IRON, RETICCTPCT in the last 72 hours. Urine analysis: No results found for: COLORURINE, APPEARANCEUR, LABSPEC, PHURINE, GLUCOSEU, HGBUR, BILIRUBINUR, KETONESUR, PROTEINUR, UROBILINOGEN, NITRITE, LEUKOCYTESUR Sepsis Labs: @LABRCNTIP (procalcitonin:4,lacticidven:4) )No results found for this or any previous visit (from the past 240 hour(s)).   Radiological Exams on Admission: DG Chest Portable 1 View  Result Date: 02/15/2020 CLINICAL DATA:  Shortness of breath EXAM: PORTABLE CHEST 1 VIEW COMPARISON:  9/27/9 FINDINGS: Cardiac shadow is prominent but stable. Mild vascular congestion is seen. No sizable effusion is noted. No focal infiltrate is seen. IMPRESSION: Increased vascular congestion consistent with CHF. Electronically Signed   By: Inez Catalina M.D.   On: 02/15/2020 01:55    EKG: Independently reviewed.  Sinus tachycardia.  Assessment/Plan Principal Problem:   Acute respiratory failure with hypoxia (HCC) Active Problems:   Morbid obesity (HCC)   CKD (chronic kidney disease) stage 2, GFR 60-89 ml/min   Diabetes  mellitus type II, controlled (HCC)   OSA (obstructive sleep apnea)   Acute on chronic combined systolic and diastolic CHF (congestive heart failure) (HCC)   Hypertensive urgency   CHF (congestive heart failure) ([place])    1. Acute respiratory failure with hypoxia secondary to acute on chronic combined systolic and diastolic CHF last EF measured in 2018 was 40 to 45% for which I have placed patient on IV Lasix 40 mg every 12 patient did receive 80 mg in the ER.  Closely follow intake output metabolic panel and daily weights.  On nitroglycerin infusion which may be slowly weaned off and patient takes his home antihypertensives. 2. Hypertensive urgency presently on nitroglycerin infusion.  Patient takes Coreg hydralazine.  Holding of lisinopril due to mildly worsening renal function.  Renal function is already worsening may restart patient's lisinopril.  Once patient takes his home antihypertensive medication may try to wean off the nitroglycerin. 3. Diabetes mellitus type 2 on sliding scale coverage. 4. Acute on chronic kidney disease stage II creatinine is mildly worsened.  Presently holding of lisinopril checking UA.  If creatinine does not worsen further may consider restarting lisinopril. 5. Hyperlipidemia on statin.  Since patient has acute respiratory failure with acute CHF with multiple comorbidities high risk for deterioration will need close monitoring and inpatient status.   DVT prophylaxis: Lovenox. Code Status: Full code. Family Communication: Discussed with patient. Disposition Plan: Home. Consults called: None. Admission status: Inpatient.   Rise Patience MD [facility] Pager [phone].  If 7PM-7AM, please contact night-coverage [email or web address] Password [facility]  02/15/2020, 6:22 AM

## 2020-02-15 NOTE — Progress Notes (Signed)
  Echocardiogram 2D Echocardiogram has been performed.  Matilde Bash 02/15/2020, 1:52 PM

## 2020-02-16 LAB — GLUCOSE, CAPILLARY
Glucose-Capillary: 104 mg/dL — ABNORMAL HIGH (ref 70–99)
Glucose-Capillary: 114 mg/dL — ABNORMAL HIGH (ref 70–99)
Glucose-Capillary: 130 mg/dL — ABNORMAL HIGH (ref 70–99)
Glucose-Capillary: 139 mg/dL — ABNORMAL HIGH (ref 70–99)
Glucose-Capillary: 140 mg/dL — ABNORMAL HIGH (ref 70–99)

## 2020-02-16 LAB — BASIC METABOLIC PANEL
Anion gap: 8 (ref 5–15)
BUN: 28 mg/dL — ABNORMAL HIGH (ref 6–20)
CO2: 24 mmol/L (ref 22–32)
Calcium: 8.8 mg/dL — ABNORMAL LOW (ref 8.9–10.3)
Chloride: 107 mmol/L (ref 98–111)
Creatinine, Ser: 1.96 mg/dL — ABNORMAL HIGH (ref 0.61–1.24)
GFR calc Af Amer: 44 mL/min — ABNORMAL LOW (ref >60)
GFR calc non Af Amer: 38 mL/min — ABNORMAL LOW (ref >60)
Glucose, Bld: 139 mg/dL — ABNORMAL HIGH (ref 70–99)
Potassium: 3.7 mmol/L (ref 3.5–5.1)
Sodium: 139 mmol/L (ref 135–145)

## 2020-02-16 MED ORDER — TRAMADOL HCL 50 MG PO TABS
50.0000 mg | ORAL_TABLET | Freq: Four times a day (QID) | ORAL | Status: DC | PRN
  Administered 2020-02-16: 50 mg via ORAL
  Filled 2020-02-16: qty 1

## 2020-02-16 MED ORDER — FUROSEMIDE 10 MG/ML IJ SOLN: 40.0000 mg | Freq: Two times a day (BID) | INTRAMUSCULAR | Status: DC

## 2020-02-16 NOTE — Progress Notes (Addendum)
Patient had a death in the family and needed to leave AMA. Offered my condolences and told him if did not feel well in the next couple of weeks to please come back. Weaned the IV nitro and discontinue drip. Patient signed AMA form, in chart.

## 2020-02-16 NOTE — Progress Notes (Signed)
PROGRESS NOTE    Matthew [name].  EX:[id number] DOB: Nov 25, 1966 DOA: 02/15/2020 PCP: Charlott Rakes, MD    Brief Narrative:  54 y.o. male with history of chronic systolic and diastolic CHF last EF measured in 2018 was 40 to 45% with history of chronic kidney disease stage II diabetes mellitus type 2, hypertension morbid obesity presents to the ER because of worsening shortness of breath since last night.  Patient states he has not been taking his Lasix for almost a month now since he has been urinating frequently.  He not sure if he has gained weight recently.  But about 4 5 days ago he was mildly short of breath which resolved without any intervention.  Last night he became acutely short of breath but had no chest pain fever chills or productive cough.  ED Course: In the ER patient blood pressure was 201/135 chest x-ray was consistent with CHF.  EKG shows sinus tachycardia.  Covid antigen test was negative but PCR is pending.  Patient was requiring BiPAP with 30% FiO2 initially.  Was given 80 mg IV Lasix and was placed on nitroglycerin infusion following which blood pressure improved and the space status improved.  Has been having good urine output so far.  Admitted for acute respiratory failure secondary to acute on chronic combined systolic and diastolic heart failure.  Labs reveal creatinine 1.6 increased from baseline of around 1.2 blood glucose 252 CBC unremarkable.  Assessment & Plan:   Principal Problem:   Acute respiratory failure with hypoxia (HCC) Active Problems:   Morbid obesity (HCC)   CKD (chronic kidney disease) stage 2, GFR 60-89 ml/min   Diabetes mellitus type II, controlled (HCC)   OSA (obstructive sleep apnea)   Acute on chronic combined systolic and diastolic CHF (congestive heart failure) (HCC)   Hypertensive urgency   CHF (congestive heart failure) ([place])  1. Acute respiratory failure with hypoxia secondary to acute on chronic combined systolic and diastolic  CHF  1. last EF measured in 2018 was 40 to 45% 2. Continued on scheduled IV lasix 3. Good urine output with 80mg  IV BID lasix, over 3.5L urine output overnight 4. Cr up to 1.96 this AM 5. Discussed with Pharmacy, see documentation. Recommendation to continue on lasix, albeit reduced dose at 40mg  IV bid 2. Hypertensive urgency  1. Had been on nitroglycerin infusion.   2. Patient takes Coreg hydralazine.  Holding of lisinopril due to mildly worsening renal function.   3. BP seems improved 3. Diabetes mellitus type 2  1. Will continue SSI coverage  2. Glucose trends remained stable 4. Acute on chronic kidney disease stage II  1. creatinine slightly higher at 1.9 2. Presently holding of lisinopril  3. Recheck bmet in AM 5. Hyperlipidemia  1. Continue on statin as pt tolerates  DVT prophylaxis: Lovenox subq Code Status: Full Family Communication: Pt in room, family not at bedside Disposition Plan: From home, home when fully diuresed and off IV lasix  Consultants:     Procedures:     Antimicrobials: Anti-infectives (From admission, onward)   None      Subjective: States feeling better. Eager to go home soon  Objective: Vitals:   02/16/20 0848 02/16/20 1348 02/16/20 1458 02/16/20 1736  BP: (!) 141/84 (!) 143/87 (!) 153/92 (!) 152/97  Pulse: 69 79  67  Resp:  20    Temp:  98.6 F (37 C)    TempSrc:  Oral    SpO2:  97%    Weight:  Height:        Intake/Output Summary (Last 24 hours) at 02/16/2020 1755 Last data filed at 02/16/2020 1514 Gross per 24 hour  Intake 1350 ml  Output 3075 ml  Net -1725 ml   Filed Weights   02/15/20 0650 02/16/20 0546  Weight: 124 kg 106.5 kg    Examination: General exam: Awake, laying in bed, in nad Respiratory system: Normal respiratory effort, no wheezing Cardiovascular system: regular rate, s1, s2 Gastrointestinal system: Soft, nondistended, positive BS Central nervous system: CN2-12 grossly intact, strength  intact Extremities: Perfused, no clubbing Skin: Normal skin turgor, no notable skin lesions seen Psychiatry: Mood normal // no visual hallucinations   Data Reviewed: I have personally reviewed following labs and imaging studies  CBC: Recent Labs  Lab 02/15/20 0142 02/15/20 0659  WBC 9.4 6.3  NEUTROABS  --  5.5  HGB 16.2 15.2  HCT 50.0 45.6  MCV 91.9 89.1  PLT 247 [id number]   Basic Metabolic Panel: Recent Labs  Lab 02/15/20 0142 02/15/20 0659 02/16/20 0345  NA 139 140 139  K 3.9 3.8 3.7  CL 108 106 107  CO2 16* 23 24  GLUCOSE 252* 169* 139*  BUN 18 18 28*  CREATININE 1.64* 1.61* 1.96*  CALCIUM 8.6* 8.9 8.8*  MG  --  2.2  --    GFR: Estimated Creatinine Clearance: 53.3 mL/min (A) (by C-G formula based on SCr of 1.96 mg/dL (H)). Liver Function Tests: Recent Labs  Lab 02/15/20 0659  AST 62*  ALT 86*  ALKPHOS 77  BILITOT 0.9  PROT 6.7  ALBUMIN 3.8   No results for input(s): LIPASE, AMYLASE in the last 168 hours. No results for input(s): AMMONIA in the last 168 hours. Coagulation Profile: No results for input(s): INR, PROTIME in the last 168 hours. Cardiac Enzymes: No results for input(s): CKTOTAL, CKMB, CKMBINDEX, TROPONINI in the last 168 hours. BNP (last 3 results) No results for input(s): PROBNP in the last 8760 hours. HbA1C: No results for input(s): HGBA1C in the last 72 hours. CBG: Recent Labs  Lab 02/15/20 1736 02/16/20 0004 02/16/20 0635 02/16/20 1059 02/16/20 1614  GLUCAP 159* 130* 114* 140* 104*   Lipid Profile: No results for input(s): CHOL, HDL, LDLCALC, TRIG, CHOLHDL, LDLDIRECT in the last 72 hours. Thyroid Function Tests: Recent Labs    02/15/20 0659  TSH 1.223   Anemia Panel: No results for input(s): VITAMINB12, FOLATE, FERRITIN, TIBC, IRON, RETICCTPCT in the last 72 hours. Sepsis Labs: No results for input(s): PROCALCITON, LATICACIDVEN in the last 168 hours.  No results found for this or any previous visit (from the past 240  hour(s)).   Radiology Studies: DG Chest Portable 1 View  Result Date: 02/15/2020 CLINICAL DATA:  Shortness of breath EXAM: PORTABLE CHEST 1 VIEW COMPARISON:  9/27/9 FINDINGS: Cardiac shadow is prominent but stable. Mild vascular congestion is seen. No sizable effusion is noted. No focal infiltrate is seen. IMPRESSION: Increased vascular congestion consistent with CHF. Electronically Signed   By: Inez Catalina M.D.   On: 02/15/2020 01:55   ECHOCARDIOGRAM COMPLETE  Result Date: 02/15/2020    ECHOCARDIOGRAM REPORT   Patient Name:   Matthew [name]. Date of Exam: 02/15/2020 Medical Rec #:  [id number]           Height:       70.0 in Accession #:    [id number]          Weight:       273.3 lb Date of Birth:  27-Mar-1966           BSA:          2.384 m Patient Age:    33 years            BP:           140/77 mmHg Patient Gender: M                   HR:           76 bpm. Exam Location:  Inpatient Procedure: 2D Echo, Cardiac Doppler and Color Doppler Indications:    CHF  History:        Patient has prior history of Echocardiogram examinations, most                 recent 02/04/2017. CHF and Cardiomyopathy,                 Signs/Symptoms:Shortness of Breath; Risk Factors:Diabetes,                 Hypertension, Current Smoker, Dyslipidemia and Obesity. CKD.  Sonographer:    Dustin Flock Referring Phys: [place]  1. Left ventricular ejection fraction, by estimation, is 40 to 45%. The left ventricle has mildly decreased function. The left ventricle has no regional wall motion abnormalities. There is moderate left ventricular hypertrophy. Left ventricular diastolic parameters were normal.  2. Right ventricular systolic function is normal. The right ventricular size is normal.  3. The mitral valve is normal in structure and function. Trivial mitral valve regurgitation. No evidence of mitral stenosis.  4. The aortic valve is normal in structure and function. Aortic valve regurgitation is not  visualized. No aortic stenosis is present. FINDINGS  Left Ventricle: Left ventricular ejection fraction, by estimation, is 40 to 45%. The left ventricle has mildly decreased function. The left ventricle has no regional wall motion abnormalities. The left ventricular internal cavity size was normal in size. There is moderate left ventricular hypertrophy. Left ventricular diastolic parameters were normal. Right Ventricle: The right ventricular size is normal. No increase in right ventricular wall thickness. Right ventricular systolic function is normal. Left Atrium: Left atrial size was normal in size. Right Atrium: Right atrial size was normal in size. Pericardium: There is no evidence of pericardial effusion. Mitral Valve: The mitral valve is normal in structure and function. Trivial mitral valve regurgitation. No evidence of mitral valve stenosis. Tricuspid Valve: The tricuspid valve is normal in structure. Tricuspid valve regurgitation is mild . No evidence of tricuspid stenosis. Aortic Valve: The aortic valve is normal in structure and function. Aortic valve regurgitation is not visualized. No aortic stenosis is present. Pulmonic Valve: The pulmonic valve was normal in structure. Pulmonic valve regurgitation is not visualized. No evidence of pulmonic stenosis. Aorta: The aortic root and ascending aorta are structurally normal, with no evidence of dilitation. IAS/Shunts: The atrial septum is grossly normal.  LEFT VENTRICLE PLAX 2D LVIDd:         6.34 cm  Diastology LVIDs:         4.99 cm  LV e' lateral:   5.87 cm/s LV PW:         1.31 cm  LV E/e' lateral: 7.8 LV IVS:        1.50 cm  LV e' medial:    3.37 cm/s LVOT diam:     2.80 cm  LV E/e' medial:  13.6 LV SV:  128 LV SV Index:   54 LVOT Area:     6.16 cm  RIGHT VENTRICLE             IVC RV Basal diam:  3.49 cm     IVC diam: 2.35 cm RV Mid diam:    3.15 cm RV S prime:     19.20 cm/s TAPSE (M-mode): 3.2 cm LEFT ATRIUM             Index       RIGHT ATRIUM            Index LA diam:        4.90 cm 2.06 cm/m  RA Area:     15.80 cm LA Vol (A2C):   84.1 ml 35.28 ml/m RA Volume:   38.80 ml  16.28 ml/m LA Vol (A4C):   58.2 ml 24.42 ml/m LA Biplane Vol: 74.0 ml 31.05 ml/m  AORTIC VALVE LVOT Vmax:   109.00 cm/s LVOT Vmean:  79.900 cm/s LVOT VTI:    0.208 m  AORTA Ao Root diam: 2.90 cm Ao Asc diam:  3.80 cm MITRAL VALVE MV Area (PHT): 3.39 cm    SHUNTS MV Decel Time: 224 msec    Systemic VTI:  0.21 m MV E velocity: 45.90 cm/s  Systemic Diam: 2.80 cm MV A velocity: 44.70 cm/s MV E/A ratio:  1.03 Mertie Moores MD Electronically signed by Mertie Moores MD Signature Date/Time: 02/15/2020/4:01:09 PM    Final     Scheduled Meds: . atorvastatin  80 mg Oral Daily  . carvedilol  12.5 mg Oral BID WC  . enoxaparin (LOVENOX) injection  40 mg Subcutaneous Daily  . ezetimibe  10 mg Oral Daily  . FLUoxetine  20 mg Oral Daily  . [START ON 02/17/2020] furosemide  40 mg Intravenous BID  . hydrALAZINE  50 mg Oral Q8H  . insulin aspart  0-9 Units Subcutaneous TID WC  . latanoprost  1 drop Both Eyes QHS  . timolol  1 drop Both Eyes Daily   Continuous Infusions: . nitroGLYCERIN 16.667 mcg/min (02/16/20 1514)     LOS: 1 day   Marylu Lund, MD [facility] Pager On Amion  If 7PM-7AM, please contact night-coverage 02/16/2020, 5:55 PM

## 2020-02-16 NOTE — Progress Notes (Signed)
I responded to page for pastoral support to Matthew [name]. He was in the process of being discharged. Nurse stated he just received news of a death in the family. He was emotional upon entering room. I offered him space to grieve. He said he was in shock and in planning to get ride to his home. I offered emotional support and ministry of presence.   Chaplain Resident Fidel Levy [phone]

## 2020-02-16 NOTE — Care Management (Signed)
02-16-20 1553 Copy of insurance card sent to admitting to place in [place]. Benefits check submitted for [employer]. Case Manager will follow for cost of medications. Bethena Roys, RN,BSN Case Manager

## 2020-02-16 NOTE — Progress Notes (Signed)
  ReDS Clip Diuretic Study Pt study # [id number]  Your patient has been enrolled in the ReDS Clip Diuretic Study   Changes to prescribed diuretics recommended:  Consider decrease furosemide to 40mg  BID or hold x1 dose today - with bump in Cr and great uop yesterday -3.5L  Provider contacted: Dr Earlie Counts Recommendation    REDS Clip  READING= 42  CHEST RULER =34 Clip Station = D   Orthodema score =  Signs/Symptoms Score   Mild edema, no orthopnea 0 No congestion  Moderate edema, no orthopnea 1 Low-grade orthodema/congestion  Severe edema OR orthopnea 2   Moderate edema and orthopnea 3 High-grade orthodema/congestion  Severe edema AND orthopnea 4     Bonnita Nasuti Pharm.D. CPP, BCPS Clinical Pharmacist [phone] 02/16/2020 8:12 AM

## 2020-02-17 ENCOUNTER — Telehealth: Payer: Self-pay

## 2020-02-17 NOTE — Telephone Encounter (Signed)
Transition Care Management Follow-up Telephone Call Date of discharge and from where: 3/2/21021, [facility] - left AMA due to death in the family.   Call placed to patient # [phone], message left with call back requested to this CM

## 2020-02-17 NOTE — Care Management (Signed)
Pt D/C

## 2020-02-18 ENCOUNTER — Telehealth: Payer: Self-pay

## 2020-02-18 NOTE — Telephone Encounter (Signed)
From the discharge call:  The patient left the hospital 02/15/2209  AMA because he had received a call that his wife had died  He said that he is home with family at this time.  The funeral is in [place] on Saturday - 02/20/2020.  He hopes to be back in [place] by Monday 02/22/2020 and wanted to schedule an appointment with Dr Margarita Rana  He said that he is just tired and trying to relax.  Condolences offered.  He wanted an appt with Dr Margarita Rana next week - 02/23/2020 He said that he has all of the medications he was taking prior to his hospitalization  .   He said that he is hoping to return to work as soon as he can.  His boss is aware of his current situation with the recent family death and his hospitalization and told him that he can take off as much time as he needs.

## 2020-02-18 NOTE — Telephone Encounter (Signed)
Transition Care Management Follow-up Telephone Call  Date of discharge and from where: 02/16/2020, [facility].   Left AMA due to death in his family  How have you been since you were released from the hospital? He explained that he left the hospital because he had received a call that his wife had died . He said that he is home with family at this time.  The funeral is in [place] on Saturday - 02/20/2020.  He hopes to be back in [place] by Monday 02/22/2020 and wanted to schedule an appointment with Dr Margarita Rana  He said that he is just tired and trying to relax.  Condolences offered,  .    Any questions or concerns? He said that he is hoping to return to work as soon as he can.  His boss is aware of his current situation with the recent family death and his hospitalization and told him that he can take off as much time as he needs.   Items Reviewed:  Did the pt receive and understand the discharge instructions provided?  he did not receive any instructions as he left AMA  Medications obtained and verified? no new medications ordered. He said that he has everything that he was taking prior to his hospitalization.   Any new allergies since your discharge?   None reported  Do you have support at home? yes, he said that he has family with him  Other (ie: DME, Home Health, etc) no home health ordered.   He said that he has a glucometer   Functional Status:   independent   Follow up appointments reviewed:    PCP Hospital f/u appt confirmed? appointment scheduled for 02/23/2019 @ [facility] f/u appt confirmed? .will need to schedule appointment with cardiology  If their condition worsens, is the pt aware to call  their PCP or go to the ED?  yes  Was the patient provided with contact information for the PCP's office or ED?he has the phone number for the clinic  Was the pt encouraged to call back with questions or concerns?  yes

## 2020-02-19 NOTE — Telephone Encounter (Signed)
Thank you for the update.  I will see him at his upcoming visit.

## 2020-02-23 ENCOUNTER — Other Ambulatory Visit: Payer: Self-pay

## 2020-02-23 ENCOUNTER — Encounter: Payer: Self-pay | Admitting: Family Medicine

## 2020-02-23 ENCOUNTER — Ambulatory Visit: Payer: Self-pay

## 2020-02-23 ENCOUNTER — Ambulatory Visit: Payer: Self-pay | Attending: Family Medicine | Admitting: Family Medicine

## 2020-02-23 DIAGNOSIS — E1121 Type 2 diabetes mellitus with diabetic nephropathy: Secondary | ICD-10-CM

## 2020-02-23 DIAGNOSIS — E782 Mixed hyperlipidemia: Secondary | ICD-10-CM

## 2020-02-23 DIAGNOSIS — Z634 Disappearance and death of family member: Secondary | ICD-10-CM

## 2020-02-23 DIAGNOSIS — I5043 Acute on chronic combined systolic (congestive) and diastolic (congestive) heart failure: Secondary | ICD-10-CM

## 2020-02-23 DIAGNOSIS — I110 Hypertensive heart disease with heart failure: Secondary | ICD-10-CM

## 2020-02-23 NOTE — Progress Notes (Signed)
Virtual Visit via Telephone Note  I connected with Matthew [name]., on 02/23/2020 at 11:12 AM by telephone due to the COVID-19 pandemic and verified that I am speaking with the correct person using two identifiers.   Consent: I discussed the limitations, risks, security and privacy concerns of performing an evaluation and management service by telephone and the availability of in person appointments. I also discussed with the patient that there may be a patient responsible charge related to this service. The patient expressed understanding and agreed to proceed.   Location of Patient: Home  Location of Provider: Clinic   Persons participating in Telemedicine visit: Antolin Vannorman. Ozzie Hoyle Dr. Margarita Rana     History of Present Illness: Matthew [name]. 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  Hospitalized for acute respiratory failure secondary to CHF exacerbation at [facility] from 02/15/2020 through 02/16/2020.  He had stopped Lasix because it was causing him to be urinate a lot and on presentation was found to be in hypertensive urgency with blood pressure of 201/135. He was treated with BiPAP, IV Lasix and labs revealed acute kidney injury with a trending up of creatinine of 1.2 up to about 1.96 and lisinopril was placed on hold.  Unfortunately during his hospital stay he received news that his wife had passed which led him to leave AMA.   His wife's funeral was 3 days ago in [place] and he informs me he forgot all his medications and felt well and would need refills today.  He would also need to drop off a form from work for request for time  off. He has been taking Furosemide once daily and he feels great. His weight is 230.6lbs at the moment.  Past Medical History:  Diagnosis Date  . Acute combined systolic and diastolic CHF, NYHA class 3 ([place])    a. 02/2014 Echo: EF 25-30%.  . Cardiomyopathy ([place])    a. 02/2014 Echo: EF 25-30%, sev glob HK with inferolat HK->AK, mod conc LVH, Gr 2 DD, Mild MR, sev dil LA.  . Depression   . DM (diabetes mellitus) ([place])   . High cholesterol   . Hypertension   . Morbid obesity ([place])   . Tobacco abuse    No Known Allergies  Current Outpatient Medications on File Prior to Visit  Medication Sig Dispense Refill  . atorvastatin (LIPITOR) 80 MG tablet Take 1 tablet (80 mg total) by mouth daily. 30 tablet 6  . carvedilol (COREG) 12.5 MG tablet Take 1 tablet (12.5 mg total) by mouth 2 (two) times daily with a meal. Must have office visit for refills 60 tablet 6  . ezetimibe (ZETIA) 10 MG tablet Take 1 tablet (10 mg total) by mouth daily. 30 tablet 6  . FLUoxetine (PROZAC) 20 MG tablet Take 1 tablet (20 mg total) by mouth daily. 30 tablet 6  . furosemide (LASIX) 40 MG tablet Take 1 tablet (40 mg total) by mouth daily. 30 tablet 6  . glipiZIDE (GLUCOTROL) 5 MG tablet Take 1 tablet (5 mg total) by mouth 2 (two) times daily before a meal. Must have office visit for refills 60 tablet 6  . hydrALAZINE (APRESOLINE) 50 MG tablet Take  1 tablet (50 mg total) by mouth 3 (three) times daily. 90 tablet 6  . latanoprost (XALATAN) 0.005 % ophthalmic solution Place 1 drop into both eyes at bedtime.    Marland Kitchen lisinopril (ZESTRIL) 5 MG tablet Take 1 tablet (5 mg total) by mouth daily. 30 tablet 6  . acetaminophen (TYLENOL) 500 MG tablet Take 500 mg by mouth daily.     Marland Kitchen aspirin 81 MG tablet Take 1 tablet (81 mg total) by mouth daily. (Patient not taking: Reported on 11/18/2018) 30 tablet 6  . glucose blood (ONE TOUCH ULTRA TEST) test strip Use as instructed (Patient not taking: Reported on 02/19/2019) 30 each 12  . ibuprofen  (ADVIL,MOTRIN) 800 MG tablet Take 1 tablet (800 mg total) by mouth every 6 (six) hours as needed. (Patient not taking: Reported on 02/19/2019) 30 tablet 0  . [DISCONTINUED] lovastatin (MEVACOR) 20 MG tablet Take 2 tablets (40 mg total) by mouth at bedtime. 60 tablet 1   No current facility-administered medications on file prior to visit.    Observations/Objective: Awake, alert, oriented x3 Not in acute distress  Lab Results  Component Value Date   HGBA1C 6.4 12/02/2019    CMP Latest Ref Rng & Units 02/16/2020 02/15/2020 02/15/2020  Glucose 70 - 99 mg/dL 139(H) 169(H) 252(H)  BUN 6 - 20 mg/dL 28(H) 18 18  Creatinine 0.61 - 1.24 mg/dL 1.96(H) 1.61(H) 1.64(H)  Sodium 135 - 145 mmol/L 139 140 139  Potassium 3.5 - 5.1 mmol/L 3.7 3.8 3.9  Chloride 98 - 111 mmol/L 107 106 108  CO2 22 - 32 mmol/L 24 23 16(L)  Calcium 8.9 - 10.3 mg/dL 8.8(L) 8.9 8.6(L)  Total Protein 6.5 - 8.1 g/dL - 6.7 -  Total Bilirubin 0.3 - 1.2 mg/dL - 0.9 -  Alkaline Phos 38 - 126 U/L - 77 -  AST 15 - 41 U/L - 62(H) -  ALT 0 - 44 U/L - 86(H) -     Lipid Panel     Component Value Date/Time   CHOL 144 03/07/2018 0834   TRIG 175 (H) 03/07/2018 0834   HDL 36 (L) 03/07/2018 0834   CHOLHDL 4.0 03/07/2018 0834   CHOLHDL 5.3 02/01/2017 0409   VLDL UNABLE TO CALCULATE IF TRIGLYCERIDE OVER 400 mg/dL 02/01/2017 0409   LDLCALC 73 03/07/2018 0834   LABVLDL 35 03/07/2018 0834    Assessment and Plan: 1. Mixed hyperlipidemia Controlled Continue statin I will have the pharmacy get his refills together so he can pick them up  2. Controlled type 2 diabetes mellitus with diabetic nephropathy, without long-term current use of insulin (HCC) Controlled with A1c of 6.4 He will come into the office for an A1c level and also a basic metabolic panel to check his creatinine - Basic Metabolic Panel - Hemoglobin A1c  3.  Hypertensive heart disease with acute on chronic combined systolic and diastolic congestive heart failure  (HCC) EF 40-45% Euvolemic at this time Encouraged to comply with Lasix Also to keep a log of daily weight checks and notify the clinic of weight gain of greater than 5 pounds in 1 week or greater than 3 pounds in 1 day Compliant with heart healthy diet We will check creatinine level  4. Bereavement He will benefit from grief counseling I will have the LCSW reach out to him for this.   Follow Up Instructions: Return for Labs, later today; Jasmine next available-grief counseling; PCP 3 months.    I discussed the assessment and treatment plan with the patient.  The patient was provided an opportunity to ask questions and all were answered. The patient agreed with the plan and demonstrated an understanding of the instructions.   The patient was advised to call back or seek an in-person evaluation if the symptoms worsen or if the condition fails to improve as anticipated.     I provided 17 minutes total of non-face-to-face time during this encounter including median intraservice time, reviewing previous notes, investigations, ordering medications, medical decision making, coordinating care and patient verbalized understanding at the end of the visit.     Charlott Rakes, MD, FAAFP. [facility] and [facility], [place]   02/23/2020, 11:12 AM

## 2020-02-23 NOTE — Progress Notes (Signed)
Patient has been called and DOB has been verified. Patient has been screened and transferred to PCP to start phone visit.  Needs refills on medications.

## 2020-02-24 LAB — BASIC METABOLIC PANEL
BUN/Creatinine Ratio: 8 — ABNORMAL LOW (ref 9–20)
BUN: 13 mg/dL (ref 6–24)
CO2: 23 mmol/L (ref 20–29)
Calcium: 9 mg/dL (ref 8.7–10.2)
Chloride: 107 mmol/L — ABNORMAL HIGH (ref 96–106)
Creatinine, Ser: 1.68 mg/dL — ABNORMAL HIGH (ref 0.76–1.27)
GFR calc Af Amer: 53 mL/min/{1.73_m2} — ABNORMAL LOW (ref >59)
GFR calc non Af Amer: 46 mL/min/{1.73_m2} — ABNORMAL LOW (ref >59)
Glucose: 119 mg/dL — ABNORMAL HIGH (ref 65–99)
Potassium: 4 mmol/L (ref 3.5–5.2)
Sodium: 146 mmol/L — ABNORMAL HIGH (ref 134–144)

## 2020-02-24 LAB — HEMOGLOBIN A1C
Est. average glucose Bld gHb Est-mCnc: 134 mg/dL
Hgb A1c MFr Bld: 6.3 % — ABNORMAL HIGH (ref 4.8–5.6)

## 2020-02-24 NOTE — Discharge Summary (Signed)
Physician Discharge Summary  Matthew [name]. [id number] DOB: [birth year] DOA: 02/15/2020  PCP: Charlott Rakes, MD  Admit date: 02/15/2020 Discharge date: 02/16/2020  Patient Left Against Medical Advise   Brief/Interim Summary: 54 y.o.malewithhistory of chronic systolic and diastolic CHF last EF measured in 2018 was 40 to 45% with history of chronic kidney disease stage II diabetes mellitus type 2, hypertension morbid obesity presents to the ER because of worsening shortness of breath since last night. Patient states he has not been taking his Lasix for almost a month now since he has been urinating frequently. He not sure if he has gained weight recently. But about 4 5 days ago he was mildly short of breath which resolved without any intervention. Last night he became acutely short of breath but had no chest pain fever chills or productive cough.  ED Course:In the ER patient blood pressure was 201/135 chest x-ray was consistent with CHF. EKG shows sinus tachycardia. Covid antigen test was negative but PCR is pending. Patient was requiring BiPAP with 30% FiO2 initially. Was given 80 mg IV Lasix and was placed on nitroglycerin infusion following which blood pressure improved and the space status improved. Has been having good urine output so far. Admitted for acute respiratory failure secondary to acute on chronic combined systolic and diastolic heart failure. Labs reveal creatinine 1.6 increased from baseline of around 1.2 blood glucose 252 CBC unremarkable.  Discharge Diagnoses:  Principal Problem:   Acute respiratory failure with hypoxia (HCC) Active Problems:   Morbid obesity (HCC)   CKD (chronic kidney disease) stage 2, GFR 60-89 ml/min   Diabetes mellitus type II, controlled (HCC)   OSA (obstructive sleep apnea)   Acute on chronic combined systolic and diastolic CHF (congestive heart failure) (HCC)   Hypertensive urgency   CHF (congestive heart failure)  ([place])  1. Acute respiratory failure with hypoxia secondary to acute on chronic combined systolic and diastolic CHF  1. last EF measured in 2018 was 40 to 45% 2. Continued on scheduled IV lasix while in hospital 3. Good urine output with 80mg  IV BID lasix, over 3.5L urine output 4. Cr peaked to 1.96  5. Discussed with Pharmacy, see documentation. Recommendation to continue on lasix, albeit reduced dose at 40mg  IV bid 6. On the evening of 02/16/20, patient elected to leave against medical advise. Pt aware of risk of decompensation and death if leaving but still elected to leave 2. Hypertensive urgency  1. Had been on nitroglycerin infusion.  2. Patient takes Coreg hydralazine. Held lisinopril due to mildly worsening renal function.  3. BP seems improved 3. Diabetes mellitus type 2  1. Continued SSI coverage while in hospital 2. Glucose trends remained stable 4. Acute on chronic kidney disease stage II  1. Held lisinopril  5. Hyperlipidemia  1. Continued on statin as pt tolerates  Discharge Instructions   Allergies as of 02/16/2020   No Known Allergies     Medication List    ASK your doctor about these medications   acetaminophen 500 MG tablet Commonly known as: TYLENOL Take 500 mg by mouth daily.   aspirin 81 MG tablet Take 1 tablet (81 mg total) by mouth daily.   atorvastatin 80 MG tablet Commonly known as: LIPITOR Take 1 tablet (80 mg total) by mouth daily.   carvedilol 12.5 MG tablet Commonly known as: COREG Take 1 tablet (12.5 mg total) by mouth 2 (two) times daily with a meal. Must have office visit for refills   ezetimibe 10  MG tablet Commonly known as: ZETIA Take 1 tablet (10 mg total) by mouth daily.   FLUoxetine 20 MG tablet Commonly known as: PROZAC Take 1 tablet (20 mg total) by mouth daily.   furosemide 40 MG tablet Commonly known as: LASIX Take 1 tablet (40 mg total) by mouth daily.   glipiZIDE 5 MG tablet Commonly known as: GLUCOTROL Take 1  tablet (5 mg total) by mouth 2 (two) times daily before a meal. Must have office visit for refills   glucose blood test strip Commonly known as: ONE TOUCH ULTRA TEST Use as instructed   hydrALAZINE 50 MG tablet Commonly known as: APRESOLINE Take 1 tablet (50 mg total) by mouth 3 (three) times daily.   ibuprofen 800 MG tablet Commonly known as: ADVIL Take 1 tablet (800 mg total) by mouth every 6 (six) hours as needed.   latanoprost 0.005 % ophthalmic solution Commonly known as: XALATAN Place 1 drop into both eyes at bedtime.   lisinopril 5 MG tablet Commonly known as: ZESTRIL Take 1 tablet (5 mg total) by mouth daily.       No Known Allergies    Procedures/Studies: DG Chest Portable 1 View  Result Date: 02/15/2020 CLINICAL DATA:  Shortness of breath EXAM: PORTABLE CHEST 1 VIEW COMPARISON:  9/27/9 FINDINGS: Cardiac shadow is prominent but stable. Mild vascular congestion is seen. No sizable effusion is noted. No focal infiltrate is seen. IMPRESSION: Increased vascular congestion consistent with CHF. Electronically Signed   By: Inez Catalina M.D.   On: 02/15/2020 01:55   ECHOCARDIOGRAM COMPLETE  Result Date: 02/15/2020    ECHOCARDIOGRAM REPORT   Patient Name:   Matthew [name]. Date of Exam: 02/15/2020 Medical Rec #:  [id number]           Height:       70.0 in Accession #:    [id number]          Weight:       273.3 lb Date of Birth:  1966-10-21           BSA:          2.384 m Patient Age:    54 years            BP:           140/77 mmHg Patient Gender: M                   HR:           76 bpm. Exam Location:  Inpatient Procedure: 2D Echo, Cardiac Doppler and Color Doppler Indications:    CHF  History:        Patient has prior history of Echocardiogram examinations, most                 recent 02/04/2017. CHF and Cardiomyopathy,                 Signs/Symptoms:Shortness of Breath; Risk Factors:Diabetes,                 Hypertension, Current Smoker, Dyslipidemia and Obesity. CKD.   Sonographer:    Dustin Flock Referring Phys: [place]  1. Left ventricular ejection fraction, by estimation, is 40 to 45%. The left ventricle has mildly decreased function. The left ventricle has no regional wall motion abnormalities. There is moderate left ventricular hypertrophy. Left ventricular diastolic parameters were normal.  2. Right ventricular systolic function is normal. The right ventricular size is normal.  3. The mitral valve is normal in structure and function. Trivial mitral valve regurgitation. No evidence of mitral stenosis.  4. The aortic valve is normal in structure and function. Aortic valve regurgitation is not visualized. No aortic stenosis is present. FINDINGS  Left Ventricle: Left ventricular ejection fraction, by estimation, is 40 to 45%. The left ventricle has mildly decreased function. The left ventricle has no regional wall motion abnormalities. The left ventricular internal cavity size was normal in size. There is moderate left ventricular hypertrophy. Left ventricular diastolic parameters were normal. Right Ventricle: The right ventricular size is normal. No increase in right ventricular wall thickness. Right ventricular systolic function is normal. Left Atrium: Left atrial size was normal in size. Right Atrium: Right atrial size was normal in size. Pericardium: There is no evidence of pericardial effusion. Mitral Valve: The mitral valve is normal in structure and function. Trivial mitral valve regurgitation. No evidence of mitral valve stenosis. Tricuspid Valve: The tricuspid valve is normal in structure. Tricuspid valve regurgitation is mild . No evidence of tricuspid stenosis. Aortic Valve: The aortic valve is normal in structure and function. Aortic valve regurgitation is not visualized. No aortic stenosis is present. Pulmonic Valve: The pulmonic valve was normal in structure. Pulmonic valve regurgitation is not visualized. No evidence of pulmonic  stenosis. Aorta: The aortic root and ascending aorta are structurally normal, with no evidence of dilitation. IAS/Shunts: The atrial septum is grossly normal.  LEFT VENTRICLE PLAX 2D LVIDd:         6.34 cm  Diastology LVIDs:         4.99 cm  LV e' lateral:   5.87 cm/s LV PW:         1.31 cm  LV E/e' lateral: 7.8 LV IVS:        1.50 cm  LV e' medial:    3.37 cm/s LVOT diam:     2.80 cm  LV E/e' medial:  13.6 LV SV:         128 LV SV Index:   54 LVOT Area:     6.16 cm  RIGHT VENTRICLE             IVC RV Basal diam:  3.49 cm     IVC diam: 2.35 cm RV Mid diam:    3.15 cm RV S prime:     19.20 cm/s TAPSE (M-mode): 3.2 cm LEFT ATRIUM             Index       RIGHT ATRIUM           Index LA diam:        4.90 cm 2.06 cm/m  RA Area:     15.80 cm LA Vol (A2C):   84.1 ml 35.28 ml/m RA Volume:   38.80 ml  16.28 ml/m LA Vol (A4C):   58.2 ml 24.42 ml/m LA Biplane Vol: 74.0 ml 31.05 ml/m  AORTIC VALVE LVOT Vmax:   109.00 cm/s LVOT Vmean:  79.900 cm/s LVOT VTI:    0.208 m  AORTA Ao Root diam: 2.90 cm Ao Asc diam:  3.80 cm MITRAL VALVE MV Area (PHT): 3.39 cm    SHUNTS MV Decel Time: 224 msec    Systemic VTI:  0.21 m MV E velocity: 45.90 cm/s  Systemic Diam: 2.80 cm MV A velocity: 44.70 cm/s MV E/A ratio:  1.03 Mertie Moores MD Electronically signed by Mertie Moores MD Signature Date/Time: 02/15/2020/4:01:09 PM    Final  Discharge Exam: Vitals:   02/16/20 1736 02/16/20 2046  BP: (!) 152/97 (!) 141/86  Pulse: 67 67  Resp:  19  Temp:  97.8 F (36.6 C)  SpO2:  99%   Vitals:   02/16/20 1348 02/16/20 1458 02/16/20 1736 02/16/20 2046  BP: (!) 143/87 (!) 153/92 (!) 152/97 (!) 141/86  Pulse: 79  67 67  Resp: 20   19  Temp: 98.6 F (37 C)   97.8 F (36.6 C)  TempSrc: Oral   Oral  SpO2: 97%   99%  Weight:      Height:        The results of significant diagnostics from this hospitalization (including imaging, microbiology, ancillary and laboratory) are listed below for reference.     Microbiology: No  results found for this or any previous visit (from the past 240 hour(s)).   Labs: BNP (last 3 results) Recent Labs    02/15/20 0142  BNP 123XX123*   Basic Metabolic Panel: Recent Labs  Lab 02/23/20 1422  NA 146*  K 4.0  CL 107*  CO2 23  GLUCOSE 119*  BUN 13  CREATININE 1.68*  CALCIUM 9.0   Liver Function Tests: No results for input(s): AST, ALT, ALKPHOS, BILITOT, PROT, ALBUMIN in the last 168 hours. No results for input(s): LIPASE, AMYLASE in the last 168 hours. No results for input(s): AMMONIA in the last 168 hours. CBC: No results for input(s): WBC, NEUTROABS, HGB, HCT, MCV, PLT in the last 168 hours. Cardiac Enzymes: No results for input(s): CKTOTAL, CKMB, CKMBINDEX, TROPONINI in the last 168 hours. BNP: Invalid input(s): POCBNP CBG: No results for input(s): GLUCAP in the last 168 hours. D-Dimer No results for input(s): DDIMER in the last 72 hours. Hgb A1c Recent Labs    02/23/20 1422  HGBA1C 6.3*   Lipid Profile No results for input(s): CHOL, HDL, LDLCALC, TRIG, CHOLHDL, LDLDIRECT in the last 72 hours. Thyroid function studies No results for input(s): TSH, T4TOTAL, T3FREE, THYROIDAB in the last 72 hours.  Invalid input(s): FREET3 Anemia work up No results for input(s): VITAMINB12, FOLATE, FERRITIN, TIBC, IRON, RETICCTPCT in the last 72 hours. Urinalysis    Component Value Date/Time   COLORURINE STRAW (A) 02/15/2020 0946   APPEARANCEUR CLEAR 02/15/2020 0946   LABSPEC 1.008 02/15/2020 0946   PHURINE 5.0 02/15/2020 0946   GLUCOSEU NEGATIVE 02/15/2020 0946   HGBUR NEGATIVE 02/15/2020 0946   BILIRUBINUR NEGATIVE 02/15/2020 0946   KETONESUR NEGATIVE 02/15/2020 0946   PROTEINUR NEGATIVE 02/15/2020 0946   NITRITE NEGATIVE 02/15/2020 0946   LEUKOCYTESUR NEGATIVE 02/15/2020 0946   Sepsis Labs Invalid input(s): PROCALCITONIN,  WBC,  LACTICIDVEN Microbiology No results found for this or any previous visit (from the past 240 hour(s)).   SIGNED:   Marylu Lund, MD  [facility] 02/24/2020, 8:26 AM  If 7PM-7AM, please contact night-coverage

## 2020-02-26 ENCOUNTER — Telehealth: Payer: Self-pay

## 2020-02-26 NOTE — Telephone Encounter (Signed)
-----   Message from Charlott Rakes, MD sent at 02/25/2020  2:19 PM EST ----- Please inform him A1c is normal at 6.3, other labs are stable

## 2020-02-26 NOTE — Telephone Encounter (Signed)
Patient was called and he has a voicemail that is not set up to leave a voicemail.

## 2020-02-29 ENCOUNTER — Telehealth: Payer: Self-pay | Admitting: Licensed Clinical Social Worker

## 2020-02-29 ENCOUNTER — Ambulatory Visit: Payer: Self-pay | Attending: Family Medicine | Admitting: Licensed Clinical Social Worker

## 2020-02-29 ENCOUNTER — Other Ambulatory Visit: Payer: Self-pay

## 2020-02-29 NOTE — Telephone Encounter (Signed)
Call placed to patient regarding scheduled [facility] appointment. LCSW left message requesting a return call.  

## 2020-03-14 ENCOUNTER — Ambulatory Visit: Payer: Self-pay | Attending: Internal Medicine

## 2020-03-14 DIAGNOSIS — Z23 Encounter for immunization: Secondary | ICD-10-CM

## 2020-03-14 NOTE — Progress Notes (Signed)
   [id number]  Name:  Bradey Southards.    MRN: [id number] DOB: [birth year]  03/14/2020  Mr. Dickow was observed post Covid-19 immunization for 15 minutes without incident. He was provided with Vaccine Information Sheet and instruction to access the V-Safe system.   Mr. Sarni was instructed to call 911 with any severe reactions post vaccine: Marland Kitchen Difficulty breathing  . Swelling of face and throat  . A fast heartbeat  . A bad rash all over body  . Dizziness and weakness   Immunizations Administered    Name Date Dose VIS Date Route   Pfizer COVID-19 Vaccine 03/14/2020 12:21 PM 0.3 mL 11/27/2019 Intramuscular   Manufacturer: [place]   Lot: [id number]   [place]: [id number]

## 2020-04-01 MED FILL — glipiZIDE 5 MG TABS: 5 | 30 days supply | Qty: 60 | Fill #2

## 2020-04-01 MED FILL — FUROSEMIDE 40 MG TAB: 40 | 30 days supply | Qty: 30 | Fill #1

## 2020-04-01 MED FILL — CARVEDILOL 12.5 MG TABLET: 12.5 | 30 days supply | Qty: 60 | Fill #2

## 2020-04-01 MED FILL — ATORVASTATIN 80 MG TABLET: 80 | 30 days supply | Qty: 30 | Fill #1

## 2020-04-01 MED FILL — LISINOPRIL 5 MG TABLET: 5 | 30 days supply | Qty: 30 | Fill #1

## 2020-04-01 MED FILL — hydrALAZINE HCL 50 MG TABS: 50 | 30 days supply | Qty: 90 | Fill #1

## 2020-04-05 ENCOUNTER — Ambulatory Visit: Payer: Self-pay | Attending: Internal Medicine

## 2020-04-05 DIAGNOSIS — Z23 Encounter for immunization: Secondary | ICD-10-CM

## 2020-04-05 NOTE — Progress Notes (Signed)
   [id number]  Name:  Matthew [name].    MRN: [id number] DOB: 1966-04-30  04/05/2020  Matthew [name] was observed post Covid-19 immunization for 15 minutes without incident. He was provided with Vaccine Information Sheet and instruction to access the V-Safe system.   Matthew [name] was instructed to call 911 with any severe reactions post vaccine: Marland Kitchen Difficulty breathing  . Swelling of face and throat  . A fast heartbeat  . A bad rash all over body  . Dizziness and weakness   Immunizations Administered    Name Date Dose VIS Date Route   Pfizer COVID-19 Vaccine 04/05/2020  1:06 PM 0.3 mL 02/10/2019 Intramuscular   Manufacturer: [place]   Lot: [id number]   [place]: [id number]

## 2020-06-21 MED FILL — glipiZIDE 5 MG TABS: 5 | 30 days supply | Qty: 60 | Fill #3

## 2020-06-21 MED FILL — LISINOPRIL 5 MG TABLET: 5 | 30 days supply | Qty: 30 | Fill #2

## 2020-06-21 MED FILL — TIMOLOL 0.5% EYE DROPS: 0.5 | 37 days supply | Qty: 5 | Fill #2

## 2020-06-21 MED FILL — FUROSEMIDE 40 MG TAB: 40 | 30 days supply | Qty: 30 | Fill #2

## 2020-06-21 MED FILL — hydrALAZINE HCL 50 MG TABS: 50 | 30 days supply | Qty: 90 | Fill #2

## 2020-06-21 MED FILL — CARVEDILOL 12.5 MG TABLET: 12.5 | 30 days supply | Qty: 60 | Fill #3

## 2020-06-21 MED FILL — ATORVASTATIN 80 MG TABLET: 80 | 30 days supply | Qty: 30 | Fill #2

## 2020-07-04 ENCOUNTER — Other Ambulatory Visit: Payer: Self-pay

## 2020-07-04 ENCOUNTER — Inpatient Hospital Stay (HOSPITAL_COMMUNITY)
Admission: EM | Admit: 2020-07-04 | Discharge: 2020-07-06 | DRG: 291 | Disposition: A | Discharge status: Home / Self Care | Payer: Self-pay | Attending: Family Medicine | Admitting: Family Medicine

## 2020-07-04 ENCOUNTER — Emergency Department (HOSPITAL_COMMUNITY): Payer: Self-pay

## 2020-07-04 ENCOUNTER — Encounter (HOSPITAL_COMMUNITY): Payer: Self-pay | Admitting: Internal Medicine

## 2020-07-04 DIAGNOSIS — E119 Type 2 diabetes mellitus without complications: Secondary | ICD-10-CM

## 2020-07-04 DIAGNOSIS — Z8049 Family history of malignant neoplasm of other genital organs: Secondary | ICD-10-CM

## 2020-07-04 DIAGNOSIS — E782 Mixed hyperlipidemia: Secondary | ICD-10-CM | POA: Diagnosis present

## 2020-07-04 DIAGNOSIS — Z6835 Body mass index (BMI) 35.0-35.9, adult: Secondary | ICD-10-CM

## 2020-07-04 DIAGNOSIS — E1122 Type 2 diabetes mellitus with diabetic chronic kidney disease: Secondary | ICD-10-CM | POA: Diagnosis present

## 2020-07-04 DIAGNOSIS — G4733 Obstructive sleep apnea (adult) (pediatric): Secondary | ICD-10-CM | POA: Diagnosis present

## 2020-07-04 DIAGNOSIS — I429 Cardiomyopathy, unspecified: Secondary | ICD-10-CM | POA: Diagnosis present

## 2020-07-04 DIAGNOSIS — J9601 Acute respiratory failure with hypoxia: Secondary | ICD-10-CM | POA: Diagnosis present

## 2020-07-04 DIAGNOSIS — Z87891 Personal history of nicotine dependence: Secondary | ICD-10-CM

## 2020-07-04 DIAGNOSIS — F17210 Nicotine dependence, cigarettes, uncomplicated: Secondary | ICD-10-CM | POA: Diagnosis present

## 2020-07-04 DIAGNOSIS — Z20822 Contact with and (suspected) exposure to covid-19: Secondary | ICD-10-CM | POA: Diagnosis present

## 2020-07-04 DIAGNOSIS — Z7982 Long term (current) use of aspirin: Secondary | ICD-10-CM

## 2020-07-04 DIAGNOSIS — J9621 Acute and chronic respiratory failure with hypoxia: Secondary | ICD-10-CM | POA: Diagnosis present

## 2020-07-04 DIAGNOSIS — Z7984 Long term (current) use of oral hypoglycemic drugs: Secondary | ICD-10-CM

## 2020-07-04 DIAGNOSIS — I5042 Chronic combined systolic (congestive) and diastolic (congestive) heart failure: Secondary | ICD-10-CM | POA: Diagnosis present

## 2020-07-04 DIAGNOSIS — I160 Hypertensive urgency: Secondary | ICD-10-CM | POA: Diagnosis present

## 2020-07-04 DIAGNOSIS — Z79899 Other long term (current) drug therapy: Secondary | ICD-10-CM

## 2020-07-04 DIAGNOSIS — N182 Chronic kidney disease, stage 2 (mild): Secondary | ICD-10-CM | POA: Diagnosis present

## 2020-07-04 DIAGNOSIS — I5043 Acute on chronic combined systolic (congestive) and diastolic (congestive) heart failure: Secondary | ICD-10-CM | POA: Diagnosis present

## 2020-07-04 DIAGNOSIS — E876 Hypokalemia: Secondary | ICD-10-CM | POA: Diagnosis not present

## 2020-07-04 DIAGNOSIS — Z9114 Patient's other noncompliance with medication regimen: Secondary | ICD-10-CM

## 2020-07-04 DIAGNOSIS — I130 Hypertensive heart and chronic kidney disease with heart failure and stage 1 through stage 4 chronic kidney disease, or unspecified chronic kidney disease: Principal | ICD-10-CM | POA: Diagnosis present

## 2020-07-04 DIAGNOSIS — I10 Essential (primary) hypertension: Secondary | ICD-10-CM | POA: Diagnosis present

## 2020-07-04 DIAGNOSIS — Z8249 Family history of ischemic heart disease and other diseases of the circulatory system: Secondary | ICD-10-CM

## 2020-07-04 LAB — MRSA PCR SCREENING: MRSA by PCR: NEGATIVE

## 2020-07-04 LAB — CBG MONITORING, ED: Glucose-Capillary: 165 mg/dL — ABNORMAL HIGH (ref 70–99)

## 2020-07-04 LAB — SARS CORONAVIRUS 2 BY RT PCR (HOSPITAL ORDER, PERFORMED IN ~~LOC~~ HOSPITAL LAB): SARS Coronavirus 2: NEGATIVE

## 2020-07-04 LAB — BASIC METABOLIC PANEL WITH GFR
Anion gap: 13 (ref 5–15)
BUN: 26 mg/dL — ABNORMAL HIGH (ref 6–20)
CO2: 22 mmol/L (ref 22–32)
Calcium: 8.8 mg/dL — ABNORMAL LOW (ref 8.9–10.3)
Chloride: 102 mmol/L (ref 98–111)
Creatinine, Ser: 1.67 mg/dL — ABNORMAL HIGH (ref 0.61–1.24)
GFR calc Af Amer: 53 mL/min — ABNORMAL LOW
GFR calc non Af Amer: 46 mL/min — ABNORMAL LOW
Glucose, Bld: 242 mg/dL — ABNORMAL HIGH (ref 70–99)
Potassium: 3.9 mmol/L (ref 3.5–5.1)
Sodium: 137 mmol/L (ref 135–145)

## 2020-07-04 LAB — BLOOD GAS, ARTERIAL
Acid-base deficit: 1 mmol/L (ref 0.0–2.0)
Bicarbonate: 24.7 mmol/L (ref 20.0–28.0)
FIO2: 90
O2 Saturation: 97.1 %
Patient temperature: 98.6
pCO2 arterial: 46.5 mmHg (ref 32.0–48.0)
pH, Arterial: 7.344 — ABNORMAL LOW (ref 7.350–7.450)
pO2, Arterial: 97.2 mmHg (ref 83.0–108.0)

## 2020-07-04 LAB — HEMOGLOBIN A1C
Hgb A1c MFr Bld: 6.6 % — ABNORMAL HIGH (ref 4.8–5.6)
Mean Plasma Glucose: 142.72 mg/dL

## 2020-07-04 LAB — CBC
HCT: 47.6 % (ref 39.0–52.0)
HCT: 44.6 % (ref 39.0–52.0)
Hemoglobin: 15.4 g/dL (ref 13.0–17.0)
Hemoglobin: 15 g/dL (ref 13.0–17.0)
MCH: 30.1 pg (ref 26.0–34.0)
MCH: 30.5 pg (ref 26.0–34.0)
MCHC: 32.4 g/dL (ref 30.0–36.0)
MCHC: 33.6 g/dL (ref 30.0–36.0)
MCV: 93 fL (ref 80.0–100.0)
MCV: 90.8 fL (ref 80.0–100.0)
Platelets: 223 10*3/uL (ref 150–400)
Platelets: 231 10*3/uL (ref 150–400)
RBC: 5.12 MIL/uL (ref 4.22–5.81)
RBC: 4.91 MIL/uL (ref 4.22–5.81)
RDW: 13 % (ref 11.5–15.5)
RDW: 13 % (ref 11.5–15.5)
WBC: 6 10*3/uL (ref 4.0–10.5)
WBC: 6.8 10*3/uL (ref 4.0–10.5)
nRBC: 0 % (ref 0.0–0.2)
nRBC: 0 % (ref 0.0–0.2)

## 2020-07-04 LAB — GLUCOSE, CAPILLARY: Glucose-Capillary: 168 mg/dL — ABNORMAL HIGH (ref 70–99)

## 2020-07-04 LAB — CREATININE, SERUM
Creatinine, Ser: 1.65 mg/dL — ABNORMAL HIGH (ref 0.61–1.24)
GFR calc Af Amer: 54 mL/min — ABNORMAL LOW (ref >60)
GFR calc non Af Amer: 47 mL/min — ABNORMAL LOW (ref >60)

## 2020-07-04 LAB — TROPONIN I (HIGH SENSITIVITY)
Troponin I (High Sensitivity): 37 ng/L — ABNORMAL HIGH (ref <18)
Troponin I (High Sensitivity): 47 ng/L — ABNORMAL HIGH (ref <18)

## 2020-07-04 LAB — BRAIN NATRIURETIC PEPTIDE: B Natriuretic Peptide: 264.4 pg/mL — ABNORMAL HIGH (ref 0.0–100.0)

## 2020-07-04 MED ORDER — ISOSORBIDE MONONITRATE ER 60 MG PO TB24
30.0000 mg | ORAL_TABLET | Freq: Every day | ORAL | Status: DC
  Administered 2020-07-04: 30 mg via ORAL
  Filled 2020-07-04: qty 1

## 2020-07-04 MED ORDER — CHLORHEXIDINE GLUCONATE CLOTH 2 % EX PADS
6.0000 | MEDICATED_PAD | Freq: Every day | CUTANEOUS | Status: DC
  Administered 2020-07-04 – 2020-07-06 (×3): 6 via TOPICAL

## 2020-07-04 MED ORDER — ENOXAPARIN SODIUM 40 MG/0.4ML ~~LOC~~ SOLN
40.0000 mg | SUBCUTANEOUS | Status: DC
  Administered 2020-07-04 – 2020-07-06 (×3): 40 mg via SUBCUTANEOUS
  Filled 2020-07-04 (×3): qty 0.4

## 2020-07-04 MED ORDER — SODIUM CHLORIDE 0.9% FLUSH
3.0000 mL | Freq: Once | INTRAVENOUS | Status: AC
  Administered 2020-07-06: 3 mL via INTRAVENOUS

## 2020-07-04 MED ORDER — LISINOPRIL 2.5 MG PO TABS
5.0000 mg | ORAL_TABLET | Freq: Every day | ORAL | Status: DC
  Administered 2020-07-04 – 2020-07-06 (×3): 5 mg via ORAL
  Filled 2020-07-04: qty 2
  Filled 2020-07-04: qty 1
  Filled 2020-07-04: qty 2

## 2020-07-04 MED ORDER — ACETAMINOPHEN 325 MG PO TABS
650.0000 mg | ORAL_TABLET | Freq: Four times a day (QID) | ORAL | Status: DC | PRN
  Administered 2020-07-05: 650 mg via ORAL
  Filled 2020-07-04: qty 2

## 2020-07-04 MED ORDER — HYDRALAZINE HCL 25 MG PO TABS: 50.0000 mg | ORAL_TABLET | Freq: Three times a day (TID) | ORAL | Status: DC

## 2020-07-04 MED ORDER — ACETAMINOPHEN 650 MG RE SUPP: 650.0000 mg | Freq: Four times a day (QID) | RECTAL | Status: DC | PRN

## 2020-07-04 MED ORDER — NITROGLYCERIN IN D5W 200-5 MCG/ML-% IV SOLN
0.0000 ug/min | INTRAVENOUS | Status: DC
  Administered 2020-07-04: 350 ug/min via INTRAVENOUS
  Administered 2020-07-04: 5 ug/min via INTRAVENOUS
  Administered 2020-07-04: 350 ug/min via INTRAVENOUS
  Administered 2020-07-04: 200 ug/min via INTRAVENOUS
  Administered 2020-07-05: 50 ug/min via INTRAVENOUS
  Filled 2020-07-04 (×5): qty 250

## 2020-07-04 MED ORDER — HYDRALAZINE HCL 20 MG/ML IJ SOLN
5.0000 mg | INTRAMUSCULAR | Status: DC | PRN
  Administered 2020-07-04 (×3): 5 mg via INTRAVENOUS
  Filled 2020-07-04 (×3): qty 1

## 2020-07-04 MED ORDER — CARVEDILOL 12.5 MG PO TABS: 12.5000 mg | ORAL_TABLET | Freq: Two times a day (BID) | ORAL | Status: DC

## 2020-07-04 MED ORDER — ISOSORBIDE MONONITRATE ER 30 MG PO TB24
15.0000 mg | ORAL_TABLET | Freq: Every day | ORAL | Status: DC
  Administered 2020-07-05 – 2020-07-06 (×2): 15 mg via ORAL
  Filled 2020-07-04 (×2): qty 1

## 2020-07-04 MED ORDER — HYDRALAZINE HCL 50 MG PO TABS
50.0000 mg | ORAL_TABLET | Freq: Three times a day (TID) | ORAL | Status: DC
  Administered 2020-07-04 – 2020-07-06 (×5): 50 mg via ORAL
  Filled 2020-07-04 (×5): qty 1

## 2020-07-04 MED ORDER — FUROSEMIDE 10 MG/ML IJ SOLN: 60.0000 mg | Freq: Two times a day (BID) | INTRAMUSCULAR | Status: DC

## 2020-07-04 MED ORDER — ATORVASTATIN CALCIUM 40 MG PO TABS
80.0000 mg | ORAL_TABLET | Freq: Every day | ORAL | Status: DC
  Administered 2020-07-04 – 2020-07-06 (×3): 80 mg via ORAL
  Filled 2020-07-04 (×3): qty 2

## 2020-07-04 MED ORDER — TIMOLOL MALEATE 0.5 % OP SOLN
1.0000 [drp] | Freq: Every day | OPHTHALMIC | Status: DC
  Administered 2020-07-04 – 2020-07-06 (×3): 1 [drp] via OPHTHALMIC
  Filled 2020-07-04: qty 5

## 2020-07-04 MED ORDER — CARVEDILOL 12.5 MG PO TABS
12.5000 mg | ORAL_TABLET | Freq: Two times a day (BID) | ORAL | Status: DC
  Administered 2020-07-05 – 2020-07-06 (×3): 12.5 mg via ORAL
  Filled 2020-07-04 (×3): qty 1

## 2020-07-04 MED ORDER — CARVEDILOL 12.5 MG PO TABS
25.0000 mg | ORAL_TABLET | Freq: Two times a day (BID) | ORAL | Status: DC
  Administered 2020-07-04: 25 mg via ORAL
  Filled 2020-07-04: qty 2

## 2020-07-04 MED ORDER — INSULIN ASPART 100 UNIT/ML ~~LOC~~ SOLN
0.0000 [IU] | Freq: Three times a day (TID) | SUBCUTANEOUS | Status: DC
  Administered 2020-07-04 – 2020-07-06 (×6): 3 [IU] via SUBCUTANEOUS
  Filled 2020-07-04: qty 0.15

## 2020-07-04 MED ORDER — FUROSEMIDE 10 MG/ML IJ SOLN
60.0000 mg | Freq: Two times a day (BID) | INTRAMUSCULAR | Status: DC
  Administered 2020-07-04 – 2020-07-06 (×4): 60 mg via INTRAVENOUS
  Filled 2020-07-04 (×4): qty 6

## 2020-07-04 MED ORDER — HYDRALAZINE HCL 25 MG PO TABS: 75.0000 mg | ORAL_TABLET | Freq: Three times a day (TID) | ORAL | Status: DC

## 2020-07-04 MED ORDER — HYDRALAZINE HCL 50 MG PO TABS
75.0000 mg | ORAL_TABLET | Freq: Three times a day (TID) | ORAL | Status: DC
  Administered 2020-07-04 (×2): 75 mg via ORAL
  Filled 2020-07-04: qty 3
  Filled 2020-07-04: qty 1

## 2020-07-04 MED ORDER — INSULIN ASPART 100 UNIT/ML ~~LOC~~ SOLN
0.0000 [IU] | Freq: Every day | SUBCUTANEOUS | Status: DC
  Filled 2020-07-04: qty 0.05

## 2020-07-04 MED ORDER — FUROSEMIDE 10 MG/ML IJ SOLN
80.0000 mg | Freq: Once | INTRAMUSCULAR | Status: AC
  Administered 2020-07-04: 80 mg via INTRAVENOUS
  Filled 2020-07-04: qty 8

## 2020-07-04 NOTE — ED Notes (Signed)
Respiratory called to take Bipap off. Patient requesting Bipap to come off. Will give PO meds while Bipap off. Patient placed on 4 liters [place] and given snack.

## 2020-07-04 NOTE — H&P (Signed)
History and Physical    Matthew [name]. CHY:[id number] DOB: 1966/08/27 DOA: 07/04/2020  PCP: Matthew Rakes, MD  Patient coming from: Home  Chief Complaint: SOB  HPI: Matthew [name]. is a 54 y.o. male with medical history significant of combined systolic and diastolic CHF with EF of 87-86% from 3/21 echo, DM, HTN, obesity who presents with increased sob. Pt currently on bipap and is very drowsy, thus history could not be obtained from pt at this time. Pt reportedly presented via EMS with o2 sats of 36% on RA, clinically volume overloaded thus placed on bipap. SBP initially noted to be in the 200's  ED Course: In the ED, pt was continued on bipap support. Pt was started on nitro gtt with improvement in BP. Cr 1.67 (was 1.68 on 02/23/20), K of 3.9, BNP of 264 with trop of 37 to 47. Pt was given 80mg  IV lasix. Hospitalist consulted for consideration for admission  Review of Systems:  Review of Systems  Unable to perform ROS: Mental acuity    Past Medical History:  Diagnosis Date  . Acute combined systolic and diastolic CHF, NYHA class 3 ([place])    a. 02/2014 Echo: EF 25-30%.  . Cardiomyopathy ([place])    a. 02/2014 Echo: EF 25-30%, sev glob HK with inferolat HK->AK, mod conc LVH, Gr 2 DD, Mild MR, sev dil LA.  . Depression   . DM (diabetes mellitus) ([place])   . High cholesterol   . Hypertension   . Morbid obesity ([place])   . Tobacco abuse     Past Surgical History:  Procedure Laterality Date  . CATARACT EXTRACTION Right   . LEFT HEART CATHETERIZATION WITH CORONARY ANGIOGRAM N/A 03/04/2014   Procedure: LEFT HEART CATHETERIZATION WITH CORONARY ANGIOGRAM;  Surgeon: Burnell Blanks, MD;  Location: [facility] CATH LAB;  Service: Cardiovascular;  Laterality: N/A;  . TEE WITHOUT CARDIOVERSION N/A 02/04/2017   Procedure: TRANSESOPHAGEAL ECHOCARDIOGRAM (TEE);  Surgeon: Dorothy Spark, MD;  Location: [facility] ENDOSCOPY;  Service: Cardiovascular;  Laterality: N/A;     reports that he has been  smoking cigarettes. He has smoked for the past 20.00 years. He has never used smokeless tobacco. He reports that he does not drink alcohol and does not use drugs.  No Known Allergies  Family History  Problem Relation Age of Onset  . Heart attack Father        died @ 80  . Cervical cancer Mother        died @ 35    Prior to Admission medications   Medication Sig Start Date End Date Taking? Authorizing Provider  acetaminophen (TYLENOL) 500 MG tablet Take 500 mg by mouth daily as needed for mild pain or moderate pain.    Yes [provider]  atorvastatin (LIPITOR) 80 MG tablet Take 1 tablet (80 mg total) by mouth daily. 12/02/19  Yes Matthew Rakes, MD  carvedilol (COREG) 12.5 MG tablet Take 1 tablet (12.5 mg total) by mouth 2 (two) times daily with a meal. Must have office visit for refills 12/02/19  Yes Matthew [name], [place], MD  glipiZIDE (GLUCOTROL) 5 MG tablet Take 1 tablet (5 mg total) by mouth 2 (two) times daily before a meal. Must have office visit for refills 12/02/19  Yes Matthew Rakes, MD  hydrALAZINE (APRESOLINE) 50 MG tablet Take 1 tablet (50 mg total) by mouth 3 (three) times daily. 12/02/19  Yes Matthew [name], Matthew Ferretti, MD  lisinopril (ZESTRIL) 5 MG tablet Take 1 tablet (5 mg total) by mouth  daily. 12/02/19  Yes Matthew Rakes, MD  timolol (TIMOPTIC) 0.5 % ophthalmic solution Place 1 drop into both eyes daily after breakfast. 06/21/20  Yes [provider]  aspirin 81 MG tablet Take 1 tablet (81 mg total) by mouth daily. Patient not taking: Reported on 11/18/2018 07/11/18   Matthew Rakes, MD  ezetimibe (ZETIA) 10 MG tablet Take 1 tablet (10 mg total) by mouth daily. Patient not taking: Reported on 07/04/2020 12/02/19   Matthew Rakes, MD  FLUoxetine (PROZAC) 20 MG tablet Take 1 tablet (20 mg total) by mouth daily. Patient not taking: Reported on 07/04/2020 12/02/19   Matthew Rakes, MD  furosemide (LASIX) 40 MG tablet Take 1 tablet (40 mg total) by mouth daily. Patient  not taking: Reported on 07/04/2020 12/02/19   Matthew Rakes, MD  glucose blood (ONE TOUCH ULTRA TEST) test strip Use as instructed Patient not taking: Reported on 02/19/2019 12/31/16   Matthew Rakes, MD  ibuprofen (ADVIL,MOTRIN) 800 MG tablet Take 1 tablet (800 mg total) by mouth every 6 (six) hours as needed. Patient not taking: Reported on 02/19/2019 09/12/18   Dalia Heading, PA-C  lovastatin (MEVACOR) 20 MG tablet Take 2 tablets (40 mg total) by mouth at bedtime. 06/24/14 08/26/14  Lorayne Marek, MD    Physical Exam: Vitals:   07/04/20 0703 07/04/20 0708 07/04/20 0715 07/04/20 0741  BP: (!) 134/92 (!) 135/93 (!) 130/97 (!) 130/94  Pulse: 76 83 72 72  Resp: 20 18 18 14   Temp:      TempSrc:      SpO2: 100% 100% 100% 100%  Weight:      Height:        Constitutional: NAD, calm, comfortable on bipap when seen Vitals:   07/04/20 0703 07/04/20 0708 07/04/20 0715 07/04/20 0741  BP: (!) 134/92 (!) 135/93 (!) 130/97 (!) 130/94  Pulse: 76 83 72 72  Resp: 20 18 18 14   Temp:      TempSrc:      SpO2: 100% 100% 100% 100%  Weight:      Height:       Eyes: PERRL, lids and conjunctivae normal ENMT: Mucous membranes are moist. normal dentition.  Neck: normal, supple, no masses, no thyromegaly Respiratory: increased resp effort on bipap, decreased BS throughout.  Cardiovascular: Regular rate and rhythm, s1, s2, LE edema  Abdomen: no tenderness, no masses palpated. No hepatosplenomegaly. Bowel sounds positive.  Musculoskeletal: no clubbing / cyanosis. No joint deformity upper and lower extremities. Good ROM, no contractures. Normal muscle tone.  Skin: no rashes, lesions, ulcers. No induration Neurologic: CN 2-12 grossly intact. No tremors Psychiatric: Unable to assess given current level of consciousness    Labs on Admission: I have personally reviewed following labs and imaging studies  CBC: Recent Labs  Lab 07/04/20 0337  WBC 6.0  HGB 15.4  HCT 47.6  MCV 93.0  PLT 811    Basic Metabolic Panel: Recent Labs  Lab 07/04/20 0337  NA 137  K 3.9  CL 102  CO2 22  GLUCOSE 242*  BUN 26*  CREATININE 1.67*  CALCIUM 8.8*   GFR: Estimated Creatinine Clearance: 64.5 mL/min (A) (by C-G formula based on SCr of 1.67 mg/dL (H)). Liver Function Tests: No results for input(s): AST, ALT, ALKPHOS, BILITOT, PROT, ALBUMIN in the last 168 hours. No results for input(s): LIPASE, AMYLASE in the last 168 hours. No results for input(s): AMMONIA in the last 168 hours. Coagulation Profile: No results for input(s): INR, PROTIME in the last 168 hours.  Cardiac Enzymes: No results for input(s): CKTOTAL, CKMB, CKMBINDEX, TROPONINI in the last 168 hours. BNP (last 3 results) No results for input(s): PROBNP in the last 8760 hours. HbA1C: No results for input(s): HGBA1C in the last 72 hours. CBG: No results for input(s): GLUCAP in the last 168 hours. Lipid Profile: No results for input(s): CHOL, HDL, LDLCALC, TRIG, CHOLHDL, LDLDIRECT in the last 72 hours. Thyroid Function Tests: No results for input(s): TSH, T4TOTAL, FREET4, T3FREE, THYROIDAB in the last 72 hours. Anemia Panel: No results for input(s): VITAMINB12, FOLATE, FERRITIN, TIBC, IRON, RETICCTPCT in the last 72 hours. Urine analysis:    Component Value Date/Time   COLORURINE STRAW (A) 02/15/2020 0946   APPEARANCEUR CLEAR 02/15/2020 0946   LABSPEC 1.008 02/15/2020 0946   PHURINE 5.0 02/15/2020 0946   GLUCOSEU NEGATIVE 02/15/2020 0946   HGBUR NEGATIVE 02/15/2020 0946   BILIRUBINUR NEGATIVE 02/15/2020 0946   KETONESUR NEGATIVE 02/15/2020 0946   PROTEINUR NEGATIVE 02/15/2020 0946   NITRITE NEGATIVE 02/15/2020 0946   LEUKOCYTESUR NEGATIVE 02/15/2020 0946   Sepsis Labs: !!!!!!!!!!!!!!!!!!!!!!!!!!!!!!!!!!!!!!!!!!!! @LABRCNTIP (procalcitonin:4,lacticidven:4) ) Recent Results (from the past 240 hour(s))  SARS Coronavirus 2 by RT PCR (hospital order, performed in [place] hospital lab) Nasopharyngeal Nasopharyngeal  Swab     Status: None   Collection Time: 07/04/20  3:44 AM   Specimen: Nasopharyngeal Swab  Result Value Ref Range Status   SARS Coronavirus 2 NEGATIVE NEGATIVE Final    Comment: (NOTE) SARS-CoV-2 target nucleic acids are NOT DETECTED.  The SARS-CoV-2 RNA is generally detectable in upper and lower respiratory specimens during the acute phase of infection. The lowest concentration of SARS-CoV-2 viral copies this assay can detect is 250 copies / mL. A negative result does not preclude SARS-CoV-2 infection and should not be used as the sole basis for treatment or other patient management decisions.  A negative result may occur with improper specimen collection / handling, submission of specimen other than nasopharyngeal swab, presence of viral mutation(s) within the areas targeted by this assay, and inadequate number of viral copies (<250 copies / mL). A negative result must be combined with clinical observations, patient history, and epidemiological information.  Fact Sheet for Patients:   StrictlyIdeas.no  Fact Sheet for Healthcare Providers: BankingDealers.co.za  This test is not yet approved or  cleared by the [place] FDA and has been authorized for detection and/or diagnosis of SARS-CoV-2 by FDA under an Emergency Use Authorization (EUA).  This EUA will remain in effect (meaning this test can be used) for the duration of the COVID-19 declaration under Section 564(b)(1) of the Act, 21 U.S.C. section 360bbb-3(b)(1), unless the authorization is terminated or revoked sooner.  Performed at [facility], [place] [address] [place], [place] [address]      Radiological Exams on Admission: DG Chest Portable 1 View  Result Date: 07/04/2020 CLINICAL DATA:  Shortness of breath.  Respiratory distress. EXAM: PORTABLE CHEST 1 VIEW COMPARISON:  02/15/2020 FINDINGS: The cardiac silhouette remains enlarged. There is  improved aeration of the lungs compared to the prior study without significant residual vascular congestion. Mild opacity is present in the left lung base. No sizable pleural effusion or pneumothorax is identified. No acute osseous abnormality is seen. IMPRESSION: Improved lung aeration. Mild left basilar opacity likely reflecting atelectasis. Electronically Signed   By: Logan Bores M.D.   On: 07/04/2020 04:21    EKG: Independently reviewed. Sinus  Assessment/Plan Principal Problem:   Acute on chronic combined systolic and diastolic CHF (congestive heart failure) ([place])  Active Problems:   Hypertension   Chronic combined systolic and diastolic congestive heart failure ([facility])   Morbid obesity ([facility])   CKD (chronic kidney disease) stage 2, GFR 60-89 ml/min   Diabetes mellitus type II, controlled ([place])   OSA (obstructive sleep apnea)   Mixed hyperlipidemia   Acute respiratory failure with hypoxia ([place])   1. Acute on chronic combined systolic/diastolic CHF 1. EF of 40-10% from 3/21 2d echo 2. Pt given one dose of IV lasix, bedside urinal with very clear urine noted 3. Will cont on 60mg  IV BID lasix, monitor I/o, daily wts 4. High sensitive trop is very minimally elevated, likely reflecting below Hypertensive crisis as well as acute heart failure 5. Follow lytes and correct as needed 6. CXR personally reviewed, appears stable with mild basilar opacity likely reflecting atelectasis 7. Continue on bipap for now, wean as tolerated 8. Admit to SDU 2. HTN, with hypertensive crisis 1. Presents with sbp in excess of 200's with volume overload 2. BP improved on nitro gtt. Will wean as tolerated 3. Resume home meds as tolerated and cont on PRN hydralazine for sbp >160 3. DM2 1. Random glucose stable currently  2. Will hold home oral DM meds while in hospital 3. Continue on SSI coverage 4. HLD 1. Continue statin as tolerated 5. CKD2 1. Presenting Cr essentially unchanged from prior measurement  earlier in the year 2. Will follow daily bmet while on diuretic 6. Morbid obesity 1. Would recommend diet/lifestyle modification when more alert 7. OSA 1. Currently on bipap per above 2. Per family, patient normally uses cpap at night  DVT prophylaxis: lovenox subq  Code Status: Full Family Communication: Pt in room, called and updated POA listed Disposition Plan: uncertain at this time  Consults called:  Admission status: Inpatient given acute heart failure on acute resp failure needing bipap, will require greater than 97midnight stay given increased mortality   Marylu Lund MD [facility] Pager On Amion  If 7PM-7AM, please contact night-coverage  07/04/2020, 7:53 AM

## 2020-07-04 NOTE — ED Provider Notes (Signed)
[facility] DEPT Provider Note   CSN: [id number] Arrival date & time: 07/04/20  0326     History Chief Complaint  Patient presents with  . Shortness of Breath    Matthew [name]. is a 54 y.o. male with past medical history significant for CHF, cardiomyopathy, diabetes, hypertension, hyperlipidemia, tobacco abuse.  HPI Patient presents to emergency department today via EMS with chief complaint of respiratory distress.  Level 5 caveat applies as patient is unable to provide history due to acuity of condition. Patient did say he has had increased productive cough lately, he had his covid vaccinations and no recent illness.  History obtained by EMS.  EMS states patient called for difficulty breathing on their arrival he had an oxygen saturation of 36% on room air.  Nonrebreather was applied with improvement of saturations into the 90s.  They also state patient was pale and diaphoretic.  He was hypertensive with a systolic in the 119E. Patient given nitro x 3.   Chart review shows patient had echo in 02/2020 with a EF of 40 to 45%.   Past Medical History:  Diagnosis Date  . Acute combined systolic and diastolic CHF, NYHA class 3 ([place])    a. 02/2014 Echo: EF 25-30%.  . Cardiomyopathy ([place])    a. 02/2014 Echo: EF 25-30%, sev glob HK with inferolat HK->AK, mod conc LVH, Gr 2 DD, Mild MR, sev dil LA.  . Depression   . DM (diabetes mellitus) ([place])   . High cholesterol   . Hypertension   . Morbid obesity ([place])   . Tobacco abuse     Patient Active Problem List   Diagnosis Date Noted  . Acute respiratory failure with hypoxia ([place]) 02/15/2020  . Acute on chronic combined systolic and diastolic CHF (congestive heart failure) ([place]) 02/15/2020  . Hypertensive urgency 02/15/2020  . CHF (congestive heart failure) ([place]) 02/15/2020  . 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 Expressive aphasia 02/20/2017  . Mixed hyperlipidemia   . History of stroke  01/31/2017  . Erectile dysfunction 07/30/2016  . OSA (obstructive sleep apnea) 05/10/2015  . CKD (chronic kidney disease) stage 2, GFR 60-89 ml/min 03/10/2014  . Diabetes mellitus type II, controlled ([place]) 03/10/2014  . Chronic combined systolic and diastolic congestive heart failure ([place]) 03/03/2014  . Smoker   . Morbid obesity ([place])   . Hypertension 03/02/2014    Past Surgical History:  Procedure Laterality Date  . CATARACT EXTRACTION Right   . LEFT HEART CATHETERIZATION WITH CORONARY ANGIOGRAM N/A 03/04/2014   Procedure: LEFT HEART CATHETERIZATION WITH CORONARY ANGIOGRAM;  Surgeon: Burnell Blanks, MD;  Location[facility] CATH LAB;  Service: Cardiovascular;  Laterality: N/A;  . TEE WITHOUT CARDIOVERSION N/A 02/04/2017   Procedure: TRANSESOPHAGEAL ECHOCARDIOGRAM (TEE);  Surgeon: Dorothy Spark, MD;  Location: [facility] ENDOSCOPY;  Service: Cardiovascular;  Laterality: N/A;       Family History  Problem Relation Age of Onset  . Heart attack Father        died @ 29  . Cervical cancer Mother        died @ 21    Social History   Tobacco Use  . Smoking status: Current Some Day Smoker    Years: 20.00    Types: Cigarettes  . Smokeless tobacco: Never Used  Vaping Use  . Vaping Use: Never used  Substance Use Topics  . Alcohol use: No  . Drug use: No    Home Medications Prior to Admission medications  Medication Sig Start Date End Date Taking? Authorizing Provider  acetaminophen (TYLENOL) 500 MG tablet Take 500 mg by mouth daily as needed for mild pain or moderate pain.    Yes [provider]  atorvastatin (LIPITOR) 80 MG tablet Take 1 tablet (80 mg total) by mouth daily. 12/02/19  Yes Charlott Rakes, MD  carvedilol (COREG) 12.5 MG tablet Take 1 tablet (12.5 mg total) by mouth 2 (two) times daily with a meal. Must have office visit for refills 12/02/19  Yes Newlin, Enobong, MD  glipiZIDE (GLUCOTROL) 5 MG tablet Take 1 tablet (5 mg total) by mouth 2 (two) times daily  before a meal. Must have office visit for refills 12/02/19  Yes Charlott Rakes, MD  hydrALAZINE (APRESOLINE) 50 MG tablet Take 1 tablet (50 mg total) by mouth 3 (three) times daily. 12/02/19  Yes Newlin, Charlane Ferretti, MD  lisinopril (ZESTRIL) 5 MG tablet Take 1 tablet (5 mg total) by mouth daily. 12/02/19  Yes Charlott Rakes, MD  timolol (TIMOPTIC) 0.5 % ophthalmic solution Place 1 drop into both eyes daily after breakfast. 06/21/20  Yes [provider]  aspirin 81 MG tablet Take 1 tablet (81 mg total) by mouth daily. Patient not taking: Reported on 11/18/2018 07/11/18   Charlott Rakes, MD  ezetimibe (ZETIA) 10 MG tablet Take 1 tablet (10 mg total) by mouth daily. Patient not taking: Reported on 07/04/2020 12/02/19   Charlott Rakes, MD  FLUoxetine (PROZAC) 20 MG tablet Take 1 tablet (20 mg total) by mouth daily. Patient not taking: Reported on 07/04/2020 12/02/19   Charlott Rakes, MD  furosemide (LASIX) 40 MG tablet Take 1 tablet (40 mg total) by mouth daily. Patient not taking: Reported on 07/04/2020 12/02/19   Charlott Rakes, MD  glucose blood (ONE TOUCH ULTRA TEST) test strip Use as instructed Patient not taking: Reported on 02/19/2019 12/31/16   Charlott Rakes, MD  ibuprofen (ADVIL,MOTRIN) 800 MG tablet Take 1 tablet (800 mg total) by mouth every 6 (six) hours as needed. Patient not taking: Reported on 02/19/2019 09/12/18   Dalia Heading, PA-C  lovastatin (MEVACOR) 20 MG tablet Take 2 tablets (40 mg total) by mouth at bedtime. 06/24/14 08/26/14  Lorayne Marek, MD    Allergies    Patient has no known allergies.  Review of Systems   Review of Systems  Unable to perform ROS: Acuity of condition    Physical Exam Updated Vital Signs BP (!) 167/119 (BP Location: Left Arm)   Pulse 80   Resp (!) 33   Ht 5\' 10"  (1.778 m)   Wt 113.4 kg   SpO2 100%   BMI 35.87 kg/m  Temp: 97.8 axillary   Physical Exam Vitals and nursing note reviewed.  Constitutional:      General: He is in acute  distress.     Appearance: He is diaphoretic. He is not ill-appearing.     Comments: Patient is drowsy. Falls asleep easily when talking  HENT:     Head: Normocephalic and atraumatic.     Right Ear: Tympanic membrane and external ear normal.     Left Ear: Tympanic membrane and external ear normal.     Nose: Nose normal.     Mouth/Throat:     Mouth: Mucous membranes are moist.     Pharynx: Oropharynx is clear.  Eyes:     General: No scleral icterus.       Right eye: No discharge.        Left eye: No discharge.  Extraocular Movements: Extraocular movements intact.     Conjunctiva/sclera: Conjunctivae normal.     Pupils: Pupils are equal, round, and reactive to light.  Neck:     Vascular: No JVD.  Cardiovascular:     Rate and Rhythm: Normal rate and regular rhythm.     Pulses: Normal pulses.          Radial pulses are 2+ on the right side and 2+ on the left side.     Heart sounds: Normal heart sounds.  Pulmonary:     Comments: Non rebreather. Accessory muscles use. Lung sounds diminished throughout.  Patient is tachypneic and only able to speak in short phrases. Symmetric chest rise. No wheezing, rales, or rhonchi.   Abdominal:     Comments: Abdomen is soft, non-distended, and non-tender in all quadrants. No rigidity, no guarding. No peritoneal signs.  Musculoskeletal:        General: Normal range of motion.     Cervical back: Normal range of motion.     Right lower leg: No edema.     Left lower leg: No edema.  Skin:    General: Skin is warm.     Capillary Refill: Capillary refill takes less than 2 seconds.  Neurological:     Mental Status: He is oriented to person, place, and time.     GCS: GCS eye subscore is 4. GCS verbal subscore is 5. GCS motor subscore is 6.     Comments: Fluent speech, no facial droop.  Psychiatric:        Behavior: Behavior normal.     ED Results / Procedures / Treatments   Labs (all labs ordered are listed, but only abnormal results are  displayed) Labs Reviewed  BASIC METABOLIC PANEL - Abnormal; Notable for the following components:      Result Value   Glucose, Bld 242 (*)    BUN 26 (*)    Creatinine, Ser 1.67 (*)    Calcium 8.8 (*)    GFR calc non Af Amer 46 (*)    GFR calc Af Amer 53 (*)    All other components within normal limits  BRAIN NATRIURETIC PEPTIDE - Abnormal; Notable for the following components:   B Natriuretic Peptide 264.4 (*)    All other components within normal limits  BLOOD GAS, ARTERIAL - Abnormal; Notable for the following components:   pH, Arterial 7.344 (*)    All other components within normal limits  TROPONIN I (HIGH SENSITIVITY) - Abnormal; Notable for the following components:   Troponin I (High Sensitivity) 37 (*)    All other components within normal limits  SARS CORONAVIRUS 2 BY RT PCR (HOSPITAL ORDER, [place])  CBC  TROPONIN I (HIGH SENSITIVITY)    EKG None  Radiology DG Chest Portable 1 View  Result Date: 07/04/2020 CLINICAL DATA:  Shortness of breath.  Respiratory distress. EXAM: PORTABLE CHEST 1 VIEW COMPARISON:  02/15/2020 FINDINGS: The cardiac silhouette remains enlarged. There is improved aeration of the lungs compared to the prior study without significant residual vascular congestion. Mild opacity is present in the left lung base. No sizable pleural effusion or pneumothorax is identified. No acute osseous abnormality is seen. IMPRESSION: Improved lung aeration. Mild left basilar opacity likely reflecting atelectasis. Electronically Signed   By: Logan Bores M.D.   On: 07/04/2020 04:21    Procedures .Critical Care Performed by: Cherre Robins, PA-C Authorized by: Cherre Robins, PA-C   Critical care provider statement:  Critical care time (minutes):  40   Critical care time was exclusive of:  Separately billable procedures and treating other patients and teaching time   Critical care was necessary to treat or prevent imminent  or life-threatening deterioration of the following conditions:  Respiratory failure   Critical care was time spent personally by me on the following activities:  Development of treatment plan with patient or surrogate, examination of patient, obtaining history from patient or surrogate, ordering and performing treatments and interventions, ordering and review of laboratory studies, ordering and review of radiographic studies, pulse oximetry, re-evaluation of patient's condition, review of old charts and evaluation of patient's response to treatment   I assumed direction of critical care for this patient from another provider in my specialty: no     (including critical care time)  Medications Ordered in ED Medications  sodium chloride flush (NS) 0.9 % injection 3 mL (3 mLs Intravenous Not Given 07/04/20 0401)  nitroGLYCERIN 50 mg in dextrose 5 % 250 mL (0.2 mg/mL) infusion (400 mcg/min Intravenous Rate/Dose Change 07/04/20 0605)  furosemide (LASIX) injection 80 mg (80 mg Intravenous Given 07/04/20 0524)    ED Course  I have reviewed the triage vital signs and the nursing notes.  Pertinent labs & imaging results that were available during my care of the patient were reviewed by me and considered in my medical decision making (see chart for details).  Clinical Course as of Jul 05 635  Mon Jul 04, 2020  0351 BP on arrival 167/119   [KA]  [id number] Patient reassessed and BP 185/119.  Nitro drip ordered to titrate   [KA]    Clinical Course User Index [KA] Cherre Robins, [place]   Vitals:   07/04/20 0610 07/04/20 0615 07/04/20 0630 07/04/20 0633  BP: 133/89 123/77 (!) 144/97 (!) 144/97  Pulse: 73 74 74 78  Resp: 12 18 13 14   Temp:      TempSrc:      SpO2: 100% 100% 100% 100%  Weight:      Height:         MDM Rules/Calculators/A&P                          History provided by EMS with additional history obtained from chart review.   54 year old male presents to emergency department  in respiratory distress.  I evaluated patient on arrival.  Patient is on nonrebreather he is tachypneic and hypertensive.  Lung sounds are diminished throughout. RT and ED attending Dr. Dayna Barker at the bedside as well.  BiPap ordered.   Labs show no leukocytosis, no anemia, BUN elevated at 26, creatinine at baseline, no severe electrolyte derangement.  ABG with pH of 7.34, unremarkable.  Troponin 37.  Covid test negative.  BNP 264 appears consistent with basline. EKG without ischemic changes compared to prior. I viewed pt's chest xray and it is not suggestive of pulmonary edema.  There is an opacity in the left lung base, ?atelectasis. Patient on BiPAP and blood pressure remained elevated with persistent tachypnea and increased work of breathing.  Nitro infusion drip titrated to help with blood pressure plan to titrate down to help with vasodilation as well.  Most recent reassessment shows patient's work of breathing is slightly improved, BP 678 systolic, nitro drip currently at 400 mcg. IV lasix given with good urine output.  Spoke with Dr. Hal Hope with hospitalist service who agrees to assume care of patient and bring into the hospital  for further evaluation and management for acute respiratory failure secondary to acute on chronic combined systolic and diastolic heart failure.   Portions of this note were generated with [employer]. Dictation errors may occur despite best attempts at proofreading.  Final Clinical Impression(s) / ED Diagnoses Final diagnoses:  Acute on chronic respiratory failure with hypoxia ([place])  Acute on chronic combined systolic and diastolic congestive heart failure [facility])    Rx / DC Orders ED Discharge Orders    None       Flint Melter 07/04/20 7183    Mesner, Corene Cornea, MD 07/04/20 6725    Merrily Pew, MD 07/04/20 [phone]

## 2020-07-04 NOTE — ED Notes (Signed)
Pt refused rectal Temp.

## 2020-07-04 NOTE — ED Notes (Signed)
Respiratory called to reposition BIPAP on patients face. Patient agitated and continuously moving in bed.

## 2020-07-04 NOTE — ED Triage Notes (Signed)
AS per EMS pt intal sat was 36%. Pt was placed on on-rebreather. Hx heart failure

## 2020-07-04 NOTE — Progress Notes (Signed)
Pt adamantly refusing CPAP QHS because the hospital does not have the exact nasal mask he wears at home with his machine.  Machine at bedside if pt decides to wear.  RN aware, RT to monitor and assess as needed.

## 2020-07-04 NOTE — ED Notes (Signed)
Spoke with Wyline Copas, MD and reviewed Nitro order change. Keep systolic BP [phone].

## 2020-07-04 NOTE — ED Notes (Addendum)
Respiratory at bedside.   Wyline Copas, MD paged and made aware of patients agitation with Bipap.

## 2020-07-04 NOTE — ED Notes (Signed)
MD paged to verify to start 3rd bottle of nitro. Nitro has been maxxed out and PRN hydralazine given. BP 150's

## 2020-07-05 DIAGNOSIS — I5043 Acute on chronic combined systolic (congestive) and diastolic (congestive) heart failure: Secondary | ICD-10-CM

## 2020-07-05 LAB — CBC
HCT: 47.4 % (ref 39.0–52.0)
Hemoglobin: 15.5 g/dL (ref 13.0–17.0)
MCH: 30.2 pg (ref 26.0–34.0)
MCHC: 32.7 g/dL (ref 30.0–36.0)
MCV: 92.2 fL (ref 80.0–100.0)
Platelets: 229 10*3/uL (ref 150–400)
RBC: 5.14 MIL/uL (ref 4.22–5.81)
RDW: 13 % (ref 11.5–15.5)
WBC: 7.7 10*3/uL (ref 4.0–10.5)
nRBC: 0 % (ref 0.0–0.2)

## 2020-07-05 LAB — BASIC METABOLIC PANEL
Anion gap: 10 (ref 5–15)
BUN: 18 mg/dL (ref 6–20)
CO2: 28 mmol/L (ref 22–32)
Calcium: 9 mg/dL (ref 8.9–10.3)
Chloride: 99 mmol/L (ref 98–111)
Creatinine, Ser: 1.49 mg/dL — ABNORMAL HIGH (ref 0.61–1.24)
GFR calc Af Amer: >60 mL/min (ref >60)
GFR calc non Af Amer: 53 mL/min — ABNORMAL LOW (ref >60)
Glucose, Bld: 203 mg/dL — ABNORMAL HIGH (ref 70–99)
Potassium: 3.2 mmol/L — ABNORMAL LOW (ref 3.5–5.1)
Sodium: 137 mmol/L (ref 135–145)

## 2020-07-05 MED ORDER — POTASSIUM CHLORIDE CRYS ER 20 MEQ PO TBCR
40.0000 meq | EXTENDED_RELEASE_TABLET | Freq: Once | ORAL | Status: AC
  Administered 2020-07-05: 40 meq via ORAL
  Filled 2020-07-05: qty 2

## 2020-07-05 NOTE — Progress Notes (Addendum)
PROGRESS NOTE    Matthew [name].  JQB:[id number]  DOB: [birth year]  DOA: 07/04/2020 PCP: Charlott Rakes, MD Outpatient Specialists:   Hospital course:  Matthew Watling. is a 54 y.o. male with medical history significant of combined systolic and diastolic CHF with EF of 40-97% from 3/21 echo, DM, HTN, obesity who was admitted 07/04/2020 with acute hypoxic respiratory failure secondary to decompensated systolic and diastolic heart failure.  On admission he was saturating at 36% on room air.  Patient was also noted to have hypertensive urgency with SBP in the 200s.  Patient was initially quite somnolent however was treated with with BiPAP to good effect.  Subjective:  Patient is quite sleepy.  When he wakes up he states that he feels okay.  Breathing is much better.  Admits that he has not been taking his medications as he usually does because his wife just died in early 03-24-2023 and he is having a hard time keeping it together.  He got a little teary when he said this.  States that he and his children have been in therapy addressing his grief.    He states he does have a cough which is productive of a thick grayish sputum.  Notes he does feel a lot less short of breath now than he did before.  Objective: Vitals:   07/05/20 1200 07/05/20 1300 07/05/20 1400 07/05/20 1500  BP: (!) 152/99 127/64 (!) 155/65   Pulse: 65 82 69 62  Resp: 20 17 19    Temp:      TempSrc:      SpO2: 100% 100% 99% 97%  Weight:      Height:        Intake/Output Summary (Last 24 hours) at 07/05/2020 1544 Last data filed at 07/05/2020 1500 Gross per 24 hour  Intake 647.05 ml  Output 3652 ml  Net -3004.95 ml   Filed Weights   07/04/20 0336 07/04/20 1256 07/05/20 0400  Weight: 113.4 kg 99 kg 97.5 kg     Exam:  General: Sleepy patient who is arousable by voice alone.  He does have a cough productive of a thick inspissated sputum. Eyes: sclera anicteric, conjuctiva mild injection bilaterally CVS:  S1-S2, regular  Respiratory:  decreased air entry bilaterally secondary to decreased inspiratory effort, rales one quarter of the way up bilaterally. GI: NABS, soft, NT  LE: No edema.  Neuro: A/O x 3, Moving all extremities equally with normal strength, grossly nonfocal.    Assessment & Plan:   Acute hypoxic respiratory failure secondary to decompensated systolic and diastolic heart failure  Patient has done well after treatment with Lasix 60 IV twice daily. He is saturating well on nasal cannula at present I am continuing Lasix at present doses overnight, this can be decreased down as warranted in the morning. He has been restarted back on his beta-blocker, ACE inhibitor, and hydralazine which will also work as an afterload reducer. If his cough progresses, could consider noncontrast chest CT to make sure he does not have a pneumonia at the left base.  Patient is afebrile with no leukocytosis.  Hypertensive urgency Patient has done well on nitroglycerin drip. His oral home medications have been restarted and he is going to be tapered off the drip Follow blood pressure with as needed hydralazine Although initial troponin was 37 and 47 on repeat, there really is no evidence of endorgan damage.  Hypokalemia We will replete and recheck  DM2 Under reasonable control on present SSI  CKD 2 Modest improvement with optimization of [place] forces.  OSA CPAP has been ordered however patient is refusing as patient is upset that we do not have his exact CPAP machine.  He says he cannot get his machine from home.  Obesity Patient may or may not have some element of obesity hypoventilation    DVT prophylaxis: Lovenox Code Status: Full Family Communication: None today Disposition Plan:   Patient is from: Home  Anticipated Discharge Location: Probably home  Barriers to Discharge: Ongoing decompensated heart failure  Is patient medically stable for Discharge:  No   Consultants:  None  Procedures:  None  Antimicrobials:  None   Data Reviewed:  Basic Metabolic Panel: Recent Labs  Lab 07/04/20 0337 07/04/20 1003 07/05/20 0656  NA 137  --  137  K 3.9  --  3.2*  CL 102  --  99  CO2 22  --  28  GLUCOSE 242*  --  203*  BUN 26*  --  18  CREATININE 1.67* 1.65* 1.49*  CALCIUM 8.8*  --  9.0   Liver Function Tests: No results for input(s): AST, ALT, ALKPHOS, BILITOT, PROT, ALBUMIN in the last 168 hours. No results for input(s): LIPASE, AMYLASE in the last 168 hours. No results for input(s): AMMONIA in the last 168 hours. CBC: Recent Labs  Lab 07/04/20 0337 07/04/20 1003 07/05/20 0656  WBC 6.0 6.8 7.7  HGB 15.4 15.0 15.5  HCT 47.6 44.6 47.4  MCV 93.0 90.8 92.2  PLT 223 231 229   Cardiac Enzymes: No results for input(s): CKTOTAL, CKMB, CKMBINDEX, TROPONINI in the last 168 hours. BNP (last 3 results) No results for input(s): PROBNP in the last 8760 hours. CBG: Recent Labs  Lab 07/04/20 1137 07/04/20 2129  GLUCAP 165* 168*    Recent Results (from the past 240 hour(s))  SARS Coronavirus 2 by RT PCR (hospital order, performed in [facility] hospital lab) Nasopharyngeal Nasopharyngeal Swab     Status: None   Collection Time: 07/04/20  3:44 AM   Specimen: Nasopharyngeal Swab  Result Value Ref Range Status   SARS Coronavirus 2 NEGATIVE NEGATIVE Final    Comment: (NOTE) SARS-CoV-2 target nucleic acids are NOT DETECTED.  The SARS-CoV-2 RNA is generally detectable in upper and lower respiratory specimens during the acute phase of infection. The lowest concentration of SARS-CoV-2 viral copies this assay can detect is 250 copies / mL. A negative result does not preclude SARS-CoV-2 infection and should not be used as the sole basis for treatment or other patient management decisions.  A negative result may occur with improper specimen collection / handling, submission of specimen other than nasopharyngeal swab, presence of  viral mutation(s) within the areas targeted by this assay, and inadequate number of viral copies (<250 copies / mL). A negative result must be combined with clinical observations, patient history, and epidemiological information.  Fact Sheet for Patients:   StrictlyIdeas.no  Fact Sheet for Healthcare Providers: BankingDealers.co.za  This test is not yet approved or  cleared by the [place] FDA and has been authorized for detection and/or diagnosis of SARS-CoV-2 by FDA under an Emergency Use Authorization (EUA).  This EUA will remain in effect (meaning this test can be used) for the duration of the COVID-19 declaration under Section 564(b)(1) of the Act, 21 U.S.C. section 360bbb-3(b)(1), unless the authorization is terminated or revoked sooner.  Performed at [facility], [place] [address] [place], [place] [address]   MRSA PCR Screening  Status: None   Collection Time: 07/04/20 11:05 AM   Specimen: Nasopharyngeal  Result Value Ref Range Status   MRSA by PCR NEGATIVE NEGATIVE Final    Comment:        The GeneXpert MRSA Assay (FDA approved for NASAL specimens only), is one component of a comprehensive MRSA colonization surveillance program. It is not intended to diagnose MRSA infection nor to guide or monitor treatment for MRSA infections. Performed at [facility], [place] [address], [place], [place] [address]       Studies: DG Chest Portable 1 View  Result Date: 07/04/2020 CLINICAL DATA:  Shortness of breath.  Respiratory distress. EXAM: PORTABLE CHEST 1 VIEW COMPARISON:  02/15/2020 FINDINGS: The cardiac silhouette remains enlarged. There is improved aeration of the lungs compared to the prior study without significant residual vascular congestion. Mild opacity is present in the left lung base. No sizable pleural effusion or pneumothorax is identified. No acute osseous abnormality is  seen. IMPRESSION: Improved lung aeration. Mild left basilar opacity likely reflecting atelectasis. Electronically Signed   By: Logan Bores M.D.   On: 07/04/2020 04:21     Scheduled Meds: . atorvastatin  80 mg Oral Daily  . carvedilol  12.5 mg Oral BID WC  . Chlorhexidine Gluconate Cloth  6 each Topical Daily  . enoxaparin (LOVENOX) injection  40 mg Subcutaneous Q24H  . furosemide  60 mg Intravenous Q12H  . hydrALAZINE  50 mg Oral TID  . insulin aspart  0-15 Units Subcutaneous TID WC  . insulin aspart  0-5 Units Subcutaneous QHS  . isosorbide mononitrate  15 mg Oral Daily  . lisinopril  5 mg Oral Daily  . sodium chloride flush  3 mL Intravenous Once  . timolol  1 drop Both Eyes QPC breakfast   Continuous Infusions: . nitroGLYCERIN 50 mcg/min (07/05/20 1500)    Principal Problem:   Acute on chronic combined systolic and diastolic CHF (congestive heart failure) (H[facility]) Active Problems:   Hypertension   Chronic combined systolic and diastolic congestive heart failure ([facility])   Morbid obesity ([facility])   CKD (chronic kidney disease) stage 2, GFR 60-89 ml/min   Diabetes mellitus type II, controlled ([place])   OSA (obstructive sleep apnea)   Mixed hyperlipidemia   Acute respiratory failure with hypoxia ([place])     Matthew [name], [facility]  If 7PM-7AM, please contact night-coverage [email or web address] Password [email or web address] 07/05/2020, 3:44 PM    LOS: 1 day

## 2020-07-05 NOTE — TOC Initial Note (Signed)
Transition of Care (TOC) - Initial/Assessment Note    Patient Details  Name: Matthew [name]. MRN: [id number] Date of Birth: Mar 15, 1966  Transition of Care [facility]) CM/SW Contact:    Leeroy Cha, RN Phone Number: 07/05/2020, 9:10 AM  Clinical Narrative:                 From home and has pcp Hx fo dia sys chf,on [place] at 4l.min, alert Plan to return to home   Expected Discharge Plan: Home/Self Care Barriers to Discharge: Continued Medical Work up   Patient Goals and CMS Choice Patient states their goals for this hospitalization and ongoing recovery are:: i want to go home CMS Medicare.gov Compare Post Acute Care list provided to:: Patient    Expected Discharge Plan and Services Expected Discharge Plan: Home/Self Care   Discharge Planning Services: CM Consult   Living arrangements for the past 2 months: Apartment                                      Prior Living Arrangements/Services Living arrangements for the past 2 months: Apartment Lives with:: Self Patient language and need for interpreter reviewed:: Yes Do you feel safe going back to the place where you live?: Yes      Need for Family Participation in Patient Care: Yes (Comment) Care giver support system in place?: Yes (comment)   Criminal Activity/Legal Involvement Pertinent to Current Situation/Hospitalization: No - Comment as needed  Activities of Daily Living Home Assistive Devices/Equipment: CPAP ADL Screening (condition at time of admission) Patient's cognitive ability adequate to safely complete daily activities?: No (patient currently intubated) Is the patient deaf or have difficulty hearing?: No Does the patient have difficulty seeing, even when wearing glasses/contacts?: No Does the patient have difficulty concentrating, remembering, or making decisions?: Yes (patient currently intubated) Patient able to express need for assistance with ADLs?: No (patient currently intubated) Does the  patient have difficulty dressing or bathing?: Yes Independently performs ADLs?: No Communication: Dependent (patient currently intubated) Is this a change from baseline?: Change from baseline, expected to last >3 days Dressing (OT): Dependent Is this a change from baseline?: Change from baseline, expected to last >3 days Grooming: Dependent Is this a change from baseline?: Change from baseline, expected to last >3 days Feeding: Dependent Is this a change from baseline?: Change from baseline, expected to last >3 days Bathing: Dependent Is this a change from baseline?: Change from baseline, expected to last >3 days Toileting: Dependent Is this a change from baseline?: Change from baseline, expected to last >3days In/Out Bed: Dependent Is this a change from baseline?: Change from baseline, expected to last >3 days Walks in Home: Dependent Is this a change from baseline?: Change from baseline, expected to last >3 days Does the patient have difficulty walking or climbing stairs?: Yes (patient currently intubated) Weakness of Legs: Both (has left sided weakness from past stroke) Weakness of Arms/Hands: Both  Permission Sought/Granted                  Emotional Assessment Appearance:: Appears stated age Attitude/Demeanor/Rapport: Guarded Affect (typically observed): Angry Orientation: : Oriented to Self, Oriented to Place, Oriented to  Time, Oriented to Situation Alcohol / Substance Use: Not Applicable Psych Involvement: No (comment)  Admission diagnosis:  Acute on chronic combined systolic and diastolic congestive heart failure (HCC) [I50.43] Acute respiratory failure with hypoxia (HCC) [J96.01] Acute on  chronic respiratory failure with hypoxia ([place]) [J96.21] Patient Active Problem List   Diagnosis Date Noted  . Acute respiratory failure with hypoxia ([place]) 02/15/2020  . Acute on chronic combined systolic and diastolic CHF (congestive heart failure) ([place]) 02/15/2020  .  Hypertensive urgency 02/15/2020  . CHF (congestive heart failure) ([place]) 02/15/2020  . 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 Expressive aphasia 02/20/2017  . Mixed hyperlipidemia   . History of stroke 01/31/2017  . Erectile dysfunction 07/30/2016  . OSA (obstructive sleep apnea) 05/10/2015  . CKD (chronic kidney disease) stage 2, GFR 60-89 ml/min 03/10/2014  . Diabetes mellitus type II, controlled ([place]) 03/10/2014  . Chronic combined systolic and diastolic congestive heart failure ([place]) 03/03/2014  . Smoker   . Morbid obesity ([place])   . Hypertension 03/02/2014   PCP:  Charlott Rakes, MD Pharmacy:   [facility], [address] [place] [address] Phone: [phone] Fax: [phone]     Social Determinants of Health (SDOH) Interventions    Readmission Risk Interventions No flowsheet data found.

## 2020-07-05 NOTE — Plan of Care (Addendum)
Patient refused CPAP (see note from respiratory therapist). Slept most of evening, now awake eating snacks. Patient remains on oxygen via [place]. One prn dose Hydralazine given thus far this shift.   Problem: Education: Goal: Knowledge of General Education information will improve Description: Including pain rating scale, medication(s)/side effects and non-pharmacologic comfort measures Outcome: Progressing   Problem: Health Behavior/Discharge Planning: Goal: Ability to manage health-related needs will improve Outcome: Progressing   Problem: Clinical Measurements: Goal: Ability to maintain clinical measurements within normal limits will improve Outcome: Progressing Goal: Will remain free from infection Outcome: Progressing Goal: Diagnostic test results will improve Outcome: Progressing Goal: Respiratory complications will improve Outcome: Progressing Goal: Cardiovascular complication will be avoided Outcome: Progressing   Problem: Activity: Goal: Risk for activity intolerance will decrease Outcome: Progressing   Problem: Nutrition: Goal: Adequate nutrition will be maintained Outcome: Progressing   Problem: Coping: Goal: Level of anxiety will decrease Outcome: Progressing   Problem: Elimination: Goal: Will not experience complications related to bowel motility Outcome: Progressing Goal: Will not experience complications related to urinary retention Outcome: Progressing   Problem: Pain Managment: Goal: General experience of comfort will improve Outcome: Progressing   Problem: Safety: Goal: Ability to remain free from injury will improve Outcome: Progressing   Problem: Skin Integrity: Goal: Risk for impaired skin integrity will decrease Outcome: Progressing   Problem: Education: Goal: Ability to demonstrate management of disease process will improve Outcome: Progressing Goal: Ability to verbalize understanding of medication therapies will improve Outcome:  Progressing Goal: Individualized Educational Video(s) Outcome: Progressing   Problem: Activity: Goal: Capacity to carry out activities will improve Outcome: Progressing   Problem: Cardiac: Goal: Ability to achieve and maintain adequate cardiopulmonary perfusion will improve Outcome: Progressing

## 2020-07-05 NOTE — Progress Notes (Signed)
Pt refused CPAP-QHS.  Pt encouraged to contact RT should he change his mind.

## 2020-07-05 NOTE — Progress Notes (Signed)
Restarted Nitroglycerin drip within the hour for elevated blood pressures. Also medicated patient for headache. Patient stated all the wires attached to him were causing him to feel bad rather than his elevated blood pressure. Patient became agitated and spoke rudely to phlebotomist and RN as he refused venipuncture for routine morning labs. He states they "always draw from this" as he points to peripheral IV. Attempted to educate patient, but he refused and dismissed staff from room. On call provider, M. Sharlet Salina, NP and charge RN informed.

## 2020-07-06 DIAGNOSIS — I160 Hypertensive urgency: Secondary | ICD-10-CM

## 2020-07-06 DIAGNOSIS — E1121 Type 2 diabetes mellitus with diabetic nephropathy: Secondary | ICD-10-CM

## 2020-07-06 DIAGNOSIS — G4733 Obstructive sleep apnea (adult) (pediatric): Secondary | ICD-10-CM

## 2020-07-06 LAB — GLUCOSE, CAPILLARY: Glucose-Capillary: 186 mg/dL — ABNORMAL HIGH (ref 70–99)

## 2020-07-06 NOTE — Discharge Summary (Signed)
Physician Discharge Summary  Matthew [name]. ALP:[id number] DOB: Apr 05, 1966 DOA: 07/04/2020  PCP: Charlott Rakes, MD  Admit date: 07/04/2020 Discharge date: 07/06/2020  Recommendations for Outpatient Follow-up:  1. Follow-up CHF, hypertension   Follow-up Information    Charlott Rakes, MD. Schedule an appointment as soon as possible for a visit in 1 week(s).   Specialty: Family Medicine Contact information: [place] [id number] [phone]                Discharge Diagnoses: Principal diagnosis is #1 1. Acute hypoxic respiratory failure secondary to decompensated combined systolic and diastolic CHF 2. Hypertensive urgency treated with nitroglycerin on admission 3. Mild troponin elevation secondary to hypertensive urgency 4. Diabetes mellitus type 2 with CKD stage II. Hgb A1c 6.6. 5. OSA   Discharge Condition: improved Disposition: home  Diet recommendation: heart healthy, diabetic diet  Filed Weights   07/04/20 1256 07/05/20 0400 07/06/20 0447  Weight: 99 kg 97.5 kg 96.9 kg    History of present illness:  33yom PMH combined systolic/diastolic CHF, DM, admited 7/19 with acute hypoxic resp failure.  Admitted for same secondary to acute CHF, as well as hypertensive urgency.  Also required BiPAP in the emergency department  Hospital Course:  Patient was treated with nitroglycerin infusion and IV diuresis with gradual clinical improvement, resolution of hypoxemia and improvement in blood pressure.  Substantial diuresis, now appears euvolemic.  Off oxygen.  Precipitating event probably related to poor compliance with medications dealing with some grief at home since the death of his wife.  He has all his medications at home and will understands taking medications properly should help prevent exacerbations.  Has been drinking more water given the heat.  Discussed the need for diuretic with him.  He does not eat salt.  Acute hypoxic respiratory  failure secondary to decompensated combined systolic and diastolic CHF.  EF 40 to 40% from 2020-03-19 echocardiogram --secondary to noncompliance with medications, associated with grief from death of his wife in Mar 20, 2023 --Hypoxia now resolved.  Stable for discharge. --UOP 3075, I/O -5.7 L since admit. Wgts 99kg > 96.9 kg. --Continue beta-blocker, ACE inhibitor, hydralazine  Hypertensive urgency treated with nitroglycerin on admission -- Resolved.  Secondary to acute CHF.  Continue ACE inhibitor, hydralazine and beta-blocker.  Mild troponin elevation secondary to hypertensive urgency --levels c/w 02/15/20 levels when admitted for similar dxs. No further evaluation suggested  Diabetes mellitus type 2 with CKD stage II. Hgb A1c 6.6. --CBG stable --Creatinine stable  OSA --pt refusing inpt CPAP machine, cannot get his machine from home  Chart review 03-19-20 similar admission dxs, left AMA  Today's assessment: S:  Refusing AM labs per RN and irritable this AM per RN. Refused CPAP 7/19 PM and RN notes pt was agitated and spoke rudely to staff.  Feels better, breathing back to normal (has some SOB at home at times). Confirms "got off track" w/ home meds, now a widower w/ death of wife in 03-20-2023. 2 teenagers at home. Has a scale and can weigh self daily. Doesn't eat salt. Has all meds at home. O: Vitals:  Vitals:   07/06/20 0917 07/06/20 0927  BP:  (!) 149/72  Pulse:  83  Resp:  19  Temp: 97.6 F (36.4 C)   SpO2:  100%    Constitutional:  . Appears calm and comfortable Respiratory:  . CTA bilaterally, no w/r/r.  . Respiratory effort normal.  Cardiovascular:  . RRR, no m/r/g . No LE extremity  edema   . Telemetry SR Psychiatric:  . judgement and insight appear normal . Mental status o Mood, affect appropriate  No new labs today.  Chart reviewed.  Discharge Instructions  Discharge Instructions    (HEART FAILURE PATIENTS) Call MD:  Anytime you have any of the following  symptoms: 1) 3 pound weight gain in 24 hours or 5 pounds in 1 week 2) shortness of breath, with or without a dry hacking cough 3) swelling in the hands, feet or stomach 4) if you have to sleep on extra pillows at night in order to breathe.   Complete by: As directed    Diet - low sodium heart healthy   Complete by: As directed    Diet Carb Modified   Complete by: As directed    Discharge instructions   Complete by: As directed    Call your physician or seek immediate medical attention for weight gain, shortness of breath, swelling or worsening of condition.   Increase activity slowly   Complete by: As directed      Allergies as of 07/06/2020   No Known Allergies     Medication List    STOP taking these medications   ibuprofen 800 MG tablet Commonly known as: ADVIL     TAKE these medications   acetaminophen 500 MG tablet Commonly known as: TYLENOL Take 500 mg by mouth daily as needed for mild pain or moderate pain.   aspirin 81 MG tablet Take 1 tablet (81 mg total) by mouth daily.   atorvastatin 80 MG tablet Commonly known as: LIPITOR Take 1 tablet (80 mg total) by mouth daily.   carvedilol 12.5 MG tablet Commonly known as: COREG Take 1 tablet (12.5 mg total) by mouth 2 (two) times daily with a meal. Must have office visit for refills   ezetimibe 10 MG tablet Commonly known as: ZETIA Take 1 tablet (10 mg total) by mouth daily.   FLUoxetine 20 MG tablet Commonly known as: PROZAC Take 1 tablet (20 mg total) by mouth daily.   furosemide 40 MG tablet Commonly known as: LASIX Take 1 tablet (40 mg total) by mouth daily.   glipiZIDE 5 MG tablet Commonly known as: GLUCOTROL Take 1 tablet (5 mg total) by mouth 2 (two) times daily before a meal. Must have office visit for refills   glucose blood test strip Commonly known as: ONE TOUCH ULTRA TEST Use as instructed   hydrALAZINE 50 MG tablet Commonly known as: APRESOLINE Take 1 tablet (50 mg total) by mouth 3 (three)  times daily.   lisinopril 5 MG tablet Commonly known as: ZESTRIL Take 1 tablet (5 mg total) by mouth daily.   timolol 0.5 % ophthalmic solution Commonly known as: TIMOPTIC Place 1 drop into both eyes daily after breakfast.      No Known Allergies  The results of significant diagnostics from this hospitalization (including imaging, microbiology, ancillary and laboratory) are listed below for reference.    Significant Diagnostic Studies: DG Chest Portable 1 View  Result Date: 07/04/2020 CLINICAL DATA:  Shortness of breath.  Respiratory distress. EXAM: PORTABLE CHEST 1 VIEW COMPARISON:  02/15/2020 FINDINGS: The cardiac silhouette remains enlarged. There is improved aeration of the lungs compared to the prior study without significant residual vascular congestion. Mild opacity is present in the left lung base. No sizable pleural effusion or pneumothorax is identified. No acute osseous abnormality is seen. IMPRESSION: Improved lung aeration. Mild left basilar opacity likely reflecting atelectasis. Electronically Signed   By: Zenia Resides  Jeralyn Ruths M.D.   On: 07/04/2020 04:21    Microbiology: Recent Results (from the past 240 hour(s))  SARS Coronavirus 2 by RT PCR (hospital order, performed in [facility] hospital lab) Nasopharyngeal Nasopharyngeal Swab     Status: None   Collection Time: 07/04/20  3:44 AM   Specimen: Nasopharyngeal Swab  Result Value Ref Range Status   SARS Coronavirus 2 NEGATIVE NEGATIVE Final    Comment: (NOTE) SARS-CoV-2 target nucleic acids are NOT DETECTED.  The SARS-CoV-2 RNA is generally detectable in upper and lower respiratory specimens during the acute phase of infection. The lowest concentration of SARS-CoV-2 viral copies this assay can detect is 250 copies / mL. A negative result does not preclude SARS-CoV-2 infection and should not be used as the sole basis for treatment or other patient management decisions.  A negative result may occur with improper specimen  collection / handling, submission of specimen other than nasopharyngeal swab, presence of viral mutation(s) within the areas targeted by this assay, and inadequate number of viral copies (<250 copies / mL). A negative result must be combined with clinical observations, patient history, and epidemiological information.  Fact Sheet for Patients:   StrictlyIdeas.no  Fact Sheet for Healthcare Providers: BankingDealers.co.za  This test is not yet approved or  cleared by the [place] FDA and has been authorized for detection and/or diagnosis of SARS-CoV-2 by FDA under an Emergency Use Authorization (EUA).  This EUA will remain in effect (meaning this test can be used) for the duration of the COVID-19 declaration under Section 564(b)(1) of the Act, 21 U.S.C. section 360bbb-3(b)(1), unless the authorization is terminated or revoked sooner.  Performed at [facility], [place] [address], [place], [place] [address]   MRSA PCR Screening     Status: None   Collection Time: 07/04/20 11:05 AM   Specimen: Nasopharyngeal  Result Value Ref Range Status   MRSA by PCR NEGATIVE NEGATIVE Final    Comment:        The GeneXpert MRSA Assay (FDA approved for NASAL specimens only), is one component of a comprehensive MRSA colonization surveillance program. It is not intended to diagnose MRSA infection nor to guide or monitor treatment for MRSA infections. Performed at [facility], [place] [address], [place], [place] [address]      Labs: Basic Metabolic Panel: Recent Labs  Lab 07/04/20 0337 07/04/20 1003 07/05/20 0656  NA 137  --  137  K 3.9  --  3.2*  CL 102  --  99  CO2 22  --  28  GLUCOSE 242*  --  203*  BUN 26*  --  18  CREATININE 1.67* 1.65* 1.49*  CALCIUM 8.8*  --  9.0   CBC: Recent Labs  Lab 07/04/20 0337 07/04/20 1003 07/05/20 0656  WBC 6.0 6.8 7.7  HGB 15.4 15.0 15.5  HCT 47.6 44.6 47.4    MCV 93.0 90.8 92.2  PLT 223 231 229    Recent Labs    02/15/20 0142 07/04/20 0344  BNP 258.0* 264.4*   CBG: Recent Labs  Lab 07/04/20 1137 07/04/20 2129 07/06/20 0808  GLUCAP 165* 168* 186*    Principal Problem:   Acute on chronic combined systolic and diastolic CHF (congestive heart failure) (HCC) Active Problems:   Hypertension   Chronic combined systolic and diastolic congestive heart failure (HCC)   Morbid obesity (HCC)   CKD (chronic kidney disease) stage 2, GFR 60-89 ml/min   Diabetes mellitus type II, controlled ([place])  OSA (obstructive sleep apnea)   Mixed hyperlipidemia   Acute respiratory failure with hypoxia ([place])   Time coordinating discharge: 35 minutes  Signed:  Murray Hodgkins, MD  [facility]  07/06/2020, 10:35 AM

## 2020-07-06 NOTE — Progress Notes (Signed)
RN entered room and pt very agitated, irritable, pulling off equipment, reports he wants to leave immediately, refused all AM labs, verbally aggressive/profanity noted, adamantly trying to get out of bed to go home. Security paged, MD notified of situation. Reassured patient that I would notify MD and discuss possible d/c vs patient leaving AMA.  Able to calm patient down at this time, agreed to brush teeth at sink and get bath prior to breakfast arriving. Pt agreed to eat breakfast and wait on MD to discuss discharge. Pt A+Ox4 during interaction, cooperative at end of interaction, denied pain. VSS.

## 2020-07-06 NOTE — Progress Notes (Signed)
   07/06/20 1100  Clinical Encounter Type  Visited With Patient  Visit Type Initial;Psychological support;Spiritual support  Referral From Nurse  Consult/Referral To Chaplain  Spiritual Encounters  Spiritual Needs Emotional;Other (Comment) (Clothing )  Stress Factors  Patient Stress Factors Other (Comment) (Clothing )    I provided Matthew [name] with clothes for discharge.   Please, contact Spiritual Care for further assistance.   Chaplain Shanon Ace M.Div., [facility]

## 2020-07-06 NOTE — Plan of Care (Signed)
Discharge education reviewed with patient

## 2020-07-06 NOTE — TOC Transition Note (Signed)
Transition of Care [facility]) - CM/SW Discharge Note   Patient Details  Name: Matthew [name]. MRN: [id number] Date of Birth: May 15, 1966  [facility]) CM/SW Contact:  Leeroy Cha, RN Phone Number: 07/06/2020, 2:02 PM   Clinical Narrative:    Patient states that he can afford his medications and does not need assistance.   Final next level of care: Home/Self Care Barriers to Discharge: Barriers Resolved   Patient Goals and CMS Choice Patient states their goals for this hospitalization and ongoing recovery are:: i want to go home CMS Medicare.gov Compare Post Acute Care list provided to:: Patient    Discharge Placement                       Discharge Plan and Services   Discharge Planning Services: CM Consult                                 Social Determinants of Health (SDOH) Interventions     Readmission Risk Interventions No flowsheet data found.

## 2020-07-06 NOTE — Progress Notes (Signed)
Patient refusing care and blood draw this morning. Patient is irritable at this time. Will continue care and monitoring of the patient.

## 2020-07-07 ENCOUNTER — Telehealth: Payer: Self-pay

## 2020-07-07 NOTE — Telephone Encounter (Signed)
Transition Care Management Follow-up Telephone Call Date of discharge and from where: 07/06/2020, [facility]  Call placed to patient. He said he was eating and would call this CM back.

## 2020-07-08 ENCOUNTER — Telehealth: Payer: Self-pay

## 2020-07-08 NOTE — Telephone Encounter (Signed)
Transition Care Management Follow-up Telephone Call Date of discharge and from where: 07/06/2020, [facility]  Call placed to patient. He said he cannot talk to much at this time but to schedule his F /U appt/ Unable to have a thorough F /u as pt was unavailable to further talk. App schedule to F /u with PCP on August 3 at 10:50.  Asked pt if possible to return our call when possible.  Verbalized understanding

## 2020-07-12 LAB — GLUCOSE, CAPILLARY
Glucose-Capillary: 170 mg/dL — ABNORMAL HIGH (ref 70–99)
Glucose-Capillary: 172 mg/dL — ABNORMAL HIGH (ref 70–99)

## 2020-07-19 ENCOUNTER — Ambulatory Visit: Payer: Self-pay | Attending: Family Medicine | Admitting: Family Medicine

## 2020-07-19 ENCOUNTER — Other Ambulatory Visit: Payer: Self-pay

## 2020-08-29 MED FILL — glipiZIDE 5 MG TABS: 5 | 30 days supply | Qty: 60 | Fill #4

## 2020-08-29 MED FILL — CARVEDILOL 12.5 MG TABLET: 12.5 | 30 days supply | Qty: 60 | Fill #4

## 2020-09-23 MED FILL — hydrALAZINE HCL 50 MG TABS: 50 | 30 days supply | Qty: 90 | Fill #3

## 2020-09-23 MED FILL — FUROSEMIDE 40 MG TAB: 40 | 30 days supply | Qty: 30 | Fill #3

## 2020-10-03 ENCOUNTER — Emergency Department (HOSPITAL_COMMUNITY): Payer: Medicaid Other

## 2020-10-03 ENCOUNTER — Encounter (HOSPITAL_COMMUNITY): Payer: Self-pay

## 2020-10-03 ENCOUNTER — Emergency Department (HOSPITAL_COMMUNITY)
Admission: EM | Admit: 2020-10-03 | Discharge: 2020-10-03 | Disposition: A | Discharge status: Home / Self Care | Payer: Medicaid Other | Attending: Emergency Medicine | Admitting: Emergency Medicine

## 2020-10-03 DIAGNOSIS — R079 Chest pain, unspecified: Secondary | ICD-10-CM | POA: Insufficient documentation

## 2020-10-03 DIAGNOSIS — Z5321 Procedure and treatment not carried out due to patient leaving prior to being seen by health care provider: Secondary | ICD-10-CM | POA: Insufficient documentation

## 2020-10-03 LAB — CBC
HCT: 47.4 % (ref 39.0–52.0)
Hemoglobin: 16.1 g/dL (ref 13.0–17.0)
MCH: 30.4 pg (ref 26.0–34.0)
MCHC: 34 g/dL (ref 30.0–36.0)
MCV: 89.4 fL (ref 80.0–100.0)
Platelets: 224 10*3/uL (ref 150–400)
RBC: 5.3 MIL/uL (ref 4.22–5.81)
RDW: 13 % (ref 11.5–15.5)
WBC: 4.5 10*3/uL (ref 4.0–10.5)
nRBC: 0 % (ref 0.0–0.2)

## 2020-10-03 LAB — BASIC METABOLIC PANEL
Anion gap: 8 (ref 5–15)
BUN: 14 mg/dL (ref 6–20)
CO2: 25 mmol/L (ref 22–32)
Calcium: 9 mg/dL (ref 8.9–10.3)
Chloride: 105 mmol/L (ref 98–111)
Creatinine, Ser: 1.38 mg/dL — ABNORMAL HIGH (ref 0.61–1.24)
GFR, Estimated: 58 mL/min — ABNORMAL LOW (ref >60)
Glucose, Bld: 151 mg/dL — ABNORMAL HIGH (ref 70–99)
Potassium: 3.2 mmol/L — ABNORMAL LOW (ref 3.5–5.1)
Sodium: 138 mmol/L (ref 135–145)

## 2020-10-03 LAB — TROPONIN I (HIGH SENSITIVITY): Troponin I (High Sensitivity): 21 ng/L — ABNORMAL HIGH (ref <18)

## 2020-10-03 NOTE — ED Triage Notes (Signed)
Pt bib ems for chest pain since his car broke down last night. Pt took 324 ASA prior to EMS arrival, no other symptoms. Pt a.o, resp e.u

## 2020-10-04 ENCOUNTER — Ambulatory Visit: Payer: Self-pay | Admitting: Family Medicine

## 2020-10-13 MED FILL — CARVEDILOL 12.5 MG TABLET: 12.5 | 30 days supply | Qty: 60 | Fill #5

## 2020-10-13 MED FILL — glipiZIDE 5 MG TABS: 5 | 30 days supply | Qty: 60 | Fill #5

## 2020-10-13 MED FILL — ATORVASTATIN CALCIUM 80 MG: 80 | 30 days supply | Qty: 30 | Fill #3

## 2020-10-19 MED FILL — FUROSEMIDE 40 MG TAB: 40 | 30 days supply | Qty: 30 | Fill #4

## 2020-10-27 MED FILL — CARVEDILOL 12.5 MG TABLET: 12.5 | 30 days supply | Qty: 60 | Fill #5

## 2020-10-27 MED FILL — glipiZIDE 5 MG TABS: 5 | 30 days supply | Qty: 60 | Fill #5

## 2020-10-27 MED FILL — FUROSEMIDE 40 MG TAB: 40 | 30 days supply | Qty: 30 | Fill #4

## 2020-11-07 MED FILL — FUROSEMIDE 40 MG TAB: 40 | 30 days supply | Qty: 30 | Fill #4

## 2020-11-07 MED FILL — glipiZIDE 5 MG TABS: 5 | 30 days supply | Qty: 60 | Fill #5

## 2020-12-13 ENCOUNTER — Other Ambulatory Visit: Payer: Self-pay | Admitting: Family Medicine

## 2020-12-13 DIAGNOSIS — I5042 Chronic combined systolic (congestive) and diastolic (congestive) heart failure: Secondary | ICD-10-CM

## 2020-12-13 DIAGNOSIS — I10 Essential (primary) hypertension: Secondary | ICD-10-CM

## 2020-12-13 DIAGNOSIS — E1121 Type 2 diabetes mellitus with diabetic nephropathy: Secondary | ICD-10-CM

## 2020-12-13 MED FILL — hydrALAZINE HCL 50 MG TABS: 50 | 30 days supply | Qty: 90 | Fill #0

## 2020-12-13 MED FILL — FUROSEMIDE 40 MG TAB: 40 | 30 days supply | Qty: 30 | Fill #0

## 2020-12-13 MED FILL — glipiZIDE 5 MG TABS: 5 | 30 days supply | Qty: 60 | Fill #0

## 2020-12-13 NOTE — Telephone Encounter (Signed)
Pt called and is requesting to have his medications refilled until he can get an appt with PCP on 02/06/21. He states that he would like to wait and make PCP aware that he is out of these medications. Please advise.

## 2020-12-24 ENCOUNTER — Inpatient Hospital Stay (HOSPITAL_COMMUNITY)
Admission: EM | Admit: 2020-12-24 | Discharge: 2021-01-03 | DRG: 291 | Disposition: A | Discharge status: Other Acute Inpatient Hospital | Payer: Self-pay | Attending: Internal Medicine | Admitting: Internal Medicine

## 2020-12-24 ENCOUNTER — Emergency Department (HOSPITAL_COMMUNITY): Payer: Self-pay

## 2020-12-24 ENCOUNTER — Encounter (HOSPITAL_COMMUNITY): Payer: Self-pay | Admitting: Emergency Medicine

## 2020-12-24 ENCOUNTER — Inpatient Hospital Stay (HOSPITAL_COMMUNITY): Payer: Self-pay

## 2020-12-24 ENCOUNTER — Other Ambulatory Visit: Payer: Self-pay

## 2020-12-24 DIAGNOSIS — Z8249 Family history of ischemic heart disease and other diseases of the circulatory system: Secondary | ICD-10-CM

## 2020-12-24 DIAGNOSIS — Z6833 Body mass index (BMI) 33.0-33.9, adult: Secondary | ICD-10-CM

## 2020-12-24 DIAGNOSIS — N182 Chronic kidney disease, stage 2 (mild): Secondary | ICD-10-CM | POA: Diagnosis present

## 2020-12-24 DIAGNOSIS — E876 Hypokalemia: Secondary | ICD-10-CM | POA: Diagnosis not present

## 2020-12-24 DIAGNOSIS — Z8049 Family history of malignant neoplasm of other genital organs: Secondary | ICD-10-CM

## 2020-12-24 DIAGNOSIS — I428 Other cardiomyopathies: Secondary | ICD-10-CM | POA: Diagnosis present

## 2020-12-24 DIAGNOSIS — Z8673 Personal history of transient ischemic attack (TIA), and cerebral infarction without residual deficits: Secondary | ICD-10-CM

## 2020-12-24 DIAGNOSIS — R079 Chest pain, unspecified: Secondary | ICD-10-CM

## 2020-12-24 DIAGNOSIS — Z9114 Patient's other noncompliance with medication regimen: Secondary | ICD-10-CM

## 2020-12-24 DIAGNOSIS — Z7984 Long term (current) use of oral hypoglycemic drugs: Secondary | ICD-10-CM

## 2020-12-24 DIAGNOSIS — G9389 Other specified disorders of brain: Secondary | ICD-10-CM | POA: Diagnosis present

## 2020-12-24 DIAGNOSIS — I130 Hypertensive heart and chronic kidney disease with heart failure and stage 1 through stage 4 chronic kidney disease, or unspecified chronic kidney disease: Principal | ICD-10-CM | POA: Diagnosis present

## 2020-12-24 DIAGNOSIS — Z23 Encounter for immunization: Secondary | ICD-10-CM

## 2020-12-24 DIAGNOSIS — Z79899 Other long term (current) drug therapy: Secondary | ICD-10-CM

## 2020-12-24 DIAGNOSIS — N289 Disorder of kidney and ureter, unspecified: Secondary | ICD-10-CM

## 2020-12-24 DIAGNOSIS — E1122 Type 2 diabetes mellitus with diabetic chronic kidney disease: Secondary | ICD-10-CM | POA: Diagnosis present

## 2020-12-24 DIAGNOSIS — Z20822 Contact with and (suspected) exposure to covid-19: Secondary | ICD-10-CM | POA: Diagnosis present

## 2020-12-24 DIAGNOSIS — E782 Mixed hyperlipidemia: Secondary | ICD-10-CM | POA: Diagnosis present

## 2020-12-24 DIAGNOSIS — I493 Ventricular premature depolarization: Secondary | ICD-10-CM | POA: Diagnosis not present

## 2020-12-24 DIAGNOSIS — I161 Hypertensive emergency: Secondary | ICD-10-CM | POA: Diagnosis present

## 2020-12-24 DIAGNOSIS — R519 Headache, unspecified: Secondary | ICD-10-CM | POA: Diagnosis not present

## 2020-12-24 DIAGNOSIS — F32A Depression, unspecified: Secondary | ICD-10-CM | POA: Diagnosis present

## 2020-12-24 DIAGNOSIS — I5043 Acute on chronic combined systolic (congestive) and diastolic (congestive) heart failure: Secondary | ICD-10-CM | POA: Diagnosis present

## 2020-12-24 DIAGNOSIS — F419 Anxiety disorder, unspecified: Secondary | ICD-10-CM | POA: Diagnosis present

## 2020-12-24 DIAGNOSIS — Z7982 Long term (current) use of aspirin: Secondary | ICD-10-CM

## 2020-12-24 DIAGNOSIS — I5082 Biventricular heart failure: Secondary | ICD-10-CM | POA: Diagnosis present

## 2020-12-24 DIAGNOSIS — Z66 Do not resuscitate: Secondary | ICD-10-CM | POA: Diagnosis present

## 2020-12-24 DIAGNOSIS — T465X5A Adverse effect of other antihypertensive drugs, initial encounter: Secondary | ICD-10-CM | POA: Diagnosis not present

## 2020-12-24 DIAGNOSIS — I160 Hypertensive urgency: Secondary | ICD-10-CM | POA: Diagnosis present

## 2020-12-24 DIAGNOSIS — R45851 Suicidal ideations: Secondary | ICD-10-CM | POA: Diagnosis present

## 2020-12-24 DIAGNOSIS — R0602 Shortness of breath: Secondary | ICD-10-CM

## 2020-12-24 DIAGNOSIS — I509 Heart failure, unspecified: Secondary | ICD-10-CM

## 2020-12-24 DIAGNOSIS — R5383 Other fatigue: Secondary | ICD-10-CM | POA: Diagnosis present

## 2020-12-24 LAB — I-STAT ARTERIAL BLOOD GAS, ED
Acid-Base Excess: 2 mmol/L (ref 0.0–2.0)
Bicarbonate: 26.9 mmol/L (ref 20.0–28.0)
Calcium, Ion: 1.24 mmol/L (ref 1.15–1.40)
HCT: 45 % (ref 39.0–52.0)
Hemoglobin: 15.3 g/dL (ref 13.0–17.0)
O2 Saturation: 97 %
Patient temperature: 98.3
Potassium: 3.1 mmol/L — ABNORMAL LOW (ref 3.5–5.1)
Sodium: 142 mmol/L (ref 135–145)
TCO2: 28 mmol/L (ref 22–32)
pCO2 arterial: 40.7 mmHg (ref 32.0–48.0)
pH, Arterial: 7.427 (ref 7.350–7.450)
pO2, Arterial: 90 mmHg (ref 83.0–108.0)

## 2020-12-24 LAB — CBC WITH DIFFERENTIAL/PLATELET
Abs Immature Granulocytes: 0.02 10*3/uL (ref 0.00–0.07)
Basophils Absolute: 0 10*3/uL (ref 0.0–0.1)
Basophils Relative: 0 %
Eosinophils Absolute: 0.1 10*3/uL (ref 0.0–0.5)
Eosinophils Relative: 1 %
HCT: 46.6 % (ref 39.0–52.0)
Hemoglobin: 16.2 g/dL (ref 13.0–17.0)
Immature Granulocytes: 0 %
Lymphocytes Relative: 20 %
Lymphs Abs: 1.7 10*3/uL (ref 0.7–4.0)
MCH: 30.8 pg (ref 26.0–34.0)
MCHC: 34.8 g/dL (ref 30.0–36.0)
MCV: 88.6 fL (ref 80.0–100.0)
Monocytes Absolute: 0.7 10*3/uL (ref 0.1–1.0)
Monocytes Relative: 8 %
Neutro Abs: 6 10*3/uL (ref 1.7–7.7)
Neutrophils Relative %: 71 %
Platelets: 210 10*3/uL (ref 150–400)
RBC: 5.26 MIL/uL (ref 4.22–5.81)
RDW: 12.9 % (ref 11.5–15.5)
WBC: 8.5 10*3/uL (ref 4.0–10.5)
nRBC: 0 % (ref 0.0–0.2)

## 2020-12-24 LAB — TROPONIN I (HIGH SENSITIVITY)
Troponin I (High Sensitivity): 16 ng/L (ref <18)
Troponin I (High Sensitivity): 19 ng/L — ABNORMAL HIGH (ref <18)

## 2020-12-24 LAB — I-STAT VENOUS BLOOD GAS, ED
Acid-Base Excess: 1 mmol/L (ref 0.0–2.0)
Bicarbonate: 25.8 mmol/L (ref 20.0–28.0)
Calcium, Ion: 1.13 mmol/L — ABNORMAL LOW (ref 1.15–1.40)
HCT: 48 % (ref 39.0–52.0)
Hemoglobin: 16.3 g/dL (ref 13.0–17.0)
O2 Saturation: 100 %
Potassium: 3.5 mmol/L (ref 3.5–5.1)
Sodium: 142 mmol/L (ref 135–145)
TCO2: 27 mmol/L (ref 22–32)
pCO2, Ven: 42 mmHg — ABNORMAL LOW (ref 44.0–60.0)
pH, Ven: 7.396 (ref 7.250–7.430)
pO2, Ven: 168 mmHg — ABNORMAL HIGH (ref 32.0–45.0)

## 2020-12-24 LAB — COMPREHENSIVE METABOLIC PANEL
ALT: 37 U/L (ref 0–44)
AST: 22 U/L (ref 15–41)
Albumin: 3.6 g/dL (ref 3.5–5.0)
Alkaline Phosphatase: 64 U/L (ref 38–126)
Anion gap: 11 (ref 5–15)
BUN: 8 mg/dL (ref 6–20)
CO2: 23 mmol/L (ref 22–32)
Calcium: 9 mg/dL (ref 8.9–10.3)
Chloride: 107 mmol/L (ref 98–111)
Creatinine, Ser: 1.41 mg/dL — ABNORMAL HIGH (ref 0.61–1.24)
GFR, Estimated: 60 mL/min — ABNORMAL LOW (ref >60)
Glucose, Bld: 126 mg/dL — ABNORMAL HIGH (ref 70–99)
Potassium: 3.6 mmol/L (ref 3.5–5.1)
Sodium: 141 mmol/L (ref 135–145)
Total Bilirubin: 1 mg/dL (ref 0.3–1.2)
Total Protein: 6.6 g/dL (ref 6.5–8.1)

## 2020-12-24 LAB — CBG MONITORING, ED: Glucose-Capillary: 119 mg/dL — ABNORMAL HIGH (ref 70–99)

## 2020-12-24 LAB — BRAIN NATRIURETIC PEPTIDE: B Natriuretic Peptide: 647.2 pg/mL — ABNORMAL HIGH (ref 0.0–100.0)

## 2020-12-24 LAB — SARS CORONAVIRUS 2 (TAT 6-24 HRS): SARS Coronavirus 2: NEGATIVE

## 2020-12-24 LAB — ECHOCARDIOGRAM COMPLETE
Area-P 1/2: 2.76 cm2
Height: 69 in
S' Lateral: 6.1 cm
Weight: 3597.91 oz

## 2020-12-24 MED ORDER — POTASSIUM CHLORIDE CRYS ER 20 MEQ PO TBCR
40.0000 meq | EXTENDED_RELEASE_TABLET | Freq: Two times a day (BID) | ORAL | Status: AC
  Administered 2020-12-24 (×2): 40 meq via ORAL
  Filled 2020-12-24 (×2): qty 2

## 2020-12-24 MED ORDER — ACETAMINOPHEN 325 MG PO TABS
650.0000 mg | ORAL_TABLET | Freq: Four times a day (QID) | ORAL | Status: DC | PRN
  Administered 2020-12-24 – 2021-01-02 (×17): 650 mg via ORAL
  Filled 2020-12-24 (×17): qty 2

## 2020-12-24 MED ORDER — EZETIMIBE 10 MG PO TABS
10.0000 mg | ORAL_TABLET | Freq: Every day | ORAL | Status: DC
  Administered 2020-12-24 – 2021-01-03 (×11): 10 mg via ORAL
  Filled 2020-12-24 (×11): qty 1

## 2020-12-24 MED ORDER — ACETAMINOPHEN 650 MG RE SUPP: 650.0000 mg | Freq: Four times a day (QID) | RECTAL | Status: DC | PRN

## 2020-12-24 MED ORDER — ASPIRIN 81 MG PO CHEW
324.0000 mg | CHEWABLE_TABLET | Freq: Once | ORAL | Status: AC
  Administered 2020-12-24: 324 mg via ORAL
  Filled 2020-12-24: qty 4

## 2020-12-24 MED ORDER — FLUOXETINE HCL 20 MG PO CAPS
20.0000 mg | ORAL_CAPSULE | Freq: Every day | ORAL | Status: DC
  Administered 2020-12-24 – 2020-12-25 (×2): 20 mg via ORAL
  Filled 2020-12-24 (×2): qty 1

## 2020-12-24 MED ORDER — ATORVASTATIN CALCIUM 80 MG PO TABS
80.0000 mg | ORAL_TABLET | Freq: Every day | ORAL | Status: DC
  Administered 2020-12-24 – 2021-01-03 (×11): 80 mg via ORAL
  Filled 2020-12-24 (×4): qty 1
  Filled 2020-12-24: qty 2
  Filled 2020-12-24 (×6): qty 1

## 2020-12-24 MED ORDER — FUROSEMIDE 10 MG/ML IJ SOLN
40.0000 mg | Freq: Once | INTRAMUSCULAR | Status: AC
  Administered 2020-12-24: 40 mg via INTRAVENOUS
  Filled 2020-12-24: qty 4

## 2020-12-24 MED ORDER — NITROGLYCERIN 2 % TD OINT
1.0000 [in_us] | TOPICAL_OINTMENT | Freq: Once | TRANSDERMAL | Status: AC
  Administered 2020-12-24: 1 [in_us] via TOPICAL
  Filled 2020-12-24: qty 1

## 2020-12-24 MED ORDER — POLYETHYLENE GLYCOL 3350 17 G PO PACK: 17.0000 g | PACK | Freq: Every day | ORAL | Status: DC | PRN

## 2020-12-24 MED ORDER — ENOXAPARIN SODIUM 40 MG/0.4ML ~~LOC~~ SOLN
40.0000 mg | SUBCUTANEOUS | Status: DC
  Administered 2020-12-24 – 2021-01-03 (×11): 40 mg via SUBCUTANEOUS
  Filled 2020-12-24 (×11): qty 0.4

## 2020-12-24 MED ORDER — NITROGLYCERIN IN D5W 200-5 MCG/ML-% IV SOLN
0.0000 ug/min | INTRAVENOUS | Status: DC
  Administered 2020-12-24: 5 ug/min via INTRAVENOUS
  Filled 2020-12-24: qty 250

## 2020-12-24 NOTE — ED Triage Notes (Signed)
Patient arrived with EMS from home reports worsening SOB this evening with chest congestion and occasional dry cough , placed on a BIPAP at arrival.

## 2020-12-24 NOTE — H&P (Signed)
Date: 12/24/2020               Patient Name:  Matthew [name]. MRN: [id number]  DOB: 12/15/1966 Age / Sex: 55 y.o., male   PCP: Charlott Rakes, MD         Medical Service: Internal Medicine Teaching Service         Attending Physician: Dr. Velna Ochs    First Contact: Dr. Alexandria Lodge Pager: [phone]  Second Contact: Dr. 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  The patient is vague with his report of his symptoms and timeline. The patient states he was in his usual state of health until yesterday when he began to "feel bad" and became short of breath. The shortness of breath improves with laying flat and worsens with exertion. Associated with chest pressure which he reports started yesterday evening and radiates down his right arm, however he attributes it to hunger. Endorses a left-sided headache with onset earlier today. States his vision is blurry bilaterally chronically. Denies nausea, vomiting, lower extremity swelling,  dysuria. Reports he ran out of most of his medications ~1 week ago and wasn't able to get a ride to pick up refills. Reports intermittent adherence to his medications since his wife died suddenly while he was hospitalized in 02/2020. Endorses low energy, poor sleep, guilt surrounding his wife's death since he wasn't with her when she died, and thoughts of hurting himself. With regard to his suicidal ideation, he states he has plans but has not  had attempts, has guns in every room of his house. When asked what has kept him from acting on his plans, he states he didn't want to ruin his family's holiday. Reports since his wife died, he has not seen his family as much.   Admitted 07/04/20-07/06/20 with acute hypoxic respiratory failure secondary to acute CHF and hypertensive urgency also in the setting of poor adherence of his medications.  ED Course: En route via EMS placed on CPAP for oxygen saturation 88%. On arrival to the ED, he was switched from CPAP to BiPAP however was taken off shortly thereafter due to discomfort. In the ED, hypertensive with BP 200/130 and placed on nitro gtt. Tachypneic with RR 20s-30s, P 60s-80s. CBC unremarkable. sCr 1.41 (baseline ~1.40-1.70) with eGFR of 60. Trop 16->19. BNP 647.2. COVID negative. EKG with LVH and occasional PVCs but no acute ischemic changes. CXR with bilateral hazy opacities with mild vascular congestion. Received aspirin 324 mg, Lasix 40 mg IV, and transitioned from nitro gtt to nitro 2% patch.   Meds:  Current Meds  Medication Sig  . acetaminophen (TYLENOL) 500 MG tablet Take 500 mg by mouth daily as needed for mild pain or moderate pain.   Marland Kitchen atorvastatin (LIPITOR) 80 MG tablet Take 1 tablet (80 mg total) by mouth daily.  . carvedilol (COREG) 12.5 MG tablet Take 1 tablet (12.5 mg total) by mouth 2 (two) times daily with a meal. Must have office visit for refills  .  ezetimibe (ZETIA) 10 MG tablet Take 1 tablet (10 mg total) by mouth daily.  Marland Kitchen FLUoxetine (PROZAC) 20 MG tablet Take 1 tablet (20 mg total) by mouth daily.  . furosemide (LASIX) 40 MG tablet TAKE 1 TABLET (40 MG TOTAL) BY MOUTH DAILY.  Marland Kitchen glipiZIDE (GLUCOTROL) 5 MG tablet TAKE 1 TABLET (5 MG TOTAL) BY MOUTH 2 (TWO) TIMES DAILY BEFORE A MEAL. MUST HAVE OFFICE VISIT FOR REFILLS (Patient taking differently: Take 5 mg by mouth 2 (two) times daily before a meal.)  . glucose blood (ONE TOUCH ULTRA TEST) test strip Use as instructed  .  hydrALAZINE (APRESOLINE) 50 MG tablet TAKE 1 TABLET (50 MG TOTAL) BY MOUTH 3 (THREE) TIMES DAILY.  Marland Kitchen lisinopril (ZESTRIL) 5 MG tablet Take 1 tablet (5 mg total) by mouth daily.  . timolol (TIMOPTIC) 0.5 % ophthalmic solution Place 1 drop into both eyes daily after breakfast.    Social History: Lives alone. Has a history of smoking ~1 pack of cigarettes per week but hasn't smoked in several months. Denies alcohol, cocaine, marijuana use.   Family History: Father died from an MI in his 13s. Mother died from ovarian cancer at an unknown age. No family history of diabetes.  Allergies: Allergies as of 12/24/2020  . (No Known Allergies)   Past Medical History:  Diagnosis Date  . Acute combined systolic and diastolic CHF, NYHA class 3 ([place])    a. 02/2014 Echo: EF 25-30%.  . Cardiomyopathy ([place])    a. 02/2014 Echo: EF 25-30%, sev glob HK with inferolat HK->AK, mod conc LVH, Gr 2 DD, Mild MR, sev dil LA.  . Depression   . DM (diabetes mellitus) ([place])   . High cholesterol   . Hypertension   . Morbid obesity ([place])   . Tobacco abuse     Review of Systems: A complete ROS was negative except as per HPI.   Physical Exam: Blood pressure (!) 172/122, pulse 66, temperature 98.3 F (36.8 C), temperature source Temporal, resp. rate 16, height '5\' 9"'  (1.753 m), weight 102 kg, SpO2 100 %. Constitutional: tired-appearing man sitting up in bed, in no acute distress, fidgety HENT: normocephalic atraumatic, mucous membranes moist Eyes: conjunctiva non-erythematous Neck: supple Cardiovascular: regular rate and rhythm, no m/r/g, no lower extremity edema Pulmonary/Chest: normal work of breathing on 2 L [place], lungs with bibasilar crackles, no wheezes or rhonchi Abdominal: soft, non-tender, non-distended MSK: normal bulk and tone Neurological: alert & oriented x 3; PERRL; EOMs intact bilaterally; slight L nasolabial flattening, symmetric smile, tongue midline; left side of body "feels funny," dysmetria with L  finger-to-nose, L drift, 5/5 strength in RUE, 4/5 strength in LUE, 3/5 strength in bilateral lower extremities though possibly effort dependent Skin: warm and dry Psych: depressed mood, labile affect, endorses SI with possible plans, no attempts, no HI  Labs: CBC    Component Value Date/Time   WBC 8.5 12/24/2020 0358   RBC 5.26 12/24/2020 0358   HGB 15.3 12/24/2020 0614   HCT 45.0 12/24/2020 0614   PLT 210 12/24/2020 0358   MCV 88.6 12/24/2020 0358   MCH 30.8 12/24/2020 0358   MCHC 34.8 12/24/2020 0358   RDW 12.9 12/24/2020 0358   LYMPHSABS 1.7 12/24/2020 0358   MONOABS 0.7 12/24/2020 0358   EOSABS 0.1 12/24/2020 0358   BASOSABS 0.0 12/24/2020 0358     CMP     Component Value Date/Time   NA 142 12/24/2020 0614   NA 146 (H) 02/23/2020 1422   K 3.1 (L)  12/24/2020 0614   CL 107 12/24/2020 0358   CO2 23 12/24/2020 0358   GLUCOSE 126 (H) 12/24/2020 0358   BUN 8 12/24/2020 0358   BUN 13 02/23/2020 1422   CREATININE 1.41 (H) 12/24/2020 0358   CREATININE 1.26 01/01/2017 0838   CALCIUM 9.0 12/24/2020 0358   PROT 6.6 12/24/2020 0358   PROT 6.7 03/07/2018 0834   ALBUMIN 3.6 12/24/2020 0358   ALBUMIN 4.2 03/07/2018 0834   AST 22 12/24/2020 0358   ALT 37 12/24/2020 0358   ALKPHOS 64 12/24/2020 0358   BILITOT 1.0 12/24/2020 0358   BILITOT 0.5 03/07/2018 0834   GFRNONAA 60 (L) 12/24/2020 0358   GFRNONAA 67 01/01/2017 0838   GFRAA >60 07/05/2020 0656   GFRAA 77 01/01/2017 0838    Imaging: DG Chest Port 1 View  Result Date: 12/24/2020 CLINICAL DATA:  Shortness of breath, chest congestion and dry cough EXAM: PORTABLE CHEST 1 VIEW COMPARISON:  Radiograph 10/03/2020, CT 02/04/2017 FINDINGS: Patchy ill-defined opacities are present throughout both lungs. Some mild vascular congestion is noted as well with cephalization indistinct vascularity and fissural thickening. Prominent cardiac silhouette though possibly accentuated by low volumes and portable technique. No pneumothorax. No  effusion. No acute osseous or soft tissue abnormality. Telemetry leads overlie the chest. IMPRESSION: 1. Ill-defined and patchy opacities throughout both lungs with some mild vascular congestion could reflect edema and/or infection in the appropriate clinical context. Electronically Signed   By: Lovena Le M.D.   On: 12/24/2020 04:25    EKG: personally reviewed my interpretation is NSR with left ventricular hypertrophy  Assessment & Plan by Problem: Active Problems:   Hypertensive urgency   Acute exacerbation of congestive heart failure ([place])   Matthew [name]. 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  Acute on chronic heart failure with mildly reduced ejection fraction  Patient presents with acute dyspnea in the setting of not taking his medications, including antihypertensives and Lasix, for the last week. Admission 07/04/20-07/06/20 for similar. Last echo from 02/2020 with EF 40-45%. Hypertensive as below. Initially hypoxic requiring BiPAP, on 2 L [place] on our evaluation. Bibasilar crackles, no lower extremity edema. BNP 647.2. EKG with LVH but no T wave changes. [place] flat. CXR consistent with pulmonary edema. Symptomatic improvement after 40 mg IV Lasix in the ED. Suspect precipitated by medication non-adherence leading to uncontrolled hypertension. Will monitor response to diuresis and repeat echocardiogram. - s/p 40 mg IV Lasix - Echocardiogram - Wean oxygen as tolerated  - Monitor and replete Mg > 2 and K > 4 - Strict I&O, daily weights  Hypertensive emergency In the ED, hypertensive with BP 200/130, symptomatic with flash pulmonary edema, chest pressure, headache. As above, presentation in  the setting of non-adherence to home antihypertensives hydralazine 50 mg TID, lisinopril 5 mg daily, carvedilol 12.5 mg BID. Placed on nitro gtt with good effect. Subsequently transitioned to nitro 2% patch. Given patient's history of CVA and left-sided deficits on exam, will obtain CT head to rule out potential bleed or recrudescence. - BP improved on nitro gtt, now on nitroglycerin 2% patch 1 in (15 mg) - holding home antihypertensives to avoid hypoperfusion, resume as tolerated - CT head  Depression  Suicidal ideation Patient reports ongoing depressed  mood since his wife passed away suddenly ~10 months ago. Meets criteria for depression with symptoms of poor sleep, anhedonia, guilt, low energy, suicidal ideation. Given his active suicidal ideation and access to firearms in the home, will consult psychiatry for further evaluation. - Continue home fluoxetine 20 mg daily - Suicide precautions: 1:1 sitter - Psych consult  Type II diabetes mellitus Prescribed glipizide 5 mg twice daily with meals. Reports non-adherence. Random BG 126 on arrival. Will hold home glipizide while in the hospital. - CBG monitoring - SSI  Hyperlipidemia - Continue home atorvastatin 80 mg daily - Continue home ezetimibe (Zetia) 10 mg daily  CKD Stage 2 sCr 1.41 (baseline ~1.40-1.70) with eGFR of 60. Will follow daily BMP while diuresing. - AM BMP   Diet: Heart Healthy VTE: Enoxaparin IVF: None Code: DNR  Prior to Admission Living Arrangement: Home Anticipated Discharge Location: Home Barriers to Discharge: further evaluation and management of HTN, heart failure, pysch evaluation  Dispo: Admit patient to Inpatient with expected length of stay greater than 2 midnights.  Signed: Alexandria Lodge, MD PGY-1 Internal Medicine Teaching Service Pager: [phone] 12/24/2020

## 2020-12-24 NOTE — ED Notes (Signed)
PT tolerating 2L [place].  Sats of 100%

## 2020-12-24 NOTE — Progress Notes (Signed)
Pt. Arrived from ED. VSS with pt. GCS-15. Sitter at bedside as well as CCMD aware of pt. Status. See PCR for vitals. Orders given and carried out. No c/o sob per pt. 100%RA

## 2020-12-24 NOTE — ED Notes (Signed)
Patient transported to X-ray 

## 2020-12-24 NOTE — ED Notes (Signed)
Pt 98% on RA.  Have requested pt bed be changed to tele.

## 2020-12-24 NOTE — ED Provider Notes (Signed)
[facility] EMERGENCY DEPARTMENT Provider Note   CSN: [id number] Arrival date & time: 12/24/20  0347   History Chief Complaint  Patient presents with  . Shortness of Breath    BIPAP    Matthew Monclova. is a 55 y.o. male.  The history is provided by the patient.  Shortness of Breath He has history of hypertension, diabetes, hyperlipidemia, combined systolic and diastolic heart failure and is brought in by ambulance because of difficulty breathing.  He states that he ran out of all of his medications about 1 week ago and started getting short of breath yesterday which got worse today.  Curiously, dyspnea is improved if he lays flat.  There has been some slight pressure feeling in his chest.  He denies nausea or vomiting or diaphoresis.  EMS noted oxygen saturation of 88% on room air and placed him on CPAP and transported him to the ED.  Past Medical History:  Diagnosis Date  . Acute combined systolic and diastolic CHF, NYHA class 3 ([place])    a. 02/2014 Echo: EF 25-30%.  . Cardiomyopathy ([place])    a. 02/2014 Echo: EF 25-30%, sev glob HK with inferolat HK->AK, mod conc LVH, Gr 2 DD, Mild MR, sev dil LA.  . Depression   . DM (diabetes mellitus) ([place])   . High cholesterol   . Hypertension   . Morbid obesity ([place])   . Tobacco abuse     Patient Active Problem List   Diagnosis Date Noted  . Acute respiratory failure with hypoxia ([place]) 02/15/2020  . Acute on chronic combined systolic and diastolic CHF (congestive heart failure) ([place]) 02/15/2020  . Hypertensive urgency 02/15/2020  . CHF (congestive heart failure) ([place]) 02/15/2020  . 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 Expressive aphasia 02/20/2017  . Mixed hyperlipidemia   . History of stroke 01/31/2017  . Erectile dysfunction 07/30/2016  . OSA (obstructive sleep apnea) 05/10/2015  . CKD (chronic kidney disease) stage 2, GFR 60-89 ml/min 03/10/2014  . Diabetes mellitus type II, controlled ([place])  03/10/2014  . Chronic combined systolic and diastolic congestive heart failure ([place]) 03/03/2014  . Smoker   . Morbid obesity ([place])   . Hypertension 03/02/2014    Past Surgical History:  Procedure Laterality Date  . CATARACT EXTRACTION Right   . LEFT HEART CATHETERIZATION WITH CORONARY ANGIOGRAM N/A 03/04/2014   Procedure: LEFT HEART CATHETERIZATION WITH CORONARY ANGIOGRAM;  Surgeon: Burnell Blanks, MD;  Location: [facility] CATH LAB;  Service: Cardiovascular;  Laterality: N/A;  . TEE WITHOUT CARDIOVERSION N/A 02/04/2017   Procedure: TRANSESOPHAGEAL ECHOCARDIOGRAM (TEE);  Surgeon: Dorothy Spark, MD;  Location: [facility] ENDOSCOPY;  Service: Cardiovascular;  Laterality: N/A;       Family History  Problem Relation Age of Onset  . Heart attack Father        died @ 55  . Cervical cancer Mother        died @ 58    Social History   Tobacco Use  . Smoking status: Current Some Day Smoker    Years: 20.00    Types: Cigarettes  . Smokeless tobacco: Never Used  Vaping Use  . Vaping Use: Never used  Substance Use Topics  . Alcohol use: No  . Drug use: No    Home Medications Prior to Admission medications   Medication Sig Start Date End Date Taking? Authorizing Provider  acetaminophen (TYLENOL) 500 MG tablet Take 500 mg by mouth daily as needed for mild pain or moderate  pain.     [provider]  aspirin 81 MG tablet Take 1 tablet (81 mg total) by mouth daily. Patient not taking: Reported on 11/18/2018 07/11/18   Charlott Rakes, MD  atorvastatin (LIPITOR) 80 MG tablet Take 1 tablet (80 mg total) by mouth daily. 12/02/19   Charlott Rakes, MD  carvedilol (COREG) 12.5 MG tablet Take 1 tablet (12.5 mg total) by mouth 2 (two) times daily with a meal. Must have office visit for refills 12/02/19   Charlott Rakes, MD  ezetimibe (ZETIA) 10 MG tablet Take 1 tablet (10 mg total) by mouth daily. Patient not taking: Reported on 07/04/2020 12/02/19   Charlott Rakes, MD  FLUoxetine (PROZAC)  20 MG tablet Take 1 tablet (20 mg total) by mouth daily. Patient not taking: Reported on 07/04/2020 12/02/19   Charlott Rakes, MD  furosemide (LASIX) 40 MG tablet TAKE 1 TABLET (40 MG TOTAL) BY MOUTH DAILY. 12/13/20   Newlin, Charlane Ferretti, MD  glipiZIDE (GLUCOTROL) 5 MG tablet TAKE 1 TABLET (5 MG TOTAL) BY MOUTH 2 (TWO) TIMES DAILY BEFORE A MEAL. MUST HAVE OFFICE VISIT FOR REFILLS 12/13/20   Charlott Rakes, MD  glucose blood (ONE TOUCH ULTRA TEST) test strip Use as instructed Patient not taking: Reported on 02/19/2019 12/31/16   Charlott Rakes, MD  hydrALAZINE (APRESOLINE) 50 MG tablet TAKE 1 TABLET (50 MG TOTAL) BY MOUTH 3 (THREE) TIMES DAILY. 12/13/20   Charlott Rakes, MD  lisinopril (ZESTRIL) 5 MG tablet Take 1 tablet (5 mg total) by mouth daily. 12/02/19   Charlott Rakes, MD  timolol (TIMOPTIC) 0.5 % ophthalmic solution Place 1 drop into both eyes daily after breakfast. 06/21/20   [provider]  lovastatin (MEVACOR) 20 MG tablet Take 2 tablets (40 mg total) by mouth at bedtime. 06/24/14 08/26/14  Lorayne Marek, MD    Allergies    Patient has no known allergies.  Review of Systems   Review of Systems  Respiratory: Positive for shortness of breath.   All other systems reviewed and are negative.   Physical Exam Updated Vital Signs BP (!) 188/126 (BP Location: Left Arm)   Pulse 89   Temp 98.3 F (36.8 C) (Temporal)   Resp (!) 35   Ht 5\' 9"  (1.753 m)   Wt 102 kg   SpO2 99%   BMI 33.21 kg/m   Physical Exam Vitals and nursing note reviewed.   55 year old male, in moderate respiratory distress. Vital signs are significant for markedly elevated blood pressure and respiratory rate. Oxygen saturation is 99%, which is normal.  He is somewhat anxious. Head is normocephalic and atraumatic. PERRLA, EOMI. Oropharynx is clear. Neck is nontender and supple without adenopathy or JVD. Back is nontender and there is no CVA tenderness. Lungs have bibasilar rales going halfway up.  There are  no wheezes or rhonchi. Chest is nontender.  There is slight use of accessory muscles of respiration. Heart has regular rate and rhythm without murmur. Abdomen is soft, flat, nontender without masses or hepatosplenomegaly and peristalsis is normoactive. Extremities have no cyanosis or edema, full range of motion is present. Skin is warm and dry without rash. Neurologic: Mental status is normal, cranial nerves are intact, there are no motor or sensory deficits.  ED Results / Procedures / Treatments   Labs (all labs ordered are listed, but only abnormal results are displayed) Labs Reviewed  COMPREHENSIVE METABOLIC PANEL - Abnormal; Notable for the following components:      Result Value   Glucose, Bld 126 (*)  Creatinine, Ser 1.41 (*)    GFR, Estimated 60 (*)    All other components within normal limits  BRAIN NATRIURETIC PEPTIDE - Abnormal; Notable for the following components:   B Natriuretic Peptide 647.2 (*)    All other components within normal limits  CBG MONITORING, ED - Abnormal; Notable for the following components:   Glucose-Capillary 119 (*)    All other components within normal limits  I-STAT ARTERIAL BLOOD GAS, ED - Abnormal; Notable for the following components:   Potassium 3.1 (*)    All other components within normal limits  I-STAT VENOUS BLOOD GAS, ED - Abnormal; Notable for the following components:   pCO2, Ven 42.0 (*)    pO2, Ven 168.0 (*)    Calcium, Ion 1.13 (*)    All other components within normal limits  TROPONIN I (HIGH SENSITIVITY) - Abnormal; Notable for the following components:   Troponin I (High Sensitivity) 19 (*)    All other components within normal limits  SARS CORONAVIRUS 2 (TAT 6-24 HRS)  CBC WITH DIFFERENTIAL/PLATELET  TROPONIN I (HIGH SENSITIVITY)    EKG EKG Interpretation  Date/Time:  Saturday December 24 2020 03:54:05 EST Ventricular Rate:  86 PR Interval:    QRS Duration: 108 QT Interval:  374 QTC Calculation: 448 R  Axis:   76 Text Interpretation: Sinus rhythm Ventricular premature complex Probable left atrial enlargement LVH with secondary repolarization abnormality When compared with ECG of 10/03/2020, No significant change was found Confirmed by Delora Fuel ([id number]) on 12/24/2020 3:56:47 AM   EKG Interpretation  Date/Time:  Saturday December 24 2020 03:56:38 EST Ventricular Rate:  88 PR Interval:    QRS Duration: 110 QT Interval:  384 QTC Calculation: 465 R Axis:   83 Text Interpretation: Sinus rhythm Ventricular premature complex Probable left atrial enlargement LVH with IVCD and secondary repol abnrm When compared with ECG of EARLIER SAME DATE Premature ventricular complexes are now present Confirmed by Delora Fuel ([id number]) on 12/24/2020 5:20:42 AM        Radiology DG Chest Port 1 View  Result Date: 12/24/2020 CLINICAL DATA:  Shortness of breath, chest congestion and dry cough EXAM: PORTABLE CHEST 1 VIEW COMPARISON:  Radiograph 10/03/2020, CT 02/04/2017 FINDINGS: Patchy ill-defined opacities are present throughout both lungs. Some mild vascular congestion is noted as well with cephalization indistinct vascularity and fissural thickening. Prominent cardiac silhouette though possibly accentuated by low volumes and portable technique. No pneumothorax. No effusion. No acute osseous or soft tissue abnormality. Telemetry leads overlie the chest. IMPRESSION: 1. Ill-defined and patchy opacities throughout both lungs with some mild vascular congestion could reflect edema and/or infection in the appropriate clinical context. Electronically Signed   By: Lovena Le M.D.   On: 12/24/2020 04:25    Procedures Procedures  CRITICAL CARE Performed by: Delora Fuel Total critical care time: 150 minutes Critical care time was exclusive of separately billable procedures and treating other patients. Critical care was necessary to treat or prevent imminent or life-threatening deterioration. Critical care was time spent  personally by me on the following activities: development of treatment plan with patient and/or surrogate as well as nursing, discussions with consultants, evaluation of patient's response to treatment, examination of patient, obtaining history from patient or surrogate, ordering and performing treatments and interventions, ordering and review of laboratory studies, ordering and review of radiographic studies, pulse oximetry and re-evaluation of patient's condition.  Medications Ordered in ED Medications  aspirin chewable tablet 324 mg (324 mg Oral Given 12/24/20 0408)  furosemide (  LASIX) injection 40 mg (40 mg Intravenous Given 12/24/20 0406)  nitroGLYCERIN (NITROGLYN) 2 % ointment 1 inch (1 inch Topical Given 12/24/20 0601)    ED Course  I have reviewed the triage vital signs and the nursing notes.  Pertinent labs & imaging results that were available during my care of the patient were reviewed by me and considered in my medical decision making (see chart for details).  MDM Rules/Calculators/A&P Acute dyspnea which is likely heart failure exacerbation.  On arrival, he is switched from CPAP to BiPAP.  Patient arrived complaining that he could not breathe and that his BiPAP mask was keeping him from breathing.  At that point, oxygen saturation was 100% and he was taken off of BiPAP and maintained adequate oxygen saturation.  Old records were reviewed, and he does have prior hospitalizations with similar presentations.  ECG shows no acute changes.  He is given aspirin and started on intravenous nitroglycerin and is given intravenous furosemide.  Breathing improved dramatically after diuresis.  Blood pressure has come down and he was switched from intravenous nitrates to topical.  Blood pressure has started to rebound with that.  Labs show stable renal insufficiency.  ABG does not show any significant CO2 retention.  Chest x-ray is consistent with heart failure exacerbation.  ECGs show left ventricular  hypertrophy and occasional PVC but no acute ischemic changes.  Case is discussed with Dr. Gilford Rile of internal medicine teaching service who agrees to admit the patient.  Final Clinical Impression(s) / ED Diagnoses Final diagnoses:  Acute on chronic combined systolic and diastolic heart failure ([place])  Hypertensive urgency  Renal insufficiency    Rx / DC Orders ED Discharge Orders    None       Delora Fuel, MD 16/10/96 [id number]

## 2020-12-24 NOTE — ED Notes (Signed)
Admitting at bedside 

## 2020-12-24 NOTE — Progress Notes (Signed)
  Echocardiogram 2D Echocardiogram has been performed.  Matthew [name] 12/24/2020, 4:01 PM

## 2020-12-25 DIAGNOSIS — R4589 Other symptoms and signs involving emotional state: Secondary | ICD-10-CM

## 2020-12-25 DIAGNOSIS — I5023 Acute on chronic systolic (congestive) heart failure: Secondary | ICD-10-CM

## 2020-12-25 DIAGNOSIS — I10 Essential (primary) hypertension: Secondary | ICD-10-CM

## 2020-12-25 LAB — BASIC METABOLIC PANEL
Anion gap: 10 (ref 5–15)
BUN: 9 mg/dL (ref 6–20)
CO2: 23 mmol/L (ref 22–32)
Calcium: 9.2 mg/dL (ref 8.9–10.3)
Chloride: 105 mmol/L (ref 98–111)
Creatinine, Ser: 1.34 mg/dL — ABNORMAL HIGH (ref 0.61–1.24)
GFR, Estimated: >60 mL/min (ref >60)
Glucose, Bld: 115 mg/dL — ABNORMAL HIGH (ref 70–99)
Potassium: 3.8 mmol/L (ref 3.5–5.1)
Sodium: 138 mmol/L (ref 135–145)

## 2020-12-25 LAB — RAPID URINE DRUG SCREEN, HOSP PERFORMED
Amphetamines: NOT DETECTED
Barbiturates: NOT DETECTED
Benzodiazepines: NOT DETECTED
Cocaine: POSITIVE — AB
Opiates: NOT DETECTED
Tetrahydrocannabinol: NOT DETECTED

## 2020-12-25 LAB — GLUCOSE, CAPILLARY: Glucose-Capillary: 96 mg/dL (ref 70–99)

## 2020-12-25 LAB — MAGNESIUM: Magnesium: 1.8 mg/dL (ref 1.7–2.4)

## 2020-12-25 LAB — TSH: TSH: 2.812 u[IU]/mL (ref 0.350–4.500)

## 2020-12-25 MED ORDER — FLUOXETINE HCL 20 MG PO CAPS
30.0000 mg | ORAL_CAPSULE | Freq: Every day | ORAL | Status: DC
  Administered 2020-12-26 – 2021-01-03 (×9): 30 mg via ORAL
  Filled 2020-12-25 (×9): qty 1

## 2020-12-25 MED ORDER — CLONAZEPAM 0.25 MG PO TBDP
0.2500 mg | ORAL_TABLET | Freq: Every evening | ORAL | Status: DC | PRN
Start: 2020-12-25 — End: 2020-12-25

## 2020-12-25 MED ORDER — CARVEDILOL 25 MG PO TABS
25.0000 mg | ORAL_TABLET | Freq: Two times a day (BID) | ORAL | Status: DC
  Administered 2020-12-25 – 2021-01-03 (×19): 25 mg via ORAL
  Filled 2020-12-25 (×19): qty 1

## 2020-12-25 MED ORDER — CLONAZEPAM 0.5 MG PO TABS
0.5000 mg | ORAL_TABLET | Freq: Every evening | ORAL | Status: DC | PRN
  Administered 2020-12-25 – 2021-01-02 (×9): 0.5 mg via ORAL
  Filled 2020-12-25 (×9): qty 1

## 2020-12-25 MED ORDER — FUROSEMIDE 10 MG/ML IJ SOLN
40.0000 mg | Freq: Once | INTRAMUSCULAR | Status: AC
  Administered 2020-12-25: 40 mg via INTRAVENOUS
  Filled 2020-12-25: qty 4

## 2020-12-25 MED ORDER — LISINOPRIL 5 MG PO TABS
5.0000 mg | ORAL_TABLET | Freq: Every day | ORAL | Status: DC
  Administered 2020-12-25 – 2020-12-26 (×2): 5 mg via ORAL
  Filled 2020-12-25 (×2): qty 1

## 2020-12-25 MED ORDER — HYDRALAZINE HCL 50 MG PO TABS
50.0000 mg | ORAL_TABLET | Freq: Three times a day (TID) | ORAL | Status: DC
  Administered 2020-12-25 – 2020-12-26 (×3): 50 mg via ORAL
  Filled 2020-12-25 (×3): qty 1

## 2020-12-25 NOTE — Consult Note (Signed)
[facility] Consult   Reason for Consult:  Suicidal ideations with a plan Referring Physician:  Dr. Philipp Ovens Patient Identification: Matthew [name]. MRN:  [id number] Principal Diagnosis: <principal problem not specified> Diagnosis:  Active Problems:   Hypertensive urgency   Acute exacerbation of congestive heart failure ([place])   Total Time spent with patient: 30 minutes  Subjective:   Matthew Philip. is a 55 y.o. male patient admitted with shortness of breath. Psych consult placed for suicidal ideations with a plan. Patient is observed lying in bed, resting in fetal position. He is awaken by gentle calling of his name. Patient was alert and oriented, grudgingly cooperative, and permission was granted to proceed with evaluation. Patient reports depressive symptoms for about 1 year since the death of his wife in 02-25-20. Per chart review she passed away while he was in the hospital that resulted in him leaving AMA. Since then he has felt saddened by not being there for her, when she passed away. Patient minimizes depressive symptoms and remains guarded when discussing his current symptoms associated with his suicidal ideations. He does acknowledge that his depression has impacted his health, and his ability to remain adherent with his medications. He does not admit to me that he has a suicidal plan or suicidal thoughts, instead he chose to remain evasive and not forth coming with information. He admits ot having access to weapons. He is unable to identify any protective factor beside "my family I guess." He denies any current suicidal ideations at this time. He denies any previous suicide attempts.   On evaluation he was alert and oriented, grudgingly cooperative. He does appear to be very restless, as he is observed shuffling aorund and tossing in bed. He endorses depressive symptoms but does not which to talk about them at this time. He has mentioned to nursing staff that he  is anxious, and cant fall asleep due to his anxiety. He does admit that his depression started about one year ago after the lost of his wife. He reports since that time he has had difficult time maintaining his health and remaining adherent to his medications. He has limited insight and poor judgment on the impacts his depression is taking on his physical condition. Patient with worsening depression and now endorsing suicidal thoughts with a plan, with intentions and access to weapons. He has a high risk to complete suicide, also must consider the upcoming anniversary of his wife's death.   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  The patient is vague with his report of his symptoms and timeline. The patient states he was in his usual state of health until yesterday when he began to "feel bad" and became short of breath. The shortness of breath improves with laying flat and worsens with exertion. Associated with chest pressure which he reports started yesterday evening and radiates down his right arm, however he attributes it to hunger. Endorses a left-sided headache with onset earlier today. States his vision is blurry bilaterally chronically. Denies nausea, vomiting, lower extremity swelling,  dysuria. Reports he ran out of most of his medications ~1 week ago and wasn't able to get a ride to pick up refills. Reports  intermittent adherence to his medications since his wife died suddenly while he was hospitalized in 02/2020. Endorses low energy, poor sleep, guilt surrounding his wife's death since he wasn't with her when she died, and thoughts of hurting himself. With regard to his  suicidal ideation, he states he has plans but has not had attempts, has guns in every room of his house. When asked what has kept him from acting on his plans, he states he didn't want to ruin his family's holiday. Reports since his wife died, he has not seen his family as much.   Admitted 07/04/20-07/06/20 with acute hypoxic respiratory failure secondary to acute CHF and hypertensive urgency also in the setting of poor adherence of his medications.  Past Psychiatric History: Denies  Risk to Self:  Yes Risk to Others:  NO Prior Inpatient Therapy:  Denies Prior Outpatient Therapy:  Denies  Past Medical History:  Past Medical History:  Diagnosis Date  . Acute combined systolic and diastolic CHF, NYHA class 3 ([place])    a. 02/2014 Echo: EF 25-30%.  . Cardiomyopathy ([place])    a. 02/2014 Echo: EF 25-30%, sev glob HK with inferolat HK->AK, mod conc LVH, Gr 2 DD, Mild MR, sev dil LA.  . Depression   . DM (diabetes mellitus) ([place])   . High cholesterol   . Hypertension   . Morbid obesity ([place])   . Tobacco abuse     Past Surgical History:  Procedure Laterality Date  . CATARACT EXTRACTION Right   . LEFT HEART CATHETERIZATION WITH CORONARY ANGIOGRAM N/A 03/04/2014   Procedure: LEFT HEART CATHETERIZATION WITH CORONARY ANGIOGRAM;  Surgeon: Burnell Blanks, MD;  Location: [facility] CATH LAB;  Service: Cardiovascular;  Laterality: N/A;  . TEE WITHOUT CARDIOVERSION N/A 02/04/2017   Procedure: TRANSESOPHAGEAL ECHOCARDIOGRAM (TEE);  Surgeon: Dorothy Spark, MD;  Location: [facility] ENDOSCOPY;  Service: Cardiovascular;  Laterality: N/A;   Family History:  Family History  Problem Relation Age of Onset  . Heart attack Father        died @ 8  . Cervical cancer Mother        died @ 50   Family Psychiatric  History: Denies Social History:  Social History   Substance and Sexual Activity  Alcohol Use No     Social History   Substance and Sexual Activity  Drug Use No    Social History   Socioeconomic  History  . Marital status: Widowed    Spouse name: Not on file  . Number of children: 2  . Years of education: 73  . Highest education level: Not on file  Occupational History  . Occupation: [employer]  Tobacco Use  . Smoking status: Current Some Day Smoker    Years: 20.00    Types: Cigarettes  . Smokeless tobacco: Never Used  Vaping Use  . Vaping Use: Never used  Substance and Sexual Activity  . Alcohol use: No  . Drug use: No  . Sexual activity: Yes  Other Topics Concern  . Not on file  Social History Narrative   Lives with 2 young children in [place].  Wife died in 05/09/20.  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   Left-handed   Caffeine: occasional tea   Social Determinants of Health   Financial Resource Strain: Not on file  Food Insecurity: Not on file  Transportation Needs: Not on file  Physical Activity: Not on file  Stress: Not on file  Social Connections: Not  on file   Additional Social History:    Allergies:  No Known Allergies  Labs:  Results for orders placed or performed during the hospital encounter of 12/24/20 (from the past 48 hour(s))  SARS CORONAVIRUS 2 (TAT 6-24 HRS) Nasopharyngeal Nasopharyngeal Swab     Status: None   Collection Time: 12/24/20  3:50 AM   Specimen: Nasopharyngeal Swab  Result Value Ref Range   SARS Coronavirus 2 NEGATIVE NEGATIVE    Comment: (NOTE) SARS-CoV-2 target nucleic acids are NOT DETECTED.  The SARS-CoV-2 RNA is generally detectable in upper and lower respiratory specimens during the acute phase of infection. Negative results do not preclude SARS-CoV-2 infection, do not rule out co-infections with other pathogens, and should not be used as the sole basis for treatment or other patient management decisions. Negative results must be combined with clinical observations, patient history, and epidemiological information. The expected result is Negative.  Fact Sheet for  Patients: SugarRoll.be  Fact Sheet for Healthcare Providers: https://www.woods-mathews.com/  This test is not yet approved or cleared by the [place] FDA and  has been authorized for detection and/or diagnosis of SARS-CoV-2 by FDA under an Emergency Use Authorization (EUA). This EUA will remain  in effect (meaning this test can be used) for the duration of the COVID-19 declaration under Se ction 564(b)(1) of the Act, 21 U.S.C. section 360bbb-3(b)(1), unless the authorization is terminated or revoked sooner.  Performed at [facility], [place] [address], [place], [place] [address]   Comprehensive metabolic panel     Status: Abnormal   Collection Time: 12/24/20  3:58 AM  Result Value Ref Range   Sodium 141 135 - 145 mmol/L   Potassium 3.6 3.5 - 5.1 mmol/L   Chloride 107 98 - 111 mmol/L   CO2 23 22 - 32 mmol/L   Glucose, Bld 126 (H) 70 - 99 mg/dL    Comment: Glucose reference range applies only to samples taken after fasting for at least 8 hours.   BUN 8 6 - 20 mg/dL   Creatinine, Ser 1.41 (H) 0.61 - 1.24 mg/dL   Calcium 9.0 8.9 - 10.3 mg/dL   Total Protein 6.6 6.5 - 8.1 g/dL   Albumin 3.6 3.5 - 5.0 g/dL   AST 22 15 - 41 U/L   ALT 37 0 - 44 U/L   Alkaline Phosphatase 64 38 - 126 U/L   Total Bilirubin 1.0 0.3 - 1.2 mg/dL   GFR, Estimated 60 (L) >60 mL/min    Comment: (NOTE) Calculated using the CKD-EPI Creatinine Equation (2021)    Anion gap 11 5 - 15    Comment: Performed at [facility] [address], [place], [place] [address]  Brain natriuretic peptide     Status: Abnormal   Collection Time: 12/24/20  3:58 AM  Result Value Ref Range   B Natriuretic Peptide 647.2 (H) 0.0 - 100.0 pg/mL    Comment: Performed at [facility] [address], [place], [place] [address]  Troponin I (High Sensitivity)     Status: None   Collection Time: 12/24/20  3:58 AM  Result Value Ref Range   Troponin I (High Sensitivity) 16  <18 ng/L    Comment: (NOTE) Elevated high sensitivity troponin I (hsTnI) values and significant  changes across serial measurements may suggest ACS but many other  chronic and acute conditions are known to elevate hsTnI results.  Refer to the "Links" section for chest pain algorithms and additional  guidance. Performed  at [facility], [address] [place], [place] [address]   CBC with Differential     Status: None   Collection Time: 12/24/20  3:58 AM  Result Value Ref Range   WBC 8.5 4.0 - 10.5 K/uL   RBC 5.26 4.22 - 5.81 MIL/uL   Hemoglobin 16.2 13.0 - 17.0 g/dL   HCT 62.7 03.5 - 00.9 %   MCV 88.6 80.0 - 100.0 fL   MCH 30.8 26.0 - 34.0 pg   MCHC 34.8 30.0 - 36.0 g/dL   RDW 38.1 82.9 - 93.7 %   Platelets 210 150 - 400 K/uL   nRBC 0.0 0.0 - 0.2 %   Neutrophils Relative % 71 %   Neutro Abs 6.0 1.7 - 7.7 K/uL   Lymphocytes Relative 20 %   Lymphs Abs 1.7 0.7 - 4.0 K/uL   Monocytes Relative 8 %   Monocytes Absolute 0.7 0.1 - 1.0 K/uL   Eosinophils Relative 1 %   Eosinophils Absolute 0.1 0.0 - 0.5 K/uL   Basophils Relative 0 %   Basophils Absolute 0.0 0.0 - 0.1 K/uL   Immature Granulocytes 0 %   Abs Immature Granulocytes 0.02 0.00 - 0.07 K/uL    Comment: Performed at [facility], [address] [place], [place] [address]  CBG monitoring, ED     Status: Abnormal   Collection Time: 12/24/20  4:14 AM  Result Value Ref Range   Glucose-Capillary 119 (H) 70 - 99 mg/dL    Comment: Glucose reference range applies only to samples taken after fasting for at least 8 hours.   Comment 1 Notify RN    Comment 2 Document in Chart   I-Stat venous blood gas, ED     Status: Abnormal   Collection Time: 12/24/20  4:14 AM  Result Value Ref Range   pH, Ven 7.396 7.250 - 7.430   pCO2, Ven 42.0 (L) 44.0 - 60.0 mmHg   pO2, Ven 168.0 (H) 32.0 - 45.0 mmHg   Bicarbonate 25.8 20.0 - 28.0 mmol/L   TCO2 27 22 - 32 mmol/L   O2 Saturation 100.0 %   Acid-Base Excess 1.0 0.0 - 2.0 mmol/L    Sodium 142 135 - 145 mmol/L   Potassium 3.5 3.5 - 5.1 mmol/L   Calcium, Ion 1.13 (L) 1.15 - 1.40 mmol/L   HCT 48.0 39.0 - 52.0 %   Hemoglobin 16.3 13.0 - 17.0 g/dL   Sample type VENOUS   Troponin I (High Sensitivity)     Status: Abnormal   Collection Time: 12/24/20  6:08 AM  Result Value Ref Range   Troponin I (High Sensitivity) 19 (H) <18 ng/L    Comment: (NOTE) Elevated high sensitivity troponin I (hsTnI) values and significant  changes across serial measurements may suggest ACS but many other  chronic and acute conditions are known to elevate hsTnI results.  Refer to the "Links" section for chest pain algorithms and additional  guidance. Performed at [facility], [address] [place], [place] [address]   I-Stat arterial blood gas, ED     Status: Abnormal   Collection Time: 12/24/20  6:14 AM  Result Value Ref Range   pH, Arterial 7.427 7.350 - 7.450   pCO2 arterial 40.7 32.0 - 48.0 mmHg   pO2, Arterial 90 83.0 - 108.0 mmHg   Bicarbonate 26.9 20.0 - 28.0 mmol/L   TCO2 28 22 - 32 mmol/L   O2 Saturation 97.0 %   Acid-Base Excess 2.0  0.0 - 2.0 mmol/L   Sodium 142 135 - 145 mmol/L   Potassium 3.1 (L) 3.5 - 5.1 mmol/L   Calcium, Ion 1.24 1.15 - 1.40 mmol/L   HCT 45.0 39.0 - 52.0 %   Hemoglobin 15.3 13.0 - 17.0 g/dL   Patient temperature 98.3 F    Collection site Radial    Drawn by RT    Sample type ARTERIAL   Basic metabolic panel     Status: Abnormal   Collection Time: 12/25/20  6:37 AM  Result Value Ref Range   Sodium 138 135 - 145 mmol/L   Potassium 3.8 3.5 - 5.1 mmol/L   Chloride 105 98 - 111 mmol/L   CO2 23 22 - 32 mmol/L   Glucose, Bld 115 (H) 70 - 99 mg/dL    Comment: Glucose reference range applies only to samples taken after fasting for at least 8 hours.   BUN 9 6 - 20 mg/dL   Creatinine, Ser 1.34 (H) 0.61 - 1.24 mg/dL   Calcium 9.2 8.9 - 10.3 mg/dL   GFR, Estimated >60 >60 mL/min    Comment: (NOTE) Calculated using the CKD-EPI Creatinine Equation  (2021)    Anion gap 10 5 - 15    Comment: Performed at [facility] [address] [place], [place] [address]  Magnesium     Status: None   Collection Time: 12/25/20  6:37 AM  Result Value Ref Range   Magnesium 1.8 1.7 - 2.4 mg/dL    Comment: Performed at [facility] [address], [place], [place] [address]    Current Facility-Administered Medications  Medication Dose Route Frequency Provider Last Rate Last Admin  . acetaminophen (TYLENOL) tablet 650 mg  650 mg Oral Q6H PRN Maudie Mercury, MD   650 mg at 12/25/20 [id number]   Or  . acetaminophen (TYLENOL) suppository 650 mg  650 mg Rectal Q6H PRN Maudie Mercury, MD      . atorvastatin (LIPITOR) tablet 80 mg  80 mg Oral Daily Maudie Mercury, MD   80 mg at 12/25/20 0818  . enoxaparin (LOVENOX) injection 40 mg  40 mg Subcutaneous Q24H Maudie Mercury, MD   40 mg at 12/24/20 1300  . ezetimibe (ZETIA) tablet 10 mg  10 mg Oral Daily Maudie Mercury, MD   10 mg at 12/25/20 0818  . FLUoxetine (PROZAC) capsule 20 mg  20 mg Oral Daily Maudie Mercury, MD   20 mg at 12/25/20 0818  . polyethylene glycol (MIRALAX / GLYCOLAX) packet 17 g  17 g Oral Daily PRN Maudie Mercury, MD        Musculoskeletal: Strength & Muscle Tone: Gait & Station: normal Patient leans: N/A  Psychiatric Specialty Exam: Physical Exam  Review of Systems  Blood pressure (!) 170/110, pulse 82, temperature 98.2 F (36.8 C), temperature source Oral, resp. rate 20, height 5\' 9"  (1.753 m), weight 88.6 kg, SpO2 100 %.Body mass index is 28.84 kg/m.  General Appearance: Fairly Groomed  Eye Contact:  Poor  Speech:  Clear and Coherent and Slow  Volume:  Normal  Mood:  Anxious and Dysphoric  Affect:  Blunt, Depressed and Flat  Thought Process:  Linear and Descriptions of Associations: Intact  Orientation:  Full (Time, Place, and Person)  Thought Content:  Logical  Suicidal Thoughts:  No denies this to me but told another provider.   Homicidal Thoughts:  No   Memory:  Immediate;   Fair Recent;   Fair  Judgement:  Poor  Insight:  Shallow  Psychomotor Activity:  Normal  Concentration:  Concentration: Fair and Attention Span: Fair  Recall:  [employer] of Knowledge:  Fair  Language:  Fair  Akathisia:  NA  Handed:  Right  AIMS (if indicated):     Assets:  Communication Skills Desire for Improvement Financial Resources/Insurance Leisure Time Physical Health  ADL's:  Intact  Cognition:  WNL  Sleep:        Treatment Plan Summary: Plan Increase Prozac 30mg  po daily, recently started taking this medication. Will recommend working closely with SW to get him into an inpatient psych hospital. Patient has a history of leaving AMA. Will need to be placed under IVC if he attempts to leave.  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  -IVC if needed.  -Recommend working closely with SW to facilitate inpatient admission to psych hospital once he is medically stable.  -Will increase Prozac 30mg  po daily for depression.   Disposition: Recommend psychiatric Inpatient admission when medically cleared.  Suella Broad, FNP 12/25/2020 12:07 PM

## 2020-12-25 NOTE — Progress Notes (Signed)
Patient states he needs his medications from home to control his blood pressure readings. Current reading is 170/110 with pulse of 82. He states he takes carvedilol, hydralazine, glipizide, lisinopril, and furosemide. Patient does not know the doses of these medications. Patient is experiencing high anxiety and states his pain scale is 7/10 location in mid chest. Patients current oxygen saturation is 100% [facility] on 4L of oxygen. Notified Dr. Bridgett Larsson.   Daymon Larsen, RN

## 2020-12-25 NOTE — Progress Notes (Addendum)
HD#1 Subjective:   No acute events overnight.  During evaluation at bedside this morning, the patient states he had a "rough" night. Reports waking up periodically "unable to breathe." States he was scared to fall asleep at times. Expresses frustration that he had such improvement yesterday only to feel the "same as before coming in" overnight. Shared the results of his echocardiogram yesterday. The patient asks several questions, expresses feeling overwhelmed with everything. States multiple times, "so what am I supposed to do now?" Discussed that fluid can build up behind his heart into his lungs, contributing to his symptoms. Discussed that uncontrolled hypertension can stress the heart. He expresses concern that his blood pressure has been elevated. Counseled him regarding the importance of not bringing his BP down too quickly. He states, "I just can't handle this right now." When asked if he has a cardiologist, he laughs inappropriately and says, "of course not." He cannot recall having had a previous heart catheterization. Shared with him that we would ask our cardiology colleagues to evaluate him as well. Also discussed that psychiatry has been consulted to see him.  Objective:   Vital signs in last 24 hours: Vitals:   12/24/20 2030 12/24/20 2100 12/24/20 2359 12/25/20 0312  BP:   (!) 165/112 (!) 179/116  Pulse: 72  76 72  Resp:  17 18 14   Temp:  98.5 F (36.9 C) 98.4 F (36.9 C) 98.4 F (36.9 C)  TempSrc:  Oral Oral Oral  SpO2: 99%  99% 100%  Weight:  88.6 kg    Height:       Supplemental O2: Nasal Cannula SpO2: 95 % O2 Flow Rate (L/min): 2 L/min FiO2 (%): 100 %  Physical Exam Constitutional: tired-appearing man sitting up in bed, in no acute distress, fidgety Cardiovascular: regular rate and rhythm, no m/r/g, no lower extremity edema Pulmonary/Chest: normal work of breathing on 2 L [place], lungs with faint bibasilar crackles, no wheezes or rhonchi Skin: warm and  dry Psych: labile affect  Filed Weights   12/24/20 0348 12/24/20 2100  Weight: 102 kg 88.6 kg    Intake/Output Summary (Last 24 hours) at 12/25/2020 1536 Last data filed at 12/25/2020 1300 Gross per 24 hour  Intake 240 ml  Output 350 ml  Net -110 ml   Net IO Since Admission: -3,060 mL [12/25/20 1536]  Pertinent Labs: CBC Latest Ref Rng & Units 12/24/2020 12/24/2020 12/24/2020  WBC 4.0 - 10.5 K/uL - - 8.5  Hemoglobin 13.0 - 17.0 g/dL 15.3 16.3 16.2  Hematocrit 39.0 - 52.0 % 45.0 48.0 46.6  Platelets 150 - 400 K/uL - - 210    CMP Latest Ref Rng & Units 12/25/2020 12/24/2020 12/24/2020  Glucose 70 - 99 mg/dL 115(H) - -  BUN 6 - 20 mg/dL 9 - -  Creatinine 0.61 - 1.24 mg/dL 1.34(H) - -  Sodium 135 - 145 mmol/L 138 142 142  Potassium 3.5 - 5.1 mmol/L 3.8 3.1(L) 3.5  Chloride 98 - 111 mmol/L 105 - -  CO2 22 - 32 mmol/L 23 - -  Calcium 8.9 - 10.3 mg/dL 9.2 - -  Total Protein 6.5 - 8.1 g/dL - - -  Total Bilirubin 0.3 - 1.2 mg/dL - - -  Alkaline Phos 38 - 126 U/L - - -  AST 15 - 41 U/L - - -  ALT 0 - 44 U/L - - -    Imaging: CT HEAD WO CONTRAST  Result Date: 12/24/2020 CLINICAL DATA:  Acute neuro deficit.  EXAM: CT HEAD WITHOUT CONTRAST TECHNIQUE: Contiguous axial images were obtained from the base of the skull through the vertex without intravenous contrast. COMPARISON:  Head CT dated 01/31/2017. FINDINGS: Brain: Ventricles are within normal limits in size. 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 There is no mass, hemorrhage, edema or other evidence of acute parenchymal abnormality. No extra-axial hemorrhage. Vascular: No hyperdense vessel or unexpected calcification. Skull: Normal. Negative for fracture or focal lesion. Sinuses/Orbits: No acute finding. Other: None. IMPRESSION: 1. No acute findings. No intracranial mass, hemorrhage or edema. 2. 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 Electronically Signed    By: Franki Cabot M.D.   On: 12/24/2020 10:16   ECHOCARDIOGRAM COMPLETE  Result Date: 12/24/2020    ECHOCARDIOGRAM REPORT   Patient Name:   Matthew [name]. Date of Exam: 12/24/2020 Medical Rec #:  [id number]           Height:       69.0 in Accession #:    [id number]          Weight:       224.9 lb Date of Birth:  [birth year]           BSA:          2.172 m Patient Age:    55 years            BP:           170/111 mmHg Patient Gender: M                   HR:           65 bpm. Exam Location:  Inpatient Procedure: 2D Echo, Cardiac Doppler and Color Doppler Indications:    R07.9* Chest pain, unspecified  History:        Patient has prior history of Echocardiogram examinations, most                 recent 02/15/2020. Cardiomyopathy and CHF; Risk Factors:Current                 Smoker, Diabetes, Hypertension and Dyslipidemia.  Sonographer:    Jonelle Sidle Dance Referring Phys: [id number] ANKIT NANAVATI IMPRESSIONS  1. Left ventricular ejection fraction, by estimation, is <20%. The left ventricle has severely decreased function. The left ventricle demonstrates global hypokinesis. The left ventricular internal cavity size was moderately dilated. Left ventricular diastolic parameters are consistent with Grade III diastolic dysfunction (restrictive).  2. Right ventricular systolic function is moderately reduced. The right ventricular size is normal. There is normal pulmonary artery systolic pressure.  3. Left atrial size was severely dilated.  4. The mitral valve is normal in structure. Mild mitral valve regurgitation. No evidence of mitral stenosis.  5. The aortic valve is tricuspid. Aortic valve regurgitation is not visualized. No aortic stenosis is present.  6. Aortic dilatation noted. There is mild dilatation of the ascending aorta, measuring 38 mm.  7. The inferior vena cava is dilated in size with >50% respiratory variability, suggesting right atrial pressure of 8 mmHg. FINDINGS  Left Ventricle: Left ventricular ejection  fraction, by estimation, is <20%. The left ventricle has severely decreased function. The left ventricle demonstrates global hypokinesis. The left ventricular internal cavity size was moderately dilated. There is no left ventricular hypertrophy. Left ventricular diastolic parameters are consistent with Grade III diastolic dysfunction (restrictive). Right Ventricle: The right ventricular size is normal. No increase in right ventricular wall thickness. Right  ventricular systolic function is moderately reduced. There is normal pulmonary artery systolic pressure. The tricuspid regurgitant velocity is 2.04 m/s, and with an assumed right atrial pressure of 8 mmHg, the estimated right ventricular systolic pressure is 73.7 mmHg. Left Atrium: Left atrial size was severely dilated. Right Atrium: Right atrial size was normal in size. Pericardium: Trivial pericardial effusion is present. Mitral Valve: The mitral valve is normal in structure. Mild mitral valve regurgitation. No evidence of mitral valve stenosis. Tricuspid Valve: The tricuspid valve is normal in structure. Tricuspid valve regurgitation is trivial. No evidence of tricuspid stenosis. Aortic Valve: The aortic valve is tricuspid. Aortic valve regurgitation is not visualized. No aortic stenosis is present. Pulmonic Valve: The pulmonic valve was normal in structure. Pulmonic valve regurgitation is trivial. No evidence of pulmonic stenosis. Aorta: Aortic dilatation noted. There is mild dilatation of the ascending aorta, measuring 38 mm. Venous: The inferior vena cava is dilated in size with greater than 50% respiratory variability, suggesting right atrial pressure of 8 mmHg. IAS/Shunts: No atrial level shunt detected by color flow Doppler.  LEFT VENTRICLE PLAX 2D LVIDd:         6.50 cm LVIDs:         6.10 cm LV PW:         1.70 cm LV IVS:        1.50 cm LVOT diam:     2.40 cm LV SV:         66 LV SV Index:   31 LVOT Area:     4.52 cm  RIGHT VENTRICLE            IVC RV  Basal diam:  2.80 cm    IVC diam: 2.50 cm RV S prime:     6.68 cm/s TAPSE (M-mode): 1.8 cm LEFT ATRIUM              Index       RIGHT ATRIUM           Index LA diam:        5.90 cm  2.72 cm/m  RA Area:     15.20 cm LA Vol (A2C):   129.0 ml 59.41 ml/m RA Volume:   38.90 ml  17.91 ml/m LA Vol (A4C):   149.0 ml 68.62 ml/m LA Biplane Vol: 140.0 ml 64.47 ml/m  AORTIC VALVE LVOT Vmax:   86.30 cm/s LVOT Vmean:  54.050 cm/s LVOT VTI:    0.147 m  AORTA Ao Root diam: 3.40 cm Ao Asc diam:  3.80 cm MITRAL VALVE               TRICUSPID VALVE MV Area (PHT): 2.76 cm    TR Peak grad:   16.6 mmHg MV Decel Time: 275 msec    TR Vmax:        204.00 cm/s MV E velocity: 71.15 cm/s                            SHUNTS                            Systemic VTI:  0.15 m                            Systemic Diam: 2.40 cm Skeet Latch MD Electronically signed by Skeet Latch MD Signature Date/Time: 12/24/2020/4:19:52 PM    Final  Assessment/Plan:   Active Problems:   Hypertensive urgency   Acute exacerbation of congestive heart failure ([place])   Patient Summary:  Yandriel Behm. 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  This is hospital day 1.  Acute on Chronic Combined Systolic and Diastolic Heart Failure; now with worsening diastolic and biventricular heart failure Patient reports worsening of his dyspnea overnight as he was sleeping consistent with PND. Had good response to diuresis yesterday s/p 40 mg IV Lasix with UOP 2.1 L. On exam today, he is not obviously volume overloaded. Echocardiogram yesterday (12/24/20) showed LVEF <20% with global hypokinesis with moderately dilated LV, grade III diastolic dysfunction, no  LVH, moderately reduced RV systolic function, severely dilated left atrium. Suspect precipitated by medication non-adherence leading to uncontrolled hypertension. Ischemic cardiomyopathy is also on the differential, though his unremarkable EKG and trops are reassuring. Will consult cardiology to evaluation for possible ischemic evaluation with catheterization. Will check TSH, continue telemetry.  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 BP still elevated overnight, slightly above goal of 25% reduction in MAP in the first 24 hrs. As above, patient endorsing dyspnea overnight. Will resume home hydralazine 50 mg TID, lisinopril 5 mg daily, and carvedilol 12.5 mg BID. Would consider Entresto, though worry about affordability given patient does not have insurance. - Resume home hydralazine 50 mg TID - Resume home lisinopril 5 mg daily - Resume home carvedilol 12.5 mg BID   Depression Suicidal ideation Patient with ongoing depressive symptoms. Labile affect. Expresses hopelessness. Psych evaluation pending given his reported suicidal ideation on admission and access to firearms in his home. - Continue home fluoxetine 20 mg daily - Suicide precautions: 1:1 sitter - Psych consult, appreciate recs - Klonopin 0.25 mg qHS PRN  CKD Stage 2 sCr 1.41->1.34 (baseline ~1.40-1.70)  Will follow daily BMP while diuresing. - AM BMP  Type II diabetes mellitus Prescribed glipizide 5 mg twice daily with meals. Reports non-adherence. Random BG 126 on arrival. Will hold home glipizide while in the hospital. - CBG monitoring - SSI   Hyperlipidemia - Continue home atorvastatin 80 mg daily - Continue home ezetimibe (Zetia) 10 mg daily    Diet:  Heart Healthy VTE: Enoxaparin IVF: None Code: DNR    Dispo: Anticipated discharge to  TBD  in 2-5 days pending further treatment and evaluation of new-onset biventricular heart failure.    Please contact the on call pager after 5 pm and on weekends at [phone].  Matthew Lodge, MD PGY-1 Internal Medicine Teaching Service Pager: [phone] 12/25/2020

## 2020-12-25 NOTE — Consult Note (Signed)
Cardiology Consultation:   Patient ID: Matthew [name]. MRN: [id number]; DOB: 25-Apr-1966  Admit date: 12/24/2020 Date of Consult: 12/25/2020  Primary Care Provider: Charlott Rakes, MD [place] Cardiologist: No primary care provider on file. none [facility] Electrophysiologist:  None    Patient Profile:   Matthew [name]. is a 55 y.o. male with a hx of HTN and mixed CHF who is being seen today for the evaluation of acute systolic heart failure at the request of Dr. Philipp Ovens.  History of Present Illness:   Matthew [name] has a h/o HTN urgency and mild LV dysfunction who was in his usual state of health until yesterday. He suddenly became sob and presented to the ED for evaluation. He denies medical or dietary indiscretion. He was treated with IV lasix and has had a nice diuresis. He is still dyspneic. Note problems wit depression over his wife's death and psychiatry consult.    Past Medical History:  Diagnosis Date  . Acute combined systolic and diastolic CHF, NYHA class 3 ([place])    a. 02/2014 Echo: EF 25-30%.  . Cardiomyopathy ([place])    a. 02/2014 Echo: EF 25-30%, sev glob HK with inferolat HK->AK, mod conc LVH, Gr 2 DD, Mild MR, sev dil LA.  . Depression   . DM (diabetes mellitus) ([place])   . High cholesterol   . Hypertension   . Morbid obesity ([place])   . Tobacco abuse     Past Surgical History:  Procedure Laterality Date  . CATARACT EXTRACTION Right   . LEFT HEART CATHETERIZATION WITH CORONARY ANGIOGRAM N/A 03/04/2014   Procedure: LEFT HEART CATHETERIZATION WITH CORONARY ANGIOGRAM;  Surgeon: Burnell Blanks, MD;  Location: [facility] CATH LAB;  Service: Cardiovascular;  Laterality: N/A;  . TEE WITHOUT CARDIOVERSION N/A 02/04/2017   Procedure: TRANSESOPHAGEAL ECHOCARDIOGRAM (TEE);  Surgeon: Dorothy Spark, MD;  Location: [facility] ENDOSCOPY;  Service: Cardiovascular;  Laterality: N/A;     Home Medications:  Prior to Admission medications   Medication Sig Start Date End  Date Taking? Authorizing Provider  acetaminophen (TYLENOL) 500 MG tablet Take 500 mg by mouth daily as needed for mild pain or moderate pain.    Yes [provider]  atorvastatin (LIPITOR) 80 MG tablet Take 1 tablet (80 mg total) by mouth daily. 12/02/19  Yes Charlott Rakes, MD  carvedilol (COREG) 12.5 MG tablet Take 1 tablet (12.5 mg total) by mouth 2 (two) times daily with a meal. Must have office visit for refills 12/02/19  Yes Charlott Rakes, MD  ezetimibe (ZETIA) 10 MG tablet Take 1 tablet (10 mg total) by mouth daily. 12/02/19  Yes Charlott Rakes, MD  FLUoxetine (PROZAC) 20 MG tablet Take 1 tablet (20 mg total) by mouth daily. 12/02/19  Yes Newlin, Charlane Ferretti, MD  furosemide (LASIX) 40 MG tablet TAKE 1 TABLET (40 MG TOTAL) BY MOUTH DAILY. 12/13/20  Yes Newlin, Enobong, MD  glipiZIDE (GLUCOTROL) 5 MG tablet TAKE 1 TABLET (5 MG TOTAL) BY MOUTH 2 (TWO) TIMES DAILY BEFORE A MEAL. MUST HAVE OFFICE VISIT FOR REFILLS Patient taking differently: Take 5 mg by mouth 2 (two) times daily before a meal. 12/13/20  Yes Newlin, Enobong, MD  glucose blood (ONE TOUCH ULTRA TEST) test strip Use as instructed 12/31/16  Yes Newlin, Enobong, MD  hydrALAZINE (APRESOLINE) 50 MG tablet TAKE 1 TABLET (50 MG TOTAL) BY MOUTH 3 (THREE) TIMES DAILY. 12/13/20  Yes Newlin, Charlane Ferretti, MD  lisinopril (ZESTRIL) 5 MG tablet Take 1 tablet (5 mg total) by  mouth daily. 12/02/19  Yes Charlott Rakes, MD  timolol (TIMOPTIC) 0.5 % ophthalmic solution Place 1 drop into both eyes daily after breakfast. 06/21/20  Yes [provider]  aspirin 81 MG tablet Take 1 tablet (81 mg total) by mouth daily. Patient not taking: Reported on 11/18/2018 07/11/18   Charlott Rakes, MD  lovastatin (MEVACOR) 20 MG tablet Take 2 tablets (40 mg total) by mouth at bedtime. 06/24/14 08/26/14  Lorayne Marek, MD    Inpatient Medications: Scheduled Meds: . atorvastatin  80 mg Oral Daily  . enoxaparin (LOVENOX) injection  40 mg Subcutaneous Q24H  .  ezetimibe  10 mg Oral Daily  . [START ON 12/26/2020] FLUoxetine  30 mg Oral Daily  . hydrALAZINE  50 mg Oral TID  . lisinopril  5 mg Oral Daily   Continuous Infusions:  PRN Meds: acetaminophen **OR** acetaminophen, polyethylene glycol  Allergies:   No Known Allergies  Social History:   Social History   Socioeconomic History  . Marital status: Widowed    Spouse name: Not on file  . Number of children: 2  . Years of education: 51  . Highest education level: Not on file  Occupational History  . Occupation: [employer]  Tobacco Use  . Smoking status: Current Some Day Smoker    Years: 20.00    Types: Cigarettes  . Smokeless tobacco: Never Used  Vaping Use  . Vaping Use: Never used  Substance and Sexual Activity  . Alcohol use: No  . Drug use: No  . Sexual activity: Yes  Other Topics Concern  . Not on file  Social History Narrative   Lives with 2 young children in [place].  Wife died in 25-Apr-2020.  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   Left-handed   Caffeine: occasional tea   Social Determinants of Radio broadcast assistant Strain: Not on file  Food Insecurity: Not on file  Transportation Needs: Not on file  Physical Activity: Not on file  Stress: Not on file  Social Connections: Not on file  Intimate Partner Violence: Not on file    Family History:    Family History  Problem Relation Age of Onset  . Heart attack Father        died @ 35  . Cervical cancer Mother        died @ 58     ROS:  Please see the history of present illness.   All other ROS reviewed and negative.     Physical Exam/Data:   Vitals:   12/24/20 2100 12/24/20 2359 12/25/20 0312 12/25/20 0800  BP:  (!) 165/112 (!) 179/116 (!) 170/110  Pulse:  76 72 82  Resp: 17 18 14 20   Temp: 98.5 F (36.9 C) 98.4 F (36.9 C) 98.4 F (36.9 C) 98.2 F (36.8 C)  TempSrc: Oral Oral Oral Oral  SpO2:  99% 100% 100%  Weight: 88.6 kg     Height:        Intake/Output  Summary (Last 24 hours) at 12/25/2020 1326 Last data filed at 12/25/2020 0800 Gross per 24 hour  Intake --  Output 350 ml  Net -350 ml   Last 3 Weights 12/24/2020 12/24/2020 07/06/2020  Weight (lbs) 195 lb 4.8 oz 224 lb 13.9 oz 213 lb 10 oz  Weight (kg) 88.587 kg 102 kg 96.9 kg     Body mass index is 28.84 kg/m.  General:  Well nourished, well developed, in no acute distress  but quite anxious appearing but reassurable. HEENT: normal Lymph: no adenopathy Neck: 7 cm JVD Endocrine:  No thryomegaly Vascular: No carotid bruits; FA pulses 2+ bilaterally without bruits  Cardiac:  normal S1, S2; RRR; no murmur with soft s4 Lungs:  Rales in the bases Abd: soft, nontender, no hepatomegaly  Ext: no edema Musculoskeletal:  No deformities, BUE and BLE strength normal and equal Skin: warm and dry  Neuro:  CNs 2-12 intact, no focal abnormalities noted Psych:  Normal affect   EKG:  The EKG was personally reviewed and demonstrates:  nsr Telemetry:  Telemetry was personally reviewed and demonstrates:  nsr  Relevant CV Studies: 2D echo - EF less than 20% Laboratory Data:  High Sensitivity Troponin:   Recent Labs  Lab 12/24/20 0358 12/24/20 0608  TROPONINIHS 16 19*     Chemistry Recent Labs  Lab 12/24/20 0358 12/24/20 0414 12/24/20 0614 12/25/20 0637  NA 141 142 142 138  K 3.6 3.5 3.1* 3.8  CL 107  --   --  105  CO2 23  --   --  23  GLUCOSE 126*  --   --  115*  BUN 8  --   --  9  CREATININE 1.41*  --   --  1.34*  CALCIUM 9.0  --   --  9.2  GFRNONAA 60*  --   --  >60  ANIONGAP 11  --   --  10    Recent Labs  Lab 12/24/20 0358  PROT 6.6  ALBUMIN 3.6  AST 22  ALT 37  ALKPHOS 64  BILITOT 1.0   Hematology Recent Labs  Lab 12/24/20 0358 12/24/20 0414 12/24/20 0614  WBC 8.5  --   --   RBC 5.26  --   --   HGB 16.2 16.3 15.3  HCT 46.6 48.0 45.0  MCV 88.6  --   --   MCH 30.8  --   --   MCHC 34.8  --   --   RDW 12.9  --   --   PLT 210  --   --    BNP Recent Labs  Lab  12/24/20 0358  BNP 647.2*    DDimer No results for input(s): DDIMER in the last 168 hours.   Radiology/Studies:  CT HEAD WO CONTRAST  Result Date: 12/24/2020 CLINICAL DATA:  Acute neuro deficit. EXAM: CT HEAD WITHOUT CONTRAST TECHNIQUE: Contiguous axial images were obtained from the base of the skull through the vertex without intravenous contrast. COMPARISON:  Head CT dated 01/31/2017. FINDINGS: Brain: Ventricles are within normal limits in size. 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 There is no mass, hemorrhage, edema or other evidence of acute parenchymal abnormality. No extra-axial hemorrhage. Vascular: No hyperdense vessel or unexpected calcification. Skull: Normal. Negative for fracture or focal lesion. Sinuses/Orbits: No acute finding. Other: None. IMPRESSION: 1. No acute findings. No intracranial mass, hemorrhage or edema. 2. 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 Electronically Signed   By: Franki Cabot M.D.   On: 12/24/2020 10:16   DG Chest Port 1 View  Result Date: 12/24/2020 CLINICAL DATA:  Shortness of breath, chest congestion and dry cough EXAM: PORTABLE CHEST 1 VIEW COMPARISON:  Radiograph 10/03/2020, CT 02/04/2017 FINDINGS: Patchy ill-defined opacities are present throughout both lungs. Some mild vascular congestion is noted as well with cephalization indistinct vascularity and fissural thickening. Prominent cardiac silhouette though possibly accentuated by low volumes and portable technique. No pneumothorax. No  effusion. No acute osseous or soft tissue abnormality. Telemetry leads overlie the chest. IMPRESSION: 1. Ill-defined and patchy opacities throughout both lungs with some mild vascular congestion could reflect edema and/or infection in the appropriate clinical context. Electronically Signed   By: Lovena Le M.D.   On: 12/24/2020 04:25   ECHOCARDIOGRAM COMPLETE  Result Date:  12/24/2020    ECHOCARDIOGRAM REPORT   Patient Name:   Matthew [name]. Date of Exam: 12/24/2020 Medical Rec #:  [id number]           Height:       69.0 in Accession #:    [id number]          Weight:       224.9 lb Date of Birth:  March 17, 1966           BSA:          2.172 m Patient Age:    81 years            BP:           170/111 mmHg Patient Gender: M                   HR:           65 bpm. Exam Location:  Inpatient Procedure: 2D Echo, Cardiac Doppler and Color Doppler Indications:    R07.9* Chest pain, unspecified  History:        Patient has prior history of Echocardiogram examinations, most                 recent 02/15/2020. Cardiomyopathy and CHF; Risk Factors:Current                 Smoker, Diabetes, Hypertension and Dyslipidemia.  Sonographer:    Jonelle Sidle Dance Referring Phys: [id number] ANKIT NANAVATI IMPRESSIONS  1. Left ventricular ejection fraction, by estimation, is <20%. The left ventricle has severely decreased function. The left ventricle demonstrates global hypokinesis. The left ventricular internal cavity size was moderately dilated. Left ventricular diastolic parameters are consistent with Grade III diastolic dysfunction (restrictive).  2. Right ventricular systolic function is moderately reduced. The right ventricular size is normal. There is normal pulmonary artery systolic pressure.  3. Left atrial size was severely dilated.  4. The mitral valve is normal in structure. Mild mitral valve regurgitation. No evidence of mitral stenosis.  5. The aortic valve is tricuspid. Aortic valve regurgitation is not visualized. No aortic stenosis is present.  6. Aortic dilatation noted. There is mild dilatation of the ascending aorta, measuring 38 mm.  7. The inferior vena cava is dilated in size with >50% respiratory variability, suggesting right atrial pressure of 8 mmHg. FINDINGS  Left Ventricle: Left ventricular ejection fraction, by estimation, is <20%. The left ventricle has severely decreased function. The left  ventricle demonstrates global hypokinesis. The left ventricular internal cavity size was moderately dilated. There is no left ventricular hypertrophy. Left ventricular diastolic parameters are consistent with Grade III diastolic dysfunction (restrictive). Right Ventricle: The right ventricular size is normal. No increase in right ventricular wall thickness. Right ventricular systolic function is moderately reduced. There is normal pulmonary artery systolic pressure. The tricuspid regurgitant velocity is 2.04 m/s, and with an assumed right atrial pressure of 8 mmHg, the estimated right ventricular systolic pressure is 123456 mmHg. Left Atrium: Left atrial size was severely dilated. Right Atrium: Right atrial size was normal in size. Pericardium: Trivial pericardial effusion is present. Mitral Valve: The mitral valve is normal in structure.  Mild mitral valve regurgitation. No evidence of mitral valve stenosis. Tricuspid Valve: The tricuspid valve is normal in structure. Tricuspid valve regurgitation is trivial. No evidence of tricuspid stenosis. Aortic Valve: The aortic valve is tricuspid. Aortic valve regurgitation is not visualized. No aortic stenosis is present. Pulmonic Valve: The pulmonic valve was normal in structure. Pulmonic valve regurgitation is trivial. No evidence of pulmonic stenosis. Aorta: Aortic dilatation noted. There is mild dilatation of the ascending aorta, measuring 38 mm. Venous: The inferior vena cava is dilated in size with greater than 50% respiratory variability, suggesting right atrial pressure of 8 mmHg. IAS/Shunts: No atrial level shunt detected by color flow Doppler.  LEFT VENTRICLE PLAX 2D LVIDd:         6.50 cm LVIDs:         6.10 cm LV PW:         1.70 cm LV IVS:        1.50 cm LVOT diam:     2.40 cm LV SV:         66 LV SV Index:   31 LVOT Area:     4.52 cm  RIGHT VENTRICLE            IVC RV Basal diam:  2.80 cm    IVC diam: 2.50 cm RV S prime:     6.68 cm/s TAPSE (M-mode): 1.8 cm LEFT  ATRIUM              Index       RIGHT ATRIUM           Index LA diam:        5.90 cm  2.72 cm/m  RA Area:     15.20 cm LA Vol (A2C):   129.0 ml 59.41 ml/m RA Volume:   38.90 ml  17.91 ml/m LA Vol (A4C):   149.0 ml 68.62 ml/m LA Biplane Vol: 140.0 ml 64.47 ml/m  AORTIC VALVE LVOT Vmax:   86.30 cm/s LVOT Vmean:  54.050 cm/s LVOT VTI:    0.147 m  AORTA Ao Root diam: 3.40 cm Ao Asc diam:  3.80 cm MITRAL VALVE               TRICUSPID VALVE MV Area (PHT): 2.76 cm    TR Peak grad:   16.6 mmHg MV Decel Time: 275 msec    TR Vmax:        204.00 cm/s MV E velocity: 71.15 cm/s                            SHUNTS                            Systemic VTI:  0.15 m                            Systemic Diam: 2.40 cm Skeet Latch MD Electronically signed by Skeet Latch MD Signature Date/Time: 12/24/2020/4:19:52 PM    Final      Assessment and Plan:   1. Acute on chronic systolic heart failure - He has had a nice initial diuresis. He will continue IV lasix.  2. Anxiety - note psychiatry service input.  3. HTN - his sbp remains elevated. He is encouraged to reduce his salt intake. I would uptitrate his coreg to 25 bid.     For questions or updates, please  contact [place] Please consult [email or web address] for contact info under    Signed, Cristopher Peru, MD  12/25/2020 1:26 PM

## 2020-12-25 NOTE — Progress Notes (Signed)
Patient states he needs sleep but is scared to go to sleep because when he wakes up he is anxious.  Daymon Larsen, RN

## 2020-12-26 DIAGNOSIS — E119 Type 2 diabetes mellitus without complications: Secondary | ICD-10-CM

## 2020-12-26 DIAGNOSIS — I160 Hypertensive urgency: Secondary | ICD-10-CM

## 2020-12-26 LAB — CBC WITH DIFFERENTIAL/PLATELET
Abs Immature Granulocytes: 0 10*3/uL (ref 0.00–0.07)
Basophils Absolute: 0 10*3/uL (ref 0.0–0.1)
Basophils Relative: 1 %
Eosinophils Absolute: 0.1 10*3/uL (ref 0.0–0.5)
Eosinophils Relative: 2 %
HCT: 44 % (ref 39.0–52.0)
Hemoglobin: 15.6 g/dL (ref 13.0–17.0)
Immature Granulocytes: 0 %
Lymphocytes Relative: 35 %
Lymphs Abs: 1.9 10*3/uL (ref 0.7–4.0)
MCH: 31 pg (ref 26.0–34.0)
MCHC: 35.5 g/dL (ref 30.0–36.0)
MCV: 87.5 fL (ref 80.0–100.0)
Monocytes Absolute: 0.7 10*3/uL (ref 0.1–1.0)
Monocytes Relative: 12 %
Neutro Abs: 2.7 10*3/uL (ref 1.7–7.7)
Neutrophils Relative %: 50 %
Platelets: 197 10*3/uL (ref 150–400)
RBC: 5.03 MIL/uL (ref 4.22–5.81)
RDW: 12.8 % (ref 11.5–15.5)
WBC: 5.4 10*3/uL (ref 4.0–10.5)
nRBC: 0 % (ref 0.0–0.2)

## 2020-12-26 LAB — BASIC METABOLIC PANEL
Anion gap: 10 (ref 5–15)
BUN: 15 mg/dL (ref 6–20)
CO2: 25 mmol/L (ref 22–32)
Calcium: 8.8 mg/dL — ABNORMAL LOW (ref 8.9–10.3)
Chloride: 103 mmol/L (ref 98–111)
Creatinine, Ser: 1.62 mg/dL — ABNORMAL HIGH (ref 0.61–1.24)
GFR, Estimated: 50 mL/min — ABNORMAL LOW (ref >60)
Glucose, Bld: 112 mg/dL — ABNORMAL HIGH (ref 70–99)
Potassium: 3.3 mmol/L — ABNORMAL LOW (ref 3.5–5.1)
Sodium: 138 mmol/L (ref 135–145)

## 2020-12-26 LAB — MAGNESIUM: Magnesium: 1.8 mg/dL (ref 1.7–2.4)

## 2020-12-26 MED ORDER — LOSARTAN POTASSIUM 25 MG PO TABS: 25.0000 mg | ORAL_TABLET | Freq: Every day | ORAL | Status: DC

## 2020-12-26 MED ORDER — HYDRALAZINE HCL 50 MG PO TABS
75.0000 mg | ORAL_TABLET | Freq: Three times a day (TID) | ORAL | Status: DC
  Administered 2020-12-26 – 2020-12-28 (×6): 75 mg via ORAL
  Filled 2020-12-26 (×6): qty 1

## 2020-12-26 MED ORDER — MAGNESIUM SULFATE 2 GM/50ML IV SOLN
2.0000 g | Freq: Once | INTRAVENOUS | Status: AC
  Administered 2020-12-26: 2 g via INTRAVENOUS
  Filled 2020-12-26: qty 50

## 2020-12-26 MED ORDER — POTASSIUM CHLORIDE CRYS ER 20 MEQ PO TBCR
40.0000 meq | EXTENDED_RELEASE_TABLET | Freq: Two times a day (BID) | ORAL | Status: DC
  Administered 2020-12-26 – 2020-12-27 (×3): 40 meq via ORAL
  Filled 2020-12-26 (×3): qty 2

## 2020-12-26 MED ORDER — LOSARTAN POTASSIUM 25 MG PO TABS
25.0000 mg | ORAL_TABLET | Freq: Every day | ORAL | Status: DC
Start: 2020-12-27 — End: 2020-12-28
  Administered 2020-12-27: 25 mg via ORAL
  Filled 2020-12-26 (×2): qty 1

## 2020-12-26 MED ORDER — FUROSEMIDE 40 MG PO TABS
40.0000 mg | ORAL_TABLET | Freq: Every day | ORAL | Status: DC
  Administered 2020-12-26 – 2021-01-03 (×9): 40 mg via ORAL
  Filled 2020-12-26 (×9): qty 1

## 2020-12-26 NOTE — Progress Notes (Addendum)
Heart Failure Nurse Navigator Progress Note  PCP: Charlott Rakes, MD PCP-Cardiologist: None Admission Diagnosis: [facility] Admitted from: home by self  Presentation:   Matthew [name]. presented with [facility] from home. Has run out of medications d/t financial issues. Sitter at bedside. Pt resting/sleeping facing window in fetal position. Pt willing to speak with care provider. States headache, bedside RN gave medication.   ECHO/ LVEF: <20%  Clinical Course:  Past Medical History:  Diagnosis Date  . Acute combined systolic and diastolic CHF, NYHA class 3 ([place])    a. 02/2014 Echo: EF 25-30%.  . Cardiomyopathy ([place])    a. 02/2014 Echo: EF 25-30%, sev glob HK with inferolat HK->AK, mod conc LVH, Gr 2 DD, Mild MR, sev dil LA.  . Depression   . DM (diabetes mellitus) ([place])   . High cholesterol   . Hypertension   . Morbid obesity ([place])   . Tobacco abuse       Social Determinants of Health    Financial Resource Strain: yes, pt states unable to afford medications or pay any bills at this time. Last date of employment was "around Thanksgiving". No insurance currently.  Food Insecurity: Yes, "no food at my house" "Can't get to the grocery store or pay for food"  Transportation Needs: yes, pt currently does not have transportation. Open for [employer] assistance.  Physical Activity: Pt states he does not do much at home.   Stress: yes; financial, emotional, health, family. Currently IVC sitter at bedside. Psych involved. DC plan pending.  Social Connections: pt states his two children (age 76 & 71) live with his brother-in-law because it is easier for them to get to school at [facility] [employer]. No support system. Lives alone.    High Risk Criteria for Readmission and/or Poor Patient Outcomes:  Heart failure hospital admissions (last 6 months): 2 with 3 ED visits for SHOB/CHF  No Show rate: 15%  Difficult social situation: yes, see above SDOH  Demonstrates medication adherence:  no, ran out of meds d/t cost/delievery/transportation issues.  Primary Language: English  Literacy level: Pt states he reads and writes; concern for comprehension retention d/t current mental/emotional state.  Barriers of Care:   Want and willingness to learn/retain- will f/u with patient this admission for more educational concerns.  No transportation- consulting CSW. Financial strain- needs assistance with "mortgage and all the bills" "food" and "meds". Consulting CSW.  Medication assistance- pt unable to afford meds or get to pharmacy. Plan for [facility] pharmacy when DC.    Considerations/Referrals:   Referral made to Heart Failure Pharmacist Stewardship: yes Referral made to RNCM/CSW: yes  Items for Follow-up on DC/TOC: 1. Substance abuse cessation. 2. Cardiology f/u appt.  3. Financial security issues. 4. Insurance needs. 5. Social support.  6. Food insecurity issues. 7. Safe DC plan.  Pricilla Holm, RN, BSN Heart Failure Nurse Navigator [phone]

## 2020-12-26 NOTE — Progress Notes (Addendum)
Heart Failure Stewardship Pharmacist Progress Note   PCP: Charlott Rakes, MD PCP-Cardiologist: No primary care provider on file.    HPI:  55 yo M with PMH of HTN, HLD, DM, CVA, and CHF. He presented to the ED for shortness of breath and noted he ran out of his medications. An ECHO was done on 12/24/20 and LVEF was <20% (was 40-45% in March 2021).  Current HF Medications: Furosemide 40 mg daily Carvedilol 25 mg BID Losartan 25 mg daily Hydralazine 75 mg TID  Prior to admission HF Medications:    Not taking these medications Furosemide 40 mg daily Carvedilol 12.5 mg BID Lisinopril 5 mg daily Hydralazine 50 mg TID  Pertinent Lab Values: . Serum creatinine 1.62, BUN 15, Potassium 3.3, Sodium 138, BNP 647, Magnesium 1.8   Vital Signs: . Weight: 194 lbs (admission weight: 195 lbs) . Blood pressure: 140-160/90-100s  . Heart rate: 50-60s   Medication Assistance / Insurance Benefits Check: Does the patient have prescription insurance?  No  Does the patient qualify for medication assistance through manufacturers or grants?   Yes . Eligible grants and/or patient assistance programs: [employer] . Medication assistance applications in progress: none  . Medication assistance applications approved: none Approved medication assistance renewals will be completed by: [place]:  Prior to admission outpatient pharmacy: [facility] Is the patient willing to use [place] at discharge? Yes Is the patient willing to transition their outpatient pharmacy to utilize a [facility] outpatient pharmacy?   Pending    Assessment: 1. Acute on chronic systolic CHF (EF <95%), due to NICM, medication noncompliance. NYHA class II symptoms. - Continue furosemide 40 mg daily - Continue carvedilol 25 mg BID - Agree with transitioning to losartan 25 mg daily - hope to transition to [facility] prior to discharge - Consider starting spironolactone if  SCr improves - Consider starting SGLT2i (Farxiga/Jardiance) prior to discharge - Continue hydralazine 75 mg TID - Consider adding Imdur for hydral/nitrate benefit as a combination (can always consolidate to BiDil for less pill burden)   Plan: 1) Medication changes recommended at this time: - Agree with changes as noted above  2) Patient assistance application(s): - Patient can qualify for both Entresto and Farxiga/Jardiance patient assistance due to not having insurance. These programs would provide the medications for free to him so cost will not be an issue.  3)  Education  - To be completed prior to discharge  Kerby Nora, PharmD, BCPS Heart Failure Stewardship Pharmacist Phone [phone]

## 2020-12-26 NOTE — Progress Notes (Signed)
HD#2 Subjective:   Overnight Events: No acute events overnight  Patient states he slept well overnight and that it was the first night he slept all the way through the night in a "long time." He notes feeling better overnight, denies shortness of breath or chest pain. He did not remember talking with psychiatry yesterday. I updated him that psych recommendations were to have him admitted to inpatient psych services. It was further explained to him that once he is stable medically, he will be transferred to another facility for psychiatric management.   Objective:   Vital signs in last 24 hours: Vitals:   12/26/20 0715 12/26/20 0833 12/26/20 1229 12/26/20 1524  BP: (!) 164/107 (!) 156/94 (!) 147/92 (!) 131/97  Pulse: 64  67 70  Resp: 20  18 18   Temp: 97.6 F (36.4 C)  97.7 F (36.5 C) 97.9 F (36.6 C)  TempSrc: Oral  Oral Oral  SpO2: 97%  97% 99%  Weight:      Height:       Supplemental O2: Nasal Cannula SpO2: 95 % O2 Flow Rate (L/min): 2 L/min FiO2 (%): 100 %  Physical Exam Physical Exam Vitals and nursing note reviewed.  Constitutional:      General: He is not in acute distress.    Appearance: He is not ill-appearing, toxic-appearing or diaphoretic.  HENT:     Head: Normocephalic and atraumatic.  Cardiovascular:     Rate and Rhythm: Normal rate and regular rhythm.     Pulses: Normal pulses.     Heart sounds: Normal heart sounds. No murmur heard. No gallop.   Pulmonary:     Effort: Pulmonary effort is normal. No respiratory distress.     Breath sounds: Normal breath sounds. No stridor. No wheezing or rales.  Musculoskeletal:     Right lower leg: No edema.     Left lower leg: No edema.  Skin:    General: Skin is warm and dry.  Neurological:     General: No focal deficit present.     Mental Status: He is alert and oriented to person, place, and time. Mental status is at baseline.  Psychiatric:        Mood and Affect: Affect is blunt.     Comments: Lack of  eye contact. Inappropriate laughter     Filed Weights   12/24/20 0348 12/24/20 2100 12/26/20 0100  Weight: 102 kg 88.6 kg 88.1 kg    Intake/Output Summary (Last 24 hours) at 12/26/2020 1613 Last data filed at 12/26/2020 1525 Gross per 24 hour  Intake 1020 ml  Output 2725 ml  Net -1705 ml   Net IO Since Admission: -5,162.11 mL [12/26/20 1613]  Pertinent Labs: CBC Latest Ref Rng & Units 12/26/2020 12/24/2020 12/24/2020  WBC 4.0 - 10.5 K/uL 5.4 - -  Hemoglobin 13.0 - 17.0 g/dL 15.6 15.3 16.3  Hematocrit 39.0 - 52.0 % 44.0 45.0 48.0  Platelets 150 - 400 K/uL 197 - -    CMP Latest Ref Rng & Units 12/26/2020 12/25/2020 12/24/2020  Glucose 70 - 99 mg/dL 112(H) 115(H) -  BUN 6 - 20 mg/dL 15 9 -  Creatinine 0.61 - 1.24 mg/dL 1.62(H) 1.34(H) -  Sodium 135 - 145 mmol/L 138 138 142  Potassium 3.5 - 5.1 mmol/L 3.3(L) 3.8 3.1(L)  Chloride 98 - 111 mmol/L 103 105 -  CO2 22 - 32 mmol/L 25 23 -  Calcium 8.9 - 10.3 mg/dL 8.8(L) 9.2 -  Total Protein 6.5 - 8.1  g/dL - - -  Total Bilirubin 0.3 - 1.2 mg/dL - - -  Alkaline Phos 38 - 126 U/L - - -  AST 15 - 41 U/L - - -  ALT 0 - 44 U/L - - -    Imaging: No results found.   12/24/20 Echocardiogram IMPRESSIONS  1. Left ventricular ejection fraction, by estimation, is <20%. The left  ventricle has severely decreased function. The left ventricle demonstrates  global hypokinesis. The left ventricular internal cavity size was  moderately dilated. Left ventricular  diastolic parameters are consistent with Grade III diastolic dysfunction  (restrictive).  2. Right ventricular systolic function is moderately reduced. The right  ventricular size is normal. There is normal pulmonary artery systolic  pressure.  3. Left atrial size was severely dilated.  4. The mitral valve is normal in structure. Mild mitral valve  regurgitation. No evidence of mitral stenosis.  5. The aortic valve is tricuspid. Aortic valve regurgitation is not  visualized. No aortic  stenosis is present.  6. Aortic dilatation noted. There is mild dilatation of the ascending  aorta, measuring 38 mm.  7. The inferior vena cava is dilated in size with >50% respiratory  variability, suggesting right atrial pressure of 8 mmHg.   Assessment/Plan:   Active Problems:   Hypertensive urgency   Acute exacerbation of congestive heart failure ([place])  Patient Summary:  Matthew [name]. 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  This is hospital day 2.  Acute on Chronic Combined Systolic and Diastolic Heart Failure; now with worsening diastolic and biventricular heart failure Patient appears euvolemic on examination. In setting of cardiac workup thus far, appears to be of non-ischemic etiology. Patient has had flat troponin trends, NSR. Echo revealed EF <20% with diffuse hypokinesis. Prior echo 02/2020, EF of 40-45%. Cardiology consulted and following, suspect nonischemic cardiomyopathy.  - Cardiology recs  Ct coreg 25 mg BID  Transition lisinopril to losartan  If renal function stable, add spiro  Consider SGLT2  Increase Hydral 75 mg TID - s/p 40 mg IV Lasix on admission, reassess if needs additional - I/O -1.4 L yesterday - TSH - 2.81 - Mg 1.8 goal above 2.0 , 2g mg sulf today - K 3.3, goal above 4.0, 40 meq KPhos 40 meq BID daily - Monitor and replete Mg > 2 and K > 4 - Strict I&O, daily weights - Continue antihypertensives as per below  Severe Symptomatic Hypertension 2/2 Medication Non-Adherence BP continues to be elevated, 150/100 this am. Cardiology recs as per above - continue carvedilol - DC lisinopril, start losartan - Increase Hydral to 75 mg TID - Consider  entresto  Depression Suicidal ideation Evaluated by psych yesterday. Recommend inpatient psych once medically cleared.  - Work with social work to start process of inpatient psych placement - Home fluoxetine increased from 20 to 30 mg daily - Suicide precautions: 1:1 sitter - Klonopin 0.25 mg qHS PRN  CKD Stage 2 sCr 1.34->1.62 (baseline ~1.40-1.70)  Will follow daily BMP while diuresing. - AM BMP  Type II diabetes mellitus Prescribed glipizide 5 mg twice daily with meals. Reports non-adherence. Random BG 126 on arrival. Will hold home glipizide while in the hospital. - CBG monitoring - SSI   Hyperlipidemia -  Continue home atorvastatin 80 mg daily - Continue home ezetimibe (Zetia) 10 mg daily   Diet: Heart Healthy VTE: Enoxaparin IVF: None Code: DNR  Dispo: Anticipated discharge to inpatient psych pending medical clearance  Please contact the on call pager after 5 pm and on weekends at [phone].  Sanjuana Letters DO  PGY-1 Internal Medicine Teaching Service Pager: [phone] 12/26/2020

## 2020-12-26 NOTE — Progress Notes (Addendum)
Progress Note  Patient Name: Matthew [name]. Date of Encounter: 12/26/2020  [facility] Cardiologist: No primary care provider on file.   Subjective   Feels weak and fatigued. Worried about bills at home.   Inpatient Medications    Scheduled Meds: . atorvastatin  80 mg Oral Daily  . carvedilol  25 mg Oral BID WC  . enoxaparin (LOVENOX) injection  40 mg Subcutaneous Q24H  . ezetimibe  10 mg Oral Daily  . FLUoxetine  30 mg Oral Daily  . hydrALAZINE  50 mg Oral TID  . lisinopril  5 mg Oral Daily  . potassium chloride  40 mEq Oral BID   Continuous Infusions: . magnesium sulfate bolus IVPB 2 g (12/26/20 0852)   PRN Meds: acetaminophen **OR** acetaminophen, clonazePAM, polyethylene glycol   Vital Signs    Vitals:   12/26/20 0100 12/26/20 0309 12/26/20 0715 12/26/20 0833  BP:  (!) 150/100 (!) 164/107 (!) 156/94  Pulse:  68 64   Resp:  18 20   Temp:  98.7 F (37.1 C) 97.6 F (36.4 C)   TempSrc:  Oral Oral   SpO2:  97% 97%   Weight: 88.1 kg     Height:        Intake/Output Summary (Last 24 hours) at 12/26/2020 0855 Last data filed at 12/26/2020 [id number] Gross per 24 hour  Intake 482.89 ml  Output 2400 ml  Net -1917.11 ml   Last 3 Weights 12/26/2020 12/24/2020 12/24/2020  Weight (lbs) 194 lb 3.2 oz 195 lb 4.8 oz 224 lb 13.9 oz  Weight (kg) 88.089 kg 88.587 kg 102 kg      Telemetry    SR-->PVCs - Personally Reviewed  ECG    No new tracing  Physical Exam  Pleasant male, sitting up in bed. Sitter at the bedside.  GEN: No acute distress.   Neck: No JVD Cardiac: RRR, no murmurs, rubs, or gallops.  Respiratory: Clear to auscultation bilaterally. GI: Soft, nontender, non-distended  MS: No edema; No deformity. Neuro:  Nonfocal  Psych: Normal affect   Labs    High Sensitivity Troponin:   Recent Labs  Lab 12/24/20 0358 12/24/20 0608  TROPONINIHS 16 19*      Chemistry Recent Labs  Lab 12/24/20 0358 12/24/20 0414 12/24/20 0614 12/25/20 0637  12/26/20 0125  NA 141   < > 142 138 138  K 3.6   < > 3.1* 3.8 3.3*  CL 107  --   --  105 103  CO2 23  --   --  23 25  GLUCOSE 126*  --   --  115* 112*  BUN 8  --   --  9 15  CREATININE 1.41*  --   --  1.34* 1.62*  CALCIUM 9.0  --   --  9.2 8.8*  PROT 6.6  --   --   --   --   ALBUMIN 3.6  --   --   --   --   AST 22  --   --   --   --   ALT 37  --   --   --   --   ALKPHOS 64  --   --   --   --   BILITOT 1.0  --   --   --   --   GFRNONAA 60*  --   --  >60 50*  ANIONGAP 11  --   --  10 10   < > =  values in this interval not displayed.     Hematology Recent Labs  Lab 12/24/20 0358 12/24/20 0414 12/24/20 0614 12/26/20 0125  WBC 8.5  --   --  5.4  RBC 5.26  --   --  5.03  HGB 16.2 16.3 15.3 15.6  HCT 46.6 48.0 45.0 44.0  MCV 88.6  --   --  87.5  MCH 30.8  --   --  31.0  MCHC 34.8  --   --  35.5  RDW 12.9  --   --  12.8  PLT 210  --   --  197    BNP Recent Labs  Lab 12/24/20 0358  BNP 647.2*     DDimer No results for input(s): DDIMER in the last 168 hours.   Radiology    CT HEAD WO CONTRAST  Result Date: 12/24/2020 CLINICAL DATA:  Acute neuro deficit. EXAM: CT HEAD WITHOUT CONTRAST TECHNIQUE: Contiguous axial images were obtained from the base of the skull through the vertex without intravenous contrast. COMPARISON:  Head CT dated 01/31/2017. FINDINGS: Brain: Ventricles are within normal limits in size. 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 There is no mass, hemorrhage, edema or other evidence of acute parenchymal abnormality. No extra-axial hemorrhage. Vascular: No hyperdense vessel or unexpected calcification. Skull: Normal. Negative for fracture or focal lesion. Sinuses/Orbits: No acute finding. Other: None. IMPRESSION: 1. No acute findings. No intracranial mass, hemorrhage or edema. 2. 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 Electronically Signed   By: Franki Cabot M.D.   On:  12/24/2020 10:16   ECHOCARDIOGRAM COMPLETE  Result Date: 12/24/2020    ECHOCARDIOGRAM REPORT   Patient Name:   Dicky Chace. Date of Exam: 12/24/2020 Medical Rec #:  [id number]           Height:       69.0 in Accession #:    [id number]          Weight:       224.9 lb Date of Birth:  03/06/1966           BSA:          2.172 m Patient Age:    23 years            BP:           170/111 mmHg Patient Gender: M                   HR:           65 bpm. Exam Location:  Inpatient Procedure: 2D Echo, Cardiac Doppler and Color Doppler Indications:    R07.9* Chest pain, unspecified  History:        Patient has prior history of Echocardiogram examinations, most                 recent 02/15/2020. Cardiomyopathy and CHF; Risk Factors:Current                 Smoker, Diabetes, Hypertension and Dyslipidemia.  Sonographer:    Jonelle Sidle Dance Referring Phys: [id number] ANKIT NANAVATI IMPRESSIONS  1. Left ventricular ejection fraction, by estimation, is <20%. The left ventricle has severely decreased function. The left ventricle demonstrates global hypokinesis. The left ventricular internal cavity size was moderately dilated. Left ventricular diastolic parameters are consistent with Grade III diastolic dysfunction (restrictive).  2. Right ventricular systolic function is moderately reduced. The right ventricular size is normal. There is normal pulmonary  artery systolic pressure.  3. Left atrial size was severely dilated.  4. The mitral valve is normal in structure. Mild mitral valve regurgitation. No evidence of mitral stenosis.  5. The aortic valve is tricuspid. Aortic valve regurgitation is not visualized. No aortic stenosis is present.  6. Aortic dilatation noted. There is mild dilatation of the ascending aorta, measuring 38 mm.  7. The inferior vena cava is dilated in size with >50% respiratory variability, suggesting right atrial pressure of 8 mmHg. FINDINGS  Left Ventricle: Left ventricular ejection fraction, by estimation, is <20%. The  left ventricle has severely decreased function. The left ventricle demonstrates global hypokinesis. The left ventricular internal cavity size was moderately dilated. There is no left ventricular hypertrophy. Left ventricular diastolic parameters are consistent with Grade III diastolic dysfunction (restrictive). Right Ventricle: The right ventricular size is normal. No increase in right ventricular wall thickness. Right ventricular systolic function is moderately reduced. There is normal pulmonary artery systolic pressure. The tricuspid regurgitant velocity is 2.04 m/s, and with an assumed right atrial pressure of 8 mmHg, the estimated right ventricular systolic pressure is 28.7 mmHg. Left Atrium: Left atrial size was severely dilated. Right Atrium: Right atrial size was normal in size. Pericardium: Trivial pericardial effusion is present. Mitral Valve: The mitral valve is normal in structure. Mild mitral valve regurgitation. No evidence of mitral valve stenosis. Tricuspid Valve: The tricuspid valve is normal in structure. Tricuspid valve regurgitation is trivial. No evidence of tricuspid stenosis. Aortic Valve: The aortic valve is tricuspid. Aortic valve regurgitation is not visualized. No aortic stenosis is present. Pulmonic Valve: The pulmonic valve was normal in structure. Pulmonic valve regurgitation is trivial. No evidence of pulmonic stenosis. Aorta: Aortic dilatation noted. There is mild dilatation of the ascending aorta, measuring 38 mm. Venous: The inferior vena cava is dilated in size with greater than 50% respiratory variability, suggesting right atrial pressure of 8 mmHg. IAS/Shunts: No atrial level shunt detected by color flow Doppler.  LEFT VENTRICLE PLAX 2D LVIDd:         6.50 cm LVIDs:         6.10 cm LV PW:         1.70 cm LV IVS:        1.50 cm LVOT diam:     2.40 cm LV SV:         66 LV SV Index:   31 LVOT Area:     4.52 cm  RIGHT VENTRICLE            IVC RV Basal diam:  2.80 cm    IVC diam: 2.50  cm RV S prime:     6.68 cm/s TAPSE (M-mode): 1.8 cm LEFT ATRIUM              Index       RIGHT ATRIUM           Index LA diam:        5.90 cm  2.72 cm/m  RA Area:     15.20 cm LA Vol (A2C):   129.0 ml 59.41 ml/m RA Volume:   38.90 ml  17.91 ml/m LA Vol (A4C):   149.0 ml 68.62 ml/m LA Biplane Vol: 140.0 ml 64.47 ml/m  AORTIC VALVE LVOT Vmax:   86.30 cm/s LVOT Vmean:  54.050 cm/s LVOT VTI:    0.147 m  AORTA Ao Root diam: 3.40 cm Ao Asc diam:  3.80 cm MITRAL VALVE  TRICUSPID VALVE MV Area (PHT): 2.76 cm    TR Peak grad:   16.6 mmHg MV Decel Time: 275 msec    TR Vmax:        204.00 cm/s MV E velocity: 71.15 cm/s                            SHUNTS                            Systemic VTI:  0.15 m                            Systemic Diam: 2.40 cm Skeet Latch MD Electronically signed by Skeet Latch MD Signature Date/Time: 12/24/2020/4:19:52 PM    Final     Cardiac Studies   Echo: 12/24/20  IMPRESSIONS    1. Left ventricular ejection fraction, by estimation, is <20%. The left  ventricle has severely decreased function. The left ventricle demonstrates  global hypokinesis. The left ventricular internal cavity size was  moderately dilated. Left ventricular  diastolic parameters are consistent with Grade III diastolic dysfunction  (restrictive).  2. Right ventricular systolic function is moderately reduced. The right  ventricular size is normal. There is normal pulmonary artery systolic  pressure.  3. Left atrial size was severely dilated.  4. The mitral valve is normal in structure. Mild mitral valve  regurgitation. No evidence of mitral stenosis.  5. The aortic valve is tricuspid. Aortic valve regurgitation is not  visualized. No aortic stenosis is present.  6. Aortic dilatation noted. There is mild dilatation of the ascending  aorta, measuring 38 mm.  7. The inferior vena cava is dilated in size with >50% respiratory  variability, suggesting right atrial pressure of 8  mmHg.   Patient Profile     55 y.o. 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 Found to have an EF of <20%.   Assessment & Plan    1. Acute on chronic combined HF: EF this admission severely reduced at <20%. Etiology unclear, could be 2/2 uncontrolled HTN. hsTn 16>>19. Though does have RFs with HTN, DM and HLD. Has been diuresing with IV lasix, currently Net -4.9L. Will review further with MD.  -- continue on coreg 25mg  BID, would like to transition to [facility]. Will stop lisinopril-->transition to losartan in the interval. Add spiro if renal function remains stable.  -- consider adding SGLT2 as well  -- will message CHF pharmD regarding coverage options for medications  2. SI: notes from psych reviewed. Plans for admission to inpatient psych hospital for treatment  3. HTN: blood pressures remain elevated. Will continue coreg 25mg  BID, plan to transition to ARB-->Entresto.  -- increase hydralazine to 75mg  TID  4. HLD: on high dose statin and Zetia  -- check lipids   5. DM: home medications were glipizide.  -- check Hgb A1c -- as above consider SGLT2  6. Hypokalemia: supplement  For questions or updates, please contact [place] Please consult [email or web address] for contact info under        Signed, Reino Bellis, NP  12/26/2020, 8:55 AM    I have examined the patient and reviewed assessment and plan and discussed with patient.  Agree with above as stated.    Able to lie  flat and breath well. I personally reviewed the echo.  Global LV dysfunction. Negative troponin-flat trend.  No typical chest pain.  Cath several years ago was negative for CAD. Would plan for medical therapy for nonischemic cardiomyopathy as noted above.    Aggressive secondary prevention for RF modification of HTN, hyperlipidemia, and DM2.  Medication compliance will be key.  He has had issues  with HTN and peych issues as well.    Larae Grooms

## 2020-12-27 ENCOUNTER — Encounter (HOSPITAL_COMMUNITY): Payer: Self-pay | Admitting: Internal Medicine

## 2020-12-27 DIAGNOSIS — E1159 Type 2 diabetes mellitus with other circulatory complications: Secondary | ICD-10-CM

## 2020-12-27 LAB — LIPID PANEL
Cholesterol: 97 mg/dL (ref 0–200)
HDL: 35 mg/dL — ABNORMAL LOW (ref >40)
LDL Cholesterol: 49 mg/dL (ref 0–99)
Total CHOL/HDL Ratio: 2.8 RATIO
Triglycerides: 67 mg/dL (ref <150)
VLDL: 13 mg/dL (ref 0–40)

## 2020-12-27 LAB — HEMOGLOBIN A1C
Hgb A1c MFr Bld: 5.5 % (ref 4.8–5.6)
Mean Plasma Glucose: 111.15 mg/dL

## 2020-12-27 LAB — BASIC METABOLIC PANEL
Anion gap: 9 (ref 5–15)
BUN: 18 mg/dL (ref 6–20)
CO2: 24 mmol/L (ref 22–32)
Calcium: 8.6 mg/dL — ABNORMAL LOW (ref 8.9–10.3)
Chloride: 105 mmol/L (ref 98–111)
Creatinine, Ser: 1.45 mg/dL — ABNORMAL HIGH (ref 0.61–1.24)
GFR, Estimated: 58 mL/min — ABNORMAL LOW (ref >60)
Glucose, Bld: 127 mg/dL — ABNORMAL HIGH (ref 70–99)
Potassium: 3.9 mmol/L (ref 3.5–5.1)
Sodium: 138 mmol/L (ref 135–145)

## 2020-12-27 MED ORDER — POTASSIUM CHLORIDE CRYS ER 20 MEQ PO TBCR: 40.0000 meq | EXTENDED_RELEASE_TABLET | Freq: Every day | ORAL | Status: DC

## 2020-12-27 MED ORDER — INFLUENZA VAC SPLIT QUAD 0.5 ML IM SUSY
0.5000 mL | PREFILLED_SYRINGE | INTRAMUSCULAR | Status: AC
  Administered 2020-12-28: 0.5 mL via INTRAMUSCULAR
  Filled 2020-12-27: qty 0.5

## 2020-12-27 MED ORDER — DAPAGLIFLOZIN PROPANEDIOL 5 MG PO TABS
10.0000 mg | ORAL_TABLET | Freq: Every day | ORAL | Status: DC
  Administered 2020-12-27 – 2021-01-03 (×8): 10 mg via ORAL
  Filled 2020-12-27 (×5): qty 2
  Filled 2020-12-27: qty 1
  Filled 2020-12-27 (×2): qty 2
  Filled 2020-12-27: qty 1

## 2020-12-27 MED ORDER — SPIRONOLACTONE 25 MG PO TABS
25.0000 mg | ORAL_TABLET | Freq: Every day | ORAL | Status: DC
  Administered 2020-12-27 – 2021-01-03 (×8): 25 mg via ORAL
  Filled 2020-12-27 (×8): qty 1

## 2020-12-27 NOTE — Progress Notes (Signed)
Heart Failure Stewardship Pharmacist Progress Note   PCP: Charlott Rakes, MD PCP-Cardiologist: No primary care provider on file.    HPI:  55 yo M with PMH of HTN, HLD, DM, CVA, and CHF. He presented to the ED for shortness of breath and noted he ran out of his medications. An ECHO was done on 12/24/20 and LVEF was <20% (was 40-45% in March 2021).  Current HF Medications: Furosemide 40 mg daily Carvedilol 25 mg BID Losartan 25 mg daily Spironolactone 25 mg daily Farxiga 10 mg daily Hydralazine 75 mg TID  Prior to admission HF Medications:    Not taking these medications Furosemide 40 mg daily Carvedilol 12.5 mg BID Lisinopril 5 mg daily Hydralazine 50 mg TID  Pertinent Lab Values: . Serum creatinine 1.45, BUN 18, Potassium 3.9, Sodium 138, BNP 647, Magnesium 1.8   Vital Signs: . Weight: 194 lbs (admission weight: 195 lbs) . Blood pressure: 120-150/80-100s  . Heart rate: 60s   Medication Assistance / Insurance Benefits Check: Does the patient have prescription insurance?  No  Does the patient qualify for medication assistance through manufacturers or grants?   Yes . Eligible grants and/or patient assistance programs: [employer] . Medication assistance applications in progress: none  . Medication assistance applications approved: none Approved medication assistance renewals will be completed by: [place]:  Prior to admission outpatient pharmacy: [facility] Is the patient willing to use [facility] at discharge? Yes Is the patient willing to transition their outpatient pharmacy to utilize a [facility] outpatient pharmacy?   Pending    Assessment: 1. Acute on chronic systolic CHF (EF <26%), due to NICM, medication noncompliance. NYHA class II symptoms. - Continue furosemide 40 mg daily - Continue carvedilol 25 mg BID - Continue losartan 25 mg daily - hope to transition to [facility] prior to discharge - Agree with  starting spironolactone 25 mg daily - Agree with starting Farxiga 10 mg daily - Continue hydralazine 75 mg TID - Consider adding Imdur for hydral/nitrate benefit as a combination (can always consolidate to BiDil for less pill burden)   Plan: 1) Medication changes recommended at this time: - Agree with changes as noted above  2) Patient assistance application(s): - Patient can qualify for both Entresto and Farxiga/Jardiance patient assistance due to not having insurance. These programs would provide the medications for free to him so cost will not be an issue. Will begin to prepare the paperwork for him.  3)  Education  - To be completed prior to discharge  Kerby Nora, PharmD, BCPS Heart Failure Stewardship Pharmacist Phone [phone]

## 2020-12-27 NOTE — Progress Notes (Addendum)
Progress Note  Patient Name: Matthew [name]. Date of Encounter: 12/27/2020  [facility] Cardiologist: No primary care provider on file.   Subjective   No chest pain this morning. Breathing is better. Sitter the bedside.   Inpatient Medications    Scheduled Meds: . atorvastatin  80 mg Oral Daily  . carvedilol  25 mg Oral BID WC  . enoxaparin (LOVENOX) injection  40 mg Subcutaneous Q24H  . ezetimibe  10 mg Oral Daily  . FLUoxetine  30 mg Oral Daily  . furosemide  40 mg Oral Daily  . hydrALAZINE  75 mg Oral TID  . losartan  25 mg Oral Daily  . potassium chloride  40 mEq Oral BID   Continuous Infusions:  PRN Meds: acetaminophen **OR** acetaminophen, clonazePAM, polyethylene glycol   Vital Signs    Vitals:   12/26/20 2300 12/27/20 0326 12/27/20 0614 12/27/20 0739  BP: 137/90 (!) 150/98 (!) 155/104 (!) 145/81  Pulse: 72 70 70 71  Resp: 18 16 18 18   Temp: 98.2 F (36.8 C) 97.7 F (36.5 C) 98 F (36.7 C) 98.1 F (36.7 C)  TempSrc: Oral Oral Oral Oral  SpO2: 98% 98%  98%  Weight:  88 kg    Height:        Intake/Output Summary (Last 24 hours) at 12/27/2020 0928 Last data filed at 12/27/2020 0800 Gross per 24 hour  Intake 1340 ml  Output 1250 ml  Net 90 ml   Last 3 Weights 12/27/2020 12/26/2020 12/24/2020  Weight (lbs) 194 lb 1 oz 194 lb 3.2 oz 195 lb 4.8 oz  Weight (kg) 88.025 kg 88.089 kg 88.587 kg      Telemetry    SR - Personally Reviewed  ECG    No new tracing.  Physical Exam   GEN: No acute distress.   Neck: No JVD Cardiac: RRR, no murmurs, rubs, or gallops.  Respiratory: Clear to auscultation bilaterally. GI: Soft, nontender, non-distended  MS: No edema; No deformity. Neuro:  Nonfocal  Psych: Normal affect   Labs    High Sensitivity Troponin:   Recent Labs  Lab 12/24/20 0358 12/24/20 0608  TROPONINIHS 16 19*      Chemistry Recent Labs  Lab 12/24/20 0358 12/24/20 0414 12/25/20 0637 12/26/20 0125 12/27/20 0143  NA 141   < >  138 138 138  K 3.6   < > 3.8 3.3* 3.9  CL 107  --  105 103 105  CO2 23  --  23 25 24   GLUCOSE 126*  --  115* 112* 127*  BUN 8  --  9 15 18   CREATININE 1.41*  --  1.34* 1.62* 1.45*  CALCIUM 9.0  --  9.2 8.8* 8.6*  PROT 6.6  --   --   --   --   ALBUMIN 3.6  --   --   --   --   AST 22  --   --   --   --   ALT 37  --   --   --   --   ALKPHOS 64  --   --   --   --   BILITOT 1.0  --   --   --   --   GFRNONAA 60*  --  >60 50* 58*  ANIONGAP 11  --  10 10 9    < > = values in this interval not displayed.     Hematology Recent Labs  Lab 12/24/20 0358 12/24/20 0414 12/24/20  7035 12/26/20 0125  WBC 8.5  --   --  5.4  RBC 5.26  --   --  5.03  HGB 16.2 16.3 15.3 15.6  HCT 46.6 48.0 45.0 44.0  MCV 88.6  --   --  87.5  MCH 30.8  --   --  31.0  MCHC 34.8  --   --  35.5  RDW 12.9  --   --  12.8  PLT 210  --   --  197    BNP Recent Labs  Lab 12/24/20 0358  BNP 647.2*     DDimer No results for input(s): DDIMER in the last 168 hours.   Radiology    No results found.  Cardiac Studies   Echo: 12/24/20  IMPRESSIONS    1. Left ventricular ejection fraction, by estimation, is <20%. The left  ventricle has severely decreased function. The left ventricle demonstrates  global hypokinesis. The left ventricular internal cavity size was  moderately dilated. Left ventricular  diastolic parameters are consistent with Grade III diastolic dysfunction  (restrictive).  2. Right ventricular systolic function is moderately reduced. The right  ventricular size is normal. There is normal pulmonary artery systolic  pressure.  3. Left atrial size was severely dilated.  4. The mitral valve is normal in structure. Mild mitral valve  regurgitation. No evidence of mitral stenosis.  5. The aortic valve is tricuspid. Aortic valve regurgitation is not  visualized. No aortic stenosis is present.  6. Aortic dilatation noted. There is mild dilatation of the ascending  aorta, measuring 38 mm.   7. The inferior vena cava is dilated in size with >50% respiratory  variability, suggesting right atrial pressure of 8 mmHg.   Patient Profile     55 y.o. 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 Found to have an EF of <20%.   Assessment & Plan    1. Acute on chronic combined HF: EF this admission severely reduced at <20%. Etiology unclear, though likely nonischemic given his uncontrolled HTN. hsTn 16>>19. Though does have RFs with HTN, DM and HLD. Weight is down 194lbs, net - 4.7L -- continue on coreg 25mg  BID, would like to transition to [facility] tomorrow (last dose of lisinopril yesterday) -- continue lasix 40mg  daily -- add spiro and farxiga   2. SI: notes from psych reviewed. Plans for admission to inpatient psych hospital for treatment  3. HTN: blood pressures remain elevated. Will continue coreg 25mg  BID, plan to transition to ARB-->Entresto tomorrow.  -- increased hydralazine to 75mg  TID  4. HLD: on high dose statin and Zetia  -- LDL 49  5. DM: home medications were glipizide.  -- Hgb A1c 5.5 -- adding farxiga   6. Hypokalemia: stable at 3.9. Will reduce supplement to 70meq daily -- BMET in am  For questions or updates, please contact [place] Please consult [email or web address] for contact info under        Signed, Reino Bellis, NP  12/27/2020, 9:28 AM    I have examined the patient and reviewed assessment and plan and discussed with patient.  Agree with above as stated.    Optimizing CHF and BP meds.  Appears euvolemic.  [name] given CHF as well.  Appreciate pharmacy assistance.  Dispo to behavioral health.  Larae Grooms

## 2020-12-27 NOTE — Progress Notes (Signed)
HD#3 Subjective:   Overnight Events: No acute events overnight  Patient evaluated at bedside. States he is feeling better today. Does endorse some left sided chest pain that is reproducible. He denies shortness of breath. Informed patient of plan to increase his hydralazine, coreg, and start him on losartan. He was instructed to stop taking his lisinopril.   He was reminded of the plan that after medically stable, he will be transferred to an inpatient psych facility for further management of his psychiatric diagnosis. He prefers the center across from [employer]. No other questions or concerns at the time of my examination.   Objective:   Vital signs in last 24 hours: Vitals:   12/26/20 2106 12/26/20 2300 12/27/20 0326 12/27/20 0614  BP: (!) 133/95 137/90 (!) 150/98 (!) 155/104  Pulse: 60 72 70 70  Resp: 15 18 16 18   Temp: 98.5 F (36.9 C) 98.2 F (36.8 C) 97.7 F (36.5 C) 98 F (36.7 C)  TempSrc: Oral Oral Oral Oral  SpO2: 98% 98% 98%   Weight:   88 kg   Height:       Supplemental O2: room air SpO2: 95 % O2 Flow Rate (L/min): 2 L/min FiO2 (%): 100 %  Physical Exam Physical Exam Vitals and nursing note reviewed.  Constitutional:      General: He is not in acute distress.    Appearance: He is not ill-appearing, toxic-appearing or diaphoretic.  HENT:     Head: Normocephalic and atraumatic.  Cardiovascular:     Rate and Rhythm: Normal rate and regular rhythm.     Pulses: Normal pulses.     Heart sounds: Normal heart sounds. No murmur heard. No gallop.   Pulmonary:     Effort: Pulmonary effort is normal. No respiratory distress.     Breath sounds: Normal breath sounds. No stridor. No wheezing or rales.  Musculoskeletal:     Right lower leg: No edema.     Left lower leg: No edema.  Skin:    General: Skin is warm and dry.  Neurological:     General: No focal deficit present.     Mental Status: He is alert and oriented to person, place, and time. Mental status  is at baseline.  Psychiatric:        Mood and Affect: Affect is blunt.     Comments: Inappropriate laughter     Filed Weights   12/24/20 2100 12/26/20 0100 12/27/20 0326  Weight: 88.6 kg 88.1 kg 88 kg    Intake/Output Summary (Last 24 hours) at 12/27/2020 0734 Last data filed at 12/27/2020 0723 Gross per 24 hour  Intake 1140 ml  Output 1375 ml  Net -235 ml   Net IO Since Admission: -5,212.11 mL [12/27/20 0734]  Pertinent Labs: CBC Latest Ref Rng & Units 12/26/2020 12/24/2020 12/24/2020  WBC 4.0 - 10.5 K/uL 5.4 - -  Hemoglobin 13.0 - 17.0 g/dL 15.6 15.3 16.3  Hematocrit 39.0 - 52.0 % 44.0 45.0 48.0  Platelets 150 - 400 K/uL 197 - -    CMP Latest Ref Rng & Units 12/27/2020 12/26/2020 12/25/2020  Glucose 70 - 99 mg/dL 127(H) 112(H) 115(H)  BUN 6 - 20 mg/dL 18 15 9   Creatinine 0.61 - 1.24 mg/dL 1.45(H) 1.62(H) 1.34(H)  Sodium 135 - 145 mmol/L 138 138 138  Potassium 3.5 - 5.1 mmol/L 3.9 3.3(L) 3.8  Chloride 98 - 111 mmol/L 105 103 105  CO2 22 - 32 mmol/L 24 25 23   Calcium 8.9 -  10.3 mg/dL 8.6(L) 8.8(L) 9.2  Total Protein 6.5 - 8.1 g/dL - - -  Total Bilirubin 0.3 - 1.2 mg/dL - - -  Alkaline Phos 38 - 126 U/L - - -  AST 15 - 41 U/L - - -  ALT 0 - 44 U/L - - -    Imaging: No results found.   12/24/20 Echocardiogram IMPRESSIONS  1. Left ventricular ejection fraction, by estimation, is <20%. The left  ventricle has severely decreased function. The left ventricle demonstrates  global hypokinesis. The left ventricular internal cavity size was  moderately dilated. Left ventricular  diastolic parameters are consistent with Grade III diastolic dysfunction  (restrictive).  2. Right ventricular systolic function is moderately reduced. The right  ventricular size is normal. There is normal pulmonary artery systolic  pressure.  3. Left atrial size was severely dilated.  4. The mitral valve is normal in structure. Mild mitral valve  regurgitation. No evidence of mitral stenosis.   5. The aortic valve is tricuspid. Aortic valve regurgitation is not  visualized. No aortic stenosis is present.  6. Aortic dilatation noted. There is mild dilatation of the ascending  aorta, measuring 38 mm.  7. The inferior vena cava is dilated in size with >50% respiratory  variability, suggesting right atrial pressure of 8 mmHg.   Assessment/Plan:   Active Problems:   Hypertensive urgency   Acute exacerbation of congestive heart failure ([place])  Patient Summary:  Matthew [name]. 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  This is hospital day 3.  Acute on Chronic Combined Systolic and Diastolic Heart Failure; now with worsening diastolic and biventricular heart failure Patient appears euvolemic on examination, weight continues to decrease. In setting of cardiac workup thus far, appears to be of non-ischemic etiology. Patient not requiring supplemental oxygen. He is medically stable and no longer needs further medical management/work-up. Reported chest pain today is reproducible in nature, suspect MSK etiology.  Medications changes as per cardiology note yesterday, plan to add spironolactone, farxiga, and start entresto. Patient's renal function has remained stable throughout his hospitalization. CHF Pharmacist following, will assist patient in filling out financial forms entresto/farxiga.   -continue hydralazine 75 mg BID -continue coreg 25 mg BID, transition to entresto tomorrow per cardiology -start spironolactone and farxiga.  -continue lasix 40 mg IV - I/O -235 yesterday - Mg 1.8 yesterday, given 2 g mag yesterday - K 3.9 - Strict I&O, daily weights - Continue antihypertensives  as per below  Severe Symptomatic Hypertension 2/2 Medication Non-Adherence BP continues to be elevated, 140/99 this am. Cardiology recs as per above - medications changes as per above  Depression Suicidal ideation Evaluated by psych. Recommend inpatient psych once medically cleared.  - Work with social work to start process of inpatient psych placement, referrals sent out today - Continue fluoxetine 30 mg daily - Suicide precautions: 1:1 sitter - Klonopin 0.25 mg qHS PRN  CKD Stage 2 sCr 1.62->1.45(baseline ~1.40-1.70), at baseline  Type II diabetes mellitus Prescribed glipizide 5 mg twice daily with meals. Reports non-adherence. Random BG 126 on arrival. Will hold home glipizide while in the hospital. - CBG monitoring - SSI   Hyperlipidemia - Continue home atorvastatin 80 mg daily - Continue home ezetimibe (Zetia) 10  mg daily   Diet: Heart Healthy VTE: Enoxaparin IVF: None Code: DNR  Dispo: Anticipated discharge to inpatient psych pending placement   Please contact the on call pager after 5 pm and on weekends at [phone].  Sanjuana Letters DO  PGY-1 Internal Medicine Teaching Service Pager: [phone] 12/27/2020

## 2020-12-27 NOTE — Progress Notes (Signed)
Called report to Rod Holler, Therapist, sports. Pt going to rm [id number].   Lavenia Atlas, RN

## 2020-12-27 NOTE — Progress Notes (Signed)
Heart Failure Nurse Navigator Progress Note  Sitter at bedside. Pt appreciates f/u visit; states he is frustrated with the room change, but has now learned why he needed to change rooms and has calmed down. Pt states plan for DC is to inpt behavioral health.  CSW: working on disability, food stamps, phone assistance.  Pt currently has no insurance or income.  Pt agrees to stop illicit drug use.  Pt requesting to shower--bedside RN will follow up with attending for clearance, ok with cardiology (Dr. Clayton Bibles).  Pt requesting to restart Timolol eye drops (home med)--paged IMTS regarding this issue.

## 2020-12-27 NOTE — Discharge Summary (Addendum)
Name: Matthew [name]. MRN: [id number] DOB: 04/11/1966 55 y.o. PCP: Charlott Rakes, MD  Date of Admission: 12/24/2020  3:47 AM Date of Discharge: 01/03/21 Attending Physician: Dr. Angelia Mould  Discharge Diagnosis: Active Problems:   Hypertensive urgency   Acute exacerbation of congestive heart failure [facility])    Discharge Medications: Allergies as of 01/03/2021   No Known Allergies      Medication List     STOP taking these medications    acetaminophen 500 MG tablet Commonly known as: TYLENOL   FLUoxetine 20 MG tablet Commonly known as: PROZAC Replaced by: FLUoxetine 10 MG capsule   glipiZIDE 5 MG tablet Commonly known as: GLUCOTROL   hydrALAZINE 50 MG tablet Commonly known as: APRESOLINE   lisinopril 5 MG tablet Commonly known as: ZESTRIL       TAKE these medications    aspirin 81 MG tablet Take 1 tablet (81 mg total) by mouth daily.   atorvastatin 80 MG tablet Commonly known as: LIPITOR Take 1 tablet (80 mg total) by mouth daily.   carvedilol 25 MG tablet Commonly known as: COREG Take 1 tablet (25 mg total) by mouth 2 (two) times daily with a meal. What changed:  medication strength how much to take additional instructions   clonazePAM 0.5 MG tablet Commonly known as: KLONOPIN Take 1 tablet (0.5 mg total) by mouth at bedtime as needed (sleep, anxiety).   dapagliflozin propanediol 10 MG Tabs tablet Commonly known as: FARXIGA Take 1 tablet (10 mg total) by mouth daily. Start taking on: January 04, 2021   ezetimibe 10 MG tablet Commonly known as: ZETIA Take 1 tablet (10 mg total) by mouth daily.   FLUoxetine 10 MG capsule Commonly known as: PROZAC Take 3 capsules (30 mg total) by mouth daily. Start taking on: January 04, 2021 Replaces: FLUoxetine 20 MG tablet   furosemide 40 MG tablet Commonly known as: LASIX TAKE 1 TABLET (40 MG TOTAL) BY MOUTH DAILY.   glucose blood test strip Commonly known as: ONE TOUCH ULTRA TEST Use as  instructed   ibuprofen 400 MG tablet Commonly known as: ADVIL Take 1 tablet (400 mg total) by mouth every 6 (six) hours as needed for headache or moderate pain.   isosorbide-hydrALAZINE 20-37.5 MG tablet Commonly known as: BIDIL Take 2 tablets by mouth 3 (three) times daily.   sacubitril-valsartan 49-51 MG Commonly known as: ENTRESTO Take 1 tablet by mouth 2 (two) times daily.   spironolactone 25 MG tablet Commonly known as: ALDACTONE Take 1 tablet (25 mg total) by mouth daily. Start taking on: January 04, 2021   timolol 0.5 % ophthalmic solution Commonly known as: TIMOPTIC Place 1 drop into both eyes daily after breakfast.        Disposition and follow-up:   Matthew [name]. was discharged from [facility] in Good condition.  At the hospital follow up visit please address:  1.  Follow-up:  A. CHF Exacerbation    B. Suicidal ideation with plan  2.  Labs / imaging needed at time of follow-up: None  3.  Pending labs/ test needing follow-up: None  4.  Medication Changes  Started: BiDil, Mearl Latin, Farxiga  Stopped: Hydral  Changed Dose: Increased Coreg, increased prozac  Abx - None  Follow-up Appointments: Discharged to inpatient psych facility. Follow up with cardiologist after inpatient psych evaluation  Follow-up Information     Charlott Rakes, MD Follow up.   Specialty: Family Medicine Contact information: [address]  [place] [address] [place] Hospital Course by problem list:  Acute Biventricular Heart Failure - Patient presented with shortness of breath requiring oxygen supplementation. He has a history of combined systolic/diastolic heart failure and was not adherent with his medication regimen. Found to have BNP of 600's, CXR consistent with pulmonary edema. Patient had symptomatic improvement with IV lasix. He was diuresed throughout his visit with continued improvement and no longer  required oxygen supplementation. He was evaluated by cardiology who optimized his medications and did not believe further evaluation was warranted. Please see medication changes above.   Suicidal Ideation - Patient endorsed active suicidal ideation with a plan upon initial evaluation, meeting criteria for depression. His wife suddenly passed 10 months ago. Patient does have access to firearms in his house, thus psychiatry was consulted. They believe patient required inpatient psychiatric hospitalization and increased his prozac.   Discharge Vitals:   BP (!) 152/83 (BP Location: Left Arm)   Pulse 81   Temp 99.2 F (37.3 C) (Oral)   Resp 18   Ht 5\' 9"  (1.753 m)   Wt 88 kg   SpO2 100%   BMI 28.65 kg/m   Pertinent Labs, Studies, and Procedures:  CBC Latest Ref Rng & Units 12/26/2020 12/24/2020 12/24/2020  WBC 4.0 - 10.5 K/uL 5.4 - -  Hemoglobin 13.0 - 17.0 g/dL 15.6 15.3 16.3  Hematocrit 39.0 - 52.0 % 44.0 45.0 48.0  Platelets 150 - 400 K/uL 197 - -    CMP Latest Ref Rng & Units 01/03/2021 12/30/2020 12/28/2020  Glucose 70 - 99 mg/dL 95 100(H) 93  BUN 6 - 20 mg/dL 23(H) 15 13  Creatinine 0.61 - 1.24 mg/dL 1.44(H) 1.36(H) 1.35(H)  Sodium 135 - 145 mmol/L 137 138 139  Potassium 3.5 - 5.1 mmol/L 4.1 4.1 3.9  Chloride 98 - 111 mmol/L 106 107 104  CO2 22 - 32 mmol/L 21(L) 21(L) 24  Calcium 8.9 - 10.3 mg/dL 8.6(L) 8.9 9.0  Total Protein 6.5 - 8.1 g/dL - - -  Total Bilirubin 0.3 - 1.2 mg/dL - - -  Alkaline Phos 38 - 126 U/L - - -  AST 15 - 41 U/L - - -  ALT 0 - 44 U/L - - -    CT HEAD WO CONTRAST  Result Date: 12/24/2020 CLINICAL DATA:  Acute neuro deficit. EXAM: CT HEAD WITHOUT CONTRAST TECHNIQUE: Contiguous axial images were obtained from the base of the skull through the vertex without intravenous contrast. COMPARISON:  Head CT dated 01/31/2017. FINDINGS: Brain: Ventricles are within normal limits in size. 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 There is no mass, hemorrhage, edema or other evidence of acute parenchymal abnormality. No extra-axial hemorrhage. Vascular: No hyperdense vessel or unexpected calcification. Skull: Normal. Negative for fracture or focal lesion. Sinuses/Orbits: No acute finding. Other: None. IMPRESSION: 1. No acute findings. No intracranial mass, hemorrhage or edema. 2. 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 Electronically Signed   By: Franki Cabot M.D.   On: 12/24/2020 10:16   DG Chest Port 1 View  Result Date: 12/24/2020 CLINICAL DATA:  Shortness of breath, chest congestion and dry cough EXAM: PORTABLE CHEST 1 VIEW COMPARISON:  Radiograph 10/03/2020, CT 02/04/2017 FINDINGS: Patchy ill-defined opacities are present throughout both lungs. Some mild vascular congestion is noted as well with cephalization indistinct vascularity and fissural thickening. Prominent cardiac silhouette though possibly  accentuated by low volumes and portable technique. No pneumothorax. No effusion. No acute osseous or soft tissue abnormality. Telemetry leads overlie the chest. IMPRESSION: 1. Ill-defined and patchy opacities throughout both lungs with some mild vascular congestion could reflect edema and/or infection in the appropriate clinical context. Electronically Signed   By: Lovena Le M.D.   On: 12/24/2020 04:25   ECHOCARDIOGRAM COMPLETE  Result Date: 12/24/2020    ECHOCARDIOGRAM REPORT   Patient Name:   Matthew [name]. Date of Exam: 12/24/2020 Medical Rec #:  [id number]           Height:       69.0 in Accession #:    [id number]          Weight:       224.9 lb Date of Birth:  11/02/1966           BSA:          2.172 m Patient Age:    28 years            BP:           170/111 mmHg Patient Gender: M                   HR:           65 bpm. Exam Location:  Inpatient Procedure: 2D Echo, Cardiac Doppler and Color Doppler Indications:    R07.9* Chest pain, unspecified  History:         Patient has prior history of Echocardiogram examinations, most                 recent 02/15/2020. Cardiomyopathy and CHF; Risk Factors:Current                 Smoker, Diabetes, Hypertension and Dyslipidemia.  Sonographer:    Jonelle Sidle Dance Referring Phys: [id number] ANKIT NANAVATI IMPRESSIONS  1. Left ventricular ejection fraction, by estimation, is <20%. The left ventricle has severely decreased function. The left ventricle demonstrates global hypokinesis. The left ventricular internal cavity size was moderately dilated. Left ventricular diastolic parameters are consistent with Grade III diastolic dysfunction (restrictive).  2. Right ventricular systolic function is moderately reduced. The right ventricular size is normal. There is normal pulmonary artery systolic pressure.  3. Left atrial size was severely dilated.  4. The mitral valve is normal in structure. Mild mitral valve regurgitation. No evidence of mitral stenosis.  5. The aortic valve is tricuspid. Aortic valve regurgitation is not visualized. No aortic stenosis is present.  6. Aortic dilatation noted. There is mild dilatation of the ascending aorta, measuring 38 mm.  7. The inferior vena cava is dilated in size with >50% respiratory variability, suggesting right atrial pressure of 8 mmHg. FINDINGS  Left Ventricle: Left ventricular ejection fraction, by estimation, is <20%. The left ventricle has severely decreased function. The left ventricle demonstrates global hypokinesis. The left ventricular internal cavity size was moderately dilated. There is no left ventricular hypertrophy. Left ventricular diastolic parameters are consistent with Grade III diastolic dysfunction (restrictive). Right Ventricle: The right ventricular size is normal. No increase in right ventricular wall thickness. Right ventricular systolic function is moderately reduced. There is normal pulmonary artery systolic pressure. The tricuspid regurgitant velocity is 2.04 m/s, and with an assumed  right atrial pressure of 8 mmHg, the estimated right ventricular systolic pressure is 123456 mmHg. Left Atrium: Left atrial size was severely dilated. Right Atrium: Right atrial size was normal in size. Pericardium: Trivial pericardial effusion is  present. Mitral Valve: The mitral valve is normal in structure. Mild mitral valve regurgitation. No evidence of mitral valve stenosis. Tricuspid Valve: The tricuspid valve is normal in structure. Tricuspid valve regurgitation is trivial. No evidence of tricuspid stenosis. Aortic Valve: The aortic valve is tricuspid. Aortic valve regurgitation is not visualized. No aortic stenosis is present. Pulmonic Valve: The pulmonic valve was normal in structure. Pulmonic valve regurgitation is trivial. No evidence of pulmonic stenosis. Aorta: Aortic dilatation noted. There is mild dilatation of the ascending aorta, measuring 38 mm. Venous: The inferior vena cava is dilated in size with greater than 50% respiratory variability, suggesting right atrial pressure of 8 mmHg. IAS/Shunts: No atrial level shunt detected by color flow Doppler.  LEFT VENTRICLE PLAX 2D LVIDd:         6.50 cm LVIDs:         6.10 cm LV PW:         1.70 cm LV IVS:        1.50 cm LVOT diam:     2.40 cm LV SV:         66 LV SV Index:   31 LVOT Area:     4.52 cm  RIGHT VENTRICLE            IVC RV Basal diam:  2.80 cm    IVC diam: 2.50 cm RV S prime:     6.68 cm/s TAPSE (M-mode): 1.8 cm LEFT ATRIUM              Index       RIGHT ATRIUM           Index LA diam:        5.90 cm  2.72 cm/m  RA Area:     15.20 cm LA Vol (A2C):   129.0 ml 59.41 ml/m RA Volume:   38.90 ml  17.91 ml/m LA Vol (A4C):   149.0 ml 68.62 ml/m LA Biplane Vol: 140.0 ml 64.47 ml/m  AORTIC VALVE LVOT Vmax:   86.30 cm/s LVOT Vmean:  54.050 cm/s LVOT VTI:    0.147 m  AORTA Ao Root diam: 3.40 cm Ao Asc diam:  3.80 cm MITRAL VALVE               TRICUSPID VALVE MV Area (PHT): 2.76 cm    TR Peak grad:   16.6 mmHg MV Decel Time: 275 msec    TR Vmax:         204.00 cm/s MV E velocity: 71.15 cm/s                            SHUNTS                            Systemic VTI:  0.15 m                            Systemic Diam: 2.40 cm Skeet Latch MD Electronically signed by Skeet Latch MD Signature Date/Time: 12/24/2020/4:19:52 PM    Final      Discharge Instructions: Discharge Instructions     Diet - low sodium heart healthy   Complete by: As directed    Increase activity slowly   Complete by: As directed        Signed: Riesa Pope, MD 01/03/2021, 4:00 PM   Pager: [phone]

## 2020-12-27 NOTE — TOC Initial Note (Signed)
Transition of Care (TOC) - Initial/Assessment Note    Patient Details  Name: Matthew [name]. MRN: [id number] Date of Birth: 1966/12/06  Transition [facility]) CM/SW Contact:    Vinie Sill, [place] Phone Number: 12/27/2020, 12:04 PM  Clinical Narrative:                  CSW visit with patient at bedside. CSW introduced self and explained role. CSW discussed with patient psych recommendation inpatient psych and explained the process. Patient acknowledged the need for mental health treatment. Patient states he has not received any counseling to cope with the loss of his wife.Patient expressed he really wants [facility] in [place]. CSW explained referral will be sent to [facility] too. Patient signed the voluntary consent for treatrment (place in chart). Patient expressed feelings of being over whelmed and concerned about how he as going to pay his bills. He reports he has not worked since 10/2020. He states he has not worked because " I have been sick". He requested information for food stamps and disability. CSW advised will give him the information so he can apply over the phone for assistance.   CSW will continue to follow and assist with discharge planning.   Thurmond Butts, MSW, LCSW Clinical Social Worker   Expected Discharge Plan: Skilled Nursing Facility Barriers to Discharge:  (pscyh bed availability)   Patient Goals and CMS Choice        Expected Discharge Plan and Services Expected Discharge Plan: [place] In-house Referral: Clinical Social Work                                            Prior Living Arrangements/Services     Patient language and need for interpreter reviewed:: No        Need for Family Participation in Patient Care: Yes (Comment) Care giver support system in place?: No (comment)   Criminal Activity/Legal Involvement Pertinent to Current Situation/Hospitalization: No - Comment as needed  Activities of Daily  Living      Permission Sought/Granted Permission sought to share information with : Family Supports Permission granted to share information with : Yes, Verbal Permission Granted  Share Information with NAME: Sheppard Coil        Permission granted to share info w Contact Information: [phone]  Emotional Assessment Appearance:: Appears older than stated age Attitude/Demeanor/Rapport: Grieving,Guarded,Engaged Affect (typically observed): Accepting,Appropriate,Overwhelmed Orientation: : Oriented to Self,Oriented to Place,Oriented to  Time,Oriented to Situation Alcohol / Substance Use: Not Applicable Psych Involvement: Yes (comment)  Admission diagnosis:  Acute on chronic combined systolic and diastolic heart failure (HCC) [I50.43] Renal insufficiency [N28.9] Acute exacerbation of congestive heart failure ([place]) [I50.9] Hypertensive urgency [I16.0] Patient Active Problem List   Diagnosis Date Noted  . Acute exacerbation of congestive heart failure ([place]) 12/24/2020  . Acute respiratory failure with hypoxia ([place]) 02/15/2020  . Acute on chronic combined systolic and diastolic CHF (congestive heart failure) ([place]) 02/15/2020  . Hypertensive urgency 02/15/2020  . CHF (congestive heart failure) ([place]) 02/15/2020  . 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 Expressive aphasia 02/20/2017  . Mixed hyperlipidemia   . History of stroke 01/31/2017  . Erectile dysfunction 07/30/2016  . OSA (obstructive sleep apnea) 05/10/2015  . CKD (chronic kidney disease) stage 2, GFR 60-89 ml/min 03/10/2014  . Diabetes mellitus type II, controlled ([place]) 03/10/2014  . Chronic  combined systolic and diastolic congestive heart failure ([place]) 03/03/2014  . Smoker   . Morbid obesity ([place])   . Hypertension 03/02/2014   PCP:  Charlott Rakes, MD Pharmacy:   No Name, [place] [address] [place] [address] Phone: [phone] Fax:  [phone]     Social Determinants of Health (SDOH) Interventions    Readmission Risk Interventions No flowsheet data found.

## 2020-12-27 NOTE — TOC Progression Note (Signed)
Transition of Care (TOC) - Progression Note    Patient Details  Name: Matthew [name]. MRN: [id number] Date of Birth: 07-21-1966  Transition of Care [facility]) CM/SW [place], [name] Phone Number: 12/27/2020, 5:28 PM  Clinical Narrative:     CSW provided patient with [place] and [place] and [facility] assistance contact information so he can apply for benefits by phone.  Thurmond Butts, MSW, LCSW Clinical Social Worker    Expected Discharge Plan: Skilled Nursing Facility Barriers to Discharge:  (pscyh bed availability)  Expected Discharge Plan and Services Expected Discharge Plan: [place] In-house Referral: Clinical Social Work                                             Social Determinants of Health (SDOH) Interventions    Readmission Risk Interventions No flowsheet data found.

## 2020-12-28 LAB — BASIC METABOLIC PANEL
Anion gap: 11 (ref 5–15)
BUN: 13 mg/dL (ref 6–20)
CO2: 24 mmol/L (ref 22–32)
Calcium: 9 mg/dL (ref 8.9–10.3)
Chloride: 104 mmol/L (ref 98–111)
Creatinine, Ser: 1.35 mg/dL — ABNORMAL HIGH (ref 0.61–1.24)
GFR, Estimated: >60 mL/min (ref >60)
Glucose, Bld: 93 mg/dL (ref 70–99)
Potassium: 3.9 mmol/L (ref 3.5–5.1)
Sodium: 139 mmol/L (ref 135–145)

## 2020-12-28 MED ORDER — SACUBITRIL-VALSARTAN 49-51 MG PO TABS
1.0000 | ORAL_TABLET | Freq: Two times a day (BID) | ORAL | Status: DC
  Administered 2020-12-28 – 2021-01-03 (×13): 1 via ORAL
  Filled 2020-12-28 (×14): qty 1

## 2020-12-28 MED ORDER — TIMOLOL MALEATE 0.5 % OP SOLN
1.0000 [drp] | Freq: Every day | OPHTHALMIC | Status: DC
  Administered 2020-12-28 – 2021-01-03 (×7): 1 [drp] via OPHTHALMIC
  Filled 2020-12-28 (×3): qty 5

## 2020-12-28 MED ORDER — ISOSORB DINITRATE-HYDRALAZINE 20-37.5 MG PO TABS
2.0000 | ORAL_TABLET | Freq: Three times a day (TID) | ORAL | Status: DC
  Administered 2020-12-28 – 2021-01-03 (×19): 2 via ORAL
  Filled 2020-12-28 (×20): qty 2

## 2020-12-28 NOTE — Progress Notes (Signed)
Heart Failure Stewardship Pharmacist Progress Note   PCP: Charlott Rakes, MD PCP-Cardiologist: No primary care provider on file.    HPI:  55 yo M with PMH of HTN, HLD, DM, CVA, and CHF. He presented to the ED for shortness of breath and noted he ran out of his medications. An ECHO was done on 12/24/20 and LVEF was <20% (was 40-45% in March 2021).  Current HF Medications: Furosemide 40 mg daily Carvedilol 25 mg BID Entresto 49/51 mg BID Spironolactone 25 mg daily Farxiga 10 mg daily BiDil 20/37.5 mg 2 tablets TID  Prior to admission HF Medications:    Not taking these medications Furosemide 40 mg daily Carvedilol 12.5 mg BID Lisinopril 5 mg daily Hydralazine 50 mg TID  Pertinent Lab Values: . Serum creatinine 1.35, BUN 13, Potassium 3.9, Sodium 139, BNP 647, Magnesium 1.8   Vital Signs: . Weight: 191 lbs (admission weight: 195 lbs) . Blood pressure: 120-160/70-90s  . Heart rate: 60s   Medication Assistance / Insurance Benefits Check: Does the patient have prescription insurance?  No  Does the patient qualify for medication assistance through manufacturers or grants?   Yes . Eligible grants and/or patient assistance programs: Lisabeth Register, BiDil . Medication assistance applications in progress: Lisabeth Register, BiDil  . Medication assistance applications approved: none Approved medication assistance renewals will be completed by: [place]:  Prior to admission outpatient pharmacy: [facility] Is the patient willing to use [facility] at discharge? Yes Is the patient willing to transition their outpatient pharmacy to utilize a [facility] outpatient pharmacy?   Pending    Assessment: 1. Acute on chronic systolic CHF (EF <07%), due to NICM, medication noncompliance. NYHA class II symptoms. - Continue furosemide 40 mg daily - Continue carvedilol 25 mg BID - Agree with transitioning to Entresto 49/51 mg BID - Continue  spironolactone 25 mg daily - Continue Farxiga 10 mg daily - Agree with starting BiDil 20/37.5 mg 2 tabs TID    Plan: 1) Medication changes recommended at this time: - Agree with changes as noted above  2) Patient assistance application(s): - Delene Loll, Farxiga, and BiDil applications in process to bring copay down to $0 - Involving inpatient CSW to determine enrollment in Medicaid  3)  Education  - To be completed prior to discharge  Kerby Nora, PharmD, BCPS Heart Failure Cytogeneticist Phone [phone]

## 2020-12-28 NOTE — Progress Notes (Signed)
Heart Failure Nurse Navigator Progress Note  Visited patient with HF Cytogeneticist, Megan. Pt sitting in bed eating dinner, sitter at bedside. Pt able to sign med assistance forms.  Pt. Needs assistance with Medicaid application process, pt unable to perform task by self. Referred to outpatientt [facility] CSW, Raquel Sarna for assistance. Plan to follow up with financial assistance counselors for Medicaid and disability applications.  EF <20%, plan Dispo to [facility] w [place] f/u appts.  Will continue to follow.   Pricilla Holm, RN, BSN Heart Failure Nurse Navigator [facility] Heart Failure Program [phone]

## 2020-12-28 NOTE — Progress Notes (Signed)
HD#4 Subjective:   Overnight Events: No acute events overnight  Patient evaluated at bedside. Continues to endorse feeling better without shortness of breath. Matthew [name] then asked when he could be discharged home. I reminded him that we were stabilizing him medically for his heart failure and then he would be transferred to an inpatient psych facility to receive medical management of his psychiatric meds and suicidal thoughts. No other questions or concerns at the time of my examination.   Objective:   Vital signs in last 24 hours: Vitals:   12/27/20 1416 12/27/20 2308 12/28/20 0500 12/28/20 0610  BP: (!) 140/99 (!) 149/92  (!) 161/99  Pulse: 65 (!) 56  (!) 58  Resp: 18 20  20   Temp: 97.9 F (36.6 C) 98.3 F (36.8 C)  98.1 F (36.7 C)  TempSrc: Oral Oral  Oral  SpO2: 99% 100%  100%  Weight:   86.9 kg   Height:       Supplemental O2: room air SpO2: 95 % O2 Flow Rate (L/min): 2 L/min FiO2 (%): 100 %  Physical Exam Physical Exam Vitals and nursing note reviewed.  Constitutional:      General: He is not in acute distress.    Appearance: He is not ill-appearing, toxic-appearing or diaphoretic.  HENT:     Head: Normocephalic and atraumatic.  Cardiovascular:     Rate and Rhythm: Normal rate and regular rhythm.     Pulses: Normal pulses.     Heart sounds: Normal heart sounds. No murmur heard. No gallop.   Pulmonary:     Effort: Pulmonary effort is normal. No respiratory distress.     Breath sounds: Normal breath sounds. No stridor. No wheezing or rales.  Musculoskeletal:     Right lower leg: No edema.     Left lower leg: No edema.  Skin:    General: Skin is warm and dry.  Neurological:     General: No focal deficit present.     Mental Status: He is alert and oriented to person, place, and time. Mental status is at baseline.  Psychiatric:        Mood and Affect: Affect is blunt.     Comments: Inappropriate laughter     Filed Weights   12/26/20 0100 12/27/20  0326 12/28/20 0500  Weight: 88.1 kg 88 kg 86.9 kg    Intake/Output Summary (Last 24 hours) at 12/28/2020 0629 Last data filed at 12/27/2020 2040 Gross per 24 hour  Intake 1620 ml  Output --  Net 1620 ml   Net IO Since Admission: -3,832.11 mL [12/28/20 0629]  Pertinent Labs: CBC Latest Ref Rng & Units 12/26/2020 12/24/2020 12/24/2020  WBC 4.0 - 10.5 K/uL 5.4 - -  Hemoglobin 13.0 - 17.0 g/dL 15.6 15.3 16.3  Hematocrit 39.0 - 52.0 % 44.0 45.0 48.0  Platelets 150 - 400 K/uL 197 - -    CMP Latest Ref Rng & Units 12/28/2020 12/27/2020 12/26/2020  Glucose 70 - 99 mg/dL 93 127(H) 112(H)  BUN 6 - 20 mg/dL 13 18 15   Creatinine 0.61 - 1.24 mg/dL 1.35(H) 1.45(H) 1.62(H)  Sodium 135 - 145 mmol/L 139 138 138  Potassium 3.5 - 5.1 mmol/L 3.9 3.9 3.3(L)  Chloride 98 - 111 mmol/L 104 105 103  CO2 22 - 32 mmol/L 24 24 25   Calcium 8.9 - 10.3 mg/dL 9.0 8.6(L) 8.8(L)  Total Protein 6.5 - 8.1 g/dL - - -  Total Bilirubin 0.3 - 1.2 mg/dL - - -  Alkaline  Phos 38 - 126 U/L - - -  AST 15 - 41 U/L - - -  ALT 0 - 44 U/L - - -    Imaging: No results found.   12/24/20 Echocardiogram IMPRESSIONS  1. Left ventricular ejection fraction, by estimation, is <20%. The left  ventricle has severely decreased function. The left ventricle demonstrates  global hypokinesis. The left ventricular internal cavity size was  moderately dilated. Left ventricular  diastolic parameters are consistent with Grade III diastolic dysfunction  (restrictive).  2. Right ventricular systolic function is moderately reduced. The right  ventricular size is normal. There is normal pulmonary artery systolic  pressure.  3. Left atrial size was severely dilated.  4. The mitral valve is normal in structure. Mild mitral valve  regurgitation. No evidence of mitral stenosis.  5. The aortic valve is tricuspid. Aortic valve regurgitation is not  visualized. No aortic stenosis is present.  6. Aortic dilatation noted. There is mild  dilatation of the ascending  aorta, measuring 38 mm.  7. The inferior vena cava is dilated in size with >50% respiratory  variability, suggesting right atrial pressure of 8 mmHg.   Assessment/Plan:   Active Problems:   Hypertensive urgency   Acute exacerbation of congestive heart failure ([place])  Patient Summary:  Matthew Gover. 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  This is hospital day 4.  Acute on Chronic Combined Systolic and Diastolic Heart Failure; now with worsening diastolic and biventricular heart failure Patient appears euvolemic on examination, weight continues to decrease. In setting of cardiac workup thus far, appears to be of non-ischemic etiology. Patient not requiring supplemental oxygen. He is medically stable and no longer needs further medical management/work-up.  -start BiDil per cardiology -transitioning from coreg to entresto today -continue spironolactone and farxiga.  -continue lasix 40 mg IV -I/O -3.8L cumulative net  -K 3.9 -strict I&O, daily weights  Severe Symptomatic Hypertension 2/2 Medication Non-Adherence BP continues to be elevated, 161/99 this am - medications changes as per above -continue spironolactone   Depression Suicidal ideation Evaluated by psych. Recommend inpatient psych once medically cleared.  - Work with social work to start process of inpatient psych placement, referrals sent out yesterday - Continue fluoxetine 30 mg daily - Suicide precautions: 1:1 sitter - Klonopin 0.25 mg qHS PRN  CKD Stage 2 sCr 1.45->1.35(baseline ~1.40-1.70), at baseline  Type II diabetes mellitus Prescribed glipizide 5 mg twice daily with meals.  Reports non-adherence. Random BG 126 on arrival. Will hold home glipizide while in the hospital. - CBG monitoring - SSI   Hyperlipidemia - Continue home atorvastatin 80 mg daily - Continue home ezetimibe (Zetia) 10 mg daily   Diet: Heart Healthy VTE: Enoxaparin IVF: None Code: DNR  Dispo: Anticipated discharge to inpatient psych pending placement   Please contact the on call pager after 5 pm and on weekends at [phone].  Sanjuana Letters DO  PGY-1 Internal Medicine Teaching Service Pager: [phone] 12/28/2020

## 2020-12-28 NOTE — Progress Notes (Addendum)
Progress Note  Patient Name: Matthew [name]. Date of Encounter: 12/28/2020  [facility] Cardiologist: No primary care provider on file.   Subjective   Sitting in bed. C/o blurriness in his right eye as he has not had his drops. Sitter at the bedside.   Inpatient Medications    Scheduled Meds: . atorvastatin  80 mg Oral Daily  . carvedilol  25 mg Oral BID WC  . dapagliflozin propanediol  10 mg Oral Daily  . enoxaparin (LOVENOX) injection  40 mg Subcutaneous Q24H  . ezetimibe  10 mg Oral Daily  . FLUoxetine  30 mg Oral Daily  . furosemide  40 mg Oral Daily  . hydrALAZINE  75 mg Oral TID  . influenza vac split quadrivalent PF  0.5 mL Intramuscular Tomorrow-1000  . sacubitril-valsartan  1 tablet Oral BID  . spironolactone  25 mg Oral Daily  . timolol  1 drop Both Eyes Daily   Continuous Infusions:  PRN Meds: acetaminophen **OR** acetaminophen, clonazePAM, polyethylene glycol   Vital Signs    Vitals:   12/27/20 2308 12/28/20 0500 12/28/20 0610 12/28/20 0821  BP: (!) 149/92  (!) 161/99 (!) 154/87  Pulse: (!) 56  (!) 58 67  Resp: 20  20   Temp: 98.3 F (36.8 C)  98.1 F (36.7 C)   TempSrc: Oral  Oral   SpO2: 100%  100%   Weight:  86.9 kg    Height:        Intake/Output Summary (Last 24 hours) at 12/28/2020 0946 Last data filed at 12/27/2020 2040 Gross per 24 hour  Intake 880 ml  Output --  Net 880 ml   Last 3 Weights 12/28/2020 12/27/2020 12/26/2020  Weight (lbs) 191 lb 9.6 oz 194 lb 1 oz 194 lb 3.2 oz  Weight (kg) 86.909 kg 88.025 kg 88.089 kg      Telemetry    SR - Personally Reviewed  ECG    No new tracing   Physical Exam   GEN: No acute distress.   Neck: No JVD Cardiac: RRR, no murmurs, rubs, or gallops.  Respiratory: Clear to auscultation bilaterally. GI: Soft, nontender, non-distended  MS: No edema; No deformity. Neuro:  Nonfocal  Psych: Normal affect   Labs    High Sensitivity Troponin:   Recent Labs  Lab 12/24/20 0358  12/24/20 0608  TROPONINIHS 16 19*      Chemistry Recent Labs  Lab 12/24/20 0358 12/24/20 0414 12/26/20 0125 12/27/20 0143 12/28/20 0405  NA 141   < > 138 138 139  K 3.6   < > 3.3* 3.9 3.9  CL 107   < > 103 105 104  CO2 23   < > 25 24 24   GLUCOSE 126*   < > 112* 127* 93  BUN 8   < > 15 18 13   CREATININE 1.41*   < > 1.62* 1.45* 1.35*  CALCIUM 9.0   < > 8.8* 8.6* 9.0  PROT 6.6  --   --   --   --   ALBUMIN 3.6  --   --   --   --   AST 22  --   --   --   --   ALT 37  --   --   --   --   ALKPHOS 64  --   --   --   --   BILITOT 1.0  --   --   --   --   GFRNONAA 60*   < >  50* 58* >60  ANIONGAP 11   < > 10 9 11    < > = values in this interval not displayed.     Hematology Recent Labs  Lab 12/24/20 0358 12/24/20 0414 12/24/20 0614 12/26/20 0125  WBC 8.5  --   --  5.4  RBC 5.26  --   --  5.03  HGB 16.2 16.3 15.3 15.6  HCT 46.6 48.0 45.0 44.0  MCV 88.6  --   --  87.5  MCH 30.8  --   --  31.0  MCHC 34.8  --   --  35.5  RDW 12.9  --   --  12.8  PLT 210  --   --  197    BNP Recent Labs  Lab 12/24/20 0358  BNP 647.2*     DDimer No results for input(s): DDIMER in the last 168 hours.   Radiology    No results found.  Cardiac Studies    Echo: 12/24/20  IMPRESSIONS    1. Left ventricular ejection fraction, by estimation, is <20%. The left  ventricle has severely decreased function. The left ventricle demonstrates  global hypokinesis. The left ventricular internal cavity size was  moderately dilated. Left ventricular  diastolic parameters are consistent with Grade III diastolic dysfunction  (restrictive).  2. Right ventricular systolic function is moderately reduced. The right  ventricular size is normal. There is normal pulmonary artery systolic  pressure.  3. Left atrial size was severely dilated.  4. The mitral valve is normal in structure. Mild mitral valve  regurgitation. No evidence of mitral stenosis.  5. The aortic valve is tricuspid. Aortic  valve regurgitation is not  visualized. No aortic stenosis is present.  6. Aortic dilatation noted. There is mild dilatation of the ascending  aorta, measuring 38 mm.  7. The inferior vena cava is dilated in size with >50% respiratory  variability, suggesting right atrial pressure of 8 mmHg.   Patient Profile     55 y.o. 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 Found to have an EF of <20%.  Assessment & Plan    1. Acute on chronic combined HF:EF this admission severely reduced at <20%. Felt to be NICM in the setting of uncontrolled HTN. Weight is down to 191, net - 3.8L.  -- continue on coreg 25mg  BID with transition to [facility] today -- continue lasix 40mg  daily -- continue spiro and farxiga  -- add BiDil  2. UV:OZDGU from psych reviewed. Plans for admission to inpatient psych hospital for treatment  3. HTN: blood pressures still elevated. -- continue coreg 25mg  BID, transition to Entresto -- switch to BiDil -- continue spiro  4. HLD:on high dose statin and Zetia  -- LDL 49  5. DM: home medications were glipizide.  -- Hgb A1c 5.5 -- now on farxiga   6. Hypokalemia:stable at 3.9, on K+ supplement. Monitor with addition of spiro and Entresto  HF PharmD following with assistance for medications. According to notes, medication will be no cost to the patient.   [facility] will sign off.   Medication Recommendations:  Noted above Other recommendations (labs, testing, etc):  none Follow up as an outpatient:  Will arrange for outpatient follow up once disposition is finalized.   For questions or updates, please contact [place] Please consult [email or web address] for contact info under        Signed,  Reino Bellis, NP  12/28/2020, 9:46 AM    I have examined the patient and reviewed assessment and plan and discussed with patient.  Agree with  above as stated.  Medical therapy for NICM.  He wants to go home if he cannot go to [facility] soon.  Will need compliance with meds and further monitoring of his BP, with med titration.  Larae Grooms

## 2020-12-28 NOTE — TOC Progression Note (Signed)
Transition of Care (TOC) - Progression Note    Patient Details  Name: Matthew [name]. MRN: [id number] Date of Birth: [birth year]  Transition of Care [facility]) CM/SW [place], [place] Phone Number: 12/28/2020, 2:34 PM  Clinical Narrative:    CSW contacted Audree Camel at [facility] by secure message for possible placement of pt.  Judson Roch will inform admission coordinator of possible placement.  TOC team will continue to assist with disposition planning.   Expected Discharge Plan: Skilled Nursing Facility Barriers to Discharge:  (pscyh bed availability)  Expected Discharge Plan and Services Expected Discharge Plan: [place] In-house Referral: Clinical Social Work                                             Social Determinants of Health (SDOH) Interventions    Readmission Risk Interventions No flowsheet data found.

## 2020-12-29 NOTE — Progress Notes (Signed)
HD#5 Subjective:   Overnight Events: No acute events overnight.  Patient seen at bedside today, he states that he wants to go home and is tired of being in the hospital. We discussed hospital protocol and needing clearance by psych before he can go home, but he currently meets requirements for behavioral health. Patient wishes to speak with psych.   Objective:   Vital signs in last 24 hours: Vitals:   12/28/20 1705 12/28/20 1841 12/28/20 2320 12/29/20 0426  BP: (!) 151/90 130/79 117/80 133/76  Pulse: 60 67 66 66  Resp:  16 18 20   Temp:  98.3 F (36.8 C) 98.1 F (36.7 C) 98.5 F (36.9 C)  TempSrc:  Oral Oral Oral  SpO2:  95% 100% 95%  Weight:    87.1 kg  Height:       Supplemental O2: room air SpO2: 95 % O2 Flow Rate (L/min): 2 L/min FiO2 (%): 100 %  Physical Exam Physical Exam Vitals and nursing note reviewed.  Constitutional:      General: He is not in acute distress.    Appearance: He is well-developed and normal weight. He is not ill-appearing.  Cardiovascular:     Rate and Rhythm: Normal rate and regular rhythm.     Pulses: Normal pulses.     Heart sounds: Normal heart sounds. No murmur heard. No gallop.   Pulmonary:     Effort: Pulmonary effort is normal. No respiratory distress.     Breath sounds: Normal breath sounds. No stridor. No wheezing, rhonchi or rales.  Skin:    General: Skin is warm and dry.  Neurological:     General: No focal deficit present.     Mental Status: He is alert and oriented to person, place, and time. Mental status is at baseline.  Psychiatric:     Comments: Blunted affect. Inappropriate laughter     Filed Weights   12/27/20 0326 12/28/20 0500 12/29/20 0426  Weight: 88 kg 86.9 kg 87.1 kg    Intake/Output Summary (Last 24 hours) at 12/29/2020 0553 Last data filed at 12/28/2020 2322 Gross per 24 hour  Intake 680 ml  Output 640 ml  Net 40 ml   Net IO Since Admission: -3,792.11 mL [12/29/20 0553]  Pertinent Labs: CBC  Latest Ref Rng & Units 12/26/2020 12/24/2020 12/24/2020  WBC 4.0 - 10.5 K/uL 5.4 - -  Hemoglobin 13.0 - 17.0 g/dL 15.6 15.3 16.3  Hematocrit 39.0 - 52.0 % 44.0 45.0 48.0  Platelets 150 - 400 K/uL 197 - -    CMP Latest Ref Rng & Units 12/28/2020 12/27/2020 12/26/2020  Glucose 70 - 99 mg/dL 93 127(H) 112(H)  BUN 6 - 20 mg/dL 13 18 15   Creatinine 0.61 - 1.24 mg/dL 1.35(H) 1.45(H) 1.62(H)  Sodium 135 - 145 mmol/L 139 138 138  Potassium 3.5 - 5.1 mmol/L 3.9 3.9 3.3(L)  Chloride 98 - 111 mmol/L 104 105 103  CO2 22 - 32 mmol/L 24 24 25   Calcium 8.9 - 10.3 mg/dL 9.0 8.6(L) 8.8(L)  Total Protein 6.5 - 8.1 g/dL - - -  Total Bilirubin 0.3 - 1.2 mg/dL - - -  Alkaline Phos 38 - 126 U/L - - -  AST 15 - 41 U/L - - -  ALT 0 - 44 U/L - - -    Imaging: No results found.   12/24/20 Echocardiogram IMPRESSIONS  1. Left ventricular ejection fraction, by estimation, is <20%. The left  ventricle has severely decreased function. The left ventricle demonstrates  global hypokinesis. The left ventricular internal cavity size was  moderately dilated. Left ventricular  diastolic parameters are consistent with Grade III diastolic dysfunction  (restrictive).  2. Right ventricular systolic function is moderately reduced. The right  ventricular size is normal. There is normal pulmonary artery systolic  pressure.  3. Left atrial size was severely dilated.  4. The mitral valve is normal in structure. Mild mitral valve  regurgitation. No evidence of mitral stenosis.  5. The aortic valve is tricuspid. Aortic valve regurgitation is not  visualized. No aortic stenosis is present.  6. Aortic dilatation noted. There is mild dilatation of the ascending  aorta, measuring 38 mm.  7. The inferior vena cava is dilated in size with >50% respiratory  variability, suggesting right atrial pressure of 8 mmHg.   Assessment/Plan:   Active Problems:   Hypertensive urgency   Acute exacerbation of congestive heart failure  ([place])  Patient Summary:  Matthew [name]. 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  This is hospital day 5.  Acute on Chronic Combined Systolic and Diastolic Heart Failure; now with worsening diastolic and biventricular heart failure Patient appears euvolemic on examination, weight has reached plateau 87 kg . Patient is medically stable for D/C pending Psych placement. -Continue BiDil  -Continue entresto -Continue spironolactone and farxiga.  -Continue lasix 40 mg IV -I/O -3.L cumulative net  -strict I&O, daily weights  Severe Symptomatic Hypertension 2/2 Medication Non-Adherence BP improved 133/76 this am -medications changes as per above -continue spironolactone   Depression Suicidal ideation Evaluated by psych. Recommend inpatient psych once medically cleared.  - Will consult psych per patient's request. - Continue fluoxetine 30 mg daily - Suicide precautions: 1:1 sitter - Klonopin 0.25 mg qHS PRN  CKD Stage 2 sCr 1.35(baseline ~1.40-1.70)  Type II diabetes mellitus Prescribed glipizide 5 mg twice daily with meals. Reports non-adherence. Random BG 126 on arrival. Will hold home glipizide while in the hospital. - CBG monitoring - SSI   Hyperlipidemia - Continue home atorvastatin 80 mg daily - Continue home ezetimibe (Zetia) 10 mg daily   Diet: Heart Healthy VTE: Enoxaparin IVF: None Code: DNR  Dispo: Anticipated discharge to inpatient psych pending placement   Please contact the on call pager after 5 pm and on weekends at [phone].  Sanjuana Letters DO  PGY-1 Internal Medicine Teaching Service Pager: [phone] 12/29/2020

## 2020-12-29 NOTE — TOC Progression Note (Signed)
Transition of Care (TOC) - Progression Note    Patient Details  Name: Matthew [name]. MRN: [id number] Date of Birth: 10-21-1966  Transition of Care [facility]) CM/SW [place], [name] Phone Number: 12/29/2020, 3:04 PM  Clinical Narrative:     Left voice message for [facility]-waiting on response Made referral to [facility]- waiting on response  Thurmond Butts, MSW, LCSW Clinical Social Worker     Expected Discharge Plan: Psychiatric Hospital Barriers to Discharge: Psych Bed not available (pscyh bed availability)  Expected Discharge Plan and Services Expected Discharge Plan: [facility] In-house Referral: Clinical Social Work                                             Social Determinants of Health (SDOH) Interventions    Readmission Risk Interventions No flowsheet data found.

## 2020-12-29 NOTE — Progress Notes (Signed)
Heart Failure Nurse Navigator Progress Note  Visited patient to update on medicaid and disability application process.  Pt sleeping in bed facing window, Sitter at bedside. Pt aroused and willing to chat. Spirits down today, wanting to go home. Reiterated plan for DC is safety first, going to [facility]. Pt mopey, but also agreed that is the best option, pt "just tired of being here". Told pt I would continue to visit him daily for social support chats and updates, pt appreciates.   Lunch arrived. Pt c/o headache, bedside RN informed.   Items for Follow-up on DC/TOC: 1. Substance abuse cessation. 2. Cardiology f/u appt.  3. Financial security issues. 4. Insurance needs- applications being completed by financial counselors.  5. Low/no social support.  6. Food insecurity issues- food stamps. 7. Safe DC plan- awaiting [facility] bed.  Pricilla Holm, RN, BSN Heart Failure Nurse Navigator Advanced Heart Failure Program [phone]

## 2020-12-29 NOTE — Progress Notes (Addendum)
Heart Failure Stewardship Pharmacist Progress Note   PCP: Charlott Rakes, MD PCP-Cardiologist: No primary care provider on file.    HPI:  55 yo M with PMH of HTN, HLD, DM, CVA, and CHF. He presented to the ED for shortness of breath and noted he ran out of his medications. An ECHO was done on 12/24/20 and LVEF was <20% (was 40-45% in March 2021).  Current HF Medications: Furosemide 40 mg daily Carvedilol 25 mg BID Entresto 49/51 mg BID Spironolactone 25 mg daily Farxiga 10 mg daily BiDil 20/37.5 mg 2 tablets TID  Prior to admission HF Medications:    Not taking these medications Furosemide 40 mg daily Carvedilol 12.5 mg BID Lisinopril 5 mg daily Hydralazine 50 mg TID  Pertinent Lab Values (as of 1/12): Marland Kitchen Serum creatinine 1.35, BUN 13, Potassium 3.9, Sodium 139, BNP 647, Magnesium 1.8   Vital Signs: . Weight: 192 lbs (admission weight: 195 lbs) . Blood pressure: 120-130/70s  . Heart rate: 60s   Medication Assistance / Insurance Benefits Check: Does the patient have prescription insurance?  No  Does the patient qualify for medication assistance through manufacturers or grants?   Yes . Eligible grants and/or patient assistance programs: Lisabeth Register, BiDil . Medication assistance applications in progress: Lisabeth Register, BiDil  . Medication assistance applications approved: none Approved medication assistance renewals will be completed by: [place]:  Prior to admission outpatient pharmacy: [facility] Is the patient willing to use [facility] at discharge? Yes    Assessment: 1. Acute on chronic systolic CHF (EF <64%), due to NICM, medication noncompliance. NYHA class II symptoms. - Continue furosemide 40 mg daily - Continue carvedilol 25 mg BID - Continue Entresto 49/51 mg BID - Continue spironolactone 25 mg daily - Continue Farxiga 10 mg daily - Continue BiDil 20/37.5 mg 2 tabs TID    Plan: 1) Medication changes  recommended at this time: - Continue current regimen  - Order BMET for AM  2) Patient assistance application(s): - Delene Loll, Farxiga, and BiDil applications in process to bring copay down to $0 - HF Navigator following - Inpatient CSW and Financial Counselors to help with enrollment in Medicaid  3)  Education  - To be completed prior to discharge  Kerby Nora, PharmD, BCPS Heart Failure Cytogeneticist Phone [phone]

## 2020-12-29 NOTE — Progress Notes (Signed)
CSW referred to assist with uninsured patient obtaining insurance and disability. CSW contacted financial counselor Toniann Ket to follow up on needs. CSW informed that patient has been referred to [facility] for medicaid application and they will assess for disability and refer to the [facility] for assistance with disability application. The Med Assist caseworker is Wilburn Cornelia [phone]) if further information is needed. CSW available as needed and will follow up post hospitalization if needed. Raquel Sarna, [place], [place]

## 2020-12-29 NOTE — TOC Progression Note (Signed)
Transition of Care (TOC) - Progression Note    Patient Details  Name: Matthew [name]. MRN: [id number] Date of Birth: 06/27/1966  Transition of Care [facility]) CM/SW [place], [name] Phone Number: 12/29/2020, 10:10 AM  Clinical Narrative:     CSW called [facility], left voice message to return call.  Thurmond Butts, MSW, LCSW Clinical Social Worker'    Expected Discharge Plan: [place] Barriers to Discharge:  (pscyh bed availability)  Expected Discharge Plan and Services Expected Discharge Plan: [place] In-house Referral: Clinical Social Work                                             Social Determinants of Health (SDOH) Interventions    Readmission Risk Interventions No flowsheet data found.

## 2020-12-30 DIAGNOSIS — F332 Major depressive disorder, recurrent severe without psychotic features: Secondary | ICD-10-CM

## 2020-12-30 LAB — BASIC METABOLIC PANEL
Anion gap: 10 (ref 5–15)
BUN: 15 mg/dL (ref 6–20)
CO2: 21 mmol/L — ABNORMAL LOW (ref 22–32)
Calcium: 8.9 mg/dL (ref 8.9–10.3)
Chloride: 107 mmol/L (ref 98–111)
Creatinine, Ser: 1.36 mg/dL — ABNORMAL HIGH (ref 0.61–1.24)
GFR, Estimated: >60 mL/min (ref >60)
Glucose, Bld: 100 mg/dL — ABNORMAL HIGH (ref 70–99)
Potassium: 4.1 mmol/L (ref 3.5–5.1)
Sodium: 138 mmol/L (ref 135–145)

## 2020-12-30 NOTE — Consult Note (Signed)
[facility] Consult   Reason for Consult:  Suicidal ideations with a plan Referring Physician:  Dr. Philipp Ovens Patient Identification: Matthew [name]. MRN:  [id number] Principal Diagnosis: <principal problem not specified> Diagnosis:  Active Problems:   Hypertensive urgency   Acute exacerbation of congestive heart failure ([place])   Total Time spent with patient: 30 minutes  Subjective:   Matthew [name]. is a 55 y.o. male patient admitted with shortness of breath. Psych consult placed for suicidal ideations with a plan. Patient is observed lying in bed, and acknowledges writer upon entry into the room.  He is observed to be smiling, pleasant, and cooperative with today's evaluation.  Writer again introduced self and reason for visit.  Patient does admit to depressive symptoms and appears to be motivated to to seek appropriate treatment.  He also shows some insight into his underlying depression and worsening outcomes related to his cardiac condition.  He denies suicidal ideations at this current time, and is also able to contract for safety.  However he does admit to worsening depression, suicidal thoughts, and access to weapons.   On evaluation he was alert and oriented, grudgingly cooperative.  Patient does admit to worsening depressive symptoms that include isolation, withdrawn, suicidal thoughts, recurrent thoughts of death, weight loss, insomnia, and headaches.  He continues to endorse his depression did get worse after losing his wife, after 22 years of marriage.  He is unable to identify any protective factors at this time, and remains at high risk for suicide completion.  He does agree to inpatient admission at this time, however will remain involuntary as he has been wavering about his decision to seek treatment.  He continues to have limited insight and poor judgment on the impacts his depression is taking on his physical condition, medication nonadherence and  noncompliance, that have resulted in multiple hospital admissions since the death of his wife.. Patient with worsening depression and now endorsing suicidal thoughts with a plan, with intentions and access to weapons. He has a high risk to complete suicide, also must consider the upcoming anniversary of his wife's death.   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  The patient is vague with his report of his symptoms and timeline. The patient states he was in his usual state of health until yesterday when he began to "feel bad" and became short of breath. The shortness of breath improves with laying flat and worsens with exertion. Associated with chest pressure which he reports started yesterday evening and radiates down his right arm, however he attributes it to hunger. Endorses a left-sided headache with onset earlier today. States his vision is blurry bilaterally chronically. Denies nausea, vomiting, lower extremity swelling,  dysuria. Reports he ran out of most of his medications ~1 week ago and wasn't able to get a ride to pick up refills. Reports intermittent adherence to his medications since his wife died suddenly while he was hospitalized in 02/2020. Endorses low energy, poor sleep, guilt surrounding his wife's death since he wasn't with her when she died, and thoughts of hurting himself. With regard to his suicidal ideation, he states he has plans but has not had attempts, has guns in every room  of his house. When asked what has kept him from acting on his plans, he states he didn't want to ruin his family's holiday. Reports since his wife died, he has not seen his family as  much.   Admitted 07/04/20-07/06/20 with acute hypoxic respiratory failure secondary to acute CHF and hypertensive urgency also in the setting of poor adherence of his medications.  Past Psychiatric History: Denies  Risk to Self:  Yes Risk to Others:  NO Prior Inpatient Therapy:  Denies Prior Outpatient Therapy:  Denies  Past Medical History:  Past Medical History:  Diagnosis Date  . Acute combined systolic and diastolic CHF, NYHA class 3 ([place])    a. 02/2014 Echo: EF 25-30%.  . Cardiomyopathy ([place])    a. 02/2014 Echo: EF 25-30%, sev glob HK with inferolat HK->AK, mod conc LVH, Gr 2 DD, Mild MR, sev dil LA.  . Depression   . DM (diabetes mellitus) ([place])   . High cholesterol   . Hypertension   . Morbid obesity ([place])   . Tobacco abuse     Past Surgical History:  Procedure Laterality Date  . CATARACT EXTRACTION Right   . LEFT HEART CATHETERIZATION WITH CORONARY ANGIOGRAM N/A 03/04/2014   Procedure: LEFT HEART CATHETERIZATION WITH CORONARY ANGIOGRAM;  Surgeon: Burnell Blanks, MD;  Location: [facility] CATH LAB;  Service: Cardiovascular;  Laterality: N/A;  . TEE WITHOUT CARDIOVERSION N/A 02/04/2017   Procedure: TRANSESOPHAGEAL ECHOCARDIOGRAM (TEE);  Surgeon: Dorothy Spark, MD;  Location: [facility] ENDOSCOPY;  Service: Cardiovascular;  Laterality: N/A;   Family History:  Family History  Problem Relation Age of Onset  . Heart attack Father        died @ 60  . Cervical cancer Mother        died @ 27   Family Psychiatric  History: Denies Social History:  Social History   Substance and Sexual Activity  Alcohol Use No     Social History   Substance and Sexual Activity  Drug Use No    Social History   Socioeconomic History  . Marital status: Widowed    Spouse name: Not on file  . Number of children: 2  . Years of education: 15  . Highest education level: Not on file  Occupational History  . Occupation: [employer]  Tobacco Use  . Smoking status: Current Some Day Smoker     Years: 20.00    Types: Cigarettes  . Smokeless tobacco: Never Used  Vaping Use  . Vaping Use: Never used  Substance and Sexual Activity  . Alcohol use: No  . Drug use: No  . Sexual activity: Yes  Other Topics Concern  . Not on file  Social History Narrative   Lives with 2 young children in [place].  Wife died in May 10, 2020.  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   Left-handed   Caffeine: occasional tea   Social Determinants of Radio broadcast assistant Strain: Not on file  Food Insecurity: Not on file  Transportation Needs: Not on file  Physical Activity: Not on file  Stress: Not on file  Social Connections: Not on file   Additional Social History:    Allergies:  No Known Allergies  Labs:  Results for orders placed or performed during the hospital encounter of 12/24/20 (from the past 48 hour(s))  Basic metabolic panel     Status: Abnormal   Collection Time: 12/30/20  4:04 AM  Result Value Ref Range  Sodium 138 135 - 145 mmol/L   Potassium 4.1 3.5 - 5.1 mmol/L   Chloride 107 98 - 111 mmol/L   CO2 21 (L) 22 - 32 mmol/L   Glucose, Bld 100 (H) 70 - 99 mg/dL    Comment: Glucose reference range applies only to samples taken after fasting for at least 8 hours.   BUN 15 6 - 20 mg/dL   Creatinine, Ser 2.77 (H) 0.61 - 1.24 mg/dL   Calcium 8.9 8.9 - 41.2 mg/dL   GFR, Estimated >87 >86 mL/min    Comment: (NOTE) Calculated using the CKD-EPI Creatinine Equation (2021)    Anion gap 10 5 - 15    Comment: Performed at [facility], [address], [place], [place] [address]    Current Facility-Administered Medications  Medication Dose Route Frequency Provider Last Rate Last Admin  . acetaminophen (TYLENOL) tablet 650 mg  650 mg Oral Q6H PRN Dolan Amen, MD   650 mg at 12/30/20 0355   Or  . acetaminophen (TYLENOL) suppository 650 mg  650 mg Rectal Q6H PRN Dolan Amen, MD      . atorvastatin (LIPITOR) tablet 80 mg  80 mg Oral Daily Dolan Amen, MD   80 mg at 12/30/20 0917  . carvedilol (COREG) tablet 25 mg  25 mg Oral BID WC Alphonzo Severance, MD   25 mg at 12/30/20 0916  . clonazePAM (KLONOPIN) tablet 0.5 mg  0.5 mg Oral QHS PRN Reymundo Poll, MD   0.5 mg at 12/29/20 2154  . dapagliflozin propanediol (FARXIGA) tablet 10 mg  10 mg Oral Daily Laverda Page B, NP   10 mg at 12/30/20 0917  . enoxaparin (LOVENOX) injection 40 mg  40 mg Subcutaneous Q24H Dolan Amen, MD   40 mg at 12/29/20 1151  . ezetimibe (ZETIA) tablet 10 mg  10 mg Oral Daily Dolan Amen, MD   10 mg at 12/30/20 0917  . FLUoxetine (PROZAC) capsule 30 mg  30 mg Oral Daily Maryagnes Amos, FNP   30 mg at 12/30/20 0916  . furosemide (LASIX) tablet 40 mg  40 mg Oral Daily Silvana Newness, RPH   40 mg at 12/30/20 9470  . isosorbide-hydrALAZINE (BIDIL) 20-37.5 MG per tablet 2 tablet  2 tablet Oral TID Arty Baumgartner, NP   2 tablet at 12/30/20 0917  . polyethylene glycol (MIRALAX / GLYCOLAX) packet 17 g  17 g Oral Daily PRN Dolan Amen, MD      . sacubitril-valsartan (ENTRESTO) 49-51 mg per tablet  1 tablet Oral BID Laverda Page B, NP   1 tablet at 12/30/20 9628  . spironolactone (ALDACTONE) tablet 25 mg  25 mg Oral Daily Laverda Page B, NP   25 mg at 12/30/20 0917  . timolol (TIMOPTIC) 0.5 % ophthalmic solution 1 drop  1 drop Both Eyes Daily Laverda Page B, NP   1 drop at 12/30/20 0919    Musculoskeletal: Strength & Muscle Tone: Gait & Station: normal Patient leans: N/A  Psychiatric Specialty Exam: Physical Exam   Review of Systems   Blood pressure 128/81, pulse 72, temperature 98.6 F (37 C), temperature source Oral, resp. rate 18, height 5\' 9"  (1.753 m), weight 87 kg, SpO2 98 %.Body mass index is 28.32 kg/m.  General Appearance: Fairly Groomed  Eye Contact:  Fair  Speech:  Clear and Coherent and Normal Rate  Volume:  Normal  Mood:  Anxious and Depressive smiling  Affect:  Appropriate and Congruent  Thought Process:  Coherent, Linear and Descriptions of Associations: Intact  Orientation:  Full (Time, Place, and Person)  Thought Content:  Logical  Suicidal Thoughts:  No    Homicidal Thoughts:  No  Memory:  Immediate;   Fair Recent;   Fair  Judgement:  Poor  Insight:  Shallow  Psychomotor Activity:  Normal  Concentration:  Concentration: Fair and Attention Span: Fair  Recall:  [employer] of Knowledge:  Fair  Language:  Fair  Akathisia:  NA  Handed:  Right  AIMS (if indicated):     Assets:  Communication Skills Desire for Improvement Financial Resources/Insurance Leisure Time Physical Health  ADL's:  Intact  Cognition:  WNL  Sleep:        Treatment Plan Summary: No changes to recommendations below.  We will continue to recommend inpatient at this time, patient is now in agreement for voluntary admission after citing he has high risk factors for suicide. Plan Increase Prozac 30mg  po daily, recently started taking this medication. Will recommend working closely with SW to get him into an inpatient psych hospital. Patient has a history of leaving AMA. Will need to be placed under IVC if he attempts to leave.  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  -IVC if needed.  -Recommend working closely with SW to facilitate inpatient admission to psych hospital once he is medically stable.  -Will continue Prozac 30mg  po daily for depression.   Disposition: Recommend psychiatric Inpatient admission when medically cleared.  Suella Broad, FNP 12/30/2020 11:37 AM

## 2020-12-30 NOTE — Progress Notes (Signed)
Heart Failure Nurse Navigator Progress Note  Visited patient today as a social supportive visit, sitter at bedside. Stated his water had been shut off at his house. I tried to call water company to assist but no answer. Encouraged pt to walk in halls with NT this today and this weekend. Pt c/o lightheadedness when he ambulated back from the bathroom. Pt still agreeable to DC plan for [facility]. Will continue to visit patient and assist as able.   Items for Follow-up on DC/TOC: 1. Substance abuse cessation. 2. Cardiology f/u appt.  3. Financial security issues. 4. Insurance needs- applications being completed by financial counselors.  5. Low/no social support.  6. Food insecurity issues- food stamps. 7. Safe DC plan- awaiting [facility] bed.  Pricilla Holm, RN, BSN Heart Failure Nurse Navigator [phone]

## 2020-12-30 NOTE — TOC Progression Note (Signed)
Transition of Care (TOC) - Progression Note    Patient Details  Name: Matthew [name]. MRN: [id number] Date of Birth: 10-Jun-1966  Transition of Care [facility]) CM/SW [place], [name] Phone Number: 12/30/2020, 10:04 AM  Clinical Narrative:     Patient has no bed offers with [facility] at this time - CSW will continue to follow and seek [facility] placement.   Thurmond Butts, MSW, LCSW Clinical Social Worker   Expected Discharge Plan: Psychiatric Hospital Barriers to Discharge: Psych Bed not available (pscyh bed availability)  Expected Discharge Plan and Services Expected Discharge Plan: [facility] In-house Referral: Clinical Social Work                                             Social Determinants of Health (SDOH) Interventions    Readmission Risk Interventions No flowsheet data found.

## 2020-12-30 NOTE — Progress Notes (Signed)
HD#6 Subjective:   Overnight Events: No acute events overnight.  Reports feeling better today, he was abel to shave yesterday. Denies any SHOB or chest pain. Discussed speaking with Psych and still waiting for bed.  No question or concerns today.  Objective:   Vital signs in last 24 hours: Vitals:   12/29/20 1639 12/30/20 0024 12/30/20 0453 12/30/20 0916  BP: 125/73 135/75 (!) 144/81 128/81  Pulse: (!) 58 67 70 72  Resp: 20 18 18    Temp: 98 F (36.7 C) 97.9 F (36.6 C) 98.6 F (37 C)   TempSrc: Oral Oral Oral   SpO2: 98% 99% 98%   Weight:   87 kg   Height:       Supplemental O2: room air SpO2: 95 % O2 Flow Rate (L/min): 2 L/min FiO2 (%): 100 %  Physical Exam Physical Exam Vitals and nursing note reviewed.  Constitutional:      General: He is not in acute distress.    Appearance: He is well-developed and normal weight. He is not ill-appearing.  Cardiovascular:     Rate and Rhythm: Normal rate and regular rhythm.     Pulses: Normal pulses.     Heart sounds: Normal heart sounds. No murmur heard. No gallop.   Pulmonary:     Effort: Pulmonary effort is normal. No respiratory distress.     Breath sounds: Normal breath sounds. No stridor. No wheezing, rhonchi or rales.  Skin:    General: Skin is warm and dry.  Neurological:     General: No focal deficit present.     Mental Status: He is alert. Mental status is at baseline.  Psychiatric:     Comments: Blunted affect     Filed Weights   12/28/20 0500 12/29/20 0426 12/30/20 0453  Weight: 86.9 kg 87.1 kg 87 kg    Intake/Output Summary (Last 24 hours) at 12/30/2020 1050 Last data filed at 12/30/2020 0935 Gross per 24 hour  Intake 720 ml  Output 900 ml  Net -180 ml   Net IO Since Admission: -3,652.11 mL [12/30/20 1050]  Pertinent Labs: CBC Latest Ref Rng & Units 12/26/2020 12/24/2020 12/24/2020  WBC 4.0 - 10.5 K/uL 5.4 - -  Hemoglobin 13.0 - 17.0 g/dL 15.6 15.3 16.3  Hematocrit 39.0 - 52.0 % 44.0 45.0 48.0   Platelets 150 - 400 K/uL 197 - -    CMP Latest Ref Rng & Units 12/30/2020 12/28/2020 12/27/2020  Glucose 70 - 99 mg/dL 100(H) 93 127(H)  BUN 6 - 20 mg/dL 15 13 18   Creatinine 0.61 - 1.24 mg/dL 1.36(H) 1.35(H) 1.45(H)  Sodium 135 - 145 mmol/L 138 139 138  Potassium 3.5 - 5.1 mmol/L 4.1 3.9 3.9  Chloride 98 - 111 mmol/L 107 104 105  CO2 22 - 32 mmol/L 21(L) 24 24  Calcium 8.9 - 10.3 mg/dL 8.9 9.0 8.6(L)  Total Protein 6.5 - 8.1 g/dL - - -  Total Bilirubin 0.3 - 1.2 mg/dL - - -  Alkaline Phos 38 - 126 U/L - - -  AST 15 - 41 U/L - - -  ALT 0 - 44 U/L - - -    Imaging: No results found.   12/24/20 Echocardiogram IMPRESSIONS  1. Left ventricular ejection fraction, by estimation, is <20%. The left  ventricle has severely decreased function. The left ventricle demonstrates  global hypokinesis. The left ventricular internal cavity size was  moderately dilated. Left ventricular  diastolic parameters are consistent with Grade III diastolic dysfunction  (restrictive).  2. Right ventricular systolic function is moderately reduced. The right  ventricular size is normal. There is normal pulmonary artery systolic  pressure.  3. Left atrial size was severely dilated.  4. The mitral valve is normal in structure. Mild mitral valve  regurgitation. No evidence of mitral stenosis.  5. The aortic valve is tricuspid. Aortic valve regurgitation is not  visualized. No aortic stenosis is present.  6. Aortic dilatation noted. There is mild dilatation of the ascending  aorta, measuring 38 mm.  7. The inferior vena cava is dilated in size with >50% respiratory  variability, suggesting right atrial pressure of 8 mmHg.   Assessment/Plan:   Active Problems:   Hypertensive urgency   Acute exacerbation of congestive heart failure ([place])  Patient Summary:  Matthew [name]. is a 55 year old man with history of HFmrEF (02/15/20 Echo EF 40-45%), hypertension, hyperlipidemia, non-insulin dependent  type 2 diabetes, CVA who presents with shortness of breath after running out of his medications ~1 week ago and admitted for further evaluation and management of hypertensive emergency and acute on chronic heart failure.  This is hospital day 6.  Acute on Chronic Combined Systolic and Diastolic Heart Failure; now with worsening diastolic and biventricular heart failure Patient continues to appear euvolemic on examination, weight has reached plateau 87 kg . Patient is medically stable for D/C pending Psych placement. -Continue BiDil  -Continue entresto -Continue spironolactone and farxiga.  -Continue lasix 40 mg IV -I/O -3.L cumulative net  -strict I&O, daily weights  Severe Symptomatic Hypertension 2/2 Medication Non-Adherence BP improved 128/81 this am -medications changes as per above -continue spironolactone   Depression Suicidal ideation Evaluated by psych. Recommend inpatient psych once medically cleared.  - Reconsulted psych per patient's request.  - Continue fluoxetine 30 mg daily - Suicide precautions: 1:1 sitter - Klonopin 0.25 mg qHS PRN  CKD Stage 2 sCr 1.36(baseline ~1.40-1.70)  Type II diabetes mellitus Prescribed glipizide 5 mg twice daily with meals. Reports non-adherence. Random BG 126 on arrival. Will hold home glipizide while in the hospital. - CBG monitoring - SSI   Hyperlipidemia - Continue home atorvastatin 80 mg daily - Continue home ezetimibe (Zetia) 10 mg daily   Diet: Heart Healthy VTE: Enoxaparin IVF: None Code: DNR  Dispo: Anticipated discharge to inpatient psych pending placement op   Please contact the on call pager after 5 pm and on weekends at [phone].  Sanjuana Letters DO  PGY-1 Internal Medicine Teaching Service Pager: [phone] 12/30/2020

## 2020-12-30 NOTE — Progress Notes (Signed)
Heart Failure Stewardship Pharmacist Progress Note   PCP: Charlott Rakes, MD PCP-Cardiologist: No primary care provider on file.    HPI:  55 yo M with PMH of HTN, HLD, DM, CVA, and CHF. He presented to the ED for shortness of breath and noted he ran out of his medications. An ECHO was done on 12/24/20 and LVEF was <20% (was 40-45% in March 2021).  Current HF Medications: Furosemide 40 mg daily Carvedilol 25 mg BID Entresto 49/51 mg BID Spironolactone 25 mg daily Farxiga 10 mg daily BiDil 20/37.5 mg 2 tablets TID  Prior to admission HF Medications:    Not taking these medications Furosemide 40 mg daily Carvedilol 12.5 mg BID Lisinopril 5 mg daily Hydralazine 50 mg TID  Pertinent Lab Values: . Serum creatinine 1.36, BUN 15, Potassium 4.1, Sodium 138, BNP 647  Vital Signs: . Weight: 191 lbs (admission weight: 195 lbs) . Blood pressure: 100-130/70s  . Heart rate: 60-70s   Medication Assistance / Insurance Benefits Check: Does the patient have prescription insurance?  No  Does the patient qualify for medication assistance through manufacturers or grants?   Yes . Eligible grants and/or patient assistance programs: Lisabeth Register, BiDil . Medication assistance applications in progress: Lisabeth Register, BiDil  . Medication assistance applications approved: none Approved medication assistance renewals will be completed by: [place]:  Prior to admission outpatient pharmacy: [facility] Is the patient willing to use [facility] at discharge? Yes    Assessment: 1. Acute on chronic systolic CHF (EF <33%), due to NICM, medication noncompliance. NYHA class II symptoms. - Continue furosemide 40 mg daily - Continue carvedilol 25 mg BID - Continue Entresto 49/51 mg BID - Continue spironolactone 25 mg daily - Continue Farxiga 10 mg daily - Continue BiDil 20/37.5 mg 2 tabs TID    Plan: 1) Medication changes recommended at this time: -  Continue current regimen   2) Patient assistance application(s): - Delene Loll, Farxiga, and BiDil applications in process to bring copay down to $0 - HF Navigator following - Inpatient CSW and Financial Counselors to help with enrollment in Medicaid  3)  Education  - To be completed prior to discharge  Kerby Nora, PharmD, BCPS Heart Failure Cytogeneticist Phone [phone]

## 2020-12-30 NOTE — Progress Notes (Deleted)
Made in Error

## 2020-12-31 NOTE — Progress Notes (Signed)
HD#7 Subjective:   Overnight Events: Headache  Patient endorses frustration with having to wait for inpatient psych and would like to be discharged home, then once a bed is available he states he would go to the facility. I informed that after his re-evaluation by pysch, they recommended inpatient facility transfer directly from this hospital. He continued to endorse frustration.   He also notes a headache after taking his "new medications." He states the headache is bilateral and is on the side of his head. Denies worsening with light. Denies visual changes. Denies maxillary sinus pain. He notes pain is similar to pain he had in the past when he was given a "drip" blood pressure medication. I asked him if this was nitroglycerin and he said yes. He notes the PRN tylenol does give him some relief.   No question or concerns today.  Objective:   Vital signs in last 24 hours: Vitals:   12/30/20 1827 12/30/20 2336 12/31/20 0125 12/31/20 0605  BP: 115/64 105/67  126/78  Pulse: 65 64  62  Resp: 16 18  18   Temp: 98.5 F (36.9 C) 98.4 F (36.9 C)  98.4 F (36.9 C)  TempSrc: Oral Oral  Oral  SpO2: 97% 97%  98%  Weight:   87.5 kg   Height:       Supplemental O2: room air SpO2: 95 % O2 Flow Rate (L/min): 2 L/min FiO2 (%): 100 %  Physical Exam Physical Exam Vitals and nursing note reviewed.  Constitutional:      General: He is not in acute distress.    Appearance: He is well-developed and normal weight. He is not ill-appearing.  Cardiovascular:     Rate and Rhythm: Normal rate and regular rhythm.     Pulses: Normal pulses.     Heart sounds: Normal heart sounds. No murmur heard. No gallop.   Pulmonary:     Effort: Pulmonary effort is normal. No respiratory distress.     Breath sounds: Normal breath sounds. No stridor. No wheezing, rhonchi or rales.  Skin:    General: Skin is warm and dry.  Neurological:     General: No focal deficit present.     Mental Status: He is alert.  Mental status is at baseline.  Psychiatric:     Comments: Blunted affect     Filed Weights   12/29/20 0426 12/30/20 0453 12/31/20 0125  Weight: 87.1 kg 87 kg 87.5 kg    Intake/Output Summary (Last 24 hours) at 12/31/2020 0639 Last data filed at 12/30/2020 1202 Gross per 24 hour  Intake 360 ml  Output --  Net 360 ml   Net IO Since Admission: -3,532.11 mL [12/31/20 0639]  Pertinent Labs: CBC Latest Ref Rng & Units 12/26/2020 12/24/2020 12/24/2020  WBC 4.0 - 10.5 K/uL 5.4 - -  Hemoglobin 13.0 - 17.0 g/dL 15.6 15.3 16.3  Hematocrit 39.0 - 52.0 % 44.0 45.0 48.0  Platelets 150 - 400 K/uL 197 - -    CMP Latest Ref Rng & Units 12/30/2020 12/28/2020 12/27/2020  Glucose 70 - 99 mg/dL 100(H) 93 127(H)  BUN 6 - 20 mg/dL 15 13 18   Creatinine 0.61 - 1.24 mg/dL 1.36(H) 1.35(H) 1.45(H)  Sodium 135 - 145 mmol/L 138 139 138  Potassium 3.5 - 5.1 mmol/L 4.1 3.9 3.9  Chloride 98 - 111 mmol/L 107 104 105  CO2 22 - 32 mmol/L 21(L) 24 24  Calcium 8.9 - 10.3 mg/dL 8.9 9.0 8.6(L)  Total Protein 6.5 - 8.1 g/dL - - -  Total Bilirubin 0.3 - 1.2 mg/dL - - -  Alkaline Phos 38 - 126 U/L - - -  AST 15 - 41 U/L - - -  ALT 0 - 44 U/L - - -    Imaging: No results found.   12/24/20 Echocardiogram IMPRESSIONS  1. Left ventricular ejection fraction, by estimation, is <20%. The left  ventricle has severely decreased function. The left ventricle demonstrates  global hypokinesis. The left ventricular internal cavity size was  moderately dilated. Left ventricular  diastolic parameters are consistent with Grade III diastolic dysfunction  (restrictive).  2. Right ventricular systolic function is moderately reduced. The right  ventricular size is normal. There is normal pulmonary artery systolic  pressure.  3. Left atrial size was severely dilated.  4. The mitral valve is normal in structure. Mild mitral valve  regurgitation. No evidence of mitral stenosis.  5. The aortic valve is tricuspid. Aortic valve  regurgitation is not  visualized. No aortic stenosis is present.  6. Aortic dilatation noted. There is mild dilatation of the ascending  aorta, measuring 38 mm.  7. The inferior vena cava is dilated in size with >50% respiratory  variability, suggesting right atrial pressure of 8 mmHg.   Assessment/Plan:   Active Problems:   Hypertensive urgency   Acute exacerbation of congestive heart failure ([place])  Patient Summary:  Matthew [name]. 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  This is hospital day 7.  Acute on Chronic Combined Systolic and Diastolic Heart Failure; now with worsening diastolic and biventricular heart failure Patient continues to appear euvolemic on examination, weight has reached plateau 87 kg. Suspect headache secondary to BiDill per patient's history above, however, this medication is known to cause headaches when initiating, with relief over continued use. Will continue PRN tylenol for his headaches. Patient is medically stable for D/C pending Psych placement. -Continue BiDil  -Continue entresto -Continue spironolactone and farxiga.  -Continue lasix 40 mg IV -I/O -3.L cumulative net  -strict I&O, daily weights  Severe Symptomatic Hypertension 2/2 Medication Non-Adherence Continues to be normotensive. Patient slightly hypotensive today, 106/70. Headache seems more consistent with BiDill than hypotension. Denies dizziness or lightheadedness.  -medications changes as per above -continue spironolactone   Depression Suicidal ideation Evaluated by psych. Recommend inpatient psych once medically cleared.  - Reconsulted psych who recommended inpatient psych facility -  Continue fluoxetine 30 mg daily - Suicide precautions: 1:1 sitter - Klonopin 0.25 mg qHS PRN  CKD Stage 2 sCr 1.36(baseline ~1.40-1.70)  Type II diabetes mellitus Prescribed glipizide 5 mg twice daily with meals. Reports non-adherence. Random BG 126 on arrival. Will hold home glipizide while in the hospital. - CBG monitoring - SSI   Hyperlipidemia - Continue home atorvastatin 80 mg daily - Continue home ezetimibe (Zetia) 10 mg daily   Diet: Heart Healthy VTE: Enoxaparin IVF: None Code: DNR  Dispo: Anticipated discharge to inpatient psych pending placement op   Please contact the on call pager after 5 pm and on weekends at [phone].  Sanjuana Letters DO  PGY-1 Internal Medicine Teaching Service Pager: [phone] 12/31/2020

## 2021-01-01 DIAGNOSIS — E785 Hyperlipidemia, unspecified: Secondary | ICD-10-CM

## 2021-01-01 DIAGNOSIS — N182 Chronic kidney disease, stage 2 (mild): Secondary | ICD-10-CM

## 2021-01-01 DIAGNOSIS — F32A Depression, unspecified: Secondary | ICD-10-CM

## 2021-01-01 MED ORDER — IBUPROFEN 400 MG PO TABS
400.0000 mg | ORAL_TABLET | Freq: Once | ORAL | Status: AC
  Administered 2021-01-01: 400 mg via ORAL
  Filled 2021-01-01: qty 1

## 2021-01-01 NOTE — Progress Notes (Signed)
HD#8 Subjective:   Overnight Events: None  Patient seen at bedside this AM. Patient states that he is doing okay this AM. He states that he knows waiting for psych placement is ultimately beneficial, but the wait is frustrating. He still has intermittent headaches. No other complaints at this time.   Objective:   Vital signs in last 24 hours: Vitals:   12/31/20 1207 12/31/20 1732 12/31/20 2356 01/01/21 0522  BP: 106/70 109/61 (!) 111/59 112/68  Pulse: (!) 55 63 63 60  Resp: 18 18 18 18   Temp: 97.9 F (36.6 C) 98.3 F (36.8 C) 98.4 F (36.9 C) 98.6 F (37 C)  TempSrc: Oral Oral Oral Oral  SpO2: 98% 99% 99% 99%  Weight:    87.4 kg  Height:       Supplemental O2: room air SpO2: 95 % O2 Flow Rate (L/min): 2 L/min FiO2 (%): 100 %  Physical Exam  Physical Exam Constitutional:      General: He is not in acute distress.    Appearance: He is normal weight. He is not ill-appearing.  Pulmonary:     Effort: Pulmonary effort is normal.     Breath sounds: Normal breath sounds. No decreased breath sounds, wheezing, rhonchi or rales.  Abdominal:     General: Bowel sounds are normal.     Palpations: Abdomen is soft. There is no mass.     Tenderness: There is no abdominal tenderness. There is no guarding.  Neurological:     Mental Status: He is alert and oriented to person, place, and time.     Filed Weights   12/30/20 0453 12/31/20 0125 01/01/21 0522  Weight: 87 kg 87.5 kg 87.4 kg    Intake/Output Summary (Last 24 hours) at 01/01/2021 0617 Last data filed at 12/31/2020 1715 Gross per 24 hour  Intake 980 ml  Output --  Net 980 ml   Net IO Since Admission: -2,552.11 mL [01/01/21 0617]  Pertinent Labs: CBC Latest Ref Rng & Units 12/26/2020 12/24/2020 12/24/2020  WBC 4.0 - 10.5 K/uL 5.4 - -  Hemoglobin 13.0 - 17.0 g/dL 15.6 15.3 16.3  Hematocrit 39.0 - 52.0 % 44.0 45.0 48.0  Platelets 150 - 400 K/uL 197 - -    CMP Latest Ref Rng & Units 12/30/2020 12/28/2020 12/27/2020   Glucose 70 - 99 mg/dL 100(H) 93 127(H)  BUN 6 - 20 mg/dL 15 13 18   Creatinine 0.61 - 1.24 mg/dL 1.36(H) 1.35(H) 1.45(H)  Sodium 135 - 145 mmol/L 138 139 138  Potassium 3.5 - 5.1 mmol/L 4.1 3.9 3.9  Chloride 98 - 111 mmol/L 107 104 105  CO2 22 - 32 mmol/L 21(L) 24 24  Calcium 8.9 - 10.3 mg/dL 8.9 9.0 8.6(L)  Total Protein 6.5 - 8.1 g/dL - - -  Total Bilirubin 0.3 - 1.2 mg/dL - - -  Alkaline Phos 38 - 126 U/L - - -  AST 15 - 41 U/L - - -  ALT 0 - 44 U/L - - -    Imaging: No results found.   12/24/20 Echocardiogram IMPRESSIONS  1. Left ventricular ejection fraction, by estimation, is <20%. The left  ventricle has severely decreased function. The left ventricle demonstrates  global hypokinesis. The left ventricular internal cavity size was  moderately dilated. Left ventricular  diastolic parameters are consistent with Grade III diastolic dysfunction  (restrictive).  2. Right ventricular systolic function is moderately reduced. The right  ventricular size is normal. There is normal pulmonary artery systolic  pressure.  3. Left atrial size was severely dilated.  4. The mitral valve is normal in structure. Mild mitral valve  regurgitation. No evidence of mitral stenosis.  5. The aortic valve is tricuspid. Aortic valve regurgitation is not  visualized. No aortic stenosis is present.  6. Aortic dilatation noted. There is mild dilatation of the ascending  aorta, measuring 38 mm.  7. The inferior vena cava is dilated in size with >50% respiratory  variability, suggesting right atrial pressure of 8 mmHg.   Assessment/Plan:   Active Problems:   Hypertensive urgency   Acute exacerbation of congestive heart failure ([place])  Patient Summary:  Matthew [name]. 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  This is hospital day 8.  Acute on Chronic Combined Systolic and Diastolic Heart Failure; now with worsening diastolic and biventricular heart failure Patient continues to appear euvolemic on examination, weight has reached plateau 87 kg. Suspect headache secondary to BiDill per patient's history above, however, this medication is known to cause headaches when initiating, with relief over continued use. Will continue PRN tylenol for his headaches. Patient is medically stable for D/C pending Psych placement. -Continue BiDil  -Continue entresto -Continue spironolactone and farxiga.  -Continue lasix 40 mg IV -strict I&O, daily weights  Severe Symptomatic Hypertension 2/2 Medication Non-Adherence Continues to be normotensive. Patient slightly hypotensive today, 106/70. Headache seems more consistent with BiDill than hypotension. Denies dizziness or lightheadedness.  -medications changes as per above -continue spironolactone   Depression Suicidal ideation Evaluated by psych. Recommend inpatient psych once medically cleared.  - Reconsulted psych who recommended inpatient psych facility - Continue fluoxetine 30 mg daily - Suicide precautions: 1:1 sitter - Klonopin 0.25 mg qHS PRN  CKD Stage 2 sCr 1.36 (baseline ~1.40-1.70)  Type II diabetes mellitus Prescribed glipizide 5 mg twice daily with meals. Reports non-adherence. Random BG 126 on arrival. Will hold home glipizide while in the hospital. - CBG monitoring - SSI   Hyperlipidemia - Continue home atorvastatin 80 mg daily - Continue home ezetimibe (Zetia) 10 mg daily   Diet: Heart Healthy VTE: Enoxaparin IVF: None Code: DNR  Dispo: Anticipated discharge to inpatient psych pending placement op   Please contact the on call pager after 5 pm and on weekends at [phone].  Maudie Mercury, MD PGY-2 Internal  Medicine Teaching Service Pager: [phone] 01/01/2021

## 2021-01-02 MED ORDER — IBUPROFEN 200 MG PO TABS
200.0000 mg | ORAL_TABLET | Freq: Once | ORAL | Status: AC
  Administered 2021-01-02: 200 mg via ORAL
  Filled 2021-01-02: qty 1

## 2021-01-02 MED ORDER — IBUPROFEN 400 MG PO TABS
400.0000 mg | ORAL_TABLET | Freq: Four times a day (QID) | ORAL | Status: DC | PRN
  Administered 2021-01-02 – 2021-01-03 (×2): 400 mg via ORAL
  Filled 2021-01-02 (×2): qty 1

## 2021-01-02 MED ORDER — IBUPROFEN 400 MG PO TABS
400.0000 mg | ORAL_TABLET | Freq: Once | ORAL | Status: AC
  Administered 2021-01-02: 400 mg via ORAL
  Filled 2021-01-02: qty 1

## 2021-01-02 NOTE — Progress Notes (Addendum)
Heart Failure Patient Advocate Encounter  Completed application for [employer] Patient Assistance Program sent in an effort to reduce the patient's out of pocket expense for Farxiga to $0.     Application completed and faxed to [phone].   Patient assistance phone number for follow up is [phone].   Completed application for [employer] Patient Assistance Program sent in an effort to reduce the patient's out of pocket expense for BiDil to $0.     Application completed and faxed to [phone].   Patient assistance phone number for follow up is [phone].   Completed application for [name] Patient Assistance Program sent in an effort to reduce the patient's out of pocket expense for Entresto to $0.     Application completed and faxed to [phone].   Patient assistance phone number for follow up is [phone].    Kerby Nora, PharmD, BCPS Heart Failure Stewardship Pharmacist Phone [phone]  Please check [email or web address] for unit-specific pharmacist phone numbers

## 2021-01-02 NOTE — Progress Notes (Addendum)
Heart Failure Stewardship Pharmacist Progress Note   PCP: Charlott Rakes, MD PCP-Cardiologist: No primary care provider on file.    HPI:  55 yo M with PMH of HTN, HLD, DM, CVA, and CHF. He presented to the ED for shortness of breath and noted he ran out of his medications. An ECHO was done on 12/24/20 and LVEF was <20% (was 40-45% in March 2021).  Current HF Medications: Furosemide 40 mg daily Carvedilol 25 mg BID Entresto 49/51 mg BID Spironolactone 25 mg daily Farxiga 10 mg daily BiDil 20/37.5 mg 2 tablets TID  Prior to admission HF Medications:    Not taking these medications Furosemide 40 mg daily Carvedilol 12.5 mg BID Lisinopril 5 mg daily Hydralazine 50 mg TID  Pertinent Lab Values (as of 1/14): Marland Kitchen Serum creatinine 1.36, BUN 15, Potassium 4.1, Sodium 138, BNP 647  Vital Signs: . Weight: 196 lbs (admission weight: 195 lbs) . Blood pressure: 110-150/80s  . Heart rate: 60s   Medication Assistance / Insurance Benefits Check: Does the patient have prescription insurance?  No  Does the patient qualify for medication assistance through manufacturers or grants?   Yes . Eligible grants and/or patient assistance programs: Lisabeth Register, BiDil . Medication assistance applications in progress: Lisabeth Register, BiDil  . Medication assistance applications approved: none Approved medication assistance renewals will be completed by: [place]:  Prior to admission outpatient pharmacy: [facility] Is the patient willing to use [facility] at discharge? Yes    Assessment: 1. Acute on chronic systolic CHF (EF <65%), due to NICM, medication noncompliance. NYHA class II symptoms. - Continue furosemide 40 mg daily - Continue carvedilol 25 mg BID - Continue Entresto 49/51 mg BID - Continue spironolactone 25 mg daily - Continue Farxiga 10 mg daily - Continue BiDil 20/37.5 mg 2 tabs TID    Plan: 1) Medication changes recommended at this  time: - Continue current regimen  - Order BMET for AM  2) Patient assistance application(s): - Delene Loll, Farxiga, and BiDil applications in process to bring copay down to $0 - HF Navigator following - Inpatient CSW and Financial Counselors to help with enrollment in Medicaid  3)  Education  - To be completed prior to discharge  Kerby Nora, PharmD, BCPS Heart Failure Cytogeneticist Phone [phone]

## 2021-01-02 NOTE — Progress Notes (Signed)
HD#9 Subjective:   Overnight Events: None  Patient seen at bedside this AM. He notes headaches have improved with ibuprofen. Also notes he does have left sided chest pain, does not radiate and is described as a "small pain." It is reproducible when I palpate the area, he denies shortness of breath. He continues to endorse frustration with waiting for psych placement and also voices concern over " who will pay for all of this." Patient proposed going home, but the team reinforced with firearms at the home we worried about his safety. He then stated he could have his brother-in-law remove the firearms from his house.   Objective:   Vital signs in last 24 hours: Vitals:   01/01/21 2322 01/02/21 0057 01/02/21 0429 01/02/21 0500  BP: 104/85   (!) 150/85  Pulse: 68   65  Resp: 18   18  Temp: 98.3 F (36.8 C)   98.2 F (36.8 C)  TempSrc: Oral   Oral  SpO2: 98%   99%  Weight:  89.3 kg 89.3 kg   Height:       Supplemental O2: room air SpO2: 95 % O2 Flow Rate (L/min): 2 L/min FiO2 (%): 100 %  Physical Exam  Physical Exam Constitutional:      General: He is not in acute distress.    Appearance: He is normal weight. He is not ill-appearing.  Pulmonary:     Effort: Pulmonary effort is normal.     Breath sounds: Normal breath sounds. No decreased breath sounds, wheezing, rhonchi or rales.  Abdominal:     General: Bowel sounds are normal.     Palpations: Abdomen is soft. There is no mass.     Tenderness: There is no abdominal tenderness. There is no guarding.  Musculoskeletal:     Comments: Chest wall tenderness with palpation  Neurological:     Mental Status: He is alert and oriented to person, place, and time.  Psychiatric:     Comments: Agitated. Poor eye contact. Inappropriate laughter.     Filed Weights   01/01/21 0522 01/02/21 0057 01/02/21 0429  Weight: 87.4 kg 89.3 kg 89.3 kg    Intake/Output Summary (Last 24 hours) at 01/02/2021 0730 Last data filed at 01/02/2021  0430 Gross per 24 hour  Intake --  Output 7 ml  Net -7 ml   Net IO Since Admission: -2,327.11 mL [01/02/21 0730]  Pertinent Labs: CBC Latest Ref Rng & Units 12/26/2020 12/24/2020 12/24/2020  WBC 4.0 - 10.5 K/uL 5.4 - -  Hemoglobin 13.0 - 17.0 g/dL 15.6 15.3 16.3  Hematocrit 39.0 - 52.0 % 44.0 45.0 48.0  Platelets 150 - 400 K/uL 197 - -    CMP Latest Ref Rng & Units 12/30/2020 12/28/2020 12/27/2020  Glucose 70 - 99 mg/dL 100(H) 93 127(H)  BUN 6 - 20 mg/dL 15 13 18   Creatinine 0.61 - 1.24 mg/dL 1.36(H) 1.35(H) 1.45(H)  Sodium 135 - 145 mmol/L 138 139 138  Potassium 3.5 - 5.1 mmol/L 4.1 3.9 3.9  Chloride 98 - 111 mmol/L 107 104 105  CO2 22 - 32 mmol/L 21(L) 24 24  Calcium 8.9 - 10.3 mg/dL 8.9 9.0 8.6(L)  Total Protein 6.5 - 8.1 g/dL - - -  Total Bilirubin 0.3 - 1.2 mg/dL - - -  Alkaline Phos 38 - 126 U/L - - -  AST 15 - 41 U/L - - -  ALT 0 - 44 U/L - - -    Imaging: No results found.  12/24/20 Echocardiogram IMPRESSIONS  1. Left ventricular ejection fraction, by estimation, is <20%. The left  ventricle has severely decreased function. The left ventricle demonstrates  global hypokinesis. The left ventricular internal cavity size was  moderately dilated. Left ventricular  diastolic parameters are consistent with Grade III diastolic dysfunction  (restrictive).  2. Right ventricular systolic function is moderately reduced. The right  ventricular size is normal. There is normal pulmonary artery systolic  pressure.  3. Left atrial size was severely dilated.  4. The mitral valve is normal in structure. Mild mitral valve  regurgitation. No evidence of mitral stenosis.  5. The aortic valve is tricuspid. Aortic valve regurgitation is not  visualized. No aortic stenosis is present.  6. Aortic dilatation noted. There is mild dilatation of the ascending  aorta, measuring 38 mm.  7. The inferior vena cava is dilated in size with >50% respiratory  variability, suggesting right  atrial pressure of 8 mmHg.   Assessment/Plan:   Active Problems:   Hypertensive urgency   Acute exacerbation of congestive heart failure ([place])  Patient Summary:  Matthew [name]. 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  This is hospital day 9.  Acute on Chronic Combined Systolic and Diastolic Heart Failure; now with worsening diastolic and biventricular heart failure Patient continues to appear euvolemic on examination, weight has reached plateau 87 kg. Headache thought to be secondary to starting bidil has improved with ibuprofen. Will continue PRN ibuprofen for his headaches. Patient is medically stable for D/C pending Psych placement. -Continue BiDil  -Continue entresto -Continue spironolactone and farxiga.  -Continue lasix 40 mg IV -strict I&O, daily weights  Severe Symptomatic Hypertension 2/2 Medication Non-Adherence Patient continues to be normotensive. -medications changes as per above -continue spironolactone   Depression Suicidal ideation Evaluated by psych. Recommend inpatient psych once medically cleared. Patient voiced frustration waiting for placement, discussed him this is why he was re-evaluated by psych who believed inpatient placement was best. He proposed removing the guns from his home which is certainty an option, would need to discuss further with psych. Will discuss where we are in the process with case management. - Reconsulted psych who recommended inpatient psych facility - Continue fluoxetine 30 mg daily - Suicide precautions: 1:1 sitter - Klonopin 0.25 mg qHS PRN  CKD Stage 2 sCr 1.36 (baseline ~1.40-1.70)  Type II diabetes mellitus Prescribed  glipizide 5 mg twice daily with meals. Reports non-adherence. Random BG 126 on arrival. Will hold home glipizide while in the hospital. - CBG monitoring - SSI   Hyperlipidemia - Continue home atorvastatin 80 mg daily - Continue home ezetimibe (Zetia) 10 mg daily   Diet: Heart Healthy VTE: Enoxaparin IVF: None Code: DNR  Dispo: Anticipated discharge to inpatient psych pending placement op   Please contact the on call pager after 5 pm and on weekends at [phone].  Maudie Mercury, MD PGY-2 Internal Medicine Teaching Service Pager: [phone] 01/02/2021

## 2021-01-03 ENCOUNTER — Encounter: Payer: Self-pay | Admitting: Behavioral Health

## 2021-01-03 ENCOUNTER — Other Ambulatory Visit (HOSPITAL_COMMUNITY): Payer: Self-pay | Admitting: Student

## 2021-01-03 ENCOUNTER — Inpatient Hospital Stay
Admission: AD | Admit: 2021-01-03 | Discharge: 2021-01-06 | DRG: 885 | Disposition: A | Discharge status: Home / Self Care | Payer: 59 | Source: Intra-hospital | Attending: Behavioral Health | Admitting: Behavioral Health

## 2021-01-03 ENCOUNTER — Other Ambulatory Visit: Payer: Self-pay

## 2021-01-03 DIAGNOSIS — G4700 Insomnia, unspecified: Secondary | ICD-10-CM | POA: Diagnosis present

## 2021-01-03 DIAGNOSIS — Z20822 Contact with and (suspected) exposure to covid-19: Secondary | ICD-10-CM | POA: Diagnosis present

## 2021-01-03 DIAGNOSIS — R41843 Psychomotor deficit: Secondary | ICD-10-CM | POA: Diagnosis present

## 2021-01-03 DIAGNOSIS — Z8249 Family history of ischemic heart disease and other diseases of the circulatory system: Secondary | ICD-10-CM

## 2021-01-03 DIAGNOSIS — R45851 Suicidal ideations: Secondary | ICD-10-CM | POA: Diagnosis present

## 2021-01-03 DIAGNOSIS — E119 Type 2 diabetes mellitus without complications: Secondary | ICD-10-CM

## 2021-01-03 DIAGNOSIS — E7800 Pure hypercholesterolemia, unspecified: Secondary | ICD-10-CM | POA: Diagnosis present

## 2021-01-03 DIAGNOSIS — Z8049 Family history of malignant neoplasm of other genital organs: Secondary | ICD-10-CM

## 2021-01-03 DIAGNOSIS — Z7982 Long term (current) use of aspirin: Secondary | ICD-10-CM

## 2021-01-03 DIAGNOSIS — I110 Hypertensive heart disease with heart failure: Secondary | ICD-10-CM | POA: Diagnosis present

## 2021-01-03 DIAGNOSIS — Z634 Disappearance and death of family member: Secondary | ICD-10-CM | POA: Diagnosis not present

## 2021-01-03 DIAGNOSIS — Z79899 Other long term (current) drug therapy: Secondary | ICD-10-CM

## 2021-01-03 DIAGNOSIS — F332 Major depressive disorder, recurrent severe without psychotic features: Secondary | ICD-10-CM | POA: Diagnosis present

## 2021-01-03 DIAGNOSIS — Z791 Long term (current) use of non-steroidal anti-inflammatories (NSAID): Secondary | ICD-10-CM

## 2021-01-03 DIAGNOSIS — I5042 Chronic combined systolic (congestive) and diastolic (congestive) heart failure: Secondary | ICD-10-CM | POA: Diagnosis present

## 2021-01-03 DIAGNOSIS — I429 Cardiomyopathy, unspecified: Secondary | ICD-10-CM | POA: Diagnosis present

## 2021-01-03 DIAGNOSIS — I10 Essential (primary) hypertension: Secondary | ICD-10-CM | POA: Diagnosis present

## 2021-01-03 DIAGNOSIS — E782 Mixed hyperlipidemia: Secondary | ICD-10-CM

## 2021-01-03 LAB — BASIC METABOLIC PANEL
Anion gap: 10 (ref 5–15)
BUN: 23 mg/dL — ABNORMAL HIGH (ref 6–20)
CO2: 21 mmol/L — ABNORMAL LOW (ref 22–32)
Calcium: 8.6 mg/dL — ABNORMAL LOW (ref 8.9–10.3)
Chloride: 106 mmol/L (ref 98–111)
Creatinine, Ser: 1.44 mg/dL — ABNORMAL HIGH (ref 0.61–1.24)
GFR, Estimated: 58 mL/min — ABNORMAL LOW (ref >60)
Glucose, Bld: 95 mg/dL (ref 70–99)
Potassium: 4.1 mmol/L (ref 3.5–5.1)
Sodium: 137 mmol/L (ref 135–145)

## 2021-01-03 LAB — RESP PANEL BY RT-PCR (FLU A&B, COVID) ARPGX2
Influenza A by PCR: NEGATIVE
Influenza B by PCR: NEGATIVE
SARS Coronavirus 2 by RT PCR: NEGATIVE

## 2021-01-03 MED ORDER — EZETIMIBE 10 MG PO TABS
10.0000 mg | ORAL_TABLET | Freq: Every day | ORAL | Status: DC
  Administered 2021-01-04 – 2021-01-06 (×3): 10 mg via ORAL
  Filled 2021-01-03 (×3): qty 1

## 2021-01-03 MED ORDER — MAGNESIUM HYDROXIDE 400 MG/5ML PO SUSP: 30.0000 mL | Freq: Every day | ORAL | Status: DC | PRN

## 2021-01-03 MED ORDER — SPIRONOLACTONE 25 MG PO TABS: 25.0000 mg | ORAL_TABLET | Freq: Every day | ORAL | Status: DC

## 2021-01-03 MED ORDER — SACUBITRIL-VALSARTAN 49-51 MG PO TABS: 1.0000 | ORAL_TABLET | Freq: Two times a day (BID) | ORAL | Status: DC

## 2021-01-03 MED ORDER — CLONAZEPAM 0.5 MG PO TABS
0.5000 mg | ORAL_TABLET | Freq: Every evening | ORAL | Status: DC | PRN
  Administered 2021-01-04 – 2021-01-05 (×2): 0.5 mg via ORAL
  Filled 2021-01-03 (×2): qty 1

## 2021-01-03 MED ORDER — DAPAGLIFLOZIN PROPANEDIOL 10 MG PO TABS: 10.0000 mg | ORAL_TABLET | Freq: Every day | ORAL | Status: DC

## 2021-01-03 MED ORDER — CARVEDILOL 12.5 MG PO TABS
25.0000 mg | ORAL_TABLET | Freq: Two times a day (BID) | ORAL | Status: DC
  Administered 2021-01-04 – 2021-01-06 (×5): 25 mg via ORAL
  Filled 2021-01-03 (×5): qty 2

## 2021-01-03 MED ORDER — ALUM & MAG HYDROXIDE-SIMETH 200-200-20 MG/5ML PO SUSP: 30.0000 mL | ORAL | Status: DC | PRN

## 2021-01-03 MED ORDER — IBUPROFEN 400 MG PO TABS: 400.0000 mg | ORAL_TABLET | Freq: Four times a day (QID) | ORAL | 0 refills | Status: DC | PRN

## 2021-01-03 MED ORDER — POLYETHYLENE GLYCOL 3350 17 G PO PACK: 17.0000 g | PACK | Freq: Every day | ORAL | Status: DC | PRN

## 2021-01-03 MED ORDER — TIMOLOL MALEATE 0.5 % OP SOLN
1.0000 [drp] | Freq: Every day | OPHTHALMIC | Status: DC
  Administered 2021-01-04 – 2021-01-06 (×3): 1 [drp] via OPHTHALMIC
  Filled 2021-01-03: qty 5

## 2021-01-03 MED ORDER — FLUOXETINE HCL 10 MG PO CAPS: 30.0000 mg | ORAL_CAPSULE | Freq: Every day | ORAL | 3 refills | Status: DC

## 2021-01-03 MED ORDER — SPIRONOLACTONE 25 MG PO TABS
25.0000 mg | ORAL_TABLET | Freq: Every day | ORAL | Status: DC
  Administered 2021-01-04 – 2021-01-06 (×3): 25 mg via ORAL
  Filled 2021-01-03 (×3): qty 1

## 2021-01-03 MED ORDER — ISOSORB DINITRATE-HYDRALAZINE 20-37.5 MG PO TABS
2.0000 | ORAL_TABLET | Freq: Three times a day (TID) | ORAL | Status: DC
  Administered 2021-01-04 (×2): 2 via ORAL
  Filled 2021-01-03 (×3): qty 2

## 2021-01-03 MED ORDER — FUROSEMIDE 40 MG PO TABS
40.0000 mg | ORAL_TABLET | Freq: Every day | ORAL | Status: DC
  Administered 2021-01-04 – 2021-01-06 (×3): 40 mg via ORAL
  Filled 2021-01-03 (×3): qty 1

## 2021-01-03 MED ORDER — ISOSORB DINITRATE-HYDRALAZINE 20-37.5 MG PO TABS: 2.0000 | ORAL_TABLET | Freq: Three times a day (TID) | ORAL | Status: DC

## 2021-01-03 MED ORDER — FLUOXETINE HCL 20 MG PO CAPS
30.0000 mg | ORAL_CAPSULE | Freq: Every day | ORAL | Status: DC
  Administered 2021-01-04: 30 mg via ORAL
  Filled 2021-01-03: qty 1

## 2021-01-03 MED ORDER — SACUBITRIL-VALSARTAN 49-51 MG PO TABS
1.0000 | ORAL_TABLET | Freq: Two times a day (BID) | ORAL | Status: DC
  Administered 2021-01-04 – 2021-01-06 (×5): 1 via ORAL
  Filled 2021-01-03 (×6): qty 1

## 2021-01-03 MED ORDER — CLONAZEPAM 0.5 MG PO TABS: 0.5000 mg | ORAL_TABLET | Freq: Every evening | ORAL | 0 refills | Status: DC | PRN

## 2021-01-03 MED ORDER — CARVEDILOL 25 MG PO TABS: 25.0000 mg | ORAL_TABLET | Freq: Two times a day (BID) | ORAL | Status: DC

## 2021-01-03 MED ORDER — ATORVASTATIN CALCIUM 20 MG PO TABS
80.0000 mg | ORAL_TABLET | Freq: Every day | ORAL | Status: DC
  Administered 2021-01-04 – 2021-01-06 (×3): 80 mg via ORAL
  Filled 2021-01-03 (×3): qty 4

## 2021-01-03 MED ORDER — ACETAMINOPHEN 650 MG RE SUPP
650.0000 mg | Freq: Four times a day (QID) | RECTAL | Status: DC | PRN
  Filled 2021-01-03: qty 1

## 2021-01-03 MED ORDER — ACETAMINOPHEN 325 MG PO TABS: 650.0000 mg | ORAL_TABLET | Freq: Four times a day (QID) | ORAL | Status: DC | PRN

## 2021-01-03 MED ORDER — IBUPROFEN 200 MG PO TABS
400.0000 mg | ORAL_TABLET | Freq: Four times a day (QID) | ORAL | Status: DC | PRN
  Administered 2021-01-04 – 2021-01-05 (×3): 400 mg via ORAL
  Filled 2021-01-03 (×3): qty 2

## 2021-01-03 MED ORDER — DAPAGLIFLOZIN PROPANEDIOL 5 MG PO TABS
10.0000 mg | ORAL_TABLET | Freq: Every day | ORAL | Status: DC
  Administered 2021-01-04 – 2021-01-06 (×3): 10 mg via ORAL
  Filled 2021-01-03 (×3): qty 2

## 2021-01-03 NOTE — Progress Notes (Signed)
Patient admitted from [facility], report received from [place], [place]. Patient irritable upon assessment. Patient denies SI/HI/AVH, endorses depression. Patient minimal with staff stating, "I am tired and just want to get some sleep." Skin check completed with Ric, no abnormalities found. No contraband found on patient. Patient given education, support, and encouragement to be active in his treatment plan. Patient oriented to the unit and his room. Patient being monitored Q 15 minutes for safety per unit protocol. Pt remains safe on the unit.

## 2021-01-03 NOTE — TOC Transition Note (Signed)
Transition of Care [facility]) - CM/SW Discharge Note   Patient Details  Name: Matthew [name]. MRN: [id number] Date of Birth: Dec 15, 1966  Transition of Care [facility]) CM/SW Contact:  Matthew [name], [name] Phone Number: 01/03/2021, 4:20 PM   Clinical Narrative:     PT will discharge to [facility]. Pts covid results are pending. Pt states he will notify famly. Pt has signed voluntary commitment and rider waiver, forms have been placed on chart and a copy with his D/C paperwork.   [facility] can accept pt after 8:30p,. Nurse will need to call safe transport after hours at [phone] at about 8:15pm or after covid results have been received.  Nurse to call report to [phone].   Final next level of care: Psychiatric Hospital Barriers to Discharge: Barriers Resolved   Patient Goals and CMS Choice        Discharge Placement              Patient chooses bed at: Other - please specify in the comment section below: ([facility]) Patient to be transferred to facility by: Margorie John safe transport Name of family member notified: Pt will notify family Patient and family notified of of transfer: 01/03/21  Discharge Plan and Services In-house Referral: Clinical Social Work                                   Social Determinants of Health ([place]) Interventions     Readmission Risk Interventions No flowsheet data found.  Matthew [name], Latanya Presser, [place] Social Worker [phone]

## 2021-01-03 NOTE — Discharge Instructions (Signed)
Thank you for allowing [place] to assist in your care Matthew [name]! You were admitted for heart failure, the cardiologist worked with [place] to optimize your medications. There was also concern over your psychiatric medications and we will be discharging you to a psychiatric facility to make sure you are on the best medications for you.

## 2021-01-03 NOTE — Progress Notes (Signed)
Heart Failure Stewardship Pharmacist Progress Note   PCP: Charlott Rakes, MD PCP-Cardiologist: No primary care provider on file.    HPI:  55 yo M with PMH of HTN, HLD, DM, CVA, and CHF. He presented to the ED for shortness of breath and noted he ran out of his medications. An ECHO was done on 12/24/20 and LVEF was <20% (was 40-45% in March 2021).  Current HF Medications: Furosemide 40 mg daily Carvedilol 25 mg BID Entresto 49/51 mg BID Spironolactone 25 mg daily Farxiga 10 mg daily BiDil 20/37.5 mg 2 tablets TID  Prior to admission HF Medications:    Not taking these medications Furosemide 40 mg daily Carvedilol 12.5 mg BID Lisinopril 5 mg daily Hydralazine 50 mg TID  Pertinent Lab Values: . Serum creatinine 1.44, BUN 23, Potassium 4.1, Sodium 137, BNP 647  Vital Signs: . Weight: 194 lbs (admission weight: 195 lbs) . Blood pressure: 110-120/70s  . Heart rate: 50s   Medication Assistance / Insurance Benefits Check: Does the patient have prescription insurance?  No  Does the patient qualify for medication assistance through manufacturers or grants?   Yes . Eligible grants and/or patient assistance programs: Lisabeth Register, BiDil . Medication assistance applications in progress: Lisabeth Register, BiDil  . Medication assistance applications approved: none Approved medication assistance renewals will be completed by: [place]:  Prior to admission outpatient [facility] Is the patient willing to use [facility] at discharge? Yes    Assessment: 1. Acute on chronic systolic CHF (EF <23%), due to NICM, medication noncompliance. NYHA class II symptoms. - Continue furosemide 40 mg daily - Continue carvedilol 25 mg BID - Continue Entresto 49/51 mg BID - Continue spironolactone 25 mg daily - Continue Farxiga 10 mg daily - Continue BiDil 20/37.5 mg 2 tabs TID    Plan: 1) Medication changes recommended at this time: -  Continue current regimen   2) Patient assistance application(s): - Delene Loll, Farxiga, and BiDil applications in process to bring copay down to $0 - HF Navigator following - Inpatient CSW and Financial Counselors to help with enrollment in Medicaid  3)  Education  - To be completed prior to discharge  Kerby Nora, PharmD, BCPS Heart Failure Cytogeneticist Phone [phone]

## 2021-01-03 NOTE — Tx Team (Signed)
Initial Treatment Plan 01/03/2021 9:38 PM Matthew [name]. CBU:[id number]    PATIENT STRESSORS: Health problems Medication change or noncompliance   PATIENT STRENGTHS: Ability for insight Motivation for treatment/growth   PATIENT IDENTIFIED PROBLEMS: Suicidal Ideation  Depression                   DISCHARGE CRITERIA:  Adequate post-discharge living arrangements Verbal commitment to aftercare and medication compliance  PRELIMINARY DISCHARGE PLAN: Outpatient therapy Return to previous living arrangement  PATIENT/FAMILY INVOLVEMENT: This treatment plan has been presented to and reviewed with the patient, Matthew [name].. The patient has been given the opportunity to ask questions and make suggestions.  Mallie Darting, RN 01/03/2021, 9:38 PM

## 2021-01-03 NOTE — Plan of Care (Signed)
Patient new to the unit tonight, hasn't had time to progress  Problem: Education: Goal: Knowledge of [facility] Education information/materials will improve Outcome: Not Progressing Goal: Emotional status will improve Outcome: Not Progressing Goal: Mental status will improve Outcome: Not Progressing Goal: Verbalization of understanding the information provided will improve Outcome: Not Progressing   Problem: Safety: Goal: Periods of time without injury will increase Outcome: Not Progressing   Problem: Education: Goal: Ability to make informed decisions regarding treatment will improve Outcome: Not Progressing   Problem: Medication: Goal: Compliance with prescribed medication regimen will improve Outcome: Not Progressing

## 2021-01-03 NOTE — Consult Note (Signed)
[place] Psychiatry Consult   Reason for Consult:  Suicidal ideations with a plan Referring Physician:  Dr. Philipp Ovens Patient Identification: Matthew [name]. MRN:  [id number] Principal Diagnosis: <principal problem not specified> Diagnosis:  Active Problems:   Hypertensive urgency   Acute exacerbation of congestive heart failure ([place])   Total Time spent with patient: 30 minutes  Subjective:   Matthew Lacasse. is a 55 y.o. male patient admitted with shortness of breath. Psych consult placed for suicidal ideations with a plan. Patient is observed lying in bed, and acknowledges writer upon entry into the room. He remains pleasant and cooperative.  He continues to agree to inpatient treatment, and offers some insight at this time into his level of depression. He acknowledges that his depression has impacted his physical health. He continues to agree to inpatient. Patient does admit to depressive symptoms and appears to be motivated to to seek appropriate treatment. He denies suicidal ideations at this current time, and is also able to contract for safety.  However he does admit to worsening depression, suicidal thoughts, and access to weapons.    Patient with worsening depression and now endorsing suicidal thoughts with a plan, with intentions and access to weapons. He has a high risk to complete suicide, also must consider the upcoming anniversary of his wife's death.  He reports that his brother in law was able to remove all his guns from the home, despite having a safer disposition he still expresses interest in inpatient psychiatric admission.   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  The patient is vague with his report of his symptoms and timeline. The patient states he was in his usual state of health until yesterday when he began to "feel bad" and became short of breath. The shortness of breath improves with laying flat and worsens with exertion. Associated with chest pressure which he reports started yesterday evening and radiates down his right arm, however he attributes it to hunger. Endorses a left-sided headache with onset earlier today. States his vision is blurry bilaterally chronically. Denies nausea, vomiting, lower extremity swelling,  dysuria. Reports he ran out of most of his medications ~1 week ago and wasn't able to get a ride to pick up refills. Reports intermittent adherence to his medications since his wife died suddenly while he was hospitalized in 02/2020. Endorses low energy, poor sleep, guilt surrounding his wife's death since he wasn't with her when she died, and thoughts of hurting himself. With regard to his suicidal ideation, he states he has plans but has not had attempts, has guns in every room of his house. When asked what has kept him from acting on his plans, he states he didn't want to ruin his family's holiday. Reports since his wife died, he has not seen his family as much.   Admitted 07/04/20-07/06/20 with acute hypoxic respiratory failure secondary to acute CHF and hypertensive urgency also in the setting of poor adherence of his medications.  Past Psychiatric History: Denies  Risk to Self:  Yes Risk to Others:  NO Prior Inpatient Therapy:  Denies Prior Outpatient Therapy:  Denies  Past Medical History:  Past Medical History:  Diagnosis Date  . Acute  combined systolic and diastolic CHF, NYHA class 3 ([place])    a. 02/2014 Echo: EF 25-30%.  . Cardiomyopathy ([place])    a. 02/2014 Echo: EF 25-30%, sev glob HK with inferolat HK->AK, mod conc LVH, Gr 2 DD, Mild MR, sev dil LA.  . Depression   . DM (diabetes mellitus)  ([place])   . High cholesterol   . Hypertension   . Morbid obesity ([place])   . Tobacco abuse     Past Surgical History:  Procedure Laterality Date  . CATARACT EXTRACTION Right   . LEFT HEART CATHETERIZATION WITH CORONARY ANGIOGRAM N/A 03/04/2014   Procedure: LEFT HEART CATHETERIZATION WITH CORONARY ANGIOGRAM;  Surgeon: Burnell Blanks, MD;  Location: [facility] CATH LAB;  Service: Cardiovascular;  Laterality: N/A;  . TEE WITHOUT CARDIOVERSION N/A 02/04/2017   Procedure: TRANSESOPHAGEAL ECHOCARDIOGRAM (TEE);  Surgeon: Dorothy Spark, MD;  Location: [facility] ENDOSCOPY;  Service: Cardiovascular;  Laterality: N/A;   Family History:  Family History  Problem Relation Age of Onset  . Heart attack Father        died @ 13  . Cervical cancer Mother        died @ 70   Family Psychiatric  History: Denies Social History:  Social History   Substance and Sexual Activity  Alcohol Use No     Social History   Substance and Sexual Activity  Drug Use No    Social History   Socioeconomic History  . Marital status: Widowed    Spouse name: Not on file  . Number of children: 2  . Years of education: 102  . Highest education level: Not on file  Occupational History  . Occupation: [employer]  Tobacco Use  . Smoking status: Current Some Day Smoker    Years: 20.00    Types: Cigarettes  . Smokeless tobacco: Never Used  Vaping Use  . Vaping Use: Never used  Substance and Sexual Activity  . Alcohol use: No  . Drug use: No  . Sexual activity: Yes  Other Topics Concern  . Not on file  Social History Narrative   Lives with 2 young children in [place].  Wife died in 05-07-2020.  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   Left-handed   Caffeine: occasional tea   Social Determinants of Radio broadcast assistant Strain: Not on file  Food Insecurity: Not on file  Transportation Needs: Not on file  Physical Activity: Not on file  Stress: Not on file  Social Connections: Not on  file   Additional Social History:    Allergies:  No Known Allergies  Labs:  Results for orders placed or performed during the hospital encounter of 12/24/20 (from the past 48 hour(s))  Basic metabolic panel     Status: Abnormal   Collection Time: 01/03/21 12:55 AM  Result Value Ref Range   Sodium 137 135 - 145 mmol/L   Potassium 4.1 3.5 - 5.1 mmol/L   Chloride 106 98 - 111 mmol/L   CO2 21 (L) 22 - 32 mmol/L   Glucose, Bld 95 70 - 99 mg/dL    Comment: Glucose reference range applies only to samples taken after fasting for at least 8 hours.   BUN 23 (H) 6 - 20 mg/dL   Creatinine, Ser 1.44 (H) 0.61 - 1.24 mg/dL   Calcium 8.6 (L) 8.9 - 10.3 mg/dL   GFR, Estimated 58 (L) >60 mL/min    Comment: (NOTE) Calculated using the CKD-EPI  Creatinine Equation (2021)    Anion gap 10 5 - 15    Comment: Performed at [facility], [place] [address] [place], [place] [address]    Current Facility-Administered Medications  Medication Dose Route Frequency Provider Last Rate Last Admin  . acetaminophen (TYLENOL) tablet 650 mg  650 mg Oral Q6H PRN Maudie Mercury, MD   650 mg at 01/02/21 0217   Or  . acetaminophen (TYLENOL) suppository 650 mg  650 mg Rectal Q6H PRN Maudie Mercury, MD      . atorvastatin (LIPITOR) tablet 80 mg  80 mg Oral Daily Maudie Mercury, MD   80 mg at 01/03/21 0902  . carvedilol (COREG) tablet 25 mg  25 mg Oral BID WC Alexandria Lodge, MD   25 mg at 01/03/21 0902  . clonazePAM (KLONOPIN) tablet 0.5 mg  0.5 mg Oral QHS PRN Velna Ochs, MD   0.5 mg at 01/02/21 2140  . dapagliflozin propanediol (FARXIGA) tablet 10 mg  10 mg Oral Daily Reino Bellis B, NP   10 mg at 01/03/21 0901  . enoxaparin (LOVENOX) injection 40 mg  40 mg Subcutaneous Q24H Maudie Mercury, MD   40 mg at 01/03/21 1108  . ezetimibe (ZETIA) tablet 10 mg  10 mg Oral Daily Maudie Mercury, MD   10 mg at 01/03/21 0902  . FLUoxetine (PROZAC) capsule 30 mg  30 mg Oral Daily Suella Broad, FNP   30 mg  at 01/03/21 0901  . furosemide (LASIX) tablet 40 mg  40 mg Oral Daily Kris Mouton, RPH   40 mg at 01/03/21 [id number]  . ibuprofen (ADVIL) tablet 400 mg  400 mg Oral Q6H PRN Joni Reining C, DO   400 mg at 01/03/21 0909  . isosorbide-hydrALAZINE (BIDIL) 20-37.5 MG per tablet 2 tablet  2 tablet Oral TID Cheryln Manly, NP   2 tablet at 01/03/21 0902  . polyethylene glycol (MIRALAX / GLYCOLAX) packet 17 g  17 g Oral Daily PRN Maudie Mercury, MD      . sacubitril-valsartan (ENTRESTO) 49-51 mg per tablet  1 tablet Oral BID Reino Bellis B, NP   1 tablet at 01/03/21 0901  . spironolactone (ALDACTONE) tablet 25 mg  25 mg Oral Daily Reino Bellis B, NP   25 mg at 01/03/21 0902  . timolol (TIMOPTIC) 0.5 % ophthalmic solution 1 drop  1 drop Both Eyes Daily Reino Bellis B, NP   1 drop at 01/03/21 0900    Musculoskeletal: Strength & Muscle Tone: Gait & Station: normal Patient leans: N/A  Psychiatric Specialty Exam: Physical Exam   Review of Systems   Blood pressure (!) 152/83, pulse 81, temperature 99.2 F (37.3 C), temperature source Oral, resp. rate 18, height 5\' 9"  (1.753 m), weight 88 kg, SpO2 100 %.Body mass index is 28.65 kg/m.  General Appearance: Fairly Groomed  Eye Contact:  Fair  Speech:  Clear and Coherent and Normal Rate  Volume:  Normal  Mood:  Anxious and Depressive smiling  Affect:  Appropriate and Congruent  Thought Process:  Coherent, Linear and Descriptions of Associations: Intact  Orientation:  Full (Time, Place, and Person)  Thought Content:  Logical  Suicidal Thoughts:  No    Homicidal Thoughts:  No  Memory:  Immediate;   Fair Recent;   Fair  Judgement:  Poor  Insight:  Shallow  Psychomotor Activity:  Normal  Concentration:  Concentration: Fair and Attention Span: Fair  Recall:  [employer] of Knowledge:  Fair  Language:  Fair  Akathisia:  NA  Handed:  Right  AIMS (if indicated):     Assets:  Communication Skills Desire for Improvement Financial  Resources/Insurance Leisure Time Physical Health  ADL's:  Intact  Cognition:  WNL  Sleep:        Treatment Plan Summary: No changes to recommendations below.  We will continue to recommend inpatient at this time, patient is now in agreement for voluntary admission after citing he has high risk factors for suicide. Plan Increase Prozac 30mg  po daily, recently started taking this medication. Will recommend working closely with SW to get him into an inpatient psych hospital. Patient has a history of leaving AMA. Will need to be placed under IVC if he attempts to leave.  -May DC safety sitter as patient is able to contract for safety and remains voluntary.  - Patient remains voluntary and shows motivation to seek treatment.  -Recommend working closely with SW to facilitate inpatient admission to psych hospital. Patient awaiting psych bed x 7 days, please refer out of system. Patient with limited resources to technology therefore is not a good candidate for PHP/IOP at this time.  -Will continue Prozac 30mg  po daily for depression.   Disposition: Recommend psychiatric Inpatient admission when medically cleared.  Suella Broad, FNP 01/03/2021 2:16 PM

## 2021-01-03 NOTE — Progress Notes (Signed)
HD#10 Subjective:   Overnight Events: None  Patient seen and evaluated at bedside. He states that his headache is better controlled with ibuprofen. His cousin removed all of the guns in his house and have hidden them. He would like to reach out to psychiatry to discuss this new development. No complaints this AM.   Objective:   Vital signs in last 24 hours: Vitals:   01/02/21 1158 01/02/21 1758 01/02/21 2201 01/03/21 0545  BP: 119/78 (!) 101/55 112/78 127/77  Pulse: (!) 56 61 (!) 51 (!) 54  Resp: 16  18 18   Temp: 97.9 F (36.6 C) (!) 97.5 F (36.4 C) 97.7 F (36.5 C) 98.9 F (37.2 C)  TempSrc: Oral Oral Oral Oral  SpO2: 96% 100% 99% 97%  Weight:    88 kg  Height:       Supplemental O2: room air SpO2: 95 % O2 Flow Rate (L/min): 2 L/min FiO2 (%): 100 %  Physical Exam  Physical Exam HENT:     Head: Normocephalic and atraumatic.  Cardiovascular:     Rate and Rhythm: Normal rate and regular rhythm.     Pulses: Normal pulses.     Heart sounds: Normal heart sounds. No murmur heard. No friction rub. No gallop.   Pulmonary:     Effort: Pulmonary effort is normal.     Breath sounds: Normal breath sounds. No decreased breath sounds, wheezing, rhonchi or rales.  Musculoskeletal:     Right lower leg: No tenderness. No edema.     Left lower leg: No tenderness. No edema.  Neurological:     Mental Status: He is alert and oriented to person, place, and time.     Filed Weights   01/02/21 0057 01/02/21 0429 01/03/21 0545  Weight: 89.3 kg 89.3 kg 88 kg    Intake/Output Summary (Last 24 hours) at 01/03/2021 6283 Last data filed at 01/03/2021 0201 Gross per 24 hour  Intake 720 ml  Output --  Net 720 ml   Net IO Since Admission: -1,607.11 mL [01/03/21 0605]  Pertinent Labs: CBC Latest Ref Rng & Units 12/26/2020 12/24/2020 12/24/2020  WBC 4.0 - 10.5 K/uL 5.4 - -  Hemoglobin 13.0 - 17.0 g/dL 15.6 15.3 16.3  Hematocrit 39.0 - 52.0 % 44.0 45.0 48.0  Platelets 150 - 400 K/uL  197 - -    CMP Latest Ref Rng & Units 01/03/2021 12/30/2020 12/28/2020  Glucose 70 - 99 mg/dL 95 100(H) 93  BUN 6 - 20 mg/dL 23(H) 15 13  Creatinine 0.61 - 1.24 mg/dL 1.44(H) 1.36(H) 1.35(H)  Sodium 135 - 145 mmol/L 137 138 139  Potassium 3.5 - 5.1 mmol/L 4.1 4.1 3.9  Chloride 98 - 111 mmol/L 106 107 104  CO2 22 - 32 mmol/L 21(L) 21(L) 24  Calcium 8.9 - 10.3 mg/dL 8.6(L) 8.9 9.0  Total Protein 6.5 - 8.1 g/dL - - -  Total Bilirubin 0.3 - 1.2 mg/dL - - -  Alkaline Phos 38 - 126 U/L - - -  AST 15 - 41 U/L - - -  ALT 0 - 44 U/L - - -    Imaging: No results found.   12/24/20 Echocardiogram IMPRESSIONS  1. Left ventricular ejection fraction, by estimation, is <20%. The left  ventricle has severely decreased function. The left ventricle demonstrates  global hypokinesis. The left ventricular internal cavity size was  moderately dilated. Left ventricular  diastolic parameters are consistent with Grade III diastolic dysfunction  (restrictive).  2. Right ventricular systolic function  is moderately reduced. The right  ventricular size is normal. There is normal pulmonary artery systolic  pressure.  3. Left atrial size was severely dilated.  4. The mitral valve is normal in structure. Mild mitral valve  regurgitation. No evidence of mitral stenosis.  5. The aortic valve is tricuspid. Aortic valve regurgitation is not  visualized. No aortic stenosis is present.  6. Aortic dilatation noted. There is mild dilatation of the ascending  aorta, measuring 38 mm.  7. The inferior vena cava is dilated in size with >50% respiratory  variability, suggesting right atrial pressure of 8 mmHg.   Assessment/Plan:   Active Problems:   Hypertensive urgency   Acute exacerbation of congestive heart failure ([place])  Patient Summary:  Matthew [name]. is a 55 year old man with history of HFmrEF (02/15/20 Echo EF 40-45%), hypertension, hyperlipidemia, non-insulin dependent type 2 diabetes, CVA who  presents with shortness of breath after running out of his medications ~1 week ago and admitted for further evaluation and management of hypertensive emergency and acute on chronic heart failure.  This is hospital day 10.  Acute on Chronic Combined Systolic and Diastolic Heart Failure; now with worsening diastolic and biventricular heart failure Patient continues to appear euvolemic on examination, weight has increased passed few days, 89 and 88 kg from 87. Headache thought to be secondary to starting bidil has improved with ibuprofen. Will continue PRN ibuprofen for his headaches. Patient is medically stable for D/C pending Psych placement. -Continue BiDil  -Continue entresto -Continue spironolactone and farxiga.  -Continue lasix 40 mg IV -strict I&O, daily weights  Severe Symptomatic Hypertension 2/2 Medication Non-Adherence Patient continues to be normotensive. -medications changes as per above -continue spironolactone   Depression Suicidal ideation Evaluated by psych. Recommend inpatient psych once medically cleared. Patient voiced frustration waiting for placement, discussed him this is why he was re-evaluated by psych who believed inpatient placement was best. He has had a family member go to his home and remove all firearms, would like to reconsult psych.  - Reconsulted psych who recommended inpatient psych facility - Continue fluoxetine 30 mg daily - Suicide precautions: 1:1 sitter - Klonopin 0.25 mg qHS PRN  CKD Stage 2 sCr 1.36 (baseline ~1.40-1.70)  Type II diabetes mellitus Prescribed glipizide 5 mg twice daily with meals. Reports non-adherence. Random BG 126 on arrival. Will hold home glipizide while in the hospital. - CBG monitoring - SSI   Hyperlipidemia - Continue home atorvastatin 80 mg daily - Continue home ezetimibe (Zetia) 10 mg daily  Headache Likely 2/2 to Medication Side Effect:  Patient states that his headache worsens after taking Lasix, discussed  switching to Torsemide to see if it alleviates his headache. Patient states headache is well tolerated with Ibuprofen. Patient would like to DC tylenol.  - DC Tylenol - Ibuprofen 200 mg PRN Q6H     Diet: Heart Healthy VTE: Enoxaparin IVF: None Code: DNR  Dispo: Anticipated discharge to inpatient psych pending placement op   Please contact the on call pager after 5 pm and on weekends at [phone].  Maudie Mercury, MD PGY-2 Internal Medicine Teaching Service Pager: [phone] 01/03/2021

## 2021-01-03 NOTE — BH Assessment (Addendum)
Patient can come after 8:30pm pending negative COVID  Please be sure to discharge pt before arrival   Patient has been accepted to [facility].  Accepting physician is Dr. Domingo Cocking.  Attending Physician will be Dr. Domingo Cocking.  Patient has been assigned to room 322, by [place].   Call report to [phone].  Representative/Transfer Coordinator is Keri Tavella  Patient pre-admitted by [facility] Patient Access [facility])  [facility] ER Staff Sheran Fava, FNP & Kandis Nab., LCSWA ) made aware of acceptance.

## 2021-01-04 DIAGNOSIS — F332 Major depressive disorder, recurrent severe without psychotic features: Secondary | ICD-10-CM

## 2021-01-04 MED ORDER — ISOSORB DINITRATE-HYDRALAZINE 20-37.5 MG PO TABS
2.0000 | ORAL_TABLET | Freq: Three times a day (TID) | ORAL | Status: DC
  Administered 2021-01-04 – 2021-01-06 (×4): 2 via ORAL
  Filled 2021-01-04 (×7): qty 2

## 2021-01-04 MED ORDER — FLUOXETINE HCL 20 MG PO CAPS
40.0000 mg | ORAL_CAPSULE | Freq: Every day | ORAL | Status: DC
  Administered 2021-01-05 – 2021-01-06 (×2): 40 mg via ORAL
  Filled 2021-01-04 (×2): qty 2
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
## 2021-01-04 NOTE — Tx Team (Addendum)
Interdisciplinary Treatment and Diagnostic Plan Update  01/04/2021 Time of Session: 9:00AM Matthew [name]. MRN: [id number]  Principal Diagnosis: <principal problem not specified>  Secondary Diagnoses: Active Problems:   MDD (major depressive disorder), recurrent severe, without psychosis ([place])   Current Medications:  Current Facility-Administered Medications  Medication Dose Route Frequency Provider Last Rate Last Admin  . acetaminophen (TYLENOL) tablet 650 mg  650 mg Oral Q6H PRN Salley Scarlet, MD       Or  . acetaminophen (TYLENOL) suppository 650 mg  650 mg Rectal Q6H PRN Salley Scarlet, MD      . alum & mag hydroxide-simeth (MAALOX/MYLANTA) 200-200-20 MG/5ML suspension 30 mL  30 mL Oral Q4H PRN Salley Scarlet, MD      . atorvastatin (LIPITOR) tablet 80 mg  80 mg Oral Daily Salley Scarlet, MD   80 mg at 01/04/21 0817  . carvedilol (COREG) tablet 25 mg  25 mg Oral BID WC Salley Scarlet, MD   25 mg at 01/04/21 0700  . clonazePAM (KLONOPIN) tablet 0.5 mg  0.5 mg Oral QHS PRN Salley Scarlet, MD      . dapagliflozin propanediol [name]) tablet 10 mg  10 mg Oral Daily Salley Scarlet, MD   10 mg at 01/04/21 0818  . ezetimibe (ZETIA) tablet 10 mg  10 mg Oral Daily Salley Scarlet, MD   10 mg at 01/04/21 0817  . FLUoxetine (PROZAC) capsule 30 mg  30 mg Oral Daily Salley Scarlet, MD   30 mg at 01/04/21 0817  . furosemide (LASIX) tablet 40 mg  40 mg Oral Daily Salley Scarlet, MD   40 mg at 01/04/21 0817  . ibuprofen (ADVIL) tablet 400 mg  400 mg Oral Q6H PRN Salley Scarlet, MD   400 mg at 01/04/21 0827  . isosorbide-hydrALAZINE (BIDIL) 20-37.5 MG per tablet 2 tablet  2 tablet Oral TID Salley Scarlet, MD   2 tablet at 01/04/21 0817  . magnesium hydroxide (MILK OF MAGNESIA) suspension 30 mL  30 mL Oral Daily PRN Salley Scarlet, MD      . polyethylene glycol (MIRALAX / GLYCOLAX) packet 17 g  17 g Oral Daily PRN Salley Scarlet, MD      . sacubitril-valsartan  [facility]) 49-51 mg per tablet  1 tablet Oral BID Salley Scarlet, MD   1 tablet at 01/04/21 0818  . spironolactone (ALDACTONE) tablet 25 mg  25 mg Oral Daily Salley Scarlet, MD   25 mg at 01/04/21 0817  . timolol (TIMOPTIC) 0.5 % ophthalmic solution 1 drop  1 drop Both Eyes Daily Salley Scarlet, MD   1 drop at 01/04/21 0818   PTA Medications: Medications Prior to Admission  Medication Sig Dispense Refill Last Dose  . aspirin 81 MG tablet Take 1 tablet (81 mg total) by mouth daily. (Patient not taking: Reported on 11/18/2018) 30 tablet 6   . atorvastatin (LIPITOR) 80 MG tablet Take 1 tablet (80 mg total) by mouth daily. 30 tablet 6   . carvedilol (COREG) 25 MG tablet Take 1 tablet (25 mg total) by mouth 2 (two) times daily with a meal.     . clonazePAM (KLONOPIN) 0.5 MG tablet Take 1 tablet (0.5 mg total) by mouth at bedtime as needed (sleep, anxiety). 30 tablet 0   . dapagliflozin propanediol (FARXIGA) 10 MG TABS tablet Take 1 tablet (10 mg total) by mouth daily. 30 tablet    . ezetimibe (ZETIA)  10 MG tablet Take 1 tablet (10 mg total) by mouth daily. 30 tablet 6   . FLUoxetine (PROZAC) 10 MG capsule Take 3 capsules (30 mg total) by mouth daily.  3   . furosemide (LASIX) 40 MG tablet TAKE 1 TABLET (40 MG TOTAL) BY MOUTH DAILY. 30 tablet 1   . glucose blood (ONE TOUCH ULTRA TEST) test strip Use as instructed 30 each 12   . ibuprofen (ADVIL) 400 MG tablet Take 1 tablet (400 mg total) by mouth every 6 (six) hours as needed for headache or moderate pain. 30 tablet 0   . isosorbide-hydrALAZINE (BIDIL) 20-37.5 MG tablet Take 2 tablets by mouth 3 (three) times daily.     . sacubitril-valsartan (ENTRESTO) 49-51 MG Take 1 tablet by mouth 2 (two) times daily. 60 tablet    . spironolactone (ALDACTONE) 25 MG tablet Take 1 tablet (25 mg total) by mouth daily.     . timolol (TIMOPTIC) 0.5 % ophthalmic solution Place 1 drop into both eyes daily after breakfast.       Patient Stressors: Health  problems Medication change or noncompliance  Patient Strengths: Ability for insight Motivation for treatment/growth  Treatment Modalities: Medication Management, Group therapy, Case management,  1 to 1 session with clinician, Psychoeducation, Recreational therapy.   Physician Treatment Plan for Primary Diagnosis: <principal problem not specified> Long Term Goal(s):     Short Term Goals:    Medication Management: Evaluate patient's response, side effects, and tolerance of medication regimen.  Therapeutic Interventions: 1 to 1 sessions, Unit Group sessions and Medication administration.  Evaluation of Outcomes: Not Met  Physician Treatment Plan for Secondary Diagnosis: Active Problems:   MDD (major depressive disorder), recurrent severe, without psychosis ([place])  Long Term Goal(s):     Short Term Goals:       Medication Management: Evaluate patient's response, side effects, and tolerance of medication regimen.  Therapeutic Interventions: 1 to 1 sessions, Unit Group sessions and Medication administration.  Evaluation of Outcomes: Not Met   RN Treatment Plan for Primary Diagnosis: <principal problem not specified> Long Term Goal(s): Knowledge of disease and therapeutic regimen to maintain health will improve  Short Term Goals: Ability to demonstrate self-control, Ability to participate in decision making will improve, Ability to verbalize feelings will improve, Ability to disclose and discuss suicidal ideas, Ability to identify and develop effective coping behaviors will improve and Compliance with prescribed medications will improve  Medication Management: RN will administer medications as ordered by provider, will assess and evaluate patient's response and provide education to patient for prescribed medication. RN will report any adverse and/or side effects to prescribing provider.  Therapeutic Interventions: 1 on 1 counseling sessions, Psychoeducation, Medication  administration, Evaluate responses to treatment, Monitor vital signs and CBGs as ordered, Perform/monitor CIWA, COWS, AIMS and Fall Risk screenings as ordered, Perform wound care treatments as ordered.  Evaluation of Outcomes: Not Met   LCSW Treatment Plan for Primary Diagnosis: <principal problem not specified> Long Term Goal(s): Safe transition to appropriate next level of care at discharge, Engage patient in therapeutic group addressing interpersonal concerns.  Short Term Goals: Engage patient in aftercare planning with referrals and resources, Increase social support, Increase ability to appropriately verbalize feelings, Increase emotional regulation, Facilitate acceptance of mental health diagnosis and concerns and Increase skills for wellness and recovery  Therapeutic Interventions: Assess for all discharge needs, 1 to 1 time with Social worker, Explore available resources and support systems, Assess for adequacy in community support network, Educate family and  significant other(s) on suicide prevention, Complete Psychosocial Assessment, Interpersonal group therapy.  Evaluation of Outcomes: Not Met   Progress in Treatment: Attending groups: No. Participating in groups: No. Taking medication as prescribed: Yes. Toleration medication: Yes. Family/Significant other contact made: No, will contact:  once permission is given. Patient understands diagnosis: Yes. Discussing patient identified problems/goals with staff: Yes. Medical problems stabilized or resolved: Yes. Denies suicidal/homicidal ideation: Yes. Issues/concerns per patient self-inventory: No. Other: none  New problem(s) identified: No, Describe:  none  New Short Term/Long Term Goal(s):  Patient Goals:  "my anxiety and dealing with the passing of my wife"  Discharge Plan or Barriers: CSW will assist patient in developing an appropriate discharge plan.    Reason for Continuation of Hospitalization:  Anxiety Depression Medication stabilization Suicidal ideation  Estimated Length of Stay:  1-7 days  Recreational Therapy: Patient Stressors: N/A Patient Goal: Patient will engage in groups without prompting or encouragement from LRT x3 group sessions within 5 recreation therapy group sessions.  Attendees: Patient: Matthew [name] 01/04/2021 9:59 AM  Physician: Dr. Domingo Cocking, MD 01/04/2021 9:59 AM  Nursing: Graceann Congress, RN 01/04/2021 9:59 AM  RN Care Manager: 01/04/2021 9:59 AM  Social Worker: Assunta Curtis, MSW, LCSW 01/04/2021 9:59 AM  Recreational Therapist: Roanna Epley, Reather Converse, LRT 01/04/2021 9:59 AM  Other: Paulla Dolly, MSW, [place], [place] 01/04/2021 9:59 AM  Other:  01/04/2021 9:59 AM  Other: 01/04/2021 9:59 AM    Scribe for Treatment Team: Rozann Lesches, LCSW 01/04/2021 9:59 AM

## 2021-01-04 NOTE — BHH Group Notes (Signed)
[facility] Group Notes:  (Nursing/MHT/Case Management/Adjunct)  Date:  01/04/2021  Time:  10:14 PM  Type of Therapy:  Group Therapy  Participation Level:  Did Not Attend  Summary of Progress/Problems:  Matthew [name] 01/04/2021, 10:14 PM

## 2021-01-04 NOTE — [facility] Group Notes (Signed)
LCSW Group Therapy Note  01/04/2021 2:10 PM  Type of Therapy/Topic:  Group Therapy:  Emotion Regulation  Participation Level:  Did Not Attend   Description of Group:   The purpose of this group is to assist patients in learning to regulate negative emotions and experience positive emotions. Patients will be guided to discuss ways in which they have been vulnerable to their negative emotions. These vulnerabilities will be juxtaposed with experiences of positive emotions or situations, and patients will be challenged to use positive emotions to combat negative ones. Special emphasis will be placed on coping with negative emotions in conflict situations, and patients will process healthy conflict resolution skills.  Therapeutic Goals: 1. Patient will identify two positive emotions or experiences to reflect on in order to balance out negative emotions 2. Patient will label two or more emotions that they find the most difficult to experience 3. Patient will demonstrate positive conflict resolution skills through discussion and/or role plays  Summary of Patient Progress: Patient did not attend group despite encouraged participation.   Therapeutic Modalities:   Cognitive Behavioral Therapy Feelings Identification Dialectical Behavioral Therapy  Paulla Dolly, MSW, [place], [place] 01/04/2021 2:10 PM

## 2021-01-04 NOTE — BHH Suicide Risk Assessment (Signed)
[place] INPATIENT:  Family/Significant Other Suicide Prevention Education  Suicide Prevention Education:  Patient Refusal for Family/Significant Other Suicide Prevention Education: The patient Matthew [name]. has refused to provide written consent for family/significant other to be provided Family/Significant Other Suicide Prevention Education during admission and/or prior to discharge.  Physician notified.  SPE completed with pt, as pt refused to consent to family contact. SPI pamphlet provided to pt and pt was encouraged to share information with support network, ask questions, and talk about any concerns relating to SPE. Pt denies access to guns/firearms and verbalized understanding of information provided. Mobile Crisis information also provided to pt.  Matthew [name] 01/04/2021, 12:57 PM

## 2021-01-04 NOTE — [facility] Suicide Risk Assessment (Signed)
[facility] Admission Suicide Risk Assessment   Nursing information obtained from:  Patient Demographic factors:  Male,Low socioeconomic status Current Mental Status:  Suicidal ideation indicated by patient Loss Factors:  Decline in physical health Historical Factors:  Impulsivity Risk Reduction Factors:  Positive therapeutic relationship  Total Time spent with patient: 1 hour Principal Problem: <principal problem not specified> Diagnosis:  Active Problems:   MDD (major depressive disorder), recurrent severe, without psychosis ([place])  Subjective Data: 55 year old male with history of major depressive disorder presenting with worsening depression and suicidal ideations in context of worsening health and grief. His wife passed away unexpectedly last 02-28-2023, and patient has experienced difficulty with bereavement and grief. He notes it is difficult to not feel guilty over her death for not advocating for her to address her own health concerns. He notes he was also hospitalized for heart failure at the time, and wonders if he had been home if things would be different now. Since she has passed, he often struggles to pay his bills as well. He note she was much better with money, and handled all the bills previously, and also contributed to their household income. He feels the holidays were also a big trigger for depression as Christmas was her favorite holiday. He has been experiencing poor energy, anhedonia, hopelessness, helplessness, depressed mood, and passive SI. He has not been able to keep his appointments or fill his prescriptions on time, and feels that depression is contributing to his increase in heart failure exacerbations. Supportive psychotherapy provided. Approximately 20 minutes also spent providing grief counseling. Notebook provided to patient, and encouraged him to write down good memories he has with his wife to help him focus on positive aspects of their relationships.   Continued Clinical  Symptoms:  Alcohol Use Disorder Identification Test Final Score (AUDIT): 0 The "Alcohol Use Disorders Identification Test", Guidelines for Use in Primary Care, Second Edition.  World [employer] [facility]). Score between 0-7:  no or low risk or alcohol related problems. Score between 8-15:  moderate risk of alcohol related problems. Score between 16-19:  high risk of alcohol related problems. Score 20 or above:  warrants further diagnostic evaluation for alcohol dependence and treatment.   CLINICAL FACTORS:   Depression:   Anhedonia Hopelessness Insomnia Severe Unstable or Poor Therapeutic Relationship Previous Psychiatric Diagnoses and Treatments Medical Diagnoses and Treatments/Surgeries   Musculoskeletal: Strength & Muscle Tone: within normal limits Gait & Station: normal Patient leans: N/A  Psychiatric Specialty Exam: Physical Exam Vitals and nursing note reviewed.  Constitutional:      Appearance: Normal appearance.  HENT:     Head: Normocephalic and atraumatic.     Right Ear: External ear normal.     Left Ear: External ear normal.     Nose: Nose normal.     Mouth/Throat:     Mouth: Mucous membranes are moist.     Pharynx: Oropharynx is clear.  Eyes:     Extraocular Movements: Extraocular movements intact.     Conjunctiva/sclera: Conjunctivae normal.     Pupils: Pupils are equal, round, and reactive to light.  Cardiovascular:     Rate and Rhythm: Normal rate.     Pulses: Normal pulses.  Pulmonary:     Effort: Pulmonary effort is normal.     Breath sounds: Normal breath sounds.  Abdominal:     General: Abdomen is flat.     Palpations: Abdomen is soft.  Musculoskeletal:        General: No swelling. Normal range of  motion.     Cervical back: Normal range of motion and neck supple.  Skin:    General: Skin is warm and dry.  Neurological:     General: No focal deficit present.     Mental Status: He is alert and oriented to person, place, and time.   Psychiatric:        Attention and Perception: Attention and perception normal.        Mood and Affect: Mood is depressed. Affect is tearful.        Speech: Speech normal.        Behavior: Behavior is withdrawn.        Thought Content: Thought content includes suicidal ideation. Thought content does not include suicidal plan.        Cognition and Memory: Cognition and memory normal.        Judgment: Judgment normal.     Review of Systems  Constitutional: Positive for fatigue. Negative for appetite change.  HENT: Negative for rhinorrhea and sore throat.   Eyes: Negative for photophobia and visual disturbance.  Respiratory: Negative for cough and shortness of breath.   Cardiovascular: Negative for chest pain and palpitations.  Gastrointestinal: Negative for constipation, diarrhea, nausea and vomiting.  Endocrine: Negative for cold intolerance and heat intolerance.  Genitourinary: Negative for difficulty urinating and dysuria.  Musculoskeletal: Negative for arthralgias and myalgias.  Skin: Negative for rash and wound.  Allergic/Immunologic: Negative for environmental allergies and food allergies.  Neurological: Positive for headaches. Negative for dizziness.  Hematological: Negative for adenopathy. Does not bruise/bleed easily.  Psychiatric/Behavioral: Positive for dysphoric mood and suicidal ideas.    Blood pressure 123/84, pulse (!) 58, temperature 98.1 F (36.7 C), temperature source Oral, resp. rate 17, height 5\' 10"  (1.778 m), weight 87.9 kg, SpO2 100 %.Body mass index is 27.81 kg/m.  General Appearance: Fairly Groomed  Eye Contact:  Fair  Speech:  Clear and Coherent  Volume:  Normal  Mood:  Depressed and Dysphoric  Affect:  Congruent and Tearful  Thought Process:  Coherent and Linear  Orientation:  Full (Time, Place, and Person)  Thought Content:  Logical  Suicidal Thoughts:  Yes.  without intent/plan  Homicidal Thoughts:  No  Memory:  Immediate;   Fair Recent;    Fair Remote;   Fair  Judgement:  Intact  Insight:  Fair  Psychomotor Activity:  Decreased  Concentration:  Concentration: Fair and Attention Span: Fair  Recall:  [employer] of Knowledge:  Fair  Language:  Fair  Akathisia:  Negative  Handed:  Right  AIMS (if indicated):     Assets:  Communication Skills Desire for Improvement Financial Resources/Insurance Housing Resilience Social Support  ADL's:  Intact  Cognition:  WNL  Sleep:         COGNITIVE FEATURES THAT CONTRIBUTE TO RISK:  Polarized thinking    SUICIDE RISK:   Moderate:  Frequent suicidal ideation with limited intensity, and duration, some specificity in terms of plans, no associated intent, good self-control, limited dysphoria/symptomatology, some risk factors present, and identifiable protective factors, including available and accessible social support.  PLAN OF CARE: 55 year old male with history of major depressive disorder presenting with worsening depression and suicidal ideations in context of worsening health and grief. Continue inpatient admission and home medications for heart failure. Increase Prozac 40 mg daily for depression. Will continue to offer grief counseling during admission.   I certify that inpatient services furnished can reasonably be expected to improve the patient's condition.   Jinny Blossom  Denna Haggard, MD 01/04/2021, 12:18 PM

## 2021-01-04 NOTE — Plan of Care (Signed)
Patient appears with a depressive mood and low energy level.Patient rated his depression 2/10 and anxiety 5/10.Denies SI,HI and AVH.Patient stated that his mood is " little " better today. Did not attend group.Compliant with medications.Appetite and energy level good.Support and encouragement given.

## 2021-01-04 NOTE — Progress Notes (Signed)
Recreation Therapy Notes   Date: 01/04/2021  Time: 9:30 am   Location: Craft room     Behavioral response: N/A   Intervention Topic: Time Management   Discussion/Intervention: Patient did not attend group.   Clinical Observations/Feedback:  Patient did not attend group.   Jodean Valade LRT/CTRS        Zakirah Weingart 01/04/2021 10:39 AM 

## 2021-01-04 NOTE — H&P (Signed)
Psychiatric Admission Assessment Adult  Patient Identification: Matthew [name]. MRN:  [id number] Date of Evaluation:  01/04/2021 Chief Complaint:  MDD (major depressive disorder), recurrent severe, without psychosis ([place]) [F33.2] Principal Diagnosis: MDD (major depressive disorder), recurrent severe, without psychosis ([place]) Diagnosis:  Principal Problem:   MDD (major depressive disorder), recurrent severe, without psychosis ([place]) Active Problems:   Hypertension   Chronic combined systolic and diastolic congestive heart failure ([place])   Diabetes mellitus type II, controlled ([place])  History of Present Illness: 55 year old male with history of major depressive disorder presenting with worsening depression and suicidal ideations in context of worsening health and grief. His wife passed away unexpectedly last 21-Mar-2023, and patient has experienced difficulty with bereavement and grief. He notes it is difficult to not feel guilty over her death for not advocating for her to address her own health concerns. He notes he was also hospitalized for heart failure at the time, and wonders if he had been home if things would be different now. Since she has passed, he often struggles to pay his bills as well. He note she was much better with money, and handled all the bills previously, and also contributed to their household income. He feels the holidays were also a big trigger for depression as Christmas was her favorite holiday. He has been experiencing poor energy, anhedonia, hopelessness, helplessness, depressed mood, and passive SI. He has not been able to keep his appointments or fill his prescriptions on time, and feels that depression is contributing to his increase in heart failure exacerbations. Supportive psychotherapy provided. Approximately 20 minutes also spent providing grief counseling. Notebook provided to patient, and encouraged him to write down good memories he has with his wife to help him focus on  positive aspects of their relationships.   Associated Signs/Symptoms: Depression Symptoms:  depressed mood, anhedonia, insomnia, psychomotor retardation, feelings of worthlessness/guilt, hopelessness, suicidal thoughts with specific plan, Duration of Depression Symptoms: No data recorded (Hypo) Manic Symptoms:  Impulsivity, Anxiety Symptoms:  Excessive Worry, Psychotic Symptoms:  Denies Duration of Psychotic Symptoms: No data recorded PTSD Symptoms: Negative Total Time spent with patient: 1 hour  Past Psychiatric History: No previous inpatient or outpatient psychiatric care. No previous medication trials aside from Prozac. No previous suicide attempts.   Is the patient at risk to self? Yes.    Has the patient been a risk to self in the past 6 months? No.  Has the patient been a risk to self within the distant past? No.  Is the patient a risk to others? No.  Has the patient been a risk to others in the past 6 months? No.  Has the patient been a risk to others within the distant past? No.   Prior Inpatient Therapy:   Prior Outpatient Therapy:    Alcohol Screening: 1. How often do you have a drink containing alcohol?: Never 2. How many drinks containing alcohol do you have on a typical day when you are drinking?: 1 or 2 3. How often do you have six or more drinks on one occasion?: Never AUDIT-C Score: 0 4. How often during the last year have you found that you were not able to stop drinking once you had started?: Never 5. How often during the last year have you failed to do what was normally expected from you because of drinking?: Never 6. How often during the last year have you needed a first drink in the morning to get yourself going after a heavy drinking session?: Never  7. How often during the last year have you had a feeling of guilt of remorse after drinking?: Never 8. How often during the last year have you been unable to remember what happened the night before because you  had been drinking?: Never 9. Have you or someone else been injured as a result of your drinking?: No 10. Has a relative or friend or a doctor or another health worker been concerned about your drinking or suggested you cut down?: No Alcohol Use Disorder Identification Test Final Score (AUDIT): 0 Alcohol Brief Interventions/Follow-up: AUDIT Score <7 follow-up not indicated Substance Abuse History in the last 12 months:  No. Consequences of Substance Abuse: Negative Previous Psychotropic Medications: Yes  Psychological Evaluations: Yes  Past Medical History:  Past Medical History:  Diagnosis Date  . Acute combined systolic and diastolic CHF, NYHA class 3 ([place])    a. 02/2014 Echo: EF 25-30%.  . Cardiomyopathy ([place])    a. 02/2014 Echo: EF 25-30%, sev glob HK with inferolat HK->AK, mod conc LVH, Gr 2 DD, Mild MR, sev dil LA.  . Depression   . DM (diabetes mellitus) ([place])   . High cholesterol   . Hypertension   . Morbid obesity ([place])   . Tobacco abuse     Past Surgical History:  Procedure Laterality Date  . CATARACT EXTRACTION Right   . LEFT HEART CATHETERIZATION WITH CORONARY ANGIOGRAM N/A 03/04/2014   Procedure: LEFT HEART CATHETERIZATION WITH CORONARY ANGIOGRAM;  Surgeon: Burnell Blanks, MD;  Location: [facility] CATH LAB;  Service: Cardiovascular;  Laterality: N/A;  . TEE WITHOUT CARDIOVERSION N/A 02/04/2017   Procedure: TRANSESOPHAGEAL ECHOCARDIOGRAM (TEE);  Surgeon: Dorothy Spark, MD;  Location: [facility] ENDOSCOPY;  Service: Cardiovascular;  Laterality: N/A;   Family History:  Family History  Problem Relation Age of Onset  . Heart attack Father        died @ 28  . Cervical cancer Mother        died @ 56   Family Psychiatric  History: Denies Tobacco Screening: Have you used any form of tobacco in the last 30 days? (Cigarettes, Smokeless Tobacco, Cigars, and/or Pipes): No Social History:  Social History   Substance and Sexual Activity  Alcohol Use No     Social History    Substance and Sexual Activity  Drug Use No    Additional Social History:                           Allergies:  No Known Allergies Lab Results:  Results for orders placed or performed during the hospital encounter of 12/24/20 (from the past 48 hour(s))  Basic metabolic panel     Status: Abnormal   Collection Time: 01/03/21 12:55 AM  Result Value Ref Range   Sodium 137 135 - 145 mmol/L   Potassium 4.1 3.5 - 5.1 mmol/L   Chloride 106 98 - 111 mmol/L   CO2 21 (L) 22 - 32 mmol/L   Glucose, Bld 95 70 - 99 mg/dL    Comment: Glucose reference range applies only to samples taken after fasting for at least 8 hours.   BUN 23 (H) 6 - 20 mg/dL   Creatinine, Ser 1.44 (H) 0.61 - 1.24 mg/dL   Calcium 8.6 (L) 8.9 - 10.3 mg/dL   GFR, Estimated 58 (L) >60 mL/min    Comment: (NOTE) Calculated using the CKD-EPI Creatinine Equation (2021)    Anion gap 10 5 - 15    Comment:  Performed at [facility], [place] [address], [place], [place] [address]  Resp Panel by RT-PCR (Flu A&B, Covid) Nasopharyngeal Swab     Status: None   Collection Time: 01/03/21  4:03 PM   Specimen: Nasopharyngeal Swab; Nasopharyngeal(NP) swabs in vial transport medium  Result Value Ref Range   SARS Coronavirus 2 by RT PCR NEGATIVE NEGATIVE    Comment: (NOTE) SARS-CoV-2 target nucleic acids are NOT DETECTED.  The SARS-CoV-2 RNA is generally detectable in upper respiratory specimens during the acute phase of infection. The lowest concentration of SARS-CoV-2 viral copies this assay can detect is 138 copies/mL. A negative result does not preclude SARS-Cov-2 infection and should not be used as the sole basis for treatment or other patient management decisions. A negative result may occur with  improper specimen collection/handling, submission of specimen other than nasopharyngeal swab, presence of viral mutation(s) within the areas targeted by this assay, and inadequate number of viral copies(<138 copies/mL). A  negative result must be combined with clinical observations, patient history, and epidemiological information. The expected result is Negative.  Fact Sheet for Patients:  EntrepreneurPulse.com.au  Fact Sheet for Healthcare Providers:  IncredibleEmployment.be  This test is no t yet approved or cleared by the [place] FDA and  has been authorized for detection and/or diagnosis of SARS-CoV-2 by FDA under an Emergency Use Authorization (EUA). This EUA will remain  in effect (meaning this test can be used) for the duration of the COVID-19 declaration under Section 564(b)(1) of the Act, 21 U.S.C.section 360bbb-3(b)(1), unless the authorization is terminated  or revoked sooner.       Influenza A by PCR NEGATIVE NEGATIVE   Influenza B by PCR NEGATIVE NEGATIVE    Comment: (NOTE) The Xpert Xpress SARS-CoV-2/FLU/RSV plus assay is intended as an aid in the diagnosis of influenza from Nasopharyngeal swab specimens and should not be used as a sole basis for treatment. Nasal washings and aspirates are unacceptable for Xpert Xpress SARS-CoV-2/FLU/RSV testing.  Fact Sheet for Patients: EntrepreneurPulse.com.au  Fact Sheet for Healthcare Providers: IncredibleEmployment.be  This test is not yet approved or cleared by the [place] FDA and has been authorized for detection and/or diagnosis of SARS-CoV-2 by FDA under an Emergency Use Authorization (EUA). This EUA will remain in effect (meaning this test can be used) for the duration of the COVID-19 declaration under Section 564(b)(1) of the Act, 21 U.S.C. section 360bbb-3(b)(1), unless the authorization is terminated or revoked.  Performed at [facility], [place] [address] [place],  [address]     Blood Alcohol level:  No results found for: [facility]  Metabolic Disorder Labs:  Lab Results  Component Value Date   HGBA1C 5.5 12/27/2020   MPG 111.15  12/27/2020   MPG 142.72 07/04/2020   No results found for: PROLACTIN Lab Results  Component Value Date   CHOL 97 12/27/2020   TRIG 67 12/27/2020   HDL 35 (L) 12/27/2020   CHOLHDL 2.8 12/27/2020   VLDL 13 12/27/2020   LDLCALC 49 12/27/2020   LDLCALC 73 03/07/2018    Current Medications: Current Facility-Administered Medications  Medication Dose Route Frequency Provider Last Rate Last Admin  . acetaminophen (TYLENOL) tablet 650 mg  650 mg Oral Q6H PRN Salley Scarlet, MD       Or  . acetaminophen (TYLENOL) suppository 650 mg  650 mg Rectal Q6H PRN Salley Scarlet, MD      . alum & mag hydroxide-simeth (MAALOX/MYLANTA) 200-200-20 MG/5ML suspension 30 mL  30  mL Oral Q4H PRN Salley Scarlet, MD      . atorvastatin (LIPITOR) tablet 80 mg  80 mg Oral Daily Salley Scarlet, MD   80 mg at 01/04/21 0817  . carvedilol (COREG) tablet 25 mg  25 mg Oral BID WC Salley Scarlet, MD   25 mg at 01/04/21 0700  . clonazePAM (KLONOPIN) tablet 0.5 mg  0.5 mg Oral QHS PRN Salley Scarlet, MD      . dapagliflozin propanediol [name]) tablet 10 mg  10 mg Oral Daily Salley Scarlet, MD   10 mg at 01/04/21 0818  . ezetimibe (ZETIA) tablet 10 mg  10 mg Oral Daily Salley Scarlet, MD   10 mg at 01/04/21 0817  . [START ON 01/05/2021] FLUoxetine (PROZAC) capsule 40 mg  40 mg Oral Daily Salley Scarlet, MD      . furosemide (LASIX) tablet 40 mg  40 mg Oral Daily Salley Scarlet, MD   40 mg at 01/04/21 0817  . ibuprofen (ADVIL) tablet 400 mg  400 mg Oral Q6H PRN Salley Scarlet, MD   400 mg at 01/04/21 0827  . isosorbide-hydrALAZINE (BIDIL) 20-37.5 MG per tablet 2 tablet  2 tablet Oral TID Salley Scarlet, MD   2 tablet at 01/04/21 0817  . magnesium hydroxide (MILK OF MAGNESIA) suspension 30 mL  30 mL Oral Daily PRN Salley Scarlet, MD      . polyethylene glycol (MIRALAX / GLYCOLAX) packet 17 g  17 g Oral Daily PRN Salley Scarlet, MD      . sacubitril-valsartan [facility]) 49-51 mg per tablet  1 tablet  Oral BID Salley Scarlet, MD   1 tablet at 01/04/21 0818  . spironolactone (ALDACTONE) tablet 25 mg  25 mg Oral Daily Salley Scarlet, MD   25 mg at 01/04/21 0817  . timolol (TIMOPTIC) 0.5 % ophthalmic solution 1 drop  1 drop Both Eyes Daily Salley Scarlet, MD   1 drop at 01/04/21 0818   PTA Medications: Medications Prior to Admission  Medication Sig Dispense Refill Last Dose  . aspirin 81 MG tablet Take 1 tablet (81 mg total) by mouth daily. (Patient not taking: Reported on 11/18/2018) 30 tablet 6   . atorvastatin (LIPITOR) 80 MG tablet Take 1 tablet (80 mg total) by mouth daily. 30 tablet 6   . carvedilol (COREG) 25 MG tablet Take 1 tablet (25 mg total) by mouth 2 (two) times daily with a meal.     . clonazePAM (KLONOPIN) 0.5 MG tablet Take 1 tablet (0.5 mg total) by mouth at bedtime as needed (sleep, anxiety). 30 tablet 0   . dapagliflozin propanediol (FARXIGA) 10 MG TABS tablet Take 1 tablet (10 mg total) by mouth daily. 30 tablet    . ezetimibe (ZETIA) 10 MG tablet Take 1 tablet (10 mg total) by mouth daily. 30 tablet 6   . FLUoxetine (PROZAC) 10 MG capsule Take 3 capsules (30 mg total) by mouth daily.  3   . furosemide (LASIX) 40 MG tablet TAKE 1 TABLET (40 MG TOTAL) BY MOUTH DAILY. 30 tablet 1   . glucose blood (ONE TOUCH ULTRA TEST) test strip Use as instructed 30 each 12   . ibuprofen (ADVIL) 400 MG tablet Take 1 tablet (400 mg total) by mouth every 6 (six) hours as needed for headache or moderate pain. 30 tablet 0   . isosorbide-hydrALAZINE (BIDIL) 20-37.5 MG tablet Take 2 tablets by mouth 3 (three) times daily.     Marland Kitchen  sacubitril-valsartan (ENTRESTO) 49-51 MG Take 1 tablet by mouth 2 (two) times daily. 60 tablet    . spironolactone (ALDACTONE) 25 MG tablet Take 1 tablet (25 mg total) by mouth daily.     . timolol (TIMOPTIC) 0.5 % ophthalmic solution Place 1 drop into both eyes daily after breakfast.       Musculoskeletal: Strength & Muscle Tone: within normal limits Gait &  Station: normal Patient leans: N/A  Psychiatric Specialty Exam: Physical Exam Vitals and nursing note reviewed.  Constitutional:      Appearance: Normal appearance.  HENT:     Head: Normocephalic and atraumatic.     Right Ear: External ear normal.     Left Ear: External ear normal.     Nose: Nose normal.     Mouth/Throat:     Mouth: Mucous membranes are moist.     Pharynx: Oropharynx is clear.  Eyes:     Extraocular Movements: Extraocular movements intact.     Conjunctiva/sclera: Conjunctivae normal.     Pupils: Pupils are equal, round, and reactive to light.  Cardiovascular:     Rate and Rhythm: Normal rate.     Pulses: Normal pulses.  Pulmonary:     Effort: Pulmonary effort is normal.     Breath sounds: Normal breath sounds.  Abdominal:     General: Abdomen is flat.     Palpations: Abdomen is soft.  Musculoskeletal:        General: No swelling. Normal range of motion.     Cervical back: Normal range of motion and neck supple.  Skin:    General: Skin is warm and dry.  Neurological:     General: No focal deficit present.     Mental Status: He is alert and oriented to person, place, and time.  Psychiatric:        Attention and Perception: Attention and perception normal.        Mood and Affect: Mood is depressed. Affect is tearful.        Speech: Speech normal.        Behavior: Behavior is withdrawn.        Thought Content: Thought content includes suicidal ideation. Thought content does not include suicidal plan.        Cognition and Memory: Cognition and memory normal.        Judgment: Judgment normal.     Review of Systems  Constitutional: Positive for fatigue. Negative for appetite change.  HENT: Negative for rhinorrhea and sore throat.   Eyes: Negative for photophobia and visual disturbance.  Respiratory: Negative for cough and shortness of breath.   Cardiovascular: Negative for chest pain and palpitations.  Gastrointestinal: Negative for constipation,  diarrhea, nausea and vomiting.  Endocrine: Negative for cold intolerance and heat intolerance.  Genitourinary: Negative for difficulty urinating and dysuria.  Musculoskeletal: Negative for arthralgias and myalgias.  Skin: Negative for rash and wound.  Allergic/Immunologic: Negative for environmental allergies and food allergies.  Neurological: Positive for headaches. Negative for dizziness.  Hematological: Negative for adenopathy. Does not bruise/bleed easily.  Psychiatric/Behavioral: Positive for dysphoric mood and suicidal ideas.    Blood pressure 123/84, pulse (!) 58, temperature 98.1 F (36.7 C), temperature source Oral, resp. rate 17, height 5\' 10"  (1.778 m), weight 87.9 kg, SpO2 100 %.Body mass index is 27.81 kg/m.  General Appearance: Fairly Groomed  Eye Contact:  Fair  Speech:  Clear and Coherent  Volume:  Normal  Mood:  Depressed and Dysphoric  Affect:  Congruent and Tearful  Thought Process:  Coherent and Linear  Orientation:  Full (Time, Place, and Person)  Thought Content:  Logical  Suicidal Thoughts:  Yes.  without intent/plan  Homicidal Thoughts:  No  Memory:  Immediate;   Fair Recent;   Fair Remote;   Fair  Judgement:  Intact  Insight:  Fair  Psychomotor Activity:  Decreased  Concentration:  Concentration: Fair and Attention Span: Fair  Recall:  [employer] of Knowledge:  Fair  Language:  Fair  Akathisia:  Negative  Handed:  Right  AIMS (if indicated):     Assets:  Communication Skills Desire for Improvement Financial Resources/Insurance Housing Resilience Social Support  ADL's:  Intact  Cognition:  WNL  Sleep:          Treatment Plan Summary: Daily contact with patient to assess and evaluate symptoms and progress in treatment and Medication management PLAN OF CARE: 55 year old male with history of major depressive disorder presenting with worsening depression and suicidal ideations in context of worsening health and grief. Continue inpatient  admission and home medications for heart failure. Increase Prozac 40 mg daily for depression. Will continue to offer grief counseling during admission.   Observation Level/Precautions:  15 minute checks  Laboratory:  Completed in outside hospital  Psychotherapy:    Medications:    Consultations:    Discharge Concerns:    Estimated LOS:  Other:     Physician Treatment Plan for Primary Diagnosis: MDD (major depressive disorder), recurrent severe, without psychosis ([place]) Long Term Goal(s): Improvement in symptoms so as ready for discharge  Short Term Goals: Ability to identify changes in lifestyle to reduce recurrence of condition will improve, Ability to verbalize feelings will improve, Ability to disclose and discuss suicidal ideas, Ability to demonstrate self-control will improve, Ability to identify and develop effective coping behaviors will improve and Compliance with prescribed medications will improve  Physician Treatment Plan for Secondary Diagnosis: Principal Problem:   MDD (major depressive disorder), recurrent severe, without psychosis ([place]) Active Problems:   Hypertension   Chronic combined systolic and diastolic congestive heart failure ([place])   Diabetes mellitus type II, controlled ([place])  Long Term Goal(s): Improvement in symptoms so as ready for discharge  Short Term Goals: Ability to identify changes in lifestyle to reduce recurrence of condition will improve, Ability to verbalize feelings will improve, Ability to disclose and discuss suicidal ideas, Ability to demonstrate self-control will improve, Ability to identify and develop effective coping behaviors will improve and Compliance with prescribed medications will improve  I certify that inpatient services furnished can reasonably be expected to improve the patient's condition.    Salley Scarlet, MD 1/19/202212:32 PM

## 2021-01-05 ENCOUNTER — Other Ambulatory Visit: Payer: Self-pay | Admitting: Behavioral Health

## 2021-01-05 MED ORDER — EZETIMIBE 10 MG PO TABS: 10.0000 mg | ORAL_TABLET | Freq: Every day | ORAL | 0 refills | Status: DC

## 2021-01-05 MED ORDER — FUROSEMIDE 40 MG PO TABS: 40.0000 mg | ORAL_TABLET | Freq: Every day | ORAL | 0 refills | Status: DC

## 2021-01-05 MED ORDER — CARVEDILOL 25 MG PO TABS: 25.0000 mg | ORAL_TABLET | Freq: Two times a day (BID) | ORAL | 0 refills | Status: DC

## 2021-01-05 MED ORDER — IBUPROFEN 200 MG PO TABS
400.0000 mg | ORAL_TABLET | ORAL | Status: DC | PRN
  Administered 2021-01-05 – 2021-01-06 (×2): 400 mg via ORAL
  Filled 2021-01-05 (×2): qty 2

## 2021-01-05 MED ORDER — SPIRONOLACTONE 25 MG PO TABS: 25.0000 mg | ORAL_TABLET | Freq: Every day | ORAL | 0 refills | Status: DC

## 2021-01-05 MED ORDER — CLONAZEPAM 0.5 MG PO TABS: 0.5000 mg | ORAL_TABLET | Freq: Every evening | ORAL | 0 refills | Status: DC | PRN

## 2021-01-05 MED ORDER — FLUOXETINE HCL 40 MG PO CAPS: 40.0000 mg | ORAL_CAPSULE | Freq: Every day | ORAL | 0 refills | Status: DC

## 2021-01-05 MED ORDER — ISOSORB DINITRATE-HYDRALAZINE 20-37.5 MG PO TABS: 2.0000 | ORAL_TABLET | Freq: Three times a day (TID) | ORAL | 0 refills | Status: DC

## 2021-01-05 MED ORDER — SACUBITRIL-VALSARTAN 49-51 MG PO TABS: 1.0000 | ORAL_TABLET | Freq: Two times a day (BID) | ORAL | 0 refills | Status: DC

## 2021-01-05 MED ORDER — DAPAGLIFLOZIN PROPANEDIOL 10 MG PO TABS: 10.0000 mg | ORAL_TABLET | Freq: Every day | ORAL | 0 refills | Status: DC

## 2021-01-05 MED ORDER — ATORVASTATIN CALCIUM 80 MG PO TABS: 80.0000 mg | ORAL_TABLET | Freq: Every day | ORAL | 0 refills | Status: DC

## 2021-01-05 NOTE — Progress Notes (Signed)
Pt presents with flat, constricted affect on initial interactions. Per pt "My head is still bothering me since last night". Rates his depression 0/10 and anxiety 4/10 when assessed. Brightened up and animated as shift progressed. Denies SI, HI, AVH and pain. Attended scheduled groups when prompted. Verbalized financial concerns "I just need some assistance to get on my feet, be able to pay my utilities, figure things out. I need to talk to the social workers". Received PRN Advil 400 mg PO at 0928 for complain of headache 6/10.  Emotional support and encouragement offered. Scheduled and PRN medications given as ordered with verbal education and effects monitored. Safety checks maintained at Q 15 minutes intervals without self harm gestures or outbursts.  Pt tolerated all meals and medications well without discomfort. Reported his pain 0/10 when reassessed "My headache gone now".

## 2021-01-05 NOTE — Progress Notes (Signed)
Patient pleasant and cooperative. Denies any SI, HI, AVH. Endorses depression and anxiety prn given. Pt also reports headaches. Prn given with good relief. Pt noted smiling and talking with staff and peers on unit. Reports going home soon and afraid about the bills and how to handle them on the outside.  Encouragement and support provided. Safety checks maintained. Medications given as prescribed. Pt receptive and remains safe on unit with q 15 min checks.

## 2021-01-05 NOTE — [facility] Group Notes (Signed)
LCSW Group Therapy Note  01/05/2021 2:14 PM  Type of Therapy/Topic:  Group Therapy:  Balance in Life  Participation Level:  Did Not Attend  Description of Group:    This group will address the concept of balance and how it feels and looks when one is unbalanced. Patients will be encouraged to process areas in their lives that are out of balance and identify reasons for remaining unbalanced. Facilitators will guide patients in utilizing problem-solving interventions to address and correct the stressor making their life unbalanced. Understanding and applying boundaries will be explored and addressed for obtaining and maintaining a balanced life. Patients will be encouraged to explore ways to assertively make their unbalanced needs known to significant others in their lives, using other group members and facilitator for support and feedback.  Therapeutic Goals: 1. Patient will identify two or more emotions or situations they have that consume much of in their lives. 2. Patient will identify signs/triggers that life has become out of balance:  3. Patient will identify two ways to set boundaries in order to achieve balance in their lives:  4. Patient will demonstrate ability to communicate their needs through discussion and/or role plays  Summary of Patient Progress: X     Therapeutic Modalities:   Cognitive Behavioral Therapy Solution-Focused Therapy Assertiveness Training  Assunta Curtis MSW, LCSW 01/05/2021 2:14 PM

## 2021-01-05 NOTE — Plan of Care (Signed)
  Problem: Education: Goal: Mental status will improve Outcome: Progressing Goal: Verbalization of understanding the information provided will improve Outcome: Progressing   Problem: Safety: Goal: Periods of time without injury will increase Outcome: Progressing   

## 2021-01-05 NOTE — Progress Notes (Signed)
Patient presents with sad, flat affect, but brightens on approach. Patient is reporting that he is feeling somewhat better. Is denying SI, HI< AVH but endorsing depression.  Pt did request pain med for headache as well as prn for anxiety. Given with good relief. Pt visible in milieu during snack time.  Encouragement and support provided. Safety checks maintained. Medications given as prescribed. Pt receptive and remains safe on unit with q 15 min checks.

## 2021-01-05 NOTE — Plan of Care (Signed)
  Problem: Education: Goal: Knowledge of [place] [facility] Education information/materials will improve Outcome: Progressing Goal: Emotional status will improve Outcome: Progressing Goal: Mental status will improve Outcome: Progressing Goal: Verbalization of understanding the information provided will improve Outcome: Progressing   Problem: Safety: Goal: Periods of time without injury will increase Outcome: Progressing

## 2021-01-05 NOTE — BHH Counselor (Signed)
CSW met with patient at his request. Patient shared that he had been assisted with resources and applying for medicaid/disability while inpatient at [facility] prior to transfer to [facility]. Patient was not able to remember the contact information of the person who assisted him. Additionally, patient requested resources to assist with utilities, medicaid and SSDI. Per chart review, patient was provided with information for Wilburn Cornelia, caseworker, obtained from Social Work Progress Note on 12/29/20 at 0942. Resources to be provided prior to discharge.   Signed:  Durenda Hurt, MSW, [place], LCASA 01/05/2021 3:13 PM

## 2021-01-05 NOTE — Progress Notes (Signed)
[facility] MD Progress Note  01/05/2021 3:33 PM [name]  MRN:  [id number] Subjective:  55 year old male with history of major depressive disorder presenting with worsening depression and suicidal ideations in context of worsening health and grief. Patient had no acute events overnight, medication compliant, performing ADLS.   Patient seen one-on-one this morning, and he notes improved mood. He feels ready to go back home soon, but does not some ongoing social stressors. His water has been shut off due to inability to pay bills, and he worries his lights will be cut off soon as well. He notes that the social worker at [employer] had provided him with outpatient resources for food stamps, SSI, and bill payment assistance, and requests to speak to a Education officer, museum here. This was completed prior to the time of this note. Social workers to provide resources prior to discharge. He notes that focusing on good times with his wife has been very helpful for his mood, and he continues to plan on doing this at home. He feels that having someone to talk to and process grief could be helpful at discharge. Also discussed reaching out to friends and family for help during this transition period whether for emotional support, or financial support with bills he is having difficult paying. Time spent brain storming different people to contact, and how they may be able to help. His brother-in-law is currently helping by housing his children until utilities can be turned back on. He denies suicidal ideations, homicidal ideations, visual hallucinations, and auditory hallucinations at this time.   Principal Problem: MDD (major depressive disorder), recurrent severe, without psychosis ([place]) Diagnosis: Principal Problem:   MDD (major depressive disorder), recurrent severe, without psychosis ([place]) Active Problems:   Hypertension   Chronic combined systolic and diastolic congestive heart failure ([place])   Diabetes mellitus type  II, controlled ([place])  Total Time spent with patient: 45 minutes  Past Psychiatric History:No previous inpatient or outpatient psychiatric care. No previous medication trials aside from Prozac. No previous suicide attempts  Past Medical History:  Past Medical History:  Diagnosis Date  . Acute combined systolic and diastolic CHF, NYHA class 3 ([place])    a. 02/2014 Echo: EF 25-30%.  . Cardiomyopathy ([place])    a. 02/2014 Echo: EF 25-30%, sev glob HK with inferolat HK->AK, mod conc LVH, Gr 2 DD, Mild MR, sev dil LA.  . Depression   . DM (diabetes mellitus) ([place])   . High cholesterol   . Hypertension   . Morbid obesity ([place])   . Tobacco abuse     Past Surgical History:  Procedure Laterality Date  . CATARACT EXTRACTION Right   . LEFT HEART CATHETERIZATION WITH CORONARY ANGIOGRAM N/A 03/04/2014   Procedure: LEFT HEART CATHETERIZATION WITH CORONARY ANGIOGRAM;  Surgeon: Burnell Blanks, MD;  Location: [facility] CATH LAB;  Service: Cardiovascular;  Laterality: N/A;  . TEE WITHOUT CARDIOVERSION N/A 02/04/2017   Procedure: TRANSESOPHAGEAL ECHOCARDIOGRAM (TEE);  Surgeon: Dorothy Spark, MD;  Location: [facility] ENDOSCOPY;  Service: Cardiovascular;  Laterality: N/A;   Family History:  Family History  Problem Relation Age of Onset  . Heart attack Father        died @ 69  . Cervical cancer Mother        died @ 90   Family Psychiatric  History: Denies Social History:  Social History   Substance and Sexual Activity  Alcohol Use No     Social History   Substance and Sexual Activity  Drug  Use No    Social History   Socioeconomic History  . Marital status: Widowed    Spouse name: Not on file  . Number of children: 2  . Years of education: 70  . Highest education level: Not on file  Occupational History  . Occupation: [employer]  Tobacco Use  . Smoking status: Current Some Day Smoker    Years: 20.00    Types: Cigarettes  . Smokeless tobacco: Never Used  Vaping Use  . Vaping Use: Never  used  Substance and Sexual Activity  . Alcohol use: No  . Drug use: No  . Sexual activity: Yes  Other Topics Concern  . Not on file  Social History Narrative   Lives with 2 young children in [place].  Wife died in May 06, 2020.  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   Left-handed   Caffeine: occasional tea   Social Determinants of Radio broadcast assistant Strain: Not on file  Food Insecurity: Not on file  Transportation Needs: Not on file  Physical Activity: Not on file  Stress: Not on file  Social Connections: Not on file   Additional Social History:                         Sleep: Fair  Appetite:  Good  Current Medications: Current Facility-Administered Medications  Medication Dose Route Frequency Provider Last Rate Last Admin  . acetaminophen (TYLENOL) tablet 650 mg  650 mg Oral Q6H PRN Salley Scarlet, MD       Or  . acetaminophen (TYLENOL) suppository 650 mg  650 mg Rectal Q6H PRN Salley Scarlet, MD      . alum & mag hydroxide-simeth (MAALOX/MYLANTA) 200-200-20 MG/5ML suspension 30 mL  30 mL Oral Q4H PRN Salley Scarlet, MD      . atorvastatin (LIPITOR) tablet 80 mg  80 mg Oral Daily Salley Scarlet, MD   80 mg at 01/05/21 6803  . carvedilol (COREG) tablet 25 mg  25 mg Oral BID WC Salley Scarlet, MD   25 mg at 01/05/21 2122  . clonazePAM (KLONOPIN) tablet 0.5 mg  0.5 mg Oral QHS PRN Salley Scarlet, MD   0.5 mg at 01/04/21 2050  . dapagliflozin propanediol (FARXIGA) tablet 10 mg  10 mg Oral Daily Salley Scarlet, MD   10 mg at 01/05/21 4825  . ezetimibe (ZETIA) tablet 10 mg  10 mg Oral Daily Salley Scarlet, MD   10 mg at 01/05/21 0037  . FLUoxetine (PROZAC) capsule 40 mg  40 mg Oral Daily Salley Scarlet, MD   40 mg at 01/05/21 0488  . furosemide (LASIX) tablet 40 mg  40 mg Oral Daily Salley Scarlet, MD   40 mg at 01/05/21 8916  . ibuprofen (ADVIL) tablet 400 mg  400 mg Oral Q4H PRN Salley Scarlet, MD      .  isosorbide-hydrALAZINE (BIDIL) 20-37.5 MG per tablet 2 tablet  2 tablet Oral Q8H Salley Scarlet, MD   2 tablet at 01/05/21 [phone]  . magnesium hydroxide (MILK OF MAGNESIA) suspension 30 mL  30 mL Oral Daily PRN Salley Scarlet, MD      . polyethylene glycol (MIRALAX / GLYCOLAX) packet 17 g  17 g Oral Daily PRN Salley Scarlet, MD      . sacubitril-valsartan (ENTRESTO) 49-51 mg per tablet  1 tablet Oral BID Salley Scarlet, MD  1 tablet at 01/05/21 0927  . spironolactone (ALDACTONE) tablet 25 mg  25 mg Oral Daily Salley Scarlet, MD   25 mg at 01/05/21 [id number]  . timolol (TIMOPTIC) 0.5 % ophthalmic solution 1 drop  1 drop Both Eyes Daily Salley Scarlet, MD   1 drop at 01/05/21 [id number]    Lab Results:  Results for orders placed or performed during the hospital encounter of 12/24/20 (from the past 48 hour(s))  Resp Panel by RT-PCR (Flu A&B, Covid) Nasopharyngeal Swab     Status: None   Collection Time: 01/03/21  4:03 PM   Specimen: Nasopharyngeal Swab; Nasopharyngeal(NP) swabs in vial transport medium  Result Value Ref Range   SARS Coronavirus 2 by RT PCR NEGATIVE NEGATIVE    Comment: (NOTE) SARS-CoV-2 target nucleic acids are NOT DETECTED.  The SARS-CoV-2 RNA is generally detectable in upper respiratory specimens during the acute phase of infection. The lowest concentration of SARS-CoV-2 viral copies this assay can detect is 138 copies/mL. A negative result does not preclude SARS-Cov-2 infection and should not be used as the sole basis for treatment or other patient management decisions. A negative result may occur with  improper specimen collection/handling, submission of specimen other than nasopharyngeal swab, presence of viral mutation(s) within the areas targeted by this assay, and inadequate number of viral copies(<138 copies/mL). A negative result must be combined with clinical observations, patient history, and epidemiological information. The expected result is Negative.  Fact  Sheet for Patients:  EntrepreneurPulse.com.au  Fact Sheet for Healthcare Providers:  IncredibleEmployment.be  This test is no t yet approved or cleared by the [place] FDA and  has been authorized for detection and/or diagnosis of SARS-CoV-2 by FDA under an Emergency Use Authorization (EUA). This EUA will remain  in effect (meaning this test can be used) for the duration of the COVID-19 declaration under Section 564(b)(1) of the Act, 21 U.S.C.section 360bbb-3(b)(1), unless the authorization is terminated  or revoked sooner.       Influenza A by PCR NEGATIVE NEGATIVE   Influenza B by PCR NEGATIVE NEGATIVE    Comment: (NOTE) The Xpert Xpress SARS-CoV-2/FLU/RSV plus assay is intended as an aid in the diagnosis of influenza from Nasopharyngeal swab specimens and should not be used as a sole basis for treatment. Nasal washings and aspirates are unacceptable for Xpert Xpress SARS-CoV-2/FLU/RSV testing.  Fact Sheet for Patients: EntrepreneurPulse.com.au  Fact Sheet for Healthcare Providers: IncredibleEmployment.be  This test is not yet approved or cleared by the [place] FDA and has been authorized for detection and/or diagnosis of SARS-CoV-2 by FDA under an Emergency Use Authorization (EUA). This EUA will remain in effect (meaning this test can be used) for the duration of the COVID-19 declaration under Section 564(b)(1) of the Act, 21 U.S.C. section 360bbb-3(b)(1), unless the authorization is terminated or revoked.  Performed at [facility], [place] [address] [place], [place] [address]     Blood Alcohol level:  No results found for: [facility]  Metabolic Disorder Labs: Lab Results  Component Value Date   HGBA1C 5.5 12/27/2020   MPG 111.15 12/27/2020   MPG 142.72 07/04/2020   No results found for: PROLACTIN Lab Results  Component Value Date   CHOL 97 12/27/2020   TRIG 67 12/27/2020   HDL  35 (L) 12/27/2020   CHOLHDL 2.8 12/27/2020   VLDL 13 12/27/2020   LDLCALC 49 12/27/2020   LDLCALC 73 03/07/2018    Physical Findings: AIMS: Facial and Oral Movements Muscles of Facial  Expression: None, normal Lips and Perioral Area: None, normal Jaw: None, normal Tongue: None, normal,Extremity Movements Upper (arms, wrists, hands, fingers): None, normal Lower (legs, knees, ankles, toes): None, normal, Trunk Movements Neck, shoulders, hips: None, normal, Overall Severity Severity of abnormal movements (highest score from questions above): None, normal Incapacitation due to abnormal movements: None, normal Patient's awareness of abnormal movements (rate only patient's report): No Awareness, Dental Status Current problems with teeth and/or dentures?: No Does patient usually wear dentures?: No  CIWA:    COWS:     Musculoskeletal: Strength & Muscle Tone: within normal limits Gait & Station: normal Patient leans: N/A  Psychiatric Specialty Exam: Physical Exam Vitals and nursing note reviewed.  Constitutional:      Appearance: Normal appearance.  HENT:     Head: Normocephalic and atraumatic.     Right Ear: External ear normal.     Left Ear: External ear normal.     Nose: Nose normal.     Mouth/Throat:     Mouth: Mucous membranes are moist.     Pharynx: Oropharynx is clear.  Eyes:     Extraocular Movements: Extraocular movements intact.     Conjunctiva/sclera: Conjunctivae normal.     Pupils: Pupils are equal, round, and reactive to light.  Cardiovascular:     Rate and Rhythm: Normal rate.     Pulses: Normal pulses.  Pulmonary:     Effort: Pulmonary effort is normal.     Breath sounds: Normal breath sounds.  Abdominal:     General: Abdomen is flat.     Palpations: Abdomen is soft.  Musculoskeletal:        General: No swelling. Normal range of motion.     Cervical back: Normal range of motion and neck supple.  Skin:    General: Skin is warm and dry.  Neurological:      General: No focal deficit present.     Mental Status: He is alert and oriented to person, place, and time.  Psychiatric:        Attention and Perception: Attention and perception normal.        Mood and Affect: Affect normal. Mood is depressed.        Speech: Speech normal.        Behavior: Behavior is cooperative.        Thought Content: Thought content does not include homicidal or suicidal ideation.        Cognition and Memory: Cognition and memory normal.        Judgment: Judgment normal.     Review of Systems  Constitutional: Negative for chills and fatigue.  HENT: Negative for rhinorrhea and sore throat.   Eyes: Negative for photophobia and visual disturbance.  Respiratory: Negative for cough and shortness of breath.   Cardiovascular: Negative for chest pain and palpitations.  Gastrointestinal: Negative for constipation, diarrhea, nausea and vomiting.  Endocrine: Negative for cold intolerance and heat intolerance.  Genitourinary: Negative for difficulty urinating and dysuria.  Musculoskeletal: Negative for arthralgias and myalgias.  Skin: Negative for rash and wound.  Neurological: Positive for headaches. Negative for dizziness.  Hematological: Negative for adenopathy. Does not bruise/bleed easily.  Psychiatric/Behavioral: Positive for dysphoric mood. Negative for hallucinations, sleep disturbance and suicidal ideas. The patient is not nervous/anxious.     Blood pressure 92/72, pulse 76, temperature 97.8 F (36.6 C), temperature source Oral, resp. rate 18, height 5\' 10"  (1.778 m), weight 87.9 kg, SpO2 100 %.Body mass index is 27.81 kg/m.  General Appearance: Well  Groomed  Eye Contact:  Good  Speech:  Clear and Coherent and Normal Rate  Volume:  Normal  Mood:  Depressed  Affect:  Congruent  Thought Process:  Coherent and Linear  Orientation:  Full (Time, Place, and Person)  Thought Content:  Logical  Suicidal Thoughts:  No  Homicidal Thoughts:  No  Memory:   Immediate;   Fair Recent;   Fair Remote;   Fair  Judgement:  Intact  Insight:  Fair  Psychomotor Activity:  Normal  Concentration:  Concentration: Fair  Recall:  [employer] of Knowledge:  Fair  Language:  Fair  Akathisia:  Negative    Handed:  Right  AIMS (if indicated):     Assets:  Communication Skills Desire for Improvement Housing Resilience Social Support  ADL's:  Intact  Cognition:  WNL  Sleep:  Number of Hours: 8.15     Treatment Plan Summary: Daily contact with patient to assess and evaluate symptoms and progress in treatment and Medication management PLAN OF CARE:55 year old male with history of major depressive disorder presenting with worsening depression and suicidal ideations in context of worsening health and grief. Continue inpatient admission and home medications for heart failure. Continue Prozac 40 mg daily for depression.  Salley Scarlet, MD 01/05/2021, 3:33 PM

## 2021-01-05 NOTE — Progress Notes (Signed)
Recreation Therapy Notes  INPATIENT RECREATION THERAPY ASSESSMENT  Patient Details Name: Masahiro Iglesia. MRN: [id number] DOB: Jun 30, 1966 Today's Date: 01/05/2021       Information Obtained From: Patient  Able to Participate in Assessment/Interview: Yes  Patient Presentation: Responsive  Reason for Admission (Per Patient): Active Symptoms  Patient Stressors: Death  Coping Skills:   Read,TV  Leisure Interests (2+):  Individual - TV,Social - Family,Individual - Reading,Games - Cards,Games - Board games,Sports - Other (Comment) ([place])  Frequency of Recreation/Participation: Monthly  Awareness of Community Resources:  Yes  Community Resources:  [employer]  Current Use: Yes  If no, Barriers?:    Expressed Interest in [employer] Information: No  County of Residence:  [place]  Patient Main Form of Transportation: Other (Comment) (Family/Friends)  Patient Strengths:  Friendly, Hopeful  Patient Identified Areas of Improvement:  My attitude, Being more understanding  Patient Goal for Hospitalization:  Work on my anxiety and get over the paasing of my wife.  Current SI (including self-harm):  No  Current HI:  No  Current AVH: No  Staff Intervention Plan: Group Attendance,Collaborate with Interdisciplinary Treatment Team  Consent to Intern Participation: N/A  Adayah Arocho 01/05/2021, 12:29 PM

## 2021-01-05 NOTE — Progress Notes (Signed)
Recreation Therapy Notes  Date: 01/05/2021  Time: 9:30 am   Location: Craft room   Behavioral response: Appropriate  Intervention Topic: Self-esteem    Discussion/Intervention:  Group content today was focused on Self-Care. The group defined self-care and some positive ways they care for themselves. Individuals expressed ways and reasons why they neglected any self-care in the past. Patients described ways to improve self-care in the future. The group explained what could happen if they did not do any self-care activities at all. The group participated in the intervention "self-care assessment" where they had a chance to discover some of their weaknesses and strengths in self- care. Patient came up with a self-care plan to improve themselves in the future.  Clinical Observations/Feedback: Patient came to group late due to unknown reasons. He stated that self-esteem is thinking positive. Individual was social with peers and staff while participating in the intervention. Gagan Dillion LRT/CTRS          Lulamae Skorupski 01/05/2021 11:44 AM

## 2021-01-06 ENCOUNTER — Other Ambulatory Visit: Payer: Self-pay | Admitting: Behavioral Health

## 2021-01-06 DIAGNOSIS — F332 Major depressive disorder, recurrent severe without psychotic features: Secondary | ICD-10-CM | POA: Diagnosis not present

## 2021-01-06 MED ORDER — FLUOXETINE HCL 40 MG PO CAPS: 40.0000 mg | ORAL_CAPSULE | Freq: Every day | ORAL | 1 refills | Status: DC

## 2021-01-06 NOTE — BHH Counselor (Signed)
CSW provided the patient with information on [facility], including address and how to sign up for food stamps, Medicaid and energy assistance. CSW also provided the web addresses to get this information.   Patient reported that he did not have access to a computer.  CSW provided the patient with the paper copies of the applications.  CSW also provided the patient with written step by step instructions if he were able to get a family member or peer to assist with Internet use.  Assunta Curtis, MSW, LCSW 01/06/2021 12:42 PM

## 2021-01-06 NOTE — Discharge Summary (Signed)
Physician Discharge Summary Note  Patient:  Matthew [name] is an 55 y.o., male MRN:  [id number] DOB:  1966-10-25 Patient phone:  [phone] (home)  Patient address:   [address] [place] [address],  Total Time spent with patient: 30 minutes  Date of Admission:  01/03/2021 Date of Discharge: 01/06/2021  Reason for Admission: 55 year old male with history of major depressive disorder presenting with worsening depression and suicidal ideations in context of worsening health and grief.  Principal Problem: MDD (major depressive disorder), recurrent severe, without psychosis ([place]) Discharge Diagnoses: Principal Problem:   MDD (major depressive disorder), recurrent severe, without psychosis ([place]) Active Problems:   Hypertension   Chronic combined systolic and diastolic congestive heart failure ([place])   Diabetes mellitus type II, controlled ([place])   Past Psychiatric History: No previous inpatient or outpatient psychiatric care. No previous medication trials aside from Prozac. No previous suicide attempts.   Past Medical History:  Past Medical History:  Diagnosis Date  . Acute combined systolic and diastolic CHF, NYHA class 3 ([place])    a. 02/2014 Echo: EF 25-30%.  . Cardiomyopathy ([place])    a. 02/2014 Echo: EF 25-30%, sev glob HK with inferolat HK->AK, mod conc LVH, Gr 2 DD, Mild MR, sev dil LA.  . Depression   . DM (diabetes mellitus) ([place])   . High cholesterol   . Hypertension   . Morbid obesity ([place])   . Tobacco abuse     Past Surgical History:  Procedure Laterality Date  . CATARACT EXTRACTION Right   . LEFT HEART CATHETERIZATION WITH CORONARY ANGIOGRAM N/A 03/04/2014   Procedure: LEFT HEART CATHETERIZATION WITH CORONARY ANGIOGRAM;  Surgeon: Burnell Blanks, MD;  Location: [facility] CATH LAB;  Service: Cardiovascular;  Laterality: N/A;  . TEE WITHOUT CARDIOVERSION N/A 02/04/2017   Procedure: TRANSESOPHAGEAL ECHOCARDIOGRAM (TEE);  Surgeon: Dorothy Spark,  MD;  Location: [facility] ENDOSCOPY;  Service: Cardiovascular;  Laterality: N/A;   Family History:  Family History  Problem Relation Age of Onset  . Heart attack Father        died @ 32  . Cervical cancer Mother        died @ 70   Family Psychiatric  History: Denies Social History:  Social History   Substance and Sexual Activity  Alcohol Use No     Social History   Substance and Sexual Activity  Drug Use No    Social History   Socioeconomic History  . Marital status: Widowed    Spouse name: Not on file  . Number of children: 2  . Years of education: 1  . Highest education level: Not on file  Occupational History  . Occupation: [employer]  Tobacco Use  . Smoking status: Current Some Day Smoker    Years: 20.00    Types: Cigarettes  . Smokeless tobacco: Never Used  Vaping Use  . Vaping Use: Never used  Substance and Sexual Activity  . Alcohol use: No  . Drug use: No  . Sexual activity: Yes  Other Topics Concern  . Not on file  Social History Narrative   Lives with 2 young children in [place].  Wife died in 2020/05/07.  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   Left-handed   Caffeine: occasional tea   Social Determinants of Health   Financial Resource Strain: Not on file  Food Insecurity: Not on file  Transportation Needs: Not on file  Physical Activity: Not on  file  Stress: Not on file  Social Connections: Not on file    Hospital Course:  55 year old male with history of major depressive disorder presented with worsening depression and suicidal ideations in context of worsening health and grief. His wife passed away unexpectedly last 2023/03/08, and patient has experienced difficulty with bereavement and grief. He notes it is difficult to not feel guilty over her death for not advocating for her to address her own health concerns. He notes he was also hospitalized for heart failure at the time, and wonders if he had been home if things would be  different now. Since she has passed, he often struggles to pay his bills as well. He note she was much better with money, and handled all the bills previously, and also contributed to their household income. He feels the holidays were also a big trigger for depression as Christmas was her favorite holiday. He has not been able to keep his appointments or fill his prescriptions on time, and feels that depression is contributing to his increase in heart failure exacerbations. While on the unit, Prozac increased to 40 mg daily. Provided brief grief counseling and supportive psychotherapy on the unit. He was also provided with list of numbers to call for financial assistance, SSI, food stamps, and transportation to appointments prior to discharge. He denied suicidal ideations, homicidal ideations, visual hallucinations, and auditory hallucinations while in the hospital. At time of discharge patient and treatment team felt he was safe for discharge with outpatient follow-up.   Physical Findings: AIMS: Facial and Oral Movements Muscles of Facial Expression: None, normal Lips and Perioral Area: None, normal Jaw: None, normal Tongue: None, normal,Extremity Movements Upper (arms, wrists, hands, fingers): None, normal Lower (legs, knees, ankles, toes): None, normal, Trunk Movements Neck, shoulders, hips: None, normal, Overall Severity Severity of abnormal movements (highest score from questions above): None, normal Incapacitation due to abnormal movements: None, normal Patient's awareness of abnormal movements (rate only patient's report): No Awareness, Dental Status Current problems with teeth and/or dentures?: No Does patient usually wear dentures?: No  CIWA:    COWS:     Musculoskeletal: Strength & Muscle Tone: within normal limits Gait & Station: normal Patient leans: N/A  Psychiatric Specialty Exam: Physical Exam Vitals and nursing note reviewed.  Constitutional:      Appearance: Normal  appearance.  HENT:     Head: Normocephalic and atraumatic.     Right Ear: External ear normal.     Left Ear: External ear normal.     Nose: Nose normal.     Mouth/Throat:     Mouth: Mucous membranes are moist.     Pharynx: Oropharynx is clear.  Eyes:     Extraocular Movements: Extraocular movements intact.     Conjunctiva/sclera: Conjunctivae normal.     Pupils: Pupils are equal, round, and reactive to light.  Cardiovascular:     Rate and Rhythm: Normal rate.     Pulses: Normal pulses.  Pulmonary:     Effort: Pulmonary effort is normal.     Breath sounds: Normal breath sounds.  Abdominal:     General: Abdomen is flat.     Palpations: Abdomen is soft.  Musculoskeletal:        General: No swelling. Normal range of motion.     Cervical back: Normal range of motion and neck supple.  Skin:    General: Skin is warm and dry.  Neurological:     General: No focal deficit present.  Mental Status: He is alert and oriented to person, place, and time.  Psychiatric:        Mood and Affect: Mood normal.        Behavior: Behavior normal.        Thought Content: Thought content normal.        Judgment: Judgment normal.     Review of Systems  Constitutional: Negative for appetite change and fatigue.  HENT: Negative for rhinorrhea and sore throat.   Eyes: Negative for pain and visual disturbance.  Respiratory: Negative for cough and shortness of breath.   Cardiovascular: Negative for chest pain, palpitations and leg swelling.  Gastrointestinal: Negative for constipation, diarrhea, nausea and vomiting.  Endocrine: Negative for cold intolerance and heat intolerance.  Genitourinary: Negative for difficulty urinating and dysuria.  Musculoskeletal: Negative for arthralgias and myalgias.  Skin: Negative for rash and wound.  Allergic/Immunologic: Negative for environmental allergies and food allergies.  Neurological: Positive for headaches. Negative for dizziness.  Hematological: Negative  for adenopathy. Does not bruise/bleed easily.  Psychiatric/Behavioral: Negative for hallucinations, sleep disturbance and suicidal ideas.    Blood pressure (!) 149/95, pulse (!) 47, temperature 97.8 F (36.6 C), temperature source Oral, resp. rate 17, height 5\' 10"  (1.778 m), weight 87.9 kg, SpO2 100 %.Body mass index is 27.81 kg/m.  General Appearance: Well Groomed  Engineer, water::  Good  Speech:  Clear and Coherent and Normal Rate  Volume:  Normal  Mood:  Euthymic  Affect:  Congruent  Thought Process:  Coherent and Linear  Orientation:  Full (Time, Place, and Person)  Thought Content:  Logical  Suicidal Thoughts:  No  Homicidal Thoughts:  No  Memory:  Immediate;   Fair Recent;   Fair Remote;   Fair  Judgement:  Intact  Insight:  Fair  Psychomotor Activity:  Normal  Concentration:  Fair  Recall:  [place] of Knowledge:Fair  Language: Fair  Akathisia:  Negative  Handed:  Right  AIMS (if indicated):     Assets:  Communication Skills Desire for Improvement Housing Resilience Social Support Talents/Skills Transportation Vocational/Educational  Sleep:  Number of Hours: 6.75  Cognition: WNL  ADL's:  Intact        Have you used any form of tobacco in the last 30 days? (Cigarettes, Smokeless Tobacco, Cigars, and/or Pipes): No  Has this patient used any form of tobacco in the last 30 days? (Cigarettes, Smokeless Tobacco, Cigars, and/or Pipes) No  Blood Alcohol level:  No results found for: [facility]  Metabolic Disorder Labs:  Lab Results  Component Value Date   HGBA1C 5.5 12/27/2020   MPG 111.15 12/27/2020   MPG 142.72 07/04/2020   No results found for: PROLACTIN Lab Results  Component Value Date   CHOL 97 12/27/2020   TRIG 67 12/27/2020   HDL 35 (L) 12/27/2020   CHOLHDL 2.8 12/27/2020   VLDL 13 12/27/2020   LDLCALC 49 12/27/2020   [place] 73 03/07/2018    See Psychiatric Specialty Exam and Suicide Risk Assessment completed by Attending Physician prior to  discharge.  Discharge destination:  Home   Is patient on multiple antipsychotic therapies at discharge:  No   Has Patient had three or more failed trials of antipsychotic monotherapy by history:  No  Recommended Plan for Multiple Antipsychotic Therapies: NA  Discharge Instructions    Diet - low sodium heart healthy   Complete by: As directed    Increase activity slowly   Complete by: As directed  Allergies as of 01/06/2021   No Known Allergies     Medication List    TAKE these medications     Indication  aspirin 81 MG tablet Take 1 tablet (81 mg total) by mouth daily.  Indication: Disease of the Peripheral Arteries   atorvastatin 80 MG tablet Commonly known as: LIPITOR Take 1 tablet (80 mg total) by mouth daily.  Indication: High Amount of Fats in the Blood   carvedilol 25 MG tablet Commonly known as: COREG Take 1 tablet (25 mg total) by mouth 2 (two) times daily with a meal.  Indication: Cardiac Failure   clonazePAM 0.5 MG tablet Commonly known as: KLONOPIN Take 1 tablet (0.5 mg total) by mouth at bedtime as needed (sleep, anxiety).  Indication: Feeling Anxious   dapagliflozin propanediol 10 MG Tabs tablet Commonly known as: FARXIGA Take 1 tablet (10 mg total) by mouth daily.  Indication: Type 2 Diabetes   ezetimibe 10 MG tablet Commonly known as: ZETIA Take 1 tablet (10 mg total) by mouth daily.  Indication: High Amount of Fats in the Blood   FLUoxetine 40 MG capsule Commonly known as: PROZAC Take 1 capsule (40 mg total) by mouth daily. What changed:   medication strength  how much to take  Indication: Major Depressive Disorder   furosemide 40 MG tablet Commonly known as: LASIX Take 1 tablet (40 mg total) by mouth daily.  Indication: High Blood Pressure Disorder   glucose blood test strip Commonly known as: ONE TOUCH ULTRA TEST Use as instructed  Indication: FSBS   ibuprofen 400 MG tablet Commonly known as: ADVIL Take 1 tablet (400 mg  total) by mouth every 6 (six) hours as needed for headache or moderate pain.  Indication: Pain   isosorbide-hydrALAZINE 20-37.5 MG tablet Commonly known as: BIDIL Take 2 tablets by mouth every 8 (eight) hours. What changed: when to take this  Indication: Cardiac Failure   sacubitril-valsartan 49-51 MG Commonly known as: ENTRESTO Take 1 tablet by mouth 2 (two) times daily.  Indication: Cardiac Failure   spironolactone 25 MG tablet Commonly known as: ALDACTONE Take 1 tablet (25 mg total) by mouth daily.  Indication: Diastolic Heart Failure   timolol 0.5 % ophthalmic solution Commonly known as: TIMOPTIC Place 1 drop into both eyes daily after breakfast.  Indication: Angle-Closure Glaucoma       Follow-up Information    [facility] Follow up on 01/18/2021.   Specialty: Cardiology Why: at 11:00am. Enter through the [place] entrance Contact information: [place] Follow up.   Specialty: Behavioral Health Why: Please go to the [facility] on the second floor between [place] for walk-in therapy appointment.  Contact information: [place] [phone]              Follow-up recommendations:  Activity:  as tolerated Diet:  low sodium, heart-healthy diet  Comments:  30-day script with 1 refill sent to the [facility] in [place] per patient request. Patient also given a 7-day supply of free medication at discharge.   Signed: Salley Scarlet, MD 01/06/2021, 10:22 AM

## 2021-01-06 NOTE — Progress Notes (Signed)
Recreation Therapy Notes  Date: 01/06/2021  Time: 9:30 am   Location: Craft room     Behavioral response: N/A   Intervention Topic: Communication    Discussion/Intervention: Patient did not attend group.   Clinical Observations/Feedback:  Patient did not attend group.   Taylan Marez LRT/CTRS        [place] 01/06/2021 11:46 AM

## 2021-01-06 NOTE — Progress Notes (Signed)
Recreation Therapy Notes  INPATIENT RECREATION TR PLAN  Patient Details Name: Danyel Griess. MRN: [id number] DOB: 22-May-1966 Today's Date: 01/06/2021  Rec Therapy Plan Is patient appropriate for Therapeutic Recreation?: Yes Treatment times per week: At least 3 Estimated Length of Stay: 5-7 days TR Treatment/Interventions: Group participation (Comment)  Discharge Criteria Pt will be discharged from therapy if:: Discharged Treatment plan/goals/alternatives discussed and agreed upon by:: Patient/family  Discharge Summary Short term goals set: Patient will engage in groups without prompting or encouragement from LRT x3 group sessions within 5 recreation therapy group sessions Short term goals met: Not met Progress toward goals comments: Groups attended Which groups?: Self-esteem Reason goals not met: Patient spent most of his time in his room Therapeutic equipment acquired: N/A Reason patient discharged from therapy: Discharge from hospital Pt/family agrees with progress & goals achieved: Yes Date patient discharged from therapy: 01/06/21   Rober Skeels 01/06/2021, 11:55 AM

## 2021-01-06 NOTE — Progress Notes (Signed)
Patient denies SI/HI, denies A/V hallucinations. Patient verbalizes understanding of discharge instructions, follow up care and prescriptions. 7 days medicines given to patient.Patient given all belongings from [facility] locker. Patient escorted out by staff, transported by safe transport.

## 2021-01-06 NOTE — Progress Notes (Signed)
  [facility] Adult Case Management Discharge Plan :  Will you be returning to the same living situation after discharge:  Yes,  pt reports that he is returning home. At discharge, do you have transportation home?: Yes,  CSW will assist with transportation needs. Do you have the ability to pay for your medications: No.  Release of information consent forms completed and in the chart;  Patient's signature needed at discharge.  Patient to Follow up at:  Follow-up Information    [facility] Follow up on 01/18/2021.   Specialty: Cardiology Why: at 11:00am. Enter through the [place] entrance Contact information: [place] Follow up.   Specialty: Behavioral Health Why: Please go to the [facility] on the second floor between [place] for walk-in therapy appointment.  Contact information: [place] [phone]              Next level of care provider has access to [place] and Suicide Prevention discussed: Yes,  SPE completed with the patient.   Have you used any form of tobacco in the last 30 days? (Cigarettes, Smokeless Tobacco, Cigars, and/or Pipes): No  Has patient been referred to the Quitline?: Patient refused referral  Patient has been referred for addiction treatment: Pt. refused referral  Rozann Lesches, LCSW 01/06/2021, 10:00 AM

## 2021-01-06 NOTE — BHH Suicide Risk Assessment (Signed)
[facility] Discharge Suicide Risk Assessment   Principal Problem: MDD (major depressive disorder), recurrent severe, without psychosis ([place]) Discharge Diagnoses: Principal Problem:   MDD (major depressive disorder), recurrent severe, without psychosis ([place]) Active Problems:   Hypertension   Chronic combined systolic and diastolic congestive heart failure ([place])   Diabetes mellitus type II, controlled ([place])   Total Time spent with patient: 30 minutes  Musculoskeletal: Strength & Muscle Tone: within normal limits Gait & Station: normal Patient leans: N/A  Psychiatric Specialty Exam: Review of Systems  Constitutional: Negative for appetite change and fatigue.  HENT: Negative for rhinorrhea and sore throat.   Eyes: Negative for pain and visual disturbance.  Respiratory: Negative for cough and shortness of breath.   Cardiovascular: Negative for chest pain, palpitations and leg swelling.  Gastrointestinal: Negative for constipation, diarrhea, nausea and vomiting.  Endocrine: Negative for cold intolerance and heat intolerance.  Genitourinary: Negative for difficulty urinating and dysuria.  Musculoskeletal: Negative for arthralgias and myalgias.  Skin: Negative for rash and wound.  Allergic/Immunologic: Negative for environmental allergies and food allergies.  Neurological: Positive for headaches. Negative for dizziness.  Hematological: Negative for adenopathy. Does not bruise/bleed easily.  Psychiatric/Behavioral: Negative for hallucinations, sleep disturbance and suicidal ideas.    Blood pressure (!) 149/95, pulse (!) 47, temperature 97.8 F (36.6 C), temperature source Oral, resp. rate 17, height 5\' 10"  (1.778 m), weight 87.9 kg, SpO2 100 %.Body mass index is 27.81 kg/m.  General Appearance: Well Groomed  Engineer, water::  Good  Speech:  Clear and Coherent and Normal Rate  Volume:  Normal  Mood:  Euthymic  Affect:  Congruent  Thought Process:  Coherent and Linear  Orientation:  Full  (Time, Place, and Person)  Thought Content:  Logical  Suicidal Thoughts:  No  Homicidal Thoughts:  No  Memory:  Immediate;   Fair Recent;   Fair Remote;   Fair  Judgement:  Intact  Insight:  Fair  Psychomotor Activity:  Normal  Concentration:  Fair  Recall:  [employer] of Knowledge:Fair  Language: Fair  Akathisia:  Negative  Handed:  Right  AIMS (if indicated):     Assets:  Communication Skills Desire for Improvement Housing Resilience Social Support Talents/Skills Transportation Vocational/Educational  Sleep:  Number of Hours: 6.75  Cognition: WNL  ADL's:  Intact   Mental Status Per Nursing Assessment::   On Admission:  Suicidal ideation indicated by patient  Demographic Factors:  Male and Divorced or widowed  Loss Factors: Financial problems/change in socioeconomic status  Historical Factors: NA  Risk Reduction Factors:   Responsible for children under 4 years of age, Sense of responsibility to family, Religious beliefs about death, Employed, Living with another person, especially a relative, Positive social support, Positive therapeutic relationship and Positive coping skills or problem solving skills  Continued Clinical Symptoms:  Depression:   Recent sense of peace/wellbeing Previous Psychiatric Diagnoses and Treatments Medical Diagnoses and Treatments/Surgeries  Cognitive Features That Contribute To Risk:  None    Suicide Risk:  Minimal: No identifiable suicidal ideation.  Patients presenting with no risk factors but with morbid ruminations; may be classified as minimal risk based on the severity of the depressive symptoms   Follow-up Information    [facility] Follow up on 01/18/2021.   Specialty: Cardiology Why: at 11:00am. Enter through the [place] entrance Contact information: [place]  [facility] Follow up.   Specialty: Behavioral Health Why: Please go to the [facility] on the second floor between [place] for walk-in therapy appointment.  Contact information: [place] [phone]              Plan Of Care/Follow-up recommendations:  Activity:  as tolerated Diet:  low sodium, heart healthy diet  Salley Scarlet, MD 01/06/2021, 10:19 AM

## 2021-01-13 ENCOUNTER — Telehealth (HOSPITAL_COMMUNITY): Payer: Self-pay

## 2021-01-13 NOTE — Telephone Encounter (Signed)
Mail sent to address listed in chart as both phone numbers listed are not in service at this time. Trying to reach patient to set up appointment regarding [facility] clinic as I spoke with patient during last hospitalization then to [facility]. Pt has many SDoH to address and medication optimization in regards to HF and cost of meds as pt does not have insurance. Concern for patient being lost to follow up. Will continue to try to reach patient.   Pricilla Holm, RN, BSN Heart Failure Nurse Navigator [facility] Team [phone]

## 2021-01-17 MED FILL — FUROSEMIDE 40 MG TAB: 40 | 30 days supply | Qty: 30 | Fill #0

## 2021-01-17 NOTE — Progress Notes (Deleted)
   Patient ID: Matthew Heman., male    DOB: 04/18/1966, 55 y.o.   MRN: [id number]  HPI  Matthew [name] is a 55 y/o male with a history of   Echo report 12/24/20 reviewed and showed an EF of <20% along with mild Matthew.   Admitted 01/03/21 due to worsening depression and suicidal thoughts. Wife suddenly passed away 03-17-2020.Medication adjustment made and he was released after 3 days. Admitted 12/24/20 due to acute HF. Initially given IV lasix with transition to oral diuretics. Expressed suicidal thoughts with a plan.        Cardiology and psychiatry consults obtained. Transferred to inpatient psychiatry after 10 days.   He presents today for his initial visit with a chief complaint of   Review of Systems    Physical Exam  Assessment & Plan:  1: Chronic heart failure with reduced ejection fraction- - NYHA class - BNP 12/24/20 was 647.2  2: HTN- - BP - saw PCP ([facility]) 02/23/20 - BMP 01/03/21 reviewed and showed sodium 137, potassium 4.1, creatinine 1.44 and GFR 58  3: DM- - A1c 12/27/20 was 5.5%  4: Depression-

## 2021-01-18 ENCOUNTER — Ambulatory Visit: Payer: Medicaid Other | Admitting: Family

## 2021-01-18 ENCOUNTER — Telehealth: Payer: Self-pay | Admitting: Family

## 2021-01-18 NOTE — Telephone Encounter (Signed)
Patient did not show for his Heart Failure Clinic appointment on 01/18/21. Will attempt to reschedule.

## 2021-01-20 ENCOUNTER — Telehealth (HOSPITAL_COMMUNITY): Payer: Self-pay

## 2021-01-20 NOTE — Telephone Encounter (Signed)
Pt called from letter sent last week regarding scheduling follow up appointment with [facility] clinic. Pt does not have working phone at this time, called from friends phone. Still at current address on file.   Pt agreeable and appreciative of scheduling appt at 9AM on 2/8 Tuesday. Will have pharmacy and CSW see patient at appt as well as medical provider. Asked patient to bring in all meds.   -will set up [facility] transport.  Pricilla Holm, RN, BSN Heart Failure Nurse Navigator [facility] Team [phone]

## 2021-01-23 ENCOUNTER — Telehealth: Payer: Self-pay | Admitting: Family

## 2021-01-23 NOTE — Progress Notes (Addendum)
[facility]  PCP: Charlott Rakes Primary Cardiologist: Larae Grooms  HPI:  Matthew [name].  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     On chart review he was first admitted with heart failure symptoms in 2015 in the setting of long standing uncontrolled hypertension and smoking.  Had a LHC with completely normal coronaries.    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  Was not really followed by cardiology or hospitalized for CHF during this time period.    Beginning in 02/29/20 he began having CHF exacerbations running out of meds between admissions.  Left AMA in 03/01/2023.  Another similar admission in July, he was out of his medications at that time as well.    Most recently he was admitted 12/2020 with CHF exacerbation after running out of his medications.  He was diuresed, put on an excellent medication regimen.  He also endorsed suicidal ideation at that time and was subsequently admitted to behavioral health.    ECHO 2014-02-28:   1. Left ventricular ejection fraction, by estimation, is <20%. The left  ventricle has severely decreased function. The left ventricle demonstrates  global hypokinesis. The left ventricular internal cavity size was  moderately dilated. Left ventricular  diastolic parameters are consistent with Grade III diastolic dysfunction  (restrictive).   2. Right ventricular systolic function is moderately reduced. The right  ventricular size is normal. There is normal pulmonary artery systolic  pressure.   3. Left atrial size was severely dilated.   4. The mitral valve is normal in structure. Mild mitral valve  regurgitation. No evidence of mitral  stenosis.   5. The aortic valve is tricuspid. Aortic valve regurgitation is not  visualized. No aortic stenosis is present.   6. Aortic dilatation noted. There is mild dilatation of the ascending  aorta, measuring 38 mm.   7. The inferior vena cava is dilated in size with >50% respiratory  variability, suggesting right atrial pressure of 8 mmHg.   Has been rationing his medications completely out of spiro and carvedilol.  Took his bidil today and took entresto took last dose of lasix yesterday.  Feeling short of breath currently with minimal exertion.  Says he weighs daily says it has been between 192 and 195lbs.  Has a cpap at home and is using it.  No longer smoking quit after Thanksgiving last year.   Has his good days and bad since his wife passed away in March 01, 2023 of last year.      ROS: All systems negative except as listed in HPI, PMH and Problem List.  SH:  Social History   Socioeconomic History  . Marital status: Widowed    Spouse name: Not on file  . Number of children: 2  . Years of education: 76  . Highest education level: Not on file  Occupational History  . Occupation: [employer]  Tobacco Use  . Smoking status: Former Smoker    Years: 20.00    Types: Cigarettes  . Smokeless tobacco: Former Systems developer    Quit date: 11/14/2020  Vaping Use  . Vaping Use: Never used  Substance and Sexual Activity  . Alcohol use: No  . Drug use: No  . Sexual activity: Yes  Other Topics Concern  . Not on file  Social History Narrative   Lives with 2 young children in [place].  Wife died in 2020-04-30.  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   Left-handed   Caffeine: occasional tea   Social Determinants of Health   Financial Resource Strain: High Risk  . Difficulty of Paying Living Expenses: Very hard  Food Insecurity: Food Insecurity Present  . Worried About Charity fundraiser in the Last Year: Sometimes true  . Ran Out of Food in the Last Year: Sometimes true   Transportation Needs: Unmet Transportation Needs  . Lack of Transportation (Medical): Yes  . Lack of Transportation (Non-Medical): Yes  Physical Activity: Not on file  Stress: Stress Concern Present  . Feeling of Stress : Very much  Social Connections: Not on file  Intimate Partner Violence: Not on file    FH:  Family History  Problem Relation Age of Onset  . Heart attack Father        died @ 65  . Cervical cancer Mother        died @ 87    Past Medical History:  Diagnosis Date  . Acute combined systolic and diastolic CHF, NYHA class 3 ([place])    a. 02/2014 Echo: EF 25-30%.  . Cardiomyopathy ([place])    a. 02/2014 Echo: EF 25-30%, sev glob HK with inferolat HK->AK, mod conc LVH, Gr 2 DD, Mild MR, sev dil LA.  . Depression   . DM (diabetes mellitus) ([place])   . High cholesterol   . Hypertension   . Morbid obesity ([place])   . Tobacco abuse     Current Outpatient Medications  Medication Sig Dispense Refill  . atorvastatin (LIPITOR) 80 MG tablet Take 1 tablet (80 mg total) by mouth daily. 7 tablet 0  . dapagliflozin propanediol (FARXIGA) 10 MG TABS tablet Take 1 tablet (10 mg total) by mouth daily. 90 tablet 3  . ezetimibe (ZETIA) 10 MG tablet Take 1 tablet (10 mg total) by mouth daily. 7 tablet 0  . FLUoxetine (PROZAC) 40 MG capsule Take 1 capsule (40 mg total) by mouth daily. 30 capsule 1  . furosemide (LASIX) 40 MG tablet Take 1 tablet (40 mg total) by mouth daily. 7 tablet 0  . glucose blood (ONE TOUCH ULTRA TEST) test strip Use as instructed 30 each 12  . ibuprofen (ADVIL) 400 MG tablet Take 1 tablet (400 mg total) by mouth every 6 (six) hours as needed for headache or moderate pain. 30 tablet 0  . isosorbide-hydrALAZINE (BIDIL) 20-37.5 MG tablet Take 2 tablets by mouth every 8 (eight) hours. 42 tablet 0  . sacubitril-valsartan (ENTRESTO) 49-51 MG Take 1 tablet by mouth 2 (two) times daily. 14 tablet 0  . timolol (TIMOPTIC) 0.5 % ophthalmic solution Place 1 drop into both eyes  daily after breakfast.     No current facility-administered medications for this encounter.    Vitals:   01/24/21 1015  BP: 132/78  Pulse: 63  SpO2: 98%  Weight: 89.4 kg (197 lb)    PHYSICAL EXAM: Cardiac: JVD flat, normal rate and rhythm, clear s1 and s2, no murmurs, rubs or gallops, no LE edema Pulmonary: CTAB, not in distress Abdominal: non distended abdomen, soft and nontender Psych: Alert, conversant, in good spirits   ASSESSMENT & PLAN:  Combined systolic and diastolic CHF Biventricular failure, Nonischemic Cardiomyopathy: -Thought to be 2/2 poorly controlled HTN -NL coronaries by [facility] 2015 -Also cocaine +  on 12/2020 -HIV neg and TSH wnl as of 12/2020 -NYHA Class III symptoms -Was on a good regimen when leaving the hospital 01/2021, confirmed by [facility] he was taking all these while waiting for [facility] placement: ACEI/ARB/ARNI: Entresto 49/51 BID, BB: Carvedilol 25mg  BID, SGLT2i: Farxiga 10mg  , MRA: Spironolactone 25mg  Hydral/Nitrates: Bidil: 40-75 TID. Also on lasix 40mg  daily. -pharmacy and social work making sure he has continued access to meds getting his meds to bedside for today -taking intermittently and rationing, restart these meds, will need follow up ECHO after being on these meds consistently. 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

## 2021-01-23 NOTE — Telephone Encounter (Signed)
Unable to reach patient in attempt to reschedule a missed new patient CHF CLinic appointment.   Arnell Mausolf, NT

## 2021-01-24 ENCOUNTER — Ambulatory Visit (HOSPITAL_COMMUNITY)
Admission: RE | Admit: 2021-01-24 | Discharge: 2021-01-24 | Disposition: A | Discharge status: Home / Self Care | Payer: Self-pay | Source: Ambulatory Visit | Attending: Internal Medicine | Admitting: Internal Medicine

## 2021-01-24 ENCOUNTER — Encounter (HOSPITAL_COMMUNITY): Payer: Self-pay

## 2021-01-24 ENCOUNTER — Other Ambulatory Visit: Payer: Self-pay | Admitting: Cardiology

## 2021-01-24 ENCOUNTER — Encounter (HOSPITAL_COMMUNITY): Payer: Medicaid Other

## 2021-01-24 ENCOUNTER — Telehealth (HOSPITAL_COMMUNITY): Payer: Self-pay

## 2021-01-24 ENCOUNTER — Other Ambulatory Visit: Payer: Self-pay

## 2021-01-24 ENCOUNTER — Other Ambulatory Visit (HOSPITAL_COMMUNITY): Payer: Self-pay | Admitting: Cardiology

## 2021-01-24 VITALS — BP 132/78 | HR 63 | Wt 197.0 lb

## 2021-01-24 DIAGNOSIS — E1121 Type 2 diabetes mellitus with diabetic nephropathy: Secondary | ICD-10-CM

## 2021-01-24 DIAGNOSIS — F329 Major depressive disorder, single episode, unspecified: Secondary | ICD-10-CM | POA: Insufficient documentation

## 2021-01-24 DIAGNOSIS — Z7984 Long term (current) use of oral hypoglycemic drugs: Secondary | ICD-10-CM | POA: Insufficient documentation

## 2021-01-24 DIAGNOSIS — E782 Mixed hyperlipidemia: Secondary | ICD-10-CM

## 2021-01-24 DIAGNOSIS — E1122 Type 2 diabetes mellitus with diabetic chronic kidney disease: Secondary | ICD-10-CM | POA: Insufficient documentation

## 2021-01-24 DIAGNOSIS — E785 Hyperlipidemia, unspecified: Secondary | ICD-10-CM | POA: Insufficient documentation

## 2021-01-24 DIAGNOSIS — I428 Other cardiomyopathies: Secondary | ICD-10-CM | POA: Insufficient documentation

## 2021-01-24 DIAGNOSIS — I5042 Chronic combined systolic (congestive) and diastolic (congestive) heart failure: Secondary | ICD-10-CM

## 2021-01-24 DIAGNOSIS — F1410 Cocaine abuse, uncomplicated: Secondary | ICD-10-CM | POA: Insufficient documentation

## 2021-01-24 DIAGNOSIS — G4733 Obstructive sleep apnea (adult) (pediatric): Secondary | ICD-10-CM

## 2021-01-24 DIAGNOSIS — I5082 Biventricular heart failure: Secondary | ICD-10-CM | POA: Insufficient documentation

## 2021-01-24 DIAGNOSIS — E7800 Pure hypercholesterolemia, unspecified: Secondary | ICD-10-CM | POA: Insufficient documentation

## 2021-01-24 DIAGNOSIS — Z79899 Other long term (current) drug therapy: Secondary | ICD-10-CM | POA: Insufficient documentation

## 2021-01-24 DIAGNOSIS — N182 Chronic kidney disease, stage 2 (mild): Secondary | ICD-10-CM

## 2021-01-24 DIAGNOSIS — Z8673 Personal history of transient ischemic attack (TIA), and cerebral infarction without residual deficits: Secondary | ICD-10-CM

## 2021-01-24 DIAGNOSIS — Z87891 Personal history of nicotine dependence: Secondary | ICD-10-CM | POA: Insufficient documentation

## 2021-01-24 DIAGNOSIS — I130 Hypertensive heart and chronic kidney disease with heart failure and stage 1 through stage 4 chronic kidney disease, or unspecified chronic kidney disease: Secondary | ICD-10-CM | POA: Insufficient documentation

## 2021-01-24 DIAGNOSIS — F0631 Mood disorder due to known physiological condition with depressive features: Secondary | ICD-10-CM

## 2021-01-24 DIAGNOSIS — F17200 Nicotine dependence, unspecified, uncomplicated: Secondary | ICD-10-CM

## 2021-01-24 DIAGNOSIS — Z8249 Family history of ischemic heart disease and other diseases of the circulatory system: Secondary | ICD-10-CM | POA: Insufficient documentation

## 2021-01-24 DIAGNOSIS — N1831 Chronic kidney disease, stage 3a: Secondary | ICD-10-CM | POA: Insufficient documentation

## 2021-01-24 LAB — BASIC METABOLIC PANEL
Anion gap: 10 (ref 5–15)
BUN: 12 mg/dL (ref 6–20)
CO2: 23 mmol/L (ref 22–32)
Calcium: 8.6 mg/dL — ABNORMAL LOW (ref 8.9–10.3)
Chloride: 106 mmol/L (ref 98–111)
Creatinine, Ser: 1.17 mg/dL (ref 0.61–1.24)
GFR, Estimated: >60 mL/min (ref >60)
Glucose, Bld: 107 mg/dL — ABNORMAL HIGH (ref 70–99)
Potassium: 3.8 mmol/L (ref 3.5–5.1)
Sodium: 139 mmol/L (ref 135–145)

## 2021-01-24 LAB — BRAIN NATRIURETIC PEPTIDE: B Natriuretic Peptide: 95.1 pg/mL (ref 0.0–100.0)

## 2021-01-24 MED ORDER — ASPIRIN EC 81 MG PO TBEC: 81.0000 mg | DELAYED_RELEASE_TABLET | Freq: Every day | ORAL | 11 refills | Status: DC

## 2021-01-24 MED ORDER — SPIRONOLACTONE 25 MG PO TABS: 25.0000 mg | ORAL_TABLET | Freq: Every day | ORAL | 3 refills | Status: DC

## 2021-01-24 MED ORDER — CARVEDILOL 25 MG PO TABS: 25.0000 mg | ORAL_TABLET | Freq: Two times a day (BID) | ORAL | 3 refills | Status: DC

## 2021-01-24 MED ORDER — DAPAGLIFLOZIN PROPANEDIOL 10 MG PO TABS: 10.0000 mg | ORAL_TABLET | Freq: Every day | ORAL | 3 refills | Status: DC

## 2021-01-24 MED FILL — SPIRONOLACTONE 25 MG TABS: 25 | 34 days supply | Qty: 34 | Fill #0

## 2021-01-24 MED FILL — FUROSEMIDE 40 MG TAB: 40 | 30 days supply | Qty: 30 | Fill #0

## 2021-01-24 MED FILL — CARVEDILOL 25 MG TABLET: 25 | 34 days supply | Qty: 68 | Fill #0

## 2021-01-24 MED FILL — ATORVASTATIN 80 MG TABLET: 80 | 34 days supply | Qty: 34 | Fill #0

## 2021-01-24 NOTE — Progress Notes (Signed)
Medication Samples have been provided to the patient.  Drug name: [name]       Strength: 10 mg        Qty: 2  LOT: [id number]  Exp.Date: 07/17/23  Dosing instructions: Take 1 tab Daily  The patient has been instructed regarding the correct time, dose, and frequency of taking this medication, including desired effects and most common side effects.   Nira Conn Cai Flott 11:24 AM 01/24/2021   Medication Samples have been provided to the patient.  Drug name: Delene Loll       Strength: 49/51 mg        Qty: 2  LOT: [id number]  Exp.Date: 12/23  Dosing instructions: take 1 tab Twice daily   The patient has been instructed regarding the correct time, dose, and frequency of taking this medication, including desired effects and most common side effects.   Jennessa Trigo 11:24 AM 01/24/2021

## 2021-01-24 NOTE — Telephone Encounter (Signed)
-----   Message from Scarlette Calico, RN sent at 01/24/2021  5:32 PM EST ----- Marykay Lex, this patient needs a refill of his Prozac and Zetia from you guys please, thanks

## 2021-01-24 NOTE — Telephone Encounter (Signed)
Contacted [employer] to follow up on Entresto patient assistance application submitted. He was approved on 01/16/21 through 01/16/22. However, his phone has been off for some time now. When he comes to clinic today, we will have him call [employer] using the clinic phone to request his first fill to be delivered to his home.   Contacted [employer]/gallery curator for BiDil and he has been approved since 01/03/21. They sent out his 1st 90 day supply of the medication to his home.  Contacted [place] patient assistance program to follow up on Farxiga application. He has been approved as of 01/03/21 through 01/02/22. The pharmacy will be sending out his 90 day supply to his home.

## 2021-01-24 NOTE — Progress Notes (Signed)
Refills for Furosemide, Spironolactone, Carvedilol, and Atorvastatin all sent to [facility] under HF FUND

## 2021-01-24 NOTE — Telephone Encounter (Signed)
Requested medications are due for refill today.  yes  Requested medications are on the active medications list.  yes  Last refill. 01/05/2021 & 01/06/2021  Future visit scheduled.   no  Notes to clinic. Please advise. Pt has new lab work - no notes regarding.

## 2021-01-24 NOTE — Patient Instructions (Signed)
Please take medications per Med List below  Thank you for allowing [place] to provider your heart failure care after your recent hospitalization. Please follow-up with our [facility] in 2 weeks  If you have any questions or concerns before your next appointment please send [place] a message through [place] or call our office at [phone].    TO LEAVE A MESSAGE FOR THE NURSE SELECT OPTION 2, PLEASE LEAVE A MESSAGE INCLUDING: . YOUR NAME . DATE OF BIRTH . CALL BACK NUMBER . REASON FOR CALL**this is important as we prioritize the call backs  YOU WILL RECEIVE A CALL BACK THE SAME DAY AS LONG AS YOU CALL BEFORE 4:00 PM

## 2021-01-24 NOTE — Progress Notes (Signed)
[facility]  Heart Failure Pharmacist Encounter  HPI:  55 yo M with PMH of HTN, HLD, DM, CVA, and CHF. He presented to the ED on 12/24/20 for shortness of breath and noted he ran out of his medications. An ECHO was done on 12/24/20 and LVEF was <20% (was 40-45% in March 2021). He was discharged to [facility] behavioral health on 01/03/21 and was discharged from that facility on 01/06/21.  Today, Matthew [name]. presents to the Heart Failure [facility] Clinic for follow up. He has been "stretching out" his medications because he has been running low on all of his medications since discharge. He took his last lasix tablet yesterday. He complains of shortness of breath and DOE, denies orthopnea/PND or edema. He complains of mild lightheadedness/dizziness when he stands up too quickly. He has been taking his weights daily at home (reports 192-195 lbs), and has been checking his BP "every now and then." He is not following a low sodium diet at home and drinks ~60 oz of fluid daily.  HF Medications: Furosemide 40 mg daily Carvedilol 25 mg BID Entresto 49/51 mg BID BiDil 20/37.5 mg 2 tabs TID Spironolactone 25 mg daily Farxiga 10 mg daily  Has the patient been experiencing any side effects to the medications prescribed?  Yes - headaches  Does the patient have any problems obtaining medications due to transportation or finances?   yes  Understanding of regimen: good Understanding of indications: good Potential of compliance: poor - limited access to medications Patient understands to avoid NSAIDs. Patient understands to avoid decongestants.   Pertinent Lab Values: . Serum creatinine 1.44, BUN 23, Potassium 4.1, Sodium 137, BNP 647.2, Magnesium 1.8   Vital Signs: . Weight: 197 lbs (discharge weight: 193 lbs on 01/03/21) . Blood pressure: 132/78 mmHg  . Heart rate: 63 bpm   Medication Assistance / Insurance Benefits Check: Does the patient have prescription insurance?  No  Does the patient  qualify for medication assistance through manufacturers or grants?   Yes . Eligible grants and/or patient assistance programs: BiDil, Lisabeth Register  . Medication assistance applications approved: BiDil, Lisabeth Register Approved medication assistance renewals will be completed by: [place]:  Current outpatient pharmacy: [facility] Was the [facility] pharmacy used to supply discharge medications? no   Assessment: 1) Chronic systolic CHF (EF <94%), due to NICM. NYHA class II symptoms. - Continue furosemide 40 mg daily - Continue carvedilol 25 mg BID - Conitnue Entresto 49/51 mg BID - Continue BiDil 20/37.5 mg 2 tabs TID - Continue spironolactone 25 mg daily - Continue Farxiga 10 mg daily - Patient provided with samples of Entresto and Farxiga while medications are being delivered from patient assistance programs   Plan: 1) Medication changes: - No medication changes today since he has not been taking his medications as prescribed (lack of access to medications)  2) Patient Assistance: - Delene Loll, BiDil, and [place] applications have been approved. He has received the BiDil in the mail already. [employer] with the patient to request delivery of his Entresto - overnight requested for him to receive this tomorrow. - [name] should be delivered in 7-12 business days. - CSW assisting with generic medication access - will obtain them from [place] using HF fund  3) Follow up: - Next appointment with HF NP/PA in 2 weeks  Kerby Nora, PharmD, [facility] Pharmacist [phone]

## 2021-01-24 NOTE — Progress Notes (Signed)
LATE ENTRY from 2:00 pm 01/24/21: p/u pt's medications from [facility] and filled his pill box for him. He is thankful for the assistance.

## 2021-01-24 NOTE — Progress Notes (Signed)
Heart and Vascular Care Navigation  01/24/2021  Matthew [name] [birth year] [id number]  Reason for Referral: CSW met with patient in the [facility] clinic for resources for [facility] needs.                                                                                                     Assessment:   Patient lives with his two children who he and his wife became legal guardians when they were younger and now ages 54yo male and 54yo male in a townhome. Patient states his wife passed away a year ago from complications from DM. He has been hospitalized 4x in the last year for CHF. He was recently hospitalized at [facility] due to depression and grief issues. He worked at Production designer, theatre/television/film on the [employer] and last worked before Thanksgiving.  He has had no income since and received Social security benefits for his children but missed the recertification due to hospitalization.  He states he is behind in his mortgage and had his phone turned off for non payment.  He is uninsured and unsure if medicaid was started in the hospital.                          HRT/VAS Care Coordination    Patients Home Cardiology Office Heart Failure Clinic   Outpatient Care Team Social Worker   Social Worker Name: Matthew [name], [place] [phone]   Living arrangements for the past 2 months Apartment   Lives with: Minor Children; Self  54yo male and 54yo male (Patient is legal guardian)   Patient Current Insurance Coverage Medicaid Pending   Patient Has Concern With [place] Yes   Patient Concerns With Medical Bills referred to [facility]   Does Patient Have Prescription Coverage? No   Patient Prescription Assistance Programs Heart Failure Fund   Home Assistive Devices/Equipment Walker (specify type)      Social History:                                                                             SDOH Screenings   Alcohol Screen: Low Risk   . Last Alcohol Screening Score (AUDIT): 0   Depression (PHQ2-9): Medium Risk  . PHQ-2 Score: 15  Financial Resource Strain: High Risk  . Difficulty of Paying Living Expenses: Very hard  Food Insecurity: Food Insecurity Present  . Worried About Charity fundraiser in the Last Year: Sometimes true  . Ran Out of Food in the Last Year: Sometimes true  Housing: High Risk  . Last Housing Risk Score: 2  Physical Activity: Not on file  Social Connections: Not on file  Stress: Stress Concern Present  . Feeling of Stress : Very much  Tobacco Use: Medium  Risk  . Smoking Tobacco Use: Former Smoker  . Smokeless Tobacco Use: Former Soil scientist Needs: Higher education careers adviser  . Lack of Transportation (Medical): Yes  . Lack of Transportation (Non-Medical): Yes    SDOH Interventions: Financial Resources:  Sales promotion account executive Interventions: Scientist, product/process development (Comment) [place]:  Food Insecurity Interventions: Assist with [employer]  Housing Insecurity:  Housing Interventions: Other (Comment)  Transportation:   Transportation Interventions: Financial planner    Other Care Navigation Interventions:     Inpatient/Outpatient Substance Abuse Counseling/Rehab Options n/a  Provided Pharmacy assistance resources Heart Failure Fund  Patient expressed Mental Health concerns Yes, Referred to:  CSW for ongoing supportive counseling and will discuss referral to [facility] Support Group  Patient Referred to:    Follow-up plan:  CSW provided patient with a tracfone and a month of service to allow for contact with medical providers and staff to assist with medicaid and disability. CSW spoke with Honor Loh at [facility] who confirmed that patient was referred for [facility] and disability though the [facility]. CSW discussed ongoing supportive counseling and referral to grief support group and patient was wiling to pursue support. Patient shared financial concerns and need for assistance for mortgage  to keep a roof over his head. CSW will assist with referral to Patient Care Fund for financial assistance with mortgage. Patient states he is also struggling with [employer] bill and will return tomorrow to meet with CSW to discuss assistance with [employer] bill. CSW will follow up tomorrow with patient. Matthew [name], [place], [place]

## 2021-01-24 NOTE — Progress Notes (Signed)
Pt is out of Carvedilol, ASA, Spironolactone, and Bidil. He is almost out of other meds and has only been taking them occasionally to make them last as he was only given 7 days worth of meds when he was D/C'd from the hospital.

## 2021-01-25 ENCOUNTER — Telehealth (HOSPITAL_COMMUNITY): Payer: Self-pay | Admitting: Licensed Clinical Social Worker

## 2021-01-25 ENCOUNTER — Encounter (HOSPITAL_COMMUNITY): Payer: Medicaid Other

## 2021-01-25 NOTE — Telephone Encounter (Signed)
CSW met with patient today in the clinic and assisted with financial assistance for utility bills. Patient also followed up with social security for children's benefits for re certification. Patient assisted with transportation and financial assistance through the patient care fund to bridge the gap until he is able to restore benefits through social security. Patient will follow up and lee CSW informed of progress with benefits for social security benefits for the children. CSW provided supportive intervention around issues of grief and financial support to support patient during this time. Patient grateful for the support. CSW will follow up on next clinic visit. Raquel Sarna, [place], [place]

## 2021-01-27 ENCOUNTER — Other Ambulatory Visit: Payer: Self-pay | Admitting: Family Medicine

## 2021-01-27 MED ORDER — FLUOXETINE HCL 40 MG PO CAPS: 40.0000 mg | ORAL_CAPSULE | Freq: Every day | ORAL | 0 refills | Status: DC

## 2021-01-27 MED ORDER — EZETIMIBE 10 MG PO TABS: 10.0000 mg | ORAL_TABLET | Freq: Every day | ORAL | 0 refills | Status: DC

## 2021-01-30 MED FILL — EZETIMIBE 10 MG TABS: 10 | 7 days supply | Qty: 7 | Fill #0

## 2021-01-30 MED FILL — FLUoxetine HCL 40 MG CAPS: 40 | 30 days supply | Qty: 30 | Fill #0

## 2021-01-31 ENCOUNTER — Telehealth (HOSPITAL_COMMUNITY): Payer: Self-pay | Admitting: Licensed Clinical Social Worker

## 2021-01-31 NOTE — Telephone Encounter (Signed)
CSW spoke with patient to inform financial assistance from the Patient care Fund will be sent for payment. Patient grateful for the support and confirmed he will be at appointment next week. Patient confirms he has transportion Psychologist, counselling. Raquel Sarna, [place], [place]

## 2021-02-06 ENCOUNTER — Other Ambulatory Visit: Payer: Self-pay | Admitting: Internal Medicine

## 2021-02-06 ENCOUNTER — Ambulatory Visit: Payer: Medicaid Other | Admitting: Family Medicine

## 2021-02-06 ENCOUNTER — Ambulatory Visit: Payer: Medicaid Other | Attending: Internal Medicine | Admitting: Internal Medicine

## 2021-02-06 ENCOUNTER — Other Ambulatory Visit: Payer: Self-pay

## 2021-02-06 DIAGNOSIS — I5042 Chronic combined systolic (congestive) and diastolic (congestive) heart failure: Secondary | ICD-10-CM

## 2021-02-06 DIAGNOSIS — E782 Mixed hyperlipidemia: Secondary | ICD-10-CM

## 2021-02-06 DIAGNOSIS — F1490 Cocaine use, unspecified, uncomplicated: Secondary | ICD-10-CM

## 2021-02-06 DIAGNOSIS — F322 Major depressive disorder, single episode, severe without psychotic features: Secondary | ICD-10-CM

## 2021-02-06 MED ORDER — ISOSORB DINITRATE-HYDRALAZINE 20-37.5 MG PO TABS
2.0000 | ORAL_TABLET | Freq: Three times a day (TID) | ORAL | 1 refills | Status: DC
Start: 2021-02-06 — End: 2021-03-23

## 2021-02-06 MED ORDER — ATORVASTATIN CALCIUM 80 MG PO TABS: 80.0000 mg | ORAL_TABLET | Freq: Every day | ORAL | 1 refills | Status: DC

## 2021-02-06 MED ORDER — FUROSEMIDE 40 MG PO TABS
40.0000 mg | ORAL_TABLET | Freq: Every day | ORAL | 1 refills | Status: DC
Start: 2021-02-06 — End: 2021-02-08

## 2021-02-06 MED ORDER — EZETIMIBE 10 MG PO TABS: 10.0000 mg | ORAL_TABLET | Freq: Every day | ORAL | 0 refills | Status: DC

## 2021-02-06 MED ORDER — FLUOXETINE HCL 40 MG PO CAPS: 40.0000 mg | ORAL_CAPSULE | Freq: Every day | ORAL | 1 refills | Status: DC

## 2021-02-06 MED ORDER — SACUBITRIL-VALSARTAN 49-51 MG PO TABS
1.0000 | ORAL_TABLET | Freq: Two times a day (BID) | ORAL | 1 refills | Status: DC
Start: 2021-02-06 — End: 2022-02-26

## 2021-02-06 NOTE — Progress Notes (Signed)
Virtual Visit via Telephone Note  I connected with Matthew [name]. on 02/06/21 at 12:26 p.m by telephone and verified that I am speaking with the correct person using two identifiers.  Location: Patient: home Provider: office Pt, my CMA Ms. Lebron Quam and myself participated in this encounter. I discussed the limitations, risks, security and privacy concerns of performing an evaluation and management service by telephone and the availability of in person appointments. I also discussed with the patient that there may be a patient responsible charge related to this service. The patient expressed understanding and agreed to proceed.  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  PCP is Dr. Margarita Rana.  Purpose of today's visit is medication refill request.  He is requesting refill on several of his medications. He was recently hospitalized for 10 days with decompensated CHF and suicidal ideation.  His depression and suicidal ideation was a result of his wife dying suddenly in March of last year.  He has had a hard time dealing with it and was also having financial difficulties.  Urine drug screen was positive for cocaine.  After his CHF symptoms had stabilized, he was transferred to inpatient psychiatric ward where he was treated and remained until 01/06/2021.  He was placed on Prozac.  Since discharge, he has followed up with his cardiologist on the eighth of this month.  He denies any chest pains or lower extremity edema.  He noticed some mild shortness of breath at times.  Depression: Reports he still feels depressed because it is coming up on the anniversary of his wife's death next month.  He reports taking the Prozac.  He is not sure whether he was plugged into mental health  services upon hospital discharge.. Outpatient Encounter Medications as of 02/06/2021  Medication Sig  . aspirin EC 81 MG tablet Take 1 tablet (81 mg total) by mouth daily. Swallow whole.  Marland Kitchen atorvastatin (LIPITOR) 80 MG tablet Take 1 tablet (80 mg total) by mouth daily.  . carvedilol (COREG) 25 MG tablet Take 1 tablet (25 mg total) by mouth 2 (two) times daily.  . dapagliflozin propanediol (FARXIGA) 10 MG TABS tablet Take 1 tablet (10 mg total) by mouth daily.  Marland Kitchen ezetimibe (ZETIA) 10 MG tablet Take 1 tablet (10 mg total) by mouth daily.  Marland Kitchen FLUoxetine (PROZAC) 40 MG capsule Take 1 capsule (40 mg total) by mouth daily.  . furosemide (LASIX) 40 MG tablet Take 1 tablet (40 mg total) by mouth daily.  Marland Kitchen glucose blood (ONE TOUCH ULTRA TEST) test strip Use as instructed  . ibuprofen (ADVIL) 400 MG tablet Take 1 tablet (400 mg total) by mouth every 6 (six) hours as needed for headache or moderate pain.  . isosorbide-hydrALAZINE (BIDIL) 20-37.5 MG tablet Take 2 tablets by mouth every 8 (eight) hours.  . sacubitril-valsartan (ENTRESTO) 49-51 MG Take 1 tablet by mouth 2 (two) times daily.  Marland Kitchen spironolactone (ALDACTONE) 25 MG tablet Take 1 tablet (25 mg total) by mouth daily.  . timolol (TIMOPTIC) 0.5 % ophthalmic solution Place 1 drop into both eyes daily after breakfast.  . [DISCONTINUED] lovastatin (MEVACOR) 20 MG tablet Take 2 tablets (40 mg total) by mouth at bedtime.   No facility-administered encounter medications on file as of 02/06/2021.      Observations/Objective: Depression screen [facility] 2/9 01/24/2021  02/19/2019 11/18/2018  Decreased Interest 2 1 3   Down, Depressed, Hopeless 2 1 3   PHQ - 2 Score 4 2 6   Altered sleeping 3 0 1  Tired, decreased energy 3 1 3   Change in appetite 0 0 1  Feeling bad or failure about yourself  2 1 3   Trouble concentrating 2 0 2  Moving slowly or fidgety/restless 1 0 1  Suicidal thoughts 0 1 2  PHQ-9 Score 15 5 19   Difficult doing work/chores Extremely dIfficult - -   Some recent data might be hidden     Assessment and Plan: 1. Chronic combined systolic and diastolic congestive heart failure ([place]) I have sent refills on his medications to our pharmacy.  Encourage compliance with medications and follow-up with PCP. - FLUoxetine (PROZAC) 40 MG capsule; Take 1 capsule (40 mg total) by mouth daily.  Dispense: 30 capsule; Refill: 1 - sacubitril-valsartan (ENTRESTO) 49-51 MG; Take 1 tablet by mouth 2 (two) times daily.  Dispense: 60 tablet; Refill: 1 - isosorbide-hydrALAZINE (BIDIL) 20-37.5 MG tablet; Take 2 tablets by mouth every 8 (eight) hours.  Dispense: 90 tablet; Refill: 1 - furosemide (LASIX) 40 MG tablet; Take 1 tablet (40 mg total) by mouth daily.  Dispense: 30 tablet; Refill: 1  2. Current severe episode of major depressive disorder without psychotic features, unspecified whether recurrent ([place]) Continue Prozac.  I have submitted referral for him to see a psychiatrist.  We will also ask our LCSW to follow-up with him. - Ambulatory referral to Psychiatry  3. Mixed hyperlipidemia - atorvastatin (LIPITOR) 80 MG tablet; Take 1 tablet (80 mg total) by mouth daily.  Dispense: 30 tablet; Refill: 1 - ezetimibe (ZETIA) 10 MG tablet; Take 1 tablet (10 mg total) by mouth daily.  Dispense: 7 tablet; Refill: 0  4. Cocaine use Discouraged use.   Follow Up Instructions: 6 weeks with his PCP Dr. Margarita Rana.   I discussed the assessment and treatment plan with the patient. The patient was provided an opportunity to ask questions and all were answered. The patient agreed with the plan and demonstrated an understanding of the instructions.   The patient was advised to call back or seek an in-person evaluation if the symptoms worsen or if the condition fails to improve as anticipated.  I provided 13 minutes of non-face-to-face time during this encounter.   Karle Plumber, MD

## 2021-02-07 ENCOUNTER — Telehealth: Payer: Self-pay | Admitting: Licensed Clinical Social Worker

## 2021-02-07 ENCOUNTER — Telehealth: Payer: Self-pay

## 2021-02-07 NOTE — Telephone Encounter (Signed)
Call placed to patient. LCSW introduced self and explained role at [facility]. Pt was informed of a referral from Dr. Wynetta Emery to address behavioral health and/or resource needs.   Pt agreed to schedule an appointment for 02/15/21.

## 2021-02-07 NOTE — Telephone Encounter (Signed)
Patient understands that his Delene Loll is to be filled thru [employer] PAP.  Medication is shipped to his home.  Per last notation from Heart Failure he needed to set-up shipment, offered [employer]' # to pt, pt stated he had it.

## 2021-02-07 NOTE — Progress Notes (Signed)
Advanced Heart Failure Clinic Note   Referring Physician: Dr. Shan Levans PCP: Charlott Rakes, MD HF Cardiologist: Dr. Aundra Dubin  HPI: Matthew Petersoni s a 55 yo male with PMH significant for combined systolic and diastolic CHF, biventricular failure, T2DM, HTN, OSA, tobacco use, HLD, cocaine use, poor medication adherence, depression with recent suicidal ideation and admission to inpatient psychiatry, CVA, CKD 3a.     He was first admitted with heart failure symptoms in 2014-04-05 in the setting of long standing uncontrolled hypertension and smoking.  Had a LHC with normal cors.   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  Was not followed by cardiology or hospitalized for CHF during this time period.    2020-04-05 he began having CHF exacerbations after running out of meds between admissions.  Left AMA in 06-Apr-2023. Another similar admission in July, he was out of his medications at that time as well.    Recently, he was admitted 12/2020 with CHF exacerbation after running out of his medications.  He was diuresed, put on an excellent medication regimen. He also endorsed suicidal ideation at that time and was subsequently admitted to behavioral health.    Today he presents for AHF consult. Seen in [facility] clinic last week and was out of several medications and rationing others. He was SOB with minimal exertion. Today, overall feeling fine. Having some dizziness. Denies increasing SOB, CP, edema, or PND/Orthopnea. Appetite ok. No fever or chills. Weight at home 192-195 pounds. Taking all medications. Wears CPAP at night, reports compliance. Quit smoking 3 months ago, wife passed away 05-Apr-2020.  Review of Systems: [y] = yes, [ ]  = no   General: Weight gain [ ] ; Weight loss [ ] ; Anorexia [ ] ; Fatigue [x ]; Fever [ ] ; Chills [ ] ; Weakness [x ]  Cardiac: Chest pain/pressure [ ] ; Resting SOB [ ] ; Exertional SOB [ ] ; Orthopnea [ ] ; Pedal  Edema [ ] ; Palpitations [ ] ; Syncope [ ] ; Presyncope [ ] ; Paroxysmal nocturnal dyspnea[ ]   Pulmonary: Cough [ ] ; Wheezing[ ] ; Hemoptysis[ ] ; Sputum [ ] ; Snoring [ ]   GI: Vomiting[ ] ; Dysphagia[ ] ; Melena[ ] ; Hematochezia [ ] ; Heartburn[ ] ; Abdominal pain [ ] ; Constipation [ ] ; Diarrhea [ ] ; BRBPR [ ]   GU: Hematuria[ ] ; Dysuria [ ] ; Nocturia[ ]   Vascular: Pain in legs with walking [ ] ; Pain in feet with lying flat [ ] ; Non-healing sores [ ] ; Stroke [ ] ; TIA [ ] ; Slurred speech [ ] ;  Neuro: Headaches[x ]; Vertigo[ ] ; Seizures[ ] ; Paresthesias[ ] ;Blurred vision [ ] ; Diplopia [ ] ; Vision changes [ ]   Ortho/Skin: Arthritis [ ] ; Joint pain [ ] ; Muscle pain [ ] ; Joint swelling [ ] ; Back Pain [ ] ; Rash [ ]   Psych: Depression[x] ; Anxiety[ ]   Heme: Bleeding problems [ ] ; Clotting disorders [ ] ; Anemia [ ]   Endocrine: Diabetes [ ] ; Thyroid dysfunction[ ]   Past Medical History:  Diagnosis Date  . Acute combined systolic and diastolic CHF, NYHA class 3 ([place])    a. 05-Apr-2014 Echo: EF 25-30%.  . Cardiomyopathy ([place])    a. Apr 05, 2014 Echo: EF 25-30%, sev glob HK with inferolat HK->AK, mod conc LVH, Gr 2 DD, Mild MR, sev dil LA.  . Depression   . DM (diabetes mellitus) ([place])   . High cholesterol   . Hypertension   . Morbid obesity ([place])   . Tobacco abuse     Current Outpatient Medications  Medication Sig Dispense Refill  . aspirin EC 81 MG tablet Take 1 tablet (81 mg total) by mouth daily. Swallow whole. 30 tablet 11  . atorvastatin (LIPITOR) 80 MG tablet Take 1 tablet (80 mg total) by mouth daily. 30 tablet 1  . carvedilol (COREG) 25 MG tablet Take 1 tablet (25 mg total) by mouth 2 (two) times daily. 180 tablet 3  . dapagliflozin propanediol (FARXIGA) 10 MG TABS tablet Take 1 tablet (10 mg total) by mouth daily. 90 tablet 3  . ezetimibe (ZETIA) 10 MG tablet Take 1 tablet (10 mg total) by mouth daily. 7 tablet 0  . FLUoxetine (PROZAC) 40 MG capsule Take 1 capsule (40 mg total) by mouth daily. 30 capsule 1   . glucose blood (ONE TOUCH ULTRA TEST) test strip Use as instructed 30 each 12  . ibuprofen (ADVIL) 400 MG tablet Take 1 tablet (400 mg total) by mouth every 6 (six) hours as needed for headache or moderate pain. 30 tablet 0  . isosorbide-hydrALAZINE (BIDIL) 20-37.5 MG tablet Take 2 tablets by mouth every 8 (eight) hours. 90 tablet 1  . sacubitril-valsartan (ENTRESTO) 49-51 MG Take 1 tablet by mouth 2 (two) times daily. 60 tablet 1  . spironolactone (ALDACTONE) 25 MG tablet Take 1 tablet (25 mg total) by mouth daily. 90 tablet 3  . timolol (TIMOPTIC) 0.5 % ophthalmic solution Place 1 drop into both eyes daily after breakfast.    . furosemide (LASIX) 40 MG tablet Take 0.5 tablets (20 mg total) by mouth daily. 30 tablet 1   No current facility-administered medications for this encounter.    No Known Allergies    Social History   Socioeconomic History  . Marital status: Widowed    Spouse name: Not on file  . Number of children: 2  . Years of education: 67  . Highest education level: Not on file  Occupational History  . Occupation: [employer]  Tobacco Use  . Smoking status: Former Smoker    Years: 20.00    Types: Cigarettes  . Smokeless tobacco: Former Systems developer    Quit date: 11/14/2020  Vaping Use  . Vaping Use: Never used  Substance and Sexual Activity  . Alcohol use: No  . Drug use: No  . Sexual activity: Yes  Other Topics Concern  . Not on file  Social History Narrative   Lives with 2 young children in [place].  Wife died in 05-22-2020.  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   Left-handed   Caffeine: occasional tea   Social Determinants of Health   Financial Resource Strain: High Risk  . Difficulty of Paying Living Expenses: Very hard  Food Insecurity: Food Insecurity Present  . Worried About Charity fundraiser in the Last Year: Sometimes true  . Ran Out of Food in the Last Year: Sometimes true  Transportation Needs: Unmet Transportation Needs   . Lack of Transportation (Medical): Yes  . Lack of Transportation (Non-Medical): Yes  Physical Activity: Not on file  Stress: Stress Concern Present  . Feeling of Stress : Very much  Social Connections: Not on file  Intimate Partner Violence: Not on file    Family History  Problem Relation Age of Onset  . Heart attack Father        died @ 37  . Cervical cancer Mother        died @ 57    Vitals:   24-Feb-2021 1156  BP: 110/78  Pulse:  68  SpO2: 99%  Weight: 87.7 kg (193 lb 6.4 oz)   Wt Readings from Last 3 Encounters:  02/08/21 87.7 kg (193 lb 6.4 oz)  01/24/21 89.4 kg (197 lb)  01/03/21 88 kg (194 lb)   PHYSICAL EXAM: General:  NAD. No resp difficulty, in wheelchair. HEENT: Normal Neck: Supple. No JVD. Carotids 2+ bilat; no bruits. No lymphadenopathy or thryomegaly appreciated. Cor: PMI nondisplaced. Regular rate & rhythm. No rubs, gallops or murmurs. Lungs: Clear Abdomen: Soft, nontender, nondistended. No hepatosplenomegaly. No bruits or masses. Good bowel sounds. Extremities: No cyanosis, clubbing, rash, edema Neuro: alert & oriented x 3, cranial nerves grossly intact. Moves all 4 extremities w/o difficulty. Affect pleasant.  ASSESSMENT & PLAN: 1. Combined systolic and diastolic CHF Biventricular failure, Nonischemic Cardiomyopathy: - 2/2 poorly controlled HTN, normal cors on LHC 2015, +cocaine 1/22 admission. - NYHA III symptoms, volume is OK today, may be slightly dry. - With recent dizziness, decrease daily lasix to 20 mg daily. - Continue Entresto 49/51 mg bid. - Continue carvedilol 25 mg bid. - Continue Farxiga 10 mg daily. - Continue spiro 25 mg daily. - Continue Bidil 2 tabs tid. - BMET today.  2. OSA: - Compliant with CPAP, continue.  3. Tobacco Use Disorder: - Stopped smoking in November 2021.  4. Cocaine Use Disorder: - UDS + on 12/2020. - Last used Sunday. Using 1-2x/month. - Encouraged abstinence. - HFSW helping with substance abuse resources  in community.  5. CKD 3a: - BMET today.  6. HLD: - Continue atorvastatin + Zetia.  7. MDD: - Mood stable today. Denies SI. - Continue Prozac.  - Has been referred to Psychiatry per PCP.  8. T2DM: - A1C (1/22) 5.5 - Continue Farxiga, no GU symptoms.  9. 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  10. Noncompliance. - Refer to Paramedicine. - HFSW helping with community resources for substance abuse.  Follow up in 3 weeks with PharmD and in 6 weeks with APP  Rafael Bihari, FNP-BC 02/08/21

## 2021-02-08 ENCOUNTER — Other Ambulatory Visit: Payer: Self-pay | Admitting: Family Medicine

## 2021-02-08 ENCOUNTER — Ambulatory Visit (HOSPITAL_COMMUNITY)
Admission: RE | Admit: 2021-02-08 | Discharge: 2021-02-08 | Disposition: A | Discharge status: Home / Self Care | Payer: Self-pay | Source: Ambulatory Visit | Attending: Family Medicine | Admitting: Family Medicine

## 2021-02-08 ENCOUNTER — Other Ambulatory Visit: Payer: Self-pay

## 2021-02-08 ENCOUNTER — Encounter (HOSPITAL_COMMUNITY): Payer: Self-pay

## 2021-02-08 VITALS — BP 110/78 | HR 68 | Wt 193.4 lb

## 2021-02-08 DIAGNOSIS — I130 Hypertensive heart and chronic kidney disease with heart failure and stage 1 through stage 4 chronic kidney disease, or unspecified chronic kidney disease: Secondary | ICD-10-CM | POA: Insufficient documentation

## 2021-02-08 DIAGNOSIS — Z91148 Patient's other noncompliance with medication regimen for other reason: Secondary | ICD-10-CM

## 2021-02-08 DIAGNOSIS — Z7982 Long term (current) use of aspirin: Secondary | ICD-10-CM | POA: Insufficient documentation

## 2021-02-08 DIAGNOSIS — Z8249 Family history of ischemic heart disease and other diseases of the circulatory system: Secondary | ICD-10-CM | POA: Insufficient documentation

## 2021-02-08 DIAGNOSIS — I5042 Chronic combined systolic (congestive) and diastolic (congestive) heart failure: Secondary | ICD-10-CM

## 2021-02-08 DIAGNOSIS — F17200 Nicotine dependence, unspecified, uncomplicated: Secondary | ICD-10-CM

## 2021-02-08 DIAGNOSIS — G4733 Obstructive sleep apnea (adult) (pediatric): Secondary | ICD-10-CM

## 2021-02-08 DIAGNOSIS — I509 Heart failure, unspecified: Secondary | ICD-10-CM

## 2021-02-08 DIAGNOSIS — Z9119 Patient's noncompliance with other medical treatment and regimen: Secondary | ICD-10-CM | POA: Insufficient documentation

## 2021-02-08 DIAGNOSIS — I5082 Biventricular heart failure: Secondary | ICD-10-CM | POA: Insufficient documentation

## 2021-02-08 DIAGNOSIS — F329 Major depressive disorder, single episode, unspecified: Secondary | ICD-10-CM | POA: Insufficient documentation

## 2021-02-08 DIAGNOSIS — Z8673 Personal history of transient ischemic attack (TIA), and cerebral infarction without residual deficits: Secondary | ICD-10-CM

## 2021-02-08 DIAGNOSIS — Z9114 Patient's other noncompliance with medication regimen: Secondary | ICD-10-CM

## 2021-02-08 DIAGNOSIS — E7800 Pure hypercholesterolemia, unspecified: Secondary | ICD-10-CM | POA: Insufficient documentation

## 2021-02-08 DIAGNOSIS — N1831 Chronic kidney disease, stage 3a: Secondary | ICD-10-CM

## 2021-02-08 DIAGNOSIS — E785 Hyperlipidemia, unspecified: Secondary | ICD-10-CM | POA: Insufficient documentation

## 2021-02-08 DIAGNOSIS — Z79899 Other long term (current) drug therapy: Secondary | ICD-10-CM | POA: Insufficient documentation

## 2021-02-08 DIAGNOSIS — F1410 Cocaine abuse, uncomplicated: Secondary | ICD-10-CM

## 2021-02-08 DIAGNOSIS — E782 Mixed hyperlipidemia: Secondary | ICD-10-CM

## 2021-02-08 DIAGNOSIS — F0631 Mood disorder due to known physiological condition with depressive features: Secondary | ICD-10-CM

## 2021-02-08 DIAGNOSIS — Z87891 Personal history of nicotine dependence: Secondary | ICD-10-CM | POA: Insufficient documentation

## 2021-02-08 DIAGNOSIS — Z7984 Long term (current) use of oral hypoglycemic drugs: Secondary | ICD-10-CM | POA: Insufficient documentation

## 2021-02-08 DIAGNOSIS — E1122 Type 2 diabetes mellitus with diabetic chronic kidney disease: Secondary | ICD-10-CM | POA: Insufficient documentation

## 2021-02-08 DIAGNOSIS — E1121 Type 2 diabetes mellitus with diabetic nephropathy: Secondary | ICD-10-CM

## 2021-02-08 LAB — BASIC METABOLIC PANEL
Anion gap: 11 (ref 5–15)
BUN: 19 mg/dL (ref 6–20)
CO2: 23 mmol/L (ref 22–32)
Calcium: 9.1 mg/dL (ref 8.9–10.3)
Chloride: 105 mmol/L (ref 98–111)
Creatinine, Ser: 1.65 mg/dL — ABNORMAL HIGH (ref 0.61–1.24)
GFR, Estimated: 49 mL/min — ABNORMAL LOW (ref >60)
Glucose, Bld: 111 mg/dL — ABNORMAL HIGH (ref 70–99)
Potassium: 3.7 mmol/L (ref 3.5–5.1)
Sodium: 139 mmol/L (ref 135–145)

## 2021-02-08 MED ORDER — FUROSEMIDE 40 MG PO TABS: 20.0000 mg | ORAL_TABLET | Freq: Every day | ORAL | 1 refills | Status: DC

## 2021-02-08 NOTE — Patient Instructions (Addendum)
DECREASE Lasix to 20 mg, (one half) tab daily  Labs today We will only contact you if something comes back abnormal or we need to make some changes. Otherwise no news is good news!  Your physician recommends that you schedule a follow-up appointment in: 3 weeks with the pharmacy team and in 6 weeks  in the Advanced Practitioners (PA/NP) Clinic    Do the following things EVERYDAY: 1) Weigh yourself in the morning before breakfast. Write it down and keep it in a log. 2) Take your medicines as prescribed 3) Eat low salt foods--Limit salt (sodium) to 2000 mg per day.  4) Stay as active as you can everyday 5) Limit all fluids for the day to less than 2 liters  At the [facility], you and your health needs are our priority. As part of our continuing mission to provide you with exceptional heart care, we have created designated Provider Care Teams. These Care Teams include your primary Cardiologist (physician) and Advanced Practice Providers (APPs- Physician Assistants and Nurse Practitioners) who all work together to provide you with the care you need, when you need it.   You may see any of the following providers on your designated Care Team at your next follow up: Marland Kitchen Dr Glori Bickers . Dr Loralie Champagne . Dr Vickki Muff . Darrick Grinder, NP . Lyda Jester, [place] . Audry Riles, PharmD   Please be sure to bring in all your medications bottles to every appointment.    If you have any questions or concerns before your next appointment please send [place] a message through [place] or call our office at [phone].    TO LEAVE A MESSAGE FOR THE NURSE SELECT OPTION 2, PLEASE LEAVE A MESSAGE INCLUDING: . YOUR NAME . DATE OF BIRTH . CALL BACK NUMBER . REASON FOR CALL**this is important as we prioritize the call backs  YOU WILL RECEIVE A CALL BACK THE SAME DAY AS LONG AS YOU CALL BEFORE 4:00 PM

## 2021-02-08 NOTE — Progress Notes (Signed)
Paramedicine Initial Assessment:  Housing:  In what kind of housing do you live? House/apt/trailer/shelter? apt  Do you rent/pay a mortgage/own? mortgage  Do you live with anyone? Daughter(15) and son (54)  Are you currently worried about losing your housing? no  Within the past 12 months have you ever stayed outside, in a car, tent, a shelter, or temporarily with someone? no  Within the past 12 months have you been unable to get utilities when it was really needed? Yes- had water shut off in January but was turned back on by DSS and he thinks they are planning to back pay the bill  Social:  What is your current marital status? widowed  Do you have any children? 2  Do you have family or friends who live locally? Mother in law and cousin  Food:  Within the past 12 months were you ever worried that food would run out before you got money to buy more? yes  Within the past 8months have you run out of food and didn't have money to buy more? yes  Just got food stamps- $250/month but doesn't come for 5-7 days.  Is currently out of food- CSW provided with box of food provided from a local church and pt will call CSW in the morning to get a ride to [employer] to get food.  Income:  What is your current source of income? Children's social security income will be about $1280/month but hasn't gotten a check yet  How hard is it for you to pay for the basics like food housing, medical care, and utilities? Very hard- currently behind on his mortgage- CSW referred to [facility] for mortgage assistance.  Do you have outstanding medical bills? Yes- awaiting help with medicaid/disability through [facility] and [employer]:  Are you currently insured? no  Do you have prescription coverage? no  If no insurance, have you applied for coverage (Medicaid, disability, marketplace etc)? With [employer]  Transportation:  Do you have transportation to your medical appointments? Using  [facility] transport now but transportation was a barrier.  In the past 12 months has lack of transportation kept you from medical appts or from getting medications? yes  In the past 12 months has lack of transportation kept you from meetings, work, or getting things you needed? yes   Daily Health Needs: Do you have a working scale at home? yes  How do you manage your medications at home? Takes them out of the bottle and out of a pill box- finds pill box confusing  Do you ever take your medications differently than prescribed? Not intentionally  Do you have issues affording your medications? Not at this time- on HF fund  If yes, has this ever prevented you from obtaining medications? Previously but not an issue now.  Do you have any concerns with mobility at home? no  Do you use any assistive devices at home or have PCS at home?  Do you have a PCP? Dr. Margarita Rana  Do you have any trouble reading or writing? no  Are there any additional barriers you see to getting the care you need? Not at this time.  CSW will continue to follow through paramedicine program and assist as needed.  Jorge Ny, LCSW Clinical Social Worker Advanced Heart Failure Clinic Desk#: [phone] Cell#: [phone]

## 2021-02-09 ENCOUNTER — Other Ambulatory Visit (HOSPITAL_COMMUNITY): Payer: Self-pay | Admitting: Cardiology

## 2021-02-09 ENCOUNTER — Telehealth (HOSPITAL_COMMUNITY): Payer: Self-pay | Admitting: Licensed Clinical Social Worker

## 2021-02-09 DIAGNOSIS — I5042 Chronic combined systolic (congestive) and diastolic (congestive) heart failure: Secondary | ICD-10-CM

## 2021-02-09 MED FILL — FLUoxetine HCL 40 MG CAPS: 40 | 30 days supply | Qty: 30 | Fill #0

## 2021-02-09 MED FILL — EZETIMIBE 10 MG TABS: 10 | 7 days supply | Qty: 7 | Fill #0

## 2021-02-09 NOTE — Progress Notes (Signed)
bmet  

## 2021-02-09 NOTE — Telephone Encounter (Signed)
Pt called CSW this morning to ask with assistance getting to food pantry after clinic appt this morning.  CSW arranged transport to [place] to pick up 2 medications that he needed and then to go to [employer] to access the food pantry.  CSW will continue to follow and assist as needed  Jorge Ny, [facility] Desk#: [phone] Cell#: [phone]

## 2021-02-09 NOTE — Telephone Encounter (Signed)
CSW called [employer] to inquire about water assistance for his outstanding bills.  Pt had thought he applied with [employer] back after his [place] stay and that they had water turned back on and made a commitment to pay the bill so he would not have another shut off.  [employer] worker unable to find record of this but submitting for someone to follow up who had access to higher level of detail.  Will continue to follow and assist as needed  Jorge Ny, [facility] Desk#: [phone] Cell#: [phone]

## 2021-02-10 ENCOUNTER — Telehealth (HOSPITAL_COMMUNITY): Payer: Self-pay | Admitting: Licensed Clinical Social Worker

## 2021-02-10 NOTE — Telephone Encounter (Signed)
Pt called CSW to check in and request assistance with several things.    Reports trip to food pantry yesterday went smoothly and that he and his family now have sufficient food while they await his food stamp card arriving.  Pt states he got a notice from [facility] about applying to receive his wife's benefits but doesn't have access to the Internet at home.  Pt to come to clinic Monday so CSW can assist with applying online- pt to bring letter from [facility] with the instructions.  Pt inquired about [employer] water assistance.  CSW informed pt that they should be calling me back in next 1-2 days but that they had not found his application with an initial search.  Pt would also like to go to [employer] Monday to apply for utility assistance there- CSW will help with transport to [employer] then get him a ride to the clinic so we can work on [employer].  Will continue to follow and assist as needed  Jorge Ny, [facility] Desk#: [phone] Cell#: [phone]

## 2021-02-13 ENCOUNTER — Telehealth (HOSPITAL_COMMUNITY): Payer: Self-pay | Admitting: Licensed Clinical Social Worker

## 2021-02-13 NOTE — Telephone Encounter (Signed)
Pt called CSW to request help with transport to several locations this morning.  CSW assisted pt get to [facility] to work on setting up account for his children to receive SSA benefits.  Then helped pt get to [employer] to inquire about utility assistance.  Pt then came to clinic to go over [place] paperwork for deceased wife- pt has phone appt in April- nothing indicated on paperwork that needs to be completed prior to that time.  Will continue to follow and assist as needed  Jorge Ny, [facility] Desk#: [phone] Cell#: [phone]

## 2021-02-15 ENCOUNTER — Ambulatory Visit: Payer: Medicaid Other | Admitting: Licensed Clinical Social Worker

## 2021-02-21 ENCOUNTER — Telehealth (HOSPITAL_COMMUNITY): Payer: Self-pay | Admitting: Licensed Clinical Social Worker

## 2021-02-22 ENCOUNTER — Telehealth (HOSPITAL_COMMUNITY): Payer: Self-pay

## 2021-02-22 NOTE — Telephone Encounter (Signed)
Spoke to [place] who reports today or tomorrow aren't good days for a home visit and asked if we could do Monday. I plan to see Mirza Monday at home at 3:30. Call complete.

## 2021-02-23 NOTE — Telephone Encounter (Signed)
CSW received call from pt stating that he was having trouble organizing his medications.  CSW updated Community paramedic who will complete home visit today or tomorrow to assist  Will continue to follow and assist as needed  Matthew [name], [place] Worker [facility] Desk#: [phone] Cell#: [phone]

## 2021-02-27 ENCOUNTER — Telehealth (HOSPITAL_COMMUNITY): Payer: Self-pay | Admitting: Licensed Clinical Social Worker

## 2021-02-27 ENCOUNTER — Other Ambulatory Visit (HOSPITAL_COMMUNITY): Payer: Self-pay

## 2021-02-27 NOTE — Telephone Encounter (Signed)
Pt phone wasn't working as minutes ran out on 3/10 and he was unable to afford to buy additional minutes.  CSW able to refill phone for 30 day unlimited calls- last day of service 4/13  Will continue to follow and assist as needed  Jorge Ny, [facility] Desk#: [phone] Cell#: [phone]

## 2021-02-27 NOTE — Progress Notes (Signed)
Paramedicine Encounter    Patient ID: Matthew Heman., male    DOB: 1966/08/06, 55 y.o.   MRN: [id number]   Patient Care Team: Charlott Rakes, MD as PCP - General (Family Medicine) Jettie Booze, MD as Consulting Physician (Cardiology) Jettie Booze, MD as Consulting Physician (Cardiology)  Patient Active Problem List   Diagnosis Date Noted  . MDD (major depressive disorder), recurrent severe, without psychosis ([place]) 01/03/2021  . Acute exacerbation of congestive heart failure ([place]) 12/24/2020  . Acute respiratory failure with hypoxia ([place]) 02/15/2020  . Acute on chronic combined systolic and diastolic CHF (congestive heart failure) ([place]) 02/15/2020  . Hypertensive urgency 02/15/2020  . CHF (congestive heart failure) ([place]) 02/15/2020  . 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 Expressive aphasia 02/20/2017  . Mixed hyperlipidemia   . History of stroke 01/31/2017  . Erectile dysfunction 07/30/2016  . OSA (obstructive sleep apnea) 05/10/2015  . CKD (chronic kidney disease) stage 2, GFR 60-89 ml/min 03/10/2014  . Diabetes mellitus type II, controlled ([place]) 03/10/2014  . Chronic combined systolic and diastolic congestive heart failure ([place]) 03/03/2014  . Smoker   . Morbid obesity ([place])   . Hypertension 03/02/2014    Current Outpatient Medications:  .  aspirin EC 81 MG tablet, Take 1 tablet (81 mg total) by mouth daily. Swallow whole., Disp: 30 tablet, Rfl: 11 .  atorvastatin (LIPITOR) 80 MG tablet, Take 1 tablet (80 mg total) by mouth daily., Disp: 30 tablet, Rfl: 1 .  carvedilol (COREG) 25 MG tablet, Take 1 tablet (25 mg total) by mouth 2 (two) times daily., Disp: 180 tablet, Rfl: 3 .  dapagliflozin propanediol (FARXIGA) 10 MG TABS tablet, Take 1 tablet (10 mg total) by mouth daily., Disp: 90 tablet, Rfl: 3 .  ezetimibe (ZETIA) 10 MG tablet, Take 1 tablet (10 mg total) by mouth daily., Disp: 7 tablet, Rfl: 0 .  FLUoxetine (PROZAC) 40 MG capsule, Take 1  capsule (40 mg total) by mouth daily., Disp: 30 capsule, Rfl: 1 .  furosemide (LASIX) 40 MG tablet, Take 0.5 tablets (20 mg total) by mouth daily., Disp: 30 tablet, Rfl: 1 .  glucose blood (ONE TOUCH ULTRA TEST) test strip, Use as instructed, Disp: 30 each, Rfl: 12 .  ibuprofen (ADVIL) 400 MG tablet, Take 1 tablet (400 mg total) by mouth every 6 (six) hours as needed for headache or moderate pain., Disp: 30 tablet, Rfl: 0 .  isosorbide-hydrALAZINE (BIDIL) 20-37.5 MG tablet, Take 2 tablets by mouth every 8 (eight) hours., Disp: 90 tablet, Rfl: 1 .  sacubitril-valsartan (ENTRESTO) 49-51 MG, Take 1 tablet by mouth 2 (two) times daily., Disp: 60 tablet, Rfl: 1 .  spironolactone (ALDACTONE) 25 MG tablet, Take 1 tablet (25 mg total) by mouth daily., Disp: 90 tablet, Rfl: 3 .  timolol (TIMOPTIC) 0.5 % ophthalmic solution, Place 1 drop into both eyes daily after breakfast., Disp: , Rfl:  No Known Allergies   Social History   Socioeconomic History  . Marital status: Widowed    Spouse name: Not on file  . Number of children: 2  . Years of education: 55  . Highest education level: Not on file  Occupational History  . Occupation: [employer]  Tobacco Use  . Smoking status: Former Smoker    Years: 20.00    Types: Cigarettes  . Smokeless tobacco: Former Systems developer    Quit date: 11/14/2020  Vaping Use  . Vaping Use: Never used  Substance and Sexual Activity  . Alcohol use:  No  . Drug use: No  . Sexual activity: Yes  Other Topics Concern  . Not on file  Social History Narrative   Lives with 2 young children in [place].  Wife died in 05/22/2020.  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   Left-handed   Caffeine: occasional tea   Social Determinants of Health   Financial Resource Strain: High Risk  . Difficulty of Paying Living Expenses: Very hard  Food Insecurity: Food Insecurity Present  . Worried About Charity fundraiser in the Last Year: Sometimes true  . Ran Out of Food  in the Last Year: Sometimes true  Transportation Needs: Unmet Transportation Needs  . Lack of Transportation (Medical): Yes  . Lack of Transportation (Non-Medical): Yes  Physical Activity: Not on file  Stress: Stress Concern Present  . Feeling of Stress : Very much  Social Connections: Not on file  Intimate Partner Violence: Not on file    Physical Exam Vitals reviewed.  Constitutional:      Appearance: Normal appearance. He is normal weight.  HENT:     Head: Normocephalic.     Nose: Nose normal.     Mouth/Throat:     Mouth: Mucous membranes are moist.     Pharynx: Oropharynx is clear.  Eyes:     Conjunctiva/sclera: Conjunctivae normal.     Pupils: Pupils are equal, round, and reactive to light.  Cardiovascular:     Rate and Rhythm: Normal rate and regular rhythm.     Pulses: Normal pulses.     Heart sounds: Normal heart sounds.  Pulmonary:     Effort: Pulmonary effort is normal.     Breath sounds: Normal breath sounds.  Abdominal:     General: Abdomen is flat.     Palpations: Abdomen is soft.  Musculoskeletal:        General: Normal range of motion.     Cervical back: Normal range of motion.     Right lower leg: No edema.     Left lower leg: No edema.  Skin:    General: Skin is warm and dry.     Capillary Refill: Capillary refill takes less than 2 seconds.  Neurological:     General: No focal deficit present.     Mental Status: He is alert. Mental status is at baseline.  Psychiatric:        Mood and Affect: Mood normal.        Behavior: Behavior normal.        Thought Content: Thought content normal.        Judgment: Judgment normal.     Arrived for initial visit for Matthew [name] who reports he isn't feeling that good today, he reports feeling very tired. Matthew [name] states he has not taken his medicines today. I reviewed all medications and filled pill box accordingly. I obtained vitals and they are as noted as well. No swelling noted, Matthew [name] denied shortness of breath, chest  pain, dizziness or trouble with daily activities. Matthew [name] stated he is unable to pay for his medications at this time and needs assistance with same. I will discuss this with Matthew [name] due to patient needing multiple refills at present.   CBG READING- 120(FASTING)  Matthew [name] assisted patient in placing more mins on his phone the clinic bought for him. He will locate it and call me letting me know he found it and can make outgoing calls.    I will see Matthew [name] in one  week in the clinic at his Pharmacy visit. Home visit complete.   Refills: Zetia Prozac Lasix Atorvastatin Carvedilol Spironolactone  Aspirin  Needs: ASA- FRI, SAT, SUN, MON PROZAC- SAT, SUN , MON     Future Appointments  Date Time Provider [place]  03/06/2021  2:00 PM [facility] PHARMACY [facility] None  03/23/2021  9:10 AM Charlott Rakes, MD [facility] None  03/23/2021 10:30 AM [facility] PA/NP [facility] None     ACTION: Home visit completed Next visit planned for ONE WEEK

## 2021-03-05 NOTE — Progress Notes (Incomplete)
***In Progress*** Referring Physician: Dr. Shan Levans PCP: Charlott Rakes, MD HF Cardiologist: Dr. Aundra Dubin  HPI:  Matthew Heman. is a 55 yo male with PMH significant for combined systolic and diastolic CHF, biventricular failure, T2DM, HTN, OSA,tobacco use, HLD, cocaine use,poor medication adherence, depression with recent suicidal ideation and admission to inpatient psychiatry, CVA, CKD 3a.   He was first admitted with heart failure symptoms in Mar 08, 2014 in the setting oflong standinguncontrolled hypertension and smoking. Had a LHC with normal cors.  Since then, has had a CVA on 01/2017 involving the corpus callosum also, punctate acute right parietal infarct.Was not followed by cardiology or hospitalized for CHF during this time period.   03/08/20 he began having CHF exacerbations after running out of medications between admissions.Left AMA in 03-09-2023. Another similar admission in July, he was out of his medications at that time as well.   Recently, he was admitted 12/2020 with CHF exacerbation after running out of his medications. Echo showed LVEF <20% with G3DD, RV moderately reduced.He was diuresed, put on an excellent medication regimen. He also endorsed suicidal ideation at that time and was subsequently admitted to behavioral health.   He was seen in [facility] on 01/24/21. He was out of several medications and rationing others. He was SOB with minimal exertion. At this visit, he was restarted on all of his medications and medication access issues resolved. He was then referred to [facility].  He returned for follow-up on 02/08/21 with Allena Katz, Ken Caryl. He was overall feeling fine. Had some dizziness. Denied increasing SOB, CP, edema, or PND/Orthopnea. Appetite was ok. No fever or chills. Weight at home 192-195 lbs. Was taking all medications. Wore CPAP at night, reported compliance. Quit smoking 3 months ago, wife passed away 2020-03-08.  Today  he returns to HF clinic for pharmacist medication titration. At last visit with APP, furosemide was decreased to 20 mg daily given recent dizziness and euvolemia on exam.   110/78, 68, 193 lbs - need BMET A) If BP ok, incr Entresto 97/103 ([employer]is) + stop furosemide - If BP is high, definitely confirm he took his meds that day. BP all over the place, likely due to compliance issues F/u: [facility] 4/7 (3 wks)  Overall feeling ***. Dizziness, lightheadedness, fatigue:  Chest pain or palpitations:  How is your breathing?: *** SOB: Able to complete all ADLs. Activity level ***  Weight at home pounds. Takes furosemide/torsemide/bumex *** mg *** daily.  LEE PND/Orthopnea  Appetite *** Low-salt diet:   Physical Exam Cost/affordability of meds    HF Medications: Carvedilol 25 mg BID Entresto 49/51 mg BID Spironolactone 25 mg daily Farxiga 10 mg daily BiDil 2 tablets TID Furosemide 20 mg daily  Has the patient been experiencing any side effects to the medications prescribed?  {YES NO:22349}  Does the patient have any problems obtaining medications due to transportation or finances?   Yes - No insurance. Maryan Puls from [name], BiDil from [employer], and [place] from [place].  Understanding of regimen: {excellent/good/fair/poor:19665} Understanding of indications: {excellent/good/fair/poor:19665} Potential of compliance: {excellent/good/fair/poor:19665} Patient understands to avoid NSAIDs. Patient understands to avoid decongestants.    Pertinent Lab Values: . Serum creatinine ***, BUN ***, Potassium ***, Sodium ***, BNP ***, Magnesium ***, Digoxin ***   Vital Signs: . Weight: *** (last clinic weight: ***) . Blood pressure: ***  . Heart rate: ***   Assessment/Plan: 1. Combined systolic and diastolic CHF Biventricular failure, Nonischemic Cardiomyopathy: - 2/2 poorly controlled  HTN, normal cors on LHC 2015, +cocaine 1/22 admission. - NYHAIII symptoms,  volume is OK today, may be slightly dry. - Continue furosemide 20 mg daily - Continue carvedilol 25 mg BID - Continue Entresto 49/51 mg BID - Continue spironolactone 25 mg daily - Continue Farxiga 10 mg daily - Continue Bidil 2 tabletsTID - BMET today. - F/u***  2. OSA: - Compliant with CPAP, continue.  3. Tobacco Use Disorder: - Stopped smoking in November 2021.  4. Cocaine Use Disorder: - UDS +on 12/2020. - Last used Sunday. Using 1-2x/month. *** - Encouraged abstinence. - HFSW helping with substance abuse resources in community.  5. CKD 3a: - BMET today.  6. HLD: - Continue atorvastatin + ezetimibe.  7. MDD: - Mood stable today. Denies SI. - Continue fluoxetine.  - Has been referred to Psychiatry per PCP.  8. T2DM: - A1C (1/22) 5.5 - Continue Farxiga, no GU symptoms.  9. Hx of CVA: - 01/2017 corpus callosum and right parietal infarct, no residual deficits - On statin & aspirin.  10. Noncompliance. - Followed by Paramedicine. - HFSW helping with community resources for substance abuse.  Richardine Service, PharmD, [place] [facility] PGY2 Cardiology Pharmacy Resident  Audry Riles, PharmD, BCPS, [facility], CPP Heart Failure Clinic Pharmacist [phone]

## 2021-03-06 ENCOUNTER — Inpatient Hospital Stay (HOSPITAL_COMMUNITY): Admission: RE | Admit: 2021-03-06 | Payer: Medicaid Other | Source: Ambulatory Visit

## 2021-03-06 ENCOUNTER — Telehealth (HOSPITAL_COMMUNITY): Payer: Self-pay

## 2021-03-06 ENCOUNTER — Other Ambulatory Visit (HOSPITAL_COMMUNITY): Payer: Self-pay | Admitting: Family Medicine

## 2021-03-06 ENCOUNTER — Other Ambulatory Visit (HOSPITAL_COMMUNITY): Payer: Self-pay | Admitting: *Deleted

## 2021-03-06 DIAGNOSIS — E782 Mixed hyperlipidemia: Secondary | ICD-10-CM

## 2021-03-06 DIAGNOSIS — I5042 Chronic combined systolic (congestive) and diastolic (congestive) heart failure: Secondary | ICD-10-CM

## 2021-03-06 MED ORDER — CARVEDILOL 25 MG PO TABS: 25.0000 mg | ORAL_TABLET | Freq: Two times a day (BID) | ORAL | 3 refills | Status: DC

## 2021-03-06 MED ORDER — ATORVASTATIN CALCIUM 80 MG PO TABS: 80.0000 mg | ORAL_TABLET | Freq: Every day | ORAL | 1 refills | Status: DC

## 2021-03-06 MED ORDER — SPIRONOLACTONE 25 MG PO TABS: 25.0000 mg | ORAL_TABLET | Freq: Every day | ORAL | 3 refills | Status: DC

## 2021-03-06 MED ORDER — FUROSEMIDE 40 MG PO TABS: 20.0000 mg | ORAL_TABLET | Freq: Every day | ORAL | 1 refills | Status: DC

## 2021-03-06 NOTE — Telephone Encounter (Signed)
Spoke to [place] who reports he wont be coming to his appointment due to not feeling well. I picked up his meds from pharmacy and will attempt to see him this week.

## 2021-03-06 NOTE — Telephone Encounter (Signed)
Called Matthew [name] to remind him of his clinic visit this afternoon and he agreed with plan and will be present. I will meet him there. Call complete.

## 2021-03-07 ENCOUNTER — Other Ambulatory Visit (HOSPITAL_COMMUNITY): Payer: Self-pay

## 2021-03-07 NOTE — Progress Notes (Signed)
Paramedicine Encounter    Patient ID: [name], male    DOB: January 21, 1966, 55 y.o.   MRN: [id number]   Patient Care Team: Charlott Rakes, MD as PCP - General (Family Medicine) Jettie Booze, MD as Consulting Physician (Cardiology) Jettie Booze, MD as Consulting Physician (Cardiology)  Patient Active Problem List   Diagnosis Date Noted  . MDD (major depressive disorder), recurrent severe, without psychosis ([place]) 01/03/2021  . Acute exacerbation of congestive heart failure ([place]) 12/24/2020  . Acute respiratory failure with hypoxia ([place]) 02/15/2020  . Acute on chronic combined systolic and diastolic CHF (congestive heart failure) ([place]) 02/15/2020  . Hypertensive urgency 02/15/2020  . CHF (congestive heart failure) ([place]) 02/15/2020  . 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 Expressive aphasia 02/20/2017  . Mixed hyperlipidemia   . History of stroke 01/31/2017  . Erectile dysfunction 07/30/2016  . OSA (obstructive sleep apnea) 05/10/2015  . CKD (chronic kidney disease) stage 2, GFR 60-89 ml/min 03/10/2014  . Diabetes mellitus type II, controlled ([place]) 03/10/2014  . Chronic combined systolic and diastolic congestive heart failure ([place]) 03/03/2014  . Smoker   . Morbid obesity ([place])   . Hypertension 03/02/2014    Current Outpatient Medications:  .  aspirin EC 81 MG tablet, Take 1 tablet (81 mg total) by mouth daily. Swallow whole., Disp: 30 tablet, Rfl: 11 .  atorvastatin (LIPITOR) 80 MG tablet, Take 1 tablet (80 mg total) by mouth daily., Disp: 30 tablet, Rfl: 1 .  carvedilol (COREG) 25 MG tablet, Take 1 tablet (25 mg total) by mouth 2 (two) times daily., Disp: 60 tablet, Rfl: 3 .  dapagliflozin propanediol (FARXIGA) 10 MG TABS tablet, Take 1 tablet (10 mg total) by mouth daily., Disp: 90 tablet, Rfl: 3 .  ezetimibe (ZETIA) 10 MG tablet, Take 1 tablet (10 mg total) by mouth daily., Disp: 7 tablet, Rfl: 0 .  FLUoxetine (PROZAC) 40 MG capsule, Take 1  capsule (40 mg total) by mouth daily., Disp: 30 capsule, Rfl: 1 .  furosemide (LASIX) 40 MG tablet, Take 0.5 tablets (20 mg total) by mouth daily., Disp: 45 tablet, Rfl: 1 .  glucose blood (ONE TOUCH ULTRA TEST) test strip, Use as instructed, Disp: 30 each, Rfl: 12 .  ibuprofen (ADVIL) 400 MG tablet, Take 1 tablet (400 mg total) by mouth every 6 (six) hours as needed for headache or moderate pain., Disp: 30 tablet, Rfl: 0 .  isosorbide-hydrALAZINE (BIDIL) 20-37.5 MG tablet, Take 2 tablets by mouth every 8 (eight) hours., Disp: 90 tablet, Rfl: 1 .  sacubitril-valsartan (ENTRESTO) 49-51 MG, Take 1 tablet by mouth 2 (two) times daily., Disp: 60 tablet, Rfl: 1 .  spironolactone (ALDACTONE) 25 MG tablet, Take 1 tablet (25 mg total) by mouth daily., Disp: 30 tablet, Rfl: 3 .  timolol (TIMOPTIC) 0.5 % ophthalmic solution, Place 1 drop into both eyes daily after breakfast., Disp: , Rfl:  No Known Allergies   Social History   Socioeconomic History  . Marital status: Widowed    Spouse name: Not on file  . Number of children: 2  . Years of education: 27  . Highest education level: Not on file  Occupational History  . Occupation: [employer]  Tobacco Use  . Smoking status: Former Smoker    Years: 20.00    Types: Cigarettes  . Smokeless tobacco: Former Systems developer    Quit date: 11/14/2020  Vaping Use  . Vaping Use: Never used  Substance and Sexual Activity  . Alcohol use:  No  . Drug use: No  . Sexual activity: Yes  Other Topics Concern  . Not on file  Social History Narrative   Lives with 2 young children in [place].  Wife died in May 27, 2020.  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   Left-handed   Caffeine: occasional tea   Social Determinants of Health   Financial Resource Strain: High Risk  . Difficulty of Paying Living Expenses: Very hard  Food Insecurity: Food Insecurity Present  . Worried About Charity fundraiser in the Last Year: Sometimes true  . Ran Out of Food  in the Last Year: Sometimes true  Transportation Needs: Unmet Transportation Needs  . Lack of Transportation (Medical): Yes  . Lack of Transportation (Non-Medical): Yes  Physical Activity: Not on file  Stress: Stress Concern Present  . Feeling of Stress : Very much  Social Connections: Not on file  Intimate Partner Violence: Not on file    Physical Exam Vitals reviewed.  Constitutional:      Appearance: Normal appearance. He is normal weight.  HENT:     Head: Normocephalic.     Nose: Nose normal.     Mouth/Throat:     Mouth: Mucous membranes are moist.     Pharynx: Oropharynx is clear.  Eyes:     Conjunctiva/sclera: Conjunctivae normal.     Pupils: Pupils are equal, round, and reactive to light.  Cardiovascular:     Rate and Rhythm: Normal rate and regular rhythm.     Pulses: Normal pulses.     Heart sounds: Normal heart sounds.  Pulmonary:     Effort: Pulmonary effort is normal.     Breath sounds: Normal breath sounds.  Abdominal:     General: Abdomen is flat.     Palpations: Abdomen is soft.  Musculoskeletal:        General: Normal range of motion.     Cervical back: Normal range of motion.     Right lower leg: No edema.     Left lower leg: No edema.  Skin:    General: Skin is warm and dry.     Capillary Refill: Capillary refill takes less than 2 seconds.  Neurological:     General: No focal deficit present.     Mental Status: He is alert. Mental status is at baseline.  Psychiatric:        Mood and Affect: Mood normal.        Behavior: Behavior normal.        Thought Content: Thought content normal.        Judgment: Judgment normal.      Arrived for home visit for Zeb who was alert and oriented reporting to be feeling good today but sleepy. Ronon reports when he takes his medications it makes him feel very tired. Dayyan was compliant with most of his medications over the last week, I encouraged him to be sure to be taking all doses as they are in his pill box.  He agreed with this plan. I reviewed medications and filled pill box accordingly. I obtained vitals as noted. No swelling, edema or difficulty breathing. Hisao denied shortness of breath, chest pain or dizziness. He reports he just gets depressed easily but denied any suicidal thoughts lately. I encouraged him to reach out to me if he needs someone to talk to and he was very appreciative of this. I reminded him of upcoming appointments and wrote them on his calendar  for April. He was agreeable. Home visit complete. I will see Mcdonald in one week.   Refills:  Zetia   WEIGHT THIS WEEK- 192LBS LAST WEEKS WEIGHT- 190LBS    Future Appointments  Date Time Provider [place]  03/23/2021  9:10 AM Charlott Rakes, MD [facility] None  03/23/2021 10:30 AM [facility] PA/NP [facility] None     ACTION: Home visit completed Next visit planned for ONE WEEK

## 2021-03-16 ENCOUNTER — Other Ambulatory Visit: Payer: Self-pay | Admitting: Internal Medicine

## 2021-03-16 DIAGNOSIS — E782 Mixed hyperlipidemia: Secondary | ICD-10-CM

## 2021-03-16 MED ORDER — EZETIMIBE 10 MG PO TABS: 10.0000 mg | ORAL_TABLET | Freq: Every day | ORAL | 0 refills | Status: DC

## 2021-03-17 ENCOUNTER — Telehealth (HOSPITAL_COMMUNITY): Payer: Self-pay | Admitting: Licensed Clinical Social Worker

## 2021-03-17 NOTE — Telephone Encounter (Signed)
CSW received call from pt who inquired about how to make his children's social security checks go to direct deposit.  CSW explained he would need to have a bank account and to make sure he had account and routing numbers then he would need to call [employer] and provide this information to a representative.  CSW had planned on calling to check on pt today as paramedic was concerned about his mental health.  Pt reports he is doing ok and has not been struggling with mental health recently but that he does have good and bad days with his physical health.  Believes his medications are causing him to feel poorly sometimes.  Pt has two appts next week and CSW encouraged him to discuss with PCP and with Heart Failure Clinic to see if any medications could explain his symptoms and need to be adjusted.  Pt reports no needs at this time  Will continue to follow and assist as needed  Matthew [name], [facility] Desk#: [phone] Cell#: [phone]

## 2021-03-18 ENCOUNTER — Other Ambulatory Visit: Payer: Self-pay

## 2021-03-19 ENCOUNTER — Other Ambulatory Visit: Payer: Self-pay

## 2021-03-19 MED FILL — Ezetimibe Tab 10 MG: ORAL | 30 days supply | Qty: 30 | Fill #0 | Status: CN

## 2021-03-20 ENCOUNTER — Other Ambulatory Visit (HOSPITAL_COMMUNITY): Payer: Self-pay

## 2021-03-20 ENCOUNTER — Telehealth (HOSPITAL_COMMUNITY): Payer: Self-pay

## 2021-03-20 NOTE — Telephone Encounter (Signed)
Spoke to Matthew [name] who reports he will see me in the clinic Thursday at 10:30 rather than a home visit today. He reports he is taking his medications and does not require my assistance with same for now. I will see him Thursday at clinic visit.

## 2021-03-22 ENCOUNTER — Telehealth (HOSPITAL_COMMUNITY): Payer: Self-pay

## 2021-03-22 NOTE — Telephone Encounter (Signed)
Spoke to [place] to remind him of appointments for tomorrow, he reports he scheduled his transportation and will be in attendance. I will meet him at the HF clinic visit and he requested to speak to [facility] during this time. I will make her aware. Call complete.

## 2021-03-23 ENCOUNTER — Other Ambulatory Visit: Payer: Self-pay

## 2021-03-23 ENCOUNTER — Telehealth (HOSPITAL_COMMUNITY): Payer: Self-pay

## 2021-03-23 ENCOUNTER — Ambulatory Visit (HOSPITAL_COMMUNITY)
Admission: RE | Admit: 2021-03-23 | Discharge: 2021-03-23 | Disposition: A | Discharge status: Home / Self Care | Payer: Self-pay | Source: Ambulatory Visit | Attending: Adult Health | Admitting: Adult Health

## 2021-03-23 ENCOUNTER — Encounter: Payer: Self-pay | Admitting: Family Medicine

## 2021-03-23 ENCOUNTER — Telehealth (HOSPITAL_COMMUNITY): Payer: Self-pay | Admitting: Cardiology

## 2021-03-23 ENCOUNTER — Other Ambulatory Visit (HOSPITAL_COMMUNITY): Payer: Self-pay

## 2021-03-23 ENCOUNTER — Ambulatory Visit: Payer: Self-pay | Attending: Family Medicine | Admitting: Family Medicine

## 2021-03-23 ENCOUNTER — Encounter (HOSPITAL_COMMUNITY): Payer: Self-pay

## 2021-03-23 ENCOUNTER — Other Ambulatory Visit (HOSPITAL_COMMUNITY): Payer: Self-pay | Admitting: *Deleted

## 2021-03-23 VITALS — BP 117/70 | HR 59 | Ht 70.0 in | Wt 192.0 lb

## 2021-03-23 VITALS — BP 112/86 | HR 61 | Wt 191.0 lb

## 2021-03-23 DIAGNOSIS — E785 Hyperlipidemia, unspecified: Secondary | ICD-10-CM | POA: Insufficient documentation

## 2021-03-23 DIAGNOSIS — Z634 Disappearance and death of family member: Secondary | ICD-10-CM

## 2021-03-23 DIAGNOSIS — Z79899 Other long term (current) drug therapy: Secondary | ICD-10-CM | POA: Insufficient documentation

## 2021-03-23 DIAGNOSIS — Z7982 Long term (current) use of aspirin: Secondary | ICD-10-CM | POA: Insufficient documentation

## 2021-03-23 DIAGNOSIS — E782 Mixed hyperlipidemia: Secondary | ICD-10-CM

## 2021-03-23 DIAGNOSIS — F329 Major depressive disorder, single episode, unspecified: Secondary | ICD-10-CM | POA: Insufficient documentation

## 2021-03-23 DIAGNOSIS — E1122 Type 2 diabetes mellitus with diabetic chronic kidney disease: Secondary | ICD-10-CM | POA: Insufficient documentation

## 2021-03-23 DIAGNOSIS — N1831 Chronic kidney disease, stage 3a: Secondary | ICD-10-CM | POA: Insufficient documentation

## 2021-03-23 DIAGNOSIS — Z8673 Personal history of transient ischemic attack (TIA), and cerebral infarction without residual deficits: Secondary | ICD-10-CM | POA: Insufficient documentation

## 2021-03-23 DIAGNOSIS — I5042 Chronic combined systolic (congestive) and diastolic (congestive) heart failure: Secondary | ICD-10-CM | POA: Insufficient documentation

## 2021-03-23 DIAGNOSIS — I5043 Acute on chronic combined systolic (congestive) and diastolic (congestive) heart failure: Secondary | ICD-10-CM

## 2021-03-23 DIAGNOSIS — Z8249 Family history of ischemic heart disease and other diseases of the circulatory system: Secondary | ICD-10-CM | POA: Insufficient documentation

## 2021-03-23 DIAGNOSIS — F0631 Mood disorder due to known physiological condition with depressive features: Secondary | ICD-10-CM

## 2021-03-23 DIAGNOSIS — Z1211 Encounter for screening for malignant neoplasm of colon: Secondary | ICD-10-CM

## 2021-03-23 DIAGNOSIS — Z7984 Long term (current) use of oral hypoglycemic drugs: Secondary | ICD-10-CM | POA: Insufficient documentation

## 2021-03-23 DIAGNOSIS — E1121 Type 2 diabetes mellitus with diabetic nephropathy: Secondary | ICD-10-CM

## 2021-03-23 DIAGNOSIS — I130 Hypertensive heart and chronic kidney disease with heart failure and stage 1 through stage 4 chronic kidney disease, or unspecified chronic kidney disease: Secondary | ICD-10-CM | POA: Insufficient documentation

## 2021-03-23 DIAGNOSIS — E7800 Pure hypercholesterolemia, unspecified: Secondary | ICD-10-CM | POA: Insufficient documentation

## 2021-03-23 DIAGNOSIS — I5023 Acute on chronic systolic (congestive) heart failure: Secondary | ICD-10-CM

## 2021-03-23 DIAGNOSIS — F1410 Cocaine abuse, uncomplicated: Secondary | ICD-10-CM | POA: Insufficient documentation

## 2021-03-23 DIAGNOSIS — I428 Other cardiomyopathies: Secondary | ICD-10-CM | POA: Insufficient documentation

## 2021-03-23 DIAGNOSIS — N182 Chronic kidney disease, stage 2 (mild): Secondary | ICD-10-CM

## 2021-03-23 DIAGNOSIS — G4733 Obstructive sleep apnea (adult) (pediatric): Secondary | ICD-10-CM | POA: Insufficient documentation

## 2021-03-23 DIAGNOSIS — Z87891 Personal history of nicotine dependence: Secondary | ICD-10-CM | POA: Insufficient documentation

## 2021-03-23 DIAGNOSIS — I110 Hypertensive heart disease with heart failure: Secondary | ICD-10-CM

## 2021-03-23 DIAGNOSIS — I5082 Biventricular heart failure: Secondary | ICD-10-CM | POA: Insufficient documentation

## 2021-03-23 LAB — BASIC METABOLIC PANEL
Anion gap: 5 (ref 5–15)
BUN: 18 mg/dL (ref 6–20)
CO2: 26 mmol/L (ref 22–32)
Calcium: 9 mg/dL (ref 8.9–10.3)
Chloride: 105 mmol/L (ref 98–111)
Creatinine, Ser: 1.64 mg/dL — ABNORMAL HIGH (ref 0.61–1.24)
GFR, Estimated: 49 mL/min — ABNORMAL LOW (ref >60)
Glucose, Bld: 136 mg/dL — ABNORMAL HIGH (ref 70–99)
Potassium: 3.8 mmol/L (ref 3.5–5.1)
Sodium: 136 mmol/L (ref 135–145)

## 2021-03-23 MED ORDER — EZETIMIBE 10 MG PO TABS
10.0000 mg | ORAL_TABLET | Freq: Every day | ORAL | 3 refills | Status: DC
  Filled 2021-03-23: qty 30, 30d supply, fill #0

## 2021-03-23 MED ORDER — FLUOXETINE HCL 40 MG PO CAPS
ORAL_CAPSULE | Freq: Every day | ORAL | 6 refills | Status: DC
  Filled 2021-03-23 – 2021-04-25 (×2): qty 30, 30d supply, fill #0
  Filled 2021-06-05 – 2021-06-14 (×2): qty 30, 30d supply, fill #1
  Filled 2021-08-18: qty 30, 30d supply, fill #2

## 2021-03-23 NOTE — Progress Notes (Signed)
Paramedicine Encounter    Patient ID: Matthew Heman., male    DOB: 1966/07/19, 55 y.o.   MRN: [id number]   Patient Care Team: Charlott Rakes, MD as PCP - General (Family Medicine) Jettie Booze, MD as Consulting Physician (Cardiology) Jettie Booze, MD as Consulting Physician (Cardiology) Jorge Ny, LCSW as Social Worker (Licensed Clinical Social Worker)  Patient Active Problem List   Diagnosis Date Noted  . MDD (major depressive disorder), recurrent severe, without psychosis ([place]) 01/03/2021  . Acute exacerbation of congestive heart failure ([place]) 12/24/2020  . Acute respiratory failure with hypoxia ([place]) 02/15/2020  . Acute on chronic combined systolic and diastolic CHF (congestive heart failure) ([place]) 02/15/2020  . Hypertensive urgency 02/15/2020  . CHF (congestive heart failure) ([place]) 02/15/2020  . 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 Expressive aphasia 02/20/2017  . Mixed hyperlipidemia   . History of stroke 01/31/2017  . Erectile dysfunction 07/30/2016  . OSA (obstructive sleep apnea) 05/10/2015  . CKD (chronic kidney disease) stage 2, GFR 60-89 ml/min 03/10/2014  . Diabetes mellitus type II, controlled ([place]) 03/10/2014  . Chronic combined systolic and diastolic congestive heart failure ([place]) 03/03/2014  . Smoker   . Morbid obesity ([place])   . Hypertension 03/02/2014    Current Outpatient Medications:  .  aspirin EC 81 MG tablet, Take 1 tablet (81 mg total) by mouth daily. Swallow whole., Disp: 30 tablet, Rfl: 11 .  atorvastatin (LIPITOR) 80 MG tablet, TAKE 1 TABLET BY MOUTH DAILY., Disp: 7 tablet, Rfl: 0 .  carvedilol (COREG) 25 MG tablet, TAKE 1 TABLET (25 MG TOTAL) BY MOUTH 2 (TWO) TIMES DAILY., Disp: 60 tablet, Rfl: 3 .  clonazePAM (KLONOPIN) 0.5 MG tablet, TAKE 1 TABLET (0.5 MG TOTAL) BY MOUTH AT BEDTIME AS NEEDED (SLEEP, ANXIETY). (Patient not taking: Reported on 03/23/2021), Disp: 30 tablet, Rfl: 0 .  dapagliflozin propanediol (FARXIGA)  10 MG TABS tablet, Take 1 tablet (10 mg total) by mouth daily., Disp: 90 tablet, Rfl: 3 .  ezetimibe (ZETIA) 10 MG tablet, Take 1 tablet (10 mg total) by mouth daily., Disp: 90 tablet, Rfl: 3 .  FLUoxetine (PROZAC) 40 MG capsule, TAKE 1 CAPSULE (40 MG TOTAL) BY MOUTH DAILY., Disp: 30 capsule, Rfl: 6 .  glucose blood (ONE TOUCH ULTRA TEST) test strip, Use as instructed, Disp: 30 each, Rfl: 12 .  ibuprofen (ADVIL) 400 MG tablet, Take 1 tablet (400 mg total) by mouth every 6 (six) hours as needed for headache or moderate pain., Disp: 30 tablet, Rfl: 0 .  isosorbide-hydrALAZINE (BIDIL) 20-37.5 MG tablet, TAKE 2 TABLETS BY MOUTH EVERY 8 (EIGHT) HOURS. [place] UNTIL 01/03/22, Disp: 90 tablet, Rfl: 1 .  sacubitril-valsartan (ENTRESTO) 49-51 MG, Take 1 tablet by mouth 2 (two) times daily., Disp: 60 tablet, Rfl: 1 .  spironolactone (ALDACTONE) 25 MG tablet, TAKE 1 TABLET BY MOUTH ONCE DAILY., Disp: 34 tablet, Rfl: 1 .  timolol (TIMOPTIC) 0.5 % ophthalmic solution, Place 1 drop into both eyes daily after breakfast. (Patient not taking: Reported on 03/23/2021), Disp: , Rfl:  No Known Allergies   Social History   Socioeconomic History  . Marital status: Widowed    Spouse name: Not on file  . Number of children: 2  . Years of education: 65  . Highest education level: Not on file  Occupational History  . Occupation: [employer]  Tobacco Use  . Smoking status: Former Smoker    Years: 20.00  Types: Cigarettes  . Smokeless tobacco: Former Systems developer    Quit date: 11/14/2020  Vaping Use  . Vaping Use: Never used  Substance and Sexual Activity  . Alcohol use: No  . Drug use: No  . Sexual activity: Yes  Other Topics Concern  . Not on file  Social History Narrative   Lives with 2 young children in [place].  Wife died in May 09, 2020.  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   Left-handed   Caffeine: occasional tea   Social Determinants of Health    Financial Resource Strain: High Risk  . Difficulty of Paying Living Expenses: Very hard  Food Insecurity: Food Insecurity Present  . Worried About Charity fundraiser in the Last Year: Sometimes true  . Ran Out of Food in the Last Year: Sometimes true  Transportation Needs: Unmet Transportation Needs  . Lack of Transportation (Medical): Yes  . Lack of Transportation (Non-Medical): Yes  Physical Activity: Not on file  Stress: Stress Concern Present  . Feeling of Stress : Very much  Social Connections: Not on file  Intimate Partner Violence: Not on file    Physical Exam    Met with Matthew [name] in the clinic today where he was seen by Matthew Grinder PA, where he was taken off of his Lasix and was encouraged to use his CPAP at night time. Matthew [name] admitted today to cocaine use and reports he would like assistance with counseling for drug abuse and grief counseling. Matthew [name] also expressed concerns with food insecurity. Matthew Sours LCSW assisting with this.   I reviewed medications and set up pill box accordingly. I will see Matthew [name] in one week in the home.   Matthew [name] will follow up with Matthew [name] in 4-5 weeks.   Update- Matthew [name] called me at 1400 asking for an appointment with primary care for an ear ache. I am assisting in getting one scheduled.   WEIGHT- 191LBS BP- 112/86    ACTION: Home visit completed Next visit planned for one week

## 2021-03-23 NOTE — Progress Notes (Signed)
Subjective:  Patient ID: Matthew Heman., male    DOB: [birth year]  Age: 55 y.o. 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 She passed in 02/2020 when he was hositalized.  [facility]. He has been having off and off chest pain. He gets dyspneic on mild exertion but endorses compliance with all his cardiac medications.  He has no pedal edema.  With regards to his diabetes mellitus he denies complains of hypoglycemia and is doing well on Farxiga.  He denies presence of blurry vision or numbness in extremities. Past Medical History:  Diagnosis Date  . Acute combined systolic and diastolic CHF, NYHA class 3 ([place])    a. 02/2014 Echo: EF 25-30%.  . Cardiomyopathy ([place])    a. 02/2014 Echo: EF 25-30%, sev glob HK with inferolat HK->AK, mod conc LVH, Gr 2 DD, Mild MR, sev dil LA.  . Depression   . DM (diabetes mellitus) ([place])   . High cholesterol   . Hypertension   . Morbid obesity ([place])   . Tobacco abuse     Past Surgical History:  Procedure Laterality Date  . CATARACT EXTRACTION Right   . LEFT HEART CATHETERIZATION WITH CORONARY ANGIOGRAM N/A 03/04/2014   Procedure: LEFT HEART CATHETERIZATION WITH CORONARY ANGIOGRAM;  Surgeon: Burnell Blanks, MD;  [facility] C[facility] LAB;  Service: Cardiovascular;  Laterality: N/A;  . TEE WITHOUT CARDIOVERSION N/A 02/04/2017   Procedure: TRANSESOPHAGEAL ECHOCARDIOGRAM (TEE);  Surgeon: Dorothy Spark, MD;  Location: [facility] ENDOSCOPY;   Service: Cardiovascular;  Laterality: N/A;    Family History  Problem Relation Age of Onset  . Heart attack Father        died @ 58  . Cervical cancer Mother        died @ 9    No Known Allergies  Outpatient Medications Prior to Visit  Medication Sig Dispense Refill  . aspirin EC 81 MG tablet Take 1 tablet (81 mg total) by mouth daily. Swallow whole. 30 tablet 11  . atorvastatin (LIPITOR) 80 MG tablet TAKE 1 TABLET BY MOUTH DAILY. 7 tablet 0  . carvedilol (COREG) 25 MG tablet TAKE 1 TABLET (25 MG TOTAL) BY MOUTH 2 (TWO) TIMES DAILY. 60 tablet 3  . clonazePAM (KLONOPIN) 0.5 MG tablet TAKE 1 TABLET (0.5 MG TOTAL) BY MOUTH AT BEDTIME AS NEEDED (SLEEP, ANXIETY). (Patient not taking: Reported on 03/23/2021) 30 tablet 0  . dapagliflozin propanediol (FARXIGA) 10 MG TABS tablet Take 1 tablet (10 mg total) by mouth daily. 90 tablet 3  . ibuprofen (ADVIL) 400 MG tablet Take 1 tablet (400 mg total) by mouth every 6 (six) hours as needed for headache or moderate pain. 30 tablet 0  . isosorbide-hydrALAZINE (BIDIL) 20-37.5 MG tablet TAKE 2 TABLETS BY MOUTH EVERY 8 (EIGHT) HOURS. EPIC [place] UNTIL 01/03/22 90 tablet 1  . sacubitril-valsartan (ENTRESTO) 49-51 MG Take 1 tablet by mouth 2 (two) times daily. 60 tablet  1  . spironolactone (ALDACTONE) 25 MG tablet TAKE 1 TABLET BY MOUTH ONCE DAILY. 34 tablet 1  . timolol (TIMOPTIC) 0.5 % ophthalmic solution Place 1 drop into both eyes daily after breakfast.    . atorvastatin (LIPITOR) 80 MG tablet TAKE 1 TABLET (80 MG TOTAL) BY MOUTH DAILY. 30 tablet 1  . carvedilol (COREG) 25 MG tablet TAKE 1 TABLET BY MOUTH TWICE DAILY. 68 tablet 1  . clonazePAM (KLONOPIN) 0.5 MG tablet TAKE 1 TABLET BY MOUTH AT BEDTIME AS NEEDED (SLEEP, ANXIETY). 7 tablet 0  . dapagliflozin propanediol (FARXIGA) 10 MG TABS tablet TAKE 1 TABLET BY MOUTH DAILY. 7 tablet 0  . ezetimibe (ZETIA) 10 MG tablet Take 1 tablet (10 mg total) by mouth daily. 30  tablet 0  . ezetimibe (ZETIA) 10 MG tablet TAKE 1 TABLET BY MOUTH DAILY. 7 tablet 0  . ezetimibe (ZETIA) 10 MG tablet TAKE 1 TABLET (10 MG TOTAL) BY MOUTH DAILY. 30 tablet 0  . FLUoxetine (PROZAC) 40 MG capsule Take 1 capsule (40 mg total) by mouth daily. 30 capsule 1  . FLUoxetine (PROZAC) 40 MG capsule TAKE 1 CAPSULE BY MOUTH DAILY. 7 capsule 0  . FLUoxetine (PROZAC) 40 MG capsule TAKE 1 CAPSULE (40 MG TOTAL) BY MOUTH DAILY. 30 capsule 1  . FLUoxetine (PROZAC) 40 MG capsule TAKE 1 CAPSULE (40 MG TOTAL) BY MOUTH DAILY. 30 capsule 1  . FLUoxetine (PROZAC) 40 MG capsule TAKE 1 CAPSULE (40 MG TOTAL) BY MOUTH DAILY. 30 capsule 0  . furosemide (LASIX) 40 MG tablet TAKE 1 TABLET BY MOUTH DAILY. 7 tablet 0  . furosemide (LASIX) 40 MG tablet TAKE 0.5 TABLETS (20 MG TOTAL) BY MOUTH DAILY. 45 tablet 1  . furosemide (LASIX) 40 MG tablet TAKE 1 TABLET BY MOUTH ONCE DAILY. (Patient not taking: Reported on 03/23/2021) 30 tablet 2  . furosemide (LASIX) 40 MG tablet TAKE 0.5 TABLETS (20 MG TOTAL) BY MOUTH DAILY. 30 tablet 1  . furosemide (LASIX) 40 MG tablet TAKE 1 TABLET (40 MG TOTAL) BY MOUTH DAILY. (Patient not taking: Reported on 03/23/2021) 30 tablet 1  . ibuprofen (ADVIL) 400 MG tablet TAKE 1 TABLET (400 MG TOTAL) BY MOUTH EVERY SIX HOURS AS NEEDED FOR HEADACHE OR MODERATE PAIN. 30 tablet 0  . isosorbide-hydrALAZINE (BIDIL) 20-37.5 MG tablet Take 2 tablets by mouth every 8 (eight) hours. 90 tablet 1  . isosorbide-hydrALAZINE (BIDIL) 20-37.5 MG tablet TAKE 2 TABLETS BY MOUTH EVERY 8 HOURS. 42 tablet 0  . sacubitril-valsartan (ENTRESTO) 49-51 MG TAKE 1 TABLET BY MOUTH 2 TIMES DAILY. 14 tablet 0  . sacubitril-valsartan (ENTRESTO) 49-51 MG TAKE 1 TABLET BY MOUTH 2 (TWO) TIMES DAILY. EPIC NOTE:PASS THRU HEART FAILURE UNTIL 01/16/22 PT AWARE 60 tablet 1  . spironolactone (ALDACTONE) 25 MG tablet TAKE 1 TABLET (25 MG TOTAL) BY MOUTH DAILY. 30 tablet 3  . glucose blood (ONE TOUCH ULTRA TEST) test strip Use as  instructed 30 each 12   No facility-administered medications prior to visit.     ROS Review of Systems  Constitutional: Negative for activity change and appetite change.  HENT: Negative for sinus pressure and sore throat.   Eyes: Negative for visual disturbance.  Respiratory: Positive for shortness of breath. Negative for cough and chest tightness.   Cardiovascular: Negative for chest pain and leg swelling.  Gastrointestinal: Negative for abdominal distention, abdominal pain, constipation and diarrhea.  Endocrine: Negative.   Genitourinary: Negative for dysuria.  Musculoskeletal: Negative for joint swelling and myalgias.  Skin:  Negative for rash.  Allergic/Immunologic: Negative.   Neurological: Negative for weakness, light-headedness and numbness.  Psychiatric/Behavioral: Positive for dysphoric mood. Negative for suicidal ideas.    Objective:  BP 117/70   Pulse (!) 59   Ht 5\' 10"  (1.778 m)   Wt 192 lb (87.1 kg)   SpO2 99%   BMI 27.55 kg/m   BP/Weight 03/23/2021 03/23/2021 2/87/8676  Systolic BP 720 947 096  Diastolic BP 86 70 60  Wt. (Lbs) 191 192 192  BMI 27.41 27.55 27.55  Some encounter information is confidential and restricted. Go to Review Flowsheets activity to see all data.      Physical Exam Constitutional:      Appearance: He is well-developed.  Neck:     Vascular: No JVD.  Cardiovascular:     Rate and Rhythm: Normal rate.     Heart sounds: Normal heart sounds. No murmur heard.   Pulmonary:     Effort: Pulmonary effort is normal.     Breath sounds: Normal breath sounds. No wheezing or rales.  Chest:     Chest wall: No tenderness.  Abdominal:     General: Bowel sounds are normal. There is no distension.     Palpations: Abdomen is soft. There is no mass.     Tenderness: There is no abdominal tenderness.  Musculoskeletal:        General: Normal range of motion.     Right lower leg: No edema.     Left lower leg: No edema.  Neurological:     Mental  Status: He is alert and oriented to person, place, and time.  Psychiatric:     Comments: Dysphoric mood     CMP Latest Ref Rng & Units 02/08/2021 01/24/2021 01/03/2021  Glucose 70 - 99 mg/dL 111(H) 107(H) 95  BUN 6 - 20 mg/dL 19 12 23(H)  Creatinine 0.61 - 1.24 mg/dL 1.65(H) 1.17 1.44(H)  Sodium 135 - 145 mmol/L 139 139 137  Potassium 3.5 - 5.1 mmol/L 3.7 3.8 4.1  Chloride 98 - 111 mmol/L 105 106 106  CO2 22 - 32 mmol/L 23 23 21(L)  Calcium 8.9 - 10.3 mg/dL 9.1 8.6(L) 8.6(L)  Total Protein 6.5 - 8.1 g/dL - - -  Total Bilirubin 0.3 - 1.2 mg/dL - - -  Alkaline Phos 38 - 126 U/L - - -  AST 15 - 41 U/L - - -  ALT 0 - 44 U/L - - -    Lipid Panel     Component Value Date/Time   CHOL 97 12/27/2020 0143   CHOL 144 03/07/2018 0834   TRIG 67 12/27/2020 0143   HDL 35 (L) 12/27/2020 0143   HDL 36 (L) 03/07/2018 0834   CHOLHDL 2.8 12/27/2020 0143   VLDL 13 12/27/2020 0143   LDLCALC 49 12/27/2020 0143   LDLCALC 73 03/07/2018 0834    CBC    Component Value Date/Time   WBC 5.4 12/26/2020 0125   RBC 5.03 12/26/2020 0125   HGB 15.6 12/26/2020 0125   HCT 44.0 12/26/2020 0125   PLT 197 12/26/2020 0125   MCV 87.5 12/26/2020 0125   MCH 31.0 12/26/2020 0125   MCHC 35.5 12/26/2020 0125   RDW 12.8 12/26/2020 0125   LYMPHSABS 1.9 12/26/2020 0125   MONOABS 0.7 12/26/2020 0125   EOSABS 0.1 12/26/2020 0125   BASOSABS 0.0 12/26/2020 0125    Lab Results  Component Value Date   HGBA1C 5.5 12/27/2020    Assessment & Plan:  1. Hypertensive heart  disease with acute on chronic combined systolic and diastolic congestive heart failure (HCC) EF of less than 20%, NYHA III Euvolemic Currently on Entresto, spironolactone, Farxiga  2. Controlled type 2 diabetes mellitus with diabetic nephropathy, without long-term current use of insulin (HCC) Controlled with A1c of 5.5; goal A1c is less than 7.0 Doing well on Farxiga Counseled on Diabetic diet, my plate method, 081 minutes of moderate intensity  exercise/week Blood sugar logs with fasting goals of 80-120 mg/dl, random of less than 180 and in the event of sugars less than 60 mg/dl or greater than 400 mg/dl encouraged to notify the clinic. Advised on the need for annual eye exams, annual foot exams, Pneumonia vaccine.   3. Bereavement He will benefit from grief counseling I have had the Case Manager contact his social worker at [employer] to set up grief counseling for him.  4. Depression due to physical illness Underlying bereavement contributing He will benefit from grief counseling which his social worker  - FLUoxetine (PROZAC) 40 MG capsule; TAKE 1 CAPSULE (40 MG TOTAL) BY MOUTH DAILY.  Dispense: 30 capsule; Refill: 6  5. Screening for colon cancer - Fecal occult blood, imunochemical([facility])    Meds ordered this encounter  Medications  . FLUoxetine (PROZAC) 40 MG capsule    Sig: TAKE 1 CAPSULE (40 MG TOTAL) BY MOUTH DAILY.    Dispense:  30 capsule    Refill:  6    Follow-up: Return in about 3 months (around 06/22/2021) for Medical conditions.       Charlott Rakes, MD, FAAFP. [facility] and [place], [place]   03/23/2021, 11:12 AM

## 2021-03-23 NOTE — Patient Instructions (Signed)
Stop lasix  Labs today We will only contact you if something comes back abnormal or we need to make some changes. Otherwise no news is good news!  Your physician recommends that you schedule a follow-up appointment in: 4-5 weeks with Dr Aundra Dubin and echo  Your physician has requested that you have an echocardiogram. Echocardiography is a painless test that uses sound waves to create images of your heart. It provides your doctor with information about the size and shape of your heart and how well your heart's chambers and valves are working. This procedure takes approximately one hour. There are no restrictions for this procedure.  Do the following things EVERYDAY: 1) Weigh yourself in the morning before breakfast. Write it down and keep it in a log. 2) Take your medicines as prescribed 3) Eat low salt foods--Limit salt (sodium) to 2000 mg per day.  4) Stay as active as you can everyday 5) Limit all fluids for the day to less than 2 liters /At the [facility], you and your health needs are our priority. As part of our continuing mission to provide you with exceptional heart care, we have created designated Provider Care Teams. These Care Teams include your primary Cardiologist (physician) and Advanced Practice Providers (APPs- Physician Assistants and Nurse Practitioners) who all work together to provide you with the care you need, when you need it.   You may see any of the following providers on your designated Care Team at your next follow up: Marland Kitchen Dr Glori Bickers . Dr Loralie Champagne . Dr Vickki Muff . Darrick Grinder, NP . Lyda Jester, [place] . Audry Riles, PharmD   Please be sure to bring in all your medications bottles to every appointment.

## 2021-03-23 NOTE — Patient Instructions (Signed)
Managing Loss, Adult People experience loss in many different ways throughout their lives. Events such as moving, changing jobs, and losing friends can create a sense of loss. The loss may be as serious as a major health change, divorce, death of a pet, or death of a loved one. All of these types of loss are likely to create a physical and emotional reaction known as grief. Grief is the result of a major change or an absence of something or someone that you count on. Grief is a normal reaction to loss. A variety of factors can affect your grieving experience, including:  The nature of your loss.  Your relationship to what or whom you lost.  Your understanding of grief and how to manage it.  Your support system. How to manage lifestyle changes Keep to your normal routine as much as possible.  If you have trouble focusing or doing normal activities, it is acceptable to take some time away from your normal routine.  Spend time with friends and loved ones.  Eat a healthy diet, get plenty of sleep, and rest when you feel tired.   How to recognize changes  The way that you deal with your grief will affect your ability to function as you normally do. When grieving, you may experience these changes:  Numbness, shock, sadness, anxiety, anger, denial, and guilt.  Thoughts about death.  Unexpected crying.  A physical sensation of emptiness in your stomach.  Problems sleeping and eating.  Tiredness (fatigue).  Loss of interest in normal activities.  Dreaming about or imagining seeing the person who died.  A need to remember what or whom you lost.  Difficulty thinking about anything other than your loss for a period of time.  Relief. If you have been expecting the loss for a while, you may feel a sense of relief when it happens. Follow these instructions at home: Activity Express your feelings in healthy ways, such as:  Talking with others about your loss. It may be helpful to find  others who have had a similar loss, such as a support group.  Writing down your feelings in a journal.  Doing physical activities to release stress and emotional energy.  Doing creative activities like painting, sculpting, or playing or listening to music.  Practicing resilience. This is the ability to recover and adjust after facing challenges. Reading some resources that encourage resilience may help you to learn ways to practice those behaviors.   General instructions  Be patient with yourself and others. Allow the grieving process to happen, and remember that grieving takes time. ? It is likely that you may never feel completely done with some grief. You may find a way to move on while still cherishing memories and feelings about your loss. ? Accepting your loss is a process. It can take months or longer to adjust.  Keep all follow-up visits as told by your health care provider. This is important. Where to find support To get support for managing loss:  Ask your health care provider for help and recommendations, such as grief counseling or therapy.  Think about joining a support group for people who are managing a loss. Where to find more information You can find more information about managing loss from:  American Society of Clinical Oncology: www.cancer.net  American Psychological Association: www.apa.org Contact a health care provider if:  Your grief is extreme and keeps getting worse.  You have ongoing grief that does not improve.  Your body shows symptoms   of grief, such as illness.  You feel depressed, anxious, or lonely. Get help right away if:  You have thoughts about hurting yourself or others. If you ever feel like you may hurt yourself or others, or have thoughts about taking your own life, get help right away. You can go to your nearest emergency department or call:  Your local emergency services (911 in the [place]  A suicide crisis helpline, such as the  National Suicide Prevention Lifeline at 1-800-273-8255. This is open 24 hours a day. Summary  Grief is the result of a major change or an absence of someone or something that you count on. Grief is a normal reaction to loss.  The depth of grief and the period of recovery depend on the type of loss and your ability to adjust to the change and process your feelings.  Processing grief requires patience and a willingness to accept your feelings and talk about your loss with people who are supportive.  It is important to find resources that work for you and to realize that people experience grief differently. There is not one grieving process that works for everyone in the same way.  Be aware that when grief becomes extreme, it can lead to more severe issues like isolation, depression, anxiety, or suicidal thoughts. Talk with your health care provider if you have any of these issues. This information is not intended to replace advice given to you by your health care provider. Make sure you discuss any questions you have with your health care provider. Document Revised: 05/26/2020 Document Reviewed: 05/26/2020 Elsevier Patient Education  2021 Elsevier Inc.  

## 2021-03-23 NOTE — Telephone Encounter (Signed)
appt call reminder placed Pt aware of appt reports he will be here shortly

## 2021-03-23 NOTE — Telephone Encounter (Signed)
Spoke to Matthew [name] in regards to follow up about his ear pain. I provided him the follow up center located at [address]  provided to me by Opal Sidles at [facility].   Number for center given to patient- [phone]   [place] was made aware to call them and set up an appointment and to schedule his transportation through [facility] transport. He was agreeable and understood. Call complete.

## 2021-03-23 NOTE — Progress Notes (Signed)
Advanced Heart Failure Clinic Note    PCP: Charlott Rakes, MD HF Cardiologist: Dr. Aundra Dubin  HPI: Matthew Petersonis a 54 yo male with PMH significant for combined systolic and diastolic CHF, biventricular failure, T2DM, HTN, OSA, tobacco use, HLD, cocaine use, poor medication adherence, depression,  CVA, and CKD 3a.     He was first admitted with heart failure symptoms in 2015 in the setting of long standing uncontrolled hypertension and smoking.  Had a LHC with normal cors.   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  Was not followed by cardiology or hospitalized for CHF during this time period.    March 2021 he began having CHF exacerbations after running out of meds between admissions.  Left AMA in March. Another similar admission in July, he was out of his medications at that time as well.    Recently, he was admitted 12/2020 with CHF exacerbation after running out of his medications.  He was diuresed, put on an excellent medication regimen. He also endorsed suicidal ideation at that time and was subsequently admitted to behavioral health.   He was seen in the HF clinic 02/08/21 and lasix was cut back to 20 mg daily.   Today he returns for HF follow up. Overall feeling tired.  Having right ear discomfort. SOB with exertion. Denies PND/Orthopnea. Appetite poor. Says he doesn't have much food. No fever or chills. Weight at home 190-194 pounds. Not using CPAP.  Using cocaine multiple times a week. Taking all medications. Currently not working. Followed by HF Paramedicine.    Social: Requires assistance with transportation. He has 54 year old and 101 year old daughter living with him on the weekend. Requires transportation assistance.    ECHO 12/2020 EF< 20% RV moderately reduced.  ECHO 2021 EF 40-45%   2015 LHC with normal cors.     Past Medical History:  Diagnosis Date  . Acute combined systolic and  diastolic CHF, NYHA class 3 ([place])    a. 02/2014 Echo: EF 25-30%.  . Cardiomyopathy ([place])    a. 02/2014 Echo: EF 25-30%, sev glob HK with inferolat HK->AK, mod conc LVH, Gr 2 DD, Mild MR, sev dil LA.  . Depression   . DM (diabetes mellitus) ([place])   . High cholesterol   . Hypertension   . Morbid obesity ([place])   . Tobacco abuse     Current Outpatient Medications  Medication Sig Dispense Refill  . aspirin EC 81 MG tablet Take 1 tablet (81 mg total) by mouth daily. Swallow whole. 30 tablet 11  . atorvastatin (LIPITOR) 80 MG tablet TAKE 1 TABLET BY MOUTH DAILY. 7 tablet 0  . carvedilol (COREG) 25 MG tablet TAKE 1 TABLET (25 MG TOTAL) BY MOUTH 2 (TWO) TIMES DAILY. 60 tablet 3  . dapagliflozin propanediol (FARXIGA) 10 MG TABS tablet Take 1 tablet (10 mg total) by mouth daily. 90 tablet 3  . ezetimibe (ZETIA) 10 MG tablet Take 1 tablet (10 mg total) by mouth daily. 90 tablet 3  . FLUoxetine (PROZAC) 40 MG capsule TAKE 1 CAPSULE (40 MG TOTAL) BY MOUTH DAILY. 30 capsule 6  . furosemide (LASIX) 40 MG tablet TAKE 0.5 TABLETS (20 MG TOTAL) BY MOUTH DAILY. 30 tablet 1  . glucose blood (ONE TOUCH ULTRA TEST) test strip Use as instructed 30 each 12  . ibuprofen (ADVIL) 400 MG tablet Take 1 tablet (400 mg total) by mouth every 6 (six) hours  as needed for headache or moderate pain. 30 tablet 0  . isosorbide-hydrALAZINE (BIDIL) 20-37.5 MG tablet TAKE 2 TABLETS BY MOUTH EVERY 8 (EIGHT) HOURS. EPIC [place] UNTIL 01/03/22 90 tablet 1  . sacubitril-valsartan (ENTRESTO) 49-51 MG Take 1 tablet by mouth 2 (two) times daily. 60 tablet 1  . spironolactone (ALDACTONE) 25 MG tablet TAKE 1 TABLET BY MOUTH ONCE DAILY. 34 tablet 1  . clonazePAM (KLONOPIN) 0.5 MG tablet TAKE 1 TABLET (0.5 MG TOTAL) BY MOUTH AT BEDTIME AS NEEDED (SLEEP, ANXIETY). (Patient not taking: Reported on 2021/03/24) 30 tablet 0  . timolol (TIMOPTIC) 0.5 % ophthalmic solution Place 1 drop into both eyes daily after  breakfast. (Patient not taking: Reported on 03-24-21)     No current facility-administered medications for this encounter.    No Known Allergies    Social History   Socioeconomic History  . Marital status: Widowed    Spouse name: Not on file  . Number of children: 2  . Years of education: 91  . Highest education level: Not on file  Occupational History  . Occupation: [employer]  Tobacco Use  . Smoking status: Former Smoker    Years: 20.00    Types: Cigarettes  . Smokeless tobacco: Former Systems developer    Quit date: 11/14/2020  Vaping Use  . Vaping Use: Never used  Substance and Sexual Activity  . Alcohol use: No  . Drug use: No  . Sexual activity: Yes  Other Topics Concern  . Not on file  Social History Narrative   Lives with 2 young children in [place].  Wife died in 2020/05/06.  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   Left-handed   Caffeine: occasional tea   Social Determinants of Health   Financial Resource Strain: High Risk  . Difficulty of Paying Living Expenses: Very hard  Food Insecurity: Food Insecurity Present  . Worried About Charity fundraiser in the Last Year: Sometimes true  . Ran Out of Food in the Last Year: Sometimes true  Transportation Needs: Unmet Transportation Needs  . Lack of Transportation (Medical): Yes  . Lack of Transportation (Non-Medical): Yes  Physical Activity: Not on file  Stress: Stress Concern Present  . Feeling of Stress : Very much  Social Connections: Not on file  Intimate Partner Violence: Not on file    Family History  Problem Relation Age of Onset  . Heart attack Father        died @ 68  . Cervical cancer Mother        died @ 33    Vitals:   24-Mar-2021 1003  BP: 112/86  Pulse: 61  SpO2: 100%  Weight: 86.6 kg (191 lb)   Wt Readings from Last 3 Encounters:  2021/03/24 86.6 kg (191 lb)  24-Mar-2021 87.1 kg (192 lb)  03/07/21 87.1 kg (192 lb)   PHYSICAL EXAM: General:  Well appearing. No resp  difficulty HEENT: normal Neck: supple. no JVD. Carotids 2+ bilat; no bruits. No lymphadenopathy or thryomegaly appreciated. Cor: PMI nondisplaced. Regular rate & rhythm. No rubs, gallops or murmurs. Lungs: clear Abdomen: soft, nontender, nondistended. No hepatosplenomegaly. No bruits or masses. Good bowel sounds. Extremities: no cyanosis, clubbing, rash, edema Neuro: alert & orientedx3, cranial nerves grossly intact. moves all 4 extremities w/o difficulty. Affect pleasant  ASSESSMENT & PLAN: 1. Combined systolic and diastolic CHF Biventricular failure, Nonischemic Cardiomyopathy: - 2/2 poorly controlled HTN, normal cors on LHC 2015, +  cocaine 1/22 admission. - ECHO 12/24/2020 EF  < 20% Grade III DD - NYHA III. Stop lasix and hopefully this will help with dizziness.   - Continue Entresto 49/51 mg bid. - Continue carvedilol 25 mg bid. - Continue Farxiga 10 mg daily. - Continue spiro 25 mg daily. - Continue Bidil 2 tabs tid. - CHeck BMET  - Set up for repeat ECHO>   2. OSA: - He has not been using CPAP because he doesn't have distilled water. Asked him to pick up at the grocery store today.  - Stressed compliance.   3. Tobacco Use Disorder: - Stopped smoking in November 2021.  4. Cocaine Use Disorder: - UDS + on 12/2020. - Using cocaine at a minimum weekly.   - Discussed abstinence.  - HFSW helping with substance abuse resources in community.  5. CKD 3a: - Check BMET   6. HLD: - Continue atorvastatin + Zetia.  7. MDD: - Mood stable today. Denies SI. - Continue Prozac.  - Has been referred to Psychiatry per PCP.  8. T2DM: - A1C (1/22) 5.5 - Continue Farxiga, no GU symptoms.  9. 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  10. Social  - Continue HF Paramedicine.  -HF SW helping with community resources for substance abuse. - Has food stamps. Needs transportation assistance.    -Check BMET  - Follow  up 4-5 weeks with Dr Aundra Dubin and an ECHO.  - Discussed plan to repeat ECHO and the importance of avoiding cocaine.  - I discussed the plan with HF SW and HF Paramedicine.   Darrick Grinder, NP-C 03/23/21

## 2021-03-23 NOTE — Progress Notes (Signed)
Medication concerns Chest pain

## 2021-03-23 NOTE — Progress Notes (Signed)
Heart and Vascular Care Navigation  03/23/2021  Matthew [name] 09-Jan-1966 [id number]  Reason for Referral: Concerns with mental health, food insecurity, substance abuse, financial concerns.   Engaged with patient face to face for follow up visit for Heart and Vascular Care Coordination.                                                                                                   Assessment:  CSW met with pt to discuss above concerns.  Pt states he doesn't have food in the house right now.  Has food stamps but does not have transportation to get to the store.  Usually has a friend take him when needed but hasn't organized.  CSW able to arrange ride to grocery store through [employer] following todays appt.  Pt also admits to struggling with depression- still struggling with loss of his wife last year.  CSW assisted in calling [facility]- left message informing of pt needs- they will reach out to set up counseling appt.    Pt also admitting to what sounds like almost daily cocaine use at this time.  CSW and provider discussed with pt- he understands that this is bad for his heart and could hinder recovery.  Pt is agreeable to receiving outpatient counseling.  CSW referred to [facility] who offer sliding scale counseling- pt and CSW to arrange time for him to go to walk in hours to initiate counseling.  Pt also states that he has outstanding electric bills and mortgage bills- thinks he has a court date later this month but can't provide any details on if this if for eviction or just him being in trouble with the home owners association.    Referral made to [facility] for mortgage assistance.  CSW looked up utility bills and pt does not appear to have cut off notices on either- actually appears as bills for last month were paid in full.                  Pt has applied for disability- CSW checked with [facility] who states that his case is in  appeals and he apparently has a lawyer who he needs to call regarding his case- CSW will inform pt.             HRT/VAS Care Coordination     Patients Home Cardiology Office Heart Failure Clinic   Outpatient Care Team [facility] Paramedic Name: Jeris Penta- [phone]   Social Worker Name: Tammy Sours- [facility]- [phone]   Living arrangements for the past 2 months Apartment   Lives with: Minor Children; Self  54yo male and 54yo male (Patient is legal guardian)   Patient Current Insurance Coverage Medicaid Pending   Patient Has Concern With [place] Yes   Patient Concerns With Medical Bills referred to [facility]   Does Patient Have Prescription Coverage? No   Patient Prescription Assistance [place] Devices/Equipment Gilford Rile (specify type)  Social History:                                                                             SDOH Screenings   Alcohol Screen: Low Risk    Last Alcohol Screening Score (AUDIT): 0  Depression (PHQ2-9): Medium Risk   PHQ-2 Score: 16  Financial Resource Strain: High Risk   Difficulty of Paying Living Expenses: Very hard  Food Insecurity: Landscape architect Present   Worried About Charity fundraiser in the Last Year: Sometimes true   Arboriculturist in the Last Year: Sometimes true  Housing: High Risk   Last Housing Risk Score: 2  Physical Activity: Not on file  Social Connections: Not on file  Stress: Stress Concern Present   Feeling of Stress : Very much  Tobacco Use: Medium Risk   Smoking Tobacco Use: Former Smoker   Smokeless Tobacco Use: Former Soil scientist Needs: Public librarian (Medical): Yes   Lack of Transportation (Non-Medical): Yes    SDOH Interventions: Museum/gallery curator Resources:   none- states childrens social security checks come in at the end of this month    Food Insecurity:    inability to get to grocery store but has funds to [employer] Insecurity:   potential court date regarding losing housing- pt to find paperwork  Transportation:   Transportation Interventions: Financial planner    Other Care Navigation Interventions:     Inpatient/Outpatient Substance Abuse Counseling/Rehab Options Referral to [facility] for substance abuse counseling  Patient expressed Mental Health concerns Yes, Referred to:  [facility] for bereavement counseling   Follow-up plan:    Pt to go home and confirm most recent utility bills and see if he has cut off notices.  Pt to find paperwork regarding his mortgage payments and inform CSW what they say.  CSW to help pt follow up on referrals to [facility] and [facility] for counseling.  Will continue to follow and assist as needed  Jorge Ny, [facility] Desk#: [phone] Cell#: [phone]

## 2021-03-24 ENCOUNTER — Ambulatory Visit (INDEPENDENT_AMBULATORY_CARE_PROVIDER_SITE_OTHER): Payer: Self-pay | Admitting: Nurse Practitioner

## 2021-03-24 VITALS — BP 97/63 | HR 68 | Temp 97.9°F | Resp 16 | Ht 70.0 in | Wt 188.0 lb

## 2021-03-24 DIAGNOSIS — H6121 Impacted cerumen, right ear: Secondary | ICD-10-CM | POA: Insufficient documentation

## 2021-03-24 NOTE — Progress Notes (Signed)
@Patient  ID: Matthew [name]., male    DOB: Jul 28, 1966, 55 y.o.   MRN: [id number]  Chief Complaint  Patient presents with  . Follow-up    R Ear feels full. Trouble hearing denies pain in R ear. Started 7 days ago no fever.     Referring provider: Charlott Rakes, MD  HPI  Patient presents today for ear discomfort.  He states that ear pain started We discussed the patient's right ear does have impacted cerumen.  We discussed that the patient can trial Debrox eardrops and also Flonase to help with discomfort. Advised patient that he can get these mediations OTC. Denies f/c/s, n/v/d, hemoptysis, PND, chest pain or edema.    No Known Allergies  Immunization History  Administered Date(s) Administered  . Influenza,inj,Quad PF,6+ Mos 03/03/2014, 12/28/2020  . PFIZER(Purple Top)SARS-COV-2 Vaccination 03/14/2020, 04/05/2020  . Pneumococcal Polysaccharide-23 03/03/2014  . Tdap 12/17/2010    Past Medical History:  Diagnosis Date  . Acute combined systolic and diastolic CHF, NYHA class 3 ([place])    a. 02/2014 Echo: EF 25-30%.  . Cardiomyopathy ([place])    a. 02/2014 Echo: EF 25-30%, sev glob HK with inferolat HK->AK, mod conc LVH, Gr 2 DD, Mild MR, sev dil LA.  . Depression   . DM (diabetes mellitus) ([place])   . High cholesterol   . Hypertension   . Morbid obesity ([place])   . Tobacco abuse     Tobacco History: Social History   Tobacco Use  Smoking Status Former Smoker  . Years: 20.00  . Types: Cigarettes  Smokeless Tobacco Former Systems developer  . Quit date: 11/14/2020   Counseling given: Yes   Outpatient Encounter Medications as of 03/24/2021  Medication Sig  . aspirin EC 81 MG tablet Take 1 tablet (81 mg total) by mouth daily. Swallow whole.  Marland Kitchen atorvastatin (LIPITOR) 80 MG tablet TAKE 1 TABLET BY MOUTH DAILY.  . carvedilol (COREG) 25 MG tablet TAKE 1 TABLET (25 MG TOTAL) BY MOUTH 2 (TWO) TIMES DAILY.  . clonazePAM (KLONOPIN) 0.5 MG tablet TAKE 1 TABLET (0.5 MG TOTAL) BY MOUTH AT BEDTIME AS  NEEDED (SLEEP, ANXIETY).  Marland Kitchen dapagliflozin propanediol (FARXIGA) 10 MG TABS tablet Take 1 tablet (10 mg total) by mouth daily.  Marland Kitchen ezetimibe (ZETIA) 10 MG tablet Take 1 tablet (10 mg total) by mouth daily.  Marland Kitchen FLUoxetine (PROZAC) 40 MG capsule TAKE 1 CAPSULE (40 MG TOTAL) BY MOUTH DAILY.  Marland Kitchen glucose blood (ONE TOUCH ULTRA TEST) test strip Use as instructed  . ibuprofen (ADVIL) 400 MG tablet Take 1 tablet (400 mg total) by mouth every 6 (six) hours as needed for headache or moderate pain.  . isosorbide-hydrALAZINE (BIDIL) 20-37.5 MG tablet TAKE 2 TABLETS BY MOUTH EVERY 8 (EIGHT) HOURS. [place] UNTIL 01/03/22  . sacubitril-valsartan (ENTRESTO) 49-51 MG Take 1 tablet by mouth 2 (two) times daily.  Marland Kitchen spironolactone (ALDACTONE) 25 MG tablet TAKE 1 TABLET BY MOUTH ONCE DAILY.  Marland Kitchen timolol (TIMOPTIC) 0.5 % ophthalmic solution Place 1 drop into both eyes daily after breakfast.  . [DISCONTINUED] lovastatin (MEVACOR) 20 MG tablet Take 2 tablets (40 mg total) by mouth at bedtime.   No facility-administered encounter medications on file as of 03/24/2021.     Review of Systems  Review of Systems  Constitutional: Negative.  Negative for fatigue and fever.  HENT: Positive for ear pain (right) and hearing loss.   Respiratory: Negative for cough and shortness of breath.   Cardiovascular: Negative.  Negative  for chest pain, palpitations and leg swelling.  Gastrointestinal: Negative.   Allergic/Immunologic: Negative.   Neurological: Negative.  Negative for weakness.  Psychiatric/Behavioral: Negative.        Physical Exam  BP 97/63   Pulse 68   Temp 97.9 F (36.6 C) (Temporal)   Resp 16   Ht 5\' 10"  (1.778 m)   Wt 188 lb (85.3 kg)   SpO2 99%   BMI 26.98 kg/m   Wt Readings from Last 5 Encounters:  03/24/21 188 lb (85.3 kg)  03/23/21 191 lb (86.6 kg)  03/23/21 192 lb (87.1 kg)  03/07/21 192 lb (87.1 kg)  02/08/21 193 lb 6.4 oz (87.7 kg)     Physical  Exam Vitals and nursing note reviewed.  Constitutional:      General: He is not in acute distress.    Appearance: He is well-developed.  Cardiovascular:     Rate and Rhythm: Normal rate and regular rhythm.  Pulmonary:     Effort: Pulmonary effort is normal.     Breath sounds: Normal breath sounds.  Musculoskeletal:     Right lower leg: No edema.     Left lower leg: No edema.  Skin:    General: Skin is warm and dry.  Neurological:     Mental Status: He is alert and oriented to person, place, and time.  Psychiatric:        Mood and Affect: Mood normal.        Behavior: Behavior normal.        Assessment & Plan:   Impacted cerumen of right ear Right ear discomfort:  May try debrox ear drops OTC  Will order Flonase    Follow up:  Follow up with PCP in 1 week       Fenton Foy, NP 03/24/2021

## 2021-03-24 NOTE — Patient Instructions (Addendum)
Right ear discomfort:  May try debrox ear drops OTC  Will order Flonase    Follow up:  Follow up with PCP in 1 week     Earwax Buildup, Adult The ears produce a substance called earwax that helps keep bacteria out of the ear and protects the skin in the ear canal. Occasionally, earwax can build up in the ear and cause discomfort or hearing loss. What are the causes? This condition is caused by a buildup of earwax. Ear canals are self-cleaning. Ear wax is made in the outer part of the ear canal and generally falls out in small amounts over time. When the self-cleaning mechanism is not working, earwax builds up and can cause decreased hearing and discomfort. Attempting to clean ears with cotton swabs can push the earwax deep into the ear canal and cause decreased hearing and pain. What increases the risk? This condition is more likely to develop in people who:  Clean their ears often with cotton swabs.  Pick at their ears.  Use earplugs or in-ear headphones often, or wear hearing aids. The following factors may also make you more likely to develop this condition:  Being male.  Being of older age.  Naturally producing more earwax.  Having narrow ear canals.  Having earwax that is overly thick or sticky.  Having excess hair in the ear canal.  Having eczema.  Being dehydrated. What are the signs or symptoms? Symptoms of this condition include:  Reduced or muffled hearing.  A feeling of fullness in the ear or feeling that the ear is plugged.  Fluid coming from the ear.  Ear pain or an itchy ear.  Ringing in the ear.  Coughing.  Balance problems.  An obvious piece of earwax that can be seen inside the ear canal. How is this diagnosed? This condition may be diagnosed based on:  Your symptoms.  Your medical history.  An ear exam. During the exam, your health care provider will look into your ear with an instrument called an otoscope. You may have tests,  including a hearing test. How is this treated? This condition may be treated by:  Using ear drops to soften the earwax.  Having the earwax removed by a health care provider. The health care provider may: ? Flush the ear with water. ? Use an instrument that has a loop on the end (curette). ? Use a suction device.  Having surgery to remove the wax buildup. This may be done in severe cases. Follow these instructions at home:  Take over-the-counter and prescription medicines only as told by your health care provider.  Do not put any objects, including cotton swabs, into your ear. You can clean the opening of your ear canal with a washcloth or facial tissue.  Follow instructions from your health care provider about cleaning your ears. Do not overclean your ears.  Drink enough fluid to keep your urine pale yellow. This will help to thin the earwax.  Keep all follow-up visits as told. If earwax builds up in your ears often or if you use hearing aids, consider seeing your health care provider for routine, preventive ear cleanings. Ask your health care provider how often you should schedule your cleanings.  If you have hearing aids, clean them according to instructions from the manufacturer and your health care provider.   Contact a health care provider if:  You have ear pain.  You develop a fever.  You have pus or other fluid coming from your ear.  You have hearing loss.  You have ringing in your ears that does not go away.  You feel like the room is spinning (vertigo).  Your symptoms do not improve with treatment. Get help right away if:  You have bleeding from the affected ear.  You have severe ear pain. Summary  Earwax can build up in the ear and cause discomfort or hearing loss.  The most common symptoms of this condition include reduced or muffled hearing, a feeling of fullness in the ear, or feeling that the ear is plugged.  This condition may be diagnosed based on  your symptoms, your medical history, and an ear exam.  This condition may be treated by using ear drops to soften the earwax or by having the earwax removed by a health care provider.  Do not put any objects, including cotton swabs, into your ear. You can clean the opening of your ear canal with a washcloth or facial tissue. This information is not intended to replace advice given to you by your health care provider. Make sure you discuss any questions you have with your health care provider. Document Revised: 03/22/2020 Document Reviewed: 03/22/2020 Elsevier Patient Education  [place].

## 2021-03-24 NOTE — Assessment & Plan Note (Signed)
Right ear discomfort:  May try debrox ear drops OTC  Will order Flonase    Follow up:  Follow up with PCP in 1 week

## 2021-03-27 ENCOUNTER — Other Ambulatory Visit: Payer: Self-pay

## 2021-03-28 ENCOUNTER — Telehealth (HOSPITAL_COMMUNITY): Payer: Self-pay | Admitting: Licensed Clinical Social Worker

## 2021-03-28 NOTE — Progress Notes (Signed)
Heart and Vascular Care Navigation  03/28/2021  Matthew [name] [birth year] [id number]  Reason for Referral: follow up regarding financial concerns and mental health/substance abuse concerns   Engaged with patient by telephone for follow up visit for Heart and Vascular Care Coordination.                                                                                                   Assessment:  CSW spoke with pt over the phone regarding several concerns.  Pt first reported concern is that he does not have money to renew his phone minutes for the next month and requesting help.  CSW able to pay for 30 more days on pt tracfone using [employer]- informed pt this would be the last time we are able to assist with this as we have now paid for 3 months of service.  Pt expressed understanding and still maintains he will get the first check from his childrens social security later this month.  Pt then inquired if pt had heard regarding mortgage assistance through [facility]- pt states he hasn't heard from them at this time.  CSW provided pt their phone number to follow up directly.  Pt reports he is still unsure what is going on with his mortgage and if he is being evicted or what the upcoming court date is about- plans to look into further today.  CSW inquired about mental health referrals made last week.  States he did hear from [facility] regarding bereavement counseling- has phone appt on Thursday.  CSW asked about getting pt over to [place] to follow up on substance abuse and mental health counseling- he does not think he has time to go to walk in hours this week- agreeable to trying again next week.   CSW had informed pt last week that he apparently had lawyer helping with his SSA disability application and had provided with contact information to reach out and find who is representing him- he has not done this yet- will plan to do so today.                                      HRT/VAS Care Coordination    Patients Home Cardiology Office Heart Failure Clinic   Outpatient Care Team [facility] Paramedic Name: Jeris Penta- [phone]   Social Worker Name: Tammy Sours- [facility]- [phone]   Living arrangements for the past 2 months Apartment   Lives with: Minor Children; Self  54yo male and 54yo male (Patient is legal guardian)   Patient Current Insurance Coverage Medicaid Pending   Patient Has Concern With [place] Yes   Patient Concerns With Medical Bills referred to [facility]   Does Patient Have Prescription Coverage? No   Patient Prescription Assistance Programs Heart Failure Fund   Home Assistive Devices/Equipment Walker (specify type)      Social History:  SDOH Screenings   Alcohol Screen: Low Risk   . Last Alcohol Screening Score (AUDIT): 0  Depression (PHQ2-9): Medium Risk  . PHQ-2 Score: 16  Financial Resource Strain: High Risk  . Difficulty of Paying Living Expenses: Very hard  Food Insecurity: Food Insecurity Present  . Worried About Charity fundraiser in the Last Year: Sometimes true  . Ran Out of Food in the Last Year: Sometimes true  Housing: High Risk  . Last Housing Risk Score: 2  Physical Activity: Not on file  Social Connections: Not on file  Stress: Stress Concern Present  . Feeling of Stress : Very much  Tobacco Use: Medium Risk  . Smoking Tobacco Use: Former Smoker  . Smokeless Tobacco Use: Former Soil scientist Needs: Higher education careers adviser  . Lack of Transportation (Medical): Yes  . Lack of Transportation (Non-Medical): Yes    SDOH Interventions: Financial Resources:  Financial Strain Interventions: Other (Comment) (able to pay for tracfone bill for one more month as pt states he can't afford)   Food Insecurity:   gets $250 food stamps- no concerns  at this time  Housing Insecurity:    pt trying to find out what is happening with HOA and its potential risks to his housing- referred for mortgage assistance  Transportation:    CSW assists with [employer] when needed     Follow-up plan:    Pt to call [employer] re mortgage assistance and to update CSW regarding what is actually going on with housing and if he is at risk of losing housing at this time.  Pt to call and find out who is representing him with disability case- also reports he has phone appt with SSA this week.  CSW to contact pt next week regarding going to [place] to follow up regarding substance abuse concerns.  Will continue to follow and assist as needed  Jorge Ny, [facility] Desk#: [phone] Cell#: [phone]

## 2021-03-30 ENCOUNTER — Telehealth (HOSPITAL_COMMUNITY): Payer: Self-pay

## 2021-03-30 NOTE — Telephone Encounter (Signed)
Left message for [name] regarding our home visit scheduled for today. No answer. I will reach out next week.

## 2021-04-03 ENCOUNTER — Other Ambulatory Visit: Payer: Self-pay

## 2021-04-05 ENCOUNTER — Telehealth (HOSPITAL_COMMUNITY): Payer: Self-pay | Admitting: Licensed Clinical Social Worker

## 2021-04-05 NOTE — Telephone Encounter (Signed)
CSW helped pt with transportation to [place] to complete counseling intake for substance abuse and mental health counseling services.  Will then help pt get to grocery store as he is almost out of food.  Will continue to follow and assist as needed  Jorge Ny, [facility] Desk#: [phone] Cell#: [phone]

## 2021-04-06 ENCOUNTER — Telehealth (HOSPITAL_COMMUNITY): Payer: Self-pay | Admitting: Licensed Clinical Social Worker

## 2021-04-06 NOTE — Telephone Encounter (Signed)
Pt was able to get mental health/substance abuse counseling appt today at 1pm- CSW set up [facility] to take to appt.  CSW provided paramedic with SCAT application for pt to fill out to receive assistance with future transport to non [facility] facilities and grocery store.  Will continue to follow and assist as needed  Jorge Ny, [facility] Desk#: [phone] Cell#: [phone]

## 2021-04-07 ENCOUNTER — Telehealth (HOSPITAL_COMMUNITY): Payer: Self-pay

## 2021-04-07 NOTE — Telephone Encounter (Signed)
Spoke to Matthew [name] who agreed on a Monday home visit at 3:00. Call complete.

## 2021-04-10 ENCOUNTER — Other Ambulatory Visit (HOSPITAL_COMMUNITY): Payer: Self-pay

## 2021-04-10 NOTE — Progress Notes (Signed)
Paramedicine Encounter    Patient ID: Matthew Heman., male    DOB: 12/03/1966, 55 y.o.   MRN: [id number]   Patient Care Team: Matthew Rakes, MD as PCP - General (Family Medicine) Matthew Booze, MD as Consulting Physician (Cardiology) Matthew Booze, MD as Consulting Physician (Cardiology) Matthew Ny, LCSW as Social Worker (Licensed Clinical Social Worker)  Patient Active Problem List   Diagnosis Date Noted  . Impacted cerumen of right ear 03/24/2021  . MDD (major depressive disorder), recurrent severe, without psychosis ([place]) 01/03/2021  . Acute exacerbation of congestive heart failure ([place]) 12/24/2020  . Acute respiratory failure with hypoxia ([place]) 02/15/2020  . Acute on chronic combined systolic and diastolic CHF (congestive heart failure) ([place]) 02/15/2020  . Hypertensive urgency 02/15/2020  . CHF (congestive heart failure) ([place]) 02/15/2020  . 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 Expressive aphasia 02/20/2017  . Mixed hyperlipidemia   . History of stroke 01/31/2017  . Erectile dysfunction 07/30/2016  . OSA (obstructive sleep apnea) 05/10/2015  . CKD (chronic kidney disease) stage 2, GFR 60-89 ml/min 03/10/2014  . Diabetes mellitus type II, controlled ([place]) 03/10/2014  . Chronic combined systolic and diastolic congestive heart failure ([place]) 03/03/2014  . Smoker   . Morbid obesity ([place])   . Hypertension 03/02/2014    Current Outpatient Medications:  .  aspirin EC 81 MG tablet, Take 1 tablet (81 mg total) by mouth daily. Swallow whole., Disp: 30 tablet, Rfl: 11 .  atorvastatin (LIPITOR) 80 MG tablet, TAKE 1 TABLET BY MOUTH DAILY., Disp: 7 tablet, Rfl: 0 .  carvedilol (COREG) 25 MG tablet, TAKE 1 TABLET (25 MG TOTAL) BY MOUTH 2 (TWO) TIMES DAILY., Disp: 60 tablet, Rfl: 3 .  clonazePAM (KLONOPIN) 0.5 MG tablet, TAKE 1 TABLET (0.5 MG TOTAL) BY MOUTH AT BEDTIME AS NEEDED (SLEEP, ANXIETY)., Disp: 30 tablet, Rfl: 0 .  dapagliflozin propanediol  (FARXIGA) 10 MG TABS tablet, Take 1 tablet (10 mg total) by mouth daily., Disp: 90 tablet, Rfl: 3 .  ezetimibe (ZETIA) 10 MG tablet, Take 1 tablet (10 mg total) by mouth daily., Disp: 90 tablet, Rfl: 3 .  FLUoxetine (PROZAC) 40 MG capsule, TAKE 1 CAPSULE (40 MG TOTAL) BY MOUTH DAILY., Disp: 30 capsule, Rfl: 6 .  glucose blood (ONE TOUCH ULTRA TEST) test strip, Use as instructed, Disp: 30 each, Rfl: 12 .  ibuprofen (ADVIL) 400 MG tablet, Take 1 tablet (400 mg total) by mouth every 6 (six) hours as needed for headache or moderate pain., Disp: 30 tablet, Rfl: 0 .  isosorbide-hydrALAZINE (BIDIL) 20-37.5 MG tablet, TAKE 2 TABLETS BY MOUTH EVERY 8 (EIGHT) HOURS. [place] UNTIL 01/03/22, Disp: 90 tablet, Rfl: 1 .  sacubitril-valsartan (ENTRESTO) 49-51 MG, Take 1 tablet by mouth 2 (two) times daily., Disp: 60 tablet, Rfl: 1 .  spironolactone (ALDACTONE) 25 MG tablet, TAKE 1 TABLET BY MOUTH ONCE DAILY., Disp: 34 tablet, Rfl: 1 .  timolol (TIMOPTIC) 0.5 % ophthalmic solution, Place 1 drop into both eyes daily after breakfast., Disp: , Rfl:  No Known Allergies   Social History   Socioeconomic History  . Marital status: Widowed    Spouse name: Not on file  . Number of children: 2  . Years of education: 19  . Highest education level: Not on file  Occupational History  . Occupation: [employer] Services  Tobacco Use  . Smoking status: Former Smoker    Years: 20.00    Types: Cigarettes  .  Smokeless tobacco: Former Systems developer    Quit date: 11/14/2020  Vaping Use  . Vaping Use: Never used  Substance and Sexual Activity  . Alcohol use: No  . Drug use: No  . Sexual activity: Yes  Other Topics Concern  . Not on file  Social History Narrative   Lives with 2 young children in [place].  Wife died in 04-21-20.  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   Left-handed   Caffeine: occasional tea   Social Determinants of Health   Financial Resource Strain:  High Risk  . Difficulty of Paying Living Expenses: Very hard  Food Insecurity: Food Insecurity Present  . Worried About Charity fundraiser in the Last Year: Sometimes true  . Ran Out of Food in the Last Year: Sometimes true  Transportation Needs: Unmet Transportation Needs  . Lack of Transportation (Medical): Yes  . Lack of Transportation (Non-Medical): Yes  Physical Activity: Not on file  Stress: Stress Concern Present  . Feeling of Stress : Very much  Social Connections: Not on file  Intimate Partner Violence: Not on file    Physical Exam Vitals reviewed.  Constitutional:      Appearance: Normal appearance. He is normal weight.  HENT:     Head: Normocephalic.     Nose: Nose normal.     Mouth/Throat:     Mouth: Mucous membranes are moist.     Pharynx: Oropharynx is clear.  Eyes:     Conjunctiva/sclera: Conjunctivae normal.     Pupils: Pupils are equal, round, and reactive to light.  Cardiovascular:     Rate and Rhythm: Normal rate and regular rhythm.     Pulses: Normal pulses.     Heart sounds: Normal heart sounds.  Pulmonary:     Effort: Pulmonary effort is normal.     Breath sounds: Normal breath sounds.  Abdominal:     General: Abdomen is flat.     Palpations: Abdomen is soft.  Musculoskeletal:        General: Normal range of motion.     Cervical back: Normal range of motion.     Right lower leg: No edema.     Left lower leg: No edema.  Skin:    General: Skin is warm and dry.     Capillary Refill: Capillary refill takes less than 2 seconds.  Neurological:     General: No focal deficit present.     Mental Status: He is alert. Mental status is at baseline.  Psychiatric:        Mood and Affect: Mood normal.      Arrived for home visit for Matthew [name] where he was alert and oriented reporting to be feeling tired today with some lower back pain. Matthew [name] denied any recent injury, fall or trauma. Matthew [name] denied any urinary symptoms as well. I obtained vitals as noted. I  reviewed medications and filled one pillbox for Matthew [name]. Matthew [name] was given written upcoming appointment list. Matthew [name] agreed with same.   I assisted Matthew [name] in filling out SCAT application and will return same to [employer]   Home visit complete. I will see Matthew [name] in one week.   Refills: Prozac      Future Appointments  Date Time Provider [place]  04/24/2021 11:00 AM MC ECHO OP 1 [facility]  04/24/2021 12:00 PM Larey Dresser, MD MC-HVSC None  05/10/2021 10:30 AM Matthew Rakes, MD CHW-CHWW None  06/21/2021  3:30 PM Matthew Rakes, MD  CHW-CHWW None     ACTION: Home visit completed Next visit planned for one week

## 2021-04-14 ENCOUNTER — Telehealth (HOSPITAL_COMMUNITY): Payer: Self-pay | Admitting: Licensed Clinical Social Worker

## 2021-04-14 NOTE — Telephone Encounter (Signed)
SCAT application submitted to try and help pt with transportation barriers to non [facility] facilities.    CSW will continue to follow and assist as needed  Jorge Ny, [facility] Desk#: [phone] Cell#: [phone]

## 2021-04-17 ENCOUNTER — Telehealth (HOSPITAL_COMMUNITY): Payer: Self-pay

## 2021-04-17 NOTE — Telephone Encounter (Signed)
Attempted to reach Matthew [name] for home visit via telephone and no answer I will re-attempt tomorrow.

## 2021-04-24 ENCOUNTER — Other Ambulatory Visit: Payer: Self-pay

## 2021-04-24 ENCOUNTER — Encounter (HOSPITAL_COMMUNITY): Payer: Medicaid Other | Admitting: Cardiology

## 2021-04-24 ENCOUNTER — Ambulatory Visit (HOSPITAL_COMMUNITY)
Admission: RE | Admit: 2021-04-24 | Discharge: 2021-04-24 | Disposition: A | Discharge status: Home / Self Care | Payer: Self-pay | Source: Ambulatory Visit | Attending: Adult Health | Admitting: Adult Health

## 2021-04-24 ENCOUNTER — Other Ambulatory Visit (HOSPITAL_COMMUNITY): Payer: Self-pay

## 2021-04-24 ENCOUNTER — Telehealth (HOSPITAL_COMMUNITY): Payer: Self-pay

## 2021-04-24 DIAGNOSIS — F17200 Nicotine dependence, unspecified, uncomplicated: Secondary | ICD-10-CM | POA: Insufficient documentation

## 2021-04-24 DIAGNOSIS — E119 Type 2 diabetes mellitus without complications: Secondary | ICD-10-CM | POA: Insufficient documentation

## 2021-04-24 DIAGNOSIS — I5042 Chronic combined systolic (congestive) and diastolic (congestive) heart failure: Secondary | ICD-10-CM | POA: Insufficient documentation

## 2021-04-24 DIAGNOSIS — I428 Other cardiomyopathies: Secondary | ICD-10-CM | POA: Insufficient documentation

## 2021-04-24 DIAGNOSIS — I110 Hypertensive heart disease with heart failure: Secondary | ICD-10-CM | POA: Insufficient documentation

## 2021-04-24 LAB — ECHOCARDIOGRAM COMPLETE
Area-P 1/2: 1.31 cm2
Calc EF: 59.7 %
S' Lateral: 3.9 cm
Single Plane A2C EF: 59.6 %
Single Plane A4C EF: 58 %

## 2021-04-24 NOTE — Telephone Encounter (Signed)
Heart Failure Nurse Navigator Progress Note  Pt called this AM stating has appt today for ECHO then follow up with AHF clinic. Pt stated he had missed last paramedicine visit so his medication organizer/pill box has not been checked. Told Navigator he attempted to call SW.   Navigator talked with patient about his weekend (being Mother's Day) as this is a hard holiday for him. Stated he got to see his kids, which was nice. Stated he did not sleep well--long emotional day.   Asked pt to bring in all medications and pill box to reorganize with him. Navigator spoke with [place] regarding phone call--was just to update her about medications/paramedicine and his appt today.   Pt called back when he arrived to clinic this AM for his scheduled ECHO at [place] to visit socially with patient.   Pricilla Holm, RN, BSN Heart Failure Nurse Navigator [phone]

## 2021-04-24 NOTE — Progress Notes (Signed)
Paramedicine Encounter    Patient ID: Matthew [name]., male    DOB: [birth year], 55 y.o.   MRN: [id number]   Patient Care Team: Charlott Rakes, MD as PCP - General (Family Medicine) Jettie Booze, MD as Consulting Physician (Cardiology) Jettie Booze, MD as Consulting Physician (Cardiology) Jorge Ny, LCSW as Social Worker (Licensed Clinical Social Worker)  Patient Active Problem List   Diagnosis Date Noted  . Impacted cerumen of right ear 03/24/2021  . MDD (major depressive disorder), recurrent severe, without psychosis ([place]) 01/03/2021  . Acute exacerbation of congestive heart failure ([place]) 12/24/2020  . Acute respiratory failure with hypoxia ([place]) 02/15/2020  . Acute on chronic combined systolic and diastolic CHF (congestive heart failure) ([place]) 02/15/2020  . Hypertensive urgency 02/15/2020  . CHF (congestive heart failure) ([place]) 02/15/2020  . 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 Expressive aphasia 02/20/2017  . Mixed hyperlipidemia   . History of stroke 01/31/2017  . Erectile dysfunction 07/30/2016  . OSA (obstructive sleep apnea) 05/10/2015  . CKD (chronic kidney disease) stage 2, GFR 60-89 ml/min 03/10/2014  . Diabetes mellitus type II, controlled ([place]) 03/10/2014  . Chronic combined systolic and diastolic congestive heart failure ([place]) 03/03/2014  . Smoker   . Morbid obesity ([place])   . Hypertension 03/02/2014    Current Outpatient Medications:  .  aspirin EC 81 MG tablet, Take 1 tablet (81 mg total) by mouth daily. Swallow whole., Disp: 30 tablet, Rfl: 11 .  atorvastatin (LIPITOR) 80 MG tablet, TAKE 1 TABLET BY MOUTH DAILY., Disp: 7 tablet, Rfl: 0 .  carvedilol (COREG) 25 MG tablet, TAKE 1 TABLET (25 MG TOTAL) BY MOUTH 2 (TWO) TIMES DAILY., Disp: 60 tablet, Rfl: 3 .  clonazePAM (KLONOPIN) 0.5 MG tablet, TAKE 1 TABLET (0.5 MG TOTAL) BY MOUTH AT BEDTIME AS NEEDED (SLEEP, ANXIETY). (Patient not taking: Reported on 04/10/2021), Disp: 30 tablet,  Rfl: 0 .  dapagliflozin propanediol (FARXIGA) 10 MG TABS tablet, Take 1 tablet (10 mg total) by mouth daily., Disp: 90 tablet, Rfl: 3 .  ezetimibe (ZETIA) 10 MG tablet, Take 1 tablet (10 mg total) by mouth daily., Disp: 90 tablet, Rfl: 3 .  FLUoxetine (PROZAC) 40 MG capsule, TAKE 1 CAPSULE (40 MG TOTAL) BY MOUTH DAILY., Disp: 30 capsule, Rfl: 6 .  glucose blood (ONE TOUCH ULTRA TEST) test strip, Use as instructed, Disp: 30 each, Rfl: 12 .  ibuprofen (ADVIL) 400 MG tablet, Take 1 tablet (400 mg total) by mouth every 6 (six) hours as needed for headache or moderate pain., Disp: 30 tablet, Rfl: 0 .  isosorbide-hydrALAZINE (BIDIL) 20-37.5 MG tablet, TAKE 2 TABLETS BY MOUTH EVERY 8 (EIGHT) HOURS. [place] UNTIL 01/03/22, Disp: 90 tablet, Rfl: 1 .  sacubitril-valsartan (ENTRESTO) 49-51 MG, Take 1 tablet by mouth 2 (two) times daily., Disp: 60 tablet, Rfl: 1 .  spironolactone (ALDACTONE) 25 MG tablet, TAKE 1 TABLET BY MOUTH ONCE DAILY., Disp: 34 tablet, Rfl: 1 .  timolol (TIMOPTIC) 0.5 % ophthalmic solution, Place 1 drop into both eyes daily after breakfast., Disp: , Rfl:  No Known Allergies   Social History   Socioeconomic History  . Marital status: Widowed    Spouse name: Not on file  . Number of children: 2  . Years of education: 67  . Highest education level: Not on file  Occupational History  . Occupation: [employer] Services  Tobacco Use  . Smoking status: Former Smoker    Years: 20.00  Types: Cigarettes  . Smokeless tobacco: Former Systems developer    Quit date: 11/14/2020  Vaping Use  . Vaping Use: Never used  Substance and Sexual Activity  . Alcohol use: No  . Drug use: No  . Sexual activity: Yes  Other Topics Concern  . Not on file  Social History Narrative   Lives with 2 young children in [place].  Wife died in 05/22/20.  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   Left-handed   Caffeine: occasional tea   Social Determinants of  Health   Financial Resource Strain: High Risk  . Difficulty of Paying Living Expenses: Very hard  Food Insecurity: Food Insecurity Present  . Worried About Charity fundraiser in the Last Year: Sometimes true  . Ran Out of Food in the Last Year: Sometimes true  Transportation Needs: Unmet Transportation Needs  . Lack of Transportation (Medical): Yes  . Lack of Transportation (Non-Medical): Yes  Physical Activity: Not on file  Stress: Stress Concern Present  . Feeling of Stress : Very much  Social Connections: Not on file  Intimate Partner Violence: Not on file    Physical Exam   Met with Matthew [name] in the clinic today following his ECHO appointment. I reviewed medications with Matthew [name] and filled his pill box accordingly. Matthew [name] reports he has been taking his medications. We were unable to meet last week due to Matthew [name] not answering when I called. I advised him the importance of answering phone calls for scheduling appointments/home visits. He verbalized understanding. Matthew [name], Nurse Navigator came in to talk to Matthew [name] as he was feeling down about the weekend and it being a holiday in which he has a hard time with after this wife passing away. I plan to see Matthew [name] in clinic on Thursday when he sees Dr. Aundra Dubin. [name] agreed. Visit complete.   Refills:  Prozac  Matthew [name]    Future Appointments  Date Time Provider [place]  04/27/2021  1:20 PM Larey Dresser, MD MC-[facility] None  05/10/2021 10:30 AM Charlott Rakes, MD [facility]W-[facility] None  06/21/2021  3:30 PM Charlott Rakes, MD CHW-[facility] None     ACTION: Home visit completed Next visit planned for Thursday

## 2021-04-24 NOTE — Progress Notes (Signed)
  Echocardiogram 2D Echocardiogram has been performed.  Matthew [name] 04/24/2021, 11:45 AM

## 2021-04-25 ENCOUNTER — Other Ambulatory Visit: Payer: Self-pay

## 2021-04-25 MED FILL — Spironolactone Tab 25 MG: ORAL | 34 days supply | Qty: 34 | Fill #0 | Status: AC

## 2021-04-26 ENCOUNTER — Other Ambulatory Visit: Payer: Self-pay

## 2021-04-27 ENCOUNTER — Other Ambulatory Visit: Payer: Self-pay

## 2021-04-27 ENCOUNTER — Encounter (HOSPITAL_COMMUNITY): Payer: Self-pay | Admitting: Cardiology

## 2021-04-27 ENCOUNTER — Ambulatory Visit (HOSPITAL_COMMUNITY)
Admission: RE | Admit: 2021-04-27 | Discharge: 2021-04-27 | Disposition: A | Discharge status: Home / Self Care | Payer: Self-pay | Source: Ambulatory Visit | Attending: Cardiology | Admitting: Cardiology

## 2021-04-27 ENCOUNTER — Other Ambulatory Visit (HOSPITAL_COMMUNITY): Payer: Self-pay

## 2021-04-27 VITALS — BP 110/82 | HR 65 | Wt 187.0 lb

## 2021-04-27 DIAGNOSIS — G4733 Obstructive sleep apnea (adult) (pediatric): Secondary | ICD-10-CM | POA: Insufficient documentation

## 2021-04-27 DIAGNOSIS — Z8249 Family history of ischemic heart disease and other diseases of the circulatory system: Secondary | ICD-10-CM | POA: Insufficient documentation

## 2021-04-27 DIAGNOSIS — Z79899 Other long term (current) drug therapy: Secondary | ICD-10-CM | POA: Insufficient documentation

## 2021-04-27 DIAGNOSIS — Z7984 Long term (current) use of oral hypoglycemic drugs: Secondary | ICD-10-CM | POA: Insufficient documentation

## 2021-04-27 DIAGNOSIS — N1831 Chronic kidney disease, stage 3a: Secondary | ICD-10-CM | POA: Insufficient documentation

## 2021-04-27 DIAGNOSIS — E1122 Type 2 diabetes mellitus with diabetic chronic kidney disease: Secondary | ICD-10-CM | POA: Insufficient documentation

## 2021-04-27 DIAGNOSIS — F1410 Cocaine abuse, uncomplicated: Secondary | ICD-10-CM | POA: Insufficient documentation

## 2021-04-27 DIAGNOSIS — Z87891 Personal history of nicotine dependence: Secondary | ICD-10-CM | POA: Insufficient documentation

## 2021-04-27 DIAGNOSIS — Z7982 Long term (current) use of aspirin: Secondary | ICD-10-CM | POA: Insufficient documentation

## 2021-04-27 DIAGNOSIS — Z8673 Personal history of transient ischemic attack (TIA), and cerebral infarction without residual deficits: Secondary | ICD-10-CM | POA: Insufficient documentation

## 2021-04-27 DIAGNOSIS — I5040 Unspecified combined systolic (congestive) and diastolic (congestive) heart failure: Secondary | ICD-10-CM | POA: Insufficient documentation

## 2021-04-27 DIAGNOSIS — E785 Hyperlipidemia, unspecified: Secondary | ICD-10-CM | POA: Insufficient documentation

## 2021-04-27 DIAGNOSIS — F32A Depression, unspecified: Secondary | ICD-10-CM | POA: Insufficient documentation

## 2021-04-27 DIAGNOSIS — I428 Other cardiomyopathies: Secondary | ICD-10-CM | POA: Insufficient documentation

## 2021-04-27 DIAGNOSIS — I130 Hypertensive heart and chronic kidney disease with heart failure and stage 1 through stage 4 chronic kidney disease, or unspecified chronic kidney disease: Secondary | ICD-10-CM | POA: Insufficient documentation

## 2021-04-27 DIAGNOSIS — I5042 Chronic combined systolic (congestive) and diastolic (congestive) heart failure: Secondary | ICD-10-CM

## 2021-04-27 LAB — BASIC METABOLIC PANEL
Anion gap: 5 (ref 5–15)
BUN: 21 mg/dL — ABNORMAL HIGH (ref 6–20)
CO2: 24 mmol/L (ref 22–32)
Calcium: 8.7 mg/dL — ABNORMAL LOW (ref 8.9–10.3)
Chloride: 108 mmol/L (ref 98–111)
Creatinine, Ser: 1.61 mg/dL — ABNORMAL HIGH (ref 0.61–1.24)
GFR, Estimated: 51 mL/min — ABNORMAL LOW (ref >60)
Glucose, Bld: 102 mg/dL — ABNORMAL HIGH (ref 70–99)
Potassium: 3.9 mmol/L (ref 3.5–5.1)
Sodium: 137 mmol/L (ref 135–145)

## 2021-04-27 LAB — BRAIN NATRIURETIC PEPTIDE: B Natriuretic Peptide: 20.6 pg/mL (ref 0.0–100.0)

## 2021-04-27 MED ORDER — ISOSORB DINITRATE-HYDRALAZINE 20-37.5 MG PO TABS
1.0000 | ORAL_TABLET | Freq: Three times a day (TID) | ORAL | 11 refills | Status: DC
  Filled 2021-04-27: qty 90, 30d supply, fill #0

## 2021-04-27 MED ORDER — FUROSEMIDE 20 MG PO TABS
20.0000 mg | ORAL_TABLET | Freq: Every day | ORAL | 11 refills | Status: DC
  Filled 2021-04-27 – 2021-06-14 (×3): qty 30, 30d supply, fill #0
  Filled 2021-08-18: qty 30, 30d supply, fill #1

## 2021-04-27 NOTE — Patient Instructions (Addendum)
Labs done today. We will contact you only if your labs are abnormal.  START Lasix 20mg  (1 tablet) by mouth daily.  DECREASE Bidil to 1 tablet by mouth 3 times daily.  No other medication changes were made. Please continue all current medications as prescribed.  Your physician has recommended that you have a pulmonary function test. Pulmonary Function Tests are a group of tests that measure how well air moves in and out of your lungs. This has to be approved through your insurance company prior to scheduling, once approved we will contact you to schedule an appointment.  Your physician recommends that you schedule a follow-up appointment in: 10 days for a lab only appointment and in 1 month with our APP Clinic here in our office.  If you have any questions or concerns before your next appointment please send [place] a message through [place] or call our office at [phone].    TO LEAVE A MESSAGE FOR THE NURSE SELECT OPTION 2, PLEASE LEAVE A MESSAGE INCLUDING: . YOUR NAME . DATE OF BIRTH . CALL BACK NUMBER . REASON FOR CALL**this is important as we prioritize the call backs  YOU WILL RECEIVE A CALL BACK THE SAME DAY AS LONG AS YOU CALL BEFORE 4:00 PM   Do the following things EVERYDAY: 1) Weigh yourself in the morning before breakfast. Write it down and keep it in a log. 2) Take your medicines as prescribed 3) Eat low salt foods--Limit salt (sodium) to 2000 mg per day.  4) Stay as active as you can everyday 5) Limit all fluids for the day to less than 2 liters   At the [facility], you and your health needs are our priority. As part of our continuing mission to provide you with exceptional heart care, we have created designated Provider Care Teams. These Care Teams include your primary Cardiologist (physician) and Advanced Practice Providers (APPs- Physician Assistants and Nurse Practitioners) who all work together to provide you with the care you need, when you need it.    You may see any of the following providers on your designated Care Team at your next follow up: Marland Kitchen Dr Glori Bickers . Dr Loralie Champagne . Darrick Grinder, NP . Lyda Jester, PA . Audry Riles, PharmD   Please be sure to bring in all your medications bottles to every appointment.

## 2021-04-27 NOTE — Progress Notes (Signed)
Advanced Heart Failure Clinic Note    PCP: Charlott Rakes, MD HF Cardiologist: Dr. Aundra Dubin  HPI: Matthew Petersonis a 55 y.o. with PMH significant for combined systolic CHF with biventricular failure, T2DM, HTN, OSA, tobacco use, HLD, cocaine use, poor medication adherence, depression,  CVA, and CKD 3a.     He was first admitted with heart failure symptoms in 2015 in the setting of long standing uncontrolled hypertension and smoking.  Had a LHC with normal cors.   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  Was not followed by cardiology or hospitalized for CHF during this time period.    March 2021 he began having CHF exacerbations after running out of meds between admissions.  Left AMA in March. Another similar admission in July, he was out of his medications at that time as well.    Most recently he was admitted 12/2020 with CHF exacerbation after running out of his medications.  He was diuresed, put on an excellent medication regimen. He also endorsed suicidal ideation at that time and was subsequently admitted to behavioral health. UDS was positive for cocaine, echo showed EF < 20% with moderate RV dysfunction.   He was seen in the HF clinic 02/08/21 and lasix was cut back to 20 mg daily, later Lasix was stopped.   Echo was done in 5/22 with EF improved to 50%.   He returns today for followup of CHF.  Weight was down 4 lbs. Main complaint is worsening of exertional dyspnea.  He tries to walk about 1/2 mile/day but gets short of breath on his walk.  Dyspnea seems to wax and wane, worse on some days than others.  He still has episodes of lightheadedness with standing, this bothers him frequently.  He has episodes of nonexertional chest pain, no particular trigger. Uses CPAP at times but not regularly.   Social: Requires assistance with transportation. He has 54 year old and 93 year old daughter living with him on  the weekend. Requires transportation assistance.    ECG (personally reviewed): NSR, LVH, inferolateral ST changes  REDS clip 41%  Labs (5/22): K 3.9, creatinine 1.6  PMH: 1. HTN 2. Type 2 diabetes 3. H/o cocaine abuse: Positive UDS 1/22.  4. CVA 2018 5. OSA: Uses CPAP.  6. Chronic systolic CHF: Nonischemic cardiomyopathy.  - LHC (2015): Normal coronaries.  - Echo (2021): EF 40-45%. - Echo (1/22): EF <20%, moderate LV dilation, moderately decreased RV systolic function, severe LAE.  - Echo (5/22): EF 50%, moderate LV dilation, normal RV.  7. Depression 8. CKD stage 3 9. Hyperlipidemia  Current Outpatient Medications  Medication Sig Dispense Refill  . aspirin EC 81 MG tablet Take 1 tablet (81 mg total) by mouth daily. Swallow whole. 30 tablet 11  . atorvastatin (LIPITOR) 80 MG tablet TAKE 1 TABLET BY MOUTH DAILY. 7 tablet 0  . carvedilol (COREG) 25 MG tablet TAKE 1 TABLET (25 MG TOTAL) BY MOUTH 2 (TWO) TIMES DAILY. 60 tablet 3  . dapagliflozin propanediol (FARXIGA) 10 MG TABS tablet Take 1 tablet (10 mg total) by mouth daily. 90 tablet 3  . ezetimibe (ZETIA) 10 MG tablet Take 1 tablet (10 mg total) by mouth daily. 90 tablet 3  . FLUoxetine (PROZAC) 40 MG capsule TAKE 1 CAPSULE (40 MG TOTAL) BY MOUTH DAILY. 30 capsule 6  . furosemide (LASIX) 20 MG tablet Take 1 tablet (20 mg total) by mouth daily. 30 tablet  11  . glucose blood (ONE TOUCH ULTRA TEST) test strip Use as instructed 30 each 12  . ibuprofen (ADVIL) 400 MG tablet Take 1 tablet (400 mg total) by mouth every 6 (six) hours as needed for headache or moderate pain. 30 tablet 0  . sacubitril-valsartan (ENTRESTO) 49-51 MG Take 1 tablet by mouth 2 (two) times daily. 60 tablet 1  . spironolactone (ALDACTONE) 25 MG tablet TAKE 1 TABLET BY MOUTH ONCE DAILY. 34 tablet 1  . timolol (TIMOPTIC) 0.5 % ophthalmic solution Place 1 drop into both eyes daily after breakfast.    . isosorbide-hydrALAZINE (BIDIL) 20-37.5 MG tablet Take 1 tablet  by mouth 3 (three) times daily. 90 tablet 11   No current facility-administered medications for this encounter.    No Known Allergies    Social History   Socioeconomic History  . Marital status: Widowed    Spouse name: Not on file  . Number of children: 2  . Years of education: 52  . Highest education level: Not on file  Occupational History  . Occupation: [employer]  Tobacco Use  . Smoking status: Former Smoker    Years: 20.00    Types: Cigarettes  . Smokeless tobacco: Former Systems developer    Quit date: 11/14/2020  Vaping Use  . Vaping Use: Never used  Substance and Sexual Activity  . Alcohol use: No  . Drug use: No  . Sexual activity: Yes  Other Topics Concern  . Not on file  Social History Narrative   Lives with 2 young children in [place].  Wife died in 05-05-20.  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   Left-handed   Caffeine: occasional tea   Social Determinants of Health   Financial Resource Strain: High Risk  . Difficulty of Paying Living Expenses: Very hard  Food Insecurity: Food Insecurity Present  . Worried About Charity fundraiser in the Last Year: Sometimes true  . Ran Out of Food in the Last Year: Sometimes true  Transportation Needs: Unmet Transportation Needs  . Lack of Transportation (Medical): Yes  . Lack of Transportation (Non-Medical): Yes  Physical Activity: Not on file  Stress: Stress Concern Present  . Feeling of Stress : Very much  Social Connections: Not on file  Intimate Partner Violence: Not on file    Family History  Problem Relation Age of Onset  . Heart attack Father        died @ 32  . Cervical cancer Mother        died @ 76    Vitals:   05/14/2021 1315  BP: 110/82  Pulse: 65  SpO2: 99%  Weight: 84.8 kg (187 lb)   Wt Readings from Last 3 Encounters:  2021-05-14 84.8 kg (187 lb)  04/24/21 84.4 kg (186 lb)  04/10/21 85.7 kg (189 lb)   PHYSICAL EXAM: General: NAD Neck: No JVD, no thyromegaly or thyroid  nodule.  Lungs: Clear to auscultation bilaterally with normal respiratory effort. CV: Nondisplaced PMI.  Heart regular S1/S2, no S3/S4, no murmur.  No peripheral edema.  No carotid bruit.  Normal pedal pulses.  Abdomen: Soft, nontender, no hepatosplenomegaly, no distention.  Skin: Intact without lesions or rashes.  Neurologic: Alert and oriented x 3.  Psych: Normal affect. Extremities: No clubbing or cyanosis.  HEENT: Normal.   ASSESSMENT & PLAN: 1. Combined systolic CHF: Nonischemic cardiomyopathy.  Due to poorly controlled HTN +/- cocaine abuse.  Normal coronaries on 2015 cath.  Echo  in 1/22 showed EF < 20% with moderate RV dysfunction (off meds, +cocaine).  Echo in 5/22 on meds and off cocaine with EF up to 50%.  Important to maintain BP control and stay off cocaine.  Recentlyl, he has had orthostatic symptoms and BP has been running on the low side.  Also with worse dyspnea than in the past, not altogether predictable.  Weight is down and he does not look volume overloaded on exam but REDS clip 41% suggests volume overload.  -  Start Lasix 20 mg daily, BMET today and in 10 days.  - Continue Entresto 49/51 mg bid. - Continue carvedilol 25 mg bid. - Continue Farxiga 10 mg daily. - Continue spiro 25 mg daily. - Decrease Bidil to 1 tab tid with orthostatic symptoms. - EF is out of ICD range.  2. OSA: Continue CPAP.  3. Tobacco Use Disorder: Stopped smoking in November 2021. - Given dyspnea, will arrange for PFTs to assess for COPD.  4. Cocaine Use Disorder:  UDS + on 12/2020. Has cut back and EF improved on echo.  - Encouraged abstinence.  5. CKD 3: BMET today.   6. Hyperlipidemia:  - Continue atorvastatin  7. Depression: - Continue Prozac.  - Has been referred to Psychiatry per PCP. 8. T2DM: Continue [place].  9. 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 10. Social: Continue HF Paramedicine. HF SW helping with community  resources for substance abuse.  Has food stamps. Needs transportation assistance.   Followup in 3 months with APP.   Loralie Champagne, MD 04/27/21

## 2021-04-27 NOTE — Progress Notes (Signed)
CSW met with pt during clinic visit to address multiple needs.  Pt reports losing his best friend from childhood today- CSW offered support and bereavement counseling options but pt refusing at this time.  CSW followed up on pending [employer] application- they were missing one question on application so CSW completed and sent back in.  Pt brought in multiple papers he wanted help looking at.  Has one from [place] which is a release to request medical documentation for pt- had pt sign and CSW faxed to disability.  Also had a notice from [facility] regarding an appeal which is scheduled for 05/10/21- made sure pt understood importance of attending this meeting.  Pt reports being low on food at this time- provided with donated food box from clinic- he thinks he can get a friend to drive him to the grocery store sometime today or tomorrow.  Will continue to follow and assist as needed  Jorge Ny, [facility] Desk#: [phone] Cell#: [phone]

## 2021-04-27 NOTE — Progress Notes (Signed)
Paramedicine Encounter    Patient ID: Matthew Heman., male    DOB: [birth year], 55 y.o.   MRN: [id number]     Met with Matthew [name] today in the clinic with Dr. Aundra Dubin. Matthew [name] reports some increased shortness of breath recently while seated. Reds Clip reading 41%, Dr. Aundra Dubin started Matthew [name] on 2m lasix once daily. Same was placed in pill box. BIDIL was also decreased to one tablet three times daily, I ensured pill box reflected same. I plan to see Matthew [name] one week. Matthew [name] with plan.    Refills: NONE        ACTION: Home visit completed Next visit planned for one week

## 2021-05-01 ENCOUNTER — Emergency Department (HOSPITAL_COMMUNITY)
Admission: EM | Admit: 2021-05-01 | Discharge: 2021-05-01 | Disposition: A | Discharge status: Home / Self Care | Payer: Medicaid Other | Attending: Emergency Medicine | Admitting: Emergency Medicine

## 2021-05-01 ENCOUNTER — Emergency Department (HOSPITAL_COMMUNITY): Payer: Medicaid Other

## 2021-05-01 ENCOUNTER — Other Ambulatory Visit: Payer: Self-pay

## 2021-05-01 ENCOUNTER — Encounter (HOSPITAL_COMMUNITY): Payer: Self-pay

## 2021-05-01 DIAGNOSIS — F410 Panic disorder [episodic paroxysmal anxiety] without agoraphobia: Secondary | ICD-10-CM | POA: Insufficient documentation

## 2021-05-01 DIAGNOSIS — Z5321 Procedure and treatment not carried out due to patient leaving prior to being seen by health care provider: Secondary | ICD-10-CM | POA: Insufficient documentation

## 2021-05-01 DIAGNOSIS — R064 Hyperventilation: Secondary | ICD-10-CM | POA: Insufficient documentation

## 2021-05-01 LAB — CBC
HCT: 48.7 % (ref 39.0–52.0)
Hemoglobin: 16.7 g/dL (ref 13.0–17.0)
MCH: 31.1 pg (ref 26.0–34.0)
MCHC: 34.3 g/dL (ref 30.0–36.0)
MCV: 90.7 fL (ref 80.0–100.0)
Platelets: 273 10*3/uL (ref 150–400)
RBC: 5.37 MIL/uL (ref 4.22–5.81)
RDW: 12.7 % (ref 11.5–15.5)
WBC: 6.2 10*3/uL (ref 4.0–10.5)
nRBC: 0 % (ref 0.0–0.2)

## 2021-05-01 LAB — BASIC METABOLIC PANEL
Anion gap: 9 (ref 5–15)
BUN: 18 mg/dL (ref 6–20)
CO2: 24 mmol/L (ref 22–32)
Calcium: 9.6 mg/dL (ref 8.9–10.3)
Chloride: 102 mmol/L (ref 98–111)
Creatinine, Ser: 1.67 mg/dL — ABNORMAL HIGH (ref 0.61–1.24)
GFR, Estimated: 48 mL/min — ABNORMAL LOW (ref >60)
Glucose, Bld: 122 mg/dL — ABNORMAL HIGH (ref 70–99)
Potassium: 3.2 mmol/L — ABNORMAL LOW (ref 3.5–5.1)
Sodium: 135 mmol/L (ref 135–145)

## 2021-05-01 LAB — TROPONIN I (HIGH SENSITIVITY): Troponin I (High Sensitivity): 18 ng/L — ABNORMAL HIGH (ref <18)

## 2021-05-01 NOTE — ED Triage Notes (Signed)
Patient BIB [facility] from home, patient report panic attack at home with hyperventilation, unable to be soothed by EMS, hx of same

## 2021-05-01 NOTE — ED Notes (Signed)
Patient refusing EKG

## 2021-05-01 NOTE — ED Notes (Signed)
Pt came to this tech and asked how many people was ahead of him, I told him and he walked away. He came back a few minutes later and said he was leaving. Moving him OTF.

## 2021-05-03 ENCOUNTER — Telehealth (HOSPITAL_COMMUNITY): Payer: Self-pay

## 2021-05-03 NOTE — Telephone Encounter (Signed)
Spoke to Matthew [name] to confirm home visit tomorrow and he reported to me he did not feel good. When asking what was wrong he said, "everything hurts". I advised him to elaborate and he got upset and began crying. [name] was prompted if he needed to be further evaluated and he stated he might need to go to the ER again but did not want to wait. I explained to him the triage process and the importance of staying if he goes to be further evaluated and not leave AMA. He agreed. I asked him if I could come out tomorrow morning due me only having morning availability and he refused sand agreed for me to come out Monday to the clinic when he has his lab appointment. [name] reports he has medications left in his pill box and knows what he needs to take until Monday. I reassured him to seek further medical care if needed and he agreed. Call complete. I will follow up with him via phone tomorrow.

## 2021-05-04 ENCOUNTER — Other Ambulatory Visit: Payer: Self-pay

## 2021-05-04 ENCOUNTER — Encounter (HOSPITAL_COMMUNITY): Payer: Self-pay | Admitting: Cardiology

## 2021-05-05 ENCOUNTER — Other Ambulatory Visit (HOSPITAL_COMMUNITY): Payer: Medicaid Other

## 2021-05-08 ENCOUNTER — Telehealth (HOSPITAL_COMMUNITY): Payer: Self-pay

## 2021-05-08 ENCOUNTER — Inpatient Hospital Stay (HOSPITAL_COMMUNITY): Admission: RE | Admit: 2021-05-08 | Payer: Medicaid Other | Source: Ambulatory Visit

## 2021-05-08 ENCOUNTER — Other Ambulatory Visit (HOSPITAL_COMMUNITY): Payer: Medicaid Other

## 2021-05-08 NOTE — Telephone Encounter (Signed)
I attempted to reach Matthew [name] multiple times today without success about his lab appointment and plan for [place] to meet today at clinic with no success. I will continue to attempt to reach him during the week.

## 2021-05-10 ENCOUNTER — Ambulatory Visit: Payer: Medicaid Other | Admitting: Family Medicine

## 2021-05-15 ENCOUNTER — Other Ambulatory Visit (HOSPITAL_COMMUNITY): Payer: Self-pay

## 2021-05-15 ENCOUNTER — Telehealth (HOSPITAL_COMMUNITY): Payer: Self-pay

## 2021-05-15 NOTE — Progress Notes (Signed)
Paramedicine Encounter    Patient ID: Matthew Heman., male    DOB: 19-May-1966, 55 y.o.   MRN: [id number]   Patient Care Team: Charlott Rakes, MD as PCP - General (Family Medicine) Jettie Booze, MD as Consulting Physician (Cardiology) Jettie Booze, MD as Consulting Physician (Cardiology) Jorge Ny, LCSW as Social Worker (Licensed Clinical Social Worker)  Patient Active Problem List   Diagnosis Date Noted  . Impacted cerumen of right ear 03/24/2021  . MDD (major depressive disorder), recurrent severe, without psychosis ([place]) 01/03/2021  . Acute exacerbation of congestive heart failure ([place]) 12/24/2020  . Acute respiratory failure with hypoxia ([place]) 02/15/2020  . Acute on chronic combined systolic and diastolic CHF (congestive heart failure) ([place]) 02/15/2020  . Hypertensive urgency 02/15/2020  . CHF (congestive heart failure) ([place]) 02/15/2020  . 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 Expressive aphasia 02/20/2017  . Mixed hyperlipidemia   . History of stroke 01/31/2017  . Erectile dysfunction 07/30/2016  . OSA (obstructive sleep apnea) 05/10/2015  . CKD (chronic kidney disease) stage 2, GFR 60-89 ml/min 03/10/2014  . Diabetes mellitus type II, controlled ([place]) 03/10/2014  . Chronic combined systolic and diastolic congestive heart failure ([place]) 03/03/2014  . Smoker   . Morbid obesity ([place])   . Hypertension 03/02/2014    Current Outpatient Medications:  .  aspirin EC 81 MG tablet, Take 1 tablet (81 mg total) by mouth daily. Swallow whole., Disp: 30 tablet, Rfl: 11 .  atorvastatin (LIPITOR) 80 MG tablet, TAKE 1 TABLET BY MOUTH DAILY., Disp: 7 tablet, Rfl: 0 .  carvedilol (COREG) 25 MG tablet, TAKE 1 TABLET (25 MG TOTAL) BY MOUTH 2 (TWO) TIMES DAILY., Disp: 60 tablet, Rfl: 3 .  dapagliflozin propanediol (FARXIGA) 10 MG TABS tablet, Take 1 tablet (10 mg total) by mouth daily., Disp: 90 tablet, Rfl: 3 .  ezetimibe (ZETIA) 10 MG tablet, Take 1 tablet  (10 mg total) by mouth daily., Disp: 90 tablet, Rfl: 3 .  FLUoxetine (PROZAC) 40 MG capsule, TAKE 1 CAPSULE (40 MG TOTAL) BY MOUTH DAILY., Disp: 30 capsule, Rfl: 6 .  furosemide (LASIX) 20 MG tablet, Take 1 tablet (20 mg total) by mouth daily., Disp: 30 tablet, Rfl: 11 .  glucose blood (ONE TOUCH ULTRA TEST) test strip, Use as instructed, Disp: 30 each, Rfl: 12 .  ibuprofen (ADVIL) 400 MG tablet, Take 1 tablet (400 mg total) by mouth every 6 (six) hours as needed for headache or moderate pain., Disp: 30 tablet, Rfl: 0 .  isosorbide-hydrALAZINE (BIDIL) 20-37.5 MG tablet, Take 1 tablet by mouth 3 (three) times daily., Disp: 90 tablet, Rfl: 11 .  sacubitril-valsartan (ENTRESTO) 49-51 MG, Take 1 tablet by mouth 2 (two) times daily., Disp: 60 tablet, Rfl: 1 .  spironolactone (ALDACTONE) 25 MG tablet, TAKE 1 TABLET BY MOUTH ONCE DAILY., Disp: 34 tablet, Rfl: 1 .  timolol (TIMOPTIC) 0.5 % ophthalmic solution, Place 1 drop into both eyes daily after breakfast., Disp: , Rfl:  No Known Allergies   Social History   Socioeconomic History  . Marital status: Widowed    Spouse name: Not on file  . Number of children: 2  . Years of education: 31  . Highest education level: Not on file  Occupational History  . Occupation: [employer]  Tobacco Use  . Smoking status: Former Smoker    Years: 20.00    Types: Cigarettes  . Smokeless tobacco: Former Systems developer    Quit date: 11/14/2020  Vaping Use  .  Vaping Use: Never used  Substance and Sexual Activity  . Alcohol use: No  . Drug use: No  . Sexual activity: Yes  Other Topics Concern  . Not on file  Social History Narrative   Lives with 2 young children in [place].  Wife died in 05/05/20.  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   Left-handed   Caffeine: occasional tea   Social Determinants of Health   Financial Resource Strain: High Risk  . Difficulty of Paying Living Expenses: Very hard  Food Insecurity: Food Insecurity Present   . Worried About Charity fundraiser in the Last Year: Sometimes true  . Ran Out of Food in the Last Year: Sometimes true  Transportation Needs: Unmet Transportation Needs  . Lack of Transportation (Medical): Yes  . Lack of Transportation (Non-Medical): Yes  Physical Activity: Not on file  Stress: Stress Concern Present  . Feeling of Stress : Very much  Social Connections: Not on file  Intimate Partner Violence: Not on file    Physical Exam Vitals reviewed.  Constitutional:      Appearance: Normal appearance. He is normal weight.  HENT:     Head: Normocephalic.     Nose: Nose normal.     Mouth/Throat:     Mouth: Mucous membranes are moist.     Pharynx: Oropharynx is clear.  Eyes:     Conjunctiva/sclera: Conjunctivae normal.     Pupils: Pupils are equal, round, and reactive to light.  Cardiovascular:     Rate and Rhythm: Normal rate and regular rhythm.     Pulses: Normal pulses.     Heart sounds: Normal heart sounds.  Pulmonary:     Effort: Pulmonary effort is normal.     Breath sounds: Normal breath sounds.  Abdominal:     General: Abdomen is flat.     Palpations: Abdomen is soft.  Musculoskeletal:        General: Normal range of motion.     Cervical back: Normal range of motion.     Right lower leg: No edema.     Left lower leg: No edema.  Skin:    General: Skin is warm and dry.     Capillary Refill: Capillary refill takes less than 2 seconds.  Neurological:     General: No focal deficit present.     Mental Status: He is alert. Mental status is at baseline.  Psychiatric:        Mood and Affect: Mood normal.    Arrived for home visit for Matthew [name] who was alert and oriented reporting to be feeling tired and anxious today. Matthew [name] stated to me his anxiety has worsened and he feels as if he needs medication for his anxiety attacks. He reports having anxiety attacks as often as 4 times a day. I advised Matthew [name] it would be up to his PCP to be prescribed anxiety  medications or not. He understood and requested I reach out to the PCP on his behalf, I advised him an appointment would likely be neccessary. I will make Dr. Margarita Rana aware of same.  Matthew [name] reports he has episodes of chest pain and dizziness with shortness of breath throughout the day but reports he thinks it his anxiety. I obtained vitals and they are as noted. Matthew [name] had not yet had his morning medications during our visit but plans to take them following appointment. I reviewed medications and confirmed pills for pill box. One pill box filled.  Matthew [name] agreed to home visit in one week. We reviewed upcoming appointments and same were wrote on his calendar.    He received a letter from [facility] reporting his application was incomplete. I had him sign a new one and we will submit it accordingly.    Refills: Zetia    Future Appointments  Date Time Provider [place]  06/06/2021  3:00 PM [facility] PA/NP [facility] None  06/21/2021  3:30 PM Charlott Rakes, MD [facility] None     ACTION: Home visit completed Next visit planned for one week

## 2021-05-15 NOTE — Telephone Encounter (Signed)
Spoke to Matthew [name] who agreed with home visit today at 1145.

## 2021-05-16 ENCOUNTER — Other Ambulatory Visit: Payer: Self-pay | Admitting: Family Medicine

## 2021-05-16 ENCOUNTER — Other Ambulatory Visit (HOSPITAL_COMMUNITY): Payer: Self-pay

## 2021-05-16 ENCOUNTER — Other Ambulatory Visit: Payer: Self-pay

## 2021-05-16 MED ORDER — HYDROXYZINE HCL 25 MG PO TABS
25.0000 mg | ORAL_TABLET | Freq: Three times a day (TID) | ORAL | 1 refills | Status: DC | PRN
Start: 2021-05-16 — End: 2021-08-18
  Filled 2021-05-16: qty 60, 20d supply, fill #0
  Filled 2021-06-14: qty 60, 20d supply, fill #1

## 2021-05-16 NOTE — Progress Notes (Signed)
Paramedicine Encounter    Patient ID: Matthew Heman., male    DOB: 06/11/1966, 55 y.o.   MRN: [id number]  Hydroxyzine picked up at [facility] and delivered to patient at 1700 on 5/31. Patient instructed to take one tablet 3 times daily as needed for anxiety. Patient agreed and was grateful for same.  I will see [name] in one week.    ACTION: Home visit completed

## 2021-05-17 ENCOUNTER — Telehealth (HOSPITAL_COMMUNITY): Payer: Self-pay | Admitting: Licensed Clinical Social Worker

## 2021-05-17 NOTE — Telephone Encounter (Signed)
CSW assisted in setting up a ride to [place] to get established with counseling services- ride set for 12:45pm- pt aware  CSW also reached out to [employer]- he has been approved for services so pt will be able to utilize this for future non cone transports  Will continue to follow and assist as needed  Jorge Ny, [facility] Desk#: [phone] Cell#: [phone]

## 2021-05-19 ENCOUNTER — Telehealth (HOSPITAL_COMMUNITY): Payer: Self-pay | Admitting: Licensed Clinical Social Worker

## 2021-05-19 NOTE — Telephone Encounter (Signed)
CSW received call from pt requesting help with transportation to [place] this afternoon for initial counseling appt.    CSW able to set up through [facility] but informed pt he will need to find alternative transport to future appts- he has been accepted to [employer] and has their number- told him he needs to call them today to schedule for his next appt on Monday.  Will continue to follow and assist as needed  Jorge Ny, [facility] Desk#: [phone] Cell#: [phone]

## 2021-05-23 ENCOUNTER — Other Ambulatory Visit: Payer: Self-pay

## 2021-05-23 ENCOUNTER — Other Ambulatory Visit (HOSPITAL_COMMUNITY): Payer: Self-pay | Admitting: *Deleted

## 2021-05-23 MED ORDER — EZETIMIBE 10 MG PO TABS
10.0000 mg | ORAL_TABLET | Freq: Every day | ORAL | 3 refills | Status: DC
  Filled 2021-05-23 – 2021-06-14 (×3): qty 30, 30d supply, fill #0

## 2021-05-29 ENCOUNTER — Other Ambulatory Visit (HOSPITAL_COMMUNITY): Payer: Self-pay

## 2021-05-29 NOTE — Progress Notes (Signed)
Paramedicine Encounter    Patient ID: Matthew Heman., male    DOB: 08-13-1966, 55 y.o.   MRN: [id number]   Patient Care Team: Charlott Rakes, MD as PCP - General (Family Medicine) Jettie Booze, MD as Consulting Physician (Cardiology) Jettie Booze, MD as Consulting Physician (Cardiology) Jorge Ny, LCSW as Social Worker (Licensed Clinical Social Worker)  Patient Active Problem List   Diagnosis Date Noted   Impacted cerumen of right ear 03/24/2021   MDD (major depressive disorder), recurrent severe, without psychosis ([place]) 01/03/2021   Acute exacerbation of congestive heart failure ([place]) 12/24/2020   Acute respiratory failure with hypoxia ([place]) 02/15/2020   Acute on chronic combined systolic and diastolic CHF (congestive heart failure) ([place]) 02/15/2020   Hypertensive urgency 02/15/2020   CHF (congestive heart failure) ([place]) 02/15/2020   Depression due to physical illness 11/18/2018   Expressive aphasia 02/20/2017   Mixed hyperlipidemia    History of stroke 01/31/2017   Erectile dysfunction 07/30/2016   OSA (obstructive sleep apnea) 05/10/2015   CKD (chronic kidney disease) stage 2, GFR 60-89 ml/min 03/10/2014   Diabetes mellitus type II, controlled ([place]) 03/10/2014   Chronic combined systolic and diastolic congestive heart failure ([place]) 03/03/2014   Smoker    Morbid obesity ([place])    Hypertension 03/02/2014    Current Outpatient Medications:    aspirin EC 81 MG tablet, Take 1 tablet (81 mg total) by mouth daily. Swallow whole., Disp: 30 tablet, Rfl: 11   atorvastatin (LIPITOR) 80 MG tablet, TAKE 1 TABLET BY MOUTH DAILY., Disp: 7 tablet, Rfl: 0   carvedilol (COREG) 25 MG tablet, TAKE 1 TABLET (25 MG TOTAL) BY MOUTH 2 (TWO) TIMES DAILY., Disp: 60 tablet, Rfl: 3   dapagliflozin propanediol (FARXIGA) 10 MG TABS tablet, Take 1 tablet (10 mg total) by mouth daily., Disp: 90 tablet, Rfl: 3   ezetimibe (ZETIA) 10 MG tablet, Take 1 tablet (10 mg total) by mouth  daily., Disp: 90 tablet, Rfl: 3   FLUoxetine (PROZAC) 40 MG capsule, TAKE 1 CAPSULE (40 MG TOTAL) BY MOUTH DAILY., Disp: 30 capsule, Rfl: 6   furosemide (LASIX) 20 MG tablet, Take 1 tablet (20 mg total) by mouth daily., Disp: 30 tablet, Rfl: 11   glucose blood (ONE TOUCH ULTRA TEST) test strip, Use as instructed, Disp: 30 each, Rfl: 12   hydrOXYzine (ATARAX/VISTARIL) 25 MG tablet, Take 1 tablet (25 mg total) by mouth 3 (three) times daily as needed for anxiety., Disp: 60 tablet, Rfl: 1   ibuprofen (ADVIL) 400 MG tablet, Take 1 tablet (400 mg total) by mouth every 6 (six) hours as needed for headache or moderate pain., Disp: 30 tablet, Rfl: 0   isosorbide-hydrALAZINE (BIDIL) 20-37.5 MG tablet, Take 1 tablet by mouth 3 (three) times daily., Disp: 90 tablet, Rfl: 11   sacubitril-valsartan (ENTRESTO) 49-51 MG, Take 1 tablet by mouth 2 (two) times daily., Disp: 60 tablet, Rfl: 1   spironolactone (ALDACTONE) 25 MG tablet, TAKE 1 TABLET BY MOUTH ONCE DAILY., Disp: 34 tablet, Rfl: 1   timolol (TIMOPTIC) 0.5 % ophthalmic solution, Place 1 drop into both eyes daily after breakfast., Disp: , Rfl:  No Known Allergies   Social History   Socioeconomic History   Marital status: Widowed    Spouse name: Not on file   Number of children: 2   Years of education: 14   Highest education level: Not on file  Occupational History   Occupation: [employer]  Tobacco Use   Smoking status:  Former    Years: 20.00    Pack years: 0.00    Types: Cigarettes   Smokeless tobacco: Former    Quit date: 11/14/2020  Vaping Use   Vaping Use: Never used  Substance and Sexual Activity   Alcohol use: No   Drug use: No   Sexual activity: Yes  Other Topics Concern   Not on file  Social History Narrative   Lives with 2 young children in [place].  Wife died in 21-May-2020.  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   Left-handed   Caffeine: occasional tea   Social Determinants of Systems developer Strain: High Risk   Difficulty of Paying Living Expenses: Very hard  Food Insecurity: Food Insecurity Present   Worried About Charity fundraiser in the Last Year: Sometimes true   Arboriculturist in the Last Year: Sometimes true  Transportation Needs: Unmet Transportation Needs   Lack of Transportation (Medical): Yes   Lack of Transportation (Non-Medical): Yes  Physical Activity: Not on file  Stress: Stress Concern Present   Feeling of Stress : Very much  Social Connections: Not on file  Intimate Partner Violence: Not on file    Physical Exam Vitals reviewed.  Constitutional:      Appearance: He is normal weight.  HENT:     Head: Normocephalic.     Nose: Nose normal.     Mouth/Throat:     Mouth: Mucous membranes are moist.     Pharynx: Oropharynx is clear.  Eyes:     Conjunctiva/sclera: Conjunctivae normal.     Pupils: Pupils are equal, round, and reactive to light.  Cardiovascular:     Rate and Rhythm: Normal rate and regular rhythm.     Pulses: Normal pulses.     Heart sounds: Normal heart sounds.  Pulmonary:     Effort: Pulmonary effort is normal.     Breath sounds: Normal breath sounds.  Abdominal:     General: Abdomen is flat.     Palpations: Abdomen is soft.  Musculoskeletal:        General: Normal range of motion.     Cervical back: Normal range of motion.     Right lower leg: No edema.     Left lower leg: No edema.  Skin:    General: Skin is warm and dry.     Capillary Refill: Capillary refill takes less than 2 seconds.  Neurological:     General: No focal deficit present.     Mental Status: He is alert. Mental status is at baseline.  Psychiatric:        Mood and Affect: Mood normal.    Arrived for home visit for Matthew [name] who was alert and oriented. Matthew [name] reports he has been at family services all day discussing his fiances with the employees there as well as potentially selling his town home. I asked Matthew [name] what his plan was and he reports he  is not going to sell his home right now but is thinking about it and says he is unsure where he would go if he did sell it. I advised Matthew [name] to keep me informed. He agreed. Assessment and vitals are as noted. Matthew [name] reports he is always dizzy but his breathing and chest pain have improved a lot. He is taking Hydroxyzine 2X's daily for anxiety and says this is helping a lot. Matthew [name] medications were reviewed and confirmed. Pill box  filled accordingly. Matthew [name] agreed with home visit in one week in clinic. I will call and remind him in one week.   Refills: Zetia (Sunday, Monday. Tuesday AM) Lasix Atorvastatin Prozac      Future Appointments  Date Time Provider [place]  06/06/2021  3:00 PM [facility] PA/NP [facility] None  06/21/2021  3:30 PM Charlott Rakes, MD [facility] None     ACTION: Home visit completed

## 2021-05-30 ENCOUNTER — Other Ambulatory Visit: Payer: Self-pay

## 2021-06-05 ENCOUNTER — Other Ambulatory Visit: Payer: Self-pay

## 2021-06-05 ENCOUNTER — Other Ambulatory Visit (HOSPITAL_COMMUNITY): Payer: Self-pay | Admitting: *Deleted

## 2021-06-05 ENCOUNTER — Other Ambulatory Visit: Payer: Self-pay | Admitting: Behavioral Health

## 2021-06-05 MED ORDER — ATORVASTATIN CALCIUM 80 MG PO TABS
ORAL_TABLET | Freq: Every day | ORAL | 3 refills | Status: DC
  Filled 2021-06-05 – 2021-06-16 (×2): qty 30, 30d supply, fill #0

## 2021-06-06 ENCOUNTER — Encounter (HOSPITAL_COMMUNITY): Payer: Self-pay

## 2021-06-06 ENCOUNTER — Ambulatory Visit (HOSPITAL_COMMUNITY)
Admission: RE | Admit: 2021-06-06 | Discharge: 2021-06-06 | Disposition: A | Discharge status: Home / Self Care | Payer: Medicaid Other | Source: Ambulatory Visit | Attending: Family Medicine | Admitting: Family Medicine

## 2021-06-06 ENCOUNTER — Other Ambulatory Visit (HOSPITAL_COMMUNITY): Payer: Self-pay

## 2021-06-06 ENCOUNTER — Other Ambulatory Visit: Payer: Self-pay

## 2021-06-06 VITALS — BP 130/80 | HR 69 | Wt 187.8 lb

## 2021-06-06 DIAGNOSIS — F1410 Cocaine abuse, uncomplicated: Secondary | ICD-10-CM | POA: Insufficient documentation

## 2021-06-06 DIAGNOSIS — I428 Other cardiomyopathies: Secondary | ICD-10-CM | POA: Insufficient documentation

## 2021-06-06 DIAGNOSIS — E782 Mixed hyperlipidemia: Secondary | ICD-10-CM

## 2021-06-06 DIAGNOSIS — G4733 Obstructive sleep apnea (adult) (pediatric): Secondary | ICD-10-CM | POA: Insufficient documentation

## 2021-06-06 DIAGNOSIS — N1831 Chronic kidney disease, stage 3a: Secondary | ICD-10-CM | POA: Insufficient documentation

## 2021-06-06 DIAGNOSIS — F17200 Nicotine dependence, unspecified, uncomplicated: Secondary | ICD-10-CM

## 2021-06-06 DIAGNOSIS — Z79899 Other long term (current) drug therapy: Secondary | ICD-10-CM | POA: Insufficient documentation

## 2021-06-06 DIAGNOSIS — Z7982 Long term (current) use of aspirin: Secondary | ICD-10-CM | POA: Insufficient documentation

## 2021-06-06 DIAGNOSIS — I5082 Biventricular heart failure: Secondary | ICD-10-CM | POA: Insufficient documentation

## 2021-06-06 DIAGNOSIS — E1121 Type 2 diabetes mellitus with diabetic nephropathy: Secondary | ICD-10-CM

## 2021-06-06 DIAGNOSIS — Z87891 Personal history of nicotine dependence: Secondary | ICD-10-CM | POA: Insufficient documentation

## 2021-06-06 DIAGNOSIS — Z8249 Family history of ischemic heart disease and other diseases of the circulatory system: Secondary | ICD-10-CM | POA: Insufficient documentation

## 2021-06-06 DIAGNOSIS — E785 Hyperlipidemia, unspecified: Secondary | ICD-10-CM | POA: Insufficient documentation

## 2021-06-06 DIAGNOSIS — Z7984 Long term (current) use of oral hypoglycemic drugs: Secondary | ICD-10-CM | POA: Insufficient documentation

## 2021-06-06 DIAGNOSIS — Z791 Long term (current) use of non-steroidal anti-inflammatories (NSAID): Secondary | ICD-10-CM | POA: Insufficient documentation

## 2021-06-06 DIAGNOSIS — F0631 Mood disorder due to known physiological condition with depressive features: Secondary | ICD-10-CM

## 2021-06-06 DIAGNOSIS — I130 Hypertensive heart and chronic kidney disease with heart failure and stage 1 through stage 4 chronic kidney disease, or unspecified chronic kidney disease: Secondary | ICD-10-CM | POA: Insufficient documentation

## 2021-06-06 DIAGNOSIS — Z8673 Personal history of transient ischemic attack (TIA), and cerebral infarction without residual deficits: Secondary | ICD-10-CM

## 2021-06-06 DIAGNOSIS — I5042 Chronic combined systolic (congestive) and diastolic (congestive) heart failure: Secondary | ICD-10-CM

## 2021-06-06 DIAGNOSIS — E1122 Type 2 diabetes mellitus with diabetic chronic kidney disease: Secondary | ICD-10-CM | POA: Insufficient documentation

## 2021-06-06 DIAGNOSIS — F32A Depression, unspecified: Secondary | ICD-10-CM | POA: Insufficient documentation

## 2021-06-06 LAB — BASIC METABOLIC PANEL
Anion gap: 8 (ref 5–15)
BUN: 15 mg/dL (ref 6–20)
CO2: 27 mmol/L (ref 22–32)
Calcium: 9 mg/dL (ref 8.9–10.3)
Chloride: 104 mmol/L (ref 98–111)
Creatinine, Ser: 1.49 mg/dL — ABNORMAL HIGH (ref 0.61–1.24)
GFR, Estimated: 55 mL/min — ABNORMAL LOW (ref >60)
Glucose, Bld: 124 mg/dL — ABNORMAL HIGH (ref 70–99)
Potassium: 3.8 mmol/L (ref 3.5–5.1)
Sodium: 139 mmol/L (ref 135–145)

## 2021-06-06 NOTE — Addendum Note (Signed)
Encounter addended by: Louann Liv, LCSW on: 06/06/2021 4:45 PM  Actions taken: Clinical Note Signed

## 2021-06-06 NOTE — Progress Notes (Signed)
Patient requested to speak with CSW. Patient shared he is struggling financially this month and asked for some assistance. CSW discussed community resources and provided patient with a [employer] gift card through the Patient Care Fund to assist with some food to bridge the gap for food insecurity. Patient grateful for the support and aware of community resources for further support. Raquel Sarna, [place], [place]

## 2021-06-06 NOTE — Progress Notes (Signed)
ReDS Vest / Clip - 06/06/21 1600       ReDS Vest / Clip   Station Marker C    Ruler Value 30    ReDS Value Range Moderate volume overload    ReDS Actual Value 37    Anatomical Comments sitting

## 2021-06-06 NOTE — Patient Instructions (Signed)
STOP Farxiga 10 mg, one tab daily  Labs today We will only contact you if something comes back abnormal or we need to make some changes. Otherwise no news is good news!  Your physician recommends that you schedule a follow-up appointment in: 3 months with Dr Aundra Dubin  Do the following things EVERYDAY: Weigh yourself in the morning before breakfast. Write it down and keep it in a log. Take your medicines as prescribed Eat low salt foods--Limit salt (sodium) to 2000 mg per day.  Stay as active as you can everyday Limit all fluids for the day to less than 2 liters   At the [facility], you and your health needs are our priority. As part of our continuing mission to provide you with exceptional heart care, we have created designated Provider Care Teams. These Care Teams include your primary Cardiologist (physician) and Advanced Practice Providers (APPs- Physician Assistants and Nurse Practitioners) who all work together to provide you with the care you need, when you need it.   You may see any of the following providers on your designated Care Team at your next follow up: Dr Glori Bickers Dr Loralie Champagne Dr Patrice Paradise, NP Lyda Jester, [place] Ginnie Smart Audry Riles, PharmD   Please be sure to bring in all your medications bottles to every appointment.   If you have any questions or concerns before your next appointment please send [place] a message through [place] or call our office at [phone].    TO LEAVE A MESSAGE FOR THE NURSE SELECT OPTION 2, PLEASE LEAVE A MESSAGE INCLUDING: YOUR NAME DATE OF BIRTH CALL BACK NUMBER REASON FOR CALL**this is important as we prioritize the call backs  YOU WILL RECEIVE A CALL BACK THE SAME DAY AS LONG AS YOU CALL BEFORE 4:00 PM

## 2021-06-06 NOTE — Progress Notes (Addendum)
Advanced Heart Failure Clinic Note    PCP: Charlott Rakes, MD HF Cardiologist: Dr. Aundra Dubin  HPI: Matthew Petersonis a 55 y.o. with PMH significant for combined systolic CHF with biventricular failure, T2DM, HTN, OSA, tobacco use, HLD, cocaine use, poor medication adherence, depression,  CVA, and CKD 3a.      He was first admitted with heart failure symptoms in 2015 in the setting of long standing uncontrolled hypertension and smoking.  Had a LHC with normal cors.    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  Was not followed by cardiology or hospitalized for CHF during this time period.     March 2021 he began having CHF exacerbations after running out of meds between admissions.  Left AMA in March. Another similar admission in July, he was out of his medications at that time as well.     Most recently he was admitted 12/2020 with CHF exacerbation after running out of his medications.  He was diuresed, put on an excellent medication regimen. He also endorsed suicidal ideation at that time and was subsequently admitted to behavioral health. UDS was positive for cocaine, echo showed EF < 20% with moderate RV dysfunction.   He was seen in the HF clinic 02/08/21 and lasix was cut back to 20 mg daily, later Lasix was stopped.   Echo was done in 5/22 with EF improved to 50%.   He returns today for followup of CHF.  Weight was down 4 lbs. Main complaint is worsening of exertional dyspnea.  He tries to walk about 1/2 mile/day but gets short of breath on his walk.  Dyspnea seems to wax and wane, worse on some days than others.  He still has episodes of lightheadedness with standing, this bothers him frequently.  He has episodes of nonexertional chest pain, no particular trigger. Uses CPAP at times but not regularly.   Social: Requires assistance with transportation. He has 54 year old and 7 year old daughter living with him on  the weekend. Requires transportation assistance.    Today he returns for HF follow up. When he takes his morning and evening meds, he goes to sleep and feels poorly. Still SOB with minimal physical exertion. Has intermittent CP, resolves spontaneously. Dizzy when he wakes, spontaneously resolves. Denies increasing SOB, edema, or PND/Orthopnea. Appetite ok. No fever or chills. Weight at home 185-195 pounds. Taking all medications. Using CPAP every night. Has not used cocaine this month. Paramedicine says he is not taking his meds consistently.  ECG (personally reviewed): NSR 69 bpm, LVH, inferolateral ST changes  REDS clip 37%  Labs (5/22): K 3.9, creatinine 1.6  PMH: 1. HTN 2. Type 2 diabetes 3. H/o cocaine abuse: Positive UDS 1/22.  4. CVA 2018 5. OSA: Uses CPAP.  6. Chronic systolic CHF: Nonischemic cardiomyopathy.  - LHC (2015): Normal coronaries.  - Echo (2021): EF 40-45%. - Echo (1/22): EF <20%, moderate LV dilation, moderately decreased RV systolic function, severe LAE.  - Echo (5/22): EF 50%, moderate LV dilation, normal RV.  7. Depression 8. CKD stage 3 9. Hyperlipidemia  Current Outpatient Medications  Medication Sig Dispense Refill   aspirin EC 81 MG tablet Take 1 tablet (81 mg total) by mouth daily. Swallow whole. 30 tablet 11   atorvastatin (LIPITOR) 80 MG tablet TAKE 1 TABLET BY MOUTH DAILY. 30 tablet 3   carvedilol (COREG) 25 MG tablet TAKE 1 TABLET (25 MG TOTAL) BY  MOUTH 2 (TWO) TIMES DAILY. 60 tablet 3   dapagliflozin propanediol (FARXIGA) 10 MG TABS tablet Take 1 tablet (10 mg total) by mouth daily. 90 tablet 3   ezetimibe (ZETIA) 10 MG tablet Take 1 tablet (10 mg total) by mouth daily. 90 tablet 3   FLUoxetine (PROZAC) 40 MG capsule TAKE 1 CAPSULE (40 MG TOTAL) BY MOUTH DAILY. 30 capsule 6   furosemide (LASIX) 20 MG tablet Take 1 tablet (20 mg total) by mouth daily. 30 tablet 11   glucose blood (ONE TOUCH ULTRA TEST) test strip Use as instructed 30 each 12    hydrOXYzine (ATARAX/VISTARIL) 25 MG tablet Take 1 tablet (25 mg total) by mouth 3 (three) times daily as needed for anxiety. 60 tablet 1   ibuprofen (ADVIL) 400 MG tablet Take 1 tablet (400 mg total) by mouth every 6 (six) hours as needed for headache or moderate pain. 30 tablet 0   isosorbide-hydrALAZINE (BIDIL) 20-37.5 MG tablet Take 1 tablet by mouth 3 (three) times daily. 90 tablet 11   sacubitril-valsartan (ENTRESTO) 49-51 MG Take 1 tablet by mouth 2 (two) times daily. 60 tablet 1   spironolactone (ALDACTONE) 25 MG tablet TAKE 1 TABLET BY MOUTH ONCE DAILY. 34 tablet 1   timolol (TIMOPTIC) 0.5 % ophthalmic solution Place 1 drop into both eyes daily after breakfast.     No current facility-administered medications for this encounter.   No Known Allergies  Social History   Socioeconomic History   Marital status: Widowed    Spouse name: Not on file   Number of children: 2   Years of education: 14   Highest education level: Not on file  Occupational History   Occupation: [employer]  Tobacco Use   Smoking status: Former    Years: 20.00    Pack years: 0.00    Types: Cigarettes   Smokeless tobacco: Former    Quit date: 11/14/2020  Vaping Use   Vaping Use: Never used  Substance and Sexual Activity   Alcohol use: No   Drug use: No   Sexual activity: Yes  Other Topics Concern   Not on file  Social History Narrative   Lives with 2 young children in [place].  Wife died in May 08, 2020.  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   Left-handed   Caffeine: occasional tea   Social Determinants of Radio broadcast assistant Strain: High Risk   Difficulty of Paying Living Expenses: Very hard  Food Insecurity: Landscape architect Present   Worried About Charity fundraiser in the Last Year: Sometimes true   Arboriculturist in the Last Year: Sometimes true  Transportation Needs: Public librarian (Medical): Yes   Lack of Transportation  (Non-Medical): Yes  Physical Activity: Not on file  Stress: Stress Concern Present   Feeling of Stress : Very much  Social Connections: Not on file  Intimate Partner Violence: Not on file   Family History  Problem Relation Age of Onset   Heart attack Father        died @ 58   Cervical cancer Mother        died @ 40   Vitals:   06/10/2021 1513  BP: 130/80  Pulse: 69  SpO2: 99%  Weight: 85.2 kg (187 lb 12.8 oz)   Wt Readings from Last 3 Encounters:  06/10/21 85.2 kg (187 lb 12.8 oz)  05/29/21 86.2 kg (190 lb)  05/15/21 86.2 kg (190 lb)   PHYSICAL EXAM: General:  NAD. No resp difficulty HEENT: Normal Neck: Supple. No JVD. Carotids 2+ bilat; no bruits. No lymphadenopathy or thryomegaly appreciated. Cor: PMI nondisplaced. Regular rate & rhythm. No rubs, gallops or murmurs. Lungs: Clear Abdomen: Soft, nontender, nondistended. No hepatosplenomegaly. No bruits or masses. Good bowel sounds. Extremities: No cyanosis, clubbing, rash, edema Neuro: alert & oriented x 3, cranial nerves grossly intact. Moves all 4 extremities w/o difficulty. Pressured speech, appears intoxicated.  ASSESSMENT & PLAN: 1. Combined systolic CHF: Nonischemic cardiomyopathy.  Due to poorly controlled HTN +/- cocaine abuse.  Normal coronaries on 2015 cath.  Echo in 1/22 showed EF < 20% with moderate RV dysfunction (off meds, +cocaine).  Echo in 5/22 on meds and off cocaine with EF up to 50%.  Important to maintain BP control and stay off cocaine.  Recently, he has had orthostatic symptoms and BP has been running on the low side.  Also with worse dyspnea than in the past, not altogether predictable.  Still struggling with dyspnea and dizziness despite medication changes. Weight is down and he does not look volume overloaded on exam, REDS clip 37%.NYHA II-IIIb - Stop Farxiga (worsening dizziness). - Continue lasix 20 mg daily, may need to increase with stopping [place]. - Continue Entresto 49/51 mg bid.  - Continue  carvedilol 25 mg bid. - Continue spiro 25 mg daily. - Continue Bidil 1 tab tid (recently decreased with orthostatic symptoms). - EF is out of ICD range.  2. OSA: Continue CPAP.  3. Tobacco Use Disorder: Stopped smoking in November 2021. - Given dyspnea, arranged PFTs to assess for COPD.  4. Cocaine Use Disorder:  UDS + on 12/2020. Has cut back and EF improved on echo.  - Encouraged abstinence.  5. CKD 3: BMET today.   6. Hyperlipidemia:  - Continue atorvastatin  7. Depression: - Continue Prozac.  - Has been referred to Psychiatry per PCP. 8. T2DM: Continue [place].  9. 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 10. Social: Continue HF Paramedicine. HF SW helping with community resources for substance abuse.  Has food stamps. Needs transportation assistance.   - Has not been consistently taking medications, unsure of what he is actually taking because he says that his heart medications make him feel tired so he skips doses. Will check labs today, stop [name] and see if dizziness improves. May need to consider switching Entresto to losartan. - Paramedicine will touch base in a couple of weeks about his response to stopping [place]. Appreciate their assistance.  Followup in 3 months.  [place], FNP 06/06/21

## 2021-06-06 NOTE — Progress Notes (Signed)
Paramedicine Encounter    Patient ID: [name], male    DOB: August 16, 1966, 55 y.o.   MRN: [id number]   Kelton Bultman today in clinic where he was seen by Allena Katz NP. Mathayus reporting persistent dizziness and fatigue despite taking medications. I have intermittent visits with him due to scheduling interferences with patient. I stressed importance of allowing weekly visits to monitor him closely. He verbalized agreement. Janett Billow stopped [place] today to see if this will assist with patient complaints of dizziness. I will follow up on same. Patient was reminded he needs to pick up and pay for his own medications and he understood and agreed with plan. He is aware that he now has [employer] transportation and can use this to get to pharmacy, doctors visits and grocery store visits. LCSW Raquel Sarna came in to talk with patient today. Labs drawn. I will follow up next week. Patient given list of medications to pick up at pharmacy.       ACTION: Home visit completed

## 2021-06-12 ENCOUNTER — Other Ambulatory Visit: Payer: Self-pay

## 2021-06-12 ENCOUNTER — Telehealth (HOSPITAL_COMMUNITY): Payer: Self-pay

## 2021-06-12 NOTE — Telephone Encounter (Signed)
Spoke to Matthew [name] who reports he has not yet picked up his medications and has not yet gotten paid his monthly check so he cannot pick them up until he gets paid. We have assisted Matthew [name] several times with medication co-payments and are unable to do so at this time. Matthew [name] understands to pick up meds as soon as possible and to take what he has at home in the mean time. He is agreeable and understands to call me once meds have been picked up.

## 2021-06-14 ENCOUNTER — Other Ambulatory Visit: Payer: Self-pay

## 2021-06-14 ENCOUNTER — Telehealth (HOSPITAL_COMMUNITY): Payer: Self-pay

## 2021-06-14 MED FILL — Carvedilol Tab 25 MG: ORAL | 30 days supply | Qty: 60 | Fill #0 | Status: AC

## 2021-06-14 NOTE — Telephone Encounter (Signed)
Received call from Mr. Scoggin who reports he spoke to [facility] office today and they state his check might have been lost in the mail so they are re-issuing same. He stated his check should be delivered within 5-7 business days and he states he needs the money to pick up his medications now. I called [facility] pharmacy and for 6 prescriptions his copay is $26. I will reach out to LCSW Raquel Sarna to see how we can assist. I will continue to follow up.

## 2021-06-15 ENCOUNTER — Other Ambulatory Visit (HOSPITAL_COMMUNITY): Payer: Self-pay | Admitting: *Deleted

## 2021-06-15 ENCOUNTER — Telehealth (HOSPITAL_COMMUNITY): Payer: Self-pay | Admitting: Licensed Clinical Social Worker

## 2021-06-15 ENCOUNTER — Other Ambulatory Visit: Payer: Self-pay

## 2021-06-15 ENCOUNTER — Other Ambulatory Visit (HOSPITAL_COMMUNITY): Payer: Self-pay

## 2021-06-15 MED ORDER — SPIRONOLACTONE 25 MG PO TABS
25.0000 mg | ORAL_TABLET | Freq: Every day | ORAL | 6 refills | Status: DC
  Filled 2021-06-15: qty 30, 30d supply, fill #0

## 2021-06-15 NOTE — Telephone Encounter (Signed)
CSW received call from Paramedic for assistance with medications. CSW will assist with medication assistance and available if any other needs arise. Raquel Sarna, [place], [place]

## 2021-06-15 NOTE — Progress Notes (Signed)
Paramedicine Encounter    Patient ID: Matthew Heman., male    DOB: 11/16/1966, 55 y.o.   MRN: [id number]   Patient Care Team: Matthew Rakes, MD as PCP - General (Family Medicine) Matthew Booze, MD as Consulting Physician (Cardiology) Matthew Booze, MD as Consulting Physician (Cardiology) Matthew Ny, LCSW as Social Worker (Licensed Clinical Social Worker)  Patient Active Problem List   Diagnosis Date Noted   Impacted cerumen of right ear 03/24/2021   MDD (major depressive disorder), recurrent severe, without psychosis ([place]) 01/03/2021   Acute exacerbation of congestive heart failure ([place]) 12/24/2020   Acute respiratory failure with hypoxia ([place]) 02/15/2020   Acute on chronic combined systolic and diastolic CHF (congestive heart failure) ([place]) 02/15/2020   Hypertensive urgency 02/15/2020   CHF (congestive heart failure) ([place]) 02/15/2020   Depression due to physical illness 11/18/2018   Expressive aphasia 02/20/2017   Mixed hyperlipidemia    History of stroke 01/31/2017   Erectile dysfunction 07/30/2016   OSA (obstructive sleep apnea) 05/10/2015   CKD (chronic kidney disease) stage 2, GFR 60-89 ml/min 03/10/2014   Diabetes mellitus type II, controlled ([place]) 03/10/2014   Chronic combined systolic and diastolic congestive heart failure ([place]) 03/03/2014   Smoker    Morbid obesity ([place])    Hypertension 03/02/2014    Current Outpatient Medications:    aspirin EC 81 MG tablet, Take 1 tablet (81 mg total) by mouth daily. Swallow whole., Disp: 30 tablet, Rfl: 11   atorvastatin (LIPITOR) 80 MG tablet, TAKE 1 TABLET BY MOUTH DAILY., Disp: 30 tablet, Rfl: 3   carvedilol (COREG) 25 MG tablet, TAKE 1 TABLET (25 MG TOTAL) BY MOUTH 2 (TWO) TIMES DAILY., Disp: 60 tablet, Rfl: 3   ezetimibe (ZETIA) 10 MG tablet, Take 1 tablet (10 mg total) by mouth daily., Disp: 90 tablet, Rfl: 3   FLUoxetine (PROZAC) 40 MG capsule, TAKE 1 CAPSULE (40 MG TOTAL) BY MOUTH DAILY., Disp: 30  capsule, Rfl: 6   furosemide (LASIX) 20 MG tablet, Take 1 tablet (20 mg total) by mouth daily., Disp: 30 tablet, Rfl: 11   glucose blood (ONE TOUCH ULTRA TEST) test strip, Use as instructed, Disp: 30 each, Rfl: 12   hydrOXYzine (ATARAX/VISTARIL) 25 MG tablet, Take 1 tablet (25 mg total) by mouth 3 (three) times daily as needed for anxiety., Disp: 60 tablet, Rfl: 1   ibuprofen (ADVIL) 400 MG tablet, Take 1 tablet (400 mg total) by mouth every 6 (six) hours as needed for headache or moderate pain., Disp: 30 tablet, Rfl: 0   isosorbide-hydrALAZINE (BIDIL) 20-37.5 MG tablet, Take 1 tablet by mouth 3 (three) times daily., Disp: 90 tablet, Rfl: 11   sacubitril-valsartan (ENTRESTO) 49-51 MG, Take 1 tablet by mouth 2 (two) times daily., Disp: 60 tablet, Rfl: 1   spironolactone (ALDACTONE) 25 MG tablet, TAKE 1 TABLET BY MOUTH ONCE DAILY., Disp: 30 tablet, Rfl: 6   timolol (TIMOPTIC) 0.5 % ophthalmic solution, Place 1 drop into both eyes daily after breakfast., Disp: , Rfl:  No Known Allergies   Social History   Socioeconomic History   Marital status: Widowed    Spouse name: Not on file   Number of children: 2   Years of education: 14   Highest education level: Not on file  Occupational History   Occupation: [employer]  Tobacco Use   Smoking status: Former    Years: 20.00    Pack years: 0.00    Types: Cigarettes   Smokeless tobacco: Former  Quit date: 11/14/2020  Vaping Use   Vaping Use: Never used  Substance and Sexual Activity   Alcohol use: No   Drug use: No   Sexual activity: Yes  Other Topics Concern   Not on file  Social History Narrative   Lives with 2 young children in [place].  Wife died in Jun 01, 2020.  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   Left-handed   Caffeine: occasional tea   Social Determinants of Radio broadcast assistant Strain: High Risk   Difficulty of Paying Living Expenses: Very hard  Food Insecurity: Food Insecurity Present    Worried About Charity fundraiser in the Last Year: Sometimes true   Arboriculturist in the Last Year: Sometimes true  Transportation Needs: Unmet Transportation Needs   Lack of Transportation (Medical): Yes   Lack of Transportation (Non-Medical): Yes  Physical Activity: Not on file  Stress: Stress Concern Present   Feeling of Stress : Very much  Social Connections: Not on file  Intimate Partner Violence: Not on file    Physical Exam Vitals reviewed.  Constitutional:      Appearance: Normal appearance. He is normal weight.  HENT:     Head: Normocephalic.     Nose: Nose normal.     Mouth/Throat:     Mouth: Mucous membranes are moist.     Pharynx: Oropharynx is clear.  Eyes:     Conjunctiva/sclera: Conjunctivae normal.     Pupils: Pupils are equal, round, and reactive to light.  Cardiovascular:     Rate and Rhythm: Normal rate and regular rhythm.     Pulses: Normal pulses.     Heart sounds: Normal heart sounds.  Pulmonary:     Effort: Pulmonary effort is normal.     Breath sounds: Normal breath sounds.  Abdominal:     General: Abdomen is flat.     Palpations: Abdomen is soft.  Musculoskeletal:        General: No swelling. Normal range of motion.     Cervical back: Normal range of motion.     Right lower leg: No edema.     Left lower leg: No edema.  Skin:    General: Skin is warm and dry.     Capillary Refill: Capillary refill takes less than 2 seconds.  Neurological:     General: No focal deficit present.     Mental Status: He is alert. Mental status is at baseline.  Psychiatric:        Mood and Affect: Mood normal.    Arrived for home visit for Matthew [name] who was alert and oriented reporting to be feeling good but sleepy. He states he has not slept well over night but has been sleeping during the day. He does report since stopping [place] he has been feeling better. Matthew [name] denied chest pain or shortness of breath but reports feeling dizzy. He says this happens often after  getting up. He said he took his morning meds at 10:00 then went back to sleep and is now waking up upon my arrival at 1230. I obtained vitals and attempted to get orthostatics due to him complaining of dizziness while standing. Matthew [name] refused orthostatic vitals despite me attempting to educate him on possibility of dehydration. He continued to refuse same. I reviewed medications and filled two pill boxes for Matthew [name]. Matthew [name] was made aware of upcoming appointments. He agreed with same and I attempted to assist with  setting up transport and he reports he will do it himself. Sticky note reminder left with patient of appointment time and location. Lung sounds clear, no JVD, no edema. Vitals as noted. Home visit complete. I will see patient in two weeks.  Refills: Atorvastatin     Future Appointments  Date Time Provider [place]  06/21/2021  3:30 PM Matthew Rakes, MD [facility] None  09/15/2021  2:20 PM Larey Dresser, MD [facility] None     ACTION: Home visit completed

## 2021-06-16 ENCOUNTER — Other Ambulatory Visit: Payer: Self-pay

## 2021-06-21 ENCOUNTER — Ambulatory Visit: Payer: Medicaid Other | Admitting: Family Medicine

## 2021-06-23 ENCOUNTER — Other Ambulatory Visit: Payer: Self-pay

## 2021-06-29 ENCOUNTER — Telehealth (HOSPITAL_COMMUNITY): Payer: Self-pay

## 2021-06-29 NOTE — Telephone Encounter (Signed)
Spoke to [name] who reports he is unable to have home visit today and requested to reschedule for Monday. I will call Monday to set a time for that afternoon. Call complete.   - I will also reschedule Dr. Margarita Rana appointment for him.   NEW APPOINTMENT- July 27th at 1050

## 2021-07-03 ENCOUNTER — Telehealth (HOSPITAL_COMMUNITY): Payer: Self-pay

## 2021-07-03 NOTE — Telephone Encounter (Signed)
Attempted to reach Mr. Slemmer with no success. I will continue to attempt to reach him.

## 2021-07-12 ENCOUNTER — Ambulatory Visit: Payer: Medicaid Other | Admitting: Family Medicine

## 2021-07-17 ENCOUNTER — Telehealth (HOSPITAL_COMMUNITY): Payer: Self-pay

## 2021-07-17 NOTE — Telephone Encounter (Signed)
Spoke to Matthew [name] who report he has no needs at this time but will call me back later to set up a home visit. I will await his call.

## 2021-07-27 ENCOUNTER — Telehealth (HOSPITAL_COMMUNITY): Payer: Self-pay

## 2021-07-27 NOTE — Telephone Encounter (Signed)
Attempted to reach Matthew [name] for home paramedicine visit no success on call out.

## 2021-07-28 ENCOUNTER — Emergency Department (HOSPITAL_BASED_OUTPATIENT_CLINIC_OR_DEPARTMENT_OTHER): Payer: Medicaid Other

## 2021-07-28 ENCOUNTER — Encounter (HOSPITAL_BASED_OUTPATIENT_CLINIC_OR_DEPARTMENT_OTHER): Payer: Self-pay

## 2021-07-28 ENCOUNTER — Emergency Department (HOSPITAL_BASED_OUTPATIENT_CLINIC_OR_DEPARTMENT_OTHER)
Admission: EM | Admit: 2021-07-28 | Discharge: 2021-07-28 | Disposition: A | Discharge status: Home / Self Care | Payer: Medicaid Other | Attending: Emergency Medicine | Admitting: Emergency Medicine

## 2021-07-28 ENCOUNTER — Other Ambulatory Visit: Payer: Self-pay

## 2021-07-28 DIAGNOSIS — R0602 Shortness of breath: Secondary | ICD-10-CM

## 2021-07-28 DIAGNOSIS — Z79899 Other long term (current) drug therapy: Secondary | ICD-10-CM | POA: Insufficient documentation

## 2021-07-28 DIAGNOSIS — R072 Precordial pain: Secondary | ICD-10-CM

## 2021-07-28 DIAGNOSIS — N182 Chronic kidney disease, stage 2 (mild): Secondary | ICD-10-CM | POA: Insufficient documentation

## 2021-07-28 DIAGNOSIS — I10 Essential (primary) hypertension: Secondary | ICD-10-CM

## 2021-07-28 DIAGNOSIS — Z7982 Long term (current) use of aspirin: Secondary | ICD-10-CM | POA: Insufficient documentation

## 2021-07-28 DIAGNOSIS — I130 Hypertensive heart and chronic kidney disease with heart failure and stage 1 through stage 4 chronic kidney disease, or unspecified chronic kidney disease: Secondary | ICD-10-CM | POA: Insufficient documentation

## 2021-07-28 DIAGNOSIS — Z20822 Contact with and (suspected) exposure to covid-19: Secondary | ICD-10-CM | POA: Insufficient documentation

## 2021-07-28 DIAGNOSIS — Z87891 Personal history of nicotine dependence: Secondary | ICD-10-CM | POA: Insufficient documentation

## 2021-07-28 DIAGNOSIS — E1122 Type 2 diabetes mellitus with diabetic chronic kidney disease: Secondary | ICD-10-CM | POA: Insufficient documentation

## 2021-07-28 DIAGNOSIS — I5043 Acute on chronic combined systolic (congestive) and diastolic (congestive) heart failure: Secondary | ICD-10-CM | POA: Insufficient documentation

## 2021-07-28 LAB — RESP PANEL BY RT-PCR (FLU A&B, COVID) ARPGX2
Influenza A by PCR: NEGATIVE
Influenza B by PCR: NEGATIVE
SARS Coronavirus 2 by RT PCR: POSITIVE — AB

## 2021-07-28 LAB — CBC
HCT: 40.3 % (ref 39.0–52.0)
Hemoglobin: 14.4 g/dL (ref 13.0–17.0)
MCH: 30.6 pg (ref 26.0–34.0)
MCHC: 35.7 g/dL (ref 30.0–36.0)
MCV: 85.7 fL (ref 80.0–100.0)
Platelets: 198 10*3/uL (ref 150–400)
RBC: 4.7 MIL/uL (ref 4.22–5.81)
RDW: 11.9 % (ref 11.5–15.5)
WBC: 4 10*3/uL (ref 4.0–10.5)
nRBC: 0 % (ref 0.0–0.2)

## 2021-07-28 LAB — TROPONIN I (HIGH SENSITIVITY): Troponin I (High Sensitivity): 9 ng/L (ref <18)

## 2021-07-28 LAB — BASIC METABOLIC PANEL
Anion gap: 12 (ref 5–15)
BUN: 18 mg/dL (ref 6–20)
CO2: 21 mmol/L — ABNORMAL LOW (ref 22–32)
Calcium: 9 mg/dL (ref 8.9–10.3)
Chloride: 105 mmol/L (ref 98–111)
Creatinine, Ser: 1.25 mg/dL — ABNORMAL HIGH (ref 0.61–1.24)
GFR, Estimated: >60 mL/min (ref >60)
Glucose, Bld: 87 mg/dL (ref 70–99)
Potassium: 4.6 mmol/L (ref 3.5–5.1)
Sodium: 138 mmol/L (ref 135–145)

## 2021-07-28 LAB — BRAIN NATRIURETIC PEPTIDE: B Natriuretic Peptide: 51.8 pg/mL (ref 0.0–100.0)

## 2021-07-28 MED ORDER — HYDRALAZINE HCL 25 MG PO TABS
50.0000 mg | ORAL_TABLET | Freq: Once | ORAL | Status: AC
  Administered 2021-07-28: 50 mg via ORAL
  Filled 2021-07-28: qty 2

## 2021-07-28 NOTE — ED Triage Notes (Signed)
Patient BIB [facility] with Chest Pain and SOB.  Patient was in Custody when at rest he began to experience a "Panic Attack". Patient states he feels a Tightness in the Center of his Chest along with SOB at rest.  Ambulatory, [facility] 15. Patient visibly anxious.

## 2021-07-28 NOTE — ED Provider Notes (Signed)
[facility] EMERGENCY DEPT Provider Note   CSN: [id number] Arrival date & time: 07/28/21  [id number]     History Chief Complaint  Patient presents with   Chest Pain   Shortness of Breath    Matthew [name]. is a 55 y.o. male.  The history is provided by the patient and the police.  Chest Pain Pain location:  L chest Pain quality: sharp   Pain radiates to:  Does not radiate Pain severity:  Moderate Onset quality:  Sudden Timing:  Constant Progression:  Unchanged Relieved by:  Nothing Worsened by:  Nothing Associated symptoms: cough, fatigue, fever, shortness of breath and vomiting   Associated symptoms comment:  Denies hemoptysis Shortness of Breath Associated symptoms: chest pain, cough, fever and vomiting   Patient history of nonischemic cardiomyopathy, hypertension, diabetes presents with chest pain shortness of breath.  Patient ports had left-sided sharp chest pain has been feeling short of breath.  He reports over the past several days been having fevers, cough and vomiting.  He reports that he feels unwell.  He admits to medication noncompliance.    Past Medical History:  Diagnosis Date   Acute combined systolic and diastolic CHF, NYHA class 3 ([place])    a. 02/2014 Echo: EF 25-30%.   Cardiomyopathy ([place])    a. 02/2014 Echo: EF 25-30%, sev glob HK with inferolat HK->AK, mod conc LVH, Gr 2 DD, Mild MR, sev dil LA.   Depression    DM (diabetes mellitus) ([place])    High cholesterol    Hypertension    Morbid obesity ([place])    Tobacco abuse     Patient Active Problem List   Diagnosis Date Noted   Impacted cerumen of right ear 03/24/2021   MDD (major depressive disorder), recurrent severe, without psychosis ([place]) 01/03/2021   Acute exacerbation of congestive heart failure ([place]) 12/24/2020   Acute respiratory failure with hypoxia ([place]) 02/15/2020   Acute on chronic combined systolic and diastolic CHF (congestive heart failure) ([place]) 02/15/2020   Hypertensive  urgency 02/15/2020   CHF (congestive heart failure) ([place]) 02/15/2020   Depression due to physical illness 11/18/2018   Expressive aphasia 02/20/2017   Mixed hyperlipidemia    History of stroke 01/31/2017   Erectile dysfunction 07/30/2016   OSA (obstructive sleep apnea) 05/10/2015   CKD (chronic kidney disease) stage 2, GFR 60-89 ml/min 03/10/2014   Diabetes mellitus type II, controlled ([place]) 03/10/2014   Chronic combined systolic and diastolic congestive heart failure ([place]) 03/03/2014   Smoker    Morbid obesity ([place])    Hypertension 03/02/2014    Past Surgical History:  Procedure Laterality Date   CATARACT EXTRACTION Right    LEFT HEART CATHETERIZATION WITH CORONARY ANGIOGRAM N/A 03/04/2014   Procedure: LEFT HEART CATHETERIZATION WITH CORONARY ANGIOGRAM;  Surgeon: Burnell Blanks, MD;  Location: [facility] CATH LAB;  Service: Cardiovascular;  Laterality: N/A;   TEE WITHOUT CARDIOVERSION N/A 02/04/2017   Procedure: TRANSESOPHAGEAL ECHOCARDIOGRAM (TEE);  Surgeon: Dorothy Spark, MD;  Location: [facility] ENDOSCOPY;  Service: Cardiovascular;  Laterality: N/A;       Family History  Problem Relation Age of Onset   Heart attack Father        died @ 22   Cervical cancer Mother        died @ 109    Social History   Tobacco Use   Smoking status: Former    Years: 20.00    Types: Cigarettes   Smokeless tobacco: Former    Quit date: 11/14/2020  Vaping Use   Vaping Use: Never used  Substance Use Topics   Alcohol use: No   Drug use: No    Home Medications Prior to Admission medications   Medication Sig Start Date End Date Taking? Authorizing Provider  aspirin EC 81 MG tablet Take 1 tablet (81 mg total) by mouth daily. Swallow whole. 01/24/21   Katherine Roan, MD  atorvastatin (LIPITOR) 80 MG tablet TAKE 1 TABLET BY MOUTH DAILY. 06/05/21 06/05/22  Larey Dresser, MD  carvedilol (COREG) 25 MG tablet TAKE 1 TABLET (25 MG TOTAL) BY MOUTH 2 (TWO) TIMES DAILY. 03/06/21 03/06/22  Rafael Bihari, FNP  ezetimibe (ZETIA) 10 MG tablet Take 1 tablet (10 mg total) by mouth daily. 05/23/21   Larey Dresser, MD  FLUoxetine (PROZAC) 40 MG capsule TAKE 1 CAPSULE (40 MG TOTAL) BY MOUTH DAILY. 03/23/21 03/23/22  Charlott Rakes, MD  furosemide (LASIX) 20 MG tablet Take 1 tablet (20 mg total) by mouth daily. 04/27/21 04/27/22  Larey Dresser, MD  glucose blood (ONE TOUCH ULTRA TEST) test strip Use as instructed 12/31/16   Charlott Rakes, MD  hydrOXYzine (ATARAX/VISTARIL) 25 MG tablet Take 1 tablet (25 mg total) by mouth 3 (three) times daily as needed for anxiety. 05/16/21   Charlott Rakes, MD  ibuprofen (ADVIL) 400 MG tablet Take 1 tablet (400 mg total) by mouth every 6 (six) hours as needed for headache or moderate pain. 01/03/21   Katsadouros, Vasilios, MD  isosorbide-hydrALAZINE (BIDIL) 20-37.5 MG tablet Take 1 tablet by mouth 3 (three) times daily. 04/27/21   Larey Dresser, MD  sacubitril-valsartan (ENTRESTO) 49-51 MG Take 1 tablet by mouth 2 (two) times daily. 02/06/21   Ladell Pier, MD  spironolactone (ALDACTONE) 25 MG tablet TAKE 1 TABLET BY MOUTH ONCE DAILY. 06/15/21 06/15/22  Lyda Jester M, PA-C  timolol (TIMOPTIC) 0.5 % ophthalmic solution Place 1 drop into both eyes daily after breakfast. 06/21/20   [provider]  lovastatin (MEVACOR) 20 MG tablet Take 2 tablets (40 mg total) by mouth at bedtime. 06/24/14 08/26/14  Lorayne Marek, MD    Allergies    Patient has no known allergies.  Review of Systems   Review of Systems  Constitutional:  Positive for fatigue and fever.  Respiratory:  Positive for cough and shortness of breath.   Cardiovascular:  Positive for chest pain. Negative for leg swelling.  Gastrointestinal:  Positive for vomiting.  All other systems reviewed and are negative.  Physical Exam Updated Vital Signs BP (!) 161/97   Pulse 61   Temp 98.1 F (36.7 C)   Resp 18   Ht 1.778 m ('5\' 10"'$ )   Wt 85.7 kg   SpO2 100%   BMI 27.12 kg/m    Physical Exam CONSTITUTIONAL: Patient appears chronically ill HEAD: Normocephalic/atraumatic EYES: EOMI/PERRL ENMT: Mucous membranes moist NECK: supple no meningeal signs, no JVD SPINE/BACK:entire spine nontender CV: S1/S2 noted, no loud harsh murmurs LUNGS: Lungs are clear to auscultation bilaterally, no apparent distress ABDOMEN: soft, nontender, no rebound or guarding, bowel sounds noted throughout abdomen GU:no cva tenderness NEURO: Pt is awake/alert/appropriate, moves all extremitiesx4.  No facial droop.   EXTREMITIES: pulses normal/equal, full ROM, no lower extremity edema or tenderness SKIN: warm, color normal PSYCH: no abnormalities of mood noted, alert and oriented to situation  ED Results / Procedures / Treatments   Labs (all labs ordered are listed, but only abnormal results are displayed) Labs Reviewed  RESP PANEL BY RT-PCR (FLU A&B, COVID)  ARPGX2 - Abnormal; Notable for the following components:      Result Value   SARS Coronavirus 2 by RT PCR POSITIVE (*)    All other components within normal limits  BASIC METABOLIC PANEL - Abnormal; Notable for the following components:   CO2 21 (*)    Creatinine, Ser 1.25 (*)    All other components within normal limits  CBC  BRAIN NATRIURETIC PEPTIDE  TROPONIN I (HIGH SENSITIVITY)    EKG EKG Interpretation  Date/Time:  Friday July 28 2021 02:39:35 EDT Ventricular Rate:  54 PR Interval:    QRS Duration: 121 QT Interval:  496 QTC Calculation: 471 R Axis:   77 Text Interpretation: Atrial fibrillation Probable left ventricular hypertrophy Nonspecific T abnormalities, inferior leads Confirmed by Ripley Fraise [phone]) on 07/28/2021 3:00:53 AM  Radiology DG Chest Portable 1 View  Result Date: 07/28/2021 CLINICAL DATA:  Shortness of breath EXAM: PORTABLE CHEST 1 VIEW COMPARISON:  05/01/2021 FINDINGS: Lungs are clear.  No pleural effusion or pneumothorax. The heart is normal in size. IMPRESSION: No evidence of acute  cardiopulmonary disease. Electronically Signed   By: Julian Hy M.D.   On: 07/28/2021 03:21    Procedures Procedures   Medications Ordered in ED Medications  hydrALAZINE (APRESOLINE) tablet 50 mg (50 mg Oral Given 07/28/21 0416)    ED Course  I have reviewed the triage vital signs and the nursing notes.  Pertinent labs & imaging results that were available during my care of the patient were reviewed by me and considered in my medical decision making (see chart for details).    MDM Rules/Calculators/A&P                           Patient presents with law enforcement for chest pain or shortness of breath.  Per police, patient had just been arrested for cocaine trafficking.  Soon after patient started reporting chest pain then shortness of breath. Imaging and labs are pending at this time   Overall work-up is unremarkable.  Patient is in no acute distress.  No hypoxia.  Patient given dose of his home medications, with some improvement in his blood pressure. No signs of acute CHF.  No signs of pneumonia on x-ray. Patient was found positive for COVID-19 which likely caused his cough Patient be discharged to law enforcement, and will recommend he start his home medication Final Clinical Impression(s) / ED Diagnoses Final diagnoses:  Precordial pain  Primary hypertension  Shortness of breath    Rx / DC Orders ED Discharge Orders     None        Ripley Fraise, MD 07/28/21 0501

## 2021-08-01 ENCOUNTER — Encounter (HOSPITAL_COMMUNITY): Payer: Medicaid Other

## 2021-08-02 ENCOUNTER — Telehealth (HOSPITAL_COMMUNITY): Payer: Self-pay

## 2021-08-02 NOTE — Telephone Encounter (Signed)
Attempted to reach [name] in reference to paramedicine visits without success. We have not had a successful visit since 6/30. HF Clinic PA [place] approved discharge due to patients lack of communication back to me and having multiple no shows. Patient will be discharged from paramedicine effective immediately.   Patient is now discharged from [name].  Patient has/has not met the following goals:  Yes :Patient expresses basic understanding of medications and what they are for Yes :Patient able to verbalize heart failure specific dietary/fluid restrictions Yes :Patient is aware of who to call if they have medical concerns or if they need to schedule or change appts Yes :Patient has a scale for daily weights and weighs regularly Yes :Patient able to verbalize concerning symptoms when they should call the HF clinic (weight gain ranges, etc) Yes :Patient has a PCP and has seen within the past year or has upcoming appt Yes :Patient has reliable access to getting their medications No :Patient has shown they are able to reorder medications reliably No :Patient has had admission in past 30 days- if yes how many? No :Patient has had admission in past 90 days- if yes how many?  Discharge Comments:  Patient knows how to re-order medications if needed and can use [facility] to pick them up as he has done in the past but elects not to.

## 2021-08-18 ENCOUNTER — Other Ambulatory Visit: Payer: Self-pay | Admitting: Family Medicine

## 2021-08-18 ENCOUNTER — Other Ambulatory Visit: Payer: Self-pay

## 2021-08-18 NOTE — Telephone Encounter (Signed)
Patient no-showed twice in July and does not have a future appointment scheduled.

## 2021-08-18 NOTE — Telephone Encounter (Signed)
Requested medications are due for refill today yes  Requested medications are on the active medication list yes  Last refill 06/15/21  Last visit 03/2021  Future visit scheduled Asked to return in July but did not, no upcoming appt scheduled.  Notes to clinic Please assess.

## 2021-08-22 ENCOUNTER — Other Ambulatory Visit: Payer: Self-pay

## 2021-08-22 MED ORDER — HYDROXYZINE HCL 25 MG PO TABS
25.0000 mg | ORAL_TABLET | Freq: Three times a day (TID) | ORAL | 1 refills | Status: DC | PRN
Start: 2021-08-22 — End: 2021-11-01
  Filled 2021-08-22: qty 60, 20d supply, fill #0

## 2021-08-25 ENCOUNTER — Other Ambulatory Visit: Payer: Self-pay

## 2021-08-29 ENCOUNTER — Other Ambulatory Visit: Payer: Self-pay

## 2021-09-15 ENCOUNTER — Other Ambulatory Visit (HOSPITAL_COMMUNITY): Payer: Self-pay

## 2021-09-15 ENCOUNTER — Ambulatory Visit (HOSPITAL_COMMUNITY)
Admission: RE | Admit: 2021-09-15 | Discharge: 2021-09-15 | Disposition: A | Discharge status: Home / Self Care | Payer: Self-pay | Source: Ambulatory Visit | Attending: Cardiology | Admitting: Cardiology

## 2021-09-15 ENCOUNTER — Other Ambulatory Visit: Payer: Self-pay

## 2021-09-15 VITALS — BP 146/74 | HR 57 | Ht 70.0 in | Wt 188.0 lb

## 2021-09-15 DIAGNOSIS — E785 Hyperlipidemia, unspecified: Secondary | ICD-10-CM | POA: Insufficient documentation

## 2021-09-15 DIAGNOSIS — I428 Other cardiomyopathies: Secondary | ICD-10-CM | POA: Insufficient documentation

## 2021-09-15 DIAGNOSIS — R0789 Other chest pain: Secondary | ICD-10-CM | POA: Insufficient documentation

## 2021-09-15 DIAGNOSIS — G4733 Obstructive sleep apnea (adult) (pediatric): Secondary | ICD-10-CM | POA: Insufficient documentation

## 2021-09-15 DIAGNOSIS — E1122 Type 2 diabetes mellitus with diabetic chronic kidney disease: Secondary | ICD-10-CM | POA: Insufficient documentation

## 2021-09-15 DIAGNOSIS — F1410 Cocaine abuse, uncomplicated: Secondary | ICD-10-CM | POA: Insufficient documentation

## 2021-09-15 DIAGNOSIS — R0600 Dyspnea, unspecified: Secondary | ICD-10-CM | POA: Insufficient documentation

## 2021-09-15 DIAGNOSIS — Z87891 Personal history of nicotine dependence: Secondary | ICD-10-CM | POA: Insufficient documentation

## 2021-09-15 DIAGNOSIS — N1831 Chronic kidney disease, stage 3a: Secondary | ICD-10-CM | POA: Insufficient documentation

## 2021-09-15 DIAGNOSIS — Z7901 Long term (current) use of anticoagulants: Secondary | ICD-10-CM | POA: Insufficient documentation

## 2021-09-15 DIAGNOSIS — Z79899 Other long term (current) drug therapy: Secondary | ICD-10-CM | POA: Insufficient documentation

## 2021-09-15 DIAGNOSIS — I5082 Biventricular heart failure: Secondary | ICD-10-CM | POA: Insufficient documentation

## 2021-09-15 DIAGNOSIS — I5042 Chronic combined systolic (congestive) and diastolic (congestive) heart failure: Secondary | ICD-10-CM | POA: Insufficient documentation

## 2021-09-15 DIAGNOSIS — Z8249 Family history of ischemic heart disease and other diseases of the circulatory system: Secondary | ICD-10-CM | POA: Insufficient documentation

## 2021-09-15 DIAGNOSIS — I130 Hypertensive heart and chronic kidney disease with heart failure and stage 1 through stage 4 chronic kidney disease, or unspecified chronic kidney disease: Secondary | ICD-10-CM | POA: Insufficient documentation

## 2021-09-15 DIAGNOSIS — I5023 Acute on chronic systolic (congestive) heart failure: Secondary | ICD-10-CM

## 2021-09-15 DIAGNOSIS — R0602 Shortness of breath: Secondary | ICD-10-CM | POA: Insufficient documentation

## 2021-09-15 DIAGNOSIS — Z8673 Personal history of transient ischemic attack (TIA), and cerebral infarction without residual deficits: Secondary | ICD-10-CM | POA: Insufficient documentation

## 2021-09-15 DIAGNOSIS — Z7982 Long term (current) use of aspirin: Secondary | ICD-10-CM | POA: Insufficient documentation

## 2021-09-15 DIAGNOSIS — F32A Depression, unspecified: Secondary | ICD-10-CM | POA: Insufficient documentation

## 2021-09-15 DIAGNOSIS — Z9114 Patient's other noncompliance with medication regimen: Secondary | ICD-10-CM | POA: Insufficient documentation

## 2021-09-15 LAB — BASIC METABOLIC PANEL
Anion gap: 6 (ref 5–15)
BUN: 16 mg/dL (ref 6–20)
CO2: 24 mmol/L (ref 22–32)
Calcium: 8.9 mg/dL (ref 8.9–10.3)
Chloride: 108 mmol/L (ref 98–111)
Creatinine, Ser: 1.29 mg/dL — ABNORMAL HIGH (ref 0.61–1.24)
GFR, Estimated: >60 mL/min (ref >60)
Glucose, Bld: 117 mg/dL — ABNORMAL HIGH (ref 70–99)
Potassium: 4.1 mmol/L (ref 3.5–5.1)
Sodium: 138 mmol/L (ref 135–145)

## 2021-09-15 LAB — LIPID PANEL
Cholesterol: 99 mg/dL (ref 0–200)
HDL: 52 mg/dL (ref >40)
LDL Cholesterol: 40 mg/dL (ref 0–99)
Total CHOL/HDL Ratio: 1.9 RATIO
Triglycerides: 34 mg/dL (ref <150)
VLDL: 7 mg/dL (ref 0–40)

## 2021-09-15 LAB — BRAIN NATRIURETIC PEPTIDE: B Natriuretic Peptide: 80.4 pg/mL (ref 0.0–100.0)

## 2021-09-15 MED ORDER — POTASSIUM CHLORIDE CRYS ER 20 MEQ PO TBCR
40.0000 meq | EXTENDED_RELEASE_TABLET | Freq: Once | ORAL | Status: AC
  Administered 2021-09-15: 40 meq via ORAL

## 2021-09-15 MED ORDER — SPIRONOLACTONE 25 MG PO TABS
25.0000 mg | ORAL_TABLET | Freq: Every day | ORAL | 3 refills | Status: DC
  Filled 2021-09-15: qty 30, 30d supply, fill #0

## 2021-09-15 MED ORDER — FUROSEMIDE 40 MG PO TABS
40.0000 mg | ORAL_TABLET | Freq: Every day | ORAL | 3 refills | Status: DC
  Filled 2021-09-15: qty 30, 30d supply, fill #0

## 2021-09-15 MED ORDER — EZETIMIBE 10 MG PO TABS
10.0000 mg | ORAL_TABLET | Freq: Every day | ORAL | 3 refills | Status: DC
  Filled 2021-09-15: qty 30, 30d supply, fill #0

## 2021-09-15 MED ORDER — ATORVASTATIN CALCIUM 80 MG PO TABS
ORAL_TABLET | Freq: Every day | ORAL | 3 refills | Status: DC
  Filled 2021-09-15: qty 30, 30d supply, fill #0

## 2021-09-15 MED ORDER — FUROSEMIDE 10 MG/ML IJ SOLN
80.0000 mg | Freq: Once | INTRAMUSCULAR | Status: AC
  Administered 2021-09-15: 80 mg via INTRAVENOUS

## 2021-09-15 NOTE — Progress Notes (Signed)
ReDS Vest / Clip - 09/15/21 1500       ReDS Vest / Clip   Station Marker C    Ruler Value 25    ReDS Value Range High volume overload    ReDS Actual Value 46

## 2021-09-15 NOTE — Patient Instructions (Addendum)
Restart Your Medications  Take Furosemide 40 mg Twice daily FOR 4 DAYS ONLY, then 40 mg Daily  Prescriptions for Furosemide, Spironolactone, Atorvastatin, and Zetia have all been sent to the [facility], they will ship these medications to your home  You should have Bidil and Entresto at you home as you get these medications from the companies, if you do not please call the companies to request a refill at:  For Bidil = call [phone] For Delene Loll = call [phone]  Your physician recommends that you schedule a follow-up appointment in: 1 week  Do the following things EVERYDAY: Weigh yourself in the morning before breakfast. Write it down and keep it in a log. Take your medicines as prescribed Eat low salt foods--Limit salt (sodium) to 2000 mg per day.  Stay as active as you can everyday Limit all fluids for the day to less than 2 liters  If you have any questions or concerns before your next appointment please send [place] a message through [place] or call our office at [phone].    TO LEAVE A MESSAGE FOR THE NURSE SELECT OPTION 2, PLEASE LEAVE A MESSAGE INCLUDING: YOUR NAME DATE OF BIRTH CALL BACK NUMBER REASON FOR CALL**this is important as we prioritize the call backs  YOU WILL RECEIVE A CALL BACK THE SAME DAY AS LONG AS YOU CALL BEFORE 4:00 PM  At the [facility], you and your health needs are our priority. As part of our continuing mission to provide you with exceptional heart care, we have created designated Provider Care Teams. These Care Teams include your primary Cardiologist (physician) and Advanced Practice Providers (APPs- Physician Assistants and Nurse Practitioners) who all work together to provide you with the care you need, when you need it.   You may see any of the following providers on your designated Care Team at your next follow up: Dr Glori Bickers Dr Loralie Champagne Dr Patrice Paradise, NP Lyda Jester, [place] Ginnie Smart Audry Riles, PharmD   Please be sure to bring in all your medications bottles to every appointment.

## 2021-09-15 NOTE — Progress Notes (Signed)
Patient received Lasix 80 mg IV as ordered. Pt voided 350cc clear yellow urine.K lor con 40 meq po given as ordered.

## 2021-09-15 NOTE — Progress Notes (Signed)
Advanced Heart Failure Clinic Note    PCP: Charlott Rakes, MD HF Cardiologist: Dr. Aundra Dubin  HPI: Matthew Petersonis a 55 y.o. with PMH significant for combined systolic CHF with biventricular failure, T2DM, HTN, OSA, tobacco use, HLD, cocaine use, poor medication adherence, depression,  CVA, and CKD 3a.      He was first admitted with heart failure symptoms in 2015 in the setting of long standing uncontrolled hypertension and smoking.  Had a LHC with normal cors.    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  Was not followed by cardiology or hospitalized for CHF during this time period.     March 2021 he began having CHF exacerbations after running out of meds between admissions.  Left AMA in March. Another similar admission in July, he was out of his medications at that time as well.     Most recently he was admitted 12/2020 with CHF exacerbation after running out of his medications.  He was diuresed, put on an excellent medication regimen. He also endorsed suicidal ideation at that time and was subsequently admitted to behavioral health. UDS was positive for cocaine, echo showed EF < 20% with moderate RV dysfunction.   He was seen in the HF clinic 02/08/21 and lasix was cut back to 20 mg daily, later Lasix was stopped.   Echo was done in 5/22 with EF improved to 50%.   Last clinic visit in June, Farxiga was stopped due to dizziness.  He now presents to clinic w/ complaints of increasing dyspnea and chest pressures. Ran out of all of his meds, including lasix ~3 months ago. ReDs Clip elevated at 46%. BP 146/74. EKG shows sinus brady, 51 bpm, slight inferior WT abnormalities (unchanged from prior) and LVH.   ECG (personally reviewed): SB, 51 bpm , LVH, inferolateral ST changes  REDS clip 46%  Labs (5/22): K 3.9, creatinine 1.6 Labs (6/22): K 3.8, creatinine 1.49 Labs (8/22): K 4.6 creatinine 1.25  PMH: 1.  HTN 2. Type 2 diabetes 3. H/o cocaine abuse: Positive UDS 1/22.  4. CVA 2018 5. OSA: Uses CPAP.  6. Chronic systolic CHF: Nonischemic cardiomyopathy.  - LHC (2015): Normal coronaries.  - Echo (2021): EF 40-45%. - Echo (1/22): EF <20%, moderate LV dilation, moderately decreased RV systolic function, severe LAE.  - Echo (5/22): EF 50%, moderate LV dilation, normal RV.  7. Depression 8. CKD stage 3 9. Hyperlipidemia  Current Outpatient Medications  Medication Sig Dispense Refill   aspirin EC 81 MG tablet Take 1 tablet (81 mg total) by mouth daily. Swallow whole. 30 tablet 11   atorvastatin (LIPITOR) 80 MG tablet TAKE 1 TABLET BY MOUTH DAILY. 30 tablet 3   carvedilol (COREG) 25 MG tablet TAKE 1 TABLET (25 MG TOTAL) BY MOUTH 2 (TWO) TIMES DAILY. 60 tablet 3   ezetimibe (ZETIA) 10 MG tablet Take 1 tablet (10 mg total) by mouth daily. 90 tablet 3   FLUoxetine (PROZAC) 40 MG capsule TAKE 1 CAPSULE (40 MG TOTAL) BY MOUTH DAILY. 30 capsule 6   furosemide (LASIX) 20 MG tablet Take 1 tablet (20 mg total) by mouth daily. 30 tablet 11   glucose blood (ONE TOUCH ULTRA TEST) test strip Use as instructed 30 each 12   hydrOXYzine (ATARAX/VISTARIL) 25 MG tablet Take 1 tablet (25 mg total) by mouth 3 (three) times daily as needed for anxiety. 60 tablet 1   ibuprofen (ADVIL) 400 MG tablet  Take 1 tablet (400 mg total) by mouth every 6 (six) hours as needed for headache or moderate pain. 30 tablet 0   isosorbide-hydrALAZINE (BIDIL) 20-37.5 MG tablet Take 1 tablet by mouth 3 (three) times daily. 90 tablet 11   spironolactone (ALDACTONE) 25 MG tablet TAKE 1 TABLET BY MOUTH ONCE DAILY. 30 tablet 6   timolol (TIMOPTIC) 0.5 % ophthalmic solution Place 1 drop into both eyes daily after breakfast.     sacubitril-valsartan (ENTRESTO) 49-51 MG Take 1 tablet by mouth 2 (two) times daily. 60 tablet 1   No current facility-administered medications for this encounter.   No Known Allergies  Social History    Socioeconomic History   Marital status: Widowed    Spouse name: Not on file   Number of children: 2   Years of education: 14   Highest education level: Not on file  Occupational History   Occupation: [employer]  Tobacco Use   Smoking status: Former    Years: 20.00    Types: Cigarettes   Smokeless tobacco: Former    Quit date: 11/14/2020  Vaping Use   Vaping Use: Never used  Substance and Sexual Activity   Alcohol use: No   Drug use: No   Sexual activity: Yes  Other Topics Concern   Not on file  Social History Narrative   Lives with 2 young children in [place].  Wife died in 05-18-20.  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   Left-handed   Caffeine: occasional tea   Social Determinants of Radio broadcast assistant Strain: High Risk   Difficulty of Paying Living Expenses: Very hard  Food Insecurity: Landscape architect Present   Worried About Charity fundraiser in the Last Year: Sometimes true   Arboriculturist in the Last Year: Sometimes true  Transportation Needs: Unmet Transportation Needs   Lack of Transportation (Medical): Yes   Lack of Transportation (Non-Medical): Yes  Physical Activity: Not on file  Stress: Stress Concern Present   Feeling of Stress : Very much  Social Connections: Not on file  Intimate Partner Violence: Not on file   Family History  Problem Relation Age of Onset   Heart attack Father        died @ 53   Cervical cancer Mother        died @ 31   Vitals:   October 01, 2021 1419  BP: (!) 146/74  Pulse: (!) 57  SpO2: 99%  Weight: 85.3 kg (188 lb)  Height: 5\' 10"  (1.778 m)   Wt Readings from Last 3 Encounters:  October 01, 2021 85.3 kg (188 lb)  07/28/21 85.7 kg (189 lb)  06/15/21 85.7 kg (189 lb)   PHYSICAL EXAM: ReDs Clip 46%  General:  fatigued appearing. Dyspneic w/ conversation  HEENT: normal Neck: supple. JVD elevated to jaw. Carotids 2+ bilat; no bruits. No lymphadenopathy or thyromegaly appreciated. Cor: PMI  nondisplaced. Regular rhythm, slow rate. +S3  Lungs: decreased BS at the base bilaterally  Abdomen: soft, nontender, nondistended. No hepatosplenomegaly. No bruits or masses. Good bowel sounds. Extremities: no cyanosis, clubbing, rash, edema Neuro: alert & oriented x 3, cranial nerves grossly intact. moves all 4 extremities w/o difficulty. Affect pleasant.   ASSESSMENT & PLAN: 1. A/C CHF, HFrEF>>>HFimEF: Nonischemic cardiomyopathy.  Due to poorly controlled HTN +/- cocaine abuse.  Normal coronaries on 2015 cath.  Echo in 1/22 showed EF < 20% with moderate RV dysfunction (off meds, +cocaine).  Echo in  5/22 on meds and off cocaine with EF up to 50%.  Now presenting w/ NYHA Class IIIb symptoms and fluid overload in setting of poor med compliance. Ran out of all meds including diuretics 3 months ago. ReDs Clip 46%  - Give 80 mg IV Lasix in clinic today w/ 40 mEq of KCL  - Restart PO Lasix, increase to 40 mg bid x 4 days then reduce to 40 mg daily  - Did not tolerate Farxiga due to dizziness  - Restart Entresto 49-51mg  bid.  - Restart spiro 25 mg daily. - Restart Bidil 1 tab tid (recently decreased with orthostatic symptoms). - Hold off on restarting Coreg given acute decompensated HF and bradycardia  - EF is out of ICD range.  2. OSA: Continue CPAP.  3. Tobacco Use Disorder: Stopped smoking in November 2021. - Given dyspnea, arranged PFTs to assess for COPD. This has not been completed yet 4. Cocaine Use Disorder:  UDS + on 12/2020. Has cut back and EF improved on echo.  - Encouraged abstinence.  5. CKD 3: BMET today and again in 7 days  6. Hyperlipidemia:  - Continue atorvastatin  7. Depression: - Continue Prozac.  - Has been referred to Psychiatry per PCP. 8. T2DM: management per PCP - Did not tolerate Farxiga due to dizziness 9. 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 10. Social: H/o poor compliance. Recent discharged from  Paramedicine due to lack of communication   F/u w/ APP in 1 week   Lyda Jester, PA-C 09/15/21  Patient seen with PA, agree with the above note.   Though most recent echo showed improved EF to 50%, he is significantly short of breath today.  REDS clip 46%.  He is short of breath at rest.  He has been out of all his meds for at least 2 weeks and maybe longer.  Unclear why he did not get his refills.   General: NAD, mild tachypnea.  Neck: JVP 14 cm, no thyromegaly or thyroid nodule.  Lungs: Clear to auscultation bilaterally with normal respiratory effort. CV: Nondisplaced PMI.  Heart regular S1/S2, no S3/S4, no murmur.  No edema.  No carotid bruit.  Normal pedal pulses.  Abdomen: Soft, nontender, no hepatosplenomegaly, no distention.  Skin: Intact without lesions or rashes.  Neurologic: Alert and oriented x 3.  Psych: Normal affect. Extremities: No clubbing or cyanosis.  HEENT: Normal.   We are going to restart his Bidil 1 tab tid, Entresto 49/51 bid, and spironolactone 25 daily.  Will hold off on start Coreg with significant volume overload and sinus bradycardia in 50s today.  Can restart lower dose when volume is better controlled.   We will give him Lasix 80 mg IV x 1 in the office today.  He will then start Lasix 40 mg po bid x 4 days then 40 mg daily after that.  Check BMET/BNP today.  He will need followup with APP next week and repeat BMET at that time.    If he does not feel better with IV Lasix, may need to go to the ER.   Loralie Champagne 09/15/2021 6:42 PM

## 2021-09-20 ENCOUNTER — Telehealth (HOSPITAL_COMMUNITY): Payer: Self-pay

## 2021-09-20 NOTE — Telephone Encounter (Signed)
Called to confirm/remind patient of their appointment at the [facility] on 09/21/21.   Patient reminded to bring all medications and/or complete list.  Confirmed patient has transportation. Gave directions, instructed to utilize [facility] parking.  Confirmed appointment prior to ending call.

## 2021-09-21 ENCOUNTER — Encounter (HOSPITAL_COMMUNITY): Payer: Medicaid Other

## 2021-09-29 ENCOUNTER — Telehealth (HOSPITAL_COMMUNITY): Payer: Self-pay | Admitting: Licensed Clinical Social Worker

## 2021-09-29 NOTE — Telephone Encounter (Signed)
CSW received call from pt to provide clinic with his new phone number- number updated in chart.  Pt then asked for information about [facility] provided with their number/address/walk in hour information  Pt also inquired about getting help with his anxiety- had been set up with [facility] but has not been going there recently- interested in other options.  CSW explained [facility] and [facility] as alternative options for self pay individuals- provided pt with their phone numbers to call and make an appt or complete walk in assessment.  Pt no-showed to last appt but is interested in getting reestablished- CSW provided with clinic phone number to call and make new appt  No further concerns expressed at this time- CSW will continue to follow and assist as needed  Jorge Ny, [facility] Desk#: [phone] Cell#: [phone]

## 2021-10-16 ENCOUNTER — Telehealth (HOSPITAL_COMMUNITY): Payer: Self-pay

## 2021-10-16 NOTE — Telephone Encounter (Signed)
Called and was unable to leave a voice message to confirm/remind patient of their appointment at the [facility] on 10/17/21.

## 2021-10-17 ENCOUNTER — Ambulatory Visit (HOSPITAL_COMMUNITY)
Admission: RE | Admit: 2021-10-17 | Discharge: 2021-10-17 | Disposition: A | Discharge status: Home / Self Care | Payer: Self-pay | Source: Ambulatory Visit | Attending: Adult Health | Admitting: Adult Health

## 2021-10-17 ENCOUNTER — Other Ambulatory Visit: Payer: Self-pay

## 2021-10-17 ENCOUNTER — Encounter (HOSPITAL_COMMUNITY): Payer: Self-pay

## 2021-10-17 VITALS — BP 90/64 | HR 82 | Wt 184.6 lb

## 2021-10-17 DIAGNOSIS — F1410 Cocaine abuse, uncomplicated: Secondary | ICD-10-CM | POA: Insufficient documentation

## 2021-10-17 DIAGNOSIS — I160 Hypertensive urgency: Secondary | ICD-10-CM

## 2021-10-17 DIAGNOSIS — E782 Mixed hyperlipidemia: Secondary | ICD-10-CM

## 2021-10-17 DIAGNOSIS — I130 Hypertensive heart and chronic kidney disease with heart failure and stage 1 through stage 4 chronic kidney disease, or unspecified chronic kidney disease: Secondary | ICD-10-CM | POA: Insufficient documentation

## 2021-10-17 DIAGNOSIS — I428 Other cardiomyopathies: Secondary | ICD-10-CM | POA: Insufficient documentation

## 2021-10-17 DIAGNOSIS — J9601 Acute respiratory failure with hypoxia: Secondary | ICD-10-CM

## 2021-10-17 DIAGNOSIS — G4733 Obstructive sleep apnea (adult) (pediatric): Secondary | ICD-10-CM

## 2021-10-17 DIAGNOSIS — I5042 Chronic combined systolic (congestive) and diastolic (congestive) heart failure: Secondary | ICD-10-CM

## 2021-10-17 DIAGNOSIS — F32A Depression, unspecified: Secondary | ICD-10-CM | POA: Insufficient documentation

## 2021-10-17 DIAGNOSIS — Z79899 Other long term (current) drug therapy: Secondary | ICD-10-CM | POA: Insufficient documentation

## 2021-10-17 DIAGNOSIS — Z139 Encounter for screening, unspecified: Secondary | ICD-10-CM

## 2021-10-17 DIAGNOSIS — Z8249 Family history of ischemic heart disease and other diseases of the circulatory system: Secondary | ICD-10-CM | POA: Insufficient documentation

## 2021-10-17 DIAGNOSIS — N1831 Chronic kidney disease, stage 3a: Secondary | ICD-10-CM | POA: Insufficient documentation

## 2021-10-17 DIAGNOSIS — R0609 Other forms of dyspnea: Secondary | ICD-10-CM | POA: Insufficient documentation

## 2021-10-17 DIAGNOSIS — I5082 Biventricular heart failure: Secondary | ICD-10-CM | POA: Insufficient documentation

## 2021-10-17 DIAGNOSIS — F332 Major depressive disorder, recurrent severe without psychotic features: Secondary | ICD-10-CM

## 2021-10-17 DIAGNOSIS — Z87891 Personal history of nicotine dependence: Secondary | ICD-10-CM | POA: Insufficient documentation

## 2021-10-17 DIAGNOSIS — H6121 Impacted cerumen, right ear: Secondary | ICD-10-CM

## 2021-10-17 DIAGNOSIS — E785 Hyperlipidemia, unspecified: Secondary | ICD-10-CM | POA: Insufficient documentation

## 2021-10-17 DIAGNOSIS — Z8673 Personal history of transient ischemic attack (TIA), and cerebral infarction without residual deficits: Secondary | ICD-10-CM

## 2021-10-17 DIAGNOSIS — F17200 Nicotine dependence, unspecified, uncomplicated: Secondary | ICD-10-CM

## 2021-10-17 DIAGNOSIS — F0631 Mood disorder due to known physiological condition with depressive features: Secondary | ICD-10-CM

## 2021-10-17 DIAGNOSIS — N182 Chronic kidney disease, stage 2 (mild): Secondary | ICD-10-CM

## 2021-10-17 DIAGNOSIS — R4701 Aphasia: Secondary | ICD-10-CM

## 2021-10-17 DIAGNOSIS — E1122 Type 2 diabetes mellitus with diabetic chronic kidney disease: Secondary | ICD-10-CM | POA: Insufficient documentation

## 2021-10-17 DIAGNOSIS — Z7982 Long term (current) use of aspirin: Secondary | ICD-10-CM | POA: Insufficient documentation

## 2021-10-17 DIAGNOSIS — I5043 Acute on chronic combined systolic (congestive) and diastolic (congestive) heart failure: Secondary | ICD-10-CM

## 2021-10-17 LAB — BASIC METABOLIC PANEL
Anion gap: 5 (ref 5–15)
BUN: 15 mg/dL (ref 6–20)
CO2: 24 mmol/L (ref 22–32)
Calcium: 9 mg/dL (ref 8.9–10.3)
Chloride: 107 mmol/L (ref 98–111)
Creatinine, Ser: 1.91 mg/dL — ABNORMAL HIGH (ref 0.61–1.24)
GFR, Estimated: 41 mL/min — ABNORMAL LOW (ref >60)
Glucose, Bld: 151 mg/dL — ABNORMAL HIGH (ref 70–99)
Potassium: 3.7 mmol/L (ref 3.5–5.1)
Sodium: 136 mmol/L (ref 135–145)

## 2021-10-17 LAB — BRAIN NATRIURETIC PEPTIDE: B Natriuretic Peptide: 71 pg/mL (ref 0.0–100.0)

## 2021-10-17 MED ORDER — ISOSORB DINITRATE-HYDRALAZINE 20-37.5 MG PO TABS
0.5000 | ORAL_TABLET | Freq: Three times a day (TID) | ORAL | 11 refills | Status: DC
  Filled 2021-10-17: qty 45, 30d supply, fill #0

## 2021-10-17 NOTE — Addendum Note (Signed)
Encounter addended by: Kerry Dory, CMA on: 10/17/2021 3:23 PM  Actions taken: Visit diagnoses modified, Order list changed, Diagnosis association updated

## 2021-10-17 NOTE — Progress Notes (Signed)
Advanced Heart Failure Clinic Note    PCP: Charlott Rakes, MD HF Cardiologist: Dr. Aundra Dubin  Reason for Visit: F/u for chronic systolic heart failure   HPI: Matthew [name].is a 55 y.o. with PMH significant for combined systolic CHF with biventricular failure, T2DM, HTN, OSA, tobacco use, HLD, cocaine use, poor medication adherence, depression,  CVA, and CKD 3a.      He was first admitted with heart failure symptoms in 2015 in the setting of long standing uncontrolled hypertension and smoking.  Had a LHC with normal cors.    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  Was not followed by cardiology or hospitalized for CHF during this time period.     March 2021 he began having CHF exacerbations after running out of meds between admissions.  Left AMA in March. Another similar admission in July, he was out of his medications at that time as well.     Most recently he was admitted 12/2020 with CHF exacerbation after running out of his medications.  He was diuresed, put on an excellent medication regimen. He also endorsed suicidal ideation at that time and was subsequently admitted to behavioral health. UDS was positive for cocaine, echo showed EF < 20% with moderate RV dysfunction.   He was seen in the HF clinic 02/08/21 and lasix was cut back to 20 mg daily, later Lasix was stopped.   Echo was done in 5/22 with EF improved to 50%.   Last clinic visit in June, Farxiga was stopped due to dizziness.  Seen 09/15/21 and was fluid overloaded and SOB. Had ran out of all of his meds, including lasix ~3 months prior. ReDs Clip elevated at 46%. BP 146/74. EKG showed brady, 51 bpm, slight inferior WT abnormalities (unchanged from prior) and LVH. He was given Lasix 80 mg IV x 1 in the office, then instructed to restart Lasix 40 mg po bid x 4 days then 40 mg daily thereafter. Entresto 49-51 was restarted + 25 of spiro and 1 tablet Bidil  TID. Instructed to f/u in 1 week for reassessment but no showed to the appt.   Now presents back for f/u.  ReDs clip still reading high at 45% but wt is down 4 lb from previous visit. Interestingly, BNP was also normal previous visit, at 80.4 (ReDs clip at that time was reading 46%).  He still complains of exertional dyspnea, NYHA Class II symptoms, but not grossly fluid overloaded on exam. Reports improved med compliance. BP low today, 90/64. Complains of fatigue. HR in the 80s. No dizziness. No syncope/near syncope.    ECG: not performed  REDS clip 45% Doubt accurate   Labs (5/22): K 3.9, creatinine 1.6 Labs (6/22): K 3.8, creatinine 1.49 Labs (8/22): K 4.6 creatinine 1.25 Labs (9/22): K 4.1, creatinine 1.29, LDL 40    PMH: 1. HTN 2. Type 2 diabetes 3. H/o cocaine abuse: Positive UDS 1/22.  4. CVA 2018 5. OSA: Uses CPAP.  6. Chronic systolic CHF: Nonischemic cardiomyopathy.  - LHC (2015): Normal coronaries.  - Echo (2021): EF 40-45%. - Echo (1/22): EF <20%, moderate LV dilation, moderately decreased RV systolic function, severe LAE.  - Echo (5/22): EF 50%, moderate LV dilation, normal RV.  7. Depression 8. CKD stage 3 9. Hyperlipidemia  Current Outpatient Medications  Medication Sig Dispense Refill   aspirin EC 81 MG tablet Take 1 tablet (81 mg total) by mouth daily. Swallow whole.  30 tablet 11   atorvastatin (LIPITOR) 80 MG tablet TAKE 1 TABLET BY MOUTH DAILY. 30 tablet 3   ezetimibe (ZETIA) 10 MG tablet Take 1 tablet (10 mg total) by mouth daily. 30 tablet 3   furosemide (LASIX) 40 MG tablet Take 1 tablet (40 mg total) by mouth daily. 30 tablet 3   glucose blood (ONE TOUCH ULTRA TEST) test strip Use as instructed 30 each 12   hydrOXYzine (ATARAX/VISTARIL) 25 MG tablet Take 1 tablet (25 mg total) by mouth 3 (three) times daily as needed for anxiety. 60 tablet 1   ibuprofen (ADVIL) 400 MG tablet Take 1 tablet (400 mg total) by mouth every 6 (six) hours as needed for headache or  moderate pain. 30 tablet 0   isosorbide-hydrALAZINE (BIDIL) 20-37.5 MG tablet Take 1 tablet by mouth 3 (three) times daily. 90 tablet 11   sacubitril-valsartan (ENTRESTO) 49-51 MG Take 1 tablet by mouth 2 (two) times daily. 60 tablet 1   spironolactone (ALDACTONE) 25 MG tablet TAKE 1 TABLET BY MOUTH ONCE DAILY. 30 tablet 3   timolol (TIMOPTIC) 0.5 % ophthalmic solution Place 1 drop into both eyes daily after breakfast.     FLUoxetine (PROZAC) 40 MG capsule Take 40 mg by mouth daily.     No current facility-administered medications for this encounter.   No Known Allergies  Social History   Socioeconomic History   Marital status: Widowed    Spouse name: Not on file   Number of children: 2   Years of education: 14   Highest education level: Not on file  Occupational History   Occupation: [employer]  Tobacco Use   Smoking status: Former    Years: 20.00    Types: Cigarettes   Smokeless tobacco: Former    Quit date: 11/14/2020  Vaping Use   Vaping Use: Never used  Substance and Sexual Activity   Alcohol use: No   Drug use: No   Sexual activity: Yes  Other Topics Concern   Not on file  Social History Narrative   Lives with 2 young children in [place].  Wife died in 05-28-2020.  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   Left-handed   Caffeine: occasional tea   Social Determinants of Radio broadcast assistant Strain: High Risk   Difficulty of Paying Living Expenses: Very hard  Food Insecurity: Landscape architect Present   Worried About Charity fundraiser in the Last Year: Sometimes true   Arboriculturist in the Last Year: Sometimes true  Transportation Needs: Unmet Transportation Needs   Lack of Transportation (Medical): Yes   Lack of Transportation (Non-Medical): Yes  Physical Activity: Not on file  Stress: Stress Concern Present   Feeling of Stress : Very much  Social Connections: Not on file  Intimate Partner Violence: Not on file   Family  History  Problem Relation Age of Onset   Heart attack Father        died @ 55   Cervical cancer Mother        died @ 46   Vitals:   11-12-21 1436  BP: 90/64  Pulse: 82  SpO2: 99%  Weight: 83.7 kg (184 lb 9.6 oz)    Wt Readings from Last 3 Encounters:  11-12-2021 83.7 kg (184 lb 9.6 oz)  09/15/21 85.3 kg (188 lb)  07/28/21 85.7 kg (189 lb)   PHYSICAL EXAM: General:  Well appearing. No respiratory difficulty HEENT: normal Neck:  supple. no JVD. Carotids 2+ bilat; no bruits. No lymphadenopathy or thyromegaly appreciated. Cor: PMI nondisplaced. Regular rate & rhythm. No rubs, gallops or murmurs. Lungs: clear Abdomen: soft, nontender, nondistended. No hepatosplenomegaly. No bruits or masses. Good bowel sounds. Extremities: no cyanosis, clubbing, rash, edema Neuro: alert & oriented x 3, cranial nerves grossly intact. moves all 4 extremities w/o difficulty. Affect pleasant.   ASSESSMENT & PLAN: 1. 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  Due to poorly controlled HTN +/- cocaine abuse.  Normal coronaries on 2015 cath.  Echo in 1/22 showed EF < 20% with moderate RV dysfunction (off meds, +cocaine).  Echo in 5/22 on meds and off cocaine with EF up to 50%.  NYHA Class II symptoms. ReDs clip reading is elevated at 45% but ? If accurate. Interestingly, BNP was also normal previous visit, at 80.4 (ReDs clip at that time was reading 46%). He does not appear grossly fluid overloaded on exam. - Recheck BNP today to help better assess volume status - Continue Lasix 40 mg daily for now - Did not tolerate Farxiga due to dizziness  - Continue Entresto 49-51mg  bid. BP too soft for titration  - Continue spiro 25 mg daily. - Reduce Bidil to 1/2 tab tid given soft BP - Continue to hold Coreg w/ soft BP  - EF is out of ICD range.  - If fatigue persists, will plan repeat echo at next visit. If EF back down again, may need RHC.  2. OSA: Continue CPAP.  3. Tobacco Use Disorder: Stopped  smoking in November 2021. - Given dyspnea, arranged PFTs to assess for COPD. This has not been completed yet 4. Cocaine Use Disorder:  UDS + on 12/2020. Has cut back and EF improved on echo.  - Encouraged abstinence.  5. CKD 3: Check BMP today  6. Hyperlipidemia: well controlled. LP 9/22 w/ LDL 40 mg/dL.  - c/w atorvastatin 80 mg daily  7. Depression: - Continue Prozac.  - Has been referred to Psychiatry per PCP. Will need f/u w/ them for refills 8. T2DM: management per PCP - Did not tolerate Farxiga due to dizziness 9. 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 10. Social: H/o poor compliance. Recent discharged from Paramedicine due to lack of communication   F/u w/ PharmD in 3-4 weeks. Instructed to bring all of his meds w/ him to that appt. F/u w/ APP in 8-10 weeks.   Lyda Jester, PA-C 10/17/21

## 2021-10-17 NOTE — Addendum Note (Signed)
Encounter addended by: Louann Liv, LCSW on: 10/17/2021 3:23 PM  Actions taken: Pend clinical note, Clinical Note Signed

## 2021-10-17 NOTE — Progress Notes (Signed)
ReDS Vest / Clip - 10/17/21 1400       ReDS Vest / Clip   Station Marker C    Ruler Value 24    ReDS Value Range High volume overload    ReDS Actual Value 45

## 2021-10-17 NOTE — Patient Instructions (Addendum)
DECREASE Bidil to one half tab three times daily  Labs today We will only contact you if something comes back abnormal or we need to make some changes. Otherwise no news is good news!  Your physician recommends that you schedule a follow-up appointment in: 2-3 weeks with the pharmacy team. BE SURE TO BRING ALL MEDICATION BOTTLES WITH YOU TO THIS APPOINTMENT  Your physician recommends that you schedule a follow-up appointment in: 8-10 weeks  in the Advanced Practitioners (PA/NP) Clinic    Do the following things EVERYDAY: Weigh yourself in the morning before breakfast. Write it down and keep it in a log. Take your medicines as prescribed Eat low salt foods--Limit salt (sodium) to 2000 mg per day.  Stay as active as you can everyday Limit all fluids for the day to less than 2 liters  At the [facility], you and your health needs are our priority. As part of our continuing mission to provide you with exceptional heart care, we have created designated Provider Care Teams. These Care Teams include your primary Cardiologist (physician) and Advanced Practice Providers (APPs- Physician Assistants and Nurse Practitioners) who all work together to provide you with the care you need, when you need it.   You may see any of the following providers on your designated Care Team at your next follow up: Dr Glori Bickers Dr Haynes Kerns, NP Lyda Jester, [place] [facility] [place], [place] Audry Riles, PharmD   Please be sure to bring in all your medications bottles to every appointment.

## 2021-10-17 NOTE — Progress Notes (Signed)
CSW referred to assist patient in the clinic today for anxiety. Patient reports he can't get his medication for anxiety and acknowledged that he has not followed up with [facility]. Patient has been referred multiple times but has been unsuccessful with follow up. Patient contacted [facility] with CSW in the room to make appointment for intake. Patient informed that he will receive a call back from intake provider at [facility] [place]. Patient verbalizes understanding and follow up needed for treatment of anxiety. CSW will follow up with Tammy Sours, LCSW tomorrow to inform of today's visit and further follow up as needed. Raquel Sarna, [place], [place]

## 2021-10-19 ENCOUNTER — Telehealth (HOSPITAL_COMMUNITY): Payer: Self-pay | Admitting: *Deleted

## 2021-10-19 MED ORDER — FUROSEMIDE 40 MG PO TABS: 20.0000 mg | ORAL_TABLET | Freq: Every day | ORAL | 3 refills | Status: DC

## 2021-10-19 NOTE — Telephone Encounter (Signed)
Spoke w/pt, he is aware and verbalized understanding

## 2021-10-19 NOTE — Telephone Encounter (Signed)
-----   Message from Consuelo Pandy, [place] sent at 10/19/2021  3:24 PM EDT ----- Bump in Scr. Hold Lasix x 1 day then reduce to 20 mg daily. Repeat BMP and BNP in 1 week

## 2021-10-23 ENCOUNTER — Telehealth: Payer: Self-pay | Admitting: Licensed Clinical Social Worker

## 2021-10-23 NOTE — Telephone Encounter (Signed)
LCSW received a call from [employer] regarding a ride home for pt from [facility]. LCSW shared that I am not familiar w/ pt but per chart review he has worked with my colleagues Environmental consultant and [place]. Gave permission for 1x ride home, pt should utilize [facility] to go to a from appointments moving forward. Jeri Cos, updated via telephone regarding one time approval.   Westley Hummer, MSW, [place]  [phone]

## 2021-10-26 ENCOUNTER — Telehealth (HOSPITAL_COMMUNITY): Payer: Self-pay

## 2021-10-26 NOTE — Telephone Encounter (Signed)
Patient called and says his breathing is not getting better.  Message transferred to [facility].

## 2021-10-26 NOTE — Telephone Encounter (Signed)
Spoke w/pt, he states he was very sob yesterday, it is better today, he said his wt is stable running 176-178 at home, no edema, states only sob with exertion. He was not able to come in for labs this week as scheduled and can not come in today or tomorrow. He states he needs to wait until his appt on Wed 11/16 as he has transportation issues and another appt on Tue. Advised pt to make sure he keeps appt on Wed 11/16, advised he can take Lasix 40 mg (1 tab) today to see if it helps with sob, he is agreeable and will let us know if it doesn't help

## 2021-11-01 ENCOUNTER — Other Ambulatory Visit (HOSPITAL_COMMUNITY): Payer: Self-pay

## 2021-11-01 ENCOUNTER — Ambulatory Visit (HOSPITAL_COMMUNITY)
Admission: RE | Admit: 2021-11-01 | Discharge: 2021-11-01 | Disposition: A | Discharge status: Home / Self Care | Payer: Self-pay | Source: Ambulatory Visit | Attending: Family Medicine | Admitting: Family Medicine

## 2021-11-01 VITALS — BP 160/82 | HR 57 | Wt 188.2 lb

## 2021-11-01 DIAGNOSIS — I130 Hypertensive heart and chronic kidney disease with heart failure and stage 1 through stage 4 chronic kidney disease, or unspecified chronic kidney disease: Secondary | ICD-10-CM | POA: Insufficient documentation

## 2021-11-01 DIAGNOSIS — E785 Hyperlipidemia, unspecified: Secondary | ICD-10-CM | POA: Insufficient documentation

## 2021-11-01 DIAGNOSIS — G4733 Obstructive sleep apnea (adult) (pediatric): Secondary | ICD-10-CM | POA: Insufficient documentation

## 2021-11-01 DIAGNOSIS — Z9989 Dependence on other enabling machines and devices: Secondary | ICD-10-CM | POA: Insufficient documentation

## 2021-11-01 DIAGNOSIS — E1122 Type 2 diabetes mellitus with diabetic chronic kidney disease: Secondary | ICD-10-CM | POA: Insufficient documentation

## 2021-11-01 DIAGNOSIS — F17210 Nicotine dependence, cigarettes, uncomplicated: Secondary | ICD-10-CM | POA: Insufficient documentation

## 2021-11-01 DIAGNOSIS — N1831 Chronic kidney disease, stage 3a: Secondary | ICD-10-CM | POA: Insufficient documentation

## 2021-11-01 DIAGNOSIS — E782 Mixed hyperlipidemia: Secondary | ICD-10-CM

## 2021-11-01 DIAGNOSIS — F1410 Cocaine abuse, uncomplicated: Secondary | ICD-10-CM | POA: Insufficient documentation

## 2021-11-01 DIAGNOSIS — Z9151 Personal history of suicidal behavior: Secondary | ICD-10-CM | POA: Insufficient documentation

## 2021-11-01 DIAGNOSIS — R001 Bradycardia, unspecified: Secondary | ICD-10-CM | POA: Insufficient documentation

## 2021-11-01 DIAGNOSIS — Z79899 Other long term (current) drug therapy: Secondary | ICD-10-CM | POA: Insufficient documentation

## 2021-11-01 DIAGNOSIS — I428 Other cardiomyopathies: Secondary | ICD-10-CM | POA: Insufficient documentation

## 2021-11-01 DIAGNOSIS — Z91199 Patient's noncompliance with other medical treatment and regimen due to unspecified reason: Secondary | ICD-10-CM | POA: Insufficient documentation

## 2021-11-01 DIAGNOSIS — I5082 Biventricular heart failure: Secondary | ICD-10-CM | POA: Insufficient documentation

## 2021-11-01 DIAGNOSIS — F32A Depression, unspecified: Secondary | ICD-10-CM | POA: Insufficient documentation

## 2021-11-01 DIAGNOSIS — Z8673 Personal history of transient ischemic attack (TIA), and cerebral infarction without residual deficits: Secondary | ICD-10-CM | POA: Insufficient documentation

## 2021-11-01 DIAGNOSIS — Z7982 Long term (current) use of aspirin: Secondary | ICD-10-CM | POA: Insufficient documentation

## 2021-11-01 DIAGNOSIS — R42 Dizziness and giddiness: Secondary | ICD-10-CM | POA: Insufficient documentation

## 2021-11-01 DIAGNOSIS — E1121 Type 2 diabetes mellitus with diabetic nephropathy: Secondary | ICD-10-CM

## 2021-11-01 DIAGNOSIS — F0631 Mood disorder due to known physiological condition with depressive features: Secondary | ICD-10-CM

## 2021-11-01 DIAGNOSIS — I5042 Chronic combined systolic (congestive) and diastolic (congestive) heart failure: Secondary | ICD-10-CM | POA: Insufficient documentation

## 2021-11-01 DIAGNOSIS — F17200 Nicotine dependence, unspecified, uncomplicated: Secondary | ICD-10-CM

## 2021-11-01 DIAGNOSIS — N182 Chronic kidney disease, stage 2 (mild): Secondary | ICD-10-CM

## 2021-11-01 DIAGNOSIS — Z9114 Patient's other noncompliance with medication regimen: Secondary | ICD-10-CM | POA: Insufficient documentation

## 2021-11-01 LAB — BASIC METABOLIC PANEL
Anion gap: 7 (ref 5–15)
BUN: 9 mg/dL (ref 6–20)
CO2: 26 mmol/L (ref 22–32)
Calcium: 9 mg/dL (ref 8.9–10.3)
Chloride: 105 mmol/L (ref 98–111)
Creatinine, Ser: 1.38 mg/dL — ABNORMAL HIGH (ref 0.61–1.24)
GFR, Estimated: >60 mL/min (ref >60)
Glucose, Bld: 185 mg/dL — ABNORMAL HIGH (ref 70–99)
Potassium: 3.5 mmol/L (ref 3.5–5.1)
Sodium: 138 mmol/L (ref 135–145)

## 2021-11-01 LAB — BRAIN NATRIURETIC PEPTIDE: B Natriuretic Peptide: 81.3 pg/mL (ref 0.0–100.0)

## 2021-11-01 MED ORDER — HYDROXYZINE HCL 25 MG PO TABS
25.0000 mg | ORAL_TABLET | Freq: Three times a day (TID) | ORAL | 1 refills | Status: DC | PRN
  Filled 2021-11-01: qty 90, 30d supply, fill #0

## 2021-11-01 MED ORDER — ISOSORB DINITRATE-HYDRALAZINE 20-37.5 MG PO TABS
1.0000 | ORAL_TABLET | Freq: Three times a day (TID) | ORAL | 11 refills | Status: DC
  Filled 2021-11-01: qty 90, 30d supply, fill #0

## 2021-11-01 MED ORDER — HYDROXYZINE HCL 25 MG PO TABS
25.0000 mg | ORAL_TABLET | Freq: Three times a day (TID) | ORAL | 0 refills | Status: DC | PRN
Start: 2021-11-01 — End: 2021-12-12
  Filled 2021-11-01: qty 90, 30d supply, fill #0

## 2021-11-01 MED ORDER — FLUOXETINE HCL 40 MG PO CAPS
40.0000 mg | ORAL_CAPSULE | Freq: Every day | ORAL | 0 refills | Status: DC
  Filled 2021-11-01: qty 30, 30d supply, fill #0

## 2021-11-01 MED ORDER — FUROSEMIDE 40 MG PO TABS
40.0000 mg | ORAL_TABLET | Freq: Every day | ORAL | 3 refills | Status: DC
  Filled 2021-11-01 – 2022-02-08 (×3): qty 30, 30d supply, fill #0

## 2021-11-01 MED ORDER — FLUOXETINE HCL 40 MG PO CAPS
40.0000 mg | ORAL_CAPSULE | Freq: Every day | ORAL | 3 refills | Status: DC
  Filled 2021-11-01: qty 30, 30d supply, fill #0

## 2021-11-01 MED ORDER — EZETIMIBE 10 MG PO TABS
10.0000 mg | ORAL_TABLET | Freq: Every day | ORAL | 3 refills | Status: DC
  Filled 2021-11-01: qty 30, 30d supply, fill #0

## 2021-11-01 NOTE — Patient Instructions (Addendum)
CONTINUE Bidil one tab three times per day STOP Spironolactone STOP Coreg RESTART Zetia 10 mg one tab daily RESTART Prozac 40 mg daily RESTART Hydroxyzine 25mg  one tab three times as needed INCREASE Lasix 40 mg one tab daily  A chest x-ray takes a picture of the organs and structures inside the chest, including the heart, lungs, and blood vessels. This test can show several things, including, whether the heart is enlarges; whether fluid is building up in the lungs; and whether pacemaker / defibrillator leads are still in place.   Your physician has requested that you have an echocardiogram. Echocardiography is a painless test that uses sound waves to create images of your heart. It provides your doctor with information about the size and shape of your heart and how well your heart's chambers and valves are working. This procedure takes approximately one hour. There are no restrictions for this procedure.  Your physician recommends that you schedule a follow-up appointment in: 1-2 weeks  in the Advanced Practitioners (PA/NP) Clinic    Do the following things EVERYDAY: Weigh yourself in the morning before breakfast. Write it down and keep it in a log. Take your medicines as prescribed Eat low salt foods--Limit salt (sodium) to 2000 mg per day.  Stay as active as you can everyday Limit all fluids for the day to less than 2 liters

## 2021-11-01 NOTE — Progress Notes (Signed)
ReDS Vest / Clip - 11/01/21 1000       ReDS Vest / Clip   Station Marker C    Ruler Value 24    ReDS Value Range High volume overload    ReDS Actual Value 44

## 2021-11-01 NOTE — Progress Notes (Addendum)
Advanced Heart Failure Clinic Note    PCP: Charlott Rakes, MD HF Cardiologist: Dr. Aundra Dubin  Reason for Visit: F/u for chronic systolic heart failure   HPI: Matthew Petersonis a 55 y.o. with PMH significant for combined systolic CHF with biventricular failure, T2DM, HTN, OSA, tobacco use, HLD, cocaine use, poor medication adherence, depression,  CVA, and CKD 3a.      He was first admitted with heart failure symptoms in 2015 in the setting of long standing uncontrolled hypertension and smoking.  Had a LHC with normal cors.    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  Was not followed by cardiology or hospitalized for CHF during this time period.     March 2021 he began having CHF exacerbations after running out of meds between admissions.  Left AMA in March. Another similar admission in July, he was out of his medications at that time as well.     Admitted 12/2020 with CHF exacerbation after running out of his medications.  He was diuresed, put on an excellent medication regimen. He also endorsed suicidal ideation at that time and was subsequently admitted to behavioral health. UDS was positive for cocaine, echo showed EF < 20% with moderate RV dysfunction.   He was seen in the HF clinic 02/08/21 and lasix was cut back to 20 mg daily, later Lasix was stopped.   Echo was done in 5/22 with EF improved to 50%.   Last clinic visit in June, Farxiga was stopped due to dizziness.  Seen 09/15/21 and was fluid overloaded and SOB. Had ran out of all of his meds, including lasix ~3 months prior. ReDs Clip elevated at 46%. BP 146/74. EKG showed brady, 51 bpm, slight inferior WT abnormalities (unchanged from prior) and LVH. He was given Lasix 80 mg IV x 1 in the office, then instructed to restart Lasix 40 mg po bid x 4 days then 40 mg daily thereafter. Entresto 49-51 was restarted + 25 of spiro and 1 tablet Bidil TID. Instructed to  f/u in 1 week for reassessment but no showed to the appt.   Post hospital follow up, ReDs remained 45% although weight was down 4 lb. He had stable NYHA class II symptoms, medication compliance improved. BP low and advised to hold Coreg and decrease BiDil to 1/2 tablet.  Today he returns returned for pharmacy visit, however he exhibited worsening symptoms and was seen by NP for acute visit . He remains SOB w/ activity. Used cocaine this weekend, smokes a few cigs on the weekend. He is dizzy all the time and very fatigued. Denies abnormal bleeding, CP, edema, or PND/Orthopnea. Appetite ok. No fever or chills. Weight at home 181 pounds. Unclear what medications he is taking. He tells me he has been out of spiro, Zetia, fluoxetine, hydroxyzine, and lasix for awhile. Continued to take beta blocker and BiDil 1 tablet when previously advised to stop beta blocker and reduce BiDil to 1/2 tablet.   ECG (personally reviewed): sinus bradycardia 52 bpm  REDS clip 44% ? accuracy  Labs (5/22): K 3.9, creatinine 1.6 Labs (6/22): K 3.8, creatinine 1.49 Labs (8/22): K 4.6 creatinine 1.25 Labs (9/22): K 4.1, creatinine 1.29, LDL 40  Labs (11/22): K 3.7 creatinine 1.91  PMH: 1. HTN 2. Type 2 diabetes 3. H/o cocaine abuse: Positive UDS 1/22.  4. CVA 2018 5. OSA: Uses CPAP.  6. Chronic systolic CHF: Nonischemic cardiomyopathy.  - LHC (  2015): Normal coronaries.  - Echo (2021): EF 40-45%. - Echo (1/22): EF <20%, moderate LV dilation, moderately decreased RV systolic function, severe LAE.  - Echo (5/22): EF 50%, moderate LV dilation, normal RV.  7. Depression 8. CKD stage 3 9. Hyperlipidemia  Current Outpatient Medications  Medication Sig Dispense Refill   aspirin EC 81 MG tablet Take 1 tablet (81 mg total) by mouth daily. Swallow whole. 30 tablet 11   atorvastatin (LIPITOR) 80 MG tablet TAKE 1 TABLET BY MOUTH DAILY. 30 tablet 3   glucose blood (ONE TOUCH ULTRA TEST) test strip Use as instructed 30 each  12   ibuprofen (ADVIL) 400 MG tablet Take 1 tablet (400 mg total) by mouth every 6 (six) hours as needed for headache or moderate pain. 30 tablet 0   sacubitril-valsartan (ENTRESTO) 49-51 MG Take 1 tablet by mouth 2 (two) times daily. 60 tablet 1   timolol (TIMOPTIC) 0.5 % ophthalmic solution Place 1 drop into both eyes daily after breakfast.     ezetimibe (ZETIA) 10 MG tablet Take 1 tablet (10 mg total) by mouth daily. 30 tablet 3   FLUoxetine (PROZAC) 40 MG capsule Take 1 capsule (40 mg total) by mouth daily. 30 capsule 0   furosemide (LASIX) 40 MG tablet Take 1 tablet (40 mg total) by mouth daily. 30 tablet 3   hydrOXYzine (ATARAX/VISTARIL) 25 MG tablet Take 1 tablet (25 mg total) by mouth 3 (three) times daily as needed for anxiety. 90 tablet 0   isosorbide-hydrALAZINE (BIDIL) 20-37.5 MG tablet Take 1 tablet by mouth 3 (three) times daily. 90 tablet 11   No current facility-administered medications for this encounter.   No Known Allergies  Social History   Socioeconomic History   Marital status: Widowed    Spouse name: Not on file   Number of children: 2   Years of education: 14   Highest education level: Not on file  Occupational History   Occupation: [employer]  Tobacco Use   Smoking status: Former    Years: 20.00    Types: Cigarettes   Smokeless tobacco: Former    Quit date: 11/14/2020  Vaping Use   Vaping Use: Never used  Substance and Sexual Activity   Alcohol use: No   Drug use: No   Sexual activity: Yes  Other Topics Concern   Not on file  Social History Narrative   Lives with 2 young children in [place].  Wife died in 22-May-2020.  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   Left-handed   Caffeine: occasional tea   Social Determinants of Radio broadcast assistant Strain: High Risk   Difficulty of Paying Living Expenses: Very hard  Food Insecurity: Landscape architect Present   Worried About Charity fundraiser in the Last Year: Sometimes  true   Arboriculturist in the Last Year: Sometimes true  Transportation Needs: Public librarian (Medical): Yes   Lack of Transportation (Non-Medical): Yes  Physical Activity: Not on file  Stress: Stress Concern Present   Feeling of Stress : Very much  Social Connections: Not on file  Intimate Partner Violence: Not on file   Family History  Problem Relation Age of Onset   Heart attack Father        died @ 24   Cervical cancer Mother        died @ 21   BP (!) 160/82   Pulse (!)  57   Wt 85.4 kg (188 lb 3.2 oz)   SpO2 100%   BMI 27.00 kg/m   Wt Readings from Last 3 Encounters:  11/01/21 85.4 kg (188 lb 3.2 oz)  10/17/21 83.7 kg (184 lb 9.6 oz)  09/15/21 85.3 kg (188 lb)   PHYSICAL EXAM: General: Mild resp difficulty speaking full sentences, appears mildly distressed/anxious HEENT: Normal Neck: Supple. No JVD. Carotids 2+ bilat; no bruits. No lymphadenopathy or thryomegaly appreciated. Cor: PMI nondisplaced. Regular rate & rhythm. No rubs, gallops or murmurs. Lungs: + wheezing Abdomen: Soft, nontender, nondistended. No hepatosplenomegaly. No bruits or masses. Good bowel sounds. Extremities: No cyanosis, clubbing, rash, edema Neuro: Alert & oriented x 3, cranial nerves grossly intact. Moves all 4 extremities w/o difficulty. Appears under the influence  ASSESSMENT & PLAN: 1. 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  Due to poorly controlled HTN +/- cocaine abuse.  Normal coronaries on 2015 cath.  Echo in 1/22 showed EF < 20% with moderate RV dysfunction (off meds, +cocaine).  Echo in 5/22 on meds and off cocaine with EF up to 50%.  Worse NYHA Class III-early IIIb symptoms today, however he is not volume overloaded on exam, ReDs elevated at 44%, ? accuracy. Previous ReDs 45% w/ normal BNP. He has been out of his lasix for a few days. Unclear what other medications he has been on.  - Restart Lasix 40 mg daily. BMET/BNP today. - Restart  BiDil 1/2 tab tid. Increase next visit. - Off spiro with elevated SCr. Plan to restart when SCr stabilizes. - Did not tolerate Farxiga due to dizziness  - Continue Entresto 49-51mg  bid.  - Hold Coreg w/ marginal HR today. - EF is out of ICD range, and likely not candidate with on-going substance abuse. - Repeat echo with worsening symptoms. If EF back down, may need RHC. - Obtain CXR. 2. OSA: Continue CPAP.  3. Tobacco Use Disorder: Stopped smoking in November 2021 but now back smoking a few cigarettes daily. Needs PFTs, these have been ordered. - CXR as above. 4. Cocaine Use Disorder:  UDS + on 12/2020. He cut back and EF improved on echo.  - Last used a few days ago. Encouraged abstinence.  5. CKD 3: Check BMET today  6. Hyperlipidemia: well controlled. LP 9/22 w/ LDL 40 mg/dL.  - Continue atorvastatin 80 mg daily. - Restart Zetia 10  mg daily. 7. Depression:He has been off of his SSRI for a couple week. ? If some of his symptoms are due to abrupt cessation. - Restart Prozac 40 mg daily.  - Has been referred to Psychiatry per PCP. Will need f/u w/ them for refills 8. T2DM: management per PCP. - Did not tolerate Farxiga due to dizziness 9. 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 10. Medical non-compliance: H/o poor compliance and on-going substance abuse. Recent discharge from Paramedicine as living situation deemed unsafe for paramedics. - Unclear what medications he has been taking. Appears to be under the influence today.   Will get labs, CXR and try to sort it out. Bill box filled with the medications he brought today. Refills sent to [facility] and personally delivered to patient by HFSW.   - Will have him follow up in 1-2 weeks with APP for continued medication titration.  [place], FNP 11/01/21

## 2021-11-02 ENCOUNTER — Encounter (HOSPITAL_COMMUNITY): Payer: Self-pay

## 2021-11-02 NOTE — Addendum Note (Signed)
Encounter addended by: Rafael Bihari, FNP on: 11/02/2021 7:38 AM  Actions taken: Clinical Note Signed

## 2021-11-16 ENCOUNTER — Telehealth (HOSPITAL_COMMUNITY): Payer: Self-pay

## 2021-11-16 NOTE — Telephone Encounter (Signed)
Called to confirm/remind patient of their appointment at the [facility] on 11/17/21.   Patient reminded to bring all medications and/or complete list.  Confirmed patient has transportation. Gave directions, instructed to utilize [place] parking.  Confirmed appointment prior to ending call.

## 2021-11-17 ENCOUNTER — Ambulatory Visit (HOSPITAL_COMMUNITY)
Admission: RE | Admit: 2021-11-17 | Discharge: 2021-11-17 | Disposition: A | Discharge status: Home / Self Care | Payer: Medicaid Other | Source: Ambulatory Visit | Attending: Family Medicine | Admitting: Family Medicine

## 2021-11-17 ENCOUNTER — Other Ambulatory Visit: Payer: Self-pay

## 2021-11-17 ENCOUNTER — Encounter (HOSPITAL_COMMUNITY): Payer: Self-pay

## 2021-11-17 VITALS — BP 128/78 | HR 77 | Wt 188.2 lb

## 2021-11-17 DIAGNOSIS — E785 Hyperlipidemia, unspecified: Secondary | ICD-10-CM | POA: Insufficient documentation

## 2021-11-17 DIAGNOSIS — I5082 Biventricular heart failure: Secondary | ICD-10-CM | POA: Insufficient documentation

## 2021-11-17 DIAGNOSIS — R42 Dizziness and giddiness: Secondary | ICD-10-CM | POA: Insufficient documentation

## 2021-11-17 DIAGNOSIS — Z09 Encounter for follow-up examination after completed treatment for conditions other than malignant neoplasm: Secondary | ICD-10-CM | POA: Insufficient documentation

## 2021-11-17 DIAGNOSIS — N1831 Chronic kidney disease, stage 3a: Secondary | ICD-10-CM | POA: Insufficient documentation

## 2021-11-17 DIAGNOSIS — R0789 Other chest pain: Secondary | ICD-10-CM | POA: Insufficient documentation

## 2021-11-17 DIAGNOSIS — I428 Other cardiomyopathies: Secondary | ICD-10-CM | POA: Insufficient documentation

## 2021-11-17 DIAGNOSIS — N182 Chronic kidney disease, stage 2 (mild): Secondary | ICD-10-CM

## 2021-11-17 DIAGNOSIS — I130 Hypertensive heart and chronic kidney disease with heart failure and stage 1 through stage 4 chronic kidney disease, or unspecified chronic kidney disease: Secondary | ICD-10-CM | POA: Insufficient documentation

## 2021-11-17 DIAGNOSIS — Z9114 Patient's other noncompliance with medication regimen: Secondary | ICD-10-CM

## 2021-11-17 DIAGNOSIS — I5042 Chronic combined systolic (congestive) and diastolic (congestive) heart failure: Secondary | ICD-10-CM | POA: Insufficient documentation

## 2021-11-17 DIAGNOSIS — E782 Mixed hyperlipidemia: Secondary | ICD-10-CM

## 2021-11-17 DIAGNOSIS — F17200 Nicotine dependence, unspecified, uncomplicated: Secondary | ICD-10-CM

## 2021-11-17 DIAGNOSIS — Z91199 Patient's noncompliance with other medical treatment and regimen due to unspecified reason: Secondary | ICD-10-CM | POA: Insufficient documentation

## 2021-11-17 DIAGNOSIS — Z79899 Other long term (current) drug therapy: Secondary | ICD-10-CM | POA: Insufficient documentation

## 2021-11-17 DIAGNOSIS — E1121 Type 2 diabetes mellitus with diabetic nephropathy: Secondary | ICD-10-CM

## 2021-11-17 DIAGNOSIS — Z87891 Personal history of nicotine dependence: Secondary | ICD-10-CM | POA: Insufficient documentation

## 2021-11-17 DIAGNOSIS — F1410 Cocaine abuse, uncomplicated: Secondary | ICD-10-CM | POA: Insufficient documentation

## 2021-11-17 DIAGNOSIS — G4733 Obstructive sleep apnea (adult) (pediatric): Secondary | ICD-10-CM | POA: Insufficient documentation

## 2021-11-17 DIAGNOSIS — R0600 Dyspnea, unspecified: Secondary | ICD-10-CM | POA: Insufficient documentation

## 2021-11-17 DIAGNOSIS — Z8673 Personal history of transient ischemic attack (TIA), and cerebral infarction without residual deficits: Secondary | ICD-10-CM | POA: Insufficient documentation

## 2021-11-17 DIAGNOSIS — F332 Major depressive disorder, recurrent severe without psychotic features: Secondary | ICD-10-CM | POA: Insufficient documentation

## 2021-11-17 DIAGNOSIS — E1122 Type 2 diabetes mellitus with diabetic chronic kidney disease: Secondary | ICD-10-CM | POA: Insufficient documentation

## 2021-11-17 DIAGNOSIS — F0631 Mood disorder due to known physiological condition with depressive features: Secondary | ICD-10-CM

## 2021-11-17 LAB — BASIC METABOLIC PANEL
Anion gap: 9 (ref 5–15)
BUN: 12 mg/dL (ref 6–20)
CO2: 25 mmol/L (ref 22–32)
Calcium: 9.1 mg/dL (ref 8.9–10.3)
Chloride: 104 mmol/L (ref 98–111)
Creatinine, Ser: 1.38 mg/dL — ABNORMAL HIGH (ref 0.61–1.24)
GFR, Estimated: >60 mL/min (ref >60)
Glucose, Bld: 225 mg/dL — ABNORMAL HIGH (ref 70–99)
Potassium: 3.5 mmol/L (ref 3.5–5.1)
Sodium: 138 mmol/L (ref 135–145)

## 2021-11-17 MED ORDER — SPIRONOLACTONE 25 MG PO TABS
12.5000 mg | ORAL_TABLET | Freq: Every day | ORAL | 3 refills | Status: DC
  Filled 2021-11-17 – 2022-02-08 (×3): qty 15, 30d supply, fill #0

## 2021-11-17 NOTE — Patient Instructions (Signed)
START Spironolactone 12.5 mg, one half tab daily  CHANGE Lasix to 20 mg one tab as needed for weight gain or swelling  Labs today We will only contact you if something comes back abnormal or we need to make some changes. Otherwise no news is good news!  Labs needed in 7-10 days  You have been referred to [facility] Psychiatry  -they will be in contact with an appointment  Your physician recommends that you schedule a follow-up appointment in: 4 weeks  in the Advanced Practitioners (PA/NP) Clinic and in 8 weeks with Dr Aundra Dubin  Do the following things EVERYDAY: Weigh yourself in the morning before breakfast. Write it down and keep it in a log. Take your medicines as prescribed Eat low salt foods--Limit salt (sodium) to 2000 mg per day.  Stay as active as you can everyday Limit all fluids for the day to less than 2 liters    At the [facility], you and your health needs are our priority. As part of our continuing mission to provide you with exceptional heart care, we have created designated Provider Care Teams. These Care Teams include your primary Cardiologist (physician) and Advanced Practice Providers (APPs- Physician Assistants and Nurse Practitioners) who all work together to provide you with the care you need, when you need it.   You may see any of the following providers on your designated Care Team at your next follow up: Dr Glori Bickers Dr Haynes Kerns, NP Lyda Jester, [facility] [place], [place] Audry Riles, PharmD   Please be sure to bring in all your medications bottles to every appointment.   If you have any questions or concerns before your next appointment please send [place] a message through  [place] or call our office at [phone].    TO LEAVE A MESSAGE FOR THE NURSE SELECT OPTION 2, PLEASE LEAVE A MESSAGE INCLUDING: YOUR NAME DATE OF BIRTH CALL BACK NUMBER REASON FOR CALL**this is important as we prioritize  the call backs  YOU WILL RECEIVE A CALL BACK THE SAME DAY AS LONG AS YOU CALL BEFORE 4:00 PM

## 2021-11-17 NOTE — Progress Notes (Signed)
ReDS Vest / Clip - 11/17/21 1400       ReDS Vest / Clip   Ruler Value 35    ReDS Value Range Low volume    ReDS Actual Value 35

## 2021-11-17 NOTE — Progress Notes (Signed)
Advanced Heart Failure Clinic Note    PCP: Charlott Rakes, MD HF Cardiologist: Dr. Aundra Dubin  Reason for Visit: F/u for chronic systolic heart failure   HPI: Matthew Petersonis a 55 y.o. with PMH significant for combined systolic CHF with biventricular failure, T2DM, HTN, OSA, tobacco use, HLD, cocaine use, poor medication adherence, depression,  CVA, and CKD 3a.      He was first admitted with heart failure symptoms in 2015 in the setting of long standing uncontrolled hypertension and smoking.  Had a LHC with normal cors.    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  Was not followed by cardiology or hospitalized for CHF during this time period.     March 2021 he began having CHF exacerbations after running out of meds between admissions.  Left AMA in March. Another similar admission in July, he was out of his medications at that time as well.     Admitted 12/2020 with CHF exacerbation after running out of his medications.  He was diuresed, put on an excellent medication regimen. He also endorsed suicidal ideation at that time and was subsequently admitted to behavioral health. UDS was positive for cocaine, echo showed EF < 20% with moderate RV dysfunction.   He was seen in the HF clinic 02/08/21 and lasix was cut back to 20 mg daily, later Lasix was stopped.   Echo was done in 5/22 with EF improved to 50%.   Last clinic visit in June, Farxiga was stopped due to dizziness.  Seen 09/15/21 and was fluid overloaded and SOB. Had ran out of all of his meds, including lasix ~3 months prior. ReDs Clip elevated at 46%. BP 146/74. EKG showed brady, 51 bpm, slight inferior WT abnormalities (unchanged from prior) and LVH. He was given Lasix 80 mg IV x 1 in the office, then instructed to restart Lasix 40 mg po bid x 4 days then 40 mg daily thereafter. Entresto 49-51 was restarted + 25 of spiro and 1 tablet Bidil TID. Instructed to  f/u in 1 week for reassessment but no showed to the appt.   Post hospital follow up, ReDs remained 45% although weight was down 4 lb. He had stable NYHA class II symptoms, medication compliance improved. BP low and advised to hold Coreg and decrease BiDil to 1/2 tablet.  Follow up 11/22 he had been off several medications, more SOB, continued to use cocaine. Mediations restarted, pillbox filled.   Today he returns for HF follow up. Has on-going atypical chest pain. He is dyspneic with increased physical activity. He has positional dizziness. [place] a week ago, got tired and legs gave out. Denies abnormal bleeding, edema, or PND/Orthopnea. Appetite fair. No fever or chills. Weight at home 180-185 pounds. Taking all medications. Last used cocaine 2 days ago, no ETOH use, he has stopped smoking. Has daily feelings of self-harm, no plan to carry out.  ECG (personally reviewed): none ordered today.  REDS clip 35%   Labs (5/22): K 3.9, creatinine 1.6 Labs (6/22): K 3.8, creatinine 1.49 Labs (8/22): K 4.6 creatinine 1.25 Labs (9/22): K 4.1, creatinine 1.29, LDL 40  Labs (11/22): K 3.7 creatinine 1.91  PMH: 1. HTN 2. Type 2 diabetes 3. H/o cocaine abuse: Positive UDS 1/22.  4. CVA 2018 5. OSA: Uses CPAP.  6. Chronic systolic CHF: Nonischemic cardiomyopathy.  - LHC (2015): Normal coronaries.  - Echo (2021): EF 40-45%. - Echo (1/22): EF <20%,  moderate LV dilation, moderately decreased RV systolic function, severe LAE.  - Echo (5/22): EF 50%, moderate LV dilation, normal RV.  7. Depression 8. CKD stage 3 9. Hyperlipidemia  Current Outpatient Medications  Medication Sig Dispense Refill   atorvastatin (LIPITOR) 80 MG tablet TAKE 1 TABLET BY MOUTH DAILY. 30 tablet 3   ezetimibe (ZETIA) 10 MG tablet Take 1 tablet (10 mg total) by mouth daily. 30 tablet 3   FLUoxetine (PROZAC) 40 MG capsule Take 1 capsule (40 mg total) by mouth daily. 30 capsule 0   furosemide (LASIX) 40 MG tablet Take 1 tablet  (40 mg total) by mouth daily. 30 tablet 3   glucose blood (ONE TOUCH ULTRA TEST) test strip Use as instructed 30 each 12   hydrOXYzine (ATARAX/VISTARIL) 25 MG tablet Take 1 tablet (25 mg total) by mouth 3 (three) times daily as needed for anxiety. 90 tablet 0   ibuprofen (ADVIL) 400 MG tablet Take 1 tablet (400 mg total) by mouth every 6 (six) hours as needed for headache or moderate pain. 30 tablet 0   isosorbide-hydrALAZINE (BIDIL) 20-37.5 MG tablet Take 1 tablet by mouth 3 (three) times daily. 90 tablet 11   sacubitril-valsartan (ENTRESTO) 49-51 MG Take 1 tablet by mouth 2 (two) times daily. 60 tablet 1   timolol (TIMOPTIC) 0.5 % ophthalmic solution Place 1 drop into both eyes daily after breakfast.     aspirin EC 81 MG tablet Take 1 tablet (81 mg total) by mouth daily. Swallow whole. (Patient not taking: Reported on 11/17/2021) 30 tablet 11   No current facility-administered medications for this encounter.   No Known Allergies  Social History   Socioeconomic History   Marital status: Widowed    Spouse name: Not on file   Number of children: 2   Years of education: 14   Highest education level: Not on file  Occupational History   Occupation: [employer]  Tobacco Use   Smoking status: Former    Years: 20.00    Types: Cigarettes   Smokeless tobacco: Former    Quit date: 11/14/2020  Vaping Use   Vaping Use: Never used  Substance and Sexual Activity   Alcohol use: No   Drug use: No   Sexual activity: Yes  Other Topics Concern   Not on file  Social History Narrative   Lives with 2 young children in [place].  Wife died in May 20, 2020.  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   Left-handed   Caffeine: occasional tea   Social Determinants of Radio broadcast assistant Strain: High Risk   Difficulty of Paying Living Expenses: Very hard  Food Insecurity: Landscape architect Present   Worried About Charity fundraiser in the Last Year: Sometimes true   Youth worker in the Last Year: Sometimes true  Transportation Needs: Public librarian (Medical): Yes   Lack of Transportation (Non-Medical): Yes  Physical Activity: Not on file  Stress: Stress Concern Present   Feeling of Stress : Very much  Social Connections: Not on file  Intimate Partner Violence: Not on file   Family History  Problem Relation Age of Onset   Heart attack Father        died @ 27   Cervical cancer Mother        died @ 102   BP 128/78   Pulse 77   Wt 85.4 kg (188 lb 3.2 oz)  SpO2 98%   BMI 27.00 kg/m   Wt Readings from Last 3 Encounters:  11/17/21 85.4 kg (188 lb 3.2 oz)  11/01/21 85.4 kg (188 lb 3.2 oz)  10/17/21 83.7 kg (184 lb 9.6 oz)   PHYSICAL EXAM: General:  NAD. No resp difficulty HEENT: Normal Neck: Supple. No JVD. Carotids 2+ bilat; no bruits. No lymphadenopathy or thryomegaly appreciated. Cor: PMI nondisplaced. Regular rate & rhythm. No rubs, gallops or murmurs. Lungs: Faint wheezes throughout all lung fields. Abdomen: Soft, nontender, nondistended. No hepatosplenomegaly. No bruits or masses. Good bowel sounds. Extremities: No cyanosis, clubbing, rash, edema Neuro: Alert & oriented x 3, cranial nerves grossly intact. Moves all 4 extremities w/o difficulty. Affect pleasant.  ASSESSMENT & PLAN: 1. 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  Due to poorly controlled HTN +/- cocaine abuse.  Normal coronaries on 2015 cath.  Echo in 1/22 showed EF < 20% with moderate RV dysfunction (off meds, +cocaine).  Echo in 5/22 on meds and off cocaine with EF up to 50%. Stable NYHA Class III today. He is not volume overloaded on exam, ReDs 35%. - Start spironolactone 12.5 mg daily. BMET today, repeat in 1 week and 1 month. - Change Lasix to 40 mg PRN weight gain/edema. - Continue BiDil 1 tab tid. - Continue Entresto 49-51 mg bid.  - Did not tolerate Farxiga due to dizziness  - Add beta blocker next. - EF previously out  of ICD range, and likely not candidate with on-going substance abuse. - Repeat echo with worsening symptoms. If EF back down, may need RHC. 2. OSA: Continue CPAP.  3. Tobacco Use Disorder: Stopped smoking in November 2021 but now back smoking a few cigarettes daily. Needs PFTs, these have been ordered. 4. Cocaine Use Disorder:  UDS + on 12/2020. He cut back and EF improved on echo.  - Last used a few days ago. Encouraged abstinence.  5. CKD 3: Check BMET today  6. Hyperlipidemia: well controlled. LP 9/22 w/ LDL 40 mg/dL.  - Continue atorvastatin 80 mg daily + Zetia.. 7. Depression: He has been referred to psychiatry by PCP, however they were unable to get in contact with him. Number updated in chart, will re-refer. - Continue Prozac 40 mg daily.  - He has thoughts of self-harm, but no active plan. Discussed ED precautions. 8. T2DM: management per PCP. - Did not tolerate Farxiga due to dizziness 9. 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 10. Medical non-compliance: H/o poor compliance and on-going substance abuse. Recent discharge from Paramedicine as living situation deemed unsafe for paramedics. - Seems to be doing better with medication compliance.  Follow up with APP in 4 weeks (add beta blocker) and Dr. Aundra Dubin in 8 weeks.  [place], FNP 11/17/21

## 2021-11-20 ENCOUNTER — Other Ambulatory Visit: Payer: Self-pay

## 2021-11-21 ENCOUNTER — Other Ambulatory Visit: Payer: Self-pay

## 2021-11-27 ENCOUNTER — Ambulatory Visit (HOSPITAL_COMMUNITY)
Admission: RE | Admit: 2021-11-27 | Discharge: 2021-11-27 | Disposition: A | Discharge status: Home / Self Care | Payer: Medicaid Other | Source: Ambulatory Visit | Attending: Internal Medicine | Admitting: Internal Medicine

## 2021-11-27 ENCOUNTER — Other Ambulatory Visit: Payer: Self-pay

## 2021-11-27 DIAGNOSIS — I5042 Chronic combined systolic (congestive) and diastolic (congestive) heart failure: Secondary | ICD-10-CM

## 2021-11-27 LAB — BASIC METABOLIC PANEL
Anion gap: 6 (ref 5–15)
BUN: 11 mg/dL (ref 6–20)
CO2: 26 mmol/L (ref 22–32)
Calcium: 8.8 mg/dL — ABNORMAL LOW (ref 8.9–10.3)
Chloride: 107 mmol/L (ref 98–111)
Creatinine, Ser: 1.33 mg/dL — ABNORMAL HIGH (ref 0.61–1.24)
GFR, Estimated: >60 mL/min (ref >60)
Glucose, Bld: 118 mg/dL — ABNORMAL HIGH (ref 70–99)
Potassium: 4 mmol/L (ref 3.5–5.1)
Sodium: 139 mmol/L (ref 135–145)

## 2021-11-27 NOTE — Progress Notes (Signed)
Patient requested that staff member assist him and fill his pill box.  I filled 2 weeks of medications in pill boxes for him today during clinic visit using medications that he provided.

## 2021-11-28 ENCOUNTER — Other Ambulatory Visit: Payer: Self-pay

## 2021-12-04 ENCOUNTER — Ambulatory Visit: Payer: Self-pay

## 2021-12-04 ENCOUNTER — Telehealth (HOSPITAL_COMMUNITY): Payer: Self-pay | Admitting: Licensed Clinical Social Worker

## 2021-12-04 NOTE — Telephone Encounter (Signed)
Pt called CSW to discuss current concerns with his eye.  States one of his eyes has lost vision and is throbbing over the past week.  Pt cannot find eye doctor to see him without insurance or enough funds for self pay.  Explained to pt that we can help with referral for eye exam he can get for free but with pt symptoms probably needs to be see ASAP.  Encouraged him to call PCP to see if he can get sick visit or go to urgent care center to be evaluated.  Pt able to get visit tomorrow morning with PCP- CSW set up [facility] Transportation to get to appt.  Will continue to follow and assist as needed  Matthew [name], [facility] Desk#: [phone] Cell#: [phone]

## 2021-12-04 NOTE — Telephone Encounter (Signed)
Pt states he will be here in the morning

## 2021-12-04 NOTE — Telephone Encounter (Signed)
°  Chief Complaint: blurred vision Symptoms: blurred vision, headache, eye pain Frequency: >7 days Pertinent Negatives: NA Disposition: [] ED /[] Urgent Care (no appt availability in office) / [x] Appointment(In office/virtual)/ []  [facility]/ [] Home Care/ [] Refused Recommended Disposition  Additional Notes: Pt scheduled for appt in the morning at 0830. Advised if he could go to UC today to be seen then to call back and let us know to cancel appt. Pt uses transportation system so didn't know which he was able to do but let us know if appt needed to be canceled.    Message from Oneta Rack sent at 12/04/2021  1:54 PM EST  Summary: Blurred Vision / Head aches   Blurred vision in right eye for 1 week, experiencing head aches.         Reason for Disposition  [1] Eye pain AND [2] brief (now gone) blurred vision or visual changes  Answer Assessment - Initial Assessment Questions 1. DESCRIPTION: "What is the vision loss like? Describe it for me." (e.g., complete vision loss, blurred vision, double vision, floaters, etc.)     Blurred vision  2. LOCATION: "One or both eyes?" If one, ask: "Which eye?"     R eye 3. SEVERITY: "Can you see anything?" If Yes, ask: "What can you see?" (e.g., fine print)     yes 4. ONSET: "When did this begin?" "Did it start suddenly or has this been gradual?"     1 week  5. PATTERN: "Does this come and go, or has it been constant since it started?"     constant 6. PAIN: "Is there any pain in your eye(s)?"  (Scale 1-10; or mild, moderate, severe)     7 7. CONTACTS-GLASSES: "Do you wear contacts or glasses?"     No 9. OTHER SYMPTOMS: "Do you have any other symptoms?" (e.g., confusion, headache, arm or leg weakness, speech problems)     Headache on R side, confused at times  Protocols used: Vision Loss or Change-A-AH

## 2021-12-05 ENCOUNTER — Other Ambulatory Visit: Payer: Self-pay

## 2021-12-05 ENCOUNTER — Ambulatory Visit: Payer: Medicaid Other | Attending: Internal Medicine | Admitting: Internal Medicine

## 2021-12-05 VITALS — BP 174/97 | HR 65 | Ht 69.0 in | Wt 196.6 lb

## 2021-12-05 DIAGNOSIS — I10 Essential (primary) hypertension: Secondary | ICD-10-CM

## 2021-12-05 DIAGNOSIS — E1121 Type 2 diabetes mellitus with diabetic nephropathy: Secondary | ICD-10-CM

## 2021-12-05 DIAGNOSIS — H538 Other visual disturbances: Secondary | ICD-10-CM

## 2021-12-05 LAB — POCT GLYCOSYLATED HEMOGLOBIN (HGB A1C): HbA1c, POC (controlled diabetic range): 5.8 % (ref 0.0–7.0)

## 2021-12-05 LAB — GLUCOSE, POCT (MANUAL RESULT ENTRY): POC Glucose: 96 mg/dl (ref 70–99)

## 2021-12-05 NOTE — Patient Instructions (Signed)
I have submitted a referral for you to see your eye doctor Dr. Katy Fitch as soon as possible. Your blood pressure is not controlled.  This increases your risk for another stroke.  Please check your blood pressure at least 2-3 times a week with goal being 130/80 or lower.  Please write down the numbers and bring them in when you come to see the clinical pharmacist in 2 weeks.

## 2021-12-05 NOTE — Progress Notes (Signed)
Patient ID: [name]., male    DOB: Jul 08, 1966  MRN: [id number]  CC: Blurred Vision   Subjective: [name] is a 55 y.o. male who presents for UC visit.  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 Endorses pain in the eye. Not sure of redness or tearing.  Endorses photophobia.  Pt not sure if he has glaucoma which is listed on his chart and timolol eye drop on med list.  States he ran out of this a long time ago.  Does not were glasses.  Eye physician is Dr. Schuyler [name]; not seen in several yrs per pt. has an upcoming appointment in April of next year. -does not check BS.    HTN:  BP elev.  Reports taking all of meds this a.m - Entresto, Spir, Bidil and [name] He has a BP device at home but not checking BP.  Recently saw cardiology on the second of this month and blood pressure was good.   Patient Active Problem List   Diagnosis Date Noted   Impacted cerumen of right ear 03/24/2021   MDD (major depressive disorder), recurrent severe, without psychosis ([name]) 01/03/2021   Acute exacerbation of congestive heart failure ([place]) 12/24/2020   Acute respiratory failure with hypoxia ([place]) 02/15/2020   Acute on chronic combined systolic and diastolic CHF (congestive heart failure) ([place]) 02/15/2020   Hypertensive urgency 02/15/2020   CHF (congestive heart failure) ([place]) 02/15/2020   Depression due to physical illness 11/18/2018   Expressive aphasia 02/20/2017   Mixed hyperlipidemia    History of stroke 01/31/2017   Erectile dysfunction 07/30/2016   OSA (obstructive sleep apnea) 05/10/2015   CKD (chronic kidney disease)  stage 2, GFR 60-89 ml/min 03/10/2014   Diabetes mellitus type II, controlled ([place]) 03/10/2014   Chronic combined systolic and diastolic congestive heart failure ([place]) 03/03/2014   Smoker    Morbid obesity ([place])    Hypertension 03/02/2014     Current Outpatient Medications on File Prior to Visit  Medication Sig Dispense Refill   atorvastatin (LIPITOR) 80 MG tablet TAKE 1 TABLET BY MOUTH DAILY. 30 tablet 3   ezetimibe (ZETIA) 10 MG tablet Take 1 tablet (10 mg total) by mouth daily. 30 tablet 3   FLUoxetine (PROZAC) 40 MG capsule Take 1 capsule (40 mg total) by mouth daily. 30 capsule 0   furosemide (LASIX) 40 MG tablet Take 1 tablet (40 mg total) by mouth daily. 30 tablet 3   glucose blood (ONE TOUCH ULTRA TEST) test strip Use as instructed 30 each 12   hydrOXYzine (ATARAX/VISTARIL) 25 MG tablet Take 1 tablet (25 mg total) by mouth 3 (three) times daily as needed for anxiety. 90 tablet 0   ibuprofen (ADVIL) 400 MG tablet Take 1 tablet (400 mg total) by mouth every 6 (six) hours as needed for headache or moderate pain. 30 tablet 0   isosorbide-hydrALAZINE (BIDIL) 20-37.5 MG tablet Take 1 tablet by mouth 3 (three) times daily. 90 tablet 11   sacubitril-valsartan (ENTRESTO) 49-51 MG Take 1 tablet by mouth 2 (two) times daily. 60 tablet 1   spironolactone (ALDACTONE) 25 MG tablet Take  0.5 tablets (12.5 mg total) by mouth daily. 45 tablet 3   timolol (TIMOPTIC) 0.5 % ophthalmic solution Place 1 drop into both eyes daily after breakfast.     aspirin EC 81 MG tablet Take 1 tablet (81 mg total) by mouth daily. Swallow whole. (Patient not taking: Reported on 11/17/2021) 30 tablet 11   [DISCONTINUED] lovastatin (MEVACOR) 20 MG tablet Take 2 tablets (40 mg total) by mouth at bedtime. 60 tablet 1   No current facility-administered medications on file prior to visit.    No Known Allergies  Social History   Socioeconomic History   Marital status: Widowed    Spouse name: Not on file   Number of  children: 2   Years of education: 14   Highest education level: Not on file  Occupational History   Occupation: [employer]  Tobacco Use   Smoking status: Former    Years: 20.00    Types: Cigarettes   Smokeless tobacco: Former    Quit date: 11/14/2020  Vaping Use   Vaping Use: Never used  Substance and Sexual Activity   Alcohol use: No   Drug use: No   Sexual activity: Yes  Other Topics Concern   Not on file  Social History Narrative   Lives with 2 young children in [name].  Wife died in 05-26-2020.  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   Left-handed   Caffeine: occasional tea   Social Determinants of Radio broadcast assistant Strain: High Risk   Difficulty of Paying Living Expenses: Very hard  Food Insecurity: Landscape architect Present   Worried About Charity fundraiser in the Last Year: Sometimes true   Arboriculturist in the Last Year: Sometimes true  Transportation Needs: Public librarian (Medical): Yes   Lack of Transportation (Non-Medical): Yes  Physical Activity: Not on file  Stress: Stress Concern Present   Feeling of Stress : Very much  Social Connections: Not on file  Intimate Partner Violence: Not on file    Family History  Problem Relation Age of Onset   Heart attack Father        died @ 36   Cervical cancer Mother        died @ 57    Past Surgical History:  Procedure Laterality Date   CATARACT EXTRACTION Right    LEFT HEART CATHETERIZATION WITH CORONARY ANGIOGRAM N/A 03/04/2014   Procedure: [place];  Surgeon: [place] Blanks, MD;  [facility] LAB;  Service: Cardiovascular;  Laterality: N/A;   TEE WITHOUT CARDIOVERSION N/A 02/04/2017   Procedure: TRANSESOPHAGEAL ECHOCARDIOGRAM (TEE);  Surgeon: [place] Spark, MD;  Location: [facility] ENDOSCOPY;  Service: Cardiovascular;  Laterality: N/A;    ROS: Review of Systems Negative except as  stated above  PHYSICAL EXAM: BP (!) 174/97    Pulse 65    Ht 5\' 9"  (1.753 m)    Wt 196 lb 9.6 oz (89.2 kg)    SpO2 99%    BMI 29.03 kg/m   Physical Exam  General appearance - alert, well appearing, middle-aged African-American male and in no distress Mental status - normal mood, behavior.  He is somewhat forgetful at times. Eyes -extraocular movement intact.  Pupillary constriction to light is slightly less on the right compared to the left.   Neck -no carotid bruits. Neurological -gait is normal.  Slight decreased grip on the left compared  to the right.  Results for orders placed or performed in visit on 12/05/21  POCT glucose (manual entry)  Result Value Ref Range   POC Glucose 96 70 - 99 mg/dl  POCT glycosylated hemoglobin (Hb A1C)  Result Value Ref Range   Hemoglobin A1C     HbA1c POC (<> result, manual entry)     HbA1c, POC (prediabetic range)     HbA1c, POC (controlled diabetic range) 5.8 0.0 - 7.0 %    CMP Latest Ref Rng & Units 11/27/2021 11/17/2021 11/01/2021  Glucose 70 - 99 mg/dL 118(H) 225(H) 185(H)  BUN 6 - 20 mg/dL 11 12 9   Creatinine 0.61 - 1.24 mg/dL 1.33(H) 1.38(H) 1.38(H)  Sodium 135 - 145 mmol/L 139 138 138  Potassium 3.5 - 5.1 mmol/L 4.0 3.5 3.5  Chloride 98 - 111 mmol/L 107 104 105  CO2 22 - 32 mmol/L 26 25 26   Calcium 8.9 - 10.3 mg/dL 8.8(L) 9.1 9.0  Total Protein 6.5 - 8.1 g/dL - - -  Total Bilirubin 0.3 - 1.2 mg/dL - - -  Alkaline Phos 38 - 126 U/L - - -  AST 15 - 41 U/L - - -  ALT 0 - 44 U/L - - -   Lipid Panel     Component Value Date/Time   CHOL 99 09/15/2021 1456   CHOL 144 03/07/2018 0834   TRIG 34 09/15/2021 1456   HDL 52 09/15/2021 1456   HDL 36 (L) 03/07/2018 0834   CHOLHDL 1.9 09/15/2021 1456   VLDL 7 09/15/2021 1456   LDLCALC 40 09/15/2021 1456   LDLCALC 73 03/07/2018 0834    CBC    Component Value Date/Time   WBC 4.0 07/28/2021 0258   RBC 4.70 07/28/2021 0258   HGB 14.4 07/28/2021 0258   HCT 40.3 07/28/2021 0258   PLT 198  07/28/2021 0258   MCV 85.7 07/28/2021 0258   MCH 30.6 07/28/2021 0258   MCHC 35.7 07/28/2021 0258   RDW 11.9 07/28/2021 0258   LYMPHSABS 1.9 12/26/2020 0125   MONOABS 0.7 12/26/2020 0125   EOSABS 0.1 12/26/2020 0125   BASOSABS 0.0 12/26/2020 0125    ASSESSMENT AND PLAN: 1. Blurred vision, right eye Patient presenting with history of blurred vision in right eye with some pain in the right eye.  History of glaucoma listed on the chart.  I recommend that we try to get him in with his ophthalmologist Dr. Katy Fitch as soon as possible.  Message sent to our referral coordinator. - Ambulatory referral to Ophthalmology  2. Essential hypertension Blood pressure not at goal.  He reports having taken all of his medications already for the morning.  I recommend starting low-dose of amlodipine.  Patient declined stating he does not want to take more medications.  He is agreeable to following up with the clinical pharmacist in 2 weeks for repeat blood pressure check.  3. Controlled type 2 diabetes mellitus with diabetic nephropathy, without long-term current use of insulin (HCC) A1c is at goal.  This was not addressed today. - POCT glucose (manual entry) - POCT glycosylated hemoglobin (Hb A1C)  Patient states that he has paperwork for him applying for disability.  I recommend that he bring those forms when he sees his regular provider next month.  Patient was given the opportunity to ask questions.  Patient verbalized understanding of the plan and was able to repeat key elements of the plan.   Orders Placed This Encounter  Procedures   Ambulatory referral to Ophthalmology  POCT glucose (manual entry)   POCT glycosylated hemoglobin (Hb A1C)     Requested Prescriptions    No prescriptions requested or ordered in this encounter    Return in about 1 month (around 01/05/2022) for Give f/u with [name] in 1 mth and with [facility] in 2 wks for recheck BP.  Karle Plumber, MD, FACP

## 2021-12-08 ENCOUNTER — Telehealth (HOSPITAL_COMMUNITY): Payer: Self-pay

## 2021-12-08 NOTE — Telephone Encounter (Signed)
Called and left patient a message to confirm/remind patient of their appointment at the [facility] on 12/27.

## 2021-12-08 NOTE — Progress Notes (Incomplete)
Advanced Heart Failure Clinic Note    PCP: [place] Rakes, MD HF Cardiologist: Dr. Aundra Dubin  Reason for Visit: F/u for chronic systolic heart failure   HPI: [place]is a 55 y.o. with PMH significant for combined systolic CHF with biventricular failure, T2DM, HTN, OSA, tobacco use, HLD, cocaine use, poor medication adherence, depression,  CVA, and CKD 3a.      He was first admitted with heart failure symptoms in 2015 in the setting of long standing uncontrolled hypertension and smoking.  Had a LHC with normal cors.    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  Was not followed by cardiology or hospitalized for CHF during this time period.     March 2021 he began having CHF exacerbations after running out of meds between admissions.  Left AMA in March. Another similar admission in July, he was out of his medications at that time as well.     Admitted 12/2020 with CHF exacerbation after running out of his medications.  He was diuresed, put on an excellent medication regimen. He also endorsed suicidal ideation at that time and was subsequently admitted to behavioral health. UDS was positive for cocaine, echo showed EF < 20% with moderate RV dysfunction.   He was seen in the HF clinic 02/08/21 and lasix was cut back to 20 mg daily, later Lasix was stopped.   Echo was done in 5/22 with EF improved to 50%.   Last clinic visit in June, Farxiga was stopped due to dizziness.  Seen 09/15/21 and was fluid overloaded and SOB. Had ran out of all of his meds, including lasix ~3 months prior. ReDs Clip elevated at 46%. BP 146/74. EKG showed brady, 51 bpm, slight inferior WT abnormalities (unchanged from prior) and LVH. He was given Lasix 80 mg IV x 1 in the office, then instructed to restart Lasix 40 mg po bid x 4 days then 40 mg daily thereafter. Entresto 49-51 was restarted + 25 of spiro and 1 tablet Bidil TID. Instructed to  f/u in 1 week for reassessment but no showed to the appt.   Post hospital follow up, ReDs remained 45% although weight was down 4 lb. He had stable NYHA class II symptoms, medication compliance improved. BP low and advised to hold Coreg and decrease BiDil to 1/2 tablet.  Follow up 11/22 he had been off several medications, more SOB, continued to use cocaine. Mediations restarted, pillbox filled.   Today he returns for HF follow up. Has on-going atypical chest pain. He is dyspneic with increased physical activity. He has positional dizziness. [place] a week ago, got tired and legs gave out. Denies abnormal bleeding, edema, or PND/Orthopnea. Appetite fair. No fever or chills. Weight at home 180-185 pounds. Taking all medications. Last used cocaine 2 days ago, no ETOH use, he has stopped smoking. Has daily feelings of self-harm, no plan to carry out.  ECG (personally reviewed): none ordered today.  REDS clip 35%   Labs (5/22): K 3.9, creatinine 1.6 Labs (6/22): K 3.8, creatinine 1.49 Labs (8/22): K 4.6 creatinine 1.25 Labs (9/22): K 4.1, creatinine 1.29, LDL 40  Labs (11/22): K 3.7 creatinine 1.91  PMH: 1. HTN 2. Type 2 diabetes 3. H/o cocaine abuse: Positive UDS 1/22.  4. CVA 2018 5. OSA: Uses CPAP.  6. Chronic systolic CHF: Nonischemic cardiomyopathy.  - LHC (2015): Normal coronaries.  - Echo (2021): EF 40-45%. - Echo (1/22): EF <20%,  moderate LV dilation, moderately decreased RV systolic function, severe LAE.  - Echo (5/22): EF 50%, moderate LV dilation, normal RV.  7. Depression 8. CKD stage 3 9. Hyperlipidemia  Current Outpatient Medications  Medication Sig Dispense Refill   aspirin EC 81 MG tablet Take 1 tablet (81 mg total) by mouth daily. Swallow whole. (Patient not taking: Reported on 11/17/2021) 30 tablet 11   atorvastatin (LIPITOR) 80 MG tablet TAKE 1 TABLET BY MOUTH DAILY. 30 tablet 3   ezetimibe (ZETIA) 10 MG tablet Take 1 tablet (10 mg total) by mouth daily. 30 tablet 3    FLUoxetine (PROZAC) 40 MG capsule Take 1 capsule (40 mg total) by mouth daily. 30 capsule 0   furosemide (LASIX) 40 MG tablet Take 1 tablet (40 mg total) by mouth daily. 30 tablet 3   glucose blood (ONE TOUCH ULTRA TEST) test strip Use as instructed 30 each 12   hydrOXYzine (ATARAX/VISTARIL) 25 MG tablet Take 1 tablet (25 mg total) by mouth 3 (three) times daily as needed for anxiety. 90 tablet 0   ibuprofen (ADVIL) 400 MG tablet Take 1 tablet (400 mg total) by mouth every 6 (six) hours as needed for headache or moderate pain. 30 tablet 0   isosorbide-hydrALAZINE (BIDIL) 20-37.5 MG tablet Take 1 tablet by mouth 3 (three) times daily. 90 tablet 11   sacubitril-valsartan (ENTRESTO) 49-51 MG Take 1 tablet by mouth 2 (two) times daily. 60 tablet 1   spironolactone (ALDACTONE) 25 MG tablet Take 0.5 tablets (12.5 mg total) by mouth daily. 45 tablet 3   timolol (TIMOPTIC) 0.5 % ophthalmic solution Place 1 drop into both eyes daily after breakfast.     No current facility-administered medications for this visit.   No Known Allergies  Social History   Socioeconomic History   Marital status: Widowed    Spouse name: Not on file   Number of children: 2   Years of education: 14   Highest education level: Not on file  Occupational History   Occupation: [employer]  Tobacco Use   Smoking status: Former    Years: 20.00    Types: Cigarettes   Smokeless tobacco: Former    Quit date: 11/14/2020  Vaping Use   Vaping Use: Never used  Substance and Sexual Activity   Alcohol use: No   Drug use: No   Sexual activity: Yes  Other Topics Concern   Not on file  Social History Narrative   Lives with 2 young children in [place].  Wife died in 05/17/20.  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   Left-handed   Caffeine: occasional tea   Social Determinants of Radio broadcast assistant Strain: High Risk   Difficulty of Paying Living Expenses: Very hard  Food Insecurity: Tax adviser Present   Worried About Charity fundraiser in the Last Year: Sometimes true   Arboriculturist in the Last Year: Sometimes true  Transportation Needs: Unmet Transportation Needs   Lack of Transportation (Medical): Yes   Lack of Transportation (Non-Medical): Yes  Physical Activity: Not on file  Stress: Stress Concern Present   Feeling of Stress : Very much  Social Connections: Not on file  Intimate Partner Violence: Not on file   Family History  Problem Relation Age of Onset   Heart attack Father        died @ 28   Cervical cancer Mother        died @  72   There were no vitals taken for this visit.  Wt Readings from Last 3 Encounters:  12/05/21 89.2 kg (196 lb 9.6 oz)  11/17/21 85.4 kg (188 lb 3.2 oz)  11/01/21 85.4 kg (188 lb 3.2 oz)   PHYSICAL EXAM: General:  NAD. No resp difficulty HEENT: Normal Neck: Supple. No JVD. Carotids 2+ bilat; no bruits. No lymphadenopathy or thryomegaly appreciated. Cor: PMI nondisplaced. Regular rate & rhythm. No rubs, gallops or murmurs. Lungs: Faint wheezes throughout all lung fields. Abdomen: Soft, nontender, nondistended. No hepatosplenomegaly. No bruits or masses. Good bowel sounds. Extremities: No cyanosis, clubbing, rash, edema Neuro: Alert & oriented x 3, cranial nerves grossly intact. Moves all 4 extremities w/o difficulty. Affect pleasant.  ASSESSMENT & PLAN: 1. 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  Due to poorly controlled HTN +/- cocaine abuse.  Normal coronaries on 2015 cath.  Echo in 1/22 showed EF < 20% with moderate RV dysfunction (off meds, +cocaine).  Echo in 5/22 on meds and off cocaine with EF up to 50%. Stable NYHA Class III today. He is not volume overloaded on exam, ReDs 35%. - Start spironolactone 12.5 mg daily. BMET today, repeat in 1 week and 1 month. - Change Lasix to 40 mg PRN weight gain/edema. - Continue BiDil 1 tab tid. - Continue Entresto 49-51 mg bid.  - Did not tolerate Farxiga due to  dizziness  - Add beta blocker next. - EF previously out of ICD range, and likely not candidate with on-going substance abuse. - Repeat echo with worsening symptoms. If EF back down, may need RHC. 2. OSA: Continue CPAP.  3. Tobacco Use Disorder: Stopped smoking in November 2021 but now back smoking a few cigarettes daily. Needs PFTs, these have been ordered. 4. Cocaine Use Disorder:  UDS + on 12/2020. He cut back and EF improved on echo.  - Last used a few days ago. Encouraged abstinence.  5. CKD 3: Check BMET today  6. Hyperlipidemia: well controlled. LP 9/22 w/ LDL 40 mg/dL.  - Continue atorvastatin 80 mg daily + Zetia.. 7. Depression: He has been referred to psychiatry by PCP, however they were unable to get in contact with him. Number updated in chart, will re-refer. - Continue Prozac 40 mg daily.  - He has thoughts of self-harm, but no active plan. Discussed ED precautions. 8. T2DM: management per PCP. - Did not tolerate Farxiga due to dizziness 9. 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 10. Medical non-compliance: H/o poor compliance and on-going substance abuse. Recent discharge from Paramedicine as living situation deemed unsafe for paramedics. - Seems to be doing better with medication compliance.  Follow up with APP in 4 weeks (add beta blocker) and Dr. Aundra Dubin in 8 weeks.  [place], FNP 12/08/21

## 2021-12-12 ENCOUNTER — Encounter (HOSPITAL_COMMUNITY): Payer: Medicaid Other

## 2021-12-12 ENCOUNTER — Other Ambulatory Visit (HOSPITAL_COMMUNITY): Payer: Self-pay | Admitting: Family Medicine

## 2021-12-12 ENCOUNTER — Other Ambulatory Visit: Payer: Self-pay

## 2021-12-12 MED ORDER — HYDROXYZINE HCL 25 MG PO TABS
25.0000 mg | ORAL_TABLET | Freq: Three times a day (TID) | ORAL | 2 refills | Status: DC | PRN
Start: 2021-12-12 — End: 2022-02-26
  Filled 2021-12-12 – 2022-02-08 (×2): qty 90, 30d supply, fill #0

## 2021-12-12 MED ORDER — FLUOXETINE HCL 40 MG PO CAPS
40.0000 mg | ORAL_CAPSULE | Freq: Every day | ORAL | 5 refills | Status: DC
  Filled 2021-12-12: qty 30, 30d supply, fill #0

## 2021-12-12 NOTE — Telephone Encounter (Signed)
Requesting refill

## 2021-12-15 ENCOUNTER — Telehealth (HOSPITAL_COMMUNITY): Payer: Self-pay | Admitting: Licensed Clinical Social Worker

## 2021-12-15 NOTE — Telephone Encounter (Signed)
Pt called CSW and requested help with a ride to echo on Tuesday through [employer]- ride arranged  Will continue to follow and assist as needed  Jorge Ny, [facility] Desk#: [phone] Cell#: [phone]

## 2021-12-18 ENCOUNTER — Other Ambulatory Visit: Payer: Self-pay

## 2021-12-19 ENCOUNTER — Ambulatory Visit (HOSPITAL_COMMUNITY)
Admission: RE | Admit: 2021-12-19 | Discharge: 2021-12-19 | Disposition: A | Discharge status: Home / Self Care | Payer: Self-pay | Source: Ambulatory Visit | Attending: Family Medicine | Admitting: Family Medicine

## 2021-12-19 ENCOUNTER — Other Ambulatory Visit: Payer: Self-pay

## 2021-12-19 DIAGNOSIS — I110 Hypertensive heart disease with heart failure: Secondary | ICD-10-CM | POA: Insufficient documentation

## 2021-12-19 DIAGNOSIS — I429 Cardiomyopathy, unspecified: Secondary | ICD-10-CM | POA: Insufficient documentation

## 2021-12-19 DIAGNOSIS — I5042 Chronic combined systolic (congestive) and diastolic (congestive) heart failure: Secondary | ICD-10-CM | POA: Insufficient documentation

## 2021-12-19 DIAGNOSIS — E119 Type 2 diabetes mellitus without complications: Secondary | ICD-10-CM | POA: Insufficient documentation

## 2021-12-19 DIAGNOSIS — F17210 Nicotine dependence, cigarettes, uncomplicated: Secondary | ICD-10-CM | POA: Insufficient documentation

## 2021-12-19 LAB — ECHOCARDIOGRAM COMPLETE
Area-P 1/2: 2.29 cm2
Calc EF: 40 %
S' Lateral: 4.8 cm
Single Plane A2C EF: 39.8 %
Single Plane A4C EF: 39.8 %

## 2021-12-19 NOTE — Progress Notes (Signed)
°  Echocardiogram 2D Echocardiogram has been performed.  Fidel Levy 12/19/2021, 3:54 PM

## 2021-12-25 ENCOUNTER — Telehealth (HOSPITAL_COMMUNITY): Payer: Self-pay | Admitting: Licensed Clinical Social Worker

## 2021-12-25 NOTE — Telephone Encounter (Signed)
CSW received call from pt requesting help getting his pill box refilled- states he can't see very well and isn't totally sure how to take his medications.  Not a candidate for paramedicine due to safety concerns with pt living environment.  CSW spoke with triage staff who will schedule pt for visit tomorrow afternoon to help fill pill box.  CSW scheduled ride through [employer] for 1:30pm tomorrow- pt aware  Jorge Ny, [facility] Desk#: [phone] Cell#: [phone]

## 2021-12-26 ENCOUNTER — Other Ambulatory Visit: Payer: Self-pay

## 2021-12-26 ENCOUNTER — Encounter (HOSPITAL_COMMUNITY): Payer: Self-pay

## 2021-12-26 ENCOUNTER — Ambulatory Visit (HOSPITAL_COMMUNITY): Admission: RE | Admit: 2021-12-26 | Payer: Medicaid Other | Source: Ambulatory Visit

## 2021-12-26 ENCOUNTER — Ambulatory Visit: Payer: Medicaid Other | Admitting: Pharmacist

## 2022-01-10 ENCOUNTER — Encounter: Payer: Self-pay | Admitting: Family Medicine

## 2022-01-10 ENCOUNTER — Other Ambulatory Visit: Payer: Self-pay

## 2022-01-10 ENCOUNTER — Ambulatory Visit: Payer: Self-pay | Attending: Family Medicine | Admitting: Family Medicine

## 2022-01-10 ENCOUNTER — Telehealth: Payer: Self-pay | Admitting: Family Medicine

## 2022-01-10 DIAGNOSIS — G4709 Other insomnia: Secondary | ICD-10-CM

## 2022-01-10 DIAGNOSIS — I110 Hypertensive heart disease with heart failure: Secondary | ICD-10-CM

## 2022-01-10 DIAGNOSIS — H538 Other visual disturbances: Secondary | ICD-10-CM

## 2022-01-10 DIAGNOSIS — E1121 Type 2 diabetes mellitus with diabetic nephropathy: Secondary | ICD-10-CM

## 2022-01-10 DIAGNOSIS — I5043 Acute on chronic combined systolic (congestive) and diastolic (congestive) heart failure: Secondary | ICD-10-CM

## 2022-01-10 NOTE — Progress Notes (Signed)
Virtual Visit via Telephone Note  I connected with [name], on 01/10/2022 at 2:11 PM by telephone due to the COVID-19 pandemic and verified that I am speaking with the correct person using two identifiers.   Consent: I discussed the limitations, risks, security and privacy concerns of performing an evaluation and management service by telephone and the availability of in person appointments. I also discussed with the patient that there may be a patient responsible charge related to this service. The patient expressed understanding and agreed to proceed.   Location of Patient: Home  Location of Provider: Home Office   Persons participating in Telemedicine visit: Conrad Zajkowski. Dr. Margarita Rana     History of Present Illness: Matthew [name]. is a 56 y.o. 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   He complains of being a little tired and has had insomnia since his wife passed in 02/2020. He has hydroxyzine on his medication list however he has not been taking it. He is also on fluoxetine for depression.  He had been referred to grief counseling previously and he has had some sessions. He states that he sometimes feels down.  He has not been checking his blood sugars as he does not have a glucometer.  Denies hypoglycemic episodes.  He has questions about ophthalmology referral for blurry vision which was placed last month but upon review of his chart it seems [facility] had called him several times and left messages on his phone which he states he is unaware of.  He does not have means of checking his blood pressures at home. Denies presence of chest pain or dyspnea and endorses  compliance with his medications. Past Medical History:  Diagnosis Date   Acute combined systolic and diastolic CHF, NYHA class 3 ([place])    a. 02/2014 Echo: EF 25-30%.   Cardiomyopathy ([place])    a. 02/2014 Echo: EF 25-30%, sev glob HK with inferolat HK->AK, mod conc LVH, Gr 2 DD, Mild MR, sev dil LA.   Depression    DM (diabetes mellitus) ([place])    High cholesterol    Hypertension    Morbid obesity (HCC)    Tobacco abuse    No Known Allergies  Current Outpatient Medications on File Prior to Visit  Medication Sig Dispense Refill   aspirin EC 81 MG tablet Take 1 tablet (81 mg total) by mouth daily. Swallow whole. (Patient not taking: Reported on 11/17/2021) 30 tablet 11   atorvastatin (LIPITOR) 80 MG tablet TAKE 1 TABLET BY MOUTH DAILY. 30 tablet 3   ezetimibe (ZETIA) 10 MG tablet Take 1 tablet (10 mg total) by mouth daily. 30 tablet 3   FLUoxetine (PROZAC) 40 MG capsule Take 1 capsule (40 mg total) by mouth daily. 30 capsule 5   furosemide (LASIX) 40 MG tablet Take 1 tablet (40 mg total) by mouth daily. 30 tablet 3   glucose blood (ONE TOUCH ULTRA TEST) test strip Use as instructed 30 each 12   hydrOXYzine (ATARAX) 25 MG tablet Take 1 tablet (25 mg total) by mouth 3 (three) times daily as needed for anxiety. 90 tablet 2   ibuprofen (ADVIL) 400 MG tablet Take 1 tablet (400  mg total) by mouth every 6 (six) hours as needed for headache or moderate pain. 30 tablet 0   isosorbide-hydrALAZINE (BIDIL) 20-37.5 MG tablet Take 1 tablet by mouth 3 (three) times daily. 90 tablet 11   sacubitril-valsartan (ENTRESTO) 49-51 MG Take 1 tablet by mouth 2 (two) times daily. 60 tablet 1   spironolactone (ALDACTONE) 25 MG tablet Take 0.5 tablets (12.5 mg total) by mouth daily. 45 tablet 3   timolol (TIMOPTIC) 0.5 % ophthalmic solution Place 1 drop into both eyes daily after breakfast.     [DISCONTINUED] lovastatin (MEVACOR) 20 MG tablet Take 2 tablets (40 mg total) by mouth at bedtime. 60 tablet 1   No current  facility-administered medications on file prior to visit.    ROS: See HPI  Observations/Objective: Awake, alert, oriented x3 Not in acute distress Dysphoric mood   CMP Latest Ref Rng & Units 11/27/2021 11/17/2021 11/01/2021  Glucose 70 - 99 mg/dL 118(H) 225(H) 185(H)  BUN 6 - 20 mg/dL 11 12 9   Creatinine 0.61 - 1.24 mg/dL 1.33(H) 1.38(H) 1.38(H)  Sodium 135 - 145 mmol/L 139 138 138  Potassium 3.5 - 5.1 mmol/L 4.0 3.5 3.5  Chloride 98 - 111 mmol/L 107 104 105  CO2 22 - 32 mmol/L 26 25 26   Calcium 8.9 - 10.3 mg/dL 8.8(L) 9.1 9.0  Total Protein 6.5 - 8.1 g/dL - - -  Total Bilirubin 0.3 - 1.2 mg/dL - - -  Alkaline Phos 38 - 126 U/L - - -  AST 15 - 41 U/L - - -  ALT 0 - 44 U/L - - -    Lipid Panel     Component Value Date/Time   CHOL 99 09/15/2021 1456   CHOL 144 03/07/2018 0834   TRIG 34 09/15/2021 1456   HDL 52 09/15/2021 1456   HDL 36 (L) 03/07/2018 0834   CHOLHDL 1.9 09/15/2021 1456   VLDL 7 09/15/2021 1456   LDLCALC 40 09/15/2021 1456   LDLCALC 73 03/07/2018 0834   LABVLDL 35 03/07/2018 0834    Lab Results  Component Value Date   HGBA1C 5.8 12/05/2021    Assessment and Plan: 1. Controlled type 2 diabetes mellitus with diabetic nephropathy, without long-term current use of insulin (HCC) Controlled with A1c of 5.8 We will send glucometer which is covered by his insurance to the pharmacy Continue current regimen Counseled on Diabetic diet, my plate method, 419 minutes of moderate intensity exercise/week Blood sugar logs with fasting goals of 80-120 mg/dl, random of less than 180 and in the event of sugars less than 60 mg/dl or greater than 400 mg/dl encouraged to notify the clinic. Advised on the need for annual eye exams, annual foot exams, Pneumonia vaccine.   2. Hypertensive heart disease with acute on chronic combined systolic and diastolic congestive heart failure (HCC) EF less than 20% Euvolemic Continue spironolactone, isosorbide, Entresto, statin He  was previously on [place] but it appears this was discontinued in 01/2021 at his cardiology visit last month due to dizziness.  3. Blurred vision, right eye I have provided him with the number to [facility] eye [facility] to call to schedule an appointment since they have had a hard time reaching him.  4. 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 The patient was provided an  opportunity to ask questions and all were answered. The patient agreed with the plan and demonstrated an understanding of the instructions.   The patient was advised to call back or seek an in-person evaluation if the symptoms worsen or if the condition fails to improve as anticipated.     I provided 13 minutes total of non-face-to-face time during this encounter.   Charlott Rakes, MD, FAAFP. [facility] and [place], [place]   01/10/2022, 2:11 PM

## 2022-01-10 NOTE — Telephone Encounter (Signed)
Can you please send appropriate glucometer to the pharmacy for this patient?.  Thanks.

## 2022-01-11 ENCOUNTER — Other Ambulatory Visit: Payer: Self-pay

## 2022-01-11 MED ORDER — TRUE METRIX METER W/DEVICE KIT
PACK | 0 refills | Status: DC
  Filled 2022-01-11 – 2022-02-08 (×2): qty 1, 30d supply, fill #0

## 2022-01-11 MED ORDER — TRUEPLUS LANCETS 28G MISC
2 refills | Status: DC
  Filled 2022-01-11: qty 100, 90d supply, fill #0
  Filled 2022-02-08: qty 100, 100d supply, fill #0

## 2022-01-11 MED ORDER — TRUE METRIX BLOOD GLUCOSE TEST VI STRP
ORAL_STRIP | 2 refills | Status: DC
  Filled 2022-01-11: qty 50, 50d supply, fill #0
  Filled 2022-02-08: qty 100, 100d supply, fill #0

## 2022-01-11 NOTE — Addendum Note (Signed)
Addended by: Daisy Blossom, Annie Main L on: 01/11/2022 09:20 AM   Modules accepted: Orders

## 2022-01-11 NOTE — Telephone Encounter (Signed)
Yes ma'am. Spoke to pharmacy this morning. [employer] supplies preferred. Rxs sent.

## 2022-01-15 ENCOUNTER — Other Ambulatory Visit: Payer: Self-pay

## 2022-01-15 ENCOUNTER — Encounter (HOSPITAL_COMMUNITY): Payer: Medicaid Other | Admitting: Cardiology

## 2022-01-19 ENCOUNTER — Other Ambulatory Visit: Payer: Self-pay

## 2022-02-08 ENCOUNTER — Other Ambulatory Visit: Payer: Self-pay

## 2022-02-09 ENCOUNTER — Other Ambulatory Visit: Payer: Self-pay

## 2022-02-09 ENCOUNTER — Telehealth (HOSPITAL_COMMUNITY): Payer: Self-pay | Admitting: Licensed Clinical Social Worker

## 2022-02-09 NOTE — Telephone Encounter (Signed)
Pt called to request help getting to the pharmacy to get medications and then to the heart failure clinic to get help with his pill box.  CSW arranged for pt to get help through RN visit with triage staff but when CSW called pt back to arrange transport pt reports he no longer wants to come today.    CSW attempted to discuss further with pt but he was very distracted and reports he is living in a hotel now and that's why he can't come.  Attempted to get clarification on this point but pt not answering questions appropriately- got off the phone with CSW to reportedly speak with pharmacy and did not call me again.  Jorge Ny, LCSW Clinical Social Worker [facility] Desk#: [phone] Cell#: [phone]

## 2022-02-14 ENCOUNTER — Other Ambulatory Visit: Payer: Self-pay

## 2022-02-14 ENCOUNTER — Telehealth: Payer: Self-pay | Admitting: Family Medicine

## 2022-02-14 ENCOUNTER — Telehealth (HOSPITAL_COMMUNITY): Payer: Self-pay | Admitting: Licensed Clinical Social Worker

## 2022-02-14 ENCOUNTER — Ambulatory Visit (HOSPITAL_COMMUNITY)
Admission: EM | Admit: 2022-02-14 | Discharge: 2022-02-15 | Disposition: A | Discharge status: Other Acute Inpatient Hospital | Payer: No Payment, Other | Attending: Family | Admitting: Family

## 2022-02-14 DIAGNOSIS — F1490 Cocaine use, unspecified, uncomplicated: Secondary | ICD-10-CM | POA: Insufficient documentation

## 2022-02-14 DIAGNOSIS — T43226A Underdosing of selective serotonin reuptake inhibitors, initial encounter: Secondary | ICD-10-CM | POA: Insufficient documentation

## 2022-02-14 DIAGNOSIS — Z79899 Other long term (current) drug therapy: Secondary | ICD-10-CM | POA: Insufficient documentation

## 2022-02-14 DIAGNOSIS — F332 Major depressive disorder, recurrent severe without psychotic features: Secondary | ICD-10-CM | POA: Diagnosis not present

## 2022-02-14 DIAGNOSIS — Z20822 Contact with and (suspected) exposure to covid-19: Secondary | ICD-10-CM | POA: Diagnosis not present

## 2022-02-14 DIAGNOSIS — T466X6A Underdosing of antihyperlipidemic and antiarteriosclerotic drugs, initial encounter: Secondary | ICD-10-CM | POA: Insufficient documentation

## 2022-02-14 DIAGNOSIS — I110 Hypertensive heart disease with heart failure: Secondary | ICD-10-CM | POA: Insufficient documentation

## 2022-02-14 DIAGNOSIS — T500X6A Underdosing of mineralocorticoids and their antagonists, initial encounter: Secondary | ICD-10-CM | POA: Insufficient documentation

## 2022-02-14 DIAGNOSIS — T501X6A Underdosing of loop [high-ceiling] diuretics, initial encounter: Secondary | ICD-10-CM | POA: Insufficient documentation

## 2022-02-14 DIAGNOSIS — I509 Heart failure, unspecified: Secondary | ICD-10-CM | POA: Insufficient documentation

## 2022-02-14 DIAGNOSIS — Z634 Disappearance and death of family member: Secondary | ICD-10-CM | POA: Insufficient documentation

## 2022-02-14 DIAGNOSIS — Z9989 Dependence on other enabling machines and devices: Secondary | ICD-10-CM | POA: Insufficient documentation

## 2022-02-14 DIAGNOSIS — Z91138 Patient's unintentional underdosing of medication regimen for other reason: Secondary | ICD-10-CM | POA: Insufficient documentation

## 2022-02-14 DIAGNOSIS — R45851 Suicidal ideations: Secondary | ICD-10-CM | POA: Insufficient documentation

## 2022-02-14 DIAGNOSIS — T43596A Underdosing of other antipsychotics and neuroleptics, initial encounter: Secondary | ICD-10-CM | POA: Insufficient documentation

## 2022-02-14 LAB — CBC WITH DIFFERENTIAL/PLATELET
Abs Immature Granulocytes: 0.01 10*3/uL (ref 0.00–0.07)
Basophils Absolute: 0 10*3/uL (ref 0.0–0.1)
Basophils Relative: 1 %
Eosinophils Absolute: 0 10*3/uL (ref 0.0–0.5)
Eosinophils Relative: 1 %
HCT: 44.3 % (ref 39.0–52.0)
Hemoglobin: 14.9 g/dL (ref 13.0–17.0)
Immature Granulocytes: 0 %
Lymphocytes Relative: 29 %
Lymphs Abs: 1.6 10*3/uL (ref 0.7–4.0)
MCH: 30.2 pg (ref 26.0–34.0)
MCHC: 33.6 g/dL (ref 30.0–36.0)
MCV: 89.7 fL (ref 80.0–100.0)
Monocytes Absolute: 0.5 10*3/uL (ref 0.1–1.0)
Monocytes Relative: 9 %
Neutro Abs: 3.5 10*3/uL (ref 1.7–7.7)
Neutrophils Relative %: 60 %
Platelets: 221 10*3/uL (ref 150–400)
RBC: 4.94 MIL/uL (ref 4.22–5.81)
RDW: 13.2 % (ref 11.5–15.5)
WBC: 5.7 10*3/uL (ref 4.0–10.5)
nRBC: 0 % (ref 0.0–0.2)

## 2022-02-14 LAB — COMPREHENSIVE METABOLIC PANEL
ALT: 10 U/L (ref 0–44)
AST: 15 U/L (ref 15–41)
Albumin: 4.1 g/dL (ref 3.5–5.0)
Alkaline Phosphatase: 54 U/L (ref 38–126)
Anion gap: 8 (ref 5–15)
BUN: 12 mg/dL (ref 6–20)
CO2: 29 mmol/L (ref 22–32)
Calcium: 9.3 mg/dL (ref 8.9–10.3)
Chloride: 104 mmol/L (ref 98–111)
Creatinine, Ser: 1.37 mg/dL — ABNORMAL HIGH (ref 0.61–1.24)
GFR, Estimated: >60 mL/min (ref >60)
Glucose, Bld: 87 mg/dL (ref 70–99)
Potassium: 3.4 mmol/L — ABNORMAL LOW (ref 3.5–5.1)
Sodium: 141 mmol/L (ref 135–145)
Total Bilirubin: 0.7 mg/dL (ref 0.3–1.2)
Total Protein: 7.1 g/dL (ref 6.5–8.1)

## 2022-02-14 LAB — POCT URINE DRUG SCREEN - MANUAL ENTRY (I-SCREEN)
POC Amphetamine UR: NOT DETECTED
POC Buprenorphine (BUP): NOT DETECTED
POC Cocaine UR: NOT DETECTED — AB
POC Marijuana UR: NOT DETECTED
POC Methadone UR: NOT DETECTED
POC Methamphetamine UR: NOT DETECTED
POC Morphine: NOT DETECTED
POC Oxazepam (BZO): NOT DETECTED
POC Oxycodone UR: NOT DETECTED
POC Secobarbital (BAR): NOT DETECTED

## 2022-02-14 LAB — LIPID PANEL
Cholesterol: 166 mg/dL (ref 0–200)
HDL: 54 mg/dL (ref >40)
LDL Cholesterol: 101 mg/dL — ABNORMAL HIGH (ref 0–99)
Total CHOL/HDL Ratio: 3.1 RATIO
Triglycerides: 57 mg/dL (ref <150)
VLDL: 11 mg/dL (ref 0–40)

## 2022-02-14 LAB — RESP PANEL BY RT-PCR (FLU A&B, COVID) ARPGX2
Influenza A by PCR: NEGATIVE
Influenza B by PCR: NEGATIVE
SARS Coronavirus 2 by RT PCR: NEGATIVE

## 2022-02-14 LAB — GLUCOSE, CAPILLARY: Glucose-Capillary: 78 mg/dL (ref 70–99)

## 2022-02-14 LAB — TSH: TSH: 0.882 u[IU]/mL (ref 0.350–4.500)

## 2022-02-14 LAB — MAGNESIUM: Magnesium: 2.1 mg/dL (ref 1.7–2.4)

## 2022-02-14 LAB — ETHANOL: Alcohol, Ethyl (B): <10 mg/dL (ref <10)

## 2022-02-14 LAB — POC SARS CORONAVIRUS 2 AG -  ED: SARS Coronavirus 2 Ag: NEGATIVE

## 2022-02-14 MED ORDER — ATORVASTATIN CALCIUM 40 MG PO TABS
80.0000 mg | ORAL_TABLET | Freq: Every day | ORAL | Status: DC
Start: 2022-02-14 — End: 2022-02-15
  Administered 2022-02-14 – 2022-02-15 (×2): 80 mg via ORAL
  Filled 2022-02-14 (×2): qty 2

## 2022-02-14 MED ORDER — SACUBITRIL-VALSARTAN 49-51 MG PO TABS
1.0000 | ORAL_TABLET | Freq: Two times a day (BID) | ORAL | Status: DC
  Administered 2022-02-14 – 2022-02-15 (×2): 1 via ORAL
  Filled 2022-02-14 (×7): qty 1

## 2022-02-14 MED ORDER — FLUOXETINE HCL 20 MG PO CAPS
40.0000 mg | ORAL_CAPSULE | Freq: Every day | ORAL | Status: DC
  Administered 2022-02-14 – 2022-02-15 (×2): 40 mg via ORAL
  Filled 2022-02-14 (×3): qty 2

## 2022-02-14 MED ORDER — SPIRONOLACTONE 25 MG PO TABS
12.5000 mg | ORAL_TABLET | Freq: Every day | ORAL | Status: DC
  Administered 2022-02-14 – 2022-02-15 (×2): 12.5 mg via ORAL
  Filled 2022-02-14: qty 0.5
  Filled 2022-02-14 (×2): qty 1
  Filled 2022-02-14 (×2): qty 0.5

## 2022-02-14 MED ORDER — TIMOLOL MALEATE 0.5 % OP SOLN
1.0000 [drp] | Freq: Every day | OPHTHALMIC | Status: DC
  Administered 2022-02-15: 1 [drp] via OPHTHALMIC
  Filled 2022-02-14: qty 5

## 2022-02-14 MED ORDER — EZETIMIBE 10 MG PO TABS
10.0000 mg | ORAL_TABLET | Freq: Every day | ORAL | Status: DC
  Administered 2022-02-14: 10 mg via ORAL
  Filled 2022-02-14 (×2): qty 1

## 2022-02-14 MED ORDER — TRAZODONE HCL 50 MG PO TABS
50.0000 mg | ORAL_TABLET | Freq: Every evening | ORAL | Status: DC | PRN
  Administered 2022-02-14: 50 mg via ORAL
  Filled 2022-02-14: qty 1

## 2022-02-14 MED ORDER — HYDROXYZINE HCL 25 MG PO TABS
25.0000 mg | ORAL_TABLET | Freq: Three times a day (TID) | ORAL | Status: DC | PRN
  Administered 2022-02-14: 25 mg via ORAL
  Filled 2022-02-14: qty 1

## 2022-02-14 MED ORDER — MAGNESIUM HYDROXIDE 400 MG/5ML PO SUSP: 30.0000 mL | Freq: Every day | ORAL | Status: DC | PRN

## 2022-02-14 MED ORDER — ISOSORB DINITRATE-HYDRALAZINE 20-37.5 MG PO TABS
1.0000 | ORAL_TABLET | Freq: Three times a day (TID) | ORAL | Status: DC
  Administered 2022-02-14 – 2022-02-15 (×2): 1 via ORAL
  Filled 2022-02-14 (×8): qty 1

## 2022-02-14 MED ORDER — ACETAMINOPHEN 325 MG PO TABS
650.0000 mg | ORAL_TABLET | Freq: Four times a day (QID) | ORAL | Status: DC | PRN
  Administered 2022-02-15: 650 mg via ORAL
  Filled 2022-02-14: qty 2

## 2022-02-14 MED ORDER — ALUM & MAG HYDROXIDE-SIMETH 200-200-20 MG/5ML PO SUSP: 30.0000 mL | ORAL | Status: DC | PRN

## 2022-02-14 MED ORDER — FUROSEMIDE 40 MG PO TABS
40.0000 mg | ORAL_TABLET | Freq: Every day | ORAL | Status: DC
  Administered 2022-02-14 – 2022-02-15 (×2): 40 mg via ORAL
  Filled 2022-02-14 (×2): qty 1
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
## 2022-02-14 NOTE — Progress Notes (Signed)
Patient has been denied by [facility] due to no appropriate beds. Patient meets [place] inpatient criteria per Beatriz Stallion, NP. Patient has been faxed out to the following facilities:  ? ?[facility]  [address] [place] [address] [phone]  ?[facility]  [address] [place] [address] [phone]  ?[facility]  [address] [place] [address] [phone]  ?[place] [place] [address] [phone]  ?[facility]  [place], [place] [address] [phone]  ?[facility]  [place], [place] [address] [phone]  ?[place] [address] [place] [address] [phone]  ?[facility]  [address] [place] [address] [phone]  ?[facility]  [address] [place]  [address] [phone]  ? ?Mariea Clonts, MSW, LCSW-A  ?2:54 PM 02/14/2022   ?

## 2022-02-14 NOTE — ED Notes (Signed)
Pt sleeping@this time. Breathing even and unlabored. Will continue to monitor for safety 

## 2022-02-14 NOTE — BH Assessment (Signed)
Presenting to [facility] with BHRT after calling mobile crisis. Pt reports SI with plan and command AH.  Pt is emergent ? ?

## 2022-02-14 NOTE — ED Notes (Signed)
Hourly check is done 30 minutes late due to me rooming another patient. ?

## 2022-02-14 NOTE — ED Notes (Signed)
Pt A&O x 4, no distress noted.  Resting at present, monitoring for safety. ?

## 2022-02-14 NOTE — Telephone Encounter (Signed)
Matthew [name],  ? ?Looks like this patient is at [facility]. Are we able to fax them a list of current medications? ?

## 2022-02-14 NOTE — Progress Notes (Signed)
Pt admitted to continuous assessment due SI with no plan. Pt verbally contracts for safety on the unit. Pt is alert and oriented. Pt is labile. Pt is ambulatory and is oriented to staff/unit. Pt endorses AH with command to hurt self. Pt endorses VH but will not elaborate. Staff will monitor for pt's safety. ?

## 2022-02-14 NOTE — ED Notes (Signed)
Pt was given hot meal and cranberry juice for dinner. ?

## 2022-02-14 NOTE — Telephone Encounter (Signed)
CSW missed 3 calls from pt this morning in rapid succession while I was facilitating a group meeting.  Returned pt call after meeting was complete and pt reports persistant suicidal ideation.  Pt had already reached out to mobile crisis team and was connected with a mental health specialist with [employer] who took pt to [facility] for evaluation which he is at currently. ? ?Provided their team my information in case they need assistance ? ?Jorge Ny, LCSW ?Clinical Social Worker ?[facility] ?Desk#: [phone] ?Cell#: [phone] ? ?

## 2022-02-14 NOTE — Telephone Encounter (Signed)
Beth, called from [facility], asking what meds patient is on. Please call back ?

## 2022-02-14 NOTE — ED Provider Notes (Signed)
Behavioral Health Admission H&P [facility] & OBS)  Date: 02/14/22 Patient Name: Matthew [name]. MRN: [id number] Chief Complaint:  Chief Complaint  Patient presents with   Suicidal   Auditory Hallucinations       Diagnoses:  Final diagnoses:  Severe episode of recurrent major depressive disorder, without psychotic features ([place])  Cocaine use    HPI: Patient presents voluntarily to [facility] behavioral health for walk-in assessment.  Patient was transported by Event organiser after telephoning mobile crisis to report suicidal ideations.   Cayson endorses passive suicidal ideations currently.  He states "I do not want to be here, I have been having suicidal thoughts since my wife passed away, I would be better off if I was not here, I am tired of hurting and crying all the time."  He reports he had a plan to complete suicide last night by "cutting my wrists with a razor." He is unable to contract for safety at this time.   Patient endorses depressive symptoms including, depressed mood, tearful episodes, decreased energy and fatigue, decreased sleep and appetite and suicidal ideations for two years.  Recent stressors include being evicted from a local hotel where he has resided for approximately two weeks. He recently agreed to sell his home, home buyer "dropped me off at the hotel, they are supposed to give me money to get a new place."  Chaddrick has been off of his medications including fluoxetine 40 mg daily for approximately 2 weeks.  He typically receives his medications through the mail, has not had access to medications since leaving his home.  Tahir has been diagnosed with major depressive disorder.  He is not followed by outpatient psychiatry.  He has been followed by a grief counseling intermittently over the last 2 years.  Last met with grief counselor approximately 1 month ago.  He endorses 1 previous inpatient psychiatric hospitalization, approximately 1 year ago, at [facility].  No  family mental health history reported.  Additional stressors include the death of Damier's wife, approximately 2 years ago.  Patient reports the anniversary of his wife's death upcoming on 02-18-23.  He reports feeling that his family have distanced themselves from him since the death of his wife 2 years ago.  Patient reports he was hospitalized for chronic congestive heart failure at the time of his wife's passing.  Patient shares that he feels he could have intervened if he had not been medically admitted when his wife became ill.  Patient's 2 children, a 14 year old son and 9 year old daughter, have resided with patient's brother-in-law since the death of their mother.  He shares that he has limited contact with the children related to his situation, situation includes substance use.  Cypher endorses chronic substance use since the death of his wife 2 years ago.  He reports he last used alcohol 5 days ago.  He typically consumes approximately 2 beers 1 time per month.  No history of alcohol-related seizure.  He also reports chronic use of crack cocaine.  Typically uses crack cocaine approximately 2 times per week, last use 2 days ago. He also uses marijuana daily. He would like to stop using all substances. Patient is insightful regarding substance use, states "I would like to change my life."  Additional stressors include legal concerns.  Shepard reports that he was charged with trafficking cocaine, maintaining a dwelling and other charges in August 2022.  He reports that he was not trafficking cocaine, but "had bad company."  He shares that he  was unable to afford to pay the bills in his home, allowed a person who sells cocaine to reside briefly in his home.  He reports he is currently assisted by court appointed attorney and has court date upcoming on March 26, 2022.   Patient is assessed face-to-face by nurse practitioner.  He is seated in assessment area, no acute distress.  Jader is alert and oriented,  pleasant and cooperative during assessment.  He presents with depressed mood, congruent affect. He endorses suicidal ideations, ongoing since the death of his wife two years ago. denies suicidal and homicidal ideations.  He denies history of suicide attempts, denies history of  non suicidal self-harm behavior. He denies homicidal ideations. He has normal speech and behavior.  He denies visual hallucinations.  Patient is able to converse coherently with goal-directed thoughts and no distractibility or preoccupation.  He denies paranoia.  Objectively there is no evidence of psychosis/mania or delusional thinking.  Patient resides in a hotel in [place]. He denies access to weapons. Patient endorses decreased sleep and appetite. Currently he has applied for disability benefits and is awaiting decision.   Quirino requires CPAP while sleeping. His CPAP machine is at hotel. He plans to call hotel, once admitted, to ask that belongings be put aside for him to pick up at a later date.   Patient offered support and encouragement.    PHQ 2-9:  [employer] Visit from 03/23/2021 in [facility] from 01/24/2021 in [facility] Office Visit from 02/19/2019 in [facility]  Thoughts that you would be better off dead, or of hurting yourself in some way Several days Not at all Several days  PHQ-9 Total Score '16 15 5       ' Flowsheet Row ED from 02/14/2022 in [facility] ED from 07/28/2021 in [facility] ED from 05/01/2021 in [place] High Risk Moderate Risk No Risk        Total Time spent with patient: 30 minutes  Musculoskeletal  Strength & Muscle Tone: within normal limits Gait & Station: normal Patient leans: N/A  Psychiatric Specialty Exam   Presentation General Appearance: Appropriate for Environment; Casual  Eye Contact:Good  Speech:Clear and Coherent; Normal Rate  Speech Volume:Normal  Handedness:Right   Mood and Affect  Mood:Depressed; Hopeless  Affect:Depressed   Thought Process  Thought Processes:Coherent; Goal Directed; Linear  Descriptions of Associations:Intact  Orientation:Full (Time, Place and Person)  Thought Content:Logical; WDL  Diagnosis of Schizophrenia or Schizoaffective disorder in past: No   Hallucinations:Hallucinations: Command; Auditory Description of Auditory Hallucinations: "I hear a voice telling me, "just end it, kill yourself"  Ideas of Reference:None  Suicidal Thoughts:Suicidal Thoughts: Yes, Passive SI Passive Intent and/or Plan: Without Intent; With Plan  Homicidal Thoughts:Homicidal Thoughts: No   Sensorium  Memory:Immediate Good; Recent Fair  Judgment:Intact  Insight:Shallow   Executive Functions  Concentration:Good  Attention Span:Good  [place]  Language:Good   Psychomotor Activity  Psychomotor Activity:Psychomotor Activity: Normal   Assets  Assets:Communication Skills; Desire for Improvement; Resilience   Sleep  Sleep:Sleep: Poor   Nutritional Assessment (For OBS and FBC admissions only) Has the patient had a weight loss or gain of 10 pounds or more in the last 3 months?: No Has the patient had a decrease in food intake/or appetite?: Yes Does the  patient have dental problems?: No Does the patient have eating habits or behaviors that may be indicators of an eating disorder including binging or inducing vomiting?: No Has the patient recently lost weight without trying?: 2.0 Has the patient been eating poorly because of a decreased appetite?: 1 Malnutrition Screening Tool Score: 3 Nutritional Assessment Referrals: Medication/Tx changes    Physical Exam Vitals and nursing note reviewed.  Constitutional:       Appearance: Normal appearance. He is well-developed.  HENT:     Head: Normocephalic and atraumatic.     Nose: Nose normal.  Cardiovascular:     Rate and Rhythm: Normal rate.  Pulmonary:     Effort: Pulmonary effort is normal.  Musculoskeletal:        General: Normal range of motion.  Skin:    General: Skin is warm and dry.  Neurological:     Mental Status: He is alert and oriented to person, place, and time.  Psychiatric:        Attention and Perception: Attention and perception normal.        Mood and Affect: Affect normal. Mood is depressed.        Speech: Speech normal.        Behavior: Behavior normal. Behavior is cooperative.        Thought Content: Thought content includes suicidal ideation.        Cognition and Memory: Cognition and memory normal.   Review of Systems  Constitutional: Negative.   HENT: Negative.    Eyes: Negative.   Respiratory: Negative.    Cardiovascular: Negative.   Gastrointestinal: Negative.   Genitourinary: Negative.   Musculoskeletal: Negative.   Skin: Negative.   Neurological: Negative.   Endo/Heme/Allergies: Negative.   Psychiatric/Behavioral:  Positive for depression, substance abuse and suicidal ideas.    Blood pressure (!) 186/126, pulse 81, temperature 98.4 F (36.9 C), temperature source Oral, resp. rate 20, SpO2 100 %. There is no height or weight on file to calculate BMI.  Past Psychiatric History: MDD, recurrent severe  Is the patient at risk to self? Yes  Has the patient been a risk to self in the past 6 months? No .    Has the patient been a risk to self within the distant past? No   Is the patient a risk to others? No   Has the patient been a risk to others in the past 6 months? No   Has the patient been a risk to others within the distant past? No   Past Medical History:  Past Medical History:  Diagnosis Date   Acute combined systolic and diastolic CHF, NYHA class 3 ([place])    a. 02/2014 Echo: EF 25-30%.   Cardiomyopathy  ([place])    a. 02/2014 Echo: EF 25-30%, sev glob HK with inferolat HK->AK, mod conc LVH, Gr 2 DD, Mild MR, sev dil LA.   Depression    DM (diabetes mellitus) ([place])    High cholesterol    Hypertension    Morbid obesity ([place])    Tobacco abuse     Past Surgical History:  Procedure Laterality Date   CATARACT EXTRACTION Right    LEFT HEART CATHETERIZATION WITH CORONARY ANGIOGRAM N/A 03/04/2014   Procedure: LEFT HEART CATHETERIZATION WITH CORONARY ANGIOGRAM;  Surgeon: Burnell Blanks, MD;  Location: [facility] CATH LAB;  Service: Cardiovascular;  Laterality: N/A;   TEE WITHOUT CARDIOVERSION N/A 02/04/2017   Procedure: TRANSESOPHAGEAL ECHOCARDIOGRAM (TEE);  Surgeon: Dorothy Spark, MD;  Location: [place];  Service: Cardiovascular;  Laterality: N/A;    Family History:  Family History  Problem Relation Age of Onset   Heart attack Father        died @ 70   Cervical cancer Mother        died @ 34    Social History:  Social History   Socioeconomic History   Marital status: Widowed    Spouse name: Not on file   Number of children: 2   Years of education: 14   Highest education level: Not on file  Occupational History   Occupation: [employer]  Tobacco Use   Smoking status: Former    Years: 20.00    Types: Cigarettes   Smokeless tobacco: Former    Quit date: 11/14/2020  Vaping Use   Vaping Use: Never used  Substance and Sexual Activity   Alcohol use: No   Drug use: No   Sexual activity: Yes  Other Topics Concern   Not on file  Social History Narrative   Lives with 2 young children in [place].  Wife died in May 08, 2020.  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   Left-handed   Caffeine: occasional tea   Social Determinants of Health   Financial Resource Strain: Not on file  Food Insecurity: Not on file  Transportation Needs: Not on file  Physical Activity: Not on file  Stress: Not on file  Social Connections: Not on file  Intimate Partner Violence:  Not on file    SDOH:  SDOH Screenings   Alcohol Screen: Not on file  Depression (PHQ2-9): Medium Risk   PHQ-2 Score: 16  Financial Resource Strain: Not on file  Food Insecurity: Not on file  Housing: Not on file  Physical Activity: Not on file  Social Connections: Not on file  Stress: Not on file  Tobacco Use: Medium Risk   Smoking Tobacco Use: Former   Smokeless Tobacco Use: Former   Passive Exposure: Not on file  Transportation Needs: Not on file    Last Labs:  Hospital Outpatient Visit on 12/19/2021  Component Date Value Ref Range Status   S' Lateral 12/19/2021 4.80  cm Final   Single Plane A4C EF 12/19/2021 39.8  % Final   Single Plane A2C EF 12/19/2021 39.8  % Final   Calc EF 12/19/2021 40.0  % Final   Area-P 1/2 12/19/2021 2.29  cm2 Final  Office Visit on 12/05/2021  Component Date Value Ref Range Status   POC Glucose 12/05/2021 96  70 - 99 mg/dl Final   HbA1c, POC (controlled diabetic ra* 12/05/2021 5.8  0.0 - 7.0 % Final  Hospital Outpatient Visit on 11/27/2021  Component Date Value Ref Range Status   Sodium 11/27/2021 139  135 - 145 mmol/L Final   Potassium 11/27/2021 4.0  3.5 - 5.1 mmol/L Final   Chloride 11/27/2021 107  98 - 111 mmol/L Final   CO2 11/27/2021 26  22 - 32 mmol/L Final   Glucose, Bld 11/27/2021 118 (H)  70 - 99 mg/dL Final   Glucose reference range applies only to samples taken after fasting for at least 8 hours.   BUN 11/27/2021 11  6 - 20 mg/dL Final   Creatinine, Ser 11/27/2021 1.33 (H)  0.61 - 1.24 mg/dL Final   Calcium 11/27/2021 8.8 (L)  8.9 - 10.3 mg/dL Final   GFR, Estimated 11/27/2021 >60  >60 mL/min Final   Comment: (NOTE) Calculated using the CKD-EPI Creatinine Equation (2021)  Anion gap 11/27/2021 6  5 - 15 Final   Performed at [facility], [place] [address] [place], [place] [address]  Hospital Outpatient Visit on 11/17/2021  Component Date Value Ref Range Status   Sodium 11/17/2021 138  135 - 145 mmol/L Final    Potassium 11/17/2021 3.5  3.5 - 5.1 mmol/L Final   Chloride 11/17/2021 104  98 - 111 mmol/L Final   CO2 11/17/2021 25  22 - 32 mmol/L Final   Glucose, Bld 11/17/2021 225 (H)  70 - 99 mg/dL Final   Glucose reference range applies only to samples taken after fasting for at least 8 hours.   BUN 11/17/2021 12  6 - 20 mg/dL Final   Creatinine, Ser 11/17/2021 1.38 (H)  0.61 - 1.24 mg/dL Final   Calcium 11/17/2021 9.1  8.9 - 10.3 mg/dL Final   GFR, Estimated 11/17/2021 >60  >60 mL/min Final   Comment: (NOTE) Calculated using the CKD-EPI Creatinine Equation (2021)    Anion gap 11/17/2021 9  5 - 15 Final   Performed at [facility], [place] [address] [place], [place] [address]  Hospital Outpatient Visit on 11/01/2021  Component Date Value Ref Range Status   Sodium 11/01/2021 138  135 - 145 mmol/L Final   Potassium 11/01/2021 3.5  3.5 - 5.1 mmol/L Final   Chloride 11/01/2021 105  98 - 111 mmol/L Final   CO2 11/01/2021 26  22 - 32 mmol/L Final   Glucose, Bld 11/01/2021 185 (H)  70 - 99 mg/dL Final   Glucose reference range applies only to samples taken after fasting for at least 8 hours.   BUN 11/01/2021 9  6 - 20 mg/dL Final   Creatinine, Ser 11/01/2021 1.38 (H)  0.61 - 1.24 mg/dL Final   Calcium 11/01/2021 9.0  8.9 - 10.3 mg/dL Final   GFR, Estimated 11/01/2021 >60  >60 mL/min Final   Comment: (NOTE) Calculated using the CKD-EPI Creatinine Equation (2021)    Anion gap 11/01/2021 7  5 - 15 Final   Performed at [facility], [place] [address] [place], [place] [address]   B Natriuretic Peptide 11/01/2021 81.3  0.0 - 100.0 pg/mL Final   Performed at [facility] [address] [place], [place] [address]  Hospital Outpatient Visit on 10/17/2021  Component Date Value Ref Range Status   Sodium 10/17/2021 136  135 - 145 mmol/L Final   Potassium 10/17/2021 3.7  3.5 - 5.1 mmol/L Final   Chloride 10/17/2021 107  98 - 111 mmol/L Final   CO2 10/17/2021 24  22 - 32 mmol/L Final    Glucose, Bld 10/17/2021 151 (H)  70 - 99 mg/dL Final   Glucose reference range applies only to samples taken after fasting for at least 8 hours.   BUN 10/17/2021 15  6 - 20 mg/dL Final   Creatinine, Ser 10/17/2021 1.91 (H)  0.61 - 1.24 mg/dL Final   Calcium 10/17/2021 9.0  8.9 - 10.3 mg/dL Final   GFR, Estimated 10/17/2021 41 (L)  >60 mL/min Final   Comment: (NOTE) Calculated using the CKD-EPI Creatinine Equation (2021)    Anion gap 10/17/2021 5  5 - 15 Final   Performed at [facility], [place] [address], [place], [place] [address]   B Natriuretic Peptide 10/17/2021 71.0  0.0 - 100.0 pg/mL Final   Performed at [facility] [address] [place], [place] [address]  Hospital Outpatient Visit on 09/15/2021  Component Date Value Ref Range Status   Sodium 09/15/2021 138  135 - 145 mmol/L Final   Potassium 09/15/2021 4.1  3.5 - 5.1 mmol/L Final   Chloride 09/15/2021 108  98 - 111 mmol/L Final   CO2 09/15/2021 24  22 - 32 mmol/L Final   Glucose, Bld 09/15/2021 117 (H)  70 - 99 mg/dL Final   Glucose reference range applies only to samples taken after fasting for at least 8 hours.   BUN 09/15/2021 16  6 - 20 mg/dL Final   Creatinine, Ser 09/15/2021 1.29 (H)  0.61 - 1.24 mg/dL Final   Calcium 09/15/2021 8.9  8.9 - 10.3 mg/dL Final   GFR, Estimated 09/15/2021 >60  >60 mL/min Final   Comment: (NOTE) Calculated using the CKD-EPI Creatinine Equation (2021)    Anion gap 09/15/2021 6  5 - 15 Final   Performed at [facility], [place] [address], [place], [place] [address]   B Natriuretic Peptide 09/15/2021 80.4  0.0 - 100.0 pg/mL Final   Performed at [facility] [address] [place], [place] [address]   Cholesterol 09/15/2021 99  0 - 200 mg/dL Final   Triglycerides 09/15/2021 34  <150 mg/dL Final   HDL 09/15/2021 52  >40 mg/dL Final   Total CHOL/HDL Ratio 09/15/2021 1.9  RATIO Final   VLDL 09/15/2021 7  0 - 40 mg/dL Final   LDL Cholesterol 09/15/2021 40  0 - 99 mg/dL  Final   Comment:        Total Cholesterol/HDL:CHD Risk Coronary Heart Disease Risk Table                     Men   Women  1/2 Average Risk   3.4   3.3  Average Risk       5.0   4.4  2 X Average Risk   9.6   7.1  3 X Average Risk  23.4   11.0        Use the calculated Patient Ratio above and the CHD Risk Table to determine the patient's CHD Risk.        ATP III CLASSIFICATION (LDL):  <100     mg/dL   Optimal  100-129  mg/dL   Near or Above                    Optimal  130-159  mg/dL   Borderline  160-189  mg/dL   High  >190     mg/dL   Very High Performed at [facility] [address], [place], [place] [address]     Allergies: Patient has no known allergies.  PTA Medications: (Not in a hospital admission)   Medical Decision Making  Patient reviewed with Dr Serafina Mitchell. Patient recommended for inpatient psychiatric admission. He remains voluntary at this time. Laboratory studies ordered including CBC, CMP, ethanol, A1c, hepatic function, lipid panel, magnesium, prolactin and TSH.  Urine drug screen order initiated.  EKG ordered. Current medications: -Acetaminophen 650 mg every 6 as needed/mild pain -Maalox 30 mL oral every 4 as needed/digestion -Magnesium hydroxide 30 mL daily as needed/mild constipation -Trazodone 50 mg nightly as needed/sleep  Restarted home medications, including: -Atorvastatin 80 mg daily -Ezitimibe 10 mg daily -Fluoxetine 40 mg daily -Furosemide 40 mg daily -Hydroxyzine 25 mg 3 times daily as needed/anxiety -Isosorbide-hydralazine 20-37.5 mg 1 tablet 3 times daily -Sacubitril-valsartan 45-51 mg/tab. 1 tablet 2 times daily -Spironolactone 12.5 mg daily -Timolol  0.5% ophthalmic solution 1 drop both eyes daily after breakfast     Recommendations  Based on my evaluation the patient does not appear to have an emergency medical condition.  Lucky Rathke, FNP 02/14/22  12:51 PM

## 2022-02-15 ENCOUNTER — Other Ambulatory Visit: Payer: Self-pay

## 2022-02-15 ENCOUNTER — Inpatient Hospital Stay (HOSPITAL_COMMUNITY)
Admission: AD | Admit: 2022-02-15 | Discharge: 2022-02-26 | DRG: 885 | Disposition: A | Discharge status: Home / Self Care | Payer: Federal, State, Local not specified - Other | Source: Intra-hospital | Attending: Psychiatry | Admitting: Psychiatry

## 2022-02-15 ENCOUNTER — Encounter (HOSPITAL_COMMUNITY): Payer: Self-pay | Admitting: Family

## 2022-02-15 DIAGNOSIS — T50916A Underdosing of multiple unspecified drugs, medicaments and biological substances, initial encounter: Secondary | ICD-10-CM | POA: Diagnosis present

## 2022-02-15 DIAGNOSIS — Z5986 Financial insecurity: Secondary | ICD-10-CM

## 2022-02-15 DIAGNOSIS — Z8249 Family history of ischemic heart disease and other diseases of the circulatory system: Secondary | ICD-10-CM

## 2022-02-15 DIAGNOSIS — F333 Major depressive disorder, recurrent, severe with psychotic symptoms: Principal | ICD-10-CM

## 2022-02-15 DIAGNOSIS — E119 Type 2 diabetes mellitus without complications: Secondary | ICD-10-CM | POA: Diagnosis present

## 2022-02-15 DIAGNOSIS — G4730 Sleep apnea, unspecified: Secondary | ICD-10-CM | POA: Diagnosis present

## 2022-02-15 DIAGNOSIS — Z5900 Homelessness unspecified: Secondary | ICD-10-CM

## 2022-02-15 DIAGNOSIS — Z599 Problem related to housing and economic circumstances, unspecified: Secondary | ICD-10-CM

## 2022-02-15 DIAGNOSIS — I5042 Chronic combined systolic (congestive) and diastolic (congestive) heart failure: Secondary | ICD-10-CM | POA: Diagnosis present

## 2022-02-15 DIAGNOSIS — Z9841 Cataract extraction status, right eye: Secondary | ICD-10-CM

## 2022-02-15 DIAGNOSIS — H4020X0 Unspecified primary angle-closure glaucoma, stage unspecified: Secondary | ICD-10-CM | POA: Diagnosis present

## 2022-02-15 DIAGNOSIS — Z87891 Personal history of nicotine dependence: Secondary | ICD-10-CM

## 2022-02-15 DIAGNOSIS — I110 Hypertensive heart disease with heart failure: Secondary | ICD-10-CM | POA: Diagnosis present

## 2022-02-15 DIAGNOSIS — I69354 Hemiplegia and hemiparesis following cerebral infarction affecting left non-dominant side: Secondary | ICD-10-CM

## 2022-02-15 DIAGNOSIS — F419 Anxiety disorder, unspecified: Secondary | ICD-10-CM | POA: Diagnosis present

## 2022-02-15 DIAGNOSIS — I69322 Dysarthria following cerebral infarction: Secondary | ICD-10-CM

## 2022-02-15 DIAGNOSIS — Z91120 Patient's intentional underdosing of medication regimen due to financial hardship: Secondary | ICD-10-CM

## 2022-02-15 DIAGNOSIS — E876 Hypokalemia: Secondary | ICD-10-CM | POA: Diagnosis present

## 2022-02-15 DIAGNOSIS — F332 Major depressive disorder, recurrent severe without psychotic features: Secondary | ICD-10-CM | POA: Diagnosis present

## 2022-02-15 DIAGNOSIS — R45851 Suicidal ideations: Secondary | ICD-10-CM | POA: Diagnosis present

## 2022-02-15 DIAGNOSIS — E7800 Pure hypercholesterolemia, unspecified: Secondary | ICD-10-CM | POA: Diagnosis present

## 2022-02-15 DIAGNOSIS — G4700 Insomnia, unspecified: Secondary | ICD-10-CM | POA: Diagnosis present

## 2022-02-15 LAB — PROLACTIN: Prolactin: 4.8 ng/mL (ref 4.0–15.2)

## 2022-02-15 MED ORDER — EZETIMIBE 10 MG PO TABS
10.0000 mg | ORAL_TABLET | Freq: Every day | ORAL | Status: DC
Start: 2022-02-15 — End: 2022-02-26
  Administered 2022-02-15 – 2022-02-26 (×12): 10 mg via ORAL
  Filled 2022-02-15: qty 7
  Filled 2022-02-15 (×15): qty 1

## 2022-02-15 MED ORDER — TIMOLOL MALEATE 0.5 % OP SOLN
1.0000 [drp] | Freq: Every day | OPHTHALMIC | Status: DC
  Administered 2022-02-16 – 2022-02-26 (×11): 1 [drp] via OPHTHALMIC
  Filled 2022-02-15: qty 5

## 2022-02-15 MED ORDER — ACETAMINOPHEN 325 MG PO TABS
650.0000 mg | ORAL_TABLET | Freq: Four times a day (QID) | ORAL | Status: DC | PRN
  Administered 2022-02-16 – 2022-02-25 (×13): 650 mg via ORAL
  Filled 2022-02-15 (×13): qty 2

## 2022-02-15 MED ORDER — FLUOXETINE HCL 20 MG PO CAPS
40.0000 mg | ORAL_CAPSULE | Freq: Every day | ORAL | Status: DC
  Administered 2022-02-16: 40 mg via ORAL
  Filled 2022-02-15 (×3): qty 2

## 2022-02-15 MED ORDER — ASPIRIN EC 81 MG PO TBEC
81.0000 mg | DELAYED_RELEASE_TABLET | Freq: Every day | ORAL | Status: DC
  Administered 2022-02-16 – 2022-02-26 (×11): 81 mg via ORAL
  Filled 2022-02-15: qty 7
  Filled 2022-02-15 (×14): qty 1

## 2022-02-15 MED ORDER — TRAZODONE HCL 50 MG PO TABS
50.0000 mg | ORAL_TABLET | Freq: Every evening | ORAL | Status: DC | PRN
  Administered 2022-02-16: 50 mg via ORAL
  Filled 2022-02-15: qty 1

## 2022-02-15 MED ORDER — MAGNESIUM HYDROXIDE 400 MG/5ML PO SUSP: 30.0000 mL | Freq: Every day | ORAL | Status: DC | PRN

## 2022-02-15 MED ORDER — FUROSEMIDE 40 MG PO TABS
40.0000 mg | ORAL_TABLET | Freq: Every day | ORAL | Status: DC
  Administered 2022-02-16 – 2022-02-26 (×11): 40 mg via ORAL
  Filled 2022-02-15 (×9): qty 1
  Filled 2022-02-15: qty 7
  Filled 2022-02-15 (×4): qty 1

## 2022-02-15 MED ORDER — SPIRONOLACTONE 25 MG PO TABS
12.5000 mg | ORAL_TABLET | Freq: Every day | ORAL | Status: DC
  Administered 2022-02-16 – 2022-02-26 (×11): 12.5 mg via ORAL
  Filled 2022-02-15 (×7): qty 1
  Filled 2022-02-15: qty 4
  Filled 2022-02-15 (×6): qty 1

## 2022-02-15 MED ORDER — HYDROXYZINE HCL 25 MG PO TABS: 25.0000 mg | ORAL_TABLET | Freq: Three times a day (TID) | ORAL | Status: DC | PRN

## 2022-02-15 MED ORDER — ISOSORB DINITRATE-HYDRALAZINE 20-37.5 MG PO TABS
1.0000 | ORAL_TABLET | Freq: Three times a day (TID) | ORAL | Status: DC
  Filled 2022-02-15: qty 1

## 2022-02-15 MED ORDER — ATORVASTATIN CALCIUM 40 MG PO TABS
80.0000 mg | ORAL_TABLET | Freq: Every day | ORAL | Status: DC
  Administered 2022-02-16 – 2022-02-26 (×11): 80 mg via ORAL
  Filled 2022-02-15 (×3): qty 1
  Filled 2022-02-15: qty 14
  Filled 2022-02-15 (×10): qty 1

## 2022-02-15 MED ORDER — ALUM & MAG HYDROXIDE-SIMETH 200-200-20 MG/5ML PO SUSP: 30.0000 mL | ORAL | Status: DC | PRN

## 2022-02-15 MED ORDER — CLONIDINE HCL 0.1 MG PO TABS
0.1000 mg | ORAL_TABLET | Freq: Once | ORAL | Status: AC
  Administered 2022-02-15: 0.1 mg via ORAL
  Filled 2022-02-15 (×2): qty 1

## 2022-02-15 MED ORDER — ISOSORB DINITRATE-HYDRALAZINE 20-37.5 MG PO TABS
1.0000 | ORAL_TABLET | Freq: Three times a day (TID) | ORAL | Status: DC
  Administered 2022-02-16 – 2022-02-26 (×31): 1 via ORAL
  Filled 2022-02-15 (×13): qty 1
  Filled 2022-02-15: qty 21
  Filled 2022-02-15 (×14): qty 1
  Filled 2022-02-15: qty 21
  Filled 2022-02-15 (×7): qty 1
  Filled 2022-02-15: qty 21
  Filled 2022-02-15 (×8): qty 1

## 2022-02-15 MED ORDER — HYDROXYZINE HCL 25 MG PO TABS
25.0000 mg | ORAL_TABLET | Freq: Three times a day (TID) | ORAL | Status: DC | PRN
  Administered 2022-02-16 – 2022-02-25 (×9): 25 mg via ORAL
  Filled 2022-02-15 (×2): qty 1
  Filled 2022-02-15: qty 10
  Filled 2022-02-15 (×7): qty 1

## 2022-02-15 MED ORDER — SACUBITRIL-VALSARTAN 49-51 MG PO TABS
1.0000 | ORAL_TABLET | Freq: Two times a day (BID) | ORAL | Status: DC
  Administered 2022-02-17 – 2022-02-26 (×16): 1 via ORAL
  Filled 2022-02-15 (×15): qty 1
  Filled 2022-02-15: qty 7
  Filled 2022-02-15 (×5): qty 1
  Filled 2022-02-15: qty 7
  Filled 2022-02-15 (×7): qty 1

## 2022-02-15 NOTE — ED Notes (Signed)
Pt sleeping@this time. Breathing even and unlabored. Will continue to monitor for safety 

## 2022-02-15 NOTE — Progress Notes (Signed)
Patient is alert and oriented x 3, with passive SI. Patient received scheduled medications and PRN dose of Tylenol for a headache.  Patient states he can not see well and usually takes eyedrops. RN sent note to pharmacy about missing dose. Patient was able to eat breakfast and given graham crackers as a snack. Patient now resting in recline chair. Patient is calm and cooperative on the unit/ able to preform own ADLs. No acute behaviors observed thus far. Nursing staff will continue to monitor. ?

## 2022-02-15 NOTE — Progress Notes (Signed)
Psychoeducational Group Note ? ?Date:  02/15/2022 ?Time:  2015 ? ?Group Topic/Focus:  ?Wrap up group ? ?Participation Level: Did Not Attend ? ?Participation Quality:  Not Applicable ? ?Affect:  Not Applicable ? ?Cognitive:  Not Applicable ? ?Insight:  Not Applicable ? ?Engagement in Group: Not Applicable ? ?Additional Comments:  Did not attend.  ? ?Winfield Rast S ?02/15/2022, 9:02 PM ? ?

## 2022-02-15 NOTE — ED Notes (Signed)
Patient transferred to [facility] voluntarily. Original consent and eye drops sent. Patient received all belongings from [facility] locker room, and transported via [name]. ?

## 2022-02-15 NOTE — Progress Notes (Signed)
Vital Statistics reassessed by RN. BP: 143/93, temperature: 98.4, Pulse of 64 and Respirations of 18, patient is 98% o2 stats on room air. Patient laying comfortably in reclined chair. RN asked patient if he can have someone bring his CPAP machine from hotel patient states, "no" at this time. ?

## 2022-02-15 NOTE — Plan of Care (Signed)
Nurse discussed coping skills with patient.  

## 2022-02-15 NOTE — Progress Notes (Signed)
?   02/15/22 2100  ?Psych Admission Type (Psych Patients Only)  ?Admission Status Voluntary  ?Psychosocial Assessment  ?Patient Complaints Self-harm thoughts  ?Eye Contact Fair  ?Facial Expression Sad  ?Affect Anxious  ?Speech Logical/coherent  ?Interaction Assertive  ?Motor Activity Slow  ?Appearance/Hygiene Disheveled  ?Behavior Characteristics Anxious  ?Mood Labile  ?Aggressive Behavior  ?Effect No apparent injury  ?Thought Process  ?Coherency WDL  ?Content WDL  ?Delusions WDL  ?Perception Hallucinations  ?Hallucination Auditory;Visual  ?Judgment Impaired  ?Confusion None  ?Danger to Self  ?Current suicidal ideation? Passive  ?Self-Injurious Behavior Some self-injurious ideation observed or expressed.  No lethal plan expressed   ?Agreement Not to Harm Self Yes  ?Description of Agreement verbal contract for safety  ?Danger to Others  ?Danger to Others None reported or observed  ? ? ?

## 2022-02-15 NOTE — Progress Notes (Addendum)
Patient is 56 yrs old, voluntary, brought into ED by [facility].   Patient called [facility] because he felt suicidal.  Wife died 2 yrs ago this month.  He's tired of hurting.  Last night he took a razor to his wrists.  Would not contract at [facility].  Decreased appetite, tearful, decreased energy.  Evicted from hotel.  Homeless, no family to help him.  Last job   2 yrs ago.  Wife was diabetic, no children.  When wife died, patient lost his mind.  Denied tobacco,  THC, alcohol.  Has used cocaine in the past, last Saturday used cocaine.  Denied heroin use.  Rated anxiety, depression and hopeful 10.  Passive SI during admission, no plan, contracts for safety.  Denied HI.  Does see people, hears voices "end my life".  Was suicidal this week, had thoughts to cut himself. ?Has not lost weight, does have trouble concentrating.  Denied physical, verbal and sexual abuse.  Does have some college.   ?High fall risk, fall risk information given. ?Patient oriented to unit, food/drink given patient. ? ?Skin assessment:  Second toe on L foot, toe jammed, toenail needs cutting.  Arms, legs, hands, all look good. ? ? ?

## 2022-02-15 NOTE — ED Notes (Signed)
Pt was given breakfast and juice. ?

## 2022-02-15 NOTE — Progress Notes (Signed)
Patient refused Ezetimibe 10mg  tablet this morning stating he only takes lipitor/  asorvastatin for his cholesterol.  ?

## 2022-02-15 NOTE — Progress Notes (Signed)
?  Nursing report called to [facility] [place] at [facility]. ?Safe transport called. ?

## 2022-02-15 NOTE — Progress Notes (Signed)
RN attempted to call report, and was told to call back around 1 pm due to business  of the unit. AC  at [facility] informed via secure chat. ?

## 2022-02-15 NOTE — Telephone Encounter (Signed)
We don't have a medical release on file for patient records to be sent. Phone number listed with encounter is patient's phone number and phone is going to VM. ?

## 2022-02-16 ENCOUNTER — Encounter (HOSPITAL_COMMUNITY): Payer: Self-pay

## 2022-02-16 DIAGNOSIS — F333 Major depressive disorder, recurrent, severe with psychotic symptoms: Secondary | ICD-10-CM

## 2022-02-16 MED ORDER — MELATONIN 5 MG PO TABS
5.0000 mg | ORAL_TABLET | Freq: Every day | ORAL | Status: DC
  Administered 2022-02-16 – 2022-02-25 (×10): 5 mg via ORAL
  Filled 2022-02-16 (×2): qty 1
  Filled 2022-02-16: qty 7
  Filled 2022-02-16 (×11): qty 1

## 2022-02-16 MED ORDER — QUETIAPINE FUMARATE 50 MG PO TABS
50.0000 mg | ORAL_TABLET | Freq: Every day | ORAL | Status: DC
  Administered 2022-02-16: 50 mg via ORAL
  Filled 2022-02-16 (×3): qty 1

## 2022-02-16 MED ORDER — SERTRALINE HCL 25 MG PO TABS
25.0000 mg | ORAL_TABLET | Freq: Every day | ORAL | Status: DC
  Administered 2022-02-17: 25 mg via ORAL
  Filled 2022-02-16 (×4): qty 1

## 2022-02-16 NOTE — BH IP Treatment Plan (Signed)
Interdisciplinary Treatment and Diagnostic Plan Update ? ?02/16/2022 ?Matthew [name]. ?MRN: [id number] ? ?Principal Diagnosis: MDD (major depressive disorder), recurrent episode, severe ([place]) ? ?Secondary Diagnoses: Principal Problem: ?  MDD (major depressive disorder), recurrent episode, severe ([place]) ? ? ?Current Medications:  ?Current Facility-Administered Medications  ?Medication Dose Route Frequency Provider Last Rate Last Admin  ? acetaminophen (TYLENOL) tablet 650 mg  650 mg Oral Q6H PRN Lucky Rathke, FNP   650 mg at 02/16/22 5993  ? alum & mag hydroxide-simeth (MAALOX/MYLANTA) 200-200-20 MG/5ML suspension 30 mL  30 mL Oral Q4H PRN Lucky Rathke, FNP      ? aspirin EC tablet 81 mg  81 mg Oral Daily Lucky Rathke, FNP   81 mg at 02/16/22 5701  ? atorvastatin (LIPITOR) tablet 80 mg  80 mg Oral Daily Lucky Rathke, FNP   80 mg at 02/16/22 7793  ? ezetimibe (ZETIA) tablet 10 mg  10 mg Oral Daily Lucky Rathke, FNP   10 mg at 02/16/22 0813  ? FLUoxetine (PROZAC) capsule 40 mg  40 mg Oral Daily Lucky Rathke, FNP   40 mg at 02/16/22 9030  ? furosemide (LASIX) tablet 40 mg  40 mg Oral Daily Lucky Rathke, FNP   40 mg at 02/16/22 0923  ? hydrOXYzine (ATARAX) tablet 25 mg  25 mg Oral TID PRN Lucky Rathke, FNP   25 mg at 02/16/22 0817  ? isosorbide-hydrALAZINE (BIDIL) 20-37.5 MG per tablet 1 tablet  1 tablet Oral TID Minda Ditto, RPH   1 tablet at 02/16/22 3007  ? magnesium hydroxide (MILK OF MAGNESIA) suspension 30 mL  30 mL Oral Daily PRN Lucky Rathke, FNP      ? sacubitril-valsartan (ENTRESTO) 49-51 mg per tablet  1 tablet Oral BID Lucky Rathke, FNP      ? spironolactone (ALDACTONE) tablet 12.5 mg  12.5 mg Oral Daily Lucky Rathke, FNP   12.5 mg at 02/16/22 0813  ? timolol (TIMOPTIC) 0.5 % ophthalmic solution 1 drop  1 drop Both Eyes QPC breakfast Lucky Rathke, FNP   1 drop at 02/16/22 6226  ? traZODone (DESYREL) tablet 50 mg  50 mg Oral QHS PRN Lucky Rathke, FNP   50 mg at 02/16/22 0036  ? ?PTA  Medications: ?Medications Prior to Admission  ?Medication Sig Dispense Refill Last Dose  ? aspirin EC 81 MG tablet Take 1 tablet (81 mg total) by mouth daily. Swallow whole. (Patient not taking: Reported on 11/17/2021) 30 tablet 11   ? atorvastatin (LIPITOR) 80 MG tablet TAKE 1 TABLET BY MOUTH DAILY. 30 tablet 3   ? ezetimibe (ZETIA) 10 MG tablet Take 1 tablet (10 mg total) by mouth daily. 30 tablet 3   ? FLUoxetine (PROZAC) 40 MG capsule Take 1 capsule (40 mg total) by mouth daily. 30 capsule 5   ? furosemide (LASIX) 40 MG tablet Take 1 tablet (40 mg total) by mouth daily. 30 tablet 3   ? hydrOXYzine (ATARAX) 25 MG tablet Take 1 tablet (25 mg total) by mouth 3 (three) times daily as needed for anxiety. 90 tablet 2   ? isosorbide-hydrALAZINE (BIDIL) 20-37.5 MG tablet Take 1 tablet by mouth 3 (three) times daily. 90 tablet 11   ? sacubitril-valsartan (ENTRESTO) 49-51 MG Take 1 tablet by mouth 2 (two) times daily. 60 tablet 1   ? spironolactone (ALDACTONE) 25 MG tablet Take 0.5 tablets (12.5 mg total) by mouth daily. 45 tablet 3   ?  timolol (TIMOPTIC) 0.5 % ophthalmic solution Place 1 drop into both eyes daily after breakfast.     ? ? ?Patient Stressors:   ? ?Patient Strengths:   ? ?Treatment Modalities: Medication Management, Group therapy, Case management,  ?1 to 1 session with clinician, Psychoeducation, Recreational therapy. ? ? ?Physician Treatment Plan for Primary Diagnosis: MDD (major depressive disorder), recurrent episode, severe ([place]) ?Long Term Goal(s):    ? ?Short Term Goals:   ? ?Medication Management: Evaluate patient's response, side effects, and tolerance of medication regimen. ? ?Therapeutic Interventions: 1 to 1 sessions, Unit Group sessions and Medication administration. ? ?Evaluation of Outcomes: Not Met ? ?Physician Treatment Plan for Secondary Diagnosis: Principal Problem: ?  MDD (major depressive disorder), recurrent episode, severe ([place]) ? ?Long Term Goal(s):    ? ?Short Term Goals:       ? ?Medication Management: Evaluate patient's response, side effects, and tolerance of medication regimen. ? ?Therapeutic Interventions: 1 to 1 sessions, Unit Group sessions and Medication administration. ? ?Evaluation of Outcomes: Not Met ? ? ?RN Treatment Plan for Primary Diagnosis: MDD (major depressive disorder), recurrent episode, severe ([place]) ?Long Term Goal(s): Knowledge of disease and therapeutic regimen to maintain health will improve ? ?Short Term Goals: Ability to disclose and discuss suicidal ideas, Ability to identify and develop effective coping behaviors will improve, and Compliance with prescribed medications will improve ? ?Medication Management: RN will administer medications as ordered by provider, will assess and evaluate patient's response and provide education to patient for prescribed medication. RN will report any adverse and/or side effects to prescribing provider. ? ?Therapeutic Interventions: 1 on 1 counseling sessions, Psychoeducation, Medication administration, Evaluate responses to treatment, Monitor vital signs and CBGs as ordered, Perform/monitor CIWA, COWS, AIMS and Fall Risk screenings as ordered, Perform wound care treatments as ordered. ? ?Evaluation of Outcomes: Not Met ? ? ?LCSW Treatment Plan for Primary Diagnosis: MDD (major depressive disorder), recurrent episode, severe ([place]) ?Long Term Goal(s): Safe transition to appropriate next level of care at discharge, Engage patient in therapeutic group addressing interpersonal concerns. ? ?Short Term Goals: Engage patient in aftercare planning with referrals and resources, Increase social support, and Increase ability to appropriately verbalize feelings ? ?Therapeutic Interventions: Assess for all discharge needs, 1 to 1 time with Education officer, museum, Explore available resources and support systems, Assess for adequacy in community support network, Educate family and significant other(s) on suicide prevention, Complete Psychosocial  Assessment, Interpersonal group therapy. ? ?Evaluation of Outcomes: Not Met ? ? ?Progress in Treatment: ?Attending groups: No. and As evidenced by:  Pt was recently admitted ?Participating in groups: No. ?Taking medication as prescribed: Yes. ?Toleration medication: Yes. ?Family/Significant other contact made: No, will contact:  CSW will obtain consents ?Patient understands diagnosis: Yes. ?Discussing patient identified problems/goals with staff: Yes. ?Medical problems stabilized or resolved: Yes. ?Denies suicidal/homicidal ideation: Yes. ?Issues/concerns per patient self-inventory: No. ?Other: None ? ?New problem(s) identified: No, Describe:  None ? ?New Short Term/Long Term Goal(s):medication stabilization, elimination of SI thoughts, development of comprehensive mental wellness plan. ? ?Patient Goals:  "get better" ? ?Discharge Plan or Barriers: Patient recently admitted. CSW will continue to follow and assess for appropriate referrals and possible discharge planning.  ? ?Reason for Continuation of Hospitalization: Depression ?Medication stabilization ?Suicidal ideation ? ?Estimated Length of Stay: 3-5 days ? ? ?Scribe for Treatment Team: ?Eliott Nine ?02/16/2022 ?11:21 AM ?

## 2022-02-16 NOTE — BHH Suicide Risk Assessment (Addendum)
Suicide Risk Assessment  Admission Assessment    [facility] Admission Suicide Risk Assessment   Nursing information obtained from:  Patient Demographic factors:  Male, Low socioeconomic status, Unemployed, Divorced or widowed Current Mental Status:  Self-harm thoughts Loss Factors:  Decrease in vocational status, Loss of significant relationship, Financial problems / change in socioeconomic status Historical Factors:  Impulsivity Risk Reduction Factors:  NA  Total Time spent with patient: 1 hour Principal Problem: MDD (major depressive disorder), recurrent, severe, with psychosis ([place]) Diagnosis:  Principal Problem:   MDD (major depressive disorder), recurrent, severe, with psychosis ([place]) Active Problems:   MDD (major depressive disorder), recurrent episode, severe ([place])  History of Present Illness: Matthew [name] is a 56 y.o with male with a history of type 2 diabetes mellitus (A1c 5.8), Glaucoma, Hypertension, sleep apnea, CHF, 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   Continued Clinical Symptoms: Pt endorses worsening anhedonia, no motivation, feelings of worthlessness, helplessness, hopelessness, trouble concentration, restlessness, irritability, low energy levels, worsening anxiety, panic attacks, as well as recurrent thoughts of death for the past two years, worsening since January this year. Pt also endorses worsening auditory & visual hallucinations; states that he hears voices commanding in nature telling him to kill himself, and sees someone standing behind him all the time, states the last time that he saw the person was today when he went to use the restroom. He reports worsening paranoia, and thinks that people are out to get him all the time. Pt reports that paranoia started last summer  and has also worsened as his depressive symptoms have worsened. Continuous hospitalization is necessary to treat and stabilize pt's symptoms.  Alcohol Use Disorder Identification Test Final Score (AUDIT): 0 The "Alcohol Use Disorders Identification Test", Guidelines for Use in Primary Care, Second Edition.  [employer]). Score between 0-7:  no or low risk or alcohol related problems. Score between 8-15:  moderate risk of alcohol related problems. Score between 16-19:  high risk of alcohol related problems. Score 20 or above:  warrants further diagnostic evaluation for alcohol dependence and treatment.  CLINICAL FACTORS:   Panic Attacks Depression:   Anhedonia Hopelessness Insomnia Severe  Musculoskeletal: Strength & Muscle Tone: decreased Gait & Station: unsteady Patient leans: N/A  Psychiatric Specialty Exam:  Presentation  General Appearance: Disheveled  Eye Contact:Fair  Speech:Clear and Coherent  Speech Volume:Decreased  Handedness:Right   Mood and Affect  Mood:Anxious; Depressed  Affect:Blunt   Thought Process  Thought Processes:Coherent  Descriptions of Associations:Intact  Orientation:Full (Time, Place and Person)  Thought Content:Logical  History of Schizophrenia/Schizoaffective disorder:No  Duration of Psychotic Symptoms:No data recorded Hallucinations:Hallucinations: Auditory; Visual Description of Auditory Hallucinations: commanding in nature telling him to kill self  Ideas of Reference:Paranoia  Suicidal Thoughts:Suicidal Thoughts: Yes, Active SI Active Intent and/or Plan: Without Intent SI Passive Intent and/or Plan: Without Intent; Without Plan  Homicidal Thoughts:Homicidal Thoughts: No   Sensorium  Memory:Immediate Good; Recent Good; Remote Good  Judgment:Poor  Insight:Poor   Executive Functions  Concentration:Fair  Attention Span:Fair  Joseph   Psychomotor Activity  Psychomotor Activity:Psychomotor Activity: Decreased; Psychomotor Retardation   Assets  Assets:Communication Skills; Desire for Improvement   Sleep  Sleep:Sleep: Poor    Physical Exam: Physical Exam Constitutional:  Appearance: He is ill-appearing.  HENT:     Head: Normocephalic.     Nose: Nose normal.  Eyes:     Pupils: Pupils are equal, round, and reactive to light.  Pulmonary:     Effort: Pulmonary effort is normal.  Musculoskeletal:        General: Normal range of motion.  Neurological:     Mental Status: He is alert and oriented to person, place, and time.  Psychiatric:        Behavior: Behavior normal.   Review of Systems  Constitutional: Negative.  Negative for fever.  HENT: Negative.    Eyes: Negative.   Respiratory:  Negative for cough.   Cardiovascular:  Negative for chest pain.  Gastrointestinal: Negative.  Negative for heartburn and nausea.  Genitourinary: Negative.   Musculoskeletal:  Positive for myalgias.  Skin: Negative.   Neurological: Negative.  Negative for dizziness and headaches.  Endo/Heme/Allergies: Negative.   Psychiatric/Behavioral:  Positive for depression, hallucinations, substance abuse and suicidal ideas. The patient is nervous/anxious and has insomnia.   Blood pressure 125/80, pulse 77, temperature 97.6 F (36.4 C), temperature source Oral, resp. rate 16, height 5\' 9"  (1.753 m), weight 85.7 kg, SpO2 100 %. Body mass index is 27.91 kg/m.  COGNITIVE FEATURES THAT CONTRIBUTE TO RISK:  None    SUICIDE RISK:   Moderate:  Frequent suicidal ideation with limited intensity, and duration, some specificity in terms of plans, no associated intent, good self-control, limited dysphoria/symptomatology, some risk factors present, and identifiable protective factors, including available and accessible social support.  PLAN OF CARE:   Safety and Monitoring: Voluntary admission to  inpatient psychiatric unit for safety, stabilization and treatment Daily contact with patient to assess and evaluate symptoms and progress in treatment Patient's case to be discussed in multi-disciplinary team meeting Observation Level : q15 minute checks Vital signs: q12 hours Precautions: suicide, elopement, and assault   Long Term Goal(s): Improvement in symptoms so as ready for discharge   Short Term Goals: Ability to identify changes in lifestyle to reduce recurrence of condition will improve, Ability to verbalize feelings will improve, Ability to disclose and discuss suicidal ideas, Ability to demonstrate self-control will improve, Ability to identify and develop effective coping behaviors will improve, Compliance with prescribed medications will improve, and Ability to identify triggers associated with substance abuse/mental health issues will improve   Physician Treatment Plan for Secondary Diagnosis:  Principal Problem:   MDD (major depressive disorder), recurrent episode, severe ([facility])   Medications MDD with Psychotic Features -Discontinue Prozac -Start Zoloft 25 mg daily for depression (start 3/4 @ 0800) -Start Seroquel 50 mg nightly for psychosis (3/3 @ 2200)   Anxiety -Continue Hydroxyzine 25 mg every 6 hours PRN   Medical Medications Continue Lasix 40 mg daily Continue Bidil 20-37.5 mg daily Continue Entresto 49-51 mg daily Continue Aldactone 12.5 mg daily  Continue Timolol eye drops daily Continue Lipitor 80 mg daily Continue Zetia 10 mg daily   Other PRNS -Continue Tylenol 650 mg every 6 hours PRN for mild pain -Continue Maalox 30 mg every 4 hrs PRN for indigestion -Continue Milk of Magnesia as needed every 6 hrs for constipation -Discontinue Trazodone 50mg  nightly (h/o CHF) -Start Melatonin 5 mg nightly for insomnia   Discharge Planning: Social work and case management to assist with discharge planning and identification of hospital follow-up needs prior to  discharge Estimated LOS: 5-7 days Discharge Concerns: Need to establish a safety plan; Medication compliance and effectiveness Discharge Goals: Return home with  outpatient referrals for mental health follow-up including medication management/psychotherapy   I certify that inpatient services furnished can reasonably be expected to improve the patient's condition.   Nicholes Rough, NP 02/16/2022, 6:43 PM

## 2022-02-16 NOTE — Progress Notes (Addendum)
Pt denies HI but endorses SI and AVH and verbally agrees to approach staff if these become apparent or before harming themselves/others. Voices are telling him to hurt himself and he sees people and shadows. Rates depression 10/10. Rates anxiety 10/10. Rates pain 9/10.  Pt complained of "not feeling right" and was having tachypnea. V/s were taken and were within normal limits. Pt was having anxiety but was given PRN previously. Pt was able to slow his breathing and to go back into group. Scheduled medications administered to pt, per MD orders. RN provided support and encouragement to pt. Q15 min safety checks implemented and continued. Pt safe on the unit. RN will continue to monitor and intervene as needed.  ? 02/16/22 1610  ?Psych Admission Type (Psych Patients Only)  ?Admission Status Voluntary  ?Psychosocial Assessment  ?Patient Complaints Anxiety;Depression;Self-harm thoughts  ?Eye Contact Poor  ?Facial Expression Anxious;Sad  ?Affect Anxious;Depressed  ?Speech Logical/coherent  ?Interaction Assertive  ?Motor Activity Slow;Unsteady  ?Appearance/Hygiene In scrubs  ?Behavior Characteristics Cooperative;Anxious  ?Mood Anxious;Sad  ?Thought Process  ?Coherency WDL  ?Content WDL  ?Delusions WDL  ?Perception Hallucinations  ?Hallucination Auditory;Visual  ?Judgment Limited  ?Confusion None  ?Danger to Self  ?Current suicidal ideation? Passive  ?Self-Injurious Behavior Some self-injurious ideation observed or expressed.  No lethal plan expressed   ?Agreement Not to Harm Self Yes  ?Description of Agreement verbal  ?Danger to Others  ?Danger to Others None reported or observed  ? ? ?

## 2022-02-16 NOTE — BHH Group Notes (Signed)
Goals group: patient attended and contributed to group. ?

## 2022-02-16 NOTE — Progress Notes (Signed)
?   02/16/22 0500  ?Sleep  ?Number of Hours 8.5  ? ? ?

## 2022-02-16 NOTE — Group Note (Signed)
[place] LCSW Group Therapy Note ? ?Date/Time: ? ?Type of Therapy and Topic:  Group Therapy:  Who Am I?  Self Esteem, Self-Actualization and Understanding Self. ? ?Participation Level:  Did not attend ? ?Description of Group:   ? In this group patients will be asked to explore values, beliefs, truths, and morals as they relate to personal self.  Patients will be guided to discuss their thoughts, feelings, and behaviors related to what they identify as important to their true self. Patients will process together how values, beliefs and truths are connected to specific choices patients make every day. Each patient will be challenged to identify changes that they are motivated to make in order to improve self-esteem and self-actualization. This group will be process-oriented, with patients participating in exploration of their own experiences as well as giving and receiving support and challenge from other group members. ? ?Therapeutic Goals: ?Patient will identify false beliefs that currently interfere with their self-esteem.  ?Patient will identify feelings, thought process, and behaviors related to self and will become aware of the uniqueness of themselves and of others.  ?Patient will be able to identify and verbalize values, morals, and beliefs as they relate to self. ?Patient will begin to learn how to build self-esteem/self-awareness by expressing what is important and unique to them personally. ? ?Summary of Patient Progress: Did not attend ? ? ? ? ? ? ? ? ? ? ? ? ?Therapeutic Modalities:   ?Cognitive Behavioral Therapy ?Solution Focused Therapy ?Motivational Interviewing ?Brief Therapy ? ?Shanitha Twining, LCSW, LCAS ?Clincal Social Worker  ?[facility] ? ? ?

## 2022-02-16 NOTE — Progress Notes (Signed)
?   02/16/22 2200  ?Psych Admission Type (Psych Patients Only)  ?Admission Status Voluntary  ?Psychosocial Assessment  ?Patient Complaints Anxiety;Depression  ?Eye Contact Fair  ?Facial Expression Sad  ?Affect Sad  ?Speech Logical/coherent  ?Interaction Assertive  ?Motor Activity Slow  ?Appearance/Hygiene Disheveled  ?Behavior Characteristics Cooperative  ?Mood Sad  ?Aggressive Behavior  ?Effect No apparent injury  ?Thought Process  ?Coherency WDL  ?Content Paranoia  ?Delusions Paranoid  ?Perception Hallucinations  ?Hallucination Auditory;Visual  ?Judgment Limited  ?Confusion None  ?Danger to Self  ?Current suicidal ideation? Passive  ?Self-Injurious Behavior Some self-injurious ideation observed or expressed.  No lethal plan expressed   ?Agreement Not to Harm Self Yes  ?Description of Agreement verbal contract for safety  ?Danger to Others  ?Danger to Others None reported or observed  ? ? ?
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
## 2022-02-16 NOTE — Group Note (Signed)
Recreation Therapy Group Note ? ? ?Group Topic:Stress Management  ?Group Date: 02/16/2022 ?Start Time: 13 ?End Time: 4765 ?Facilitators: Victorino Sparrow, LRT,CTRS ?Location: [place] ? ? ?Goal Area(s) Addresses:  ?Patient will identify positive stress management techniques. ?Patient will identify benefits of using stress management post d/c. ? ?Group Description:  Meditation.  LRT played a mountain meditation which patients were encouraged to visualize a mountain and imagine taking on its qualities.  The meditation spoke of the mountain being able to stand through anything its faced with and still keep the qualities it has.  Patients were to listen and follow along as meditation played to fully engage in activity. ? ? ?Affect/Mood: Appropriate ?  ?Participation Level: Active ?  ?Participation Quality: Independent ?  ?Behavior: Attentive  ?  ?Speech/Thought Process: Focused ?  ?Insight: Good ?  ?Judgement: Good ?  ?Modes of Intervention: Meditation ?  ?Patient Response to Interventions:  Attentive ?  ?Education Outcome: ? Acknowledges education and In group clarification offered   ? ?Clinical Observations/Individualized Feedback: Pt attended and participated in group session.  ? ? ?Plan: Continue to engage patient in RT group sessions 2-3x/week. ? ? ?Victorino Sparrow, LRT,CTRS ?02/16/2022 11:56 AM ?

## 2022-02-17 LAB — BASIC METABOLIC PANEL
Anion gap: 8 (ref 5–15)
BUN: 15 mg/dL (ref 6–20)
CO2: 24 mmol/L (ref 22–32)
Calcium: 8.6 mg/dL — ABNORMAL LOW (ref 8.9–10.3)
Chloride: 102 mmol/L (ref 98–111)
Creatinine, Ser: 1.17 mg/dL (ref 0.61–1.24)
GFR, Estimated: >60 mL/min (ref >60)
Glucose, Bld: 91 mg/dL (ref 70–99)
Potassium: 4 mmol/L (ref 3.5–5.1)
Sodium: 134 mmol/L — ABNORMAL LOW (ref 135–145)

## 2022-02-17 MED ORDER — SERTRALINE HCL 50 MG PO TABS
50.0000 mg | ORAL_TABLET | Freq: Every day | ORAL | Status: DC
  Administered 2022-02-18 – 2022-02-21 (×4): 50 mg via ORAL
  Filled 2022-02-17 (×6): qty 1

## 2022-02-17 MED ORDER — QUETIAPINE FUMARATE 100 MG PO TABS
100.0000 mg | ORAL_TABLET | Freq: Every day | ORAL | Status: DC
  Administered 2022-02-17: 100 mg via ORAL
  Filled 2022-02-17 (×4): qty 1

## 2022-02-17 MED ORDER — SODIUM CHLORIDE 0.9 % IN NEBU
INHALATION_SOLUTION | RESPIRATORY_TRACT | Status: AC
  Filled 2022-02-17: qty 3

## 2022-02-17 NOTE — Group Note (Signed)
LCSW Group Therapy Note ? ?02/17/2022   10:30-11:30am  ? ?Type of Therapy and Topic:  Group Therapy: Anger Cues and Responses ? ?Participation Level:  Active ? ? ?Description of Group:   ?In this group, patients learned how to recognize the physical, cognitive, emotional, and behavioral responses they have to anger-provoking situations.  They identified a recent time they became angry and how they reacted.  They analyzed how their reaction was possibly beneficial and how it was possibly unhelpful.  The group discussed a variety of healthier coping skills that could help with such a situation in the future.  Focus was placed on how helpful it is to recognize the underlying emotions to our anger, because working on those can lead to a more permanent solution as well as our ability to focus on the important rather than the urgent. ? ?Therapeutic Goals: ?Patients will remember their last incident of anger and how they felt emotionally and physically, what their thoughts were at the time, and how they behaved. ?Patients will identify how their behavior at that time worked for them, as well as how it worked against them. ?Patients will explore possible new behaviors to use in future anger situations. ?Patients will learn that anger itself is normal and cannot be eliminated, and that healthier reactions can assist with resolving conflict rather than worsening situations. ? ?Summary of Patient Progress:  The patient shared that his most recent time of anger was when his wife died and said he doesn't know how to deal with it at this time. The group explored possible new behaviors to use in future anger situations. ? ?Therapeutic Modalities:   ?Cognitive Behavioral Therapy ? ? ?Read Drivers, LCSWA ?02/17/2022  12:50 PM   ? ?

## 2022-02-17 NOTE — BHH Counselor (Signed)
Adult Comprehensive Assessment ? ?Patient ID: Matthew Heman., male   DOB: Oct 28, 1966, 56 y.o.   MRN: [id number] ? ?Information Source: ?Information source: Patient ? ?Summary/Recommendations:   ?Summary and Recommendations (to be completed by the evaluator): Patient is a 56 year old male who was admitted to the [facility] hospital due to  complaints of Suicidal Ideation with a plan to cut himself with a razor and command auditory hallucinations. Patient lost his wife two years ago two days after being in the hospital for congestive heart failure. Patient is currently unemployed and hasn?t had a job for two years. Patient reports no current source of income. Patient reported that he sold his home in January so that it wouldn?t go into foreclosure and as a result had to live in a hotel for the past month and still waiting to receive money. Patient is concerned about his living arrangements post discharge. Patient states within the past 12 months his substances of choice are alcohol and cocaine a few times a week. Patient stated that he receives outpatient therapy with [place] but can?t remember name of therapist. Patient would benefit from group therapy, medication management, psychoeducation, crisis stabilization, peer support and discharge planning. At discharge it is recommended that the patient adhere to the established aftercare plan. The patient will be given a list of resources while at the hospital. ? ?Current Stressors:  ?Patient states their primary concerns and needs for treatment are:: "I need to be treated for my suicidal thoughts. Recently I have been hearing and seeing things" ?Patient states their goals for this hospitilization and ongoing recovery are:: "My main goal is to get better and get rid of SuicidaI thoughts" ?Educational / Learning stressors: No ?Employment / Job issues: No ?Family Relationships: No ?Financial / Lack of resources (include bankruptcy): "I had a food stamp  card and lost it. I also need housing. I sold my home to keep it from going into foreclosure and havent heard anything back in a few weeks. I was staying in a hotel before coming to hospital. " ?Housing / Lack of housing: "I was living in a hotel before here and don't know where i'm going once I leave here" ?Physical health (include injuries & life threatening diseases): "Congestive Heart Failure and High Blood Pressure" ?Social relationships: No ?Substance abuse: No ?Bereavement / Loss: "My wife, she died two years ago" ? ?Living/Environment/Situation:  ?Living Arrangements: Alone ("I was living in a hotel before coming here") ?Living conditions (as described by patient or guardian): "Good" ?Who else lives in the home?: "Nobody" ?How long has patient lived in current situation?: "About a month" ?What is atmosphere in current home: Temporary ? ?Family History:  ?Marital status: Widowed ?Widowed, when?: "For two years" ?Are you sexually active?: No ?What is your sexual orientation?: Heterosexual ?Has your sexual activity been affected by drugs, alcohol, medication, or emotional stress?: No ?Does patient have children?: Yes ?How many children?: 2 ?How is patient's relationship with their children?: "It's good" ? ?Childhood History:  ?By whom was/is the patient raised?: Mother ("My father passed away when I was [age]") ?Additional childhood history information: "No" ?Description of patient's relationship with caregiver when they were a child: "It was good" ?Patient's description of current relationship with people who raised him/her: "My mother died in 03-Mar-2001" ?How were you disciplined when you got in trouble as a child/adolescent?: "Spankings" ?Does patient have siblings?: No ?Did patient suffer any verbal/emotional/physical/sexual abuse as a child?: No ?Did patient suffer  from severe childhood neglect?: No ?Has patient ever been sexually abused/assaulted/raped as an adolescent or adult?: No ?Was the patient ever a victim  of a crime or a disaster?: No ?Witnessed domestic violence?: No ?Has patient been affected by domestic violence as an adult?: No ? ?Education:  ?Highest grade of school patient has completed: "Two years of college" ?Currently a student?: No ?Learning disability?: No ? ?Employment/Work Situation:   ?Employment Situation: Unemployed ?Patient's Job has Been Impacted by Current Illness: No ?What is the Longest Time Patient has Held a Job?: "6 years" ?Where was the Patient Employed at that Time?: "[employer]" ?Has Patient ever Been in the Military?: No ? ?Financial Resources:   ?Museum/gallery curator resources: Food stamps ?Does patient have a representative payee or guardian?: No ? ?Alcohol/Substance Abuse:   ?What has been your use of drugs/alcohol within the last 12 months?: "Alcohol, cocain I use a couple times a week" ?If attempted suicide, did drugs/alcohol play a role in this?: No ?Alcohol/Substance Abuse Treatment Hx: Denies past history ?If yes, describe treatment: n/a ?Has alcohol/substance abuse ever caused legal problems?: No ? ?Social Support System:   ?Heritage manager System: None ?Describe Community Support System: None ?Type of faith/religion: "I believe in God" ?How does patient's faith help to cope with current illness?: "I pray everyday" ? ?Leisure/Recreation:   ?Do You Have Hobbies?: No ? ?Strengths/Needs:   ?What is the patient's perception of their strengths?: "Right now it's hard to say" ?Patient states they can use these personal strengths during their treatment to contribute to their recovery: "I don't know" ?Patient states these barriers may affect/interfere with their treatment: "No" ?Patient states these barriers may affect their return to the community: "No" ?Other important information patient would like considered in planning for their treatment: "No" ? ?Discharge Plan:   ?Currently receiving community mental health services: Yes (From Whom) ("I receive outpatient therapy from [facility] but I don't know how to get in touch with my therapist") ?Patient states concerns and preferences for aftercare planning are: "Yes, I don't have no where to live and I'm really worried" ?Patient states they will know when they are safe and ready for discharge when: "I guess when I stop having suicidal thoughts. I know right now I'm not safe at all." ?Does patient have access to transportation?: No ?Does patient have financial barriers related to discharge medications?: Yes ("I have no money to pay for it") ?Patient description of barriers related to discharge medications: "I have no money to pay for it" ?Plan for no access to transportation at discharge: "To use the [employer] systems transportation". ?Plan for living situation after discharge: "I don't know, I'm worried I'll be homeless" ?Will patient be returning to same living situation after discharge?: No ("No, I wont be returning to the hotel, I don't know where I am going or what to do") ? ? ?Read Drivers MSW, LCSW-A. 02/17/2022 ?

## 2022-02-17 NOTE — Progress Notes (Signed)
?   02/17/22 0500  ?Sleep  ?Number of Hours 7.5  ? ? ?

## 2022-02-17 NOTE — Progress Notes (Addendum)
[facility] MD Progress Note  02/17/2022 1:30 PM Matthew [name].  MRN:  [id number]  Subjective:  Matthew [name] reports: "I am still hearing the voices, and they are telling me to go ahead and kill myself. I am seeing people too."  Today's assessment (02/17/2022): Pt's chart reviewed, case discussed with the treatment team. Pt with flat affect and depressed mood, attention to personal hygiene and grooming is fair, eye contact is good, speech is clear & coherent. Thought contents are organized and but with some illogical content. Pt continues to reports +AH states that the voices are commanding in nature and telling him to kill himself. He also reports visual hallucinations of people standing behind him. He states that the last time he saw someone standing behind him was earlier this morning and states that the last time he heard voices was also this morning. Pt reports suicidal ideations, and denies having a plan and is able to verbally contract for safety on the unit.   Pt reports that his sleep quality last night was poor, and that his appetite is good. Will increase pt's Seroquel to 100 mg starting tonight for management of psychosis and insomnia. Will increase Zoloft to 50 mg daily starting tomorrow (3/5).  Labs reviewed: CR is 1.37 on 3/1, repeated and WNL at 1.17. CBC is WNL, Prolactin, lipid panel, TSH completed and WNL. Hemoglobin A1C last drawn on 12/05/21 and was 5.8 as per chart review. EKG from 3/1 with QTC-466 & T wave abnormality. Repeat EKG ordered and pending. Baseline UA completed 3/1.  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   Principal Problem: MDD (major  depressive disorder), recurrent, severe, with psychosis ([place]) Diagnosis: Principal Problem:   MDD (major depressive disorder), recurrent, severe, with psychosis ([place]) Active Problems:   MDD (major depressive disorder), recurrent episode, severe ([place])  Total Time spent with patient: 20 minutes  Past Psychiatric History: Anxiety & MDD  Past Medical History:  Past Medical History:  Diagnosis Date   Acute combined systolic and diastolic CHF, NYHA class 3 ([place])    a. 02/2014 Echo: EF 25-30%.   Cardiomyopathy ([place])    a. 02/2014 Echo: EF 25-30%, sev glob HK with inferolat HK->AK, mod conc LVH, Gr 2 DD, Mild MR, sev dil LA.   Depression    DM (diabetes mellitus) ([place])    High cholesterol    Hypertension    Morbid obesity ([place])    Tobacco abuse     Past Surgical History:  Procedure Laterality Date   CATARACT EXTRACTION Right    LEFT HEART CATHETERIZATION WITH CORONARY ANGIOGRAM N/A 03/04/2014   Procedure: LEFT HEART CATHETERIZATION WITH CORONARY ANGIOGRAM;  Surgeon: Burnell Blanks, MD;  Location: [facility] CATH LAB;  Service: Cardiovascular;  Laterality: N/A;   TEE WITHOUT CARDIOVERSION N/A 02/04/2017   Procedure: TRANSESOPHAGEAL ECHOCARDIOGRAM (TEE);  Surgeon: Dorothy Spark, MD;  Location: [facility] ENDOSCOPY;  Service: Cardiovascular;  Laterality: N/A;   Family History:  Family History  Problem Relation Age of Onset   Heart attack Father        died @ 63   Cervical cancer Mother        died @  66   Family Psychiatric  History: none reported Social History:  Social History   Substance and Sexual Activity  Alcohol Use No     Social History   Substance and Sexual Activity  Drug Use No    Social History   Socioeconomic History   Marital status: Widowed    Spouse name: Not on file   Number of children: 2   Years of education: 14   Highest education level: Not on file  Occupational History   Occupation: [employer]  Tobacco Use   Smoking status: Former    Years: 20.00     Types: Cigarettes   Smokeless tobacco: Former    Quit date: 11/14/2020  Vaping Use   Vaping Use: Never used  Substance and Sexual Activity   Alcohol use: No   Drug use: No   Sexual activity: Yes  Other Topics Concern   Not on file  Social History Narrative   Lives with 2 young children in [place].  Wife died in 2020/05/20.  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   Left-handed   Caffeine: occasional tea   Social Determinants of Radio broadcast assistant Strain: Not on file  Food Insecurity: Not on file  Transportation Needs: Not on file  Physical Activity: Not on file  Stress: Not on file  Social Connections: Not on file   Additional Social History:     Sleep: Poor  Appetite:  Good  Current Medications: Current Facility-Administered Medications  Medication Dose Route Frequency Provider Last Rate Last Admin   acetaminophen (TYLENOL) tablet 650 mg  650 mg Oral Q6H PRN Lucky Rathke, FNP   650 mg at 02/16/22 0812   alum & mag hydroxide-simeth (MAALOX/MYLANTA) 200-200-20 MG/5ML suspension 30 mL  30 mL Oral Q4H PRN Lucky Rathke, FNP       aspirin EC tablet 81 mg  81 mg Oral Daily Lucky Rathke, FNP   81 mg at 02/17/22 0800   atorvastatin (LIPITOR) tablet 80 mg  80 mg Oral Daily Lucky Rathke, FNP   80 mg at 02/17/22 0800   ezetimibe (ZETIA) tablet 10 mg  10 mg Oral Daily Lucky Rathke, FNP   10 mg at 02/17/22 0800   furosemide (LASIX) tablet 40 mg  40 mg Oral Daily Lucky Rathke, FNP   40 mg at 02/17/22 0800   hydrOXYzine (ATARAX) tablet 25 mg  25 mg Oral TID PRN Lucky Rathke, FNP   25 mg at 02/16/22 2130   isosorbide-hydrALAZINE (BIDIL) 20-37.5 MG per tablet 1 tablet  1 tablet Oral TID Minda Ditto, RPH   1 tablet at 02/17/22 0800   magnesium hydroxide (MILK OF MAGNESIA) suspension 30 mL  30 mL Oral Daily PRN Lucky Rathke, FNP       melatonin tablet 5 mg  5 mg Oral QHS Nicholes Rough, NP   5 mg at 02/16/22 2130   QUEtiapine (SEROQUEL) tablet 100 mg   100 mg Oral QHS Nicholes Rough, NP       sacubitril-valsartan (ENTRESTO) 49-51 mg per tablet  1 tablet Oral BID Lucky Rathke, FNP       Derrill Memo ON 02/18/2022] sertraline (ZOLOFT) tablet 50 mg  50 mg Oral Daily Nkwenti, Doris, NP       sodium chloride 0.9 % nebulizer solution            spironolactone (ALDACTONE) tablet 12.5 mg  12.5 mg Oral  Daily Lucky Rathke, FNP   12.5 mg at 02/17/22 0800   timolol (TIMOPTIC) 0.5 % ophthalmic solution 1 drop  1 drop Both Eyes QPC breakfast Lucky Rathke, FNP   1 drop at 02/17/22 0800    Lab Results:  Results for orders placed or performed during the hospital encounter of 02/15/22 (from the past 48 hour(s))  Basic metabolic panel     Status: Abnormal   Collection Time: 02/17/22  6:34 AM  Result Value Ref Range   Sodium 134 (L) 135 - 145 mmol/L   Potassium 4.0 3.5 - 5.1 mmol/L   Chloride 102 98 - 111 mmol/L   CO2 24 22 - 32 mmol/L   Glucose, Bld 91 70 - 99 mg/dL    Comment: Glucose reference range applies only to samples taken after fasting for at least 8 hours.   BUN 15 6 - 20 mg/dL   Creatinine, Ser 1.17 0.61 - 1.24 mg/dL   Calcium 8.6 (L) 8.9 - 10.3 mg/dL   GFR, Estimated >60 >60 mL/min    Comment: (NOTE) Calculated using the CKD-EPI Creatinine Equation (2021)    Anion gap 8 5 - 15    Comment: Performed at [facility], [place] [address], [place], [place] [address]    Blood Alcohol level:  Lab Results  Component Value Date   ETH <10 43/15/4008    Metabolic Disorder Labs: Lab Results  Component Value Date   HGBA1C 5.8 12/05/2021   MPG 111.15 12/27/2020   MPG 142.72 07/04/2020   Lab Results  Component Value Date   PROLACTIN 4.8 02/14/2022   Lab Results  Component Value Date   CHOL 166 02/14/2022   TRIG 57 02/14/2022   HDL 54 02/14/2022   CHOLHDL 3.1 02/14/2022   VLDL 11 02/14/2022   LDLCALC 101 (H) 02/14/2022   LDLCALC 40 09/15/2021    Physical Findings: AIMS: Facial and Oral Movements Muscles of Facial  Expression: None, normal Lips and Perioral Area: None, normal Jaw: None, normal Tongue: None, normal,Extremity Movements Upper (arms, wrists, hands, fingers): None, normal Lower (legs, knees, ankles, toes): None, normal, Trunk Movements Neck, shoulders, hips: None, normal, Overall Severity Severity of abnormal movements (highest score from questions above): None, normal Incapacitation due to abnormal movements: None, normal Patient's awareness of abnormal movements (rate only patient's report): No Awareness, Dental Status Current problems with teeth and/or dentures?: No Does patient usually wear dentures?: No  CIWA:  n/a COWS:  n/a AIMS: 0  Musculoskeletal: Strength & Muscle Tone: decreased Gait & Station: unsteady Patient leans: N/A  Psychiatric Specialty Exam:  Presentation  General Appearance: Disheveled  Eye Contact:Fair  Speech:Clear and Coherent  Speech Volume:Normal  Handedness:Right   Mood and Affect  Mood:Anxious; Depressed  Affect:Congruent   Thought Process  Thought Processes:Coherent  Descriptions of Associations:Intact  Orientation:Full (Time, Place and Person)  Thought Content:Logical  History of Schizophrenia/Schizoaffective disorder:No  Duration of Psychotic Symptoms:No data recorded Hallucinations:Hallucinations: Auditory; Visual Description of Auditory Hallucinations: commanding in nature telling him to kill self  Ideas of Reference:None  Suicidal Thoughts:Suicidal Thoughts: Yes, Active SI Active Intent and/or Plan: Without Intent; Without Plan SI Passive Intent and/or Plan: Without Intent; Without Plan  Homicidal Thoughts:Homicidal Thoughts: No  Sensorium  Memory:Immediate Good; Recent Good  Judgment:Fair  Insight:Fair  Executive Functions  Concentration:Fair  Attention Span:Fair  [place]  Psychomotor Activity  Psychomotor Activity:Psychomotor Activity: Normal  Assets   Assets:Communication Skills  Sleep  Sleep:Sleep: Poor  Physical  Exam: Physical Exam Constitutional:      Appearance: He is ill-appearing.  HENT:     Head: Normocephalic.     Nose: Nose normal. No congestion or rhinorrhea.  Eyes:     Pupils: Pupils are equal, round, and reactive to light.  Pulmonary:     Effort: Pulmonary effort is normal.  Neurological:     Mental Status: He is alert and oriented to person, place, and time.     Sensory: No sensory deficit.  Psychiatric:        Behavior: Behavior normal.   Review of Systems  Constitutional: Negative.  Negative for fever.  HENT: Negative.  Negative for sore throat.   Eyes: Negative.   Respiratory: Negative.  Negative for cough, shortness of breath and wheezing.   Cardiovascular: Negative.  Negative for chest pain.  Gastrointestinal: Negative.  Negative for heartburn and nausea.  Genitourinary: Negative.   Musculoskeletal: Negative.   Skin: Negative.   Neurological: Negative.  Negative for dizziness and headaches.  Endo/Heme/Allergies: Negative.   Psychiatric/Behavioral:  Positive for depression, hallucinations, substance abuse and suicidal ideas. The patient is nervous/anxious and has insomnia.   Blood pressure (!) 164/111, pulse 67, temperature 97.6 F (36.4 C), temperature source Oral, resp. rate 16, height '5\' 9"'$  (1.753 m), weight 85.7 kg, SpO2 100 %. Body mass index is 27.91 kg/m.  PLAN Safety and Monitoring: Voluntary admission to inpatient psychiatric unit for safety, stabilization and treatment Daily contact with patient to assess and evaluate symptoms and progress in treatment Patient's case to be discussed in multi-disciplinary team meeting Observation Level : q15 minute checks Vital signs: q12 hours Precautions: suicide, elopement, and assault   Long Term Goal(s): Improvement in symptoms so as ready for discharge   Short Term Goals: Ability to identify changes in lifestyle to reduce recurrence of condition  will improve, Ability to verbalize feelings will improve, Ability to disclose and discuss suicidal ideas, Ability to demonstrate self-control will improve, Ability to identify and develop effective coping behaviors will improve, Compliance with prescribed medications will improve, and Ability to identify triggers associated with substance abuse/mental health issues will improve   Physician Treatment Plan for Secondary Diagnosis:  Principal Problem:   MDD (major depressive disorder), recurrent, severe, with psychosis ([place]) Active Problems:   MDD (major depressive disorder), recurrent episode, severe ([facility])   Medications MDD recurrent severe with Psychotic Features -Discontinued Prozac on 3/3 -Increase Zoloft to 50 mg daily for depression (start 3/5 @ 0800) -Increase Seroquel to 100 mg nightly for psychosis    Anxiety -Continue Hydroxyzine 25 mg every 6 hours PRN   Medical Medications Continue Lasix 40 mg daily Continue Bidil 20-37.5 mg daily Continue Entresto 49-51 mg daily Continue Aldactone 12.5 mg daily  Continue Timolol eye drops daily Continue Lipitor 80 mg daily Continue Zetia 10 mg daily   Hypokalemia on Lasix - resolved - K+ today 4.0  Hyponatremia (Na+ 134 on 02/17/22) - Monitor with titration up on SSRI    Other PRNS -Continue Tylenol 650 mg every 6 hours PRN for mild pain -Continue Maalox 30 mg every 4 hrs PRN for indigestion -Continue Milk of Magnesia as needed every 6 hrs for constipation -Discontinued Trazodone '50mg'$  nightly (h/o CHF) -Continue Melatonin 5 mg nightly for insomnia   Discharge Planning: Social work and case management to assist with discharge planning and identification of hospital follow-up needs prior to discharge Estimated LOS: 5-7 days Discharge Concerns: Need to establish a safety plan; Medication compliance and effectiveness Discharge Goals: Return home  with outpatient referrals for mental health follow-up including medication  management/psychotherapy   Nicholes Rough, NP 02/17/2022, 1:30 PM

## 2022-02-17 NOTE — BHH Group Notes (Signed)
Goals Group ?3/4//2023 ? ? ?Group Focus: affirmation, clarity of thought, and goals/reality orientation ?Treatment Modality:  Psychoeducation ?Interventions utilized were assignment, group exercise, and support ?Purpose: To be able to understand and verbalize the reason for their admission to the hospital. To understand that the medication helps with their chemical imbalance but they also need to work on their choices in life. To be challenged to develop a list of 30 positives about themselves. Also introduce the concept that "feelings" are not reality. ? ?Participation Level:  Active ? ?Participation Quality:  Appropriate ? ?Affect:  Appropriate ? ?Cognitive:  Appropriate ? ?Insight:  Improving ? ?Engagement in Group:  Engaged ? ?Additional Comments:  Pt . Rates himself at a 2/10. Participated in the group.. ? ?Matthew [name] A ?

## 2022-02-17 NOTE — BHH Group Notes (Signed)
Adult Psychoeducational Group Note ? ?Date:  02/17/2022 ?Time:  10:01 PM ? ?Group Topic/Focus:  ?Wrap-Up Group:   The focus of this group is to help patients review their daily goal of treatment and discuss progress on daily workbooks. ? ?Participation Level:  Active ? ?Participation Quality:  Appropriate ? ?Affect:  Appropriate ? ?Cognitive:  Appropriate ? ?Insight: Appropriate ? ?Engagement in Group:  Engaged ? ?Modes of Intervention:  Discussion ? ?Additional Comments:  Pt's goal was to work on coping skills for depression.  Pt stated he did achieve his goal.  However, the pt rated the day at a 2/10. ? ?Kailah Pennel ?02/17/2022, 10:01 PM ?

## 2022-02-17 NOTE — Progress Notes (Signed)
Pt A&OX4, hesistantly cooperative, c/o suicidal ideations without a plan or intent. Pt denies homicidal ideations. Pt c/o experiencing auditory and visual hallucinations. "I hear voices telling me to kill myself, and I saw somebody standing beside me in my room this morning." Pt states he slept "up and down" last night. Pt denies nightmares. Pt attended groups. ?

## 2022-02-17 NOTE — BHH Group Notes (Signed)
Psychoeducational Group Note ? ? ? ?Date:02/17/22 ?Time: 1300-1400 ? ? ? ?Purpose of Group: . The group focus' on teaching patients on how to identify their needs and their Life Skills:  A group where two lists are made. What people need and what are things that we do that are unhealthy. The lists are developed by the patients and it is explained that we often do the actions that are not healthy to get our list of needs met. ? ?Goal:: to develop the coping skills needed to get their needs met ? ?Participation Level:  Active ? ?Participation Quality:  Appropriate ? ?Affect:  Appropriate ? ?Cognitive:  Oriented ? ?Insight:  Improving ? ?Engagement in Group:  Engaged ? ?Additional Comments: rates energy at a 2. Did participate in the gathering of the needs list and the unhealthy list. ? ?[name] ? ?

## 2022-02-17 NOTE — Progress Notes (Signed)
?   02/17/22 2236  ?Psych Admission Type (Psych Patients Only)  ?Admission Status Voluntary  ?Psychosocial Assessment  ?Patient Complaints None  ?Eye Contact Fair  ?Facial Expression Flat  ?Affect Appropriate to circumstance  ?Speech Logical/coherent  ?Interaction Assertive  ?Motor Activity Other (Comment) ?(WDL)  ?Appearance/Hygiene Unremarkable  ?Behavior Characteristics Appropriate to situation  ?Mood Pleasant  ?Thought Process  ?Coherency WDL  ?Content WDL  ?Delusions None reported or observed  ?Perception WDL  ?Hallucination Auditory  ?Judgment Impaired  ?Confusion None  ?Danger to Self  ?Current suicidal ideation? Denies  ?Self-Injurious Behavior No self-injurious ideation or behavior indicators observed or expressed   ?Agreement Not to Harm Self Yes  ?Description of Agreement verbak  ?Danger to Others  ?Danger to Others None reported or observed  ? ? ?

## 2022-02-17 NOTE — [facility] Group Notes (Signed)
Pt did not attend in orientation/goals group.  ?

## 2022-02-18 LAB — GLUCOSE, CAPILLARY: Glucose-Capillary: 124 mg/dL — ABNORMAL HIGH (ref 70–99)

## 2022-02-18 MED ORDER — IBUPROFEN 400 MG PO TABS
ORAL_TABLET | ORAL | Status: AC
  Filled 2022-02-18: qty 1

## 2022-02-18 MED ORDER — IBUPROFEN 400 MG PO TABS
400.0000 mg | ORAL_TABLET | Freq: Four times a day (QID) | ORAL | Status: DC | PRN
  Administered 2022-02-18 – 2022-02-25 (×5): 400 mg via ORAL
  Filled 2022-02-18 (×4): qty 1

## 2022-02-18 MED ORDER — QUETIAPINE FUMARATE 200 MG PO TABS
200.0000 mg | ORAL_TABLET | Freq: Every day | ORAL | Status: DC
  Administered 2022-02-18 – 2022-02-20 (×3): 200 mg via ORAL
  Filled 2022-02-18 (×7): qty 1

## 2022-02-18 NOTE — Progress Notes (Signed)
Pt denies HI but endorses SI and AVH and verbally agrees to approach staff if these become apparent or before harming themselves/others. Rates depression 8/10. Rates anxiety 8/10. Rates pain 9/10.  Pt still isolates to room but will come out for meals. Pt BP is high but after meds, decreases. Pt still complains of headaches and slight dizziness. NP notified. Precautions taken and reminded pt. Scheduled medications administered to pt, per MD orders. RN provided support and encouragement to pt. Q15 min safety checks implemented and continued. Pt safe on the unit. RN will continue to monitor and intervene as needed.  ? 02/18/22 0817  ?Psych Admission Type (Psych Patients Only)  ?Admission Status Voluntary  ?Psychosocial Assessment  ?Patient Complaints Anxiety;Depression;Self-harm thoughts  ?Eye Contact Fair  ?Facial Expression Anxious;Flat  ?Affect Anxious;Flat  ?Speech Logical/coherent  ?Interaction Assertive  ?Motor Activity Slow  ?Appearance/Hygiene Unremarkable  ?Behavior Characteristics Appropriate to situation;Cooperative  ?Mood Pleasant;Empty  ?Thought Process  ?Coherency WDL  ?Content WDL  ?Delusions None reported or observed  ?Perception Hallucinations  ?Hallucination Auditory;Visual  ?Judgment Impaired  ?Confusion None  ?Danger to Self  ?Current suicidal ideation? Passive  ?Self-Injurious Behavior No self-injurious ideation or behavior indicators observed or expressed   ?Agreement Not to Harm Self Yes  ?Description of Agreement verbal  ?Danger to Others  ?Danger to Others None reported or observed  ? ? ?

## 2022-02-18 NOTE — Progress Notes (Signed)
?   02/18/22 2318  ?Psych Admission Type (Psych Patients Only)  ?Admission Status Voluntary  ?Psychosocial Assessment  ?Patient Complaints Anxiety  ?Eye Contact Fair  ?Facial Expression Flat  ?Affect Appropriate to circumstance  ?Speech Logical/coherent  ?Interaction Minimal  ?Motor Activity Slow  ?Appearance/Hygiene Unremarkable  ?Behavior Characteristics Appropriate to situation  ?Mood Pleasant  ?Thought Process  ?Coherency WDL  ?Content WDL  ?Delusions None reported or observed  ?Perception Hallucinations  ?Hallucination Auditory  ?Judgment Impaired  ?Confusion None  ?Danger to Self  ?Current suicidal ideation? Denies  ?Self-Injurious Behavior No self-injurious ideation or behavior indicators observed or expressed   ?Agreement Not to Harm Self Yes  ?Description of Agreement verbal  ?Danger to Others  ?Danger to Others None reported or observed  ? ? ?

## 2022-02-18 NOTE — Progress Notes (Addendum)
[facility] MD Progress Note  02/18/2022 2:01 PM Matthew Heman.  MRN:  [id number]  Subjective:  Matthew [name] reports: "I am doing a little better, but I am still hearing the voices and seeing shadows too."  Today's assessment (02/18/2022): Pt's chart reviewed, case discussed with the treatment team. Pt with flat affect and depressed mood, attention to personal hygiene and grooming is fair, eye contact is good, speech is clear & coherent. Thought contents are organized and logical. Pt continues to reports +AH states that the voices are still commanding in nature and telling him to kill himself. He has no suicide plan or intent and is verbally contracting for safety on the unit. He also reports visual hallucinations of shadows, and states that the last time he had AVH was early this morning.   Pt reports that his sleep quality last night was poor because he does not have a CPAP machine here at the hospital. He is unsure what the settings of his machine are, and unable to state where he completed his sleep study. Pt's assigned RN will be informed to pass onto the night shift staff to keep the head of pt's bed elevated at a 20-30 degree angle while he sleeps. Pt's Seroquel was increased to 100 mg starting last night for management of psychosis and insomnia.  Zoloft was also increased to 50 mg daily starting today (3/5). Melatonin 5 mg was added for insomnia. Trazodone was discontinued due CHF. Will further increase Seroquel to 200 mg nightly for management of psychosis.  Labs reviewed: CR is 1.37 on 3/1, repeated and WNL at 1.17. CBC is WNL, Prolactin, lipid panel, TSH completed and WNL. Hemoglobin A1C last drawn on 12/05/21 and was 5.8 as per chart review. EKG from 3/1 with QTC-466 & T wave abnormality. Repeat EKG ordered and pending. Baseline UA completed 3/1, color was straw, but results otherwise WNL.  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   Principal Problem: MDD (major depressive disorder), recurrent, severe, with psychosis ([place]) Diagnosis: Principal Problem:   MDD (major depressive disorder), recurrent, severe, with psychosis ([place]) Active Problems:   MDD (major depressive disorder), recurrent episode, severe ([place])  Total Time spent with patient: 20 minutes  Past Psychiatric History: Anxiety & MDD  Past Medical History:  Past Medical History:  Diagnosis Date   Acute combined systolic and diastolic CHF, NYHA class 3 ([place])    a. 02/2014 Echo: EF 25-30%.   Cardiomyopathy ([place])    a. 02/2014 Echo: EF 25-30%, sev glob HK with inferolat HK->AK, mod conc LVH, Gr 2 DD, Mild MR, sev dil LA.   Depression    DM (diabetes mellitus) ([place])    High cholesterol    Hypertension    Morbid obesity ([place])    Tobacco abuse     Past Surgical History:  Procedure Laterality Date   CATARACT EXTRACTION Right    LEFT HEART CATHETERIZATION WITH CORONARY ANGIOGRAM N/A 03/04/2014   Procedure: LEFT HEART CATHETERIZATION WITH CORONARY ANGIOGRAM;  Surgeon: Burnell Blanks, MD;  Location: [facility] CATH LAB;  Service: Cardiovascular;  Laterality: N/A;   TEE WITHOUT CARDIOVERSION N/A 02/04/2017   Procedure: TRANSESOPHAGEAL ECHOCARDIOGRAM (  TEE);  Surgeon: Dorothy Spark, MD;  Location: [facility] ENDOSCOPY;  Service: Cardiovascular;  Laterality: N/A;   Family History:  Family History  Problem Relation Age of Onset   Heart attack Father        died @ 83   Cervical cancer Mother        died @ 2   Family Psychiatric  History: none reported Social History:  Social History   Substance and Sexual Activity  Alcohol Use No     Social History   Substance and Sexual Activity  Drug  Use No    Social History   Socioeconomic History   Marital status: Widowed    Spouse name: Not on file   Number of children: 2   Years of education: 14   Highest education level: Not on file  Occupational History   Occupation: [employer]  Tobacco Use   Smoking status: Former    Years: 20.00    Types: Cigarettes   Smokeless tobacco: Former    Quit date: 11/14/2020  Vaping Use   Vaping Use: Never used  Substance and Sexual Activity   Alcohol use: No   Drug use: No   Sexual activity: Yes  Other Topics Concern   Not on file  Social History Narrative   Lives with 2 young children in [place].  Wife died in May 01, 2020.  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   Left-handed   Caffeine: occasional tea   Social Determinants of Radio broadcast assistant Strain: Not on file  Food Insecurity: Not on file  Transportation Needs: Not on file  Physical Activity: Not on file  Stress: Not on file  Social Connections: Not on file   Additional Social History:     Sleep: Poor  Appetite:  Good  Current Medications: Current Facility-Administered Medications  Medication Dose Route Frequency Provider Last Rate Last Admin   acetaminophen (TYLENOL) tablet 650 mg  650 mg Oral Q6H PRN Lucky Rathke, FNP   650 mg at 02/18/22 0817   alum & mag hydroxide-simeth (MAALOX/MYLANTA) 200-200-20 MG/5ML suspension 30 mL  30 mL Oral Q4H PRN Lucky Rathke, FNP       aspirin EC tablet 81 mg  81 mg Oral Daily Lucky Rathke, FNP   81 mg at 02/18/22 0817   atorvastatin (LIPITOR) tablet 80 mg  80 mg Oral Daily Lucky Rathke, FNP   80 mg at 02/18/22 0817   ezetimibe (ZETIA) tablet 10 mg  10 mg Oral Daily Lucky Rathke, FNP   10 mg at 02/18/22 0817   furosemide (LASIX) tablet 40 mg  40 mg Oral Daily Lucky Rathke, FNP   40 mg at 02/18/22 0817   hydrOXYzine (ATARAX) tablet 25 mg  25 mg Oral TID PRN Lucky Rathke, FNP   25 mg at 02/17/22 2145   ibuprofen (ADVIL) tablet 400 mg  400 mg Oral  Q6H PRN Nicholes Rough, NP   400 mg at 02/18/22 1327   isosorbide-hydrALAZINE (BIDIL) 20-37.5 MG per tablet 1 tablet  1 tablet Oral TID Minda Ditto, RPH   1 tablet at 02/18/22 1210   magnesium hydroxide (MILK OF MAGNESIA) suspension 30 mL  30 mL Oral Daily PRN Lucky Rathke, FNP       melatonin tablet 5 mg  5 mg Oral QHS Maki Hege, NP   5 mg at 02/17/22 2145   QUEtiapine (SEROQUEL) tablet 200 mg  200 mg Oral QHS Nicholes Rough, NP       sacubitril-valsartan (ENTRESTO) 49-51 mg per tablet  1 tablet Oral BID Lucky Rathke, FNP   1 tablet at 02/17/22 1713   sertraline (ZOLOFT) tablet 50 mg  50 mg Oral Daily Nicholes Rough, NP   50 mg at 02/18/22 2683   spironolactone (ALDACTONE) tablet 12.5 mg  12.5 mg Oral Daily Lucky Rathke, FNP   12.5 mg at 02/18/22 0818   timolol (TIMOPTIC) 0.5 % ophthalmic solution 1 drop  1 drop Both Eyes QPC breakfast Lucky Rathke, FNP   1 drop at 02/18/22 0818    Lab Results:  Results for orders placed or performed during the hospital encounter of 02/15/22 (from the past 48 hour(s))  Basic metabolic panel     Status: Abnormal   Collection Time: 02/17/22  6:34 AM  Result Value Ref Range   Sodium 134 (L) 135 - 145 mmol/L   Potassium 4.0 3.5 - 5.1 mmol/L   Chloride 102 98 - 111 mmol/L   CO2 24 22 - 32 mmol/L   Glucose, Bld 91 70 - 99 mg/dL    Comment: Glucose reference range applies only to samples taken after fasting for at least 8 hours.   BUN 15 6 - 20 mg/dL   Creatinine, Ser 1.17 0.61 - 1.24 mg/dL   Calcium 8.6 (L) 8.9 - 10.3 mg/dL   GFR, Estimated >60 >60 mL/min    Comment: (NOTE) Calculated using the CKD-EPI Creatinine Equation (2021)    Anion gap 8 5 - 15    Comment: Performed at [facility], [place] [address] [place], [place] [address]  Glucose, capillary     Status: Abnormal   Collection Time: 02/18/22 11:26 AM  Result Value Ref Range   Glucose-Capillary 124 (H) 70 - 99 mg/dL    Comment: Glucose reference range applies only to  samples taken after fasting for at least 8 hours.    Blood Alcohol level:  Lab Results  Component Value Date   ETH <10 22/97/9892    Metabolic Disorder Labs: Lab Results  Component Value Date   HGBA1C 5.8 12/05/2021   MPG 111.15 12/27/2020   MPG 142.72 07/04/2020   Lab Results  Component Value Date   PROLACTIN 4.8 02/14/2022   Lab Results  Component Value Date   CHOL 166 02/14/2022   TRIG 57 02/14/2022   HDL 54 02/14/2022   CHOLHDL 3.1 02/14/2022   VLDL 11 02/14/2022   LDLCALC 101 (H) 02/14/2022   LDLCALC 40 09/15/2021    Physical Findings: AIMS: Facial and Oral Movements Muscles of Facial Expression: None, normal Lips and Perioral Area: None, normal Jaw: None, normal Tongue: None, normal,Extremity Movements Upper (arms, wrists, hands, fingers): None, normal Lower (legs, knees, ankles, toes): None, normal, Trunk Movements Neck, shoulders, hips: None, normal, Overall Severity Severity of abnormal movements (highest score from questions above): None, normal Incapacitation due to abnormal movements: None, normal Patient's awareness of abnormal movements (rate only patient's report): No Awareness, Dental Status Current problems with teeth and/or dentures?: No Does patient usually wear dentures?: No  CIWA:  n/a COWS:  n/a AIMS: 0  Musculoskeletal: Strength & Muscle Tone: decreased Gait & Station: unsteady Patient leans: N/A  Psychiatric Specialty Exam:  Presentation  General Appearance: Appropriate for Environment  Eye Contact:Good  Speech:Clear and Coherent  Speech Volume:Normal  Handedness:Right   Mood and Affect  Mood:Depressed  Affect:Congruent   Thought Process  Thought Processes:Coherent  Descriptions of  Associations:Intact  Orientation:Full (Time, Place and Person)  Thought Content:Logical  History of Schizophrenia/Schizoaffective disorder:No  Duration of Psychotic Symptoms:No data recorded Hallucinations:Hallucinations:  Auditory; Command Description of Command Hallucinations: voices telling him to kill himself Description of Visual Hallucinations: shadows  Ideas of Reference:Paranoia  Suicidal Thoughts:Suicidal Thoughts: Yes, Active SI Active Intent and/or Plan: Without Intent; Without Plan SI Passive Intent and/or Plan: Without Intent; Without Plan  Homicidal Thoughts:Homicidal Thoughts: No  Sensorium  Memory:Immediate Good; Recent Good  Judgment:Poor  Insight:Poor  Executive Functions  Concentration:Fair  Attention Span:Fair  [place]  Psychomotor Activity  Psychomotor Activity:Psychomotor Activity: Decreased  Assets  Assets:Communication Skills  Sleep  Sleep:Sleep: Poor  Physical Exam: Physical Exam Constitutional:      Appearance: He is ill-appearing.  HENT:     Head: Normocephalic.     Nose: Nose normal. No congestion or rhinorrhea.  Eyes:     Pupils: Pupils are equal, round, and reactive to light.  Pulmonary:     Effort: Pulmonary effort is normal.  Neurological:     Mental Status: He is alert and oriented to person, place, and time.     Sensory: No sensory deficit.  Psychiatric:        Behavior: Behavior normal.   Review of Systems  Constitutional: Negative.  Negative for fever.  HENT: Negative.  Negative for sore throat.   Eyes: Negative.   Respiratory: Negative.  Negative for cough, shortness of breath and wheezing.   Cardiovascular: Negative.  Negative for chest pain.  Gastrointestinal: Negative.  Negative for heartburn and nausea.  Genitourinary: Negative.   Musculoskeletal: Negative.   Skin: Negative.   Neurological: Negative.  Negative for dizziness and headaches.  Endo/Heme/Allergies: Negative.   Psychiatric/Behavioral:  Positive for depression, hallucinations, substance abuse and suicidal ideas. The patient is nervous/anxious and has insomnia.   Blood pressure 130/89, pulse 74, temperature 97.6 F (36.4 C),  temperature source Oral, resp. rate 16, height '5\' 9"'$  (1.753 m), weight 85.7 kg, SpO2 100 %. Body mass index is 27.91 kg/m.  PLAN Safety and Monitoring: Voluntary admission to inpatient psychiatric unit for safety, stabilization and treatment Daily contact with patient to assess and evaluate symptoms and progress in treatment Patient's case to be discussed in multi-disciplinary team meeting Observation Level : q15 minute checks Vital signs: q12 hours Precautions: suicide, elopement, and assault   Long Term Goal(s): Improvement in symptoms so as ready for discharge   Short Term Goals: Ability to identify changes in lifestyle to reduce recurrence of condition will improve, Ability to verbalize feelings will improve, Ability to disclose and discuss suicidal ideas, Ability to demonstrate self-control will improve, Ability to identify and develop effective coping behaviors will improve, Compliance with prescribed medications will improve, and Ability to identify triggers associated with substance abuse/mental health issues will improve   Physician Treatment Plan for Secondary Diagnosis:  Principal Problem:   MDD (major depressive disorder), recurrent, severe, with psychosis ([place]) Active Problems:   MDD (major depressive disorder), recurrent episode, severe ([facility])   Medications MDD recurrent severe with Psychotic Features -Discontinued Prozac on 3/3 -Continue Zoloft to 50 mg daily for depression  -Will increase Seroquel to 200 mg nightly for psychosis    Anxiety -Continue Hydroxyzine 25 mg every 6 hours PRN   Medical Medications Continue Lasix 40 mg daily Continue Bidil 20-37.5 mg daily Continue Entresto 49-51 mg daily Continue Aldactone 12.5 mg daily  Continue Timolol eye drops daily Continue Lipitor 80 mg daily Continue Zetia 10 mg  daily   Hypokalemia on Lasix - resolved - K+ today 4.0  Hyponatremia (Na+ 134 on 02/17/22) - Monitor with titration up on SSRI    Other  PRNS -Continue Tylenol 650 mg every 6 hours PRN for mild pain -Continue Maalox 30 mg every 4 hrs PRN for indigestion -Continue Milk of Magnesia as needed every 6 hrs for constipation -Discontinued Trazodone '50mg'$  nightly (h/o CHF) -Continue Melatonin 5 mg nightly for insomnia   Discharge Planning: Social work and case management to assist with discharge planning and identification of hospital follow-up needs prior to discharge Estimated LOS: 5-7 days Discharge Concerns: Need to establish a safety plan; Medication compliance and effectiveness Discharge Goals: Return home with outpatient referrals for mental health follow-up including medication management/psychotherapy   Nicholes Rough, NP 02/18/2022, 2:01 PM Patient ID: Matthew Heman., male   DOB: [birth year], 56 y.o.   MRN: [id number]

## 2022-02-18 NOTE — Group Note (Signed)
?  [facility] LCSW Group Therapy Note ? ?Date/Time:  02/18/2022 10:00AM-11:00AM ? ?Type of Therapy and Topic:  Group Therapy:  Self-Care after Hospitalization ? ?Participation Level:  None  ? ?Description of Group ?This process group involved patients discussing how they plan to take care of themselves in a better manner when they get home from the hospital.  The group started with patients listing one healthy and one unhealthy way they took care of themselves prior to hospitalization.  A discussion ensued about the differences in healthy and unhealthy coping skills.  Group members shared ideas about making changes when they return home so that they can stay well and in recovery.  The white board was used to list ideas so that patients can continue to see these ideas throughout the day. ? ?Therapeutic Goals ?Patient will identify and describe one healthy and one unhealthy coping technique used prior to hospitalization ?Patient will participate in generating ideas about healthy self-care options when they return to the community ?Patients will be supportive of one another and receive said support from others ?Patient will identify one healthy self-care activity to add to his/her post-hospitalization life that can help in recovery ? ?Summary of Patient Progress:  The patient did not express any self-care activity during group. Patient was able to listen to group members participate in generating ideas about healthy self-care options when they return to the community.  ? ?Therapeutic Modalities ?Brief Solution-Focused Therapy ?Motivational Interviewing ?Psychoeducation ? ? ?   ?Read Drivers, LCSWA ?02/18/2022  12:55 PM   ? ?

## 2022-02-18 NOTE — Progress Notes (Signed)
Patient ID: Matthew [name]., male   DOB: [birth year], 56 y.o.   MRN: [id number] ? ?CSW SPE NOTE: ? ?CSW contacted mother-in-law, Sheppard Coil [phone], to complete SPE. No response, CSW not able to leave HIPPA compliant voicemail.  ? ?1st attempt ?02/18/2022 @ 4:00pm ? ?Read Drivers, MSW, LCSW-A  ? ?

## 2022-02-18 NOTE — [facility] Suicide Risk Assessment (Signed)
[facility] INPATIENT:  Family/Significant Other Suicide Prevention Education ? ?Suicide Prevention Education:  ? ?Contact Attempts: Sheppard Coil, mother-in-law, [phone], has been identified by the patient as the family member/significant other with whom the patient will be residing, and identified as the person(s) who will aid the patient in the event of a mental health crisis. With written consent from the patient, one attempt was made to provide suicide prevention education, prior to and/or following the patient's discharge.  We were unsuccessful in providing suicide prevention education. A suicide education pamphlet will be given to the patient to share with family/significant other if no contact is made by weekday CSW. ? ?Date and time of first attempt:  02/18/2022  @ 4:00PM ?2ND attempt will be made by weekday CSW ? ?Read Drivers, MSW, LCSW-A ?02/18/2022, 5:02PM ?

## 2022-02-18 NOTE — [facility] Group Notes (Signed)
Pt did not attend group. 

## 2022-02-18 NOTE — [facility] Group Notes (Signed)
Adult Psychoeducational Group Not ?Date:  02/18/2022 ?Time:  9935-7017 ?Group Topic/Focus: PROGRESSIVE RELAXATION. A group where deep breathing is taught and tensing and relaxation muscle groups is used. Imagery is used as well.  Pts are asked to imagine 3 pillars that hold them up when they are not able to hold themselves up and to share that with the group. ? ?Participation Level:  did not attend ? ?Matthew [name] A ? ?

## 2022-02-19 LAB — BASIC METABOLIC PANEL
Anion gap: 5 (ref 5–15)
BUN: 13 mg/dL (ref 6–20)
CO2: 27 mmol/L (ref 22–32)
Calcium: 8.8 mg/dL — ABNORMAL LOW (ref 8.9–10.3)
Chloride: 105 mmol/L (ref 98–111)
Creatinine, Ser: 1.2 mg/dL (ref 0.61–1.24)
GFR, Estimated: >60 mL/min (ref >60)
Glucose, Bld: 95 mg/dL (ref 70–99)
Potassium: 4.2 mmol/L (ref 3.5–5.1)
Sodium: 137 mmol/L (ref 135–145)

## 2022-02-19 NOTE — Progress Notes (Signed)
?   02/19/22 2000  ?Psych Admission Type (Psych Patients Only)  ?Admission Status Voluntary  ?Psychosocial Assessment  ?Patient Complaints Self-harm thoughts;Anxiety;Depression  ?Eye Contact Brief  ?Facial Expression Anxious  ?Affect Anxious;Apprehensive;Depressed  ?Speech Slow  ?Interaction Guarded  ?Motor Activity Slow  ?Appearance/Hygiene Unremarkable  ?Behavior Characteristics Cooperative;Anxious  ?Mood Anxious;Depressed  ?Thought Process  ?Coherency Blocking  ?Content WDL  ?Delusions None reported or observed  ?Perception Hallucinations  ?Hallucination Auditory;Command;Visual  ?Judgment Impaired  ?Confusion None  ?Danger to Self  ?Current suicidal ideation? Passive  ?Self-Injurious Behavior Some self-injurious ideation observed or expressed.  No lethal plan expressed   ?Agreement Not to Harm Self Yes  ?Description of Agreement verbal  ?Danger to Others  ?Danger to Others None reported or observed  ? ?Pt seen in his room. Pt states he has a headache. "I've been trying to get some Tylenol for the last couple hours." Pt rocking back and forth and wincing. Pt endorses passive SI but contracts for safety. Pt denies HI. Endorses AVH as command AH telling him to hurt himself and seeing shadows. Pt states that he ignores the voices when they tell him to hurt himself.  Pt rates pain 10/10 as a headache at the right side and posterior of his head. Pt rates anxiety and depression both 10/10. Pt given PRNs as needed. ?

## 2022-02-19 NOTE — [facility] Counselor (Signed)
Patient rescinded consent to speak with mother in law.  Patient does not want Mother in law to worry. Patient unable to discharge plan and discussed worries around his health.  Pt reports that he has applied for disability but the case is pending and is waiting for them to finish their review.  Patient worried about several phone calls he has to receive.  He was encouraged to worry about present moments and to focus on himself while he is in the hospital.  ? ?Patient agreed to shelter resources but unsure what his next move after discharge it.  ? ? ?Beda Dula, LCSW, LCAS ?Clincal Social Worker  ?[facility] ? ?

## 2022-02-19 NOTE — [facility] Group Notes (Signed)
Adult Psychoeducational Group Note ? ?Date:  02/19/2022 ?Time:  9:05 AM ? ?Group Topic/Focus:  ?Goals Group:   The focus of this group is to help patients establish daily goals to achieve during treatment and discuss how the patient can incorporate goal setting into their daily lives to aide in recovery. ? ?Participation Level:  Did Not Attend ? ? ?Dub Mikes ?02/19/2022, 9:05 AM ?

## 2022-02-19 NOTE — BHH Group Notes (Signed)
PsychoEducational Group Note- ?The patients were educated on positive reframing techniques, and ways that negative thinking can impact mental health. A poem was read by the [place] in regards to thought patterns. The patients were then asked to participate in relaxation and mindfulness techniques and ended group with deep breathing practice. The patient did not attend group. ?

## 2022-02-19 NOTE — BHH Counselor (Signed)
CSW provided patient with a homeless packet.  ? ? ?Demetri Kerman, LCSW, LCAS ?Clincal Social Worker  ?[facility] ? ?

## 2022-02-19 NOTE — BHH Suicide Risk Assessment (Signed)
BHH INPATIENT:  Family/Significant Other Suicide Prevention Education ? ?Suicide Prevention Education:  ?Education Completed; Sheppard Coil,  [phone] (name of family member/significant other) has been identified by the patient as the family member/significant other with whom the patient will be residing, and identified as the person(s) who will aid the patient in the event of a mental health crisis (suicidal ideations/suicide attempt).  With written consent from the patient, the family member/significant other has been provided the following suicide prevention education, prior to the and/or following the discharge of the patient. ? ?CSW spoke with patient mother in law, Sheppard Coil.  Stanton Kidney reports that she hasn't stayed in contact with as much as she feels like she should but tries to support when she can.  Stanton Kidney reports that he has been struggling since her daughter, his wife, died 2 years ago.  Mother in law is most concerned about his congestive heart failure.  She reports that he has been unable to work since her daughter passed away.  Patient had told her that he was starting to get disability to assist with expenses and insurance.  She is aware that he was staying in a hotel and she reports that she knows that if he doesn't have money that he will probably be going to a shelter.  Mother in law reports no access to guns or weapons.  No additional safety concerns other than his health issues.  ? ?The suicide prevention education provided includes the following: ?Suicide risk factors ?Suicide prevention and interventions ?National Suicide Hotline telephone number ?[facility] assessment telephone number ?[facility] Emergency Assistance 911 ?[place] and/or Residential Mobile Crisis Unit telephone number ? ?Request made of family/significant other to: ?Remove weapons (e.g., guns, rifles, knives), all items previously/currently identified as safety concern.   ?Remove drugs/medications  (over-the-counter, prescriptions, illicit drugs), all items previously/currently identified as a safety concern. ? ?The family member/significant other verbalizes understanding of the suicide prevention education information provided.  The family member/significant other agrees to remove the items of safety concern listed above. ? ?Jenner Rosier E Ellouise Mcwhirter ?02/19/2022, 11:10 AM ?

## 2022-02-19 NOTE — Group Note (Signed)
LCSW Group Therapy Note ? ? ?Group Date: 02/19/2022 ?Start Time: 1300 ?End Time: 1400 ? ?Type of Therapy and Topic: Group Therapy: Control ? ?Participation Level: Active ? ?Description of Group: ?In this group patients will discuss what is out of their control, what is somewhat in their control, and what is within their control.  They will be encouraged to explore what issues they can control and what issues are out of their control within their daily lives. They will be guided to discuss their thoughts, feelings, and behaviors related to these issues. The group will process together ways to better control things that are well within our own control and how to notice and accept the things that are not within our control. This group will be process-oriented, with patients participating in exploration of their own experiences as well as giving and receiving support and challenge from other group members.  During this group 2 worksheets will be provided to each patient to follow along and fill out.  ? ?Therapeutic Goals: ?1. Patient will identify what is within their control and what is not within their control. ?2. Patient will identify their thoughts and feelings about having control over their own lives. ?3. Patient will identify their thoughts and feelings about not having control over everything in their lives.Marland Kitchen ?4. Patient will identify ways that they can have more control over their own lives. ?5. Patient will identify areas were they can allow others to help them or provide assistance. ? ?Summary of Patient Progress:  The Pt attended the group and remained there the entire time.  The Pt accepted the worksheets that were provided and followed along throughout the group.  The Pt participated in the group discussion and was appropriate with their peers.  The Pt shared openly and demonstrated understanding of the topics being discussed.  ? ?Darleen Crocker, LCSWA ?02/19/2022  3:01 PM   ? ?

## 2022-02-19 NOTE — Progress Notes (Addendum)
[facility] MD Progress Note  02/19/2022 12:35 PM Matthew [name].  MRN:  [id number]  Subjective:  Matthew [name] reports: "I am depressed, and I am still hearing the voices."  Today's assessment (02/19/2022): Pt's chart reviewed, case discussed with the treatment team. Pt continues to present with a flat affect and depressed mood, attention to personal hygiene and grooming is fair, eye contact is good, speech is clear & coherent. Thought contents are organized and logical. Pt reports +AH states that the voices are still commanding in nature and telling him to kill himself, and reports suicidal thoughts. He has no suicide plan or intent and is verbally contracting for safety on the unit. He agrees to seek staff assistance in the evident that the voices become louder, or the suicide thoughts worsen. Pt continues to report visual hallucinations of shadows, and states that the last time he had AVH was early this morning before breakfast. He reports paranoia, states that he feels as though people are out to get him, and denies thought broadcasting/thought insertion.    Pt reports that his sleep quality last night was poor, and reports his appetite as fair. Pt reported having dull pain to his left sternum, but stated that it resolved after he used the bathroom, and stated that it is most likely to something that he ate at breakfast. He denies nausea, headache, jaw, or back pain. He reports left toe pain, but states that Tylenol is effective in relieving the pain. Zoloft was increased to 50 mg daily starting yesterday (3/5) for management of depression. Melatonin 5 mg was added for insomnia. Trazodone was discontinued due CHF. We are being cautious not to cause respiratory depression with further increasing Seroquel which is already at 200 mg nightly for management of psychosis. A low dose of Remeron 7.5 mg nightly is being added to help with insomnia, appetite & depression. Na is WNL at 137 from CMP completed on 3/6.  Labs  reviewed: CR is 1.37 on 3/1, repeated and WNL at 1.17. CBC is WNL, Prolactin, lipid panel, TSH completed and WNL. Hemoglobin A1C last drawn on 12/05/21 and was 5.8 as per chart review. EKG from 3/1 with QTC-466 & T wave abnormality. Repeat EKG on 3/4 shows New T wave change in V6 and QTC 449m - patient asymptomatic. Baseline UA completed 3/1, color was straw, but results otherwise WNL.  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 calling [facility] mobile crises hotline to verbalize suicidal ideations with a plan to cut his wrists with a razor.   Principal Problem: MDD (major depressive disorder), recurrent, severe, with psychosis ([place] Diagnosis: Principal Problem:   MDD (major depressive disorder), recurrent, severe, with psychosis ([place] Active Problems:   MDD (major depressive disorder), recurrent episode, severe ([place]  Total Time spent with patient: 20 minutes  Past Psychiatric History: Anxiety & MDD  Past Medical History:  Past Medical History:  Diagnosis Date   Acute combined systolic and diastolic CHF, NYHA class 3 ([place]    a. 02/2014 Echo: EF 25-30%.   Cardiomyopathy ([place]    a. 02/2014 Echo: EF 25-30%, sev glob HK with inferolat HK->AK, mod conc LVH, Gr 2 DD, Mild MR, sev dil LA.   Depression    DM (diabetes mellitus) ([place]    High  cholesterol    Hypertension    Morbid obesity ([place])    Tobacco abuse     Past Surgical History:  Procedure Laterality Date   CATARACT EXTRACTION Right    LEFT HEART CATHETERIZATION WITH CORONARY ANGIOGRAM N/A 03/04/2014   Procedure: LEFT HEART CATHETERIZATION WITH CORONARY ANGIOGRAM;  Surgeon: Burnell Blanks, MD;  Location: [facility] CATH LAB;  Service: Cardiovascular;  Laterality: N/A;   TEE WITHOUT CARDIOVERSION N/A 02/04/2017    Procedure: TRANSESOPHAGEAL ECHOCARDIOGRAM (TEE);  Surgeon: Dorothy Spark, MD;  Location: [facility] ENDOSCOPY;  Service: Cardiovascular;  Laterality: N/A;   Family History:  Family History  Problem Relation Age of Onset   Heart attack Father        died @ 64   Cervical cancer Mother        died @ 87   Family Psychiatric  History: none reported Social History:  Social History   Substance and Sexual Activity  Alcohol Use No     Social History   Substance and Sexual Activity  Drug Use No    Social History   Socioeconomic History   Marital status: Widowed    Spouse name: Not on file   Number of children: 2   Years of education: 14   Highest education level: Not on file  Occupational History   Occupation: [employer]  Tobacco Use   Smoking status: Former    Years: 20.00    Types: Cigarettes   Smokeless tobacco: Former    Quit date: 11/14/2020  Vaping Use   Vaping Use: Never used  Substance and Sexual Activity   Alcohol use: No   Drug use: No   Sexual activity: Yes  Other Topics Concern   Not on file  Social History Narrative   Lives with 2 young children in [place].  Wife died in 05-17-2020.  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   Left-handed   Caffeine: occasional tea   Social Determinants of Radio broadcast assistant Strain: Not on file  Food Insecurity: Not on file  Transportation Needs: Not on file  Physical Activity: Not on file  Stress: Not on file  Social Connections: Not on file   Additional Social History:     Sleep: Poor  Appetite:  Good  Current Medications: Current Facility-Administered Medications  Medication Dose Route Frequency Provider Last Rate Last Admin   acetaminophen (TYLENOL) tablet 650 mg  650 mg Oral Q6H PRN Lucky Rathke, FNP   650 mg at 02/18/22 2034   alum & mag hydroxide-simeth (MAALOX/MYLANTA) 200-200-20 MG/5ML suspension 30 mL  30 mL Oral Q4H PRN Lucky Rathke, FNP       aspirin EC tablet 81 mg  81 mg  Oral Daily Lucky Rathke, FNP   81 mg at 02/19/22 0900   atorvastatin (LIPITOR) tablet 80 mg  80 mg Oral Daily Lucky Rathke, FNP   80 mg at 02/19/22 0900   ezetimibe (ZETIA) tablet 10 mg  10 mg Oral Daily Lucky Rathke, FNP   10 mg at 02/19/22 0900   furosemide (LASIX) tablet 40 mg  40 mg Oral Daily Lucky Rathke, FNP   40 mg at 02/19/22 0900   hydrOXYzine (ATARAX) tablet 25 mg  25 mg Oral TID PRN Lucky Rathke, FNP   25 mg at 02/17/22 2145   ibuprofen (ADVIL) tablet 400 mg  400 mg Oral Q6H PRN Nicholes Rough, NP   400 mg at  02/18/22 2114   isosorbide-hydrALAZINE (BIDIL) 20-37.5 MG per tablet 1 tablet  1 tablet Oral TID Minda Ditto, RPH   1 tablet at 02/19/22 6283   magnesium hydroxide (MILK OF MAGNESIA) suspension 30 mL  30 mL Oral Daily PRN Lucky Rathke, FNP       melatonin tablet 5 mg  5 mg Oral QHS Nkwenti, Doris, NP   5 mg at 02/18/22 2112   QUEtiapine (SEROQUEL) tablet 200 mg  200 mg Oral QHS Nkwenti, Doris, NP   200 mg at 02/18/22 2113   sacubitril-valsartan (ENTRESTO) 49-51 mg per tablet  1 tablet Oral BID Lucky Rathke, FNP   1 tablet at 02/19/22 0900   sertraline (ZOLOFT) tablet 50 mg  50 mg Oral Daily Nicholes Rough, NP   50 mg at 02/19/22 0900   spironolactone (ALDACTONE) tablet 12.5 mg  12.5 mg Oral Daily Lucky Rathke, FNP   12.5 mg at 02/19/22 0900   timolol (TIMOPTIC) 0.5 % ophthalmic solution 1 drop  1 drop Both Eyes QPC breakfast Lucky Rathke, FNP   1 drop at 02/19/22 0900    Lab Results:  Results for orders placed or performed during the hospital encounter of 02/15/22 (from the past 48 hour(s))  Glucose, capillary     Status: Abnormal   Collection Time: 02/18/22 11:26 AM  Result Value Ref Range   Glucose-Capillary 124 (H) 70 - 99 mg/dL    Comment: Glucose reference range applies only to samples taken after fasting for at least 8 hours.  Basic metabolic panel     Status: Abnormal   Collection Time: 02/19/22  6:21 AM  Result Value Ref Range   Sodium 137 135 - 145  mmol/L   Potassium 4.2 3.5 - 5.1 mmol/L   Chloride 105 98 - 111 mmol/L   CO2 27 22 - 32 mmol/L   Glucose, Bld 95 70 - 99 mg/dL    Comment: Glucose reference range applies only to samples taken after fasting for at least 8 hours.   BUN 13 6 - 20 mg/dL   Creatinine, Ser 1.20 0.61 - 1.24 mg/dL   Calcium 8.8 (L) 8.9 - 10.3 mg/dL   GFR, Estimated >60 >60 mL/min    Comment: (NOTE) Calculated using the CKD-EPI Creatinine Equation (2021)    Anion gap 5 5 - 15    Comment: Performed at [facility], [place] [address] [place], [place] [address]    Blood Alcohol level:  Lab Results  Component Value Date   ETH <10 76/54/6503    Metabolic Disorder Labs: Lab Results  Component Value Date   HGBA1C 5.8 12/05/2021   MPG 111.15 12/27/2020   MPG 142.72 07/04/2020   Lab Results  Component Value Date   PROLACTIN 4.8 02/14/2022   Lab Results  Component Value Date   CHOL 166 02/14/2022   TRIG 57 02/14/2022   HDL 54 02/14/2022   CHOLHDL 3.1 02/14/2022   VLDL 11 02/14/2022   LDLCALC 101 (H) 02/14/2022   LDLCALC 40 09/15/2021    Physical Findings: AIMS: Facial and Oral Movements Muscles of Facial Expression: None, normal Lips and Perioral Area: None, normal Jaw: None, normal Tongue: None, normal,Extremity Movements Upper (arms, wrists, hands, fingers): None, normal Lower (legs, knees, ankles, toes): None, normal, Trunk Movements Neck, shoulders, hips: None, normal, Overall Severity Severity of abnormal movements (highest score from questions above): None, normal Incapacitation due to abnormal movements: None, normal Patient's awareness of abnormal movements (rate only patient's report):  No Awareness, Dental Status Current problems with teeth and/or dentures?: No Does patient usually wear dentures?: No  CIWA:  n/a COWS:  n/a AIMS: 0  Musculoskeletal: Strength & Muscle Tone: decreased Gait & Station: unsteady Patient leans: N/A  Psychiatric Specialty  Exam:  Presentation  General Appearance: Appropriate for Environment  Eye Contact:Fair  Speech:Clear and Coherent  Speech Volume:Decreased  Handedness:Right   Mood and Affect  Mood:Depressed  Affect:Congruent   Thought Process  Thought Processes:Coherent  Descriptions of Associations:Intact  Orientation:Full (Time, Place and Person)  Thought Content:Logical  History of Schizophrenia/Schizoaffective disorder:No  Duration of Psychotic Symptoms:No data recorded Hallucinations:Hallucinations: Auditory; Command; Visual Description of Command Hallucinations: voices commanding him to kill self Description of Visual Hallucinations: shadows  Ideas of Reference:Paranoia  Suicidal Thoughts:Suicidal Thoughts: Yes, Active SI Active Intent and/or Plan: Without Intent; Without Plan SI Passive Intent and/or Plan: Without Plan; Without Intent  Homicidal Thoughts:Homicidal Thoughts: No  Sensorium  Memory:Immediate Good  Judgment:Poor  Insight:Fair  Executive Functions  Concentration:Fair  Attention Span:Fair  [place]  Psychomotor Activity  Psychomotor Activity:Psychomotor Activity: Psychomotor Retardation  Assets  Assets:Communication Skills  Sleep  Sleep:Sleep: Poor  Physical Exam: Physical Exam Constitutional:      Appearance: He is ill-appearing.  HENT:     Head: Normocephalic.     Nose: Nose normal. No congestion or rhinorrhea.  Eyes:     Pupils: Pupils are equal, round, and reactive to light.  Pulmonary:     Effort: Pulmonary effort is normal.  Neurological:     Mental Status: He is alert and oriented to person, place, and time.     Sensory: No sensory deficit.  Psychiatric:        Behavior: Behavior normal.   Review of Systems  Constitutional: Negative.  Negative for fever.  HENT: Negative.  Negative for sore throat.   Eyes: Negative.   Respiratory: Negative.  Negative for cough, shortness of  breath and wheezing.   Cardiovascular: Negative.  Negative for chest pain.  Gastrointestinal: Negative.  Negative for heartburn and nausea.  Genitourinary: Negative.   Musculoskeletal: Negative.   Skin: Negative.   Neurological: Negative.  Negative for dizziness and headaches.  Endo/Heme/Allergies: Negative.   Psychiatric/Behavioral:  Positive for depression, hallucinations, substance abuse and suicidal ideas. The patient is nervous/anxious and has insomnia.   Blood pressure (!) 143/99, pulse 67, temperature 97.8 F (36.6 C), resp. rate 18, height '5\' 9"'$  (1.753 m), weight 85.7 kg, SpO2 100 %. Body mass index is 27.91 kg/m.  PLAN Safety and Monitoring: Voluntary admission to inpatient psychiatric unit for safety, stabilization and treatment Daily contact with patient to assess and evaluate symptoms and progress in treatment Patient's case to be discussed in multi-disciplinary team meeting Observation Level : q15 minute checks Vital signs: q12 hours Precautions: suicide, elopement, and assault   Long Term Goal(s): Improvement in symptoms so as ready for discharge   Short Term Goals: Ability to identify changes in lifestyle to reduce recurrence of condition will improve, Ability to verbalize feelings will improve, Ability to disclose and discuss suicidal ideas, Ability to demonstrate self-control will improve, Ability to identify and develop effective coping behaviors will improve, Compliance with prescribed medications will improve, and Ability to identify triggers associated with substance abuse/mental health issues will improve   Physician Treatment Plan for Secondary Diagnosis:  Principal Problem:   MDD (major depressive disorder), recurrent, severe, with psychosis ([place]) Active Problems:   MDD (major depressive disorder), recurrent episode, severe ([place])  Medications MDD recurrent severe with Psychotic Features -Discontinued Prozac on 3/3 -Continue Zoloft to 50 mg daily for  depression  -Continue Seroquel to 200 mg nightly for psychosis  -Start Remeron 7.5 mg nightly for depression, insomnia & appetite stimulation - Continue Melatonin '5mg'$  qhs  Anxiety -Continue Hydroxyzine 25 mg every 6 hours PRN   Medical Medications for CHF, hyperlipidemia, Glaucoma, DM, HTN, OSA, h/o CVA Continue Lasix 40 mg daily Continue Bidil 20-37.5 mg daily Continue Entresto 49-51 mg daily Continue Aldactone 12.5 mg daily  Continue Timolol eye drops daily Continue Lipitor 80 mg daily Continue Zetia 10 mg daily Continue ASA '81mg'$  daily   Hypokalemia on Lasix - resolved - K+ today 4.0  Hyponatremia (Na+ 134 on 02/17/22) - resolved - Monitor with titration up on SSRI - 137 (CMP from 3/6)  Abnormal EKG - Patient currently asymptomatic - repeating EKG today   Other PRNS -Continue Tylenol 650 mg every 6 hours PRN for mild pain -Continue Maalox 30 mg every 4 hrs PRN for indigestion -Continue Milk of Magnesia as needed every 6 hrs for constipation -Discontinued Trazodone '50mg'$  nightly (h/o CHF) -Continue Melatonin 5 mg nightly for insomnia   Discharge Planning: Social work and case management to assist with discharge planning and identification of hospital follow-up needs prior to discharge Estimated LOS: 5-7 days Discharge Concerns: Need to establish a safety plan; Medication compliance and effectiveness Discharge Goals: Return home with outpatient referrals for mental health follow-up including medication management/psychotherapy   Nicholes Rough, NP 02/19/2022, 12:35 PM Patient ID: Matthew [name]., male   DOB: [birth year], 56 y.o.   MRN: [id number]

## 2022-02-19 NOTE — Group Note (Signed)
Recreation Therapy Group Note ? ? ?Group Topic:Stress Management  ?Group Date: 02/19/2022 ?Start Time: 88 ?End Time: 0950 ?Facilitators: Victorino Sparrow, LRT,CTRS ?Location: [place] ? ? ?Goal Area(s) Addresses:  ?Patient will identify positive stress management techniques. ?Patient will identify benefits of using stress management post d/c. ? ?Group Description:  Meditation.  LRT played a meditation that focused on using your day to restore, forgive and show grace to yourself and others.  The meditation also focused on loving every part of yourself flaws and all.  Patients were to listen and follow along with the meditation as it played to fully engage in activity. ? ? ?Affect/Mood: N/A ?  ?Participation Level: Did not attend ?  ? ?Clinical Observations/Individualized Feedback: Pt was called out of group before it started for medications and to meet with doctor.   ? ? ?Plan: Continue to engage patient in RT group sessions 2-3x/week. ? ? ?Victorino Sparrow, LRT,CTRS ?02/19/2022 1:05 PM ?

## 2022-02-19 NOTE — Plan of Care (Signed)
Nurse discussed coping skills with patient.  

## 2022-02-19 NOTE — Progress Notes (Signed)
D:  Patient denied HI.  Stated he does have SI thoughts, after discharge from [facility].  Does hear voices to hurt self.  Does see shadows. ?A:  Medications administered per MD orders.  Emotional support and encouragement given patient. ?R:  Safety maintained with 15 minute checks. ? ?

## 2022-02-20 NOTE — BHH Group Notes (Signed)
?  Spiritual care group on grief and loss facilitated by chaplain Janne Napoleon, [facility]  ? ?Group Goal:  ? ?Support / Education around grief and loss  ? ?Members engage in facilitated group support and psycho-social education.  ? ?Group Description:  ? ?Following introductions and group rules, group members engaged in facilitated group dialog and support around topic of loss, with particular support around experiences of loss in their lives. Group Identified types of loss (relationships / self / things) and identified patterns, circumstances, and changes that precipitate losses. Reflected on thoughts / feelings around loss, normalized grief responses, and recognized variety in grief experience. Group noted Worden's four tasks of grief in discussion.  ? ?Group drew on Adlerian / Rogerian, narrative, MI,  ? ?Patient Progress: Patient arrived partway through group. Showed some engagement, but participation was limited. ? ?[employer], Bcc ?Pager, [phone] ? ?

## 2022-02-20 NOTE — Progress Notes (Signed)
D: Patient reports he only slept 2 hours last night so sleep was poor. He requested sleep medication but it was not helpful. Appetite is fair. Reports low energy level and poor concentration. Rates his depression a 9/10. Rates his hopelessness a 9/10 and anxiety a 9/10. States he is having thoughts of self harm but contracts for safety. ? ?A: Admits to thoughts of self harm but denies homicidal ideation. Admits to having auditory and visual hallucinations but did not give details. Pain reported in back and left foot toes but states he does not want any pain medication at this time. His goal for today is to have good thoughts and work on good thoughts.  ? ?R: Staff will continue to encourage patient to use coping skills, encourage patient to talk to staff instead of self-harming and report any new suicidal thoughts  ?

## 2022-02-20 NOTE — Plan of Care (Signed)
Nurse discussed coping skills with patient.  

## 2022-02-20 NOTE — Progress Notes (Signed)
?   02/20/22 2025  ?Psych Admission Type (Psych Patients Only)  ?Admission Status Voluntary  ?Psychosocial Assessment  ?Patient Complaints Anxiety;Depression;Other (Comment) ?(headaches)  ?Eye Contact Fair  ?Facial Expression Anxious  ?Affect Anxious;Depressed;Apprehensive  ?Speech Slow  ?Interaction Assertive;Guarded  ?Motor Activity Slow  ?Appearance/Hygiene Unremarkable  ?Behavior Characteristics Cooperative;Anxious  ?Mood Anxious;Depressed  ?Thought Process  ?Coherency Blocking  ?Content WDL  ?Delusions None reported or observed  ?Perception Hallucinations  ?Hallucination Auditory;Command;Visual ?(command AH and VH of shadows)  ?Judgment Impaired  ?Confusion None  ?Danger to Self  ?Current suicidal ideation? Denies  ?Self-Injurious Behavior No self-injurious ideation or behavior indicators observed or expressed   ?Danger to Others  ?Danger to Others None reported or observed  ? ?Pt seen in the dayroom. Pt denies SI, HI. Pt endorses command AH and seeing shadows. Pt rates pain 9/10 as a headache and painful corns on his right foot. Says that the headaches will go away with pain meds but come back. "I think it's my medicine. I was off my meds and then they put me back on them and I have started having headaches." Pt doesn't know which meds could be causing the headaches. Pt says that he takes eye drops for increased intraocular pressure also. Denies any vision changes. Pt rates anxiety and depression both 9/10. ?

## 2022-02-20 NOTE — Plan of Care (Signed)
?  Problem: Education: ?Goal: Ability to state activities that reduce stress will improve ?Outcome: Progressing ?  ?Problem: Education: ?Goal: Ability to state activities that reduce stress will improve ?Outcome: Progressing ?  ?Problem: Education: ?Goal: Utilization of techniques to improve thought processes will improve ?Outcome: Progressing ?Goal: Knowledge of the prescribed therapeutic regimen will improve ?Outcome: Progressing ?  ?Problem: Education: ?Goal: Knowledge of [facility] Education information/materials will improve ?Outcome: Progressing ?Goal: Emotional status will improve ?Outcome: Progressing ?Goal: Mental status will improve ?Outcome: Progressing ?Goal: Verbalization of understanding the information provided will improve ?Outcome: Progressing ?  ?Problem: Education: ?Goal: Ability to make informed decisions regarding treatment will improve ?Outcome: Progressing ?  ?Problem: Education: ?Goal: Ability to make informed decisions regarding treatment will improve ?Outcome: Progressing ?  ?

## 2022-02-20 NOTE — Group Note (Signed)
Date:  02/20/2022 ?Time:  11:25 AM ? ?Group Topic/Focus:  ?Goals Group:   The focus of this group is to help patients establish daily goals to achieve during treatment and discuss how the patient can incorporate goal setting into their daily lives to aide in recovery. ?Orientation:   The focus of this group is to educate the patient on the purpose and policies of crisis stabilization and provide a format to answer questions about their admission.  The group details unit policies and expectations of patients while admitted. ? ? ? ?Participation Level:  Active ? ?Participation Quality:  Appropriate ? ?Affect:  Appropriate ? ?Cognitive:  Appropriate ? ?Insight: Appropriate ? ?Engagement in Group:  Engaged ? ?Modes of Intervention:  Discussion ? ?Additional Comments:   ? ?Garvin Fila ?02/20/2022, 11:25 AM ? ?

## 2022-02-20 NOTE — [facility] Group Notes (Signed)
Adult Psychoeducational Group Note ? ?Date:  02/20/2022 ?Time:  10:21 PM ? ?Group Topic/Focus:  ?Wrap-Up Group:   The focus of this group is to help patients review their daily goal of treatment and discuss progress on daily workbooks. ? ?Participation Level:  Minimal ? ?Participation Quality:  Appropriate ? ?Affect:  Appropriate ?Cognitive:  Alert ? ?Insight: Improving ? ?Engagement in Group:  Limited ? ?Modes of Intervention:  Discussion ? ?Additional Comments:  Pt rated his day as 9/10 said that he had the opportunity to laugh earlier which helped to brighten his day. Discussed the importance of discharge planning, the purpose of following the plan and how communicating with family and service providers will assist with maintaining stability from a mental health standpoint. ?Herbert Seta Cassandra ?02/20/2022, 10:21 PM ?

## 2022-02-20 NOTE — Progress Notes (Signed)
[facility] MD Progress Note  02/20/2022 12:35 PM Matthew [name].  MRN:  [id number]  Subjective:  Matthew [name] reports: "I am still hearing the voices, and they are telling me to do the same thing. To kill myself."  Today's assessment (02/20/2022): Pt's chart reviewed, case discussed with the treatment team. Pt continues to present with a flat affect and depressed mood, attention to personal hygiene and grooming is fair, eye contact is good, speech is clear & coherent. Thought contents are organized and logical. Pt reports +AH states that the voices are still commanding in nature and telling him to kill himself, and continues to report suicidal thoughts. He has no suicide plan or intent and is verbally contracting for safety on the unit. He agrees to seek staff assistance in the event that the voices become louder, or the suicide thoughts worsen. Pt continues to report visual hallucinations of shadows & people, and states that the last time he had AVH was early this morning. He continues to report paranoia, and states that he feels as though people are out to get him, and states it is not a particular person, but people in general. Pt denies thought broadcasting/thought insertion.  He continues to report that his depression is same as on admission, and denies any improvements.  Pt reports that his sleep quality last night was poor because he kept waking up and did not have a restful night's sleep. Pt reports his appetite as fair. He reports left toe pain, but states that Tylenol is effective in relieving the pain. Toes examined, and middle toes dry, crusty and flaky with no break in the skin. No redness/edema noted.  Zoloft was increased to 50 mg daily starting 3/5 for management of depression. Will increase Zoloft to 75 mg daily effective tomorrow morning (02/21/22). Melatonin 5 mg was added for insomnia. Trazodone was discontinued due CHF. We are being cautious not to cause respiratory depression with further increasing  Seroquel which is already at 200 mg nightly for management of psychosis. A low dose of Remeron 7.5 mg nightly is being added to help with insomnia, appetite & depression. Na is WNL at 137 from CMP completed on 3/6.  Labs reviewed: CR is 1.37 on 3/1, repeated and WNL at 1.17. CBC is WNL, Prolactin, lipid panel, TSH completed and WNL. Hemoglobin A1C last drawn on 12/05/21 and was 5.8 as per chart review. EKG from 3/1 with QTC-466 & T wave abnormality. Repeat EKG on 3/4 shows New T wave change in V6 and QTC 473m - patient asymptomatic. Baseline UA completed 3/1, color was straw, but results otherwise WNL.  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   Principal Problem: MDD (major depressive disorder), recurrent, severe, with psychosis ([place] Diagnosis: Principal Problem:   MDD (major depressive disorder), recurrent, severe, with psychosis ([place] Active Problems:   MDD (major depressive disorder), recurrent episode, severe ([place]  Total Time spent with patient: 20 minutes  Past Psychiatric History: Anxiety & MDD  Past Medical History:  Past Medical History:  Diagnosis Date   Acute combined systolic and diastolic CHF, NYHA class 3 ([place]    a. 02/2014 Echo: EF 25-30%.  Cardiomyopathy ([place])    a. 02/2014 Echo: EF 25-30%, sev glob HK with inferolat HK->AK, mod conc LVH, Gr 2 DD, Mild MR, sev dil LA.   Depression    DM (diabetes mellitus) ([place])    High cholesterol    Hypertension    Morbid obesity ([place])    Tobacco abuse     Past Surgical History:  Procedure Laterality Date   CATARACT EXTRACTION Right    LEFT HEART CATHETERIZATION WITH CORONARY ANGIOGRAM N/A  03/04/2014   Procedure: LEFT HEART CATHETERIZATION WITH CORONARY ANGIOGRAM;  Surgeon: Burnell Blanks, MD;  Location: [facility] CATH LAB;  Service: Cardiovascular;  Laterality: N/A;   TEE WITHOUT CARDIOVERSION N/A 02/04/2017   Procedure: TRANSESOPHAGEAL ECHOCARDIOGRAM (TEE);  Surgeon: Dorothy Spark, MD;  Location: [facility] ENDOSCOPY;  Service: Cardiovascular;  Laterality: N/A;   Family History:  Family History  Problem Relation Age of Onset   Heart attack Father        died @ 66   Cervical cancer Mother        died @ 47   Family Psychiatric  History: none reported Social History:  Social History   Substance and Sexual Activity  Alcohol Use No     Social History   Substance and Sexual Activity  Drug Use No    Social History   Socioeconomic History   Marital status: Widowed    Spouse name: Not on file   Number of children: 2   Years of education: 14   Highest education level: Not on file  Occupational History   Occupation: [employer]  Tobacco Use   Smoking status: Former    Years: 20.00    Types: Cigarettes   Smokeless tobacco: Former    Quit date: 11/14/2020  Vaping Use   Vaping Use: Never used  Substance and Sexual Activity   Alcohol use: No   Drug use: No   Sexual activity: Yes  Other Topics Concern   Not on file  Social History Narrative   Lives with 2 young children in [place].  Wife died in May 17, 2020.  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   Left-handed   Caffeine: occasional tea   Social Determinants of Radio broadcast assistant Strain: Not on file  Food Insecurity: Not on file  Transportation Needs: Not on file  Physical Activity: Not on file  Stress: Not on file  Social Connections: Not on file   Additional Social History:     Sleep: Poor  Appetite:  Good  Current Medications: Current Facility-Administered Medications  Medication Dose Route Frequency Provider Last Rate Last Admin   acetaminophen (TYLENOL) tablet 650  mg  650 mg Oral Q6H PRN Lucky Rathke, FNP   650 mg at 02/20/22 0630   alum & mag hydroxide-simeth (MAALOX/MYLANTA) 200-200-20 MG/5ML suspension 30 mL  30 mL Oral Q4H PRN Lucky Rathke, FNP       aspirin EC tablet 81 mg  81 mg Oral Daily Lucky Rathke, FNP   81 mg at 02/20/22 0900   atorvastatin (LIPITOR) tablet 80 mg  80 mg Oral Daily Lucky Rathke, FNP   80 mg at 02/20/22 0900   ezetimibe (ZETIA) tablet 10 mg  10 mg Oral Daily Lucky Rathke, FNP   10 mg at 02/20/22 0900   furosemide (LASIX) tablet 40 mg  40 mg Oral Daily Lucky Rathke, FNP   40 mg at 02/20/22 0900   hydrOXYzine (ATARAX)  tablet 25 mg  25 mg Oral TID PRN Lucky Rathke, FNP   25 mg at 02/19/22 2007   ibuprofen (ADVIL) tablet 400 mg  400 mg Oral Q6H PRN Nicholes Rough, NP   400 mg at 02/18/22 2114   isosorbide-hydrALAZINE (BIDIL) 20-37.5 MG per tablet 1 tablet  1 tablet Oral TID Minda Ditto, RPH   1 tablet at 02/20/22 1216   magnesium hydroxide (MILK OF MAGNESIA) suspension 30 mL  30 mL Oral Daily PRN Lucky Rathke, FNP       melatonin tablet 5 mg  5 mg Oral QHS Nicholes Rough, NP   5 mg at 02/19/22 2134   QUEtiapine (SEROQUEL) tablet 200 mg  200 mg Oral QHS Nicholes Rough, NP   200 mg at 02/19/22 2134   sacubitril-valsartan (ENTRESTO) 49-51 mg per tablet  1 tablet Oral BID Lucky Rathke, FNP   1 tablet at 02/20/22 0900   sertraline (ZOLOFT) tablet 50 mg  50 mg Oral Daily Nicholes Rough, NP   50 mg at 02/20/22 0900   spironolactone (ALDACTONE) tablet 12.5 mg  12.5 mg Oral Daily Lucky Rathke, FNP   12.5 mg at 02/20/22 0900   timolol (TIMOPTIC) 0.5 % ophthalmic solution 1 drop  1 drop Both Eyes QPC breakfast Lucky Rathke, FNP   1 drop at 02/20/22 0920    Lab Results:  Results for orders placed or performed during the hospital encounter of 02/15/22 (from the past 48 hour(s))  Basic metabolic panel     Status: Abnormal   Collection Time: 02/19/22  6:21 AM  Result Value Ref Range   Sodium 137 135 - 145 mmol/L   Potassium 4.2  3.5 - 5.1 mmol/L   Chloride 105 98 - 111 mmol/L   CO2 27 22 - 32 mmol/L   Glucose, Bld 95 70 - 99 mg/dL    Comment: Glucose reference range applies only to samples taken after fasting for at least 8 hours.   BUN 13 6 - 20 mg/dL   Creatinine, Ser 1.20 0.61 - 1.24 mg/dL   Calcium 8.8 (L) 8.9 - 10.3 mg/dL   GFR, Estimated >60 >60 mL/min    Comment: (NOTE) Calculated using the CKD-EPI Creatinine Equation (2021)    Anion gap 5 5 - 15    Comment: Performed at [facility], [place] [address] [place], [place] [address]    Blood Alcohol level:  Lab Results  Component Value Date   ETH <10 25/95/6387    Metabolic Disorder Labs: Lab Results  Component Value Date   HGBA1C 5.8 12/05/2021   MPG 111.15 12/27/2020   MPG 142.72 07/04/2020   Lab Results  Component Value Date   PROLACTIN 4.8 02/14/2022   Lab Results  Component Value Date   CHOL 166 02/14/2022   TRIG 57 02/14/2022   HDL 54 02/14/2022   CHOLHDL 3.1 02/14/2022   VLDL 11 02/14/2022   LDLCALC 101 (H) 02/14/2022   LDLCALC 40 09/15/2021    Physical Findings: AIMS: Facial and Oral Movements Muscles of Facial Expression: None, normal Lips and Perioral Area: None, normal Jaw: None, normal Tongue: None, normal,Extremity Movements Upper (arms, wrists, hands, fingers): None, normal Lower (legs, knees, ankles, toes): None, normal, Trunk Movements Neck, shoulders, hips: None, normal, Overall Severity Severity of abnormal movements (highest score from questions above): None, normal Incapacitation due to abnormal movements: None, normal Patient's awareness of abnormal movements (rate only patient's report): No Awareness, Dental Status Current problems with teeth  and/or dentures?: No Does patient usually wear dentures?: No  CIWA:  n/a COWS:  n/a AIMS: 0  Musculoskeletal: Strength & Muscle Tone: decreased Gait & Station: unsteady Patient leans: N/A  Psychiatric Specialty Exam:  Presentation  General  Appearance: Fairly Groomed  Eye Contact:Good  Speech:Clear and Coherent  Speech Volume:Normal  Handedness:Right   Mood and Affect  Mood:Depressed  Affect:Congruent; Flat; Depressed   Thought Process  Thought Processes:Coherent  Descriptions of Associations:Intact  Orientation:Full (Time, Place and Person)  Thought Content:Logical  History of Schizophrenia/Schizoaffective disorder:No  Duration of Psychotic Symptoms:No data recorded Hallucinations:Hallucinations: Auditory; Command Description of Command Hallucinations: voices kill self Description of Visual Hallucinations: shadows  Ideas of Reference:None  Suicidal Thoughts:Suicidal Thoughts: Yes, Active SI Active Intent and/or Plan: Without Intent; Without Plan SI Passive Intent and/or Plan: Without Intent; Without Plan  Homicidal Thoughts:Homicidal Thoughts: No  Sensorium  Memory:Immediate Good  Judgment:Fair  Insight:Fair  Executive Functions  Concentration:Fair  Attention Span:Fair  [place]  Psychomotor Activity  Psychomotor Activity:Psychomotor Activity: Psychomotor Retardation  Assets  Assets:Communication Skills  Sleep  Sleep:Sleep: Poor  Physical Exam: Physical Exam Constitutional:      Appearance: He is ill-appearing.  HENT:     Head: Normocephalic.     Nose: Nose normal. No congestion or rhinorrhea.  Eyes:     Pupils: Pupils are equal, round, and reactive to light.  Pulmonary:     Effort: Pulmonary effort is normal.  Neurological:     Mental Status: He is alert and oriented to person, place, and time.     Sensory: No sensory deficit.  Psychiatric:        Behavior: Behavior normal.   Review of Systems  Constitutional: Negative.  Negative for fever.  HENT: Negative.  Negative for sore throat.   Eyes: Negative.   Respiratory: Negative.  Negative for cough, shortness of breath and wheezing.   Cardiovascular: Negative.  Negative for  chest pain.  Gastrointestinal: Negative.  Negative for heartburn and nausea.  Genitourinary: Negative.   Musculoskeletal: Negative.   Skin: Negative.   Neurological: Negative.  Negative for dizziness and headaches.  Endo/Heme/Allergies: Negative.   Psychiatric/Behavioral:  Positive for depression, hallucinations, substance abuse and suicidal ideas. The patient is nervous/anxious and has insomnia.   Blood pressure (!) 152/103, pulse 66, temperature 97.7 F (36.5 C), temperature source Oral, resp. rate 18, height '5\' 9"'$  (1.753 m), weight 85.7 kg, SpO2 100 %. Body mass index is 27.91 kg/m.  PLAN Safety and Monitoring: Voluntary admission to inpatient psychiatric unit for safety, stabilization and treatment Daily contact with patient to assess and evaluate symptoms and progress in treatment Patient's case to be discussed in multi-disciplinary team meeting Observation Level : q15 minute checks Vital signs: q12 hours Precautions: suicide, elopement, and assault   Long Term Goal(s): Improvement in symptoms so as ready for discharge   Short Term Goals: Ability to identify changes in lifestyle to reduce recurrence of condition will improve, Ability to verbalize feelings will improve, Ability to disclose and discuss suicidal ideas, Ability to demonstrate self-control will improve, Ability to identify and develop effective coping behaviors will improve, Compliance with prescribed medications will improve, and Ability to identify triggers associated with substance abuse/mental health issues will improve   Physician Treatment Plan for Secondary Diagnosis:  Principal Problem:   MDD (major depressive disorder), recurrent, severe, with psychosis ([place]) Active Problems:   MDD (major depressive disorder), recurrent episode, severe ([place])   Medications MDD recurrent severe with Psychotic Features -  Discontinued Prozac on 3/3 -Increase Zoloft to 75 mg daily for depression  -Continue Seroquel to 200 mg  nightly for psychosis  -Continue Remeron 7.5 mg nightly for depression, insomnia & appetite stimulation - Continue Melatonin '5mg'$  qhs  Anxiety -Continue Hydroxyzine 25 mg every 6 hours PRN   Medical Medications for CHF, hyperlipidemia, Glaucoma, DM, HTN, OSA, h/o CVA Continue Lasix 40 mg daily Continue Bidil 20-37.5 mg daily Continue Entresto 49-51 mg daily Continue Aldactone 12.5 mg daily  Continue Timolol eye drops daily Continue Lipitor 80 mg daily Continue Zetia 10 mg daily Continue ASA '81mg'$  daily   Hypokalemia on Lasix - resolved - K+ today 4.0  Hyponatremia (Na+ 134 on 02/17/22) - resolved - Monitor with titration up on SSRI - 137 (CMP from 3/6)  Abnormal EKG - Patient currently asymptomatic - repeating EKG today   Other PRNS -Continue Tylenol 650 mg every 6 hours PRN for mild pain -Continue Maalox 30 mg every 4 hrs PRN for indigestion -Continue Milk of Magnesia as needed every 6 hrs for constipation -Discontinued Trazodone '50mg'$  nightly (h/o CHF) -Continue Melatonin 5 mg nightly for insomnia   Discharge Planning: Social work and case management to assist with discharge planning and identification of hospital follow-up needs prior to discharge Estimated LOS: 5-7 days Discharge Concerns: Need to establish a safety plan; Medication compliance and effectiveness Discharge Goals: Return home with outpatient referrals for mental health follow-up including medication management/psychotherapy   Nicholes Rough, NP 02/20/2022, 12:35 PM Patient ID: Matthew [name]., male   DOB: 1966-04-29, 56 y.o.   MRN: [id number]

## 2022-02-20 NOTE — Progress Notes (Signed)
D:  Patient's self inventory sheet, patient slept about 2 hours last night, sleep medication hot helpful.  Fair appetite, low energy level, poor concentration.  Rated depression, hopeless and anxiety 9.  Denied withdrawals.  SI, not here, contracts for safety.  SI, after discharge.  Physical problems, back, legs, toes.  Worst pain #9 in past 24 hours.  Pain medicine is helpful.  Goal is have good thoughts.  Plans to work on good thoughts.  No discharge plans. ?A:  Medications administered per MD orders.  Emotional support and encouragement given patient. ?R:  Safety maintained with 15 minute checks. ?

## 2022-02-20 NOTE — [facility] Counselor (Signed)
CSW received phone call from patient mother in law.  Patient has rescinded consent to speak to her so this CSW has been unable to call her back.   ? ? ?Shine Mikes, LCSW, LCAS ?Clincal Social Worker  ?[facility] ? ?

## 2022-02-20 NOTE — Progress Notes (Signed)
?   02/20/22 7703  ?Vital Signs  ?Pulse Rate 70  ?BP (!) 141/100  ?BP Location Left Arm  ?BP Method Automatic  ?Patient Position (if appropriate) Standing  ? ?Pt BP elevated but not outside of his normal readings. Pt c/o headache again 10/10. Pt asked for Tylenol. Offered to give BP meds early but pt refused. Pt counseled that if his headache does not improve after breakfast, we can then give the meds. Pt agreeable. ?

## 2022-02-21 ENCOUNTER — Encounter (HOSPITAL_COMMUNITY): Payer: Self-pay

## 2022-02-21 MED ORDER — SERTRALINE HCL 50 MG PO TABS
75.0000 mg | ORAL_TABLET | Freq: Every day | ORAL | Status: DC
  Administered 2022-02-22 – 2022-02-26 (×5): 75 mg via ORAL
  Filled 2022-02-21 (×7): qty 1

## 2022-02-21 MED ORDER — OLANZAPINE 2.5 MG PO TABS
2.5000 mg | ORAL_TABLET | Freq: Every day | ORAL | Status: DC
  Administered 2022-02-21 – 2022-02-26 (×6): 2.5 mg via ORAL
  Filled 2022-02-21 (×6): qty 1
  Filled 2022-02-21: qty 7
  Filled 2022-02-21 (×4): qty 1
  Filled 2022-02-21: qty 7

## 2022-02-21 MED ORDER — HYDROCERIN EX CREA
TOPICAL_CREAM | Freq: Two times a day (BID) | CUTANEOUS | Status: DC
  Administered 2022-02-22 – 2022-02-24 (×3): 1 via TOPICAL
  Filled 2022-02-21: qty 113

## 2022-02-21 MED ORDER — OLANZAPINE 5 MG PO TABS
5.0000 mg | ORAL_TABLET | Freq: Every day | ORAL | Status: DC
  Administered 2022-02-21: 5 mg via ORAL
  Filled 2022-02-21 (×3): qty 1

## 2022-02-21 MED ORDER — SERTRALINE HCL 25 MG PO TABS
25.0000 mg | ORAL_TABLET | Freq: Once | ORAL | Status: AC
  Administered 2022-02-21: 25 mg via ORAL
  Filled 2022-02-21: qty 1

## 2022-02-21 NOTE — Progress Notes (Signed)
[facility] MD Progress Note  02/21/2022 2:31 PM Matthew Heman.  MRN:  [id number]  Subjective:  Matthew [name] reports: "Everything is the same as it was yesterday. I am still suicidal and I am still hearing the voices. I am also very anxious today."  Today's assessment (02/21/2022): Pt's chart reviewed, case discussed with the treatment team. Pt continues to present with a flat affect and depressed mood, attention to personal hygiene and grooming is fair, eye contact is good, speech is clear & coherent. Thought contents are organized and logical. Pt is continuing to  report +AH states that the voices are still commanding in nature and telling him to kill himself, and he continues to report suicidal thoughts. He has no suicide plan or intent and is verbally contracting for safety on the unit. He agrees to seek staff assistance in the event that the voices become louder, or the suicide thoughts worsen. Pt is also continuing to report visual hallucinations of shadows & people standing behind him.  He also continues to report paranoia, and states that he feels as though people in general, are out to get him. Pt denies thought broadcasting/thought insertion.  He continues to report that his depression is same as on admission, and denies any improvements.  Pt reports that his sleep quality last night was poor because he kept waking up and did not have a restful night's sleep. As per flow sheets, he slept a total of 6.5 hrs. Pt reports his appetite as fair. He reports left toe pain, but states that Tylenol is effective in relieving the pain. Toes examined, and middle toes dry, crusty and flaky with no break in the skin. No redness/edema noted.  Will order Eucerin cream for pt to use on feet for dryness relieve. Pt is asking a Podiatrist to see him here at [facility], but has been educated that this will have to be followed up on an outpatient basis. Pt is tolerating the increase in the dose of Zoloft to 75 mg daily, denies any  medication side effects, but continues to verbalize being depressed. He is also continuing to report insomnia, even though he is taking Melatonin 5 mg nightly. Trazodone was discontinued due CHF. Will discontinue Seroquel due to ineffectiveness in managing pt's symptoms, and start Zyprexa 5 mg nightly and 2.5 mg in the mornings for management of depression, anxiety and auditory hallucinations. Pt is also on a low dose of Remeron 7.5 mg nightly to help with insomnia, appetite & depression. Na is WNL at 137 from CMP completed on 3/6.   Labs reviewed: CR is 1.37 on 3/1, repeated and WNL at 1.17. CBC is WNL, Prolactin, lipid panel, TSH completed and WNL. Hemoglobin A1C last drawn on 12/05/21 and was 5.8 as per chart review. EKG from 3/1 with QTC-466 & T wave abnormality. Repeat EKG on 3/4 shows New T wave change in V6 and QTC 477m - patient asymptomatic. Baseline UA completed 3/1, color was straw, but results otherwise WNL. Will complete MOCA on pt.  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   Principal Problem: MDD (major depressive disorder), recurrent, severe, with psychosis ([place] Diagnosis: Principal Problem:  MDD (major depressive disorder), recurrent, severe, with psychosis ([place]) Active Problems:   MDD (major depressive disorder), recurrent episode, severe ([place])  Total Time spent with patient: 20 minutes  Past Psychiatric History: Anxiety & MDD  Past Medical History:  Past Medical History:  Diagnosis Date   Acute combined systolic and diastolic CHF, NYHA class 3 ([place])    a. 02/2014 Echo: EF 25-30%.   Cardiomyopathy ([place])    a. 02/2014 Echo: EF 25-30%, sev  glob HK with inferolat HK->AK, mod conc LVH, Gr 2 DD, Mild MR, sev dil LA.   Depression    DM (diabetes mellitus) ([place])    High cholesterol    Hypertension    Morbid obesity ([place])    Tobacco abuse     Past Surgical History:  Procedure Laterality Date   CATARACT EXTRACTION Right    LEFT HEART CATHETERIZATION WITH CORONARY ANGIOGRAM N/A 03/04/2014   Procedure: LEFT HEART CATHETERIZATION WITH CORONARY ANGIOGRAM;  Surgeon: Burnell Blanks, MD;  Location: [facility] CATH LAB;  Service: Cardiovascular;  Laterality: N/A;   TEE WITHOUT CARDIOVERSION N/A 02/04/2017   Procedure: TRANSESOPHAGEAL ECHOCARDIOGRAM (TEE);  Surgeon: Dorothy Spark, MD;  Location: [facility] ENDOSCOPY;  Service: Cardiovascular;  Laterality: N/A;   Family History:  Family History  Problem Relation Age of Onset   Heart attack Father        died @ 12   Cervical cancer Mother        died @ 5   Family Psychiatric  History: none reported Social History:  Social History   Substance and Sexual Activity  Alcohol Use No     Social History   Substance and Sexual Activity  Drug Use No    Social History   Socioeconomic History   Marital status: Widowed    Spouse name: Not on file   Number of children: 2   Years of education: 14   Highest education level: Not on file  Occupational History   Occupation: [employer]  Tobacco Use   Smoking status: Former    Years: 20.00    Types: Cigarettes   Smokeless tobacco: Former    Quit date: 11/14/2020  Vaping Use   Vaping Use: Never used  Substance and Sexual Activity   Alcohol use: No   Drug use: No   Sexual activity: Yes  Other Topics Concern   Not on file  Social History Narrative   Lives with 2 young children in [place].  Wife died in 05-15-20.  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   Left-handed   Caffeine: occasional tea   Social Determinants of Radio broadcast assistant Strain: Not on file  Food Insecurity: Not on file   Transportation Needs: Not on file  Physical Activity: Not on file  Stress: Not on file  Social Connections: Not on file   Additional Social History:     Sleep: Poor  Appetite:  Good  Current Medications: Current Facility-Administered Medications  Medication Dose Route Frequency Provider Last Rate Last Admin   acetaminophen (TYLENOL) tablet 650 mg  650 mg Oral Q6H PRN Lucky Rathke, FNP   650 mg at 02/20/22 1721   alum & mag hydroxide-simeth (MAALOX/MYLANTA) 200-200-20 MG/5ML suspension 30 mL  30 mL Oral Q4H PRN Lucky Rathke, FNP       aspirin EC tablet 81 mg  81 mg Oral Daily Lucky Rathke, FNP   81 mg at 02/21/22 0811   atorvastatin (LIPITOR)  tablet 80 mg  80 mg Oral Daily Lucky Rathke, FNP   80 mg at 02/21/22 0810   ezetimibe (ZETIA) tablet 10 mg  10 mg Oral Daily Lucky Rathke, FNP   10 mg at 02/21/22 1975   furosemide (LASIX) tablet 40 mg  40 mg Oral Daily Lucky Rathke, FNP   40 mg at 02/21/22 0810   hydrOXYzine (ATARAX) tablet 25 mg  25 mg Oral TID PRN Lucky Rathke, FNP   25 mg at 02/20/22 1720   ibuprofen (ADVIL) tablet 400 mg  400 mg Oral Q6H PRN Nicholes Rough, NP   400 mg at 02/20/22 2126   isosorbide-hydrALAZINE (BIDIL) 20-37.5 MG per tablet 1 tablet  1 tablet Oral TID Minda Ditto, RPH   1 tablet at 02/21/22 1215   magnesium hydroxide (MILK OF MAGNESIA) suspension 30 mL  30 mL Oral Daily PRN Lucky Rathke, FNP       melatonin tablet 5 mg  5 mg Oral QHS Yitty Roads, NP   5 mg at 02/20/22 2126   OLANZapine (ZYPREXA) tablet 2.5 mg  2.5 mg Oral Daily Tenoch Mcclure, Tamela Oddi, NP       OLANZapine (ZYPREXA) tablet 5 mg  5 mg Oral QHS Dvora Buitron, NP       sacubitril-valsartan (ENTRESTO) 49-51 mg per tablet  1 tablet Oral BID Lucky Rathke, FNP   1 tablet at 02/21/22 0811   [START ON 02/22/2022] sertraline (ZOLOFT) tablet 75 mg  75 mg Oral Daily Rigo Letts, NP       spironolactone (ALDACTONE) tablet 12.5 mg  12.5 mg Oral Daily Beatriz Stallion L, FNP   12.5 mg at 02/21/22 0811    timolol (TIMOPTIC) 0.5 % ophthalmic solution 1 drop  1 drop Both Eyes QPC breakfast Lucky Rathke, FNP   1 drop at 02/21/22 8832   Lab Results:  No results found for this or any previous visit (from the past 48 hour(s)).  Blood Alcohol level:  Lab Results  Component Value Date   ETH <10 54/98/2641   Metabolic Disorder Labs: Lab Results  Component Value Date   HGBA1C 5.8 12/05/2021   MPG 111.15 12/27/2020   MPG 142.72 07/04/2020   Lab Results  Component Value Date   PROLACTIN 4.8 02/14/2022   Lab Results  Component Value Date   CHOL 166 02/14/2022   TRIG 57 02/14/2022   HDL 54 02/14/2022   CHOLHDL 3.1 02/14/2022   VLDL 11 02/14/2022   LDLCALC 101 (H) 02/14/2022   LDLCALC 40 09/15/2021   Physical Findings: AIMS: Facial and Oral Movements Muscles of Facial Expression: None, normal Lips and Perioral Area: None, normal Jaw: None, normal Tongue: None, normal,Extremity Movements Upper (arms, wrists, hands, fingers): None, normal Lower (legs, knees, ankles, toes): None, normal, Trunk Movements Neck, shoulders, hips: None, normal, Overall Severity Severity of abnormal movements (highest score from questions above): None, normal Incapacitation due to abnormal movements: None, normal Patient's awareness of abnormal movements (rate only patient's report): No Awareness, Dental Status Current problems with teeth and/or dentures?: No Does patient usually wear dentures?: No  CIWA:  n/a COWS:  n/a AIMS: 0  Musculoskeletal: Strength & Muscle Tone: decreased Gait & Station: unsteady Patient leans: N/A  Psychiatric Specialty Exam:  Presentation  General Appearance: Appropriate for Environment  Eye Contact:Fair  Speech:Clear and Coherent  Speech Volume:Decreased  Handedness:Right  Mood and Affect  Mood:Anxious; Depressed  Affect:Congruent; Flat  Thought Process  Thought Processes:Coherent  Descriptions of Associations:Intact  Orientation:Full (Time, Place and  Person)  Thought Content:Illogical  History of Schizophrenia/Schizoaffective disorder:No  Duration of Psychotic Symptoms:No data recorded Hallucinations:Hallucinations: Auditory; Visual Description of Command Hallucinations: voices telling him to kill self Description of Visual Hallucinations: shadows  Ideas of Reference:Paranoia  Suicidal Thoughts:Suicidal Thoughts: Yes, Active SI Active Intent and/or Plan: Without Intent; Without Plan SI Passive Intent and/or Plan: Without Intent; Without Plan  Homicidal Thoughts:Homicidal Thoughts: No  Sensorium  Memory:Immediate Good  Judgment:Poor  Insight:Fair  Executive Functions  Concentration:Fair  Attention Span:Fair  [place]  Psychomotor Activity  Psychomotor Activity:Psychomotor Activity: Normal  Assets  Assets:Communication Skills  Sleep  Sleep:Sleep: Fair  Physical Exam: Physical Exam Constitutional:      Appearance: He is ill-appearing.  HENT:     Head: Normocephalic.     Nose: Nose normal. No congestion or rhinorrhea.  Eyes:     Pupils: Pupils are equal, round, and reactive to light.  Pulmonary:     Effort: Pulmonary effort is normal.  Neurological:     Mental Status: He is alert and oriented to person, place, and time.     Sensory: No sensory deficit.  Psychiatric:        Behavior: Behavior normal.   Review of Systems  Constitutional: Negative.  Negative for fever.  HENT: Negative.  Negative for sore throat.   Eyes: Negative.   Respiratory: Negative.  Negative for cough, shortness of breath and wheezing.   Cardiovascular: Negative.  Negative for chest pain.  Gastrointestinal: Negative.  Negative for heartburn and nausea.  Genitourinary: Negative.   Musculoskeletal: Negative.   Skin: Negative.   Neurological: Negative.  Negative for dizziness and headaches.  Endo/Heme/Allergies: Negative.   Psychiatric/Behavioral:  Positive for depression,  hallucinations, substance abuse and suicidal ideas. The patient is nervous/anxious and has insomnia.   Blood pressure 121/82, pulse 75, temperature 97.9 F (36.6 C), temperature source Oral, resp. rate 18, height '5\' 9"'$  (1.753 m), weight 85.7 kg, SpO2 100 %. Body mass index is 27.91 kg/m.  PLAN Safety and Monitoring: Voluntary admission to inpatient psychiatric unit for safety, stabilization and treatment Daily contact with patient to assess and evaluate symptoms and progress in treatment Patient's case to be discussed in multi-disciplinary team meeting Observation Level : q15 minute checks Vital signs: q12 hours Precautions: suicide, elopement, and assault   Long Term Goal(s): Improvement in symptoms so as ready for discharge   Short Term Goals: Ability to identify changes in lifestyle to reduce recurrence of condition will improve, Ability to verbalize feelings will improve, Ability to disclose and discuss suicidal ideas, Ability to demonstrate self-control will improve, Ability to identify and develop effective coping behaviors will improve, Compliance with prescribed medications will improve, and Ability to identify triggers associated with substance abuse/mental health issues will improve   Physician Treatment Plan for Secondary Diagnosis:  Principal Problem:   MDD (major depressive disorder), recurrent, severe, with psychosis ([place]) Active Problems:   MDD (major depressive disorder), recurrent episode, severe ([place])   Medications MDD recurrent severe with Psychotic Features -Discontinued Prozac on 3/3 -Continue Zoloft to 75 mg daily for depression  -Discontinue Seroquel to 200 mg due to ineffectiveness (02/21/22) -Start Zyprexa 2.5 mg in the mornings and 5 mg nightly for dpn & psychosis -Continue Remeron 7.5 mg nightly for depression, insomnia & appetite stimulation - Continue Melatonin '5mg'$  qhs  Anxiety -Continue Hydroxyzine 25 mg every 6 hours PRN   Medical Medications for CHF,  hyperlipidemia, Glaucoma, DM, HTN, OSA, h/o CVA  Continue Lasix 40 mg daily Continue Bidil 20-37.5 mg daily Continue Entresto 49-51 mg daily Continue Aldactone 12.5 mg daily  Continue Timolol eye drops daily Continue Lipitor 80 mg daily Continue Zetia 10 mg daily Continue ASA '81mg'$  daily   Hypokalemia on Lasix - resolved - K+ today 4.0  Hyponatremia (Na+ 134 on 02/17/22) - resolved - Monitor with titration up on SSRI - 137 (CMP from 3/6)  Abnormal EKG - Patient currently asymptomatic - repeating EKG today   Other PRNS -Continue Tylenol 650 mg every 6 hours PRN for mild pain -Continue Maalox 30 mg every 4 hrs PRN for indigestion -Continue Milk of Magnesia as needed every 6 hrs for constipation -Discontinued Trazodone '50mg'$  nightly (h/o CHF) -Continue Melatonin 5 mg nightly for insomnia   Discharge Planning: Social work and case management to assist with discharge planning and identification of hospital follow-up needs prior to discharge Estimated LOS: 5-7 days Discharge Concerns: Need to establish a safety plan; Medication compliance and effectiveness Discharge Goals: Return home with outpatient referrals for mental health follow-up including medication management/psychotherapy   Nicholes Rough, NP 02/21/2022, 2:31 PM Patient ID: Matthew Heman., male   DOB: Oct 19, 1966, 57 y.o.   MRN: [id number]

## 2022-02-21 NOTE — Group Note (Signed)
Recreation Therapy Group Note ? ? ?Group Topic:Stress Management  ?Group Date: 02/21/2022 ?Start Time: [phone] ?End Time: 3403 ?Facilitators: Victorino Sparrow, LRT,CTRS ?Location: [place] ? ? ?Goal Area(s) Addresses:  ?Patient will identify positive stress management techniques. ?Patient will identify benefits of using stress management post d/c. ? ?Group Description:  Meditation.  LRT played a meditation that focused on letting go of the past and not letting it inhibit progress moving forward.  It also focused on not dwelling on things that you can't change because they have already happened.  Patients were to listen to the meditation and follow along as it played to fully engage.  ? ? ?Affect/Mood: N/A ?  ?Participation Level: Did not attend ?  ? ?Clinical Observations/Individualized Feedback:   ? ? ?Plan: Continue to engage patient in RT group sessions 2-3x/week. ? ? ?Victorino Sparrow, LRT,CTRS ?02/21/2022 1:17 PM ?

## 2022-02-21 NOTE — BH IP Treatment Plan (Signed)
Interdisciplinary Treatment and Diagnostic Plan Update  02/21/2022 Time of Session: Did not attend- update [name] MRN: [id number]  Principal Diagnosis: MDD (major depressive disorder), recurrent, severe, with psychosis ([place])  Secondary Diagnoses: Principal Problem:   MDD (major depressive disorder), recurrent, severe, with psychosis ([place]) Active Problems:   MDD (major depressive disorder), recurrent episode, severe ([place])   Current Medications:  Current Facility-Administered Medications  Medication Dose Route Frequency Provider Last Rate Last Admin   acetaminophen (TYLENOL) tablet 650 mg  650 mg Oral Q6H PRN Lucky Rathke, FNP   650 mg at 02/20/22 1721   alum & mag hydroxide-simeth (MAALOX/MYLANTA) 200-200-20 MG/5ML suspension 30 mL  30 mL Oral Q4H PRN Lucky Rathke, FNP       aspirin EC tablet 81 mg  81 mg Oral Daily Lucky Rathke, FNP   81 mg at 02/21/22 0811   atorvastatin (LIPITOR) tablet 80 mg  80 mg Oral Daily Lucky Rathke, FNP   80 mg at 02/21/22 0810   ezetimibe (ZETIA) tablet 10 mg  10 mg Oral Daily Lucky Rathke, FNP   10 mg at 02/21/22 4696   furosemide (LASIX) tablet 40 mg  40 mg Oral Daily Lucky Rathke, FNP   40 mg at 02/21/22 2952   hydrocerin (EUCERIN) cream   Topical BID Nicholes Rough, NP       hydrOXYzine (ATARAX) tablet 25 mg  25 mg Oral TID PRN Lucky Rathke, FNP   25 mg at 02/20/22 1720   ibuprofen (ADVIL) tablet 400 mg  400 mg Oral Q6H PRN Nicholes Rough, NP   400 mg at 02/20/22 2126   isosorbide-hydrALAZINE (BIDIL) 20-37.5 MG per tablet 1 tablet  1 tablet Oral TID Minda Ditto, RPH   1 tablet at 02/21/22 1215   magnesium hydroxide (MILK OF MAGNESIA) suspension 30 mL  30 mL Oral Daily PRN Lucky Rathke, FNP       melatonin tablet 5 mg  5 mg Oral QHS Nkwenti, Doris, NP   5 mg at 02/20/22 2126   OLANZapine (ZYPREXA) tablet 2.5 mg  2.5 mg Oral Daily Nkwenti, Tamela Oddi, NP       OLANZapine (ZYPREXA) tablet 5 mg  5 mg Oral QHS Nkwenti, Doris, NP        sacubitril-valsartan (ENTRESTO) 49-51 mg per tablet  1 tablet Oral BID Lucky Rathke, FNP   1 tablet at 02/21/22 0811   [START ON 02/22/2022] sertraline (ZOLOFT) tablet 75 mg  75 mg Oral Daily Nkwenti, Doris, NP       spironolactone (ALDACTONE) tablet 12.5 mg  12.5 mg Oral Daily Beatriz Stallion L, FNP   12.5 mg at 02/21/22 0811   timolol (TIMOPTIC) 0.5 % ophthalmic solution 1 drop  1 drop Both Eyes QPC breakfast Lucky Rathke, FNP   1 drop at 02/21/22 8413   PTA Medications: Medications Prior to Admission  Medication Sig Dispense Refill Last Dose   aspirin EC 81 MG tablet Take 1 tablet (81 mg total) by mouth daily. Swallow whole. (Patient not taking: Reported on 11/17/2021) 30 tablet 11    atorvastatin (LIPITOR) 80 MG tablet TAKE 1 TABLET BY MOUTH DAILY. 30 tablet 3    ezetimibe (ZETIA) 10 MG tablet Take 1 tablet (10 mg total) by mouth daily. 30 tablet 3    FLUoxetine (PROZAC) 40 MG capsule Take 1 capsule (40 mg total) by mouth daily. 30 capsule 5    furosemide (LASIX) 40 MG tablet Take 1  tablet (40 mg total) by mouth daily. 30 tablet 3    hydrOXYzine (ATARAX) 25 MG tablet Take 1 tablet (25 mg total) by mouth 3 (three) times daily as needed for anxiety. 90 tablet 2    isosorbide-hydrALAZINE (BIDIL) 20-37.5 MG tablet Take 1 tablet by mouth 3 (three) times daily. 90 tablet 11    sacubitril-valsartan (ENTRESTO) 49-51 MG Take 1 tablet by mouth 2 (two) times daily. 60 tablet 1    spironolactone (ALDACTONE) 25 MG tablet Take 0.5 tablets (12.5 mg total) by mouth daily. 45 tablet 3    timolol (TIMOPTIC) 0.5 % ophthalmic solution Place 1 drop into both eyes daily after breakfast.       Patient Stressors:    Patient Strengths:    Treatment Modalities: Medication Management, Group therapy, Case management,  1 to 1 session with clinician, Psychoeducation, Recreational therapy.   Physician Treatment Plan for Primary Diagnosis: MDD (major depressive disorder), recurrent, severe, with psychosis ([place]) Long  Term Goal(s): Improvement in symptoms so as ready for discharge   Short Term Goals: Ability to identify changes in lifestyle to reduce recurrence of condition will improve Ability to verbalize feelings will improve Ability to disclose and discuss suicidal ideas Ability to demonstrate self-control will improve Ability to identify and develop effective coping behaviors will improve Compliance with prescribed medications will improve Ability to identify triggers associated with substance abuse/mental health issues will improve  Medication Management: Evaluate patient's response, side effects, and tolerance of medication regimen.  Therapeutic Interventions: 1 to 1 sessions, Unit Group sessions and Medication administration.  Evaluation of Outcomes: Not Progressing  Physician Treatment Plan for Secondary Diagnosis: Principal Problem:   MDD (major depressive disorder), recurrent, severe, with psychosis ([place]) Active Problems:   MDD (major depressive disorder), recurrent episode, severe ([place])  Long Term Goal(s): Improvement in symptoms so as ready for discharge   Short Term Goals: Ability to identify changes in lifestyle to reduce recurrence of condition will improve Ability to verbalize feelings will improve Ability to disclose and discuss suicidal ideas Ability to demonstrate self-control will improve Ability to identify and develop effective coping behaviors will improve Compliance with prescribed medications will improve Ability to identify triggers associated with substance abuse/mental health issues will improve     Medication Management: Evaluate patient's response, side effects, and tolerance of medication regimen.  Therapeutic Interventions: 1 to 1 sessions, Unit Group sessions and Medication administration.  Evaluation of Outcomes: Progressing   RN Treatment Plan for Primary Diagnosis: MDD (major depressive disorder), recurrent, severe, with psychosis ([place]) Long Term Goal(s):  Knowledge of disease and therapeutic regimen to maintain health will improve  Short Term Goals: Ability to remain free from injury will improve, Ability to verbalize frustration and anger appropriately will improve, Ability to demonstrate self-control, Ability to participate in decision making will improve, Ability to verbalize feelings will improve, Ability to disclose and discuss suicidal ideas, Ability to identify and develop effective coping behaviors will improve, and Compliance with prescribed medications will improve  Medication Management: RN will administer medications as ordered by provider, will assess and evaluate patient's response and provide education to patient for prescribed medication. RN will report any adverse and/or side effects to prescribing provider.  Therapeutic Interventions: 1 on 1 counseling sessions, Psychoeducation, Medication administration, Evaluate responses to treatment, Monitor vital signs and CBGs as ordered, Perform/monitor CIWA, COWS, AIMS and Fall Risk screenings as ordered, Perform wound care treatments as ordered.  Evaluation of Outcomes: Progressing   LCSW Treatment Plan for Primary Diagnosis: MDD (  major depressive disorder), recurrent, severe, with psychosis ([place]) Long Term Goal(s): Safe transition to appropriate next level of care at discharge, Engage patient in therapeutic group addressing interpersonal concerns.  Short Term Goals: Engage patient in aftercare planning with referrals and resources, Increase social support, Increase ability to appropriately verbalize feelings, Increase emotional regulation, Facilitate acceptance of mental health diagnosis and concerns, Facilitate patient progression through stages of change regarding substance use diagnoses and concerns, Identify triggers associated with mental health/substance abuse issues, and Increase skills for wellness and recovery  Therapeutic Interventions: Assess for all discharge needs, 1 to 1 time  with Social worker, Explore available resources and support systems, Assess for adequacy in community support network, Educate family and significant other(s) on suicide prevention, Complete Psychosocial Assessment, Interpersonal group therapy.  Evaluation of Outcomes: Not Progressing   Progress in Treatment: Attending groups: Yes. Participating in groups: Yes. Taking medication as prescribed: Yes. Toleration medication: Yes. Family/Significant other contact made: Yes, individual(s) contacted:  Mother in Law Patient understands diagnosis: Yes. Discussing patient identified problems/goals with staff: Yes. Medical problems stabilized or resolved: Yes. Denies suicidal/homicidal ideation: Yes. Issues/concerns per patient self-inventory: Yes. Other: none  New problem(s) identified: No, Describe:  no new problems identified   New Short Term/Long Term Goal(s):   medication stabilization, elimination of SI thoughts, development of comprehensive mental wellness plan.    Patient Goals:  "get better"  Discharge Plan or Barriers: Patient unable to identify a place to return upon discharge.  Patient provided list of shelter options but has not yet made phone calls to find an appropriate place, comorbidity and ongoing depression  Reason for Continuation of Hospitalization: Anxiety Depression Medication stabilization Suicidal ideation  Estimated Length of Stay: 3-5 days   Scribe for Treatment Team: Zachery Conch, LCSW 02/21/2022 3:15 PM

## 2022-02-21 NOTE — [facility] Group Notes (Signed)
Adult Psychoeducational Group Note ? ?Date:  02/21/2022 ?Time:  11:57 AM ? ?Group Topic/Focus:  ?Goals Group:   The focus of this group is to help patients establish daily goals to achieve during treatment and discuss how the patient can incorporate goal setting into their daily lives to aide in recovery. ? ?Participation Level:  Active ? ?Participation Quality:  Appropriate ? ?Affect:  Appropriate ? ?Cognitive:  Appropriate ? ?Insight: Appropriate ? ?Engagement in Group:  Engaged ? ?Modes of Intervention:  Discussion ? ?Additional Comments: Patient attended morning orientation group and participated.  ? ?Ceniyah Thorp W Molinda Bailiff ?9/0/1222, 11:57 AM ?

## 2022-02-21 NOTE — Group Note (Signed)
LCSW Aftercare Discharge Planning Group Note ? ? ?Type of Group and Topic: Psychoeducational Group: Discharge Planning ? ?Participation Level: Active ? ?Description of Group ? ?Discharge planning group reviews patient's anticipated discharge plans and assists patients to anticipate and address any barriers to wellness/recovery in the community. Suicide prevention education is reviewed with patients in group. ? ?Therapeutic Goals ? ?1. Patients will state their anticipated discharge plan and mental health aftercare ? ?2. Patients will identify potential barriers to wellness in the community setting ? ?3. Patients will engage in problem solving, solution focused discussion of ways to anticipate and address barriers to wellness/recovery ? ? ?Plan for Discharge/Comments: Patient participated appropriately in group.  Patient identified that he would like to change his thoughts to be more positive.  Patient was open to feedback from peers.  ? ? ?Matthew Leib, LCSW, LCAS ?Clincal Social Worker  ?[facility] ? ? ? ? ?

## 2022-02-22 ENCOUNTER — Telehealth (HOSPITAL_COMMUNITY): Payer: Self-pay | Admitting: Family Medicine

## 2022-02-22 LAB — GLUCOSE, CAPILLARY: Glucose-Capillary: 104 mg/dL — ABNORMAL HIGH (ref 70–99)

## 2022-02-22 MED ORDER — GABAPENTIN 100 MG PO CAPS
100.0000 mg | ORAL_CAPSULE | Freq: Three times a day (TID) | ORAL | Status: DC
  Administered 2022-02-22 – 2022-02-26 (×12): 100 mg via ORAL
  Filled 2022-02-22 (×5): qty 1
  Filled 2022-02-22: qty 21
  Filled 2022-02-22 (×6): qty 1
  Filled 2022-02-22: qty 21
  Filled 2022-02-22: qty 1
  Filled 2022-02-22: qty 21
  Filled 2022-02-22 (×5): qty 1

## 2022-02-22 MED ORDER — OLANZAPINE 7.5 MG PO TABS
7.5000 mg | ORAL_TABLET | Freq: Every day | ORAL | Status: DC
  Administered 2022-02-22 – 2022-02-25 (×4): 7.5 mg via ORAL
  Filled 2022-02-22 (×6): qty 1
  Filled 2022-02-22: qty 7
  Filled 2022-02-22: qty 1

## 2022-02-22 NOTE — Plan of Care (Signed)
Nurse discussed coping skills with patient.  

## 2022-02-22 NOTE — Progress Notes (Signed)
D:  Patient's self inventory sheet, patient has poor sleep, sleep medication not helpful. Fair appetite, low energy level, poor concentration.  Rated depression 9,, hopeless 5, anxiety 6.  Denied withdrawals.  SI, contracts for safety, no plan.  Physical problems, lightheaded, headaches, blurred vision.  Has toe pain, worst pain #9 in past 24 hours.  Pain medicine not helpful.  Goal is positive thoughts.  Plans to concentrate.  "I need help finding housing."  No discharge plans. ?A:  Medications administered per MD orders.  Emotional support  and encouragement given patient. ?R:  Safety maintained with 15 minute checks.  SI, contracts for safety, no plan.  Denied HI.  Does see shadows, hears voices to kill himself. ? ?

## 2022-02-22 NOTE — [facility] Group Notes (Signed)
PsychoEducational Group Note- ?The patients were educated on identifying negative behavioral patterns, and ways that negative patterns can impact mental health. A poem was read by Cristopher Peru, there's a hole in my sidewalk'' in regards to negative patterns. The patients were then asked to participate in sharing and identifying their own negative patterns. A second poem was shared by Donzetta Kohut ''the owl and the chimpanzee'' in which patients are asked to recognize fight or flight patterns with their own anxiety. The patient attended and was appropriate. ?

## 2022-02-22 NOTE — Progress Notes (Signed)
?   02/21/22 2000  ?Psych Admission Type (Psych Patients Only)  ?Admission Status Voluntary  ?Psychosocial Assessment  ?Patient Complaints Depression;Anxiety  ?Eye Contact Fair  ?Facial Expression Anxious  ?Affect Depressed  ?Speech Slow  ?Interaction Assertive  ?Motor Activity Slow  ?Appearance/Hygiene Improved  ?Behavior Characteristics Cooperative  ?Mood Pleasant  ?Thought Process  ?Coherency WDL  ?Content WDL  ?Delusions None reported or observed  ?Perception Hallucinations  ?Hallucination Auditory  ?Judgment Impaired  ?Confusion None  ?Danger to Self  ?Current suicidal ideation? Denies  ?Self-Injurious Behavior No self-injurious ideation or behavior indicators observed or expressed   ?Agreement Not to Harm Self Yes  ?Description of Agreement verbal  ?Danger to Others  ?Danger to Others None reported or observed  ? ? ?

## 2022-02-22 NOTE — Progress Notes (Addendum)
[facility] MD Progress Note  02/22/2022 1:37 PM Matthew [name].  MRN:  [id number]  Subjective:  Matthew [name] reports: "I am still hearing the voices and I am still seeing the shadows. Nothing has changed since I got here."  Today's assessment (02/22/2022): Pt's chart reviewed, case discussed with the treatment team. Pt continues to present with a flat affect and depressed mood, attention to personal hygiene and grooming is fair, eye contact is good, speech is clear & coherent. Thought contents are organized and logical. Pt endorses +SI today, denies having a plan, verbally contracts for safety on the unit, denies HI, endorses +AH, states voices are commanding in nature and telling him to kill himself. He reports +VH of shadows which he last saw last night in his room. He also presents with paranoia, states that he feels as though people are out to get him. He rates depression 8 (10 being worst), and rates anxiety 8 (10 being worst).   Pt reports a poor sleep quality last night, states that he was unable to stay asleep, and reports his appetite as being fair. He reports that he is tolerating his medications well. Pt reported feeling lightheaded and dizzy earlier this morning, but now reports that the feeling has resolved. He is also continuing to report insomnia, even though he is taking Melatonin 5 mg nightly. Trazodone was discontinued due CHF. There is concerns for respiratory depression with an addition of any other medication for insomnia, since pt has a history of sleep apnea. Seroquel was discontinued due to ineffectiveness in managing pt's psychosis, and start Zyprexa 5 mg nightly and 2.5 mg in the mornings for management of depression, anxiety and psychosis was added. Pt is also on a low dose of Remeron 7.5 mg nightly to help with insomnia, appetite & depression. Na is WNL at 137 from CMP completed on 3/6. Medicine was consulted today due to concerns of lightheadedness & dizziness in the setting of elevated Bps  and spoke with Dr. Tennis [name] who recommended watching for now, ordered vital signs Q 4 H with the exception of during sleep times, and call back if symptoms return/worsen. Discharge planning is ongoing with social work, and pt has been encouraged by this provider to call from the housing resource given to him. He is showing no motivation to call, and there are concerns for secondary gains with reports of no improvement in his depressive symptoms & psychosis since admission.   Neurontin 100 mg TID ordered for complaints of left foot pain & anxiety. Zyprexa increased to 7.5 mg nightly for psychosis & depression. Q4H vital signs entered during awake hours. Order placed for room to be locked during activities and groups, so pt can participate.  Labs reviewed: CR is 1.37 on 3/1, repeated and WNL at 1.17. CBC is WNL, Prolactin, lipid panel, TSH completed and WNL. Hemoglobin A1C last drawn on 12/05/21 and was 5.8 as per chart review. EKG from 3/1 with QTC-466 & T wave abnormality. Repeat EKG on 3/4 shows New T wave change in V6 and QTC 428m - patient asymptomatic. Baseline UA completed 3/1, color was straw, but results otherwise WNL.   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   Principal Problem: MDD (major depressive disorder), recurrent, severe, with psychosis ([place]) Diagnosis: Principal Problem:   MDD (major depressive disorder), recurrent, severe, with psychosis ([place]) Active Problems:   MDD (major depressive disorder), recurrent episode, severe ([place])  Total Time spent with patient: 20 minutes  Past Psychiatric History: Anxiety & MDD  Past Medical History:  Past  Medical History:  Diagnosis Date   Acute combined systolic and diastolic CHF, NYHA class 3 ([place])    a. 02/2014 Echo: EF 25-30%.   Cardiomyopathy ([place])    a. 02/2014 Echo: EF 25-30%, sev glob HK with inferolat HK->AK, mod conc LVH, Gr 2 DD, Mild MR, sev dil LA.   Depression    DM (diabetes mellitus) ([place])    High cholesterol    Hypertension    Morbid obesity ([employer])    Tobacco abuse     Past Surgical History:  Procedure Laterality Date   CATARACT EXTRACTION Right    LEFT HEART CATHETERIZATION WITH CORONARY ANGIOGRAM N/A 03/04/2014   Procedure: LEFT HEART CATHETERIZATION WITH CORONARY ANGIOGRAM;  Surgeon: Burnell Blanks, MD;  Location[facility] C[facility] LAB;  Service: Cardiovascular;  Laterality: N/A;   TEE WITHOUT CARDIOVERSION N/A 02/04/2017   Procedure: TRANSESOPHAGEAL ECHOCARDIOGRAM (TEE);  Surgeon: Dorothy Spark, MD;  Location: [facility] ENDOSCOPY;  Service: Cardiovascular;  Laterality: N/A;   Family History:  Family History  Problem Relation Age of Onset   Heart attack Father        died @ 23   Cervical cancer Mother        died @ 2   Family Psychiatric  History: none reported Social History:  Social History   Substance and Sexual Activity  Alcohol Use No     Social History   Substance and Sexual Activity  Drug Use No    Social History   Socioeconomic History   Marital status: Widowed    Spouse name: Not on file   Number of children: 2   Years of education: 14   Highest education level: Not on file  Occupational History   Occupation: [employer]  Tobacco Use   Smoking status: Former    Years: 20.00    Types: Cigarettes   Smokeless tobacco: Former    Quit date: 11/14/2020  Vaping Use   Vaping Use: Never used  Substance and Sexual Activity   Alcohol use: No   Drug use: No   Sexual activity: Yes  Other Topics Concern   Not on file  Social History Narrative   Lives with 2 young children in [place].  Wife died in 05/21/20.  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  Does  not routinely exercise.   Left-handed   Caffeine: occasional tea   Social Determinants of Radio broadcast assistant Strain: Not on file  Food Insecurity: Not on file  Transportation Needs: Not on file  Physical Activity: Not on file  Stress: Not on file  Social Connections: Not on file   Additional Social History:     Sleep: Poor  Appetite:  Good  Current Medications: Current Facility-Administered Medications  Medication Dose Route Frequency Provider Last Rate Last Admin   acetaminophen (TYLENOL) tablet 650 mg  650 mg Oral Q6H PRN Lucky Rathke, FNP   650 mg at 02/21/22 1846   alum & mag hydroxide-simeth (MAALOX/MYLANTA) 200-200-20 MG/5ML suspension 30 mL  30 mL Oral Q4H PRN Lucky Rathke, FNP       aspirin  EC tablet 81 mg  81 mg Oral Daily Lucky Rathke, FNP   81 mg at 02/22/22 0829   atorvastatin (LIPITOR) tablet 80 mg  80 mg Oral Daily Lucky Rathke, FNP   80 mg at 02/22/22 0827   ezetimibe (ZETIA) tablet 10 mg  10 mg Oral Daily Lucky Rathke, FNP   10 mg at 02/22/22 2376   furosemide (LASIX) tablet 40 mg  40 mg Oral Daily Lucky Rathke, FNP   40 mg at 02/22/22 2831   hydrocerin (EUCERIN) cream   Topical BID Nicholes Rough, NP   1 application. at 02/22/22 0830   hydrOXYzine (ATARAX) tablet 25 mg  25 mg Oral TID PRN Lucky Rathke, FNP   25 mg at 02/22/22 0827   ibuprofen (ADVIL) tablet 400 mg  400 mg Oral Q6H PRN Nicholes Rough, NP   400 mg at 02/20/22 2126   isosorbide-hydrALAZINE (BIDIL) 20-37.5 MG per tablet 1 tablet  1 tablet Oral TID Minda Ditto, RPH   1 tablet at 02/22/22 1257   magnesium hydroxide (MILK OF MAGNESIA) suspension 30 mL  30 mL Oral Daily PRN Lucky Rathke, FNP       melatonin tablet 5 mg  5 mg Oral QHS Itzamara Casas, NP   5 mg at 02/21/22 2130   OLANZapine (ZYPREXA) tablet 2.5 mg  2.5 mg Oral Daily Quinlan Vollmer, Tamela Oddi, NP   2.5 mg at 02/22/22 0900   OLANZapine (ZYPREXA) tablet 5 mg  5 mg Oral QHS Julius Boniface, NP   5 mg at 02/21/22 2130    sacubitril-valsartan (ENTRESTO) 49-51 mg per tablet  1 tablet Oral BID Lucky Rathke, FNP   1 tablet at 02/22/22 0827   sertraline (ZOLOFT) tablet 75 mg  75 mg Oral Daily Nicholes Rough, NP   75 mg at 02/22/22 5176   spironolactone (ALDACTONE) tablet 12.5 mg  12.5 mg Oral Daily Lucky Rathke, FNP   12.5 mg at 02/22/22 0829   timolol (TIMOPTIC) 0.5 % ophthalmic solution 1 drop  1 drop Both Eyes QPC breakfast Lucky Rathke, FNP   1 drop at 02/22/22 1607   Lab Results:  No results found for this or any previous visit (from the past 48 hour(s)).  Blood Alcohol level:  Lab Results  Component Value Date   ETH <10 37/09/6268   Metabolic Disorder Labs: Lab Results  Component Value Date   HGBA1C 5.8 12/05/2021   MPG 111.15 12/27/2020   MPG 142.72 07/04/2020   Lab Results  Component Value Date   PROLACTIN 4.8 02/14/2022   Lab Results  Component Value Date   CHOL 166 02/14/2022   TRIG 57 02/14/2022   HDL 54 02/14/2022   CHOLHDL 3.1 02/14/2022   VLDL 11 02/14/2022   LDLCALC 101 (H) 02/14/2022   LDLCALC 40 09/15/2021   Physical Findings: AIMS: Facial and Oral Movements Muscles of Facial Expression: None, normal Lips and Perioral Area: None, normal Jaw: None, normal Tongue: None, normal,Extremity Movements Upper (arms, wrists, hands, fingers): None, normal Lower (legs, knees, ankles, toes): None, normal, Trunk Movements Neck, shoulders, hips: None, normal, Overall Severity Severity of abnormal movements (highest score from questions above): None, normal Incapacitation due to abnormal movements: None, normal Patient's awareness of abnormal movements (rate only patient's report): No Awareness, Dental Status Current problems with teeth and/or dentures?: No Does patient usually wear dentures?: No  CIWA:  n/a COWS:  n/a AIMS: 0  Musculoskeletal: Strength & Muscle Tone: decreased Gait & Station:  unsteady Patient leans: N/A  Psychiatric Specialty Exam:  Presentation  General  Appearance: Appropriate for Environment  Eye Contact:Good  Speech:Clear and Coherent  Speech Volume:Normal  Handedness:Right  Mood and Affect  Mood:Euthymic  Affect:Appropriate  Thought Process  Thought Processes:Coherent  Descriptions of Associations:Intact  Orientation:Full (Time, Place and Person)  Thought Content:Logical  History of Schizophrenia/Schizoaffective disorder:No  Duration of Psychotic Symptoms:No data recorded Hallucinations:Hallucinations: Auditory; Command Description of Command Hallucinations: voices telling him to kill himself Description of Visual Hallucinations: seeing shadows  Ideas of Reference:Paranoia  Suicidal Thoughts:Suicidal Thoughts: Yes, Active SI Active Intent and/or Plan: Without Intent; Without Plan SI Passive Intent and/or Plan: Without Intent; Without Plan  Homicidal Thoughts:Homicidal Thoughts: No  Sensorium  Memory:Immediate Good; Recent Good  Judgment:Poor  Insight:Poor  Executive Functions  Concentration:Fair  Attention Span:Fair  [place]  Psychomotor Activity  Psychomotor Activity:Psychomotor Activity: Decreased; Psychomotor Retardation  Assets  Assets:Communication Skills  Sleep  Sleep:Sleep: Fair  Physical Exam: Physical Exam Constitutional:      Appearance: He is ill-appearing.  HENT:     Head: Normocephalic.     Nose: Nose normal. No congestion or rhinorrhea.  Eyes:     Pupils: Pupils are equal, round, and reactive to light.  Pulmonary:     Effort: Pulmonary effort is normal.  Neurological:     Mental Status: He is alert and oriented to person, place, and time.     Sensory: No sensory deficit.  Psychiatric:        Behavior: Behavior normal.   Review of Systems  Constitutional: Negative.  Negative for fever.  HENT: Negative.  Negative for sore throat.   Eyes: Negative.   Respiratory: Negative.  Negative for cough, shortness of breath and  wheezing.   Cardiovascular: Negative.  Negative for chest pain.  Gastrointestinal: Negative.  Negative for heartburn and nausea.  Genitourinary: Negative.   Musculoskeletal: Negative.   Skin: Negative.   Neurological: Negative.  Negative for dizziness and headaches.  Endo/Heme/Allergies: Negative.   Psychiatric/Behavioral:  Positive for depression, hallucinations, substance abuse and suicidal ideas. The patient is nervous/anxious and has insomnia.   Blood pressure 120/82, pulse 71, temperature 97.8 F (36.6 C), temperature source Oral, resp. rate 18, height '5\' 9"'$  (1.753 m), weight 85.7 kg, SpO2 100 %. Body mass index is 27.91 kg/m.  PLAN Safety and Monitoring: Voluntary admission to inpatient psychiatric unit for safety, stabilization and treatment Daily contact with patient to assess and evaluate symptoms and progress in treatment Patient's case to be discussed in multi-disciplinary team meeting Observation Level : q15 minute checks Vital signs: q12 hours Precautions: suicide, elopement, and assault   Long Term Goal(s): Improvement in symptoms so as ready for discharge   Short Term Goals: Ability to identify changes in lifestyle to reduce recurrence of condition will improve, Ability to verbalize feelings will improve, Ability to disclose and discuss suicidal ideas, Ability to demonstrate self-control will improve, Ability to identify and develop effective coping behaviors will improve, Compliance with prescribed medications will improve, and Ability to identify triggers associated with substance abuse/mental health issues will improve   Physician Treatment Plan for Secondary Diagnosis:  Principal Problem:   MDD (major depressive disorder), recurrent, severe, with psychosis ([place]) Active Problems:   MDD (major depressive disorder), recurrent episode, severe ([place])   Medications MDD recurrent severe with Psychotic Features -Discontinued Prozac on 3/3 -Continue Zoloft to 75 mg daily  for depression  -Discontinue Seroquel to 200 mg due to ineffectiveness (02/21/22) -Continue Zyprexa  2.5 mg in the mornings and 5 mg nightly for dpn & psychosis -Continue Remeron 7.5 mg nightly for depression, insomnia & appetite stimulation - Continue Melatonin '5mg'$  qhs  Anxiety -Continue Hydroxyzine 25 mg every 6 hours PRN   Medical Medications for CHF, hyperlipidemia, Glaucoma, DM, HTN, OSA, h/o CVA Continue Lasix 40 mg daily Continue Bidil 20-37.5 mg daily Continue Entresto 49-51 mg daily Continue Aldactone 12.5 mg daily  Continue Timolol eye drops daily Continue Lipitor 80 mg daily Continue Zetia 10 mg daily Continue ASA '81mg'$  daily   Hypokalemia on Lasix - resolved - K+ today 4.0  Hyponatremia (Na+ 134 on 02/17/22) - resolved - Monitor with titration up on SSRI - 137 (CMP from 3/6)  Abnormal EKG - Patient currently asymptomatic - EKG on 3/4 with results: Normal sinus rhythm Moderate voltage criteria for LVH, may be normal variant ,T wave abnormality, consider lateral ischemia. Repeated on 3/6 with same results.   Other PRNS -Continue Tylenol 650 mg every 6 hours PRN for mild pain -Continue Maalox 30 mg every 4 hrs PRN for indigestion -Continue Milk of Magnesia as needed every 6 hrs for constipation -Discontinued Trazodone '50mg'$  nightly (h/o CHF) -Continue Melatonin 5 mg nightly for insomnia   Discharge Planning: Social work and case management to assist with discharge planning and identification of hospital follow-up needs prior to discharge Estimated LOS: 5-7 days Discharge Concerns: Need to establish a safety plan; Medication compliance and effectiveness Discharge Goals: Return home with outpatient referrals for mental health follow-up including medication management/psychotherapy   Nicholes Rough, NP 02/22/2022, 1:37 PM Patient ID: Matthew [name]., male   DOB: September 02, 1966, 56 y.o.   MRN: [id number]

## 2022-02-22 NOTE — BHH Group Notes (Signed)
[facility] Group Notes:  (Nursing/MHT/Case Management/Adjunct) ? ?Date:  02/22/2022  ?Time:  8:48 PM ? ?Type of Therapy:  Group Therapy ? ?Participation Level:  Active ? ?Participation Quality:  Supportive ? ?Affect:  Appropriate ? ?Cognitive:  Appropriate ? ?Insight:  Good ? ?Engagement in Group:  Engaged ? ?Modes of Intervention:  Discussion ? ?Summary of Progress/Problems: Goal for the day was to stay positive ? ?Matthew [name] ?02/22/2022, 8:48 PM ?

## 2022-02-22 NOTE — BHH Counselor (Signed)
CSW spoke with patient regarding discharge planning. CSW provided number from caseworker at family services for CSW to call.  Patient reports that she put in an application with [facility] for housing. Patient provided permission for social worker to call family services caseworker.  Contact information below:  ? ?Sheryl Shepherd ?[phone] ? ? ?Patient also provided consent again to call mother in law.  ? ? ?CSW called family service caseworker.  They reported that they put in an application for [facility] for housing.  Caseworker reports that usually those applications can take multiple weeks.  CSW informed caseworker that at this time the shelter is the only option for housing since he doesn't have any income.  Caseworker understanding.  Reports that she will reach out with any updates to help coordinate care.  ? ? ?Alistar Mcenery, LCSW, LCAS ?Clincal Social Worker  ?[facility] ? ?

## 2022-02-22 NOTE — BH Assessment (Signed)
Care Management - Follow Up [facility] Discharges  ? ?Patient has been placed in an inpatient psychiatric hospital ([facility]) on 02-15-2022. ?

## 2022-02-23 LAB — GLUCOSE, CAPILLARY: Glucose-Capillary: 91 mg/dL (ref 70–99)

## 2022-02-23 NOTE — Group Note (Signed)
Date:  02/23/2022 ?Time:  11:36 AM ? ?Group Topic/Focus:  ?Dimensions of Wellness:   The focus of this group is to introduce the topic of wellness and discuss the role each dimension of wellness plays in total health. ? ? ? ?Participation Level:  Active ? ?Participation Quality:  Appropriate ? ?Affect:  Appropriate ? ?Cognitive:  Appropriate ? ?Insight: Appropriate ? ?Engagement in Group:  Engaged ? ?Modes of Intervention:  Discussion ? ?Additional Comments:  recognizes the importance of physical exercise as a part of wellness and wellbeing. ? ?Matthew [name] ?02/23/2022, 11:36 AM ? ?

## 2022-02-23 NOTE — Progress Notes (Signed)
Alert, verbal and able to make needs known. Patient is ambulating around unit and denies SI/HI at this time. He admits to continuing to see shadows and hearing voices talking to him. He contracts for safety and denies thoughts of self harm at this present time. Patient taking medications appropriately. Coping skills encouraged.  ?

## 2022-02-23 NOTE — BHH Counselor (Signed)
CSW met with patient and discussed discharge planning.  Patient states that he is getting better but does not feel completely safe. CSW provided information to patient regarding housing application that his caseworker at [facility] is working on.  Patient states that he does not want to leave [place].  He understands that he will be discharged to [facility] upon discharge until he can get updates about the housing application.  ? ? ?Daundre Biel, LCSW, LCAS ?Clincal Social Worker  ?[facility] ? ?

## 2022-02-23 NOTE — Group Note (Signed)
Type of Therapy and Topic:  Group Therapy:  Self-Esteem ?  ?Participation Level:  Active ? ?Description of Group: ?This group addressed positive self-esteem. Patients were given a worksheet with a blank shield. Patients were asked what a shield is and when it is used. Patients were asked to list, draw, or write protective factors in the their lives on their shields. Patients discussed the words, ideas and drawings that they put on their shield. Patients were encouraged to have a daily reflection of positive characteristics/ protective factors. ? ?Therapeutic Goals ?Patient will verbalize two of their positive qualities ?Patient will demonstrate insight but naming social supports in their lives ?Patient will verbalize their feelings when voicing positive self affirmations and when voicing positive affirmations of others ?Patients will discuss the potential positive impact on their wellness/recovery of focusing on positive traits of self and others. ? ?Summary of Patient Progress: Patient actively and appropriately participated in group.  Patient reports that a support in his life is the staff here.  Patient participated in activity to identify protective factors in his life.  Patient was open and provided feedback to peers regarding group topic.  ? ? ?Pedro Oldenburg, LCSW, LCAS ?Clincal Social Worker  ?[facility] ? ? ?

## 2022-02-23 NOTE — Progress Notes (Signed)
[facility] MD Progress Note  02/23/2022 7:51 PM Matthew [name].  MRN:  [id number]  Subjective:  "I am better than what it was yesterday, I do not have a headache or blurry vision."  Daily Notes 02/23/22: Patient is examined in his room sitting up in the bed. Chart reviewed, and findings shared with the treatment Team and discussed with Dr. Berdine Addison. Pt continues to present with a flat affect and depressed mood, attention to personal hygiene and grooming is fair, eye contact is good, speech is clear & coherent. Thought contents are organized and logical. Patient denied SI/HI.  He endorsed AVH, stating he sees a figure standing behind and in front of him laughing and telling him to kill himself. Able to contract for safety on the unit. He also presents with paranoia, states that he feels as though people are out to get him. He rated depression as "10" and anxiety as "8" on a scale of 0 to 10.  Patient reported sleeping for at least 7 hours last night and and comsuming 100% of his meals today.Tolerating his medications well without any side effects.  Participating effectively on therapeutic milieu and group activities. Last labs drawn on 02/19/22. No change in routine labs. No change in medication therapy today.  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    Principal Problem: MDD (major depressive disorder), recurrent, severe, with psychosis ([place])  Diagnosis: Principal Problem:   MDD (major depressive disorder), recurrent, severe, with psychosis ([place]) Active Problems:   MDD (major depressive disorder), recurrent episode, severe ([place])  Total Time spent with  patient: 20 minutes  Past Psychiatric History: Anxiety and MDD  Past Medical History:  Past Medical History:  Diagnosis Date   Acute combined systolic and diastolic CHF, NYHA class 3 ([place])    a. 02/2014 Echo: EF 25-30%.   Cardiomyopathy ([place])    a. 02/2014 Echo: EF 25-30%, sev glob HK with inferolat HK->AK, mod conc LVH, Gr 2 DD, Mild MR, sev dil LA.   Depression    DM (diabetes mellitus) ([place])    High cholesterol    Hypertension    Morbid obesity ([place])    Tobacco abuse     Past Surgical History:  Procedure Laterality Date   CATARACT EXTRACTION Right    LEFT HEART CATHETERIZATION WITH CORONARY ANGIOGRAM N/A 03/04/2014   Procedure: LEFT HEART CATHETERIZATION WITH CORONARY ANGIOGRAM;  Surgeon: Burnell Blanks, MD;  Location: [facility] CATH LAB;  Service: Cardiovascular;  Laterality: N/A;   TEE WITHOUT CARDIOVERSION N/A 02/04/2017   Procedure: TRANSESOPHAGEAL ECHOCARDIOGRAM (TEE);  Surgeon: Dorothy Spark, MD;  Location: [facility] ENDOSCOPY;  Service: Cardiovascular;  Laterality: N/A;   Family History:  Family History  Problem Relation Age of Onset   Heart attack Father        died @ 55   Cervical cancer Mother        died @ 20   Family Psychiatric  History: None reported Social History:  Social History   Substance and Sexual Activity  Alcohol Use No     Social History   Substance and Sexual Activity  Drug Use No  Social History   Socioeconomic History   Marital status: Widowed    Spouse name: Not on file   Number of children: 2   Years of education: 14   Highest education level: Not on file  Occupational History   Occupation: [employer]  Tobacco Use   Smoking status: Former    Years: 20.00    Types: Cigarettes   Smokeless tobacco: Former    Quit date: 11/14/2020  Vaping Use   Vaping Use: Never used  Substance and Sexual Activity   Alcohol use: No   Drug use: No   Sexual activity: Yes  Other Topics Concern   Not on file  Social History Narrative   Lives  with 2 young children in [place].  Wife died in May 02, 2020.  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   Left-handed   Caffeine: occasional tea   Social Determinants of Radio broadcast assistant Strain: Not on file  Food Insecurity: Not on file  Transportation Needs: Not on file  Physical Activity: Not on file  Stress: Not on file  Social Connections: Not on file   Additional Social History:    Sleep: Good  Appetite:  Good  Current Medications: Current Facility-Administered Medications  Medication Dose Route Frequency Provider Last Rate Last Admin   acetaminophen (TYLENOL) tablet 650 mg  650 mg Oral Q6H PRN Lucky Rathke, FNP   650 mg at 02/23/22 1159   alum & mag hydroxide-simeth (MAALOX/MYLANTA) 200-200-20 MG/5ML suspension 30 mL  30 mL Oral Q4H PRN Lucky Rathke, FNP       aspirin EC tablet 81 mg  81 mg Oral Daily Lucky Rathke, FNP   81 mg at 02/23/22 0801   atorvastatin (LIPITOR) tablet 80 mg  80 mg Oral Daily Lucky Rathke, FNP   80 mg at 02/23/22 0800   ezetimibe (ZETIA) tablet 10 mg  10 mg Oral Daily Lucky Rathke, FNP   10 mg at 02/23/22 0800   furosemide (LASIX) tablet 40 mg  40 mg Oral Daily Lucky Rathke, FNP   40 mg at 02/23/22 0800   gabapentin (NEURONTIN) capsule 100 mg  100 mg Oral TID Nicholes Rough, NP   100 mg at 02/23/22 1654   hydrocerin (EUCERIN) cream   Topical BID Nicholes Rough, NP   1 application. at 02/23/22 1654   hydrOXYzine (ATARAX) tablet 25 mg  25 mg Oral TID PRN Lucky Rathke, FNP   25 mg at 02/22/22 0827   ibuprofen (ADVIL) tablet 400 mg  400 mg Oral Q6H PRN Nicholes Rough, NP   400 mg at 02/20/22 2126   isosorbide-hydrALAZINE (BIDIL) 20-37.5 MG per tablet 1 tablet  1 tablet Oral TID Minda Ditto, RPH   1 tablet at 02/23/22 1656   magnesium hydroxide (MILK OF MAGNESIA) suspension 30 mL  30 mL Oral Daily PRN Lucky Rathke, FNP       melatonin tablet 5 mg  5 mg Oral QHS Nkwenti, Doris, NP   5 mg at 02/22/22 2132   OLANZapine  (ZYPREXA) tablet 2.5 mg  2.5 mg Oral Daily Nicholes Rough, NP   2.5 mg at 02/23/22 0801   OLANZapine (ZYPREXA) tablet 7.5 mg  7.5 mg Oral QHS Nkwenti, Doris, NP   7.5 mg at 02/22/22 2132   sacubitril-valsartan (ENTRESTO) 49-51 mg per tablet  1 tablet Oral BID Lucky Rathke, FNP   1 tablet at 02/23/22 1744  sertraline (ZOLOFT) tablet 75 mg  75 mg Oral Daily Nkwenti, Doris, NP   75 mg at 02/23/22 0802   spironolactone (ALDACTONE) tablet 12.5 mg  12.5 mg Oral Daily Lucky Rathke, FNP   12.5 mg at 02/23/22 0801   timolol (TIMOPTIC) 0.5 % ophthalmic solution 1 drop  1 drop Both Eyes QPC breakfast Lucky Rathke, FNP   1 drop at 02/23/22 7673    Lab Results:  Results for orders placed or performed during the hospital encounter of 02/15/22 (from the past 48 hour(s))  Glucose, capillary     Status: Abnormal   Collection Time: 02/22/22  8:14 PM  Result Value Ref Range   Glucose-Capillary 104 (H) 70 - 99 mg/dL    Comment: Glucose reference range applies only to samples taken after fasting for at least 8 hours.   Comment 1 Notify RN    Comment 2 Document in Chart   Glucose, capillary     Status: None   Collection Time: 02/23/22  5:46 AM  Result Value Ref Range   Glucose-Capillary 91 70 - 99 mg/dL    Comment: Glucose reference range applies only to samples taken after fasting for at least 8 hours.    Blood Alcohol level:  Lab Results  Component Value Date   ETH <10 41/93/7902    Metabolic Disorder Labs: Lab Results  Component Value Date   HGBA1C 5.8 12/05/2021   MPG 111.15 12/27/2020   MPG 142.72 07/04/2020   Lab Results  Component Value Date   PROLACTIN 4.8 02/14/2022   Lab Results  Component Value Date   CHOL 166 02/14/2022   TRIG 57 02/14/2022   HDL 54 02/14/2022   CHOLHDL 3.1 02/14/2022   VLDL 11 02/14/2022   LDLCALC 101 (H) 02/14/2022   LDLCALC 40 09/15/2021    Physical Findings: AIMS: Facial and Oral Movements Muscles of Facial Expression: None, normal Lips and  Perioral Area: None, normal Jaw: None, normal Tongue: None, normal,Extremity Movements Upper (arms, wrists, hands, fingers): None, normal Lower (legs, knees, ankles, toes): None, normal, Trunk Movements Neck, shoulders, hips: None, normal, Overall Severity Severity of abnormal movements (highest score from questions above): None, normal Incapacitation due to abnormal movements: None, normal Patient's awareness of abnormal movements (rate only patient's report): No Awareness, Dental Status Current problems with teeth and/or dentures?: No Does patient usually wear dentures?: No  CIWA:    COWS:     Musculoskeletal: Strength & Muscle Tone: within normal limits Gait & Station: normal Patient leans: N/A  Psychiatric Specialty Exam:  Presentation  General Appearance: Appropriate for Environment  Eye Contact:Good  Speech:Clear and Coherent  Speech Volume:Normal  Handedness:Right  Mood and Affect  Mood:Euthymic  Affect:Appropriate  Thought Process  Thought Processes:Coherent  Descriptions of Associations:Intact  Orientation:Full (Time, Place and Person)  Thought Content:Logical  History of Schizophrenia/Schizoaffective disorder:No  Duration of Psychotic Symptoms:No data recorded Hallucinations:Hallucinations: Auditory; Command Description of Command Hallucinations: voices telling him to kill himself Description of Visual Hallucinations: seeing shadows  Ideas of Reference:Paranoia  Suicidal Thoughts:Suicidal Thoughts: Yes, Active SI Active Intent and/or Plan: Without Intent; Without Plan SI Passive Intent and/or Plan: Without Intent; Without Plan  Homicidal Thoughts:Homicidal Thoughts: No  Sensorium  Memory:Immediate Good; Recent Good  Judgment:Poor  Insight:  Executive Functions  Concentration:Fair  Attention Span:Fair  [place]  Psychomotor Activity  Psychomotor Activity:Psychomotor Activity: Decreased;  Psychomotor Retardation  Assets  Assets:Communication Skills  Sleep  Sleep:Sleep: Fair  Physical Exam: Physical Exam  Vitals and nursing note reviewed.  Constitutional:      Appearance: Normal appearance.  HENT:     Head: Normocephalic and atraumatic.     Right Ear: External ear normal.     Left Ear: External ear normal.     Nose: Nose normal.     Mouth/Throat:     Mouth: Mucous membranes are moist.     Pharynx: Oropharynx is clear.  Eyes:     Extraocular Movements: Extraocular movements intact.     Conjunctiva/sclera: Conjunctivae normal.     Pupils: Pupils are equal, round, and reactive to light.  Cardiovascular:     Rate and Rhythm: Normal rate.     Pulses: Normal pulses.     Comments: BP: 160/106. Nursing staff to recheck BP Pulmonary:     Effort: Pulmonary effort is normal.  Abdominal:     Palpations: Abdomen is soft.  Genitourinary:    Comments: Deferred Musculoskeletal:        General: Normal range of motion.     Cervical back: Normal range of motion and neck supple.  Skin:    General: Skin is warm.  Neurological:     General: No focal deficit present.     Mental Status: He is alert and oriented to person, place, and time.   Review of Systems  HENT:  Negative for hearing loss, sinus pain, sore throat and tinnitus.   Eyes: Negative.  Negative for blurred vision, double vision, discharge and redness.  Cardiovascular: Negative.  Negative for chest pain.       BP-160/106 Nursing staff to recheck BP  Gastrointestinal: Negative.  Negative for abdominal pain, blood in stool, constipation, diarrhea, heartburn, melena, nausea and vomiting.  Genitourinary:  Negative for dysuria, frequency and urgency.  Musculoskeletal: Negative.   Skin: Negative.   Neurological:  Negative for dizziness, tingling, tremors, sensory change, speech change, focal weakness, seizures, loss of consciousness, weakness and headaches.  Endo/Heme/Allergies: Negative.   Psychiatric/Behavioral:   Positive for depression. The patient is nervous/anxious.   Blood pressure (!) 160/106, pulse 71, temperature 97.8 F (36.6 C), temperature source Oral, resp. rate 18, height '5\' 9"'$  (1.753 m), weight 85.7 kg, SpO2 100 %. Body mass index is 27.91 kg/m.  Treatment Plan Summary: Daily contact with patient to assess and evaluate symptoms and progress in treatment and Medication management  Physician Treatment Plan for Secondary Diagnosis:  Principal Problem:   MDD (major depressive disorder), recurrent, severe, with psychosis ([place]) Active Problems:   MDD (major depressive disorder), recurrent episode, severe ([place])   Medications MDD recurrent severe with Psychotic Features -Discontinued Prozac on 3/3 -Continue Zoloft to 75 mg daily for depression  -Discontinue Seroquel to 200 mg due to ineffectiveness (02/21/22) -Continue Zyprexa 2.5 mg in the mornings and 5 mg nightly for dpn & psychosis -Continue Remeron 7.5 mg nightly for depression, insomnia & appetite stimulation - Continue Melatonin '5mg'$  qhs   Anxiety -Continue Hydroxyzine 25 mg every 6 hours PRN   Medical Medications for CHF, hyperlipidemia, Glaucoma, DM, HTN, OSA, h/o CVA Continue Lasix 40 mg daily Continue Bidil 20-37.5 mg daily Continue Entresto 49-51 mg daily Continue Aldactone 12.5 mg daily  Continue Timolol eye drops daily Continue Lipitor 80 mg daily Continue Zetia 10 mg daily Continue ASA '81mg'$  daily   Hypokalemia on Lasix - resolved - K+ today 4.0   Hyponatremia (Na+ 134 on 02/17/22) - resolved - Monitor with titration up on SSRI - 137 (CMP from 3/6)   Abnormal EKG - Patient currently asymptomatic -  EKG on 3/4 with results: Normal sinus rhythm Moderate voltage criteria for LVH, may be normal variant ,T wave abnormality, consider lateral ischemia. Repeated on 3/6 with same results.   Other PRNS -Continue Tylenol 650 mg every 6 hours PRN for mild pain -Continue Maalox 30 mg every 4 hrs PRN for indigestion -Continue  Milk of Magnesia as needed every 6 hrs for constipation -Discontinued Trazodone '50mg'$  nightly (h/o CHF) -Continue Melatonin 5 mg nightly for insomnia   Discharge Planning: Social work and case management to assist with discharge planning and identification of hospital follow-up needs prior to discharge Estimated LOS: 5-7 days Discharge Concerns: Need to establish a safety plan; Medication compliance and effectiveness Discharge Goals: Return home with outpatient referrals for mental health follow-up including medication management/psychotherapy    Laretta Bolster, FNP 02/23/2022, 7:51 PM

## 2022-02-23 NOTE — Progress Notes (Signed)
?   02/23/22 2316  ?Psych Admission Type (Psych Patients Only)  ?Admission Status Voluntary  ?Psychosocial Assessment  ?Patient Complaints Anxiety  ?Eye Contact Fair  ?Facial Expression Flat  ?Affect Appropriate to circumstance  ?Speech Logical/coherent  ?Interaction Assertive  ?Motor Activity Slow  ?Appearance/Hygiene Improved  ?Behavior Characteristics Appropriate to situation  ?Mood Anxious  ?Thought Process  ?Coherency WDL  ?Content WDL  ?Delusions None reported or observed  ?Perception Hallucinations  ?Hallucination Auditory  ?Judgment Impaired  ?Confusion None  ?Danger to Self  ?Current suicidal ideation? Passive  ?Self-Injurious Behavior No self-injurious ideation or behavior indicators observed or expressed   ?Agreement Not to Harm Self Yes  ?Description of Agreement verbal  ?Danger to Others  ?Danger to Others None reported or observed  ? ? ?

## 2022-02-23 NOTE — Progress Notes (Deleted)
?   02/23/22 2316  ?Psych Admission Type (Psych Patients Only)  ?Admission Status Voluntary  ?Psychosocial Assessment  ?Patient Complaints Anxiety  ?Eye Contact Fair  ?Facial Expression Flat  ?Affect Appropriate to circumstance  ?Speech Logical/coherent  ?Interaction Assertive  ?Motor Activity Slow  ?Appearance/Hygiene Improved  ?Behavior Characteristics Appropriate to situation  ?Mood Anxious  ?Thought Process  ?Coherency WDL  ?Content WDL  ?Delusions None reported or observed  ?Perception Hallucinations  ?Hallucination Auditory  ?Judgment Impaired  ?Confusion None  ?Danger to Self  ?Current suicidal ideation? Passive  ?Self-Injurious Behavior No self-injurious ideation or behavior indicators observed or expressed   ?Agreement Not to Harm Self Yes  ?Description of Agreement verbal  ?Danger to Others  ?Danger to Others None reported or observed  ? ? ?

## 2022-02-23 NOTE — Plan of Care (Signed)
?  Problem: Education: ?Goal: Ability to state activities that reduce stress will improve ?Outcome: Progressing ?  ?Problem: Education: ?Goal: Utilization of techniques to improve thought processes will improve ?Outcome: Progressing ?Goal: Knowledge of the prescribed therapeutic regimen will improve ?Outcome: Progressing ?  ?Problem: Education: ?Goal: Knowledge of [facility] Education information/materials will improve ?Outcome: Progressing ?Goal: Emotional status will improve ?Outcome: Progressing ?Goal: Mental status will improve ?Outcome: Progressing ?Goal: Verbalization of understanding the information provided will improve ?Outcome: Progressing ?  ?Problem: Education: ?Goal: Ability to make informed decisions regarding treatment will improve ?Outcome: Progressing ?  ?Problem: Education: ?Goal: Ability to make informed decisions regarding treatment will improve ?Outcome: Progressing ?  ?

## 2022-02-24 NOTE — Progress Notes (Signed)
[facility] MD Progress Note  02/24/2022 2:15 PM Matthew [name].  MRN:  [id number]  Subjective: Matthew [name] reports, "I feel a little better today. But, I felt suicidal last night & upon waking up this morning. I also felt paranoid last night, but not right now".  Daily Notes: Patient is examined in his room sitting up in the bed. Chart reviewed, and findings shared with the treatment Team and discussed with Dr. Berdine Addison. Pt continues to present with a flat affect and depressed mood, attention to personal hygiene and grooming is fair, eye contact is good, speech is clear & coherent. Thought contents are organized and logical. Patient reports SI, denied HI. He endorsed AVH. Able to contract for safety on the unit. He also presents with paranoia, states that he feels as though people are out to get him. He rated depression as "8" and anxiety as "4" on a scale of 0 to 10. Patient reported sleeping for at least 7 hours last night and and comsuming 100% of his meals today.Tolerating his medications well without any side effects.  Participating effectively on therapeutic milieu and group activities. Last labs drawn on 02/19/22/23. No change in routine labs. No change in medication therapy today.  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    Principal Problem: MDD (major depressive disorder), recurrent, severe, with psychosis ([place])  Diagnosis: Principal Problem:   MDD (major depressive disorder), recurrent, severe, with psychosis ([place]) Active Problems:   MDD (major depressive disorder), recurrent episode, severe ([place])  Total Time spent with patient:  25  minutes  Past Psychiatric History: Anxiety and MDD  Past Medical History:  Past Medical History:  Diagnosis Date   Acute combined systolic and diastolic CHF, NYHA class 3 ([place])    a. 02/2014 Echo: EF 25-30%.   Cardiomyopathy ([place])    a. 02/2014 Echo: EF 25-30%, sev glob HK with inferolat HK->AK, mod conc LVH, Gr 2 DD, Mild MR, sev dil LA.   Depression    DM (diabetes mellitus) ([place])    High cholesterol    Hypertension    Morbid obesity ([place])    Tobacco abuse     Past Surgical History:  Procedure Laterality Date   CATARACT EXTRACTION Right    LEFT HEART CATHETERIZATION WITH CORONARY ANGIOGRAM N/A 03/04/2014   Procedure: LEFT HEART CATHETERIZATION WITH CORONARY ANGIOGRAM;  Surgeon: Burnell Blanks, MD;  Location: [facility] CATH LAB;  Service: Cardiovascular;  Laterality: N/A;   TEE WITHOUT CARDIOVERSION N/A 02/04/2017   Procedure: TRANSESOPHAGEAL ECHOCARDIOGRAM (TEE);  Surgeon: Dorothy Spark, MD;  Location[facility] ENDOSCOPY;  Service: Cardiovascular;  Laterality: N/A;   Family History:  Family History  Problem Relation Age of Onset   Heart attack Father        died @ 7   Cervical cancer Mother        died @ 70   Family Psychiatric  History: None reported.  Social History:  Social History   Substance and Sexual Activity  Alcohol Use No     Social History   Substance and Sexual Activity  Drug Use No    Social History   Socioeconomic  History   Marital status: Widowed    Spouse name: Not on file   Number of children: 2   Years of education: 14   Highest education level: Not on file  Occupational History   Occupation: [employer]  Tobacco Use   Smoking status: Former    Years: 20.00    Types: Cigarettes   Smokeless tobacco: Former    Quit date: 11/14/2020  Vaping Use   Vaping Use: Never used  Substance and Sexual Activity   Alcohol use: No   Drug use: No   Sexual activity: Yes  Other Topics Concern   Not on file  Social History Narrative   Lives with 2  young children in [place].  Wife died in May 20, 2020.  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   Left-handed   Caffeine: occasional tea   Social Determinants of Radio broadcast assistant Strain: Not on file  Food Insecurity: Not on file  Transportation Needs: Not on file  Physical Activity: Not on file  Stress: Not on file  Social Connections: Not on file   Additional Social History:    Sleep: Good  Appetite:  Good  Current Medications: Current Facility-Administered Medications  Medication Dose Route Frequency Provider Last Rate Last Admin   acetaminophen (TYLENOL) tablet 650 mg  650 mg Oral Q6H PRN Lucky Rathke, FNP   650 mg at 02/24/22 0556   alum & mag hydroxide-simeth (MAALOX/MYLANTA) 200-200-20 MG/5ML suspension 30 mL  30 mL Oral Q4H PRN Lucky Rathke, FNP       aspirin EC tablet 81 mg  81 mg Oral Daily Lucky Rathke, FNP   81 mg at 02/24/22 0802   atorvastatin (LIPITOR) tablet 80 mg  80 mg Oral Daily Lucky Rathke, FNP   80 mg at 02/24/22 0803   ezetimibe (ZETIA) tablet 10 mg  10 mg Oral Daily Lucky Rathke, FNP   10 mg at 02/24/22 0805   furosemide (LASIX) tablet 40 mg  40 mg Oral Daily Lucky Rathke, FNP   40 mg at 02/24/22 0803   gabapentin (NEURONTIN) capsule 100 mg  100 mg Oral TID Nicholes Rough, NP   100 mg at 02/24/22 1142   hydrocerin (EUCERIN) cream   Topical BID Nicholes Rough, NP   1 application. at 02/24/22 0807   hydrOXYzine (ATARAX) tablet 25 mg  25 mg Oral TID PRN Lucky Rathke, FNP   25 mg at 02/22/22 0827   ibuprofen (ADVIL) tablet 400 mg  400 mg Oral Q6H PRN Nicholes Rough, NP   400 mg at 02/23/22 2133   isosorbide-hydrALAZINE (BIDIL) 20-37.5 MG per tablet 1 tablet  1 tablet Oral TID Minda Ditto, RPH   1 tablet at 02/24/22 1142   magnesium hydroxide (MILK OF MAGNESIA) suspension 30 mL  30 mL Oral Daily PRN Lucky Rathke, FNP       melatonin tablet 5 mg  5 mg Oral QHS Nkwenti, Doris, NP   5 mg at 02/23/22 2133   OLANZapine (ZYPREXA)  tablet 2.5 mg  2.5 mg Oral Daily Nicholes Rough, NP   2.5 mg at 02/24/22 0805   OLANZapine (ZYPREXA) tablet 7.5 mg  7.5 mg Oral QHS Nkwenti, Doris, NP   7.5 mg at 02/23/22 2133   sacubitril-valsartan (ENTRESTO) 49-51 mg per tablet  1 tablet Oral BID Lucky Rathke, FNP   1 tablet at 02/24/22 0804   sertraline (ZOLOFT) tablet 75 mg  75 mg Oral Daily Nicholes Rough, NP   75 mg at 02/24/22 1027   spironolactone (ALDACTONE) tablet 12.5 mg  12.5 mg Oral Daily Lucky Rathke, FNP   12.5 mg at 02/24/22 0803   timolol (TIMOPTIC) 0.5 % ophthalmic solution 1 drop  1 drop Both Eyes QPC breakfast Lucky Rathke, FNP   1 drop at 02/24/22 2536   Lab Results:  Results for orders placed or performed during the hospital encounter of 02/15/22 (from the past 48 hour(s))  Glucose, capillary     Status: Abnormal   Collection Time: 02/22/22  8:14 PM  Result Value Ref Range   Glucose-Capillary 104 (H) 70 - 99 mg/dL    Comment: Glucose reference range applies only to samples taken after fasting for at least 8 hours.   Comment 1 Notify RN    Comment 2 Document in Chart   Glucose, capillary     Status: None   Collection Time: 02/23/22  5:46 AM  Result Value Ref Range   Glucose-Capillary 91 70 - 99 mg/dL    Comment: Glucose reference range applies only to samples taken after fasting for at least 8 hours.   Blood Alcohol level:  Lab Results  Component Value Date   ETH <10 64/40/3474   Metabolic Disorder Labs: Lab Results  Component Value Date   HGBA1C 5.8 12/05/2021   MPG 111.15 12/27/2020   MPG 142.72 07/04/2020   Lab Results  Component Value Date   PROLACTIN 4.8 02/14/2022   Lab Results  Component Value Date   CHOL 166 02/14/2022   TRIG 57 02/14/2022   HDL 54 02/14/2022   CHOLHDL 3.1 02/14/2022   VLDL 11 02/14/2022   LDLCALC 101 (H) 02/14/2022   LDLCALC 40 09/15/2021   Physical Findings: AIMS: Facial and Oral Movements Muscles of Facial Expression: None, normal Lips and Perioral Area: None,  normal Jaw: None, normal Tongue: None, normal,Extremity Movements Upper (arms, wrists, hands, fingers): None, normal Lower (legs, knees, ankles, toes): None, normal, Trunk Movements Neck, shoulders, hips: None, normal, Overall Severity Severity of abnormal movements (highest score from questions above): None, normal Incapacitation due to abnormal movements: None, normal Patient's awareness of abnormal movements (rate only patient's report): No Awareness, Dental Status Current problems with teeth and/or dentures?: No Does patient usually wear dentures?: No  CIWA:    COWS:     Musculoskeletal: Strength & Muscle Tone: within normal limits Gait & Station: normal Patient leans: N/A  Psychiatric Specialty Exam:  Presentation  General Appearance: Appropriate for Environment  Eye Contact:Good  Speech:Clear and Coherent  Speech Volume:Normal  Handedness:Right  Mood and Affect  Mood:Euthymic  Affect:Appropriate  Thought Process  Thought Processes:Coherent  Descriptions of Associations:Intact  Orientation:Full (Time, Place and Person)  Thought Content:Logical  History of Schizophrenia/Schizoaffective disorder:No  Duration of Psychotic Symptoms:No data recorded Hallucinations:No data recorded  Ideas of Reference:Paranoia  Suicidal Thoughts:No data recorded  Homicidal Thoughts:No data recorded  Sensorium  Memory:Immediate Good; Recent Good  Judgment:Poor  Insight:  Executive Functions  Concentration:Fair  Attention Span:Fair  [place]  Psychomotor Activity  Psychomotor Activity:No data recorded  Assets  Assets:Communication Skills  Sleep  Sleep:No data recorded  Physical Exam: Physical Exam Vitals and nursing note reviewed.  Constitutional:      Appearance: Normal appearance.  HENT:     Head: Normocephalic and atraumatic.     Right Ear: External ear normal.     Left Ear: External ear normal.  Nose: Nose normal.     Mouth/Throat:     Mouth: Mucous membranes are moist.     Pharynx: Oropharynx is clear.  Eyes:     Extraocular Movements: Extraocular movements intact.     Conjunctiva/sclera: Conjunctivae normal.     Pupils: Pupils are equal, round, and reactive to light.  Cardiovascular:     Rate and Rhythm: Normal rate.     Pulses: Normal pulses.     Comments: BP: 160/106. Nursing staff to recheck BP Pulmonary:     Effort: Pulmonary effort is normal.  Abdominal:     Palpations: Abdomen is soft.  Genitourinary:    Comments: Deferred Musculoskeletal:        General: Normal range of motion.     Cervical back: Normal range of motion and neck supple.  Skin:    General: Skin is warm.  Neurological:     General: No focal deficit present.     Mental Status: He is alert and oriented to person, place, and time.   Review of Systems  HENT:  Negative for hearing loss, sinus pain, sore throat and tinnitus.   Eyes: Negative.  Negative for blurred vision, double vision, discharge and redness.  Cardiovascular: Negative.  Negative for chest pain.       BP-160/106 Nursing staff to recheck BP  Gastrointestinal: Negative.  Negative for abdominal pain, blood in stool, constipation, diarrhea, heartburn, melena, nausea and vomiting.  Genitourinary:  Negative for dysuria, frequency and urgency.  Musculoskeletal: Negative.   Skin: Negative.   Neurological:  Negative for dizziness, tingling, tremors, sensory change, speech change, focal weakness, seizures, loss of consciousness, weakness and headaches.  Endo/Heme/Allergies: Negative.   Psychiatric/Behavioral:  Positive for depression. The patient is nervous/anxious.   Blood pressure (!) 150/100, pulse 67, temperature 97.8 F (36.6 C), temperature source Oral, resp. rate 18, height '5\' 9"'$  (1.753 m), weight 85.7 kg, SpO2 100 %. Body mass index is 27.91 kg/m.  Treatment Plan Summary: Daily contact with patient to assess and evaluate symptoms and  progress in treatment and Medication management.   Continue inpatient hospitalization.  Will continue today 02/24/2022 plan as below except where it is noted.   Physician Treatment Plan for Secondary Diagnosis:  Principal Problem:   MDD (major depressive disorder), recurrent, severe, with psychosis ([place]) Active Problems:   MDD (major depressive disorder), recurrent episode, severe ([facility])   Medications MDD recurrent severe with Psychotic Features -Discontinued Prozac on 3/3 -Continue Zoloft 75 mg daily for depression  -Discontinue Seroquel 200 mg due to ineffectiveness (02/21/22) -Continue Zyprexa 2.5 mg in the mornings.  -Continue Zyprexa 5 mg nightly for dpn & psychosis -Continue Remeron 7.5 mg nightly for depression, insomnia & appetite stimulation - Continue Melatonin '5mg'$  qhs   Anxiety -Continue Hydroxyzine 25 mg every 6 hours PRN   Medical Medications for CHF, hyperlipidemia, Glaucoma, DM, HTN, OSA, h/o CVA Continue Lasix 40 mg daily Continue Bidil 20-37.5 mg daily Continue Entresto 49-51 mg daily Continue Aldactone 12.5 mg daily  Continue Timolol eye drops daily Continue Lipitor 80 mg daily Continue Zetia 10 mg daily Continue ASA '81mg'$  daily   Hypokalemia on Lasix - resolved - K+ today 4.0   Hyponatremia (Na+ 134 on 02/17/22) - resolved - Monitor with titration up on SSRI - 137 (CMP from 3/6)   Abnormal EKG - Patient currently asymptomatic - EKG on 3/4 with results: Normal sinus rhythm Moderate voltage criteria for LVH, may be normal variant ,T wave abnormality, consider lateral ischemia. Repeated on 3/6 with  same results.   Other PRNS -Continue Tylenol 650 mg every 6 hours PRN for mild pain -Continue Maalox 30 mg every 4 hrs PRN for indigestion -Continue Milk of Magnesia as needed every 6 hrs for constipation -Discontinued Trazodone '50mg'$  nightly (h/o CHF) -Continue Melatonin 5 mg nightly for insomnia   Discharge Planning: Social work and case management to assist  with discharge planning and identification of hospital follow-up needs prior to discharge Estimated LOS: 5-7 days Discharge Concerns: Need to establish a safety plan; Medication compliance and effectiveness Discharge Goals: Return home with outpatient referrals for mental health follow-up including medication management/psychotherapy   Lindell Spar, NP, pmhnp, fnp-bc. 02/24/2022, 2:15 PM Patient ID: Matthew [name]., male   DOB: 03/20/1966, 56 y.o.   MRN: [id number]

## 2022-02-24 NOTE — Progress Notes (Signed)
?   02/24/22 2256  ?Psych Admission Type (Psych Patients Only)  ?Admission Status Voluntary  ?Psychosocial Assessment  ?Patient Complaints Anxiety;Insomnia  ?Eye Contact Fair  ?Facial Expression Flat ?(brightens with conversation)  ?Affect Appropriate to circumstance  ?Speech Logical/coherent  ?Interaction Assertive  ?Motor Activity Slow  ?Appearance/Hygiene Improved  ?Behavior Characteristics Appropriate to situation  ?Mood Pleasant  ?Thought Process  ?Coherency WDL  ?Content WDL  ?Delusions None reported or observed  ?Perception WDL  ?Hallucination None reported or observed  ?Judgment Impaired  ?Confusion None  ?Danger to Self  ?Current suicidal ideation? Denies  ?Self-Injurious Behavior No self-injurious ideation or behavior indicators observed or expressed   ?Agreement Not to Harm Self Yes  ?Description of Agreement verbal  ?Danger to Others  ?Danger to Others None reported or observed  ? ? ?

## 2022-02-24 NOTE — Group Note (Signed)
LCSW Group Therapy Note ? ?Group Date: 02/24/2022 ?Start Time: 1000 ?End Time: 1100 ? ? ?Type of Therapy and Topic:  Group Therapy: Anger Cues and Responses ? ?Participation Level:  Active ? ? ?Description of Group:   ?In this group, patients learned how to recognize the physical, cognitive, emotional, and behavioral responses they have to anger-provoking situations.  They identified a recent time they became angry and how they reacted.  They analyzed how their reaction was possibly beneficial and how it was possibly unhelpful.  The group discussed a variety of healthier coping skills that could help with such a situation in the future.  Focus was placed on how helpful it is to recognize the underlying emotions to our anger, because working on those can lead to a more permanent solution as well as our ability to focus on the important rather than the urgent. ? ?Therapeutic Goals: ?Patients will remember their last incident of anger and how they felt emotionally and physically, what their thoughts were at the time, and how they behaved. ?Patients will identify how their behavior at that time worked for them, as well as how it worked against them. ?Patients will explore possible new behaviors to use in future anger situations. ?Patients will learn that anger itself is normal and cannot be eliminated, and that healthier reactions can assist with resolving conflict rather than worsening situations. ? ?Summary of Patient Progress:  Matthew [name] was active during the group. He shared a recent occurrence last week that led to anger. He said that he chose to come here to the hospital to work on his issues.   He demonstrated good insight into the subject matter, was respectful of peers, and participated throughout the entire session. ? ?Therapeutic Modalities:   ?Cognitive Behavioral Therapy ? ? ? ?Maretta Los, LCSWA ?02/24/2022  11:23 AM   ? ?

## 2022-02-24 NOTE — Progress Notes (Signed)
The patient rated his day as a 4 out of 10. He states that he had some "positive thoughts" today. His goal for tomorrow is to have more positive thoughts.  ?

## 2022-02-24 NOTE — [facility] Group Notes (Signed)
.  Psychoeducational Group Note ? ?Date: 02/24/2022 ?Time: 0900-1000 ? ? ? ?Goal Setting  ? ?Purpose of Group: This group helps to provide patients with the steps of setting a goal that is specific, measurable, attainable, realistic and time specific. A discussion on how we keep ourselves stuck with negative self talk. Homework given for Patients to write 30 positive attributes about themselves. ? ? ? ?Participation Level:  Active ? ?Participation Quality:  Appropriate ? ?Affect:  Appropriate ? ?Cognitive:  Appropriate ? ?Insight:  Improving ? ?Engagement in Group:  Engaged ? ?Additional Comments:  Rates his energy at a 8/10. Participated in the group. Gave and received feedback. ? ?[name] ?

## 2022-02-24 NOTE — [facility] Group Notes (Signed)
Psychoeducational Group Note ? ? ? ?Date:02/24/22 ?Time: 1300-1400 ? ? ? ?Purpose of Group: . The group focus' on teaching patients on how to identify their needs and their Life Skills:  A group where two lists are made. What people need and what are things that we do that are unhealthy. The lists are developed by the patients and it is explained that we often do the actions that are not healthy to get our list of needs met. ? ?Goal:: to develop the coping skills needed to get their needs met ? ?Participation Level:  Active ? ?Participation Quality:  Appropriate ? ?Affect:  Appropriate ? ?Cognitive:  Oriented ? ?Insight:  Improving ? ?Engagement in Group:  Engaged ? ?Additional Comments:  Pt attended the group. Rates himself at a 4/10. Participated fully in the group. ? ?[name] ? ?

## 2022-02-24 NOTE — Progress Notes (Signed)
D. Pt reported poor sleep last night, stating that he was "up and down" throughout the night. Pt currently denies SI, but stated that he was having suicidal thoughts when he first woke up. Per pt's self inventory, pt rated his depression, hopelessness and anxiety an 8/8/9, respectively. Pt reported that his goal today was "to work on getting better physically and emotionally by having positive thoughts." Pt has been visible in the milieu interacting appropriately with peers, observed attending groups. Marland Kitchen  ?A. Labs and vitals monitored. Pt given and educated on medications. Pt supported emotionally and encouraged to express concerns and ask questions.   ?R. Pt remains safe with 15 minute checks. Will continue POC. ? ?  ?

## 2022-02-25 NOTE — Plan of Care (Signed)
Nurse discussed coping skills with patient.  

## 2022-02-25 NOTE — Progress Notes (Signed)
The patient rated his day as a 5 out of 10 since he enjoyed one of the groups. His goal for tomorrow is to have "positive thoughts".  ?

## 2022-02-25 NOTE — [facility] Group Notes (Signed)
LCSW Group Therapy Note ? ?No social work group was held today due to a scheduling conflict.  Another licensed group was held by an Therapist, sports. ? ?Selmer Dominion, LCSW ?10/07/2021 ?1:51 PM  ?  ? ?

## 2022-02-25 NOTE — [facility] Group Notes (Signed)
Adult Psychoeducational Group Not ?Date:  02/25/2022 ?Time:  1324-4010 ?Group Topic/Focus: PROGRESSIVE RELAXATION. [name] group where deep breathing is taught and tensing and relaxation muscle groups is used. Imagery is used as well.  Pts are asked to imagine 3 pillars that hold them up when they are not able to hold themselves up and to share that with the group. ? ?Participation Level:  Active ? ?Participation Quality:  Appropriate ? ?Affect:  Appropriate ? ?Cognitive:  Oriented ? ?Insight: Improving ? ?Engagement in Group:  Engaged ? ?Modes of Intervention:  Activity, Discussion, Education, and Support ? ?Additional Comments:  Rates his energy at [name] 5/10. States his mother, his son and his daughter hold him up when hie is not able to hold himself up ? ?Matthew [name] ? ?

## 2022-02-25 NOTE — Progress Notes (Signed)
?   02/25/22 2233  ?Psych Admission Type (Psych Patients Only)  ?Admission Status Voluntary  ?Psychosocial Assessment  ?Patient Complaints None  ?Eye Contact Fair  ?Facial Expression Flat ?(brightens with conversation)  ?Affect Appropriate to circumstance  ?Speech Logical/coherent  ?Interaction Assertive  ?Motor Activity Other (Comment) ?(WDL)  ?Appearance/Hygiene Improved  ?Behavior Characteristics Appropriate to situation  ?Mood Pleasant  ?Thought Process  ?Coherency WDL  ?Content WDL  ?Delusions None reported or observed  ?Perception WDL  ?Hallucination None reported or observed  ?Judgment Impaired  ?Confusion None  ?Danger to Self  ?Current suicidal ideation? Passive  ?Self-Injurious Behavior No self-injurious ideation or behavior indicators observed or expressed   ?Agreement Not to Harm Self Yes  ?Description of Agreement verbal  ?Danger to Others  ?Danger to Others None reported or observed  ? ? ?

## 2022-02-25 NOTE — Group Note (Signed)
Date:  02/25/2022 ?Time:  9:00 AM ? ?Group Topic/Focus:  ?Orientation:   The focus of this group is to educate the patient on the purpose and policies of crisis stabilization and provide a format to answer questions about their admission.  The group details unit policies and expectations of patients while admitted. ? ? ? ?Participation Level:  Minimal ? ?Participation Quality:  Inattentive ? ?Affect:  Flat ? ?Cognitive:  Lacking ? ?Insight: Lacking ? ?Engagement in Group:  Lacking ? ?Modes of Intervention:  Discussion ? ?Additional Comments:  Patient came to group late and was not engaged as evidenced by lack of participation. ? ?Jerrye Beavers ?02/25/2022, 9:00 AM ? ?

## 2022-02-25 NOTE — Progress Notes (Signed)
[facility] MD Progress Note  02/25/2022 1:28 PM Matthew [name].  MRN:  [id number]  Subjective: Matthew [name] reports, "I'm doing okay. My mood is good this morning. I have not still been able to find shelter to go to after discharge. I have been calling around. The [facility] is full. The social worker from the [place] has put in an application for me at the [employer]. I have not heard anything yet. I did hear a voice earlier this morning, but it was not like scary scary. I'm still having the SI, off & on. I feel safe here".  Daily Notes: Matthew [name] is seen, chart reviewed, the chart findings discussed with the treatment team. He presents today with an improved affect & good eye contact. His speech is clear & coherent. Thought contents are organized and logical. Patient reports passive SI, denies any HI. He endorsed AH earlier this morning, but not at this time. He is able to contract for safety on the unit.  He rated depression as #4 & anxiety as #2 on a scale of 0 to 10. Patient reported sleeping well, has a good appetite.Tolerating his medications well without any side effects. Participating effectively on therapeutic milieu and group activities. Last labs drawn on 02/19/22/23. No change in routine labs. No change in medication therapy today. Will continue current plan of care as already in progress.    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    Principal Problem: MDD (major depressive disorder), recurrent, severe, with psychosis ([place])  Diagnosis: Principal Problem:   MDD (major depressive disorder), recurrent, severe,  with psychosis ([place]) Active Problems:   MDD (major depressive disorder), recurrent episode, severe ([place])  Total Time spent with patient:  25 minutes  Past Psychiatric History: Anxiety and MDD  Past Medical History:  Past Medical History:  Diagnosis Date   Acute combined systolic and diastolic CHF, NYHA class 3 ([place])    a. 02/2014 Echo: EF 25-30%.   Cardiomyopathy ([place])    a. 02/2014 Echo: EF 25-30%, sev glob HK with inferolat HK->AK, mod conc LVH, Gr 2 DD, Mild MR, sev dil LA.   Depression    DM (diabetes mellitus) ([place])    High cholesterol    Hypertension    Morbid obesity ([place])    Tobacco abuse     Past Surgical History:  Procedure Laterality Date   CATARACT EXTRACTION Right    LEFT HEART CATHETERIZATION WITH CORONARY ANGIOGRAM N/A 03/04/2014   Procedure: LEFT HEART CATHETERIZATION WITH CORONARY ANGIOGRAM;  Surgeon: Burnell Blanks, MD;  Location: [facility] CATH LAB;  Service: Cardiovascular;  Laterality: N/A;   TEE WITHOUT CARDIOVERSION N/A 02/04/2017   Procedure: TRANSESOPHAGEAL ECHOCARDIOGRAM (TEE);  Surgeon: Dorothy Spark, MD;  Location: [facility] ENDOSCOPY;  Service: Cardiovascular;  Laterality: N/A;   Family History:  Family History  Problem Relation Age of Onset   Heart attack Father        died @ 37   Cervical cancer Mother        died @ 16   Family Psychiatric  History: None reported.  Social History:  Social History  Substance and Sexual Activity  Alcohol Use No     Social History   Substance and Sexual Activity  Drug Use No    Social History   Socioeconomic History   Marital status: Widowed    Spouse name: Not on file   Number of children: 2   Years of education: 14   Highest education level: Not on file  Occupational History   Occupation: [employer]  Tobacco Use   Smoking status: Former    Years: 20.00    Types: Cigarettes   Smokeless tobacco: Former    Quit date: 11/14/2020  Vaping Use   Vaping Use: Never used  Substance and Sexual  Activity   Alcohol use: No   Drug use: No   Sexual activity: Yes  Other Topics Concern   Not on file  Social History Narrative   Lives with 2 young children in [place].  Wife died in 05-09-20.  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   Left-handed   Caffeine: occasional tea   Social Determinants of Radio broadcast assistant Strain: Not on file  Food Insecurity: Not on file  Transportation Needs: Not on file  Physical Activity: Not on file  Stress: Not on file  Social Connections: Not on file   Additional Social History:    Sleep: Good  Appetite:  Good  Current Medications: Current Facility-Administered Medications  Medication Dose Route Frequency Provider Last Rate Last Admin   acetaminophen (TYLENOL) tablet 650 mg  650 mg Oral Q6H PRN Lucky Rathke, FNP   650 mg at 02/25/22 0814   alum & mag hydroxide-simeth (MAALOX/MYLANTA) 200-200-20 MG/5ML suspension 30 mL  30 mL Oral Q4H PRN Lucky Rathke, FNP       aspirin EC tablet 81 mg  81 mg Oral Daily Lucky Rathke, FNP   81 mg at 02/25/22 0809   atorvastatin (LIPITOR) tablet 80 mg  80 mg Oral Daily Lucky Rathke, FNP   80 mg at 02/25/22 2876   ezetimibe (ZETIA) tablet 10 mg  10 mg Oral Daily Lucky Rathke, FNP   10 mg at 02/25/22 8115   furosemide (LASIX) tablet 40 mg  40 mg Oral Daily Lucky Rathke, FNP   40 mg at 02/25/22 0809   gabapentin (NEURONTIN) capsule 100 mg  100 mg Oral TID Nicholes Rough, NP   100 mg at 02/25/22 1219   hydrocerin (EUCERIN) cream   Topical BID Nicholes Rough, NP   Given at 02/25/22 0810   hydrOXYzine (ATARAX) tablet 25 mg  25 mg Oral TID PRN Lucky Rathke, FNP   25 mg at 02/24/22 2117   ibuprofen (ADVIL) tablet 400 mg  400 mg Oral Q6H PRN Nicholes Rough, NP   400 mg at 02/23/22 2133   isosorbide-hydrALAZINE (BIDIL) 20-37.5 MG per tablet 1 tablet  1 tablet Oral TID Minda Ditto, RPH   1 tablet at 02/25/22 1219   magnesium hydroxide (MILK OF MAGNESIA) suspension 30 mL  30 mL Oral  Daily PRN Lucky Rathke, FNP       melatonin tablet 5 mg  5 mg Oral QHS Nkwenti, Doris, NP   5 mg at 02/24/22 2117   OLANZapine (ZYPREXA) tablet 2.5 mg  2.5 mg Oral Daily Nicholes Rough, NP   2.5 mg at 02/25/22 0808   OLANZapine (ZYPREXA) tablet 7.5 mg  7.5 mg Oral QHS Nicholes Rough, NP   7.5 mg at 02/24/22 2117  sacubitril-valsartan (ENTRESTO) 49-51 mg per tablet  1 tablet Oral BID Lucky Rathke, FNP   1 tablet at 02/25/22 0809   sertraline (ZOLOFT) tablet 75 mg  75 mg Oral Daily Nicholes Rough, NP   75 mg at 02/25/22 8119   spironolactone (ALDACTONE) tablet 12.5 mg  12.5 mg Oral Daily Lucky Rathke, FNP   12.5 mg at 02/25/22 0807   timolol (TIMOPTIC) 0.5 % ophthalmic solution 1 drop  1 drop Both Eyes QPC breakfast Lucky Rathke, FNP   1 drop at 02/25/22 1478   Lab Results:  No results found for this or any previous visit (from the past 48 hour(s)).  Blood Alcohol level:  Lab Results  Component Value Date   ETH <10 29/56/2130   Metabolic Disorder Labs: Lab Results  Component Value Date   HGBA1C 5.8 12/05/2021   MPG 111.15 12/27/2020   MPG 142.72 07/04/2020   Lab Results  Component Value Date   PROLACTIN 4.8 02/14/2022   Lab Results  Component Value Date   CHOL 166 02/14/2022   TRIG 57 02/14/2022   HDL 54 02/14/2022   CHOLHDL 3.1 02/14/2022   VLDL 11 02/14/2022   LDLCALC 101 (H) 02/14/2022   LDLCALC 40 09/15/2021   Physical Findings: AIMS: Facial and Oral Movements Muscles of Facial Expression: None, normal Lips and Perioral Area: None, normal Jaw: None, normal Tongue: None, normal,Extremity Movements Upper (arms, wrists, hands, fingers): None, normal Lower (legs, knees, ankles, toes): None, normal, Trunk Movements Neck, shoulders, hips: None, normal, Overall Severity Severity of abnormal movements (highest score from questions above): None, normal Incapacitation due to abnormal movements: None, normal Patient's awareness of abnormal movements (rate only patient's  report): No Awareness, Dental Status Current problems with teeth and/or dentures?: No Does patient usually wear dentures?: No  CIWA:    COWS:     Musculoskeletal: Strength & Muscle Tone: within normal limits Gait & Station: normal Patient leans: N/A  Psychiatric Specialty Exam:  Presentation  General Appearance: Appropriate for Environment; Casual; Fairly Groomed  Eye Contact:Good  Speech:Clear and Coherent; Normal Rate  Speech Volume:Normal  Handedness:Right  Mood and Affect  Mood:-- ("Improving")  Affect:Congruent  Thought Process  Thought Processes:Coherent; Goal Directed; Linear  Descriptions of Associations:Intact  Orientation:Full (Time, Place and Person)  Thought Content:Logical  History of Schizophrenia/Schizoaffective disorder:No  Duration of Psychotic Symptoms:No data recorded Hallucinations:Hallucinations: Auditory Description of Command Hallucinations: NA Description of Auditory Hallucinations: Unable to make out what the voices were saying. Description of Visual Hallucinations: NA   Ideas of Reference:None  Suicidal Thoughts:Suicidal Thoughts: Yes, Passive SI Active Intent and/or Plan: Without Intent; Without Plan; Without Means to Carry Out; Without Access to Means SI Passive Intent and/or Plan: Without Intent; Without Plan; Without Means to Carry Out; Without Access to Means   Homicidal Thoughts:Homicidal Thoughts: No   Sensorium  Memory:Immediate Good; Recent Good; Remote Good  Judgment:Good  Insight:  Executive Functions  Concentration:Good  Attention Span:Good  [place] of Knowledge:Good  Language:Good  Psychomotor Activity  Psychomotor Activity:Psychomotor Activity: Normal   Assets  Assets:Communication Skills; Resilience; Desire for Improvement  Sleep  Sleep:Sleep: Fair Number of Hours of Sleep: 5.5   Physical Exam: Physical Exam Vitals and nursing note reviewed.  Constitutional:      Appearance:  Normal appearance.  HENT:     Head: Normocephalic and atraumatic.     Right Ear: External ear normal.     Left Ear: External ear normal.     Nose: Nose normal.  Mouth/Throat:     Mouth: Mucous membranes are moist.     Pharynx: Oropharynx is clear.  Eyes:     Extraocular Movements: Extraocular movements intact.     Conjunctiva/sclera: Conjunctivae normal.     Pupils: Pupils are equal, round, and reactive to light.  Cardiovascular:     Rate and Rhythm: Normal rate.     Pulses: Normal pulses.     Comments: BP: 160/106. Nursing staff to recheck BP Pulmonary:     Effort: Pulmonary effort is normal.  Abdominal:     Palpations: Abdomen is soft.  Genitourinary:    Comments: Deferred Musculoskeletal:        General: Normal range of motion.     Cervical back: Normal range of motion and neck supple.  Skin:    General: Skin is warm.  Neurological:     General: No focal deficit present.     Mental Status: He is alert and oriented to person, place, and time.   Review of Systems  HENT:  Negative for hearing loss, sinus pain, sore throat and tinnitus.   Eyes: Negative.  Negative for blurred vision, double vision, discharge and redness.  Cardiovascular: Negative.  Negative for chest pain.       BP-160/106 Nursing staff to recheck BP  Gastrointestinal: Negative.  Negative for abdominal pain, blood in stool, constipation, diarrhea, heartburn, melena, nausea and vomiting.  Genitourinary:  Negative for dysuria, frequency and urgency.  Musculoskeletal: Negative.   Skin: Negative.   Neurological:  Negative for dizziness, tingling, tremors, sensory change, speech change, focal weakness, seizures, loss of consciousness, weakness and headaches.  Endo/Heme/Allergies: Negative.   Psychiatric/Behavioral:  Positive for depression. The patient is nervous/anxious.   Blood pressure (!) 149/101, pulse 64, temperature 97.9 F (36.6 C), temperature source Oral, resp. rate 18, height '5\' 9"'$  (1.753 m),  weight 85.7 kg, SpO2 100 %. Body mass index is 27.91 kg/m.  Treatment Plan Summary: Daily contact with patient to assess and evaluate symptoms and progress in treatment and Medication management.   Continue inpatient hospitalization.  Will continue today 02/25/2022 plan as below except where it is noted.   Physician Treatment Plan for Secondary Diagnosis:  Principal Problem:   MDD (major depressive disorder), recurrent, severe, with psychosis ([place]) Active Problems:   MDD (major depressive disorder), recurrent episode, severe ([facility])   Medications MDD recurrent severe with Psychotic Features -Discontinued Prozac on 3/3 -Continue Zoloft 75 mg daily for depression  -Discontinue Seroquel 200 mg due to ineffectiveness (02/21/22) -Continue Zyprexa 2.5 mg in the mornings.  -Continue Zyprexa 5 mg nightly for dpn & psychosis -Continue Remeron 7.5 mg nightly for depression, insomnia & appetite stimulation - Continue Melatonin '5mg'$  qhs   Anxiety -Continue Hydroxyzine 25 mg every 6 hours PRN   Medical Medications for CHF, hyperlipidemia, Glaucoma, DM, HTN, OSA, h/o CVA Continue Lasix 40 mg daily Continue Bidil 20-37.5 mg daily Continue Entresto 49-51 mg daily Continue Aldactone 12.5 mg daily  Continue Timolol eye drops daily Continue Lipitor 80 mg daily Continue Zetia 10 mg daily Continue ASA '81mg'$  daily   Hypokalemia on Lasix - resolved - K+ today 4.0   Hyponatremia (Na+ 134 on 02/17/22) - resolved - Monitor with titration up on SSRI - 137 (CMP from 3/6)   Abnormal EKG - Patient currently asymptomatic - EKG on 3/4 with results: Normal sinus rhythm Moderate voltage criteria for LVH, may be normal variant ,T wave abnormality, consider lateral ischemia. Repeated on 3/6 with same results.   Other PRNS -Continue  Tylenol 650 mg every 6 hours PRN for mild pain -Continue Maalox 30 mg every 4 hrs PRN for indigestion -Continue Milk of Magnesia as needed every 6 hrs for constipation -Discontinued  Trazodone '50mg'$  nightly (h/o CHF) -Continue Melatonin 5 mg nightly for insomnia   Discharge Planning: Social work and case management to assist with discharge planning and identification of hospital follow-up needs prior to discharge Estimated LOS: 5-7 days Discharge Concerns: Need to establish a safety plan; Medication compliance and effectiveness Discharge Goals: Return home with outpatient referrals for mental health follow-up including medication management/psychotherapy   Lindell Spar, NP, pmhnp, fnp-bc. 02/25/2022, 1:28 PM Patient ID: Matthew [name]., male   DOB: Jul 05, 1966, 56 y.o.   MRN: [id number] Patient ID: Matthew [name]., male   DOB: May 22, 1966, 56 y.o.   MRN: [id number]

## 2022-02-25 NOTE — [facility] Group Notes (Signed)
Adult Psychoeducational Group  ?Date:  02/25/2022 ?Time:  5284-1324 ?Group Topic/Focus: Continuation of the group from Saturday. Looking at the lists that were created and talking about what needs to be done with the homework of 30 positives about themselves.  ?                                   Talking about taking their power back and helping themselves to develop a positive self esteem. ?     ?Participation Quality:  Appropriate ? ?Affect:  Appropriate ? ?Cognitive:  Oriented ? ?Insight: Improving ? ?Engagement in Group:  Engaged ? ?Modes of Intervention:  Activity, Discussion, Education, and Support ? ?Additional Comments:   ?Rates energy at a 5/10. Participated fully. ? ?[name] ? ?

## 2022-02-25 NOTE — Progress Notes (Signed)
D:  Patient's self inventory sheet, patient sleeps good, sleep medication helpful.  Fair appetite, normal energy level, good concentration.  Rated depression 8, hopeless 3, anxiety 7.  Denied withdrawals.  SI, contracts for safety.  Physical problems, lightheaded, dizzy, headaches, blurred vision.  Physical pain, worst pain #8 in past 24 hours. Toe, pain medicine helpful.  Positive thought is goal.  Plans to concentrate.  No discharge plans. ?A:  Medications administered per MD orders.  Emotional support and encouragement given patient. ?R:  SI continues, contracts for safety.  Denied HI.  Continues to hear voices to hurt himself and sees shadows.  Safety maintained with 15 minute checks. ? ?

## 2022-02-26 ENCOUNTER — Encounter (HOSPITAL_COMMUNITY): Payer: Self-pay

## 2022-02-26 MED ORDER — SERTRALINE HCL 25 MG PO TABS
75.0000 mg | ORAL_TABLET | Freq: Every day | ORAL | Status: DC
  Filled 2022-02-26 (×2): qty 21

## 2022-02-26 MED ORDER — HYDROXYZINE HCL 25 MG PO TABS: 25.0000 mg | ORAL_TABLET | Freq: Three times a day (TID) | ORAL | 0 refills | Status: DC | PRN

## 2022-02-26 MED ORDER — EZETIMIBE 10 MG PO TABS: 10.0000 mg | ORAL_TABLET | Freq: Every day | ORAL | 0 refills | Status: DC

## 2022-02-26 MED ORDER — SACUBITRIL-VALSARTAN 49-51 MG PO TABS: 1.0000 | ORAL_TABLET | Freq: Two times a day (BID) | ORAL | 0 refills | Status: DC

## 2022-02-26 MED ORDER — ASPIRIN 81 MG PO TBEC: 81.0000 mg | DELAYED_RELEASE_TABLET | Freq: Every day | ORAL | 0 refills | Status: DC

## 2022-02-26 MED ORDER — FUROSEMIDE 40 MG PO TABS: 40.0000 mg | ORAL_TABLET | Freq: Every day | ORAL | 0 refills | Status: DC

## 2022-02-26 MED ORDER — TIMOLOL MALEATE 0.5 % OP SOLN: 1.0000 [drp] | Freq: Every day | OPHTHALMIC | 0 refills | Status: DC

## 2022-02-26 MED ORDER — MELATONIN 5 MG PO TABS: 5.0000 mg | ORAL_TABLET | Freq: Every day | ORAL | 0 refills | Status: DC

## 2022-02-26 MED ORDER — SPIRONOLACTONE 25 MG PO TABS: 12.5000 mg | ORAL_TABLET | Freq: Every day | ORAL | 0 refills | Status: DC

## 2022-02-26 MED ORDER — ATORVASTATIN CALCIUM 80 MG PO TABS: 80.0000 mg | ORAL_TABLET | Freq: Every day | ORAL | 0 refills | Status: DC

## 2022-02-26 MED ORDER — OLANZAPINE 2.5 MG PO TABS: 2.5000 mg | ORAL_TABLET | Freq: Every day | ORAL | 0 refills | Status: DC

## 2022-02-26 MED ORDER — OLANZAPINE 7.5 MG PO TABS: 7.5000 mg | ORAL_TABLET | Freq: Every day | ORAL | 0 refills | Status: DC

## 2022-02-26 MED ORDER — IBUPROFEN 400 MG PO TABS: 400.0000 mg | ORAL_TABLET | Freq: Four times a day (QID) | ORAL | 0 refills | Status: DC | PRN

## 2022-02-26 MED ORDER — ISOSORB DINITRATE-HYDRALAZINE 20-37.5 MG PO TABS: 1.0000 | ORAL_TABLET | Freq: Three times a day (TID) | ORAL | 0 refills | Status: DC

## 2022-02-26 MED ORDER — SERTRALINE HCL 25 MG PO TABS: 75.0000 mg | ORAL_TABLET | Freq: Every day | ORAL | 0 refills | Status: DC

## 2022-02-26 MED ORDER — GABAPENTIN 100 MG PO CAPS: 100.0000 mg | ORAL_CAPSULE | Freq: Three times a day (TID) | ORAL | 0 refills | Status: DC

## 2022-02-26 NOTE — Discharge Summary (Signed)
Physician Discharge Summary Note  Patient:  Matthew [name]. 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   Principal Problem: MDD (major depressive disorder), recurrent, severe, with psychosis ([place]) Discharge Diagnoses: Principal Problem:   MDD (major depressive disorder), recurrent, severe, with psychosis ([place]) Active Problems:   MDD (major depressive disorder), recurrent episode, severe ([place])  Past Psychiatric History: As above  Past Medical History:  Past Medical History:  Diagnosis Date   Acute combined systolic and diastolic CHF, NYHA class 3 ([place])    a. 02/2014 Echo: EF 25-30%.   Cardiomyopathy ([place])    a. 02/2014 Echo: EF 25-30%, sev glob HK with inferolat HK->AK, mod conc LVH, Gr 2 DD, Mild MR, sev dil LA.   Depression    DM (diabetes mellitus) ([place])    High cholesterol    Hypertension    Morbid obesity ([place])    Tobacco abuse     Past Surgical History:  Procedure Laterality Date   CATARACT EXTRACTION Right    LEFT HEART CATHETERIZATION WITH CORONARY ANGIOGRAM N/A 03/04/2014   Procedure: LEFT HEART CATHETERIZATION WITH CORONARY ANGIOGRAM;  Surgeon: Burnell Blanks, MD;  Location: [facility] CATH LAB;  Service: Cardiovascular;  Laterality: N/A;   TEE WITHOUT CARDIOVERSION N/A 02/04/2017   Procedure: TRANSESOPHAGEAL ECHOCARDIOGRAM (TEE);  Surgeon: Dorothy Spark, MD;   Location: [facility] ENDOSCOPY;  Service: Cardiovascular;  Laterality: N/A;   Family History:  Family History  Problem Relation Age of Onset   Heart attack Father        died @ 29   Cervical cancer Mother        died @ 58   Family Psychiatric  History: None provided Social History:  Social History   Substance and Sexual Activity  Alcohol Use No     Social History   Substance and Sexual Activity  Drug Use No    Social History   Socioeconomic History   Marital status: Widowed    Spouse name: Not on file   Number of children: 2   Years of education: 14   Highest education level: Not on file  Occupational History   Occupation: [employer]  Tobacco Use   Smoking status: Former    Years: 20.00    Types: Cigarettes   Smokeless tobacco: Former    Quit date: 11/14/2020  Vaping Use   Vaping Use: Never used  Substance and Sexual Activity   Alcohol use: No   Drug use: No   Sexual activity: Yes  Other Topics Concern   Not on file  Social History Narrative   Lives with 2 young children in [place].  Wife died in May 18, 2020.  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   Left-handed   Caffeine: occasional tea   Social Determinants of Health   Financial Resource Strain: Not on file  Food Insecurity: Not  on file  Transportation Needs: Not on file  Physical Activity: Not on file  Stress: Not on file  Social Connections: Not on file   HOSPITAL COURSE: During the patient's hospitalization, patient had extensive initial psychiatric evaluation, and follow-up psychiatric evaluations every day.  Psychiatric diagnoses provided & medications initiated upon initial assessment were as follows:    MDD recurrent severe with Psychotic Features -Discontinued Prozac due to ineffectiveness and pt's age -Start Zoloft 25 mg daily for depression (start 3/4 @ 0800) -Start Seroquel 50 mg nightly for psychosis (3/3 @ 2200)   Anxiety -Continue Hydroxyzine 25 mg every 6 hours PRN    Medical Medications Continue Lasix 40 mg daily Continue Bidil 20-37.5 mg daily Continue Entresto 49-51 mg daily Continue Aldactone 12.5 mg daily  Continue Timolol eye drops daily Continue Lipitor 80 mg daily Continue Zetia 10 mg daily   Hypokalemia on Lasix - Rechecking BMP and repleting K+ as needed   Other PRNS -Continue Tylenol 650 mg every 6 hours PRN for mild pain -Continue Maalox 30 mg every 4 hrs PRN for indigestion -Continue Milk of Magnesia as needed every 6 hrs for constipation -Discontinue Trazodone '50mg'$  nightly (h/o CHF) -Start Melatonin 5 mg nightly for insomnia   During the hospitalization, other adjustments were made to the patient's psychiatric medication regimens with the final discharge medications being as follows:    MDD recurrent severe with Psychotic Features -Discontinued Prozac on 3/3 due to pt's age and ineffectiveness -Continue Zoloft 75 mg daily for depression  -Discontinue Seroquel 200 mg due to ineffectiveness (02/21/22) -Continue Zyprexa 2.5 mg in the mornings.  -Continue Zyprexa 5 mg nightly for dpn & psychosis -Continue Remeron 7.5 mg nightly for depression, insomnia & appetite stimulation - Continue Melatonin '5mg'$  qhs for insomnia   Anxiety -Continue Hydroxyzine 25 mg every 6 hours PRN   Medical Medications for CHF, hyperlipidemia, Glaucoma, DM, HTN, OSA, h/o CVA Continue Lasix 40 mg daily Continue Bidil 20-37.5 mg daily Continue Entresto 49-51 mg daily Continue Aldactone 12.5 mg daily  Continue Timolol eye drops daily Continue Lipitor 80 mg daily Continue Zetia 10 mg daily Continue ASA '81mg'$  daily   Hypokalemia on Lasix - resolved - K+ 4.2 on 02/19/22   Hyponatremia (Na+ 134 on 02/17/22) - resolved - Monitored with titration up on SSRI - 137 (CMP from 3/6)   Abnormal EKG - Patient currently asymptomatic - EKG on 3/4 with results: Normal sinus rhythm Moderate voltage criteria for LVH, may be normal variant ,T wave abnormality, consider  lateral ischemia. Repeated on 3/6 with same results. Cardiology consulted during this admission due to an episode of lightheadedness & dizziness. Recommended to continue to monitor, with no changes in cardiac medications, and call back if symptoms return. Pt currently asymptomatic, and has been educated to follow up with Cardiology and PCP regarding his medication conditions. He has verbalized understanding to do so.   Patient's care was discussed during the interdisciplinary team meeting every day during the hospitalization. The patient denies having side effects to prescribed psychiatric medication. The patient was evaluated each day by a clinical provider to ascertain response to treatment. Improvement was noted by the patient's report of decreasing symptoms, improved sleep and appetite, affect, medication tolerance, behavior, and participation in unit programming.  Patient was asked each day to complete a self inventory noting mood, mental status, pain, new symptoms, anxiety and concerns.  Symptoms were reported as significantly decreased by this discharge. The patient reports that their mood is stable.  The patient  currently denies suicidal thoughts.  Patient denies having homicidal thoughts.  Patient denies having auditory hallucinations.  Patient denies any visual hallucinations or other symptoms of psychosis, and is verbally contracting for safety outside of this [facility]. The patient was motivated to continue taking medication with a goal of continued improvement in mental health.    The patient reports their target psychiatric symptoms of depression responded well to the psychiatric medications, and the patient reports overall benefit from this psychiatric hospitalization. Supportive psychotherapy was provided to the patient. The patient also participated in regular group therapy while hospitalized. Coping skills, problem solving as well as relaxation therapies were also part  of the unit programming.   Labs were reviewed with the patient, and abnormal results were discussed with the patient who agrees to follow up with his cardiologist and PCP.   Physical Findings: AIMS: Facial and Oral Movements Muscles of Facial Expression: None, normal Lips and Perioral Area: None, normal Jaw: None, normal Tongue: None, normal,Extremity Movements Upper (arms, wrists, hands, fingers): None, normal Lower (legs, knees, ankles, toes): None, normal, Trunk Movements Neck, shoulders, hips: None, normal, Overall Severity Severity of abnormal movements (highest score from questions above): None, normal Incapacitation due to abnormal movements: None, normal Patient's awareness of abnormal movements (rate only patient's report): No Awareness, Dental Status Current problems with teeth and/or dentures?: No Does patient usually wear dentures?: No  CIWA:  n/a COWS:  n/a AIMS: 0  Musculoskeletal: Strength & Muscle Tone: decreased Gait & Station: normal Patient leans: N/A   Psychiatric Specialty Exam:  Presentation  General Appearance: Appropriate for Environment  Eye Contact:Fair  Speech:Clear and Coherent  Speech Volume:Decreased  Handedness:Right   Mood and Affect  Mood:Depressed (depressive symptoms are improving)  Affect:Appropriate   Thought Process  Thought Processes:Coherent  Descriptions of Associations:Intact  Orientation:Full (Time, Place and Person)  Thought Content:Logical  History of Schizophrenia/Schizoaffective disorder:No  Duration of Psychotic Symptoms:No data recorded Hallucinations:Hallucinations: None Description of Command Hallucinations: NA Description of Auditory Hallucinations: Unable to make out what the voices were saying. Description of Visual Hallucinations: NA  Ideas of Reference:None  Suicidal Thoughts:Suicidal Thoughts: No SI Active Intent and/or Plan: Without Intent; Without Plan; Without Means to Carry Out; Without  Access to Means SI Passive Intent and/or Plan: Without Intent; Without Plan; Without Means to Carry Out; Without Access to Means  Homicidal Thoughts:Homicidal Thoughts: No  Sensorium  Memory:Immediate Good; Remote Good; Recent Good  Judgment:Good  Insight:Good  Executive Functions  Concentration:Good  Attention Span:Good  [place] of Knowledge:Good  Language:Good  Psychomotor Activity  Psychomotor Activity:Psychomotor Activity: Normal  Assets  Assets:Communication Skills  Sleep  Sleep:Sleep: Fair Number of Hours of Sleep: 5.5  Physical Exam: Physical Exam Constitutional:      Appearance: Normal appearance.  HENT:     Head: Normocephalic.     Nose: Nose normal. No congestion or rhinorrhea.  Eyes:     Pupils: Pupils are equal, round, and reactive to light.  Pulmonary:     Effort: Pulmonary effort is normal. No respiratory distress.  Musculoskeletal:        General: Normal range of motion.     Cervical back: Normal range of motion. No rigidity.  Neurological:     General: No focal deficit present.     Mental Status: He is alert and oriented to person, place, and time.     Sensory: No sensory deficit.     Coordination: Coordination normal.  Psychiatric:  Thought Content: Thought content normal.   Review of Systems  Constitutional: Negative.  Negative for fever.  HENT: Negative.  Negative for sore throat.   Eyes: Negative.   Respiratory: Negative.  Negative for cough.   Cardiovascular: Negative.  Negative for chest pain.  Gastrointestinal: Negative.  Negative for heartburn and nausea.  Genitourinary: Negative.   Musculoskeletal:  Myalgias: left toe pain, area assessed with no redness/edema/break in skin.  Skin: Negative.  Negative for rash.  Neurological: Negative.   Endo/Heme/Allergies: Negative.   Psychiatric/Behavioral:  Positive for depression (improving with medications).   Blood pressure (!) 138/102, pulse 76, temperature 97.8 F  (36.6 C), temperature source Oral, resp. rate 18, height '5\' 9"'$  (1.753 m), weight 85.7 kg, SpO2 100 %. Body mass index is 27.91 kg/m.-Blood pressures are chronically elevated, medicine was consulted during this hospitalization with no changes in medications. Pt is currently asymptomatic.  Social History   Tobacco Use  Smoking Status Former   Years: 20.00   Types: Cigarettes  Smokeless Tobacco Former   Quit date: 11/14/2020   Tobacco Cessation:  N/A, patient does not currently use tobacco products   Blood Alcohol level:  Lab Results  Component Value Date   ETH <10 44/31/5400   Metabolic Disorder Labs:  Lab Results  Component Value Date   HGBA1C 5.8 12/05/2021   MPG 111.15 12/27/2020   MPG 142.72 07/04/2020   Lab Results  Component Value Date   PROLACTIN 4.8 02/14/2022   Lab Results  Component Value Date   CHOL 166 02/14/2022   TRIG 57 02/14/2022   HDL 54 02/14/2022   CHOLHDL 3.1 02/14/2022   VLDL 11 02/14/2022   LDLCALC 101 (H) 02/14/2022   LDLCALC 40 09/15/2021   See Psychiatric Specialty Exam and Suicide Risk Assessment completed by Attending Physician prior to discharge.  Discharge destination:  Other:  [facility], [place], [place]  Is patient on multiple antipsychotic therapies at discharge:  No   Has Patient had three or more failed trials of antipsychotic monotherapy by history:  No  Recommended Plan for Multiple Antipsychotic Therapies: NA  Discharge Instructions     [facility] pharmacy consult for medication samples   Complete by: As directed    Pharmacist, please supply patient with a 7 days supply of medication samples. He has no insurance coverage      Allergies as of 02/26/2022   No Known Allergies      Medication List     STOP taking these medications    FLUoxetine 40 MG capsule Commonly known as: PROZAC       TAKE these medications      Indication  aspirin 81 MG EC tablet Take 1 tablet (81 mg total) by mouth daily. Swallow  whole. Start taking on: February 27, 2022  Indication: Disease involving a Thrombosis or an Embolism   atorvastatin 80 MG tablet Commonly known as: LIPITOR Take 1 tablet (80 mg total) by mouth daily. Start taking on: February 27, 2022 What changed: how much to take  Indication: High Amount of Fats in the Blood   ezetimibe 10 MG tablet Commonly known as: ZETIA Take 1 tablet (10 mg total) by mouth daily.  Indication: High Amount of Fats in the Blood   furosemide 40 MG tablet Commonly known as: Lasix Take 1 tablet (40 mg total) by mouth daily.  Indication: Cardiac Failure   gabapentin 100 MG capsule Commonly known as: NEURONTIN Take 1 capsule (100 mg total) by mouth 3 (three) times daily.  Indication: Neuropathic Pain   hydrOXYzine 25 MG tablet Commonly known as: ATARAX Take 1 tablet (25 mg total) by mouth 3 (three) times daily as needed for anxiety.  Indication: Feeling Anxious   ibuprofen 400 MG tablet Commonly known as: ADVIL Take 1 tablet (400 mg total) by mouth every 6 (six) hours as needed for moderate pain.  Indication: Rheumatoid Arthritis   isosorbide-hydrALAZINE 20-37.5 MG tablet Commonly known as: BIDIL Take 1 tablet by mouth 3 (three) times daily.  Indication: Cardiac Failure   melatonin 5 MG Tabs Take 1 tablet (5 mg total) by mouth at bedtime.  Indication: Trouble Sleeping   OLANZapine 7.5 MG tablet Commonly known as: ZYPREXA Take 1 tablet (7.5 mg total) by mouth at bedtime.  Indication: Major Depressive Disorder   OLANZapine 2.5 MG tablet Commonly known as: ZYPREXA Take 1 tablet (2.5 mg total) by mouth daily. Start taking on: February 27, 2022  Indication: Major Depressive Disorder   sacubitril-valsartan 49-51 MG Commonly known as: ENTRESTO Take 1 tablet by mouth 2 (two) times daily.  Indication: Cardiac Failure   sertraline 25 MG tablet Commonly known as: ZOLOFT Take 3 tablets (75 mg total) by mouth daily. Start taking on: February 27, 2022   Indication: Major Depressive Disorder   spironolactone 25 MG tablet Commonly known as: ALDACTONE Take 0.5 tablets (12.5 mg total) by mouth daily. Start taking on: February 27, 2022  Indication: High Blood Pressure Disorder   timolol 0.5 % ophthalmic solution Commonly known as: TIMOPTIC Place 1 drop into both eyes daily after breakfast.  Indication: Angle-Closure Glaucoma        Follow-up [place]. Go to.   Specialty: Behavioral Health Why: Please go to this provider for necessary therapy and/or medication management services during walk in hours:  Monday through Wednesday from 7:30 am to 10:30 am.  Services are provided on a first come, first served basis. Contact information: [place], [facility]. Go on 03/05/2022.   Specialty: Professional Counselor Why: You have an appointment for therapy services on 03/05/22 at 12:00 pm with [name].  This appointment will be held in person. Contact information: [place] [phone]         [facility]. Go to.   Why: You have a hospital follow up appointment Tuesday, 02/27/2022 at 8am for med management and therapy services. Contact information: [address],  [place] ,  [place], [place] [address]               Follow-up recommendations:   The patient is able to verbalize their individual safety plan to this provider.   # It is recommended to the patient to continue psychiatric medications as prescribed, after discharge from the hospital.     # It is recommended to the patient to follow up with your outpatient psychiatric provider and PCP.   # It was discussed with the patient, the impact of alcohol, drugs, tobacco have been there overall psychiatric and medical wellbeing, and total abstinence from substance use was recommended the  patient.ed.   # Prescriptions provided or sent directly to preferred pharmacy at discharge. Patient agreeable to plan. Given opportunity to ask questions. Appears to feel comfortable with discharge.    # In the event of worsening symptoms, the patient is instructed to call the crisis hotline, 911 and or  go to the nearest ED for appropriate evaluation and treatment of symptoms. To follow-up with primary care provider for other medical issues, concerns and or health care needs   # Patient was discharged to [facility] in [place] with a plan to follow up as noted below.     Signed: Nicholes Rough, NP 02/26/2022, 12:20 PM

## 2022-02-26 NOTE — BHH Suicide Risk Assessment (Signed)
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   HOSPITAL COURSE: During the patient's hospitalization, patient had extensive initial psychiatric evaluation, and follow-up psychiatric evaluations every day.  Psychiatric diagnoses provided & medications initiated upon initial assessment were as follows:   MDD recurrent severe with Psychotic Features -Discontinued Prozac due to ineffectiveness and pt's age -Start Zoloft 25 mg daily for depression (start 3/4 @ 0800) -Start Seroquel 50 mg nightly for psychosis (3/3 @ 2200)   Anxiety -Continue Hydroxyzine 25 mg every 6 hours PRN   Medical Medications Continue Lasix 40 mg daily Continue Bidil 20-37.5 mg daily Continue Entresto 49-51 mg daily Continue Aldactone 12.5 mg daily  Continue Timolol eye drops daily Continue Lipitor 80 mg daily Continue Zetia 10 mg daily   Hypokalemia on Lasix - Rechecking BMP and repleting K+ as needed   Other PRNS -Continue Tylenol 650 mg every 6 hours PRN for mild pain -Continue Maalox 30 mg every 4 hrs PRN for indigestion -Continue Milk of Magnesia as needed every 6 hrs for constipation -Discontinue Trazodone '50mg'$  nightly (h/o CHF) -Start Melatonin 5 mg nightly for insomnia  During  the hospitalization, other adjustments were made to the patient's psychiatric medication regimens with the final discharge medications being as follows:   MDD recurrent severe with Psychotic Features -Discontinued Prozac on 3/3 due to pt's age and ineffectiveness -Continue Zoloft 75 mg daily for depression  -Discontinue Seroquel 200 mg due to ineffectiveness (02/21/22) -Continue Zyprexa 2.5 mg in the mornings.  -Continue Zyprexa 5 mg nightly for dpn & psychosis -Continue Remeron 7.5 mg nightly for depression, insomnia & appetite stimulation - Continue Melatonin '5mg'$  qhs for insomnia   Anxiety -Continue Hydroxyzine 25 mg every 6 hours PRN   Medical Medications for CHF, hyperlipidemia, Glaucoma, DM, HTN, OSA, h/o CVA Continue Lasix 40 mg daily Continue Bidil 20-37.5 mg daily Continue Entresto 49-51 mg daily Continue Aldactone 12.5 mg daily  Continue Timolol eye drops daily Continue Lipitor 80 mg daily Continue Zetia 10 mg daily Continue ASA '81mg'$  daily   Hypokalemia on Lasix - resolved - K+ 4.2 on 02/19/22   Hyponatremia (Na+ 134 on 02/17/22) - resolved - Monitored with titration up on SSRI - 137 (CMP from 3/6)   Abnormal EKG - Patient currently asymptomatic - EKG on 3/4 with results: Normal sinus rhythm Moderate voltage criteria for LVH, may be normal variant ,T wave abnormality, consider lateral ischemia. Repeated on 3/6 with same results. Cardiology consulted during this admission due to an episode of lightheadedness & dizziness. Recommended to continue to monitor, with no changes in cardiac medications, and call back if symptoms return. Pt currently asymptomatic, and has been educated to follow up with Cardiology and PCP regarding his medication conditions. He has verbalized understanding to do so.  Patient's care was discussed during the interdisciplinary team meeting every day  during the hospitalization. The patient denies having side effects to prescribed psychiatric medication. The  patient was evaluated each day by a clinical provider to ascertain response to treatment. Improvement was noted by the patient's report of decreasing symptoms, improved sleep and appetite, affect, medication tolerance, behavior, and participation in unit programming.  Patient was asked each day to complete a self inventory noting mood, mental status, pain, new symptoms, anxiety and concerns.  Symptoms were reported as significantly decreased by this discharge. The patient reports that their mood is stable.  The patient currently denies suicidal thoughts.  Patient denies having homicidal thoughts.  Patient denies having auditory hallucinations.  Patient denies any visual hallucinations or other symptoms of psychosis, and is verbally contracting for safety outside of this [facility]. The patient was motivated to continue taking medication with a goal of continued improvement in mental health.   The patient reports their target psychiatric symptoms of depression responded well to the psychiatric medications, and the patient reports overall benefit from this psychiatric hospitalization. Supportive psychotherapy was provided to the patient. The patient also participated in regular group therapy while hospitalized. Coping skills, problem solving as well as relaxation therapies were also part of the unit programming.  Labs were reviewed with the patient, and abnormal results were discussed with the patient who agrees to follow up with his cardiologist and PCP.  Total Time spent with patient: 30 minutes  Musculoskeletal: Strength & Muscle Tone: within normal limits Gait & Station: unsteady Patient leans: N/A  Psychiatric Specialty Exam  Presentation  General Appearance: Appropriate for Environment  Eye Contact:Fair  Speech:Clear and Coherent  Speech Volume:Decreased  Handedness:Right  Mood and Affect  Mood:Depressed (depressive symptoms are improving)  Duration of  Depression Symptoms: No data recorded Affect:Appropriate  Thought Process  Thought Processes:Coherent  Descriptions of Associations:Intact  Orientation:Full (Time, Place and Person)  Thought Content:Logical  History of Schizophrenia/Schizoaffective disorder:No  Duration of Psychotic Symptoms:No data recorded Hallucinations:Hallucinations: None Description of Command Hallucinations: NA Description of Auditory Hallucinations: Unable to make out what the voices were saying. Description of Visual Hallucinations: NA  Ideas of Reference:None  Suicidal Thoughts:Suicidal Thoughts: No SI Active Intent and/or Plan: Without Intent; Without Plan; Without Means to Carry Out; Without Access to Means SI Passive Intent and/or Plan: Without Intent; Without Plan; Without Means to Carry Out; Without Access to Means  Homicidal Thoughts:Homicidal Thoughts: No  Sensorium  Memory:Immediate Good; Remote Good; Recent Good  Judgment:Good  Insight:Good  Executive Functions  Concentration:Good  Attention Span:Good  [place] of Knowledge:Good  Language:Good  Psychomotor Activity  Psychomotor Activity:Psychomotor Activity: Normal  Assets  Assets:Communication Skills  Sleep  Sleep:Sleep: Fair Number of Hours of Sleep: 5.5  Physical Exam: Physical Exam Constitutional:      Appearance: Normal appearance.  HENT:     Head: Normocephalic.     Nose: No congestion or rhinorrhea.  Eyes:     Pupils: Pupils are equal, round, and reactive to light.  Pulmonary:     Effort: Pulmonary effort is normal. No respiratory distress.  Musculoskeletal:        General: Normal range of motion.     Cervical back: Normal range of motion. No rigidity.  Neurological:     Mental Status: He is alert and oriented to person, place, and time.     Sensory: No sensory deficit.     Coordination: Coordination normal.  Psychiatric:        Behavior: Behavior normal.  Thought Content: Thought  content normal.   Review of Systems  Constitutional: Negative.  Negative for fever.  HENT: Negative.  Negative for sore throat.   Eyes: Negative.   Respiratory: Negative.  Negative for cough.   Cardiovascular: Negative.  Negative for chest pain.  Gastrointestinal:  Negative for heartburn and nausea.  Genitourinary: Negative.   Musculoskeletal:  Myalgias: complains of Left toe pain, states that Ibuprofen helps. Area assessed and there is no break in skin in the area, no discoloration, no edema.  Skin: Negative.   Neurological: Negative.   Endo/Heme/Allergies: Negative.   Psychiatric/Behavioral:  Positive for depression (improving with medications).   Blood pressure (!) 138/102, pulse 76, temperature 97.8 F (36.6 C), temperature source Oral, resp. rate 18, height '5\' 9"'$  (1.753 m), weight 85.7 kg, SpO2 100 %. Body mass index is 27.91 kg/m.  Mental Status Per Nursing Assessment::   On Admission:  Self-harm thoughts  Demographic Factors:  Male  Loss Factors: Financial problems/change in socioeconomic status  Historical Factors: Homelessness  Risk Reduction Factors:   NA  Continued Clinical Symptoms:  Pt reports that their depressive symptoms are resolving. He denies SI/HI/AVH and denies paranoia this morning.  Cognitive Features That Contribute To Risk:  None    Suicide Risk:  Minimal: No identifiable suicidal ideation.  Patients presenting with no risk factors but with morbid ruminations; may be classified as minimal risk based on the severity of the depressive symptoms   Follow-up [facility]. Go to.   Specialty: Behavioral Health Why: Please go to this provider for necessary therapy and/or medication management services during walk in hours:  Monday through Wednesday from 7:30 am to 10:30 am.  Services are provided on a first come, first served basis. Contact information: [place]. Go on 03/05/2022.   Specialty: Professional Counselor Why: You have an appointment for therapy services on 03/05/22 at 12:00 pm with [name].  This appointment will be held in person. Contact information: [place] [address] [phone]         [facility]. Go to.   Why: You have a hospital follow up appointment Tuesday, 02/27/2022 at 8am for med management and therapy services. Contact information: [address]  [place] ,  [place], [place] [address]               Plan Of Care/Follow-up recommendations:  The patient is able to verbalize their individual safety plan to this provider.  # It is recommended to the patient to continue psychiatric medications as prescribed, after discharge from the hospital.    # It is recommended to the patient to follow up with your outpatient psychiatric provider and PCP.  # It was discussed with the patient, the impact of alcohol, drugs, tobacco have been there overall psychiatric and medical wellbeing, and total abstinence from substance use was recommended the patient.ed.  # Prescriptions provided or sent directly to preferred pharmacy at discharge. Patient agreeable to plan. Given opportunity to ask questions. Appears to feel comfortable with discharge.    # In the event of worsening symptoms, the patient is instructed to call the crisis hotline, 911 and or go to the nearest ED for appropriate evaluation and treatment of symptoms. To follow-up with primary care provider for other medical issues, concerns and or health care needs  #  Patient was discharged to [facility] in [place] with a plan to follow up as noted below.    Nicholes Rough, NP 02/26/2022, 12:08 PM

## 2022-02-26 NOTE — Progress Notes (Signed)
?  [facility] Adult Case Management Discharge Plan : ? ?Will you be returning to the same living situation after discharge:  No. Patient will be going to [facility]  ?At discharge, do you have transportation home?: Yes,  patient will utilize taxi provided by the hospital. ?Do you have the ability to pay for your medications: Yes,  provided resources for affordable after care follow up. ? ?Release of information consent forms completed and in the chart;  Patient's signature needed at discharge. ? ?Patient to Follow up at: ? Follow-up Information   ? ? [place]. Go to.   ?Specialty: Behavioral Health ?Why: Please go to this provider for necessary therapy and/or medication management services during walk in hours:  Monday through Wednesday from 7:30 am to 10:30 am.  Services are provided on a first come, first served basis. ?Contact information: ?[address] ?[place] [address] ?[phone] ? ?  ?  ? ? [place], [facility]. Go on 03/05/2022.   ?Specialty: Professional Counselor ?Why: You have an appointment for therapy services on 03/05/22 at 12:00 pm with [name].  This appointment will be held in person. ?Contact information: ?[place] ?[place] [address] ?[phone] ? ? ?  ?  ? ? [facility]. Go to.   ?Why: You have a hospital follow up appointment Tuesday, 02/27/2022 at 8am for med management and therapy services. ?Contact information: ?[address],  ?[address] ,  ?[place], [place] [address] ? ?  ?  ? ?  ?  ? ?  ? ? ?Next level of care provider has access to [facility] ? ?Safety Planning and Suicide Prevention discussed: Yes,  with patient and with patient support. ? ?  ? ?Has patient been referred to the Quitline?: N/A patient is not a smoker ? ?Patient has been referred for addiction treatment: N/A ? ?Matthew Trotter E Malak Orantes, LCSW ?02/26/2022, 10:43 AM ?

## 2022-02-26 NOTE — BHH Group Notes (Signed)
Spiritual care group on grief and loss facilitated by chaplains Amy Burris, [facility] and Janne Napoleon, [facility]  ? ?Group Goal:  ?Support / Education around grief and loss  ? ?Members engage in facilitated group support and psycho-social education.  ? ?Group Description:  ?Spiritual care group on grief and loss facilitated by chaplain Janne Napoleon, [facility]  ? ?Group Goal:  ? ?Support / Education around grief and loss  ? ?Members engage in facilitated group support and psycho-social education.  ? ?Group Description:  ? ?Following introductions and group rules, group members engaged in facilitated group dialog and support around topic of loss, with particular support around experiences of loss in their lives. Group Identified types of loss (relationships / self / things) and identified patterns, circumstances, and changes that precipitate losses. Reflected on thoughts / feelings around loss, normalized grief responses, and recognized variety in grief experience. Group noted Worden's four tasks of grief in discussion.  ? ?Group drew on Adlerian / Rogerian, narrative, MI,  ? ?Patient Progress: Pt attended group; participation was mostly passive. Near end Pt opened up and showed more active engagement; also commented that medication was having an impact on ability to be alert. ?

## 2022-02-26 NOTE — BH IP Treatment Plan (Signed)
Interdisciplinary Treatment and Diagnostic Plan Update  02/26/2022 Time of Session: 10:05am [name] MRN: [id number]  Principal Diagnosis: MDD (major depressive disorder), recurrent, severe, with psychosis ([place])  Secondary Diagnoses: Principal Problem:   MDD (major depressive disorder), recurrent, severe, with psychosis ([place]) Active Problems:   MDD (major depressive disorder), recurrent episode, severe ([place])   Current Medications:  Current Facility-Administered Medications  Medication Dose Route Frequency Provider Last Rate Last Admin   acetaminophen (TYLENOL) tablet 650 mg  650 mg Oral Q6H PRN Lucky Rathke, FNP   650 mg at 02/25/22 1710   alum & mag hydroxide-simeth (MAALOX/MYLANTA) 200-200-20 MG/5ML suspension 30 mL  30 mL Oral Q4H PRN Lucky Rathke, FNP       aspirin EC tablet 81 mg  81 mg Oral Daily Lucky Rathke, FNP   81 mg at 02/26/22 0900   atorvastatin (LIPITOR) tablet 80 mg  80 mg Oral Daily Lucky Rathke, FNP   80 mg at 02/26/22 0900   ezetimibe (ZETIA) tablet 10 mg  10 mg Oral Daily Lucky Rathke, FNP   10 mg at 02/26/22 0900   furosemide (LASIX) tablet 40 mg  40 mg Oral Daily Lucky Rathke, FNP   40 mg at 02/26/22 0900   gabapentin (NEURONTIN) capsule 100 mg  100 mg Oral TID Nicholes Rough, NP   100 mg at 02/26/22 0900   hydrocerin (EUCERIN) cream   Topical BID Nicholes Rough, NP   Given at 02/26/22 0900   hydrOXYzine (ATARAX) tablet 25 mg  25 mg Oral TID PRN Lucky Rathke, FNP   25 mg at 02/25/22 2122   ibuprofen (ADVIL) tablet 400 mg  400 mg Oral Q6H PRN Nicholes Rough, NP   400 mg at 02/25/22 2122   isosorbide-hydrALAZINE (BIDIL) 20-37.5 MG per tablet 1 tablet  1 tablet Oral TID Minda Ditto, RPH   1 tablet at 02/26/22 0900   magnesium hydroxide (MILK OF MAGNESIA) suspension 30 mL  30 mL Oral Daily PRN Lucky Rathke, FNP       melatonin tablet 5 mg  5 mg Oral QHS Nkwenti, Doris, NP   5 mg at 02/25/22 2123   OLANZapine (ZYPREXA) tablet 2.5 mg  2.5 mg Oral  Daily Nkwenti, Doris, NP   2.5 mg at 02/26/22 0900   OLANZapine (ZYPREXA) tablet 7.5 mg  7.5 mg Oral QHS Nkwenti, Doris, NP   7.5 mg at 02/25/22 2122   sacubitril-valsartan (ENTRESTO) 49-51 mg per tablet  1 tablet Oral BID Lucky Rathke, FNP   1 tablet at 02/26/22 0900   sertraline (ZOLOFT) tablet 75 mg  75 mg Oral Daily Nkwenti, Tamela Oddi, NP   75 mg at 02/26/22 0900   spironolactone (ALDACTONE) tablet 12.5 mg  12.5 mg Oral Daily Beatriz Stallion L, FNP   12.5 mg at 02/26/22 0900   timolol (TIMOPTIC) 0.5 % ophthalmic solution 1 drop  1 drop Both Eyes QPC breakfast Lucky Rathke, FNP   1 drop at 02/26/22 8101   PTA Medications: Medications Prior to Admission  Medication Sig Dispense Refill Last Dose   aspirin EC 81 MG tablet Take 1 tablet (81 mg total) by mouth daily. Swallow whole. (Patient not taking: Reported on 11/17/2021) 30 tablet 11    atorvastatin (LIPITOR) 80 MG tablet TAKE 1 TABLET BY MOUTH DAILY. 30 tablet 3    ezetimibe (ZETIA) 10 MG tablet Take 1 tablet (10 mg total) by mouth daily. 30 tablet 3  FLUoxetine (PROZAC) 40 MG capsule Take 1 capsule (40 mg total) by mouth daily. 30 capsule 5    furosemide (LASIX) 40 MG tablet Take 1 tablet (40 mg total) by mouth daily. 30 tablet 3    hydrOXYzine (ATARAX) 25 MG tablet Take 1 tablet (25 mg total) by mouth 3 (three) times daily as needed for anxiety. 90 tablet 2    isosorbide-hydrALAZINE (BIDIL) 20-37.5 MG tablet Take 1 tablet by mouth 3 (three) times daily. 90 tablet 11    sacubitril-valsartan (ENTRESTO) 49-51 MG Take 1 tablet by mouth 2 (two) times daily. 60 tablet 1    spironolactone (ALDACTONE) 25 MG tablet Take 0.5 tablets (12.5 mg total) by mouth daily. 45 tablet 3    timolol (TIMOPTIC) 0.5 % ophthalmic solution Place 1 drop into both eyes daily after breakfast.       Patient Stressors:    Patient Strengths:    Treatment Modalities: Medication Management, Group therapy, Case management,  1 to 1 session with clinician, Psychoeducation,  Recreational therapy.   Physician Treatment Plan for Primary Diagnosis: MDD (major depressive disorder), recurrent, severe, with psychosis ([place]) Long Term Goal(s): Improvement in symptoms so as ready for discharge   Short Term Goals: Ability to identify changes in lifestyle to reduce recurrence of condition will improve Ability to verbalize feelings will improve Ability to disclose and discuss suicidal ideas Ability to demonstrate self-control will improve Ability to identify and develop effective coping behaviors will improve Compliance with prescribed medications will improve Ability to identify triggers associated with substance abuse/mental health issues will improve  Medication Management: Evaluate patient's response, side effects, and tolerance of medication regimen.  Therapeutic Interventions: 1 to 1 sessions, Unit Group sessions and Medication administration.  Evaluation of Outcomes: Adequate for Discharge  Physician Treatment Plan for Secondary Diagnosis: Principal Problem:   MDD (major depressive disorder), recurrent, severe, with psychosis ([place]) Active Problems:   MDD (major depressive disorder), recurrent episode, severe ([place])  Long Term Goal(s): Improvement in symptoms so as ready for discharge   Short Term Goals: Ability to identify changes in lifestyle to reduce recurrence of condition will improve Ability to verbalize feelings will improve Ability to disclose and discuss suicidal ideas Ability to demonstrate self-control will improve Ability to identify and develop effective coping behaviors will improve Compliance with prescribed medications will improve Ability to identify triggers associated with substance abuse/mental health issues will improve     Medication Management: Evaluate patient's response, side effects, and tolerance of medication regimen.  Therapeutic Interventions: 1 to 1 sessions, Unit Group sessions and Medication administration.  Evaluation of  Outcomes: Adequate for Discharge   RN Treatment Plan for Primary Diagnosis: MDD (major depressive disorder), recurrent, severe, with psychosis ([place]) Long Term Goal(s): Knowledge of disease and therapeutic regimen to maintain health will improve  Short Term Goals: Ability to remain free from injury will improve, Ability to verbalize frustration and anger appropriately will improve, Ability to demonstrate self-control, Ability to identify and develop effective coping behaviors will improve, and Compliance with prescribed medications will improve  Medication Management: RN will administer medications as ordered by provider, will assess and evaluate patient's response and provide education to patient for prescribed medication. RN will report any adverse and/or side effects to prescribing provider.  Therapeutic Interventions: 1 on 1 counseling sessions, Psychoeducation, Medication administration, Evaluate responses to treatment, Monitor vital signs and CBGs as ordered, Perform/monitor CIWA, COWS, AIMS and Fall Risk screenings as ordered, Perform wound care treatments as ordered.  Evaluation of Outcomes: Adequate  for Discharge   LCSW Treatment Plan for Primary Diagnosis: MDD (major depressive disorder), recurrent, severe, with psychosis ([place]) Long Term Goal(s): Safe transition to appropriate next level of care at discharge, Engage patient in therapeutic group addressing interpersonal concerns.  Short Term Goals: Engage patient in aftercare planning with referrals and resources, Increase social support, Increase ability to appropriately verbalize feelings, Identify triggers associated with mental health/substance abuse issues, and Increase skills for wellness and recovery  Therapeutic Interventions: Assess for all discharge needs, 1 to 1 time with Social worker, Explore available resources and support systems, Assess for adequacy in community support network, Educate family and significant other(s) on  suicide prevention, Complete Psychosocial Assessment, Interpersonal group therapy.  Evaluation of Outcomes: Adequate for Discharge   Progress in Treatment: Attending groups: Yes. Participating in groups: Yes. Taking medication as prescribed: Yes. Toleration medication: Yes. Family/Significant other contact made: Yes, individual(s) contacted:  Mother in Law Patient understands diagnosis: Yes. Discussing patient identified problems/goals with staff: Yes. Medical problems stabilized or resolved: Yes. Denies suicidal/homicidal ideation: Yes. Issues/concerns per patient self-inventory: Yes. Other: none   New problem(s) identified: No, Describe:  no new problems identified    New Short Term/Long Term Goal(s):    medication stabilization, elimination of SI thoughts, development of comprehensive mental wellness plan.      Patient Goals:  "get better"   Discharge Plan or Barriers: Patient is to discharge to a shelter and follow up with [facility]   Reason for Continuation of Hospitalization:  Medication stabilization    Estimated Length of Stay: Adequate for discharge   Scribe for Treatment Team: Vassie Moselle, LCSW 02/26/2022 10:15 AM

## 2022-02-26 NOTE — Plan of Care (Signed)
Nurse discussed anxiety, depression and coping skills with patient.  

## 2022-02-26 NOTE — Group Note (Signed)
[facility] LCSW Group Therapy Note ? ?Date/Time: 05/19/15 at 3:00pm ? ?Type of Therapy and Topic:  Group Therapy:  Strengths and Qualities ? ?Participation Level:  Active ? ?Description of Group:   ? In this group patients will be asked to explore and define the terms strength ans qualities.  Patients will be guided to discuss their thoughts, feelings, and behaviors as to where strengths and qualities originate. Participants will then list some of their strengths and qualities related to each subject topic. This group will be process-oriented, with patients participating in exploration of their own experiences as well as giving and receiving support and challenge from other group members. ? ?Therapeutic Goals: ?Patient will identify specific strengths related to their personal life. ?Patient will identify feelings, thoughts, and beliefs about strengths and qualities. ?Patient will identify ways their strengths have been used. Matthew [name] ?Patient will identify situations where they have helped others or made someone else happy. . ? ?Summary of Patient Progress ?Patient was present for the entirety of the group session. Patient was an active listener and participated in the topic of discussion, provided helpful advice to others, and added nuance to topic of conversation. Patient shared his strengths are being friendly and listening to others. Patient provided positive affirmations to others in the group.  ? ? ? ? ?Therapeutic Modalities:   ?Cognitive Behavioral Therapy ?Solution Focused Therapy ?Motivational Interviewing ?Brief Therapy ?

## 2022-02-26 NOTE — Progress Notes (Signed)
D:  Patient's self inventory sheet, patient sleeps good, sleep medication helpful.  Good appetite, normal energy level, poor concentration.  Rated depression and        anxiety 8, hopeless 9.  Denied withdrawals, .  SI, contracts for safety.  Physical problems, lightheaded, dizzy, headaches, blurred vision.  Physical pain, Toe and headache, worst pain #7.  Goal is stay positive.  Have positive thoughts.  No discharge plans. ?A:  Medications administered per MD orders.  Emotional support and encouragement given patient. ?R:  Denied HI.  Denied A/V hallucinations.  Safety maintained with 15 minute checks. ? ?

## 2022-02-26 NOTE — Plan of Care (Signed)
Nurse discussed coping skills with patient.  

## 2022-02-26 NOTE — Progress Notes (Signed)
Adult Psychoeducational Group Note ? ?Date:  02/26/2022 ?Time:  11:19 AM ? ?Group Topic/Focus:  ?Goals Group:   The focus of this group is to help patients establish daily goals to achieve during treatment and discuss how the patient can incorporate goal setting into their daily lives to aide in recovery. ? ?Participation Level:  Active ? ?Participation Quality:  Appropriate ? ?Affect:  Appropriate ? ?Cognitive:  Appropriate ? ?Insight: Appropriate ? ?Engagement in Group:  Engaged ? ?Modes of Intervention:  Discussion ? ?Additional Comments:  Patient attended morning orientation/goal's group and said that his goal for today is to think positive.  ? ?Giorgi Debruin W Molinda Bailiff ?03/25/8118, 11:19 AM ?

## 2022-02-26 NOTE — Group Note (Signed)
Recreation Therapy Group Note ? ? ?Group Topic:Stress Management  ?Group Date: 02/26/2022 ?Start Time: 0930 ?End Time: 0950 ?Facilitators: Victorino Sparrow, LRT,CTRS ?Location: [facility] ? ? ?Goal Area(s) Addresses:  ?Patient will identify positive stress management techniques. ?Patient will identify benefits of using stress management post d/c. ?  ?Group Description:  Meditation.  LRT played a meditation that focused on centering yourself to start the day off and prepare for what the day has in store.  Patients were to follow along with the meditation as it played to fully engage with the meditation. ? ? ?Affect/Mood: Appropriate ?  ?Participation Level: Active ?  ?Participation Quality: Independent ?  ?Behavior: Attentive  ?  ?Speech/Thought Process: Focused ?  ?Insight: Good ?  ?Judgement: Good ?  ?Modes of Intervention: Meditation ?  ?Patient Response to Interventions:  Engaged ?  ?Education Outcome: ? Acknowledges education and In group clarification offered   ? ?Clinical Observations/Individualized Feedback: Pt attended and participated in meditation activity.  ?  ? ?Plan: Continue to engage patient in RT group sessions 2-3x/week. ? ? ?Victorino Sparrow, LRT, CTRS ?02/26/2022 11:54 AM ?

## 2022-02-26 NOTE — Progress Notes (Signed)
Discharge Note:  Patient discharged via taxi to [facility]  Suicide information given and discussed with patient who stated he understood and had no questions.  Patient denied SI and HI.  Denied A/V hallucination.  Patient stated he appreciated all assistance received from [facility] staff.  Patient stated he received all his belongings, clothing, toiletries, misc items.  All required discharge information given. ? ?

## 2022-02-28 ENCOUNTER — Telehealth (HOSPITAL_COMMUNITY): Payer: Self-pay | Admitting: Licensed Clinical Social Worker

## 2022-02-28 NOTE — Telephone Encounter (Signed)
CSW received call from pt to check in.  He is now Matthew [name] from [facility] and is at a shelter in [place].  He reports he is no longer suicidal and was struggling surrounding the anniversary of his wife's death. ? ?Pt request help with getting medical records to his disability lawyer- see we have records request on file so sent in our records but explained he would have to contact [facility] Records department regarding any other [facility] system notes they need- pt expressed understanding ? ?Jorge Ny, LCSW ?Clinical Social Worker ?[facility] ?Desk#: [phone] ?Cell#: [phone] ? ?

## 2022-03-12 ENCOUNTER — Other Ambulatory Visit: Payer: Self-pay

## 2022-03-12 ENCOUNTER — Other Ambulatory Visit (HOSPITAL_COMMUNITY): Payer: Self-pay | Admitting: Family Medicine

## 2022-03-12 NOTE — Telephone Encounter (Signed)
Patient called in to inform Dr Margarita Rana that he is out of his medication isosorbide-hydrALAZINE (BIDIL) 20-37.5 MG tablet , furosemide (LASIX) 40 MG tablet,  atorvastatin (LIPITOR) 80 MG tablet , Ph# [phone] ?

## 2022-03-14 ENCOUNTER — Other Ambulatory Visit: Payer: Self-pay

## 2022-03-14 MED ORDER — SPIRONOLACTONE 25 MG PO TABS
12.5000 mg | ORAL_TABLET | Freq: Every day | ORAL | 0 refills | Status: DC
  Filled 2022-03-14: qty 15, 30d supply, fill #0

## 2022-03-14 MED ORDER — FUROSEMIDE 40 MG PO TABS
40.0000 mg | ORAL_TABLET | Freq: Every day | ORAL | 0 refills | Status: DC
  Filled 2022-03-14: qty 30, 30d supply, fill #0

## 2022-03-14 NOTE — Telephone Encounter (Signed)
? ?  Future visit scheduled: no, seen 01/10/22 ? ?Notes to clinic:  Prescriber not in this practice, Aldactone says was discontinued but it is on current med list. Please assess. ? ? ?  ? ?Requested Prescriptions  ?Pending Prescriptions Disp Refills  ? spironolactone (ALDACTONE) 25 MG tablet 45 tablet 3  ?  Sig: Take 0.5 tablets (12.5 mg total) by mouth daily.  ?  ? There is no refill protocol information for this order  ?  ? furosemide (LASIX) 40 MG tablet 30 tablet 0  ?  Sig: Take 1 tablet (40 mg total) by mouth daily.  ?  ? There is no refill protocol information for this order  ?  ? ? ?

## 2022-03-15 ENCOUNTER — Other Ambulatory Visit: Payer: Self-pay | Admitting: Family Medicine

## 2022-03-15 ENCOUNTER — Telehealth (HOSPITAL_COMMUNITY): Payer: Self-pay | Admitting: Licensed Clinical Social Worker

## 2022-03-15 ENCOUNTER — Other Ambulatory Visit: Payer: Self-pay

## 2022-03-15 ENCOUNTER — Other Ambulatory Visit (HOSPITAL_COMMUNITY): Payer: Self-pay | Admitting: Family Medicine

## 2022-03-15 ENCOUNTER — Telehealth: Payer: Self-pay

## 2022-03-15 ENCOUNTER — Other Ambulatory Visit: Payer: Self-pay | Admitting: Internal Medicine

## 2022-03-15 MED ORDER — ENTRESTO 49-51 MG PO TABS
ORAL_TABLET | ORAL | 0 refills | Status: DC
  Filled 2022-03-15 – 2022-03-16 (×2): qty 60, 30d supply, fill #0

## 2022-03-15 NOTE — Telephone Encounter (Signed)
CSW received call from pt requesting that I speak with a caseworker about housing.   ? ?CSW spoke with Frankey Poot [phone]) a caseworker with [facility] program through [facility].  States he is trying to evaluate patient for potential housing voucher based on his having debilitating mental illness that could lead to him being in group home or adult care home.  Frankey Poot needs documentation showing pt mental health diagnosis as well as an fl2 in order to proceed with referral. ? ?Frankey Poot to send CSW release so I can provide requested medical records.  Informed Frankey Poot that fl2 would need to go through PCP office- CSW messaged RNCM with CHW to see if she could assist with fl2. ? ?Will continue to follow and assist as needed ? ?Jorge Ny, LCSW ?Clinical Social Worker ?[facility] ?Desk#: [phone] ?Cell#: [phone] ? ? ? ?

## 2022-03-15 NOTE — Telephone Encounter (Signed)
Message received from Tammy Sours, [place] requesting an [facility] for the patient for TCL program.  ? ? ?I spoke to Maryan Puls # [phone]  caseworker with [facility]/TCL program.  He is requesting the [facility] as part of the evaluation process for their housing program. He explained that he met with the patient for the first time today. He is not sure that he will qualify for the program but he believes he may be a candidate.  He said that there is no rush for the [facility]; but when completed, it can be faxed to # [phone].  ?

## 2022-03-15 NOTE — Telephone Encounter (Signed)
Medication Refill - Medication:  ?atorvastatin (LIPITOR) 80 MG tablet  ?isosorbide-hydrALAZINE (BIDIL) 20-37.5 MG tablet  ?gabapentin (NEURONTIN) 100 MG capsule  ?OLANZapine (ZYPREXA) 2.5 MG tablet  ?sacubitril-valsartan (ENTRESTO) 49-51 MG  ?hydrOXYzine (ATARAX) 25 MG tablet  ?ezetimibe (ZETIA) 10 MG tablet  ? ?Has the patient contacted their pharmacy? Yes.   ?Contact PCP ? ?Preferred Pharmacy (with phone number or street name):  ?[facility] [address] [place], [place] [address]  ?Phone:  [phone]  Fax:  [phone]  ? ?Has the patient been seen for an appointment in the last year OR does the patient have an upcoming appointment? Yes.   ? ?Agent: Please be advised that RX refills may take up to 3 business days. We ask that you follow-up with your pharmacy. ?

## 2022-03-16 ENCOUNTER — Other Ambulatory Visit: Payer: Self-pay

## 2022-03-16 MED ORDER — OLANZAPINE 2.5 MG PO TABS
2.5000 mg | ORAL_TABLET | Freq: Every day | ORAL | 0 refills | Status: DC
  Filled 2022-03-16: qty 30, 30d supply, fill #0

## 2022-03-16 MED ORDER — EZETIMIBE 10 MG PO TABS
10.0000 mg | ORAL_TABLET | Freq: Every day | ORAL | 0 refills | Status: DC
  Filled 2022-03-16: qty 30, 30d supply, fill #0

## 2022-03-16 MED ORDER — ATORVASTATIN CALCIUM 80 MG PO TABS
80.0000 mg | ORAL_TABLET | Freq: Every day | ORAL | 0 refills | Status: DC
Start: 2022-03-16 — End: 2022-04-16
  Filled 2022-03-16: qty 30, 30d supply, fill #0

## 2022-03-16 MED ORDER — ISOSORB DINITRATE-HYDRALAZINE 20-37.5 MG PO TABS
1.0000 | ORAL_TABLET | Freq: Three times a day (TID) | ORAL | 0 refills | Status: DC
  Filled 2022-03-16: qty 90, 30d supply, fill #0

## 2022-03-16 MED ORDER — GABAPENTIN 100 MG PO CAPS
100.0000 mg | ORAL_CAPSULE | Freq: Three times a day (TID) | ORAL | 0 refills | Status: DC
  Filled 2022-03-16: qty 30, 10d supply, fill #0

## 2022-03-16 MED ORDER — HYDROXYZINE HCL 25 MG PO TABS
25.0000 mg | ORAL_TABLET | Freq: Three times a day (TID) | ORAL | 0 refills | Status: DC | PRN
  Filled 2022-03-16: qty 60, 20d supply, fill #0

## 2022-03-16 NOTE — Telephone Encounter (Signed)
Requested medication (s) are due for refill today - yes ? ?Requested medication (s) are on the active medication list -yes ? ?Future visit scheduled -no ? ?Last refill: 02/26/22 1 month supply ? ?Notes to clinic: Request RF: all medications are current on medication list- outside provider last filled- sent for review by office ? ?Requested Prescriptions  ?Pending Prescriptions Disp Refills  ? atorvastatin (LIPITOR) 80 MG tablet 30 tablet 0  ?  Sig: Take 1 tablet (80 mg total) by mouth daily.  ?  ? Cardiovascular:  Antilipid - Statins Failed - 03/15/2022  3:14 PM  ?  ?  Failed - Lipid Panel in normal range within the last 12 months  ?  Cholesterol, Total  ?Date Value Ref Range Status  ?03/07/2018 144 100 - 199 mg/dL Final  ? ?Cholesterol  ?Date Value Ref Range Status  ?02/14/2022 166 0 - 200 mg/dL Final  ? ?LDL Calculated  ?Date Value Ref Range Status  ?03/07/2018 73 0 - 99 mg/dL Final  ? ?LDL Cholesterol  ?Date Value Ref Range Status  ?02/14/2022 101 (H) 0 - 99 mg/dL Final  ?  Comment:  ?         ?Total Cholesterol/HDL:CHD Risk ?Coronary Heart Disease Risk Table ?                    Men   Women ? 1/2 Average Risk   3.4   3.3 ? Average Risk       5.0   4.4 ? 2 X Average Risk   9.6   7.1 ? 3 X Average Risk  23.4   11.0 ?       ?Use the calculated Patient Ratio ?above and the CHD Risk Table ?to determine the patient's CHD Risk. ?       ?ATP III CLASSIFICATION (LDL): ? <100     mg/dL   Optimal ? 100-129  mg/dL   Near or Above ?                   Optimal ? 130-159  mg/dL   Borderline ? 160-189  mg/dL   High ? >190     mg/dL   Very High ?Performed at [facility], [place] [address], [place], [place] [address] ?  ? ?HDL  ?Date Value Ref Range Status  ?02/14/2022 54 >40 mg/dL Final  ?03/07/2018 36 (L) >39 mg/dL Final  ? ?Triglycerides  ?Date Value Ref Range Status  ?02/14/2022 57 <150 mg/dL Final  ? ?  ?  ?  Passed - Patient is not pregnant  ?  ?  Passed - Valid encounter within last 12 months  ?  Recent Outpatient Visits    ? ?      ? 2 months ago Controlled type 2 diabetes mellitus with diabetic nephropathy, without long-term current use of insulin ([place])  ? [place], Charlane Ferretti, MD  ? 3 months ago Blurred vision, right eye  ? [place] Ladell Pier, MD  ? 11 months ago Hypertensive heart disease with acute on chronic combined systolic and diastolic congestive heart failure ([place])  ? [place], Charlane Ferretti, MD  ? 1 year ago Chronic combined systolic and diastolic congestive heart failure ([place])  ? [place] Karle Plumber B, MD  ? 2 years ago Controlled type 2 diabetes mellitus with diabetic nephropathy, without long-term current use of  insulin ([place])  ? [place], Charlane Ferretti, MD  ? ?  ?  ? ?  ?  ?  ? isosorbide-hydrALAZINE (BIDIL) 20-37.5 MG tablet 90 tablet 0  ?  Sig: Take 1 tablet by mouth 3 (three) times daily.  ?  ? Cardiovascular:  Vasodilators Failed - 03/15/2022  3:14 PM  ?  ?  Failed - ANA Screen, Ifa, Serum in normal range and within 360 days  ?  No results found for: ANA, ANATITER, LABANTI  ?  ?  ?  Failed - Last BP in normal range  ?  BP Readings from Last 1 Encounters:  ?12/05/21 (!) 174/97  ?  ?  ?  ?  Passed - HCT in normal range and within 360 days  ?  HCT  ?Date Value Ref Range Status  ?02/14/2022 44.3 39.0 - 52.0 % Final  ?  ?  ?  ?  Passed - HGB in normal range and within 360 days  ?  Hemoglobin  ?Date Value Ref Range Status  ?02/14/2022 14.9 13.0 - 17.0 g/dL Final  ?  ?  ?  ?  Passed - RBC in normal range and within 360 days  ?  RBC  ?Date Value Ref Range Status  ?02/14/2022 4.94 4.22 - 5.81 MIL/uL Final  ?  ?  ?  ?  Passed - WBC in normal range and within 360 days  ?  WBC  ?Date Value Ref Range Status  ?02/14/2022 5.7 4.0 - 10.5 K/uL Final  ?  ?  ?  ?  Passed - PLT in normal range and within 360 days  ?  Platelets  ?Date Value Ref Range  Status  ?02/14/2022 221 150 - 400 K/uL Final  ?  ?  ?  ?  Passed - Valid encounter within last 12 months  ?  Recent Outpatient Visits   ? ?      ? 2 months ago Controlled type 2 diabetes mellitus with diabetic nephropathy, without long-term current use of insulin ([place])  ? [place], Charlane Ferretti, MD  ? 3 months ago Blurred vision, right eye  ? [place] Ladell Pier, MD  ? 11 months ago Hypertensive heart disease with acute on chronic combined systolic and diastolic congestive heart failure ([place])  ? [place], Charlane Ferretti, MD  ? 1 year ago Chronic combined systolic and diastolic congestive heart failure ([place])  ? [place] Ladell Pier, MD  ? 2 years ago Controlled type 2 diabetes mellitus with diabetic nephropathy, without long-term current use of insulin ([place])  ? [place], Charlane Ferretti, MD  ? ?  ?  ? ?  ?  ?  ? gabapentin (NEURONTIN) 100 MG capsule 90 capsule 0  ?  Sig: Take 1 capsule (100 mg total) by mouth 3 (three) times daily.  ?  ? Neurology: Anticonvulsants - gabapentin Passed - 03/15/2022  3:14 PM  ?  ?  Passed - Cr in normal range and within 360 days  ?  Creat  ?Date Value Ref Range Status  ?01/01/2017 1.26 0.60 - 1.35 mg/dL Final  ? ?Creatinine, Ser  ?Date Value Ref Range Status  ?02/19/2022 1.20 0.61 - 1.24 mg/dL Final  ? ?Creatinine, Urine  ?Date Value Ref Range Status  ?01/01/2017 194 20 - 370 mg/dL Final  ?  ?  ?  ?  Passed - Completed PHQ-2 or PHQ-9 in the last 360 days  ?  ?  Passed - Valid encounter within last 12 months  ?  Recent Outpatient Visits   ? ?      ? 2 months ago Controlled type 2 diabetes mellitus with diabetic nephropathy, without long-term current use of insulin ([place])  ? [place], Charlane Ferretti, MD  ? 3 months ago Blurred vision, right eye  ? [place] Ladell Pier, MD  ? 11 months ago Hypertensive heart disease with acute on chronic combined systolic and diastolic congestive heart failure ([place])  ? [place], Charlane Ferretti, MD  ? 1 year ago Chronic combined systolic and diastolic congestive heart failure ([place])  ? [place] Ladell Pier, MD  ? 2 years ago Controlled type 2 diabetes mellitus with diabetic nephropathy, without long-term current use of insulin ([place])  ? [place], Charlane Ferretti, MD  ? ?  ?  ? ?  ?  ?  ? OLANZapine (ZYPREXA) 2.5 MG tablet 30 tablet 0  ?  Sig: Take 1 tablet (2.5 mg total) by mouth daily.  ?  ? Not Delegated - Psychiatry:  Antipsychotics - Second Generation (Atypical) - olanzapine Failed - 03/15/2022  3:14 PM  ?  ?  Failed - This refill cannot be delegated  ?  ?  Failed - Last BP in normal range  ?  BP Readings from Last 1 Encounters:  ?12/05/21 (!) 174/97  ?  ?  ?  ?  Failed - Lipid Panel in normal range within the last 12 months  ?  Cholesterol, Total  ?Date Value Ref Range Status  ?03/07/2018 144 100 - 199 mg/dL Final  ? ?Cholesterol  ?Date Value Ref Range Status  ?02/14/2022 166 0 - 200 mg/dL Final  ? ?LDL Calculated  ?Date Value Ref Range Status  ?03/07/2018 73 0 - 99 mg/dL Final  ? ?LDL Cholesterol  ?Date Value Ref Range Status  ?02/14/2022 101 (H) 0 - 99 mg/dL Final  ?  Comment:  ?         ?Total Cholesterol/HDL:CHD Risk ?Coronary Heart Disease Risk Table ?                    Men   Women ? 1/2 Average Risk   3.4   3.3 ? Average Risk       5.0   4.4 ? 2 X Average Risk   9.6   7.1 ? 3 X Average Risk  23.4   11.0 ?       ?Use the calculated Patient Ratio ?above and the CHD Risk Table ?to determine the patient's CHD Risk. ?       ?ATP III CLASSIFICATION (LDL): ? <100     mg/dL   Optimal ? 100-129  mg/dL   Near or Above ?                   Optimal ? 130-159  mg/dL   Borderline ? 160-189  mg/dL   High ? >190     mg/dL    Very High ?Performed at [facility], [place] [address], [place], [place] [address] ?  ? ?HDL  ?Date Value Ref Range Status  ?02/14/2022 54 >40 mg/dL Final  ?03/07/2018 36 (L) >39 mg/dL Final  ? ?[place]

## 2022-03-19 ENCOUNTER — Other Ambulatory Visit: Payer: Self-pay

## 2022-03-20 ENCOUNTER — Other Ambulatory Visit: Payer: Self-pay

## 2022-03-20 ENCOUNTER — Other Ambulatory Visit (HOSPITAL_COMMUNITY): Payer: Self-pay

## 2022-03-20 NOTE — Telephone Encounter (Signed)
Call placed to patient and explained a request has come from [facility] regarding TCL program requesting an FL2.  The patient said that he needs help getting out of the shelter and provided authorization for this CM to fax the completed FL2 to [facility]. ? ?The Fl2 was then faxed as requested.  ?

## 2022-03-22 ENCOUNTER — Telehealth (HOSPITAL_COMMUNITY): Payer: Self-pay | Admitting: Pharmacy Technician

## 2022-03-22 ENCOUNTER — Other Ambulatory Visit (HOSPITAL_COMMUNITY): Payer: Self-pay

## 2022-03-22 MED ORDER — ENTRESTO 49-51 MG PO TABS: ORAL_TABLET | ORAL | 3 refills | Status: DC

## 2022-03-22 NOTE — Telephone Encounter (Signed)
Advanced [facility] Failure Patient Advocate Encounter ? ?Sent patient assistance for Bidil and Entresto via fax. POI was not provided, will initially be denied.  ?

## 2022-03-23 MED ORDER — ISOSORB DINITRATE-HYDRALAZINE 20-37.5 MG PO TABS: 1.0000 | ORAL_TABLET | Freq: Three times a day (TID) | ORAL | 3 refills | Status: DC

## 2022-03-26 ENCOUNTER — Other Ambulatory Visit: Payer: Self-pay

## 2022-03-29 ENCOUNTER — Other Ambulatory Visit: Payer: Self-pay

## 2022-03-29 NOTE — Telephone Encounter (Signed)
Pt is calling to let Opal Sidles know that Frankey Poot From [place] is stating the the [facility] needs to come from Dr. Margarita Rana. ?CB- [phone] or  ?[phone]  ?

## 2022-03-29 NOTE — Telephone Encounter (Signed)
Patient and his case manager [name] called in and stated that he have brought in [facility] form to be completed and the time is near that it is due immediately needing this form back please so that patient could be moved from the shelter into a place of his own. Please contact [name] at Ph#  [phone] ext # [phone] ?

## 2022-03-29 NOTE — Telephone Encounter (Signed)
Call returned to [facility].  She explained that she is an advocate for the patient and an FL2 is needed for patient's housing.  I informed her that the [facility] was faxed to [facility] on 03/20/2022  I will contact him and re-fax if necessary.   ? ?Call placed to [facility]. He said he is not sure why he did not receive th FL2 but he is not in the office on a regular basis.  I confirmed the fax number again and re-faxed the FL2 to him  ?

## 2022-04-02 NOTE — Telephone Encounter (Signed)
Advanced Heart Failure Patient Advocate Encounter ? ?[employer] sent communication that POI would be needed in order to continue processing the patient's application.  ?

## 2022-04-13 ENCOUNTER — Telehealth (HOSPITAL_COMMUNITY): Payer: Self-pay

## 2022-04-13 NOTE — Telephone Encounter (Signed)
Called and left patient a detailed message  to confirm/remind patient of their appointment at the [facility] on 04/16/22.  ? ?

## 2022-04-13 NOTE — Progress Notes (Signed)
?Advanced Heart Failure Clinic Note  ? ? ?PCP: Charlott Rakes, MD ?HF Cardiologist: Dr. Aundra Dubin ? ?Reason for Visit: F/u for chronic systolic heart failure  ? ?HPI: ?Matthew [name].is a 56 y.o. with PMH significant for combined systolic CHF with biventricular failure, T2DM, HTN, OSA, tobacco use, HLD, cocaine use, poor medication adherence, depression,  CVA, and CKD 3a.    ?He was first admitted with HF in 2015 in the setting of long standing uncontrolled hypertension and smoking.  Had a LHC with normal cors.  ?  ?Had a CVA on 01/2017 involving the corpus callosum also punctate acute right parietal infarct.  Was not followed by cardiology or hospitalized for CHF during this time period.   ?  ?March 2021 he began having CHF exacerbations after running out of meds between admissions.  Left AMA in March. Another similar admission in July, he was out of his medications at that time as well.   ?  ?Admitted 12/2020 with CHF exacerbation after running out of his medications.  He was diuresed, put on an excellent medication regimen. He also endorsed suicidal ideation at that time and was subsequently admitted to behavioral health. UDS was positive for cocaine, echo showed EF < 20% with moderate RV dysfunction.  ? ?He was seen in the HF clinic 02/08/21 and lasix was cut back to 20 mg daily, later Lasix was stopped.  ? ?Echo 5/22 with EF improved to 50%.  ? ?Follow up 6/22, [name] was stopped due to dizziness. ? ?Follow up 9/22 and was fluid overloaded and SOB. Had ran out of all of his meds, including lasix ~3 months prior. ReDs 46%. Given Lasix 80 mg IV x 1 in the office, GDMT adjusted. Instructed to f/u in 1 week but no showed to the appt.  ? ?Follow up 11/22 he had been off several medications, more SOB, continued to use cocaine. Mediations restarted, pillbox filled. Seen back 12/22, stable NYHA III, volume OK ReDs 35%. Arlyce Harman restarted and advised 4 week follow up. No show x 2 appts.  ? ?Admitted 3/23 with SI and  auditory hallucinations.  ? ?Today he returns for HF follow up. Overall feeling much better. He has been off cocaine, ETOH and tobacco x 3 months. Staying in a shelter in [place], but has been approved for housing through [place]. He has mild dyspnea walking on flat ground. Denies palpitations, CP, dizziness, edema, or PND/Orthopnea. Appetite improved. No fever or chills. Unable to weigh at shelter. Taking all medications. Does not have CPAP at shelter. Drinking a lot of fluids. ? ?ECG (personally reviewed): none ordered today. ? ?REDS clip 37%  ? ?Labs (5/22): K 3.9, creatinine 1.6 ?Labs (6/22): K 3.8, creatinine 1.49 ?Labs (8/22): K 4.6 creatinine 1.25 ?Labs (9/22): K 4.1, creatinine 1.29, LDL 40  ?Labs (11/22): K 3.7 creatinine 1.91 ?Labs (3/23): K 4.2, creatinine 1.2, LDL 101, HDL 54 ? ?PMH: ?1. HTN ?2. Type 2 diabetes ?3. H/o cocaine abuse: Positive UDS 1/22.  ?4. CVA 2018 ?5. OSA: Uses CPAP.  ?6. Chronic systolic CHF: Nonischemic cardiomyopathy.  ?- LHC (2015): Normal coronaries.  ?- Echo (2021): EF 40-45%. ?- Echo (1/22): EF <20%, moderate LV dilation, moderately decreased RV systolic function, severe LAE.  ?- Echo (5/22): EF 50%, moderate LV dilation, normal RV.  ?- Echo (1/23): EF 30-35%, moderately decreased LV, normal RV, ascending aorta 39 mm ?7. Depression ?8. CKD stage 3 ?9. Hyperlipidemia ? ?Current Outpatient Medications  ?Medication Sig Dispense Refill  ? aspirin EC  81 MG EC tablet Take 1 tablet (81 mg total) by mouth daily. Swallow whole. 30 tablet 0  ? atorvastatin (LIPITOR) 80 MG tablet Take 1 tablet (80 mg total) by mouth daily. 30 tablet 0  ? ezetimibe (ZETIA) 10 MG tablet Take 1 tablet (10 mg total) by mouth daily. 30 tablet 0  ? furosemide (LASIX) 40 MG tablet Take 1 tablet (40 mg total) by mouth daily. 30 tablet 0  ? gabapentin (NEURONTIN) 100 MG capsule Take 1 capsule (100 mg total) by mouth 3 (three) times daily. 30 capsule 0  ? hydrOXYzine (ATARAX) 25 MG tablet Take 1 tablet (25 mg total) by mouth  3 (three) times daily as needed for anxiety. 60 tablet 0  ? ibuprofen (ADVIL) 400 MG tablet Take 1 tablet (400 mg total) by mouth every 6 (six) hours as needed for moderate pain. 30 tablet 0  ? isosorbide-hydrALAZINE (BIDIL) 20-37.5 MG tablet Take 1 tablet by mouth 3 (three) times daily. 270 tablet 3  ? OLANZapine (ZYPREXA) 2.5 MG tablet Take 1 tablet (2.5 mg total) by mouth daily. 30 tablet 0  ? OLANZapine (ZYPREXA) 7.5 MG tablet Take 1 tablet (7.5 mg total) by mouth at bedtime. 30 tablet 0  ? sacubitril-valsartan (ENTRESTO) 49-51 MG TAKE 1 TABLET BY MOUTH 2 (TWO) TIMES DAILY. REFILLS MUST COME THRU NOVARTIS-RE/ENROLL W/HF CLINIC 180 tablet 3  ? sertraline (ZOLOFT) 25 MG tablet Take 3 tablets (75 mg total) by mouth daily. 90 tablet 0  ? spironolactone (ALDACTONE) 25 MG tablet Take 0.5 tablets (12.5 mg total) by mouth daily. 15 tablet 0  ? timolol (TIMOPTIC) 0.5 % ophthalmic solution Place 1 drop into both eyes daily after breakfast. 5 mL 0  ? ?No current facility-administered medications for this encounter.  ? ?No Known Allergies ? ?Social History  ? ?Socioeconomic History  ? Marital status: Widowed  ?  Spouse name: Not on file  ? Number of children: 2  ? Years of education: 86  ? Highest education level: Not on file  ?Occupational History  ? Occupation: [employer]  ?Tobacco Use  ? Smoking status: Former  ?  Years: 20.00  ?  Types: Cigarettes  ? Smokeless tobacco: Former  ?  Quit date: 11/14/2020  ?Vaping Use  ? Vaping Use: Never used  ?Substance and Sexual Activity  ? Alcohol use: No  ? Drug use: No  ? Sexual activity: Yes  ?Other Topics Concern  ? Not on file  ?Social History Narrative  ? Lives with 2 young children in [place].  Wife died in 05-09-2020.  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  ? Left-handed  ? Caffeine: occasional tea  ? ?Social Determinants of Health  ? ?Financial Resource Strain: Not on file  ?Food Insecurity: Not on file  ?Transportation Needs: Not on file  ?Physical  Activity: Not on file  ?Stress: Not on file  ?Social Connections: Not on file  ?Intimate Partner Violence: Not on file  ? ?Family History  ?Problem Relation Age of Onset  ? Heart attack Father   ?     died @ 43  ? Cervical cancer Mother   ?     died @ 63  ? ?BP (!) 178/102   Pulse 74   Wt 99.7 kg (219 lb 12.8 oz)   SpO2 98%   BMI 32.46 kg/m?  ? ?Wt Readings from Last 3 Encounters:  ?04/16/22 99.7 kg (219 lb 12.8 oz)  ?02/15/22 85.7 kg (189  lb)  ?12/05/21 89.2 kg (196 lb 9.6 oz)  ? ?PHYSICAL EXAM: ?General:  NAD. No resp difficulty ?HEENT: Normal ?Neck: Supple. No JVD. Carotids 2+ bilat; no bruits. No lymphadenopathy or thryomegaly appreciated. ?Cor: PMI nondisplaced. Regular rate & rhythm. No rubs, gallops or murmurs. ?Lungs: Diminished throughout ?Abdomen: Soft, nontender, nondistended. No hepatosplenomegaly. No bruits or masses. Good bowel sounds. ?Extremities: No cyanosis, clubbing, rash, trace BLE pre-tibial edema ?Neuro: Alert & oriented x 3, cranial nerves grossly intact. Moves all 4 extremities w/o difficulty. Affect pleasant. ? ?ASSESSMENT & PLAN: ?1. 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  Due to poorly controlled HTN +/- cocaine abuse.  Normal coronaries on 2015 cath.  Echo in 1/22 showed EF < 20% with moderate RV dysfunction (off meds, + cocaine).  Echo in 5/22 with EF up to 50% (on meds and off cocaine). Echo 1/23 (off meds and on cocaine) EF down to 30-35%. Stable NYHA II-early III today. He does not appear markedly volume overloaded on exam, ReDs 37%, but weight up significantly. I think this is in part due to psych medications and no longer using cocaine. ?- Increase Entresto to 97/103 mg bid. ?- Increase spiro to 25 mg daily. BMET/BNP today, repeat in 1 week. ?- Add beta blocker next. ?- Continue BiDil 1 tab tid. ?- Did not tolerate Farxiga due to dizziness  ?- EF previously out of ICD range, most recent echo EF back down. Not candidate with on-going substance abuse, although he has  3 months of sobriety. ?- Plan to repeat echo when GDMT optimized and he has been compliant with meds (and off cocaine). If EF remains down, then could consider cath. ?2. Hypertensive Urgency: BP 190/124 --> 17

## 2022-04-16 ENCOUNTER — Other Ambulatory Visit (HOSPITAL_COMMUNITY): Payer: Self-pay

## 2022-04-16 ENCOUNTER — Ambulatory Visit (HOSPITAL_COMMUNITY)
Admission: RE | Admit: 2022-04-16 | Discharge: 2022-04-16 | Disposition: A | Discharge status: Home / Self Care | Payer: Medicaid Other | Source: Ambulatory Visit | Attending: Family Medicine | Admitting: Family Medicine

## 2022-04-16 ENCOUNTER — Encounter (HOSPITAL_COMMUNITY): Payer: Self-pay

## 2022-04-16 VITALS — BP 178/102 | HR 74 | Wt 219.8 lb

## 2022-04-16 DIAGNOSIS — Z8673 Personal history of transient ischemic attack (TIA), and cerebral infarction without residual deficits: Secondary | ICD-10-CM

## 2022-04-16 DIAGNOSIS — I5042 Chronic combined systolic (congestive) and diastolic (congestive) heart failure: Secondary | ICD-10-CM

## 2022-04-16 DIAGNOSIS — E785 Hyperlipidemia, unspecified: Secondary | ICD-10-CM | POA: Diagnosis not present

## 2022-04-16 DIAGNOSIS — F17200 Nicotine dependence, unspecified, uncomplicated: Secondary | ICD-10-CM | POA: Diagnosis not present

## 2022-04-16 DIAGNOSIS — I5082 Biventricular heart failure: Secondary | ICD-10-CM | POA: Insufficient documentation

## 2022-04-16 DIAGNOSIS — N182 Chronic kidney disease, stage 2 (mild): Secondary | ICD-10-CM

## 2022-04-16 DIAGNOSIS — E1122 Type 2 diabetes mellitus with diabetic chronic kidney disease: Secondary | ICD-10-CM | POA: Diagnosis not present

## 2022-04-16 DIAGNOSIS — Z720 Tobacco use: Secondary | ICD-10-CM

## 2022-04-16 DIAGNOSIS — F332 Major depressive disorder, recurrent severe without psychotic features: Secondary | ICD-10-CM

## 2022-04-16 DIAGNOSIS — I130 Hypertensive heart and chronic kidney disease with heart failure and stage 1 through stage 4 chronic kidney disease, or unspecified chronic kidney disease: Secondary | ICD-10-CM | POA: Insufficient documentation

## 2022-04-16 DIAGNOSIS — F329 Major depressive disorder, single episode, unspecified: Secondary | ICD-10-CM | POA: Diagnosis not present

## 2022-04-16 DIAGNOSIS — N1831 Chronic kidney disease, stage 3a: Secondary | ICD-10-CM | POA: Diagnosis not present

## 2022-04-16 DIAGNOSIS — Z91148 Patient's other noncompliance with medication regimen for other reason: Secondary | ICD-10-CM

## 2022-04-16 DIAGNOSIS — G4733 Obstructive sleep apnea (adult) (pediatric): Secondary | ICD-10-CM

## 2022-04-16 DIAGNOSIS — F1410 Cocaine abuse, uncomplicated: Secondary | ICD-10-CM

## 2022-04-16 DIAGNOSIS — E1121 Type 2 diabetes mellitus with diabetic nephropathy: Secondary | ICD-10-CM

## 2022-04-16 DIAGNOSIS — Z79899 Other long term (current) drug therapy: Secondary | ICD-10-CM | POA: Diagnosis not present

## 2022-04-16 DIAGNOSIS — I160 Hypertensive urgency: Secondary | ICD-10-CM | POA: Diagnosis not present

## 2022-04-16 DIAGNOSIS — E782 Mixed hyperlipidemia: Secondary | ICD-10-CM

## 2022-04-16 DIAGNOSIS — I428 Other cardiomyopathies: Secondary | ICD-10-CM | POA: Insufficient documentation

## 2022-04-16 LAB — BASIC METABOLIC PANEL
Anion gap: 5 (ref 5–15)
BUN: 12 mg/dL (ref 6–20)
CO2: 27 mmol/L (ref 22–32)
Calcium: 9.2 mg/dL (ref 8.9–10.3)
Chloride: 107 mmol/L (ref 98–111)
Creatinine, Ser: 1.12 mg/dL (ref 0.61–1.24)
GFR, Estimated: >60 mL/min (ref >60)
Glucose, Bld: 96 mg/dL (ref 70–99)
Potassium: 3.7 mmol/L (ref 3.5–5.1)
Sodium: 139 mmol/L (ref 135–145)

## 2022-04-16 LAB — BRAIN NATRIURETIC PEPTIDE: B Natriuretic Peptide: 33.2 pg/mL (ref 0.0–100.0)

## 2022-04-16 MED ORDER — ENTRESTO 97-103 MG PO TABS: 1.0000 | ORAL_TABLET | Freq: Two times a day (BID) | ORAL | 11 refills | Status: DC

## 2022-04-16 MED ORDER — SPIRONOLACTONE 25 MG PO TABS
25.0000 mg | ORAL_TABLET | Freq: Every day | ORAL | 2 refills | Status: DC
  Filled 2022-04-16: qty 30, 30d supply, fill #0

## 2022-04-16 MED ORDER — ATORVASTATIN CALCIUM 80 MG PO TABS
80.0000 mg | ORAL_TABLET | Freq: Every day | ORAL | 2 refills | Status: DC
  Filled 2022-04-16: qty 30, 30d supply, fill #0

## 2022-04-16 MED ORDER — FUROSEMIDE 40 MG PO TABS
40.0000 mg | ORAL_TABLET | Freq: Every day | ORAL | 2 refills | Status: DC
  Filled 2022-04-16: qty 30, 30d supply, fill #0

## 2022-04-16 NOTE — Patient Instructions (Signed)
Labs done today. We will contact you only if your labs are abnormal. ? ?INCREASE Entresto to 97-'103mg'$  (1 tablet) by mouth 2 times daily. ? ?INCREASE Spironolactone to '25mg'$  (1 tablet) by mouth daily.  ? ?No other medication changes were made. Please continue all current medications as prescribed. ? ?Your physician recommends that you schedule a follow-up appointment in: 7-10 days for a lab only appointment, 3 weeks with our NP/PA Clinic and in 3 months with Dr. Aundra Dubin ? ?If you have any questions or concerns before your next appointment please send [place] a message through [place] or call our office at [phone].   ? ?TO LEAVE A MESSAGE FOR THE NURSE SELECT OPTION 2, PLEASE LEAVE A MESSAGE INCLUDING: ?YOUR NAME ?DATE OF BIRTH ?CALL BACK NUMBER ?REASON FOR CALL**this is important as we prioritize the call backs ? ?YOU WILL RECEIVE A CALL BACK THE SAME DAY AS LONG AS YOU CALL BEFORE 4:00 PM ? ? ?Do the following things EVERYDAY: ?Weigh yourself in the morning before breakfast. Write it down and keep it in a log. ?Take your medicines as prescribed ?Eat low salt foods--Limit salt (sodium) to 2000 mg per day.  ?Stay as active as you can everyday ?Limit all fluids for the day to less than 2 liters ? ? ?At the [facility], you and your health needs are our priority. As part of our continuing mission to provide you with exceptional heart care, we have created designated Provider Care Teams. These Care Teams include your primary Cardiologist (physician) and Advanced Practice Providers (APPs- Physician Assistants and Nurse Practitioners) who all work together to provide you with the care you need, when you need it.  ? ?You may see any of the following providers on your designated Care Team at your next follow up: ?Dr Glori Bickers ?Dr Loralie Champagne ?Darrick Grinder, NP ?Lyda Jester, PA ?Audry Riles, PharmD ? ? ?Please be sure to bring in all your medications bottles to every appointment.  ? ?

## 2022-04-16 NOTE — Progress Notes (Signed)
ReDS Vest / Clip - 04/16/22 1000   ? ?  ? ReDS Vest / Clip  ? Station Marker B   ? Ruler Value 34   ? ReDS Value Range Moderate volume overload   ? ReDS Actual Value 37   ? Anatomical Comments sitting   ? ?  ?  ? ?  ?  ?

## 2022-04-21 ENCOUNTER — Ambulatory Visit (HOSPITAL_COMMUNITY)
Admission: EM | Admit: 2022-04-21 | Discharge: 2022-04-26 | Disposition: A | Discharge status: Home / Self Care | Payer: No Payment, Other | Attending: Psychiatry | Admitting: Psychiatry

## 2022-04-21 DIAGNOSIS — F339 Major depressive disorder, recurrent, unspecified: Secondary | ICD-10-CM | POA: Diagnosis present

## 2022-04-21 DIAGNOSIS — I10 Essential (primary) hypertension: Secondary | ICD-10-CM | POA: Insufficient documentation

## 2022-04-21 DIAGNOSIS — Z7982 Long term (current) use of aspirin: Secondary | ICD-10-CM | POA: Insufficient documentation

## 2022-04-21 DIAGNOSIS — H409 Unspecified glaucoma: Secondary | ICD-10-CM | POA: Insufficient documentation

## 2022-04-21 DIAGNOSIS — Z20822 Contact with and (suspected) exposure to covid-19: Secondary | ICD-10-CM | POA: Insufficient documentation

## 2022-04-21 DIAGNOSIS — G4730 Sleep apnea, unspecified: Secondary | ICD-10-CM | POA: Insufficient documentation

## 2022-04-21 DIAGNOSIS — E119 Type 2 diabetes mellitus without complications: Secondary | ICD-10-CM | POA: Insufficient documentation

## 2022-04-21 DIAGNOSIS — F332 Major depressive disorder, recurrent severe without psychotic features: Secondary | ICD-10-CM | POA: Insufficient documentation

## 2022-04-21 DIAGNOSIS — I509 Heart failure, unspecified: Secondary | ICD-10-CM | POA: Insufficient documentation

## 2022-04-21 DIAGNOSIS — Z79899 Other long term (current) drug therapy: Secondary | ICD-10-CM | POA: Insufficient documentation

## 2022-04-21 DIAGNOSIS — F419 Anxiety disorder, unspecified: Secondary | ICD-10-CM | POA: Insufficient documentation

## 2022-04-21 LAB — CBC WITH DIFFERENTIAL/PLATELET
Abs Immature Granulocytes: 0 10*3/uL (ref 0.00–0.07)
Basophils Absolute: 0 10*3/uL (ref 0.0–0.1)
Basophils Relative: 1 %
Eosinophils Absolute: 0.1 10*3/uL (ref 0.0–0.5)
Eosinophils Relative: 2 %
HCT: 42 % (ref 39.0–52.0)
Hemoglobin: 14.1 g/dL (ref 13.0–17.0)
Immature Granulocytes: 0 %
Lymphocytes Relative: 27 %
Lymphs Abs: 1.5 10*3/uL (ref 0.7–4.0)
MCH: 30.1 pg (ref 26.0–34.0)
MCHC: 33.6 g/dL (ref 30.0–36.0)
MCV: 89.6 fL (ref 80.0–100.0)
Monocytes Absolute: 0.6 10*3/uL (ref 0.1–1.0)
Monocytes Relative: 10 %
Neutro Abs: 3.3 10*3/uL (ref 1.7–7.7)
Neutrophils Relative %: 60 %
Platelets: 218 10*3/uL (ref 150–400)
RBC: 4.69 MIL/uL (ref 4.22–5.81)
RDW: 13.2 % (ref 11.5–15.5)
WBC: 5.5 10*3/uL (ref 4.0–10.5)
nRBC: 0 % (ref 0.0–0.2)

## 2022-04-21 LAB — COMPREHENSIVE METABOLIC PANEL
ALT: 46 U/L — ABNORMAL HIGH (ref 0–44)
AST: 46 U/L — ABNORMAL HIGH (ref 15–41)
Albumin: 3.6 g/dL (ref 3.5–5.0)
Alkaline Phosphatase: 58 U/L (ref 38–126)
Anion gap: 7 (ref 5–15)
BUN: 8 mg/dL (ref 6–20)
CO2: 26 mmol/L (ref 22–32)
Calcium: 8.9 mg/dL (ref 8.9–10.3)
Chloride: 105 mmol/L (ref 98–111)
Creatinine, Ser: 1.15 mg/dL (ref 0.61–1.24)
GFR, Estimated: >60 mL/min (ref >60)
Glucose, Bld: 105 mg/dL — ABNORMAL HIGH (ref 70–99)
Potassium: 3.3 mmol/L — ABNORMAL LOW (ref 3.5–5.1)
Sodium: 138 mmol/L (ref 135–145)
Total Bilirubin: 0.9 mg/dL (ref 0.3–1.2)
Total Protein: 6.7 g/dL (ref 6.5–8.1)

## 2022-04-21 LAB — POCT URINE DRUG SCREEN - MANUAL ENTRY (I-SCREEN)
POC Amphetamine UR: NOT DETECTED
POC Buprenorphine (BUP): NOT DETECTED
POC Cocaine UR: NOT DETECTED
POC Marijuana UR: NOT DETECTED
POC Methadone UR: NOT DETECTED
POC Methamphetamine UR: NOT DETECTED
POC Morphine: NOT DETECTED
POC Oxazepam (BZO): NOT DETECTED
POC Oxycodone UR: NOT DETECTED
POC Secobarbital (BAR): NOT DETECTED

## 2022-04-21 LAB — POC SARS CORONAVIRUS 2 AG: SARSCOV2ONAVIRUS 2 AG: NEGATIVE

## 2022-04-21 LAB — ETHANOL: Alcohol, Ethyl (B): <10 mg/dL (ref <10)

## 2022-04-21 LAB — RESP PANEL BY RT-PCR (FLU A&B, COVID) ARPGX2
Influenza A by PCR: NEGATIVE
Influenza B by PCR: NEGATIVE
SARS Coronavirus 2 by RT PCR: NEGATIVE

## 2022-04-21 LAB — TSH: TSH: 0.762 u[IU]/mL (ref 0.350–4.500)

## 2022-04-21 MED ORDER — ALUM & MAG HYDROXIDE-SIMETH 200-200-20 MG/5ML PO SUSP: 30.0000 mL | ORAL | Status: DC | PRN

## 2022-04-21 MED ORDER — GABAPENTIN 100 MG PO CAPS
100.0000 mg | ORAL_CAPSULE | Freq: Three times a day (TID) | ORAL | Status: DC
  Administered 2022-04-21 – 2022-04-25 (×13): 100 mg via ORAL
  Filled 2022-04-21 (×4): qty 1
  Filled 2022-04-21: qty 42
  Filled 2022-04-21 (×10): qty 1

## 2022-04-21 MED ORDER — OLANZAPINE 7.5 MG PO TABS
7.5000 mg | ORAL_TABLET | Freq: Every day | ORAL | Status: DC
  Administered 2022-04-21: 7.5 mg via ORAL
  Filled 2022-04-21: qty 1

## 2022-04-21 MED ORDER — OLANZAPINE 2.5 MG PO TABS
2.5000 mg | ORAL_TABLET | Freq: Every day | ORAL | Status: DC
  Administered 2022-04-21 – 2022-04-22 (×2): 2.5 mg via ORAL
  Filled 2022-04-21: qty 1

## 2022-04-21 MED ORDER — ASPIRIN EC 81 MG PO TBEC
81.0000 mg | DELAYED_RELEASE_TABLET | Freq: Every day | ORAL | Status: DC
  Administered 2022-04-21 – 2022-04-25 (×5): 81 mg via ORAL
  Filled 2022-04-21: qty 1
  Filled 2022-04-21: qty 14
  Filled 2022-04-21 (×5): qty 1

## 2022-04-21 MED ORDER — ISOSORB DINITRATE-HYDRALAZINE 20-37.5 MG PO TABS
1.0000 | ORAL_TABLET | Freq: Three times a day (TID) | ORAL | Status: DC
  Administered 2022-04-21 – 2022-04-25 (×13): 1 via ORAL
  Filled 2022-04-21 (×13): qty 1
  Filled 2022-04-21: qty 42
  Filled 2022-04-21 (×3): qty 1

## 2022-04-21 MED ORDER — ATORVASTATIN CALCIUM 40 MG PO TABS
80.0000 mg | ORAL_TABLET | Freq: Every day | ORAL | Status: DC
  Administered 2022-04-21 – 2022-04-25 (×5): 80 mg via ORAL
  Filled 2022-04-21 (×3): qty 2
  Filled 2022-04-21: qty 28
  Filled 2022-04-21 (×2): qty 2

## 2022-04-21 MED ORDER — SPIRONOLACTONE 25 MG PO TABS
25.0000 mg | ORAL_TABLET | Freq: Every day | ORAL | Status: DC
  Administered 2022-04-21 – 2022-04-25 (×5): 25 mg via ORAL
  Filled 2022-04-21: qty 14
  Filled 2022-04-21 (×5): qty 1

## 2022-04-21 MED ORDER — HYDROXYZINE HCL 25 MG PO TABS
25.0000 mg | ORAL_TABLET | Freq: Three times a day (TID) | ORAL | Status: DC | PRN
  Administered 2022-04-23: 25 mg via ORAL
  Filled 2022-04-21: qty 1
  Filled 2022-04-21: qty 20

## 2022-04-21 MED ORDER — SERTRALINE HCL 25 MG PO TABS
75.0000 mg | ORAL_TABLET | Freq: Every day | ORAL | Status: DC
  Administered 2022-04-21 – 2022-04-23 (×3): 75 mg via ORAL
  Filled 2022-04-21 (×3): qty 3

## 2022-04-21 MED ORDER — EZETIMIBE 10 MG PO TABS
10.0000 mg | ORAL_TABLET | Freq: Every day | ORAL | Status: DC
  Administered 2022-04-21 – 2022-04-25 (×5): 10 mg via ORAL
  Filled 2022-04-21 (×2): qty 1
  Filled 2022-04-21: qty 14
  Filled 2022-04-21 (×3): qty 1

## 2022-04-21 MED ORDER — FUROSEMIDE 40 MG PO TABS
40.0000 mg | ORAL_TABLET | Freq: Every day | ORAL | Status: DC
  Administered 2022-04-21 – 2022-04-25 (×5): 40 mg via ORAL
  Filled 2022-04-21 (×3): qty 1
  Filled 2022-04-21: qty 14
  Filled 2022-04-21 (×3): qty 1

## 2022-04-21 MED ORDER — TIMOLOL MALEATE 0.5 % OP SOLN
1.0000 [drp] | Freq: Every day | OPHTHALMIC | Status: DC
  Administered 2022-04-22 – 2022-04-25 (×4): 1 [drp] via OPHTHALMIC
  Filled 2022-04-21 (×2): qty 5

## 2022-04-21 MED ORDER — SACUBITRIL-VALSARTAN 49-51 MG PO TABS
1.0000 | ORAL_TABLET | Freq: Two times a day (BID) | ORAL | Status: DC
  Administered 2022-04-21 – 2022-04-22 (×3): 1 via ORAL
  Filled 2022-04-21 (×5): qty 1

## 2022-04-21 MED ORDER — ACETAMINOPHEN 325 MG PO TABS: 650.0000 mg | ORAL_TABLET | Freq: Four times a day (QID) | ORAL | Status: DC | PRN

## 2022-04-21 MED ORDER — MAGNESIUM HYDROXIDE 400 MG/5ML PO SUSP
30.0000 mL | Freq: Every day | ORAL | Status: DC | PRN
Start: 2022-04-21 — End: 2022-04-26

## 2022-04-21 NOTE — ED Provider Notes (Signed)
Facility Based Crisis Admission H&P ? ?Date: 04/21/22 ?Patient Name: Matthew [name]. ?MRN: [id number] ?Chief Complaint: Suicidal  ideations ?   ? ?Diagnoses:  ?Final diagnoses:  ?Severe episode of recurrent major depressive disorder, without psychotic features ([place])  ? ? ?HPI:  Matthew [name] is a 56 year old male that presents with [facility] due to suicidal ideations.  He reports he has been off his medications for 1 week and becoming increasingly hopeless and irritable.  Reports a recent inpatient admission.  States he is currently residing at the local homeless shelter.  Does report a history of substance abuse, however denied using recently. ? ?It was reported by [facility] Department that patient called the crisis line and was asked to follow-up at [facility] urgent care to talk with someone.  He is denying illicit drug use or substance abuse history.  He presents flat, guarded and irritable during this assessment.  Will review chart and restart meds where appropriate.  Patient to be admitted to facility based crisis for medication management and mood stabilization. ? 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 ?PHQ 2-9:  ?[facility] from 03/23/2021 in [place] from 01/24/2021 in [place] Office Visit from 02/19/2019 in [place]  ?Thoughts that you would be better off dead, or of hurting yourself in some way Several days Not at all Several days  ?PHQ-9 Total Score '16  15 5  '$ ? ?  ?  ?[place] [facility] from 04/21/2022 in [facility] Admission (Discharged) from 02/15/2022 in [place] [facility] ED from 02/14/2022 in [facility]  ?C-SSRS RISK CATEGORY High Risk High Risk Low Risk  ? ?  ?  ? ?Total Time spent with patient: 15 minutes ? ?Musculoskeletal  ?Strength & Muscle Tone: within normal limits ?Gait & Station: normal ?Patient leans: N/A ? ?Psychiatric Specialty Exam  ?Presentation ?General Appearance: Appropriate for Environment ? ?Eye Contact:Fair ? ?Speech:Clear and Coherent ? ?Speech Volume:Decreased ? ?Handedness:Right ? ? ?Mood and Affect  ?Mood:Depressed (depressive symptoms are improving) ? ?Affect:Appropriate ? ? ?Thought Process  ?Thought Processes:Coherent ? ?Descriptions of Associations:Intact ? ?Orientation:Full (Time, Place and Person) ? ?Thought Content:Logical ? Diagnosis of Schizophrenia or Schizoaffective disorder in past: No ?  ?Hallucinations:No data recorded ?Ideas of Reference:None ? ?Suicidal Thoughts:No data recorded ?Homicidal Thoughts:No data recorded ? ?Sensorium  ?Memory:Immediate Good; Remote Good; Recent Good ? ?Judgment:Good ? ?Insight:Good ? ? ?Executive Functions  ?Concentration:Good ? ?Attention Span:Good ? ?Recall:Good ? ?Fund of [place] ? ?Language:Good ? ? ?Psychomotor Activity  ?Psychomotor Activity:No data recorded ? ?Assets  ?Assets:Communication Skills ? ? ?Sleep  ?Sleep:No data recorded ? ?No data recorded ? ?Physical Exam ?Vitals and nursing note reviewed.  ?HENT:  ?   Head: Normocephalic.  ?Cardiovascular:  ?   Rate and Rhythm: Normal rate and regular rhythm.  ?Psychiatric:     ?   Mood and Affect: Mood normal.     ?   Thought Content: Thought  content normal.  ? ?Review of Systems  ?Psychiatric/Behavioral:  Positive for depression and suicidal ideas. The patient is nervous/anxious.   ?All other systems reviewed and are negative. ? ?Blood pressure (!)  156/106, pulse 74, resp. rate 18, SpO2 99 %. There is no height or weight on file to calculate BMI. ? ?Past Psychiatric History:   ? ?Is the patient at risk to self? No  ?Has the patient been a risk to self in the past 6 months? No .    ?Has the patient been a risk to self within the distant past? No   ?Is the patient a risk to others? No   ?Has the patient been a risk to others in the past 6 months? No   ?Has the patient been a risk to others within the distant past? No  ? ?Past Medical History:  ?Past Medical History:  ?Diagnosis Date  ? Acute combined systolic and diastolic CHF, NYHA class 3 ([place])   ? a. 02/2014 Echo: EF 25-30%.  ? Cardiomyopathy ([place])   ? a. 02/2014 Echo: EF 25-30%, sev glob HK with inferolat HK->AK, mod conc LVH, Gr 2 DD, Mild MR, sev dil LA.  ? Depression   ? DM (diabetes mellitus) ([place])   ? High cholesterol   ? Hypertension   ? Morbid obesity ([place])   ? Tobacco abuse   ?  ?Past Surgical History:  ?Procedure Laterality Date  ? CATARACT EXTRACTION Right   ? LEFT HEART CATHETERIZATION WITH CORONARY ANGIOGRAM N/A 03/04/2014  ? Procedure: LEFT HEART CATHETERIZATION WITH CORONARY ANGIOGRAM;  Surgeon: Burnell Blanks, MD;  Location: [facility] CATH LAB;  Service: Cardiovascular;  Laterality: N/A;  ? TEE WITHOUT CARDIOVERSION N/A 02/04/2017  ? Procedure: TRANSESOPHAGEAL ECHOCARDIOGRAM (TEE);  Surgeon: Dorothy Spark, MD;  Location: [place];  Service: Cardiovascular;  Laterality: N/A;  ? ? ?Family History:  ?Family History  ?Problem Relation Age of Onset  ? Heart attack Father   ?     died @ 42  ? Cervical cancer Mother   ?     died @ 23  ? ? ?Social History:  ?Social History  ? ?Socioeconomic History  ? Marital status: Widowed  ?  Spouse name: Not on file  ? Number of children: 2  ? Years of education: 43  ? Highest education level: Not on file  ?Occupational History  ? Occupation: [employer]  ?Tobacco Use  ? Smoking status: Former  ?  Years: 20.00  ?  Types: Cigarettes  ? Smokeless tobacco:  Former  ?  Quit date: 11/14/2020  ?Vaping Use  ? Vaping Use: Never used  ?Substance and Sexual Activity  ? Alcohol use: No  ? Drug use: No  ? Sexual activity: Yes  ?Other Topics Concern  ? Not on file  ?Social History Narrative  ? Lives with 2 young children in [place].  Wife died in 2020/05/22.  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  ? Left-handed  ? Caffeine: occasional tea  ? ?Social Determinants of Health  ? ?Financial Resource Strain: Not on file  ?Food Insecurity: Not on file  ?Transportation Needs: Not on file  ?Physical Activity: Not on file  ?Stress: Not on file  ?Social Connections: Not on file  ?Intimate Partner Violence: Not on file  ? ? ?SDOH:  ?SDOH Screenings  ? ?Alcohol Screen: Low Risk   ? Last Alcohol Screening Score (AUDIT): 0  ?Depression (PHQ2-9): Not on file  ?Financial  Resource Strain: Not on file  ?Food Insecurity: Not on file  ?Housing: Not on file  ?Physical Activity: Not on file  ?Social Connections: Not on file  ?Stress: Not on file  ?Tobacco Use: Medium Risk  ? Smoking Tobacco Use: Former  ? Smokeless Tobacco Use: Former  ? Passive Exposure: Not on file  ?Transportation Needs: Not on file  ? ? ?Last Labs:  ?Admission on 04/21/2022  ?Component Date Value Ref Range Status  ? SARSCOV2ONAVIRUS 2 AG 04/21/2022 NEGATIVE  NEGATIVE Final  ? Comment: (NOTE) ?SARS-CoV-2 antigen NOT DETECTED.  ? ?Negative results are presumptive.  Negative results do not preclude ?SARS-CoV-2 infection and should not be used as the sole basis for ?treatment or other patient management decisions, including infection  ?control decisions, particularly in the presence of clinical signs and  ?symptoms consistent with COVID-19, or in those who have been in ?contact with the virus.  Negative results must be combined with ?clinical observations, patient history, and epidemiological ?information. The expected result is Negative. ? ?Fact Sheet for Patients:  HandmadeRecipes.com.cy ? ?Fact Sheet for Healthcare Providers: ?FuneralLife.at ? ?This test is not yet approved or cleared by the [place] and  ?has been authorized for detection and/or diagnosis of S

## 2022-04-21 NOTE — ED Notes (Signed)
Pt sleeping@this time. Breathing even and unlabored will continue to monitor for safety 

## 2022-04-21 NOTE — BH Assessment (Signed)
Comprehensive Clinical Assessment (CCA) Note ? ?04/21/2022 ?Matthew [name]. ?[id number] ? ?DISPOSITION: [facility] NP recommends patient to be admitted to [facility]. ? ?[place] ED from 04/21/2022 in [facility] Admission (Discharged) from 02/15/2022 in [place] [facility] ED from 02/14/2022 in [facility]  ?C-SSRS RISK CATEGORY High Risk High Risk Low Risk  ? ?  ? The patient demonstrates the following risk factors for suicide: Chronic risk factors for suicide include: substance use disorder. Acute risk factors for suicide include: unemployment. Protective factors for this patient include: coping skills. Considering these factors, the overall suicide risk at this point appears to be high. Patient is not appropriate for outpatient follow up.  ? ?Matthew [name] is a 56 year old male presenting to [facility] voluntarily by [employer] reporting ongoing S/I without a plan. Patient denies any H/I or AVH. Patient states he has been currently residing at the [facility] and feels "totally hopeless." Patient states that "everyone takes sh*t from him there" and he feels he would just be better off dead. Patient per chart review was last seen and assessed on 02/15/22 when he presented to [facility] with similar complaints that required an inpatient admission at that time. Patient on discharge was encouraged to follow up with resources that were provided although patient failed to do so. Patient renders limited history this date and when asked in reference to why he didn't follow up to receive ongoing OP care patient stated, "I don't know." Patient has a history significant for MDD and had been receiving OP medication management from his PCP [facility] FNP) although per chart review patient has not been prescribed mental health medications from that provider since 03/16/22. Patient also has a history of cocaine and alcohol use although states this date that he  has not used in the last 24 hours. Patient again renders limited history in reference to substances used, time frame or use patterns. Patient is homeless and has legal issues with a court date on March 26, 2022. Patient reports medical issues to include congestive heart failure. Patient is widowed and has two children that lives with other family members.  ?  ?During triage patient presented disorganized and rendered limited history. Patient speaks in a low soft voice and  oriented x 4. Patient's memory appears to be recent impaired and thoughts disorganized. Patient's mood is depressed with affect congruent. Patient does not appear to be responding to internal stimuli. ? ?Chief Complaint: No chief complaint on file. ? ?Visit Diagnosis: MDD recurrent without psychotic features, severe, Cocaine use   ? ? ?CCA Screening, Triage and Referral (STR) ? ?Patient Reported Information ?How did you hear about [place]? Self ? ?What Is the Reason for Your Visit/Call Today? Pt presents this date with ongoing S/I without a plan ? ?How Long Has This Been Causing You Problems? <Week ? ?What Do You Feel Would Help You the Most Today? Treatment for Depression or other mood problem ? ? ?Have You Recently Had Any Thoughts About Hurting Yourself? Yes ? ?Are You Planning to Commit Suicide/Harm Yourself At This time? No ? ? ?Have you Recently Had Thoughts About [facility]? No ? ?Are You Planning to Harm Someone at This Time? No ? ?Explanation: No data recorded ? ?Have You Used Any Alcohol or Drugs in the Past 24 Hours? No ? ?How Long Ago Did You Use Drugs or Alcohol? No data recorded ?What Did You Use and How Much? THC ? ? ?  Do You Currently Have a Therapist/Psychiatrist? No ? ?Name of Therapist/Psychiatrist: No data recorded ? ?Have You Been Recently Discharged From Any Office Practice or Programs? No ? ?Explanation of Discharge From Practice/Program: No data recorded ? ?  ?CCA Screening Triage Referral Assessment ?Type of Contact:  Face-to-Face ? ?Telemedicine Service Delivery:   ?Is this Initial or Reassessment? No data recorded ?Date Telepsych consult ordered in [facility]:  No data recorded ?Time Telepsych consult ordered in [facility]:  No data recorded ?Location of Assessment: [facility] Assessment Services ? ?Provider Location: [facility] Assessment Services ? ? ?Collateral Involvement: None at this time ? ? ?Does Patient Have a Stage manager Guardian? No data recorded ?Name and Contact of Legal Guardian: No data recorded ?If Minor and Not Living with Parent(s), Who has Custody? No data recorded ?Is CPS involved or ever been involved? Never ? ?Is APS involved or ever been involved? Never ? ? ?Patient Determined To Be At Risk for Harm To Self or Others Based on Review of Patient Reported Information or Presenting Complaint? Yes, for Self-Harm ? ?Method: No data recorded ?Availability of Means: No data recorded ?Intent: No data recorded ?Notification Required: No data recorded ?Additional Information for Danger to Others Potential: No data recorded ?Additional Comments for Danger to Others Potential: No data recorded ?Are There Guns or Other Weapons in [place]? No data recorded ?Types of Guns/Weapons: No data recorded ?Are These Weapons Safely Secured?                            No data recorded ?Who Could Verify You Are Able To Have These Secured: No data recorded ?Do You Have any Outstanding Charges, Pending Court Dates, Parole/Probation? No data recorded ?Contacted To Inform of Risk of Harm To Self or Others: Other: Comment (NA) ? ? ? ?Does Patient Present under Involuntary Commitment? No ? ?IVC Papers Initial File Date: No data recorded ? ?[place] of Residence: Kathleen Argue ? ? ?Patient Currently Receiving the Following Services: Medication Management ? ? ?Determination of Need: Urgent (48 hours) ? ? ?Options For Referral: Facility-Based Crisis ? ? ? ? ?CCA Biopsychosocial ?Patient Reported Schizophrenia/Schizoaffective Diagnosis in Past:  No ? ? ?Strengths: Pt is willing to participate in treatment ? ? ?Mental Health Symptoms ?Depression:   ?Change in energy/activity ?  ?Duration of Depressive symptoms:  ?Duration of Depressive Symptoms: Less than two weeks ?  ?Mania:   ?None ?  ?Anxiety:    ?Worrying; Difficulty concentrating ?  ?Psychosis:   ?None ?  ?Duration of Psychotic symptoms:    ?Trauma:   ?None ?  ?Obsessions:   ?None ?  ?Compulsions:   ?None ?  ?Inattention:   ?None ?  ?Hyperactivity/Impulsivity:   ?None ?  ?Oppositional/Defiant Behaviors:   ?None ?  ?Emotional Irregularity:   ?None ?  ?Other Mood/Personality Symptoms:  No data recorded  ? ?Mental Status Exam ?Appearance and self-care  ?Stature:   ?Average ?  ?Weight:   ?Average weight ?  ?Clothing:   ?Neat/clean; Age-appropriate ?  ?Grooming:   ?Normal ?  ?Cosmetic use:   ?None ?  ?Posture/gait:   ?Tense ?  ?Motor activity:   ?Agitated ?  ?Sensorium  ?Attention:   ?Distractible ?  ?Concentration:   ?Variable ?  ?Orientation:   ?Person; Place; Situation ?  ?Recall/memory:   ?Defective in Recent ?  ?Affect and Mood  ?Affect:   ?Anxious ?  ?Mood:   ?Anxious; Depressed ?  ?Relating  ?  Eye contact:   ?Fleeting ?  ?Facial expression:   ?Anxious; Fearful ?  ?Attitude toward examiner:   ?Cooperative ?  ?Thought and Language  ?Speech flow:  ?Soft ?  ?Thought content:   ?Appropriate to Mood and Circumstances ?  ?Preoccupation:   ?Suicide ?  ?Hallucinations:   ?Auditory ?  ?Organization:  No data recorded  ?Executive Functions  ?Fund of Knowledge:   ?[place] ?  ?Intelligence:   ?Average ?  ?Abstraction:   ?Normal ?  ?Judgement:   ?Fair ?  ?Reality Testing:   ?Variable ?  ?Insight:   ?Fair ?  ?Decision Making:   ?Normal ?  ?Social Functioning  ?Social Maturity:   ?Responsible ?  ?Social Judgement:   ?Victimized ?  ?Stress  ?Stressors:   ?Grief/losses; Housing; Transitions; Financial ?  ?Coping Ability:   ?Exhausted ?  ?Skill Deficits:   ?None ?  ?Supports:   ?Friends/Service system ?   ? ? ?Religion: ?Religion/Spirituality ?Are You A Religious Person?: No ? ?Leisure/Recreation: ?Leisure / Recreation ?Do You Have Hobbies?: No ? ?Exercise/Diet: ?Exercise/Diet ?Do You Exercise?: No ?Have You Gained or Lost A Significa

## 2022-04-21 NOTE — ED Notes (Addendum)
Pt admitted to [facility] endorsing passive SI upon arrival to unit, no plan. Denies drug use. Homeless. UDS negative. Pt states, "I need to get back on my medication. Pt appears intoxicated with mild slurred speech but able to complete assessment and ambulate unassisted to unit. Pt oriented to unit and unit rules. Verbally contract for safety. Will monitor for safety. ?

## 2022-04-21 NOTE — ED Notes (Signed)
Pt is in the bed sleeping. Respirations are even and unlabored. No acute distress noted. Will continue to monitor for safety. 

## 2022-04-21 NOTE — ED Triage Notes (Signed)
Pt presents to [facility] voluntarily escorted by [employer] with SI and with no plan. Pt states that he has been off of his medication for 1 week. Pt holding his head down and speaking with a low tone. Pt denies HI, and AVH currently. Pt denies drug or alcohol use. ?

## 2022-04-21 NOTE — ED Notes (Addendum)
Rechecked pt's BP 160/110 manually to L arm. Matthew Look., NP notified. Scheduled medication given for BP maintenance. Pt asymptomatic. Resting on chair  ?

## 2022-04-21 NOTE — ED Notes (Signed)
Pt eating dinner in dinning room ?

## 2022-04-21 NOTE — Progress Notes (Signed)
RN called [facility] main lab, specimens did arrive at the lab, not resulted at this time. ?

## 2022-04-21 NOTE — Progress Notes (Signed)
Patient resting while waiting on PCR to resolve to be transferred to the [facility] unit. ?

## 2022-04-22 MED ORDER — SACUBITRIL-VALSARTAN 97-103 MG PO TABS
1.0000 | ORAL_TABLET | Freq: Once | ORAL | Status: AC
  Administered 2022-04-22: 1 via ORAL
  Filled 2022-04-22: qty 1

## 2022-04-22 MED ORDER — SACUBITRIL-VALSARTAN 97-103 MG PO TABS
1.0000 | ORAL_TABLET | Freq: Two times a day (BID) | ORAL | Status: DC
  Filled 2022-04-22 (×3): qty 1

## 2022-04-22 MED ORDER — POTASSIUM CHLORIDE CRYS ER 20 MEQ PO TBCR
20.0000 meq | EXTENDED_RELEASE_TABLET | Freq: Once | ORAL | Status: AC
  Administered 2022-04-22: 20 meq via ORAL
  Filled 2022-04-22: qty 1

## 2022-04-22 MED ORDER — SACUBITRIL-VALSARTAN 97-103 MG PO TABS
1.0000 | ORAL_TABLET | Freq: Two times a day (BID) | ORAL | Status: DC
  Filled 2022-04-22 (×2): qty 1

## 2022-04-22 MED ORDER — SACUBITRIL-VALSARTAN 97-103 MG PO TABS
1.0000 | ORAL_TABLET | Freq: Two times a day (BID) | ORAL | Status: DC
  Administered 2022-04-23 – 2022-04-25 (×6): 1 via ORAL
  Filled 2022-04-22 (×5): qty 1
  Filled 2022-04-22: qty 28
  Filled 2022-04-22 (×2): qty 1

## 2022-04-22 MED ORDER — SACUBITRIL-VALSARTAN 49-51 MG PO TABS
1.0000 | ORAL_TABLET | Freq: Once | ORAL | Status: AC
  Administered 2022-04-22: 1 via ORAL
  Filled 2022-04-22: qty 1

## 2022-04-22 NOTE — ED Notes (Signed)
Pt bp elevated 171/102. Provider notified. Bp medications given. Will continue to monitor for safety. ?

## 2022-04-22 NOTE — ED Notes (Signed)
Pt laying in room resting in no acute distress. RR even and unlabored. Safety maintained. ?

## 2022-04-22 NOTE — ED Notes (Signed)
Pt is in the bed resting. Respirations are even and unlabored. No acute distress noted. Will continue to monitor for safety 

## 2022-04-22 NOTE — Discharge Instructions (Addendum)
Take all medications as prescribed by his/her mental healthcare provider. ?Report any adverse effects and or reactions from the medicines to your outpatient provider promptly. ?Do not engage in alcohol and or illegal drug use while on prescription medicines. ?In the event of worsening symptoms, call the crisis hotline, 911 and or go to the nearest ED for appropriate evaluation and treatment of symptoms. ?follow-up with your primary care provider for your other medical issues, concerns and or health care needs. ? ? ?Please come to [facility] (this facility) during walk in hours for appointment with psychiatrist/provider for further medication management and for therapists for therapy.  ? ? Walk-Ins for medication management  are available on Monday, Wednesday, Thursday and Friday from 8am-11am.  It is first come, first -serve; it is best to arrive by 7:00 AM.  ? ? Walk-Ins for therapy are  available on Monday and Wednesday?s  8am-11am.  It is first come, first -serve; it is best to arrive by 7:00 AM.  ? ? ?When you arrive please go upstairs for your appointment. If you are unsure of where to go, inform the front desk that you are here for a walk in appointment and they will assist you with directions upstairs. ? ?Address:  ?[address] in [place], [place] ?Ph: [phone]  ? ? ? ? ? ?

## 2022-04-22 NOTE — ED Provider Notes (Signed)
Behavioral Health Progress Note ? ?Date and Time: 04/22/2022 11:42 AM ?Name: Matthew [name]. ?MRN:  [id number] ? ?Subjective:  Matthew [name] 56 y.o., male patient who initially presented to [facility] via [facility] due to SI.  He was admitted to the [facility] for crisis stabilization.  ? ?Matthew [name]., 56 y.o., male patient seen face to face by this provider, consulted with Dr. Lovette Cliche; and chart reviewed on 04/22/22. ? 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 ?During evaluation Matthew [name]. is laying in his bed asleep.  He is easily awakened.  He is alert/oriented x4.  He has normal speech and behavior.  He is cooperative and makes good eye contact.  He endorses decreased sleep and appetite.  He endorses increased anxiety and depression with feelings of hopelessness, increased agitation, low motivation, hopelessness, and decreased focus.  He denies any recent stressors/triggers but states being unemployed and being homeless definitely contributes to his depression.  He endorses suicidal ideations with a plan to cut his wrist.  He cannot contract for safety and when asked if he has access to knives he states, "everybody has access to knives".  He denies ever having a suicide attempt.  He denies homicidal ideations.  He endorses auditory hallucinations of hearing a male voice that tells him to kill himself.  He denies visual hallucinations.  Objectively he does not appear to be responding to internal/external stimuli.  He denies paranoia or delusional thought.  He denies any recent use related to alcohol.  Denies any seizures or DTs related to alcohol  withdrawal.  ? ?Discussed elevated blood pressure.  Patient denies any chest pain or shortness of breath.  He endorses some blurred vision in the a.m.  States he has experienced  blurred vision from time to time. He endorses a slight headache. Will have nursing recheck bp.  ? ? ? ?Diagnosis:  ?Final diagnoses:  ?Severe episode of recurrent major depressive disorder, without psychotic features ([place])  ? ? ?Total Time spent with patient: 30 minutes ? ?Past Psychiatric History: see h&p ?Past Medical History:  ?Past Medical History:  ?Diagnosis Date  ? Acute combined systolic and diastolic CHF, NYHA class 3 ([place])   ? a. 02/2014 Echo: EF 25-30%.  ? Cardiomyopathy ([place])   ? a. 02/2014 Echo: EF 25-30%, sev glob HK with inferolat HK->AK, mod conc LVH, Gr 2 DD, Mild MR, sev dil LA.  ? Depression   ? DM (diabetes mellitus) ([place])   ? High cholesterol   ? Hypertension   ? Morbid obesity ([place])   ? Tobacco abuse   ?  ?Past Surgical History:  ?Procedure Laterality Date  ? CATARACT EXTRACTION Right   ? LEFT HEART CATHETERIZATION WITH CORONARY ANGIOGRAM N/A 03/04/2014  ? Procedure: LEFT HEART CATHETERIZATION WITH CORONARY ANGIOGRAM;  Surgeon: Burnell Blanks, MD;  Location[facility] CATH LAB;  Service: Cardiovascular;  Laterality: N/A;  ? TEE WITHOUT CARDIOVERSION N/A 02/04/2017  ? Procedure: TRANSESOPHAGEAL ECHOCARDIOGRAM (TEE);  Surgeon: Dorothy Spark, MD;  Location: [place];  Service: Cardiovascular;  Laterality: N/A;  ? ?Family History:  ?Family History  ?  Problem Relation Age of Onset  ? Heart attack Father   ?     died @ 25  ? Cervical cancer Mother   ?     died @ 75  ? ?Family Psychiatric  History: See H&P ?Social History:  ?Social History  ? ?Substance and Sexual Activity  ?Alcohol Use No  ?   ?Social History  ? ?Substance and Sexual Activity  ?Drug Use No  ?  ?Social History  ? ?Socioeconomic History  ? Marital status: Widowed  ?  Spouse name: Not on file  ? Number of children: 2  ? Years of education: 37  ? Highest  education level: Not on file  ?Occupational History  ? Occupation: [employer]  ?Tobacco Use  ? Smoking status: Former  ?  Years: 20.00  ?  Types: Cigarettes  ? Smokeless tobacco: Former  ?  Quit date: 11/14/2020  ?Vaping Use  ? Vaping Use: Never used  ?Substance and Sexual Activity  ? Alcohol use: No  ? Drug use: No  ? Sexual activity: Yes  ?Other Topics Concern  ? Not on file  ?Social History Narrative  ? Lives with 2 young children in [place].  Wife died in 14-May-2020.  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  ? Left-handed  ? Caffeine: occasional tea  ? ?Social Determinants of Health  ? ?Financial Resource Strain: Not on file  ?Food Insecurity: Not on file  ?Transportation Needs: Not on file  ?Physical Activity: Not on file  ?Stress: Not on file  ?Social Connections: Not on file  ? ?SDOH:  ?SDOH Screenings  ? ?Alcohol Screen: Low Risk   ? Last Alcohol Screening Score (AUDIT): 0  ?Depression (PHQ2-9): Not on file  ?Financial Resource Strain: Not on file  ?Food Insecurity: Not on file  ?Housing: Not on file  ?Physical Activity: Not on file  ?Social Connections: Not on file  ?Stress: Not on file  ?Tobacco Use: Medium Risk  ? Smoking Tobacco Use: Former  ? Smokeless Tobacco Use: Former  ? Passive Exposure: Not on file  ?Transportation Needs: Not on file  ? ?Additional Social History:  ?  ?Pain Medications: See MAR ?Prescriptions: See MAR ?Over the Counter: See MAR ?History of alcohol / drug use?: Yes ?Longest period of sobriety (when/how long): Unknown ?Negative Consequences of Use:  (Pt denies) ?Withdrawal Symptoms: None ?Name of Substance 1: Cocaine per hx although patient denies recent use ?1 - Age of First Use: Unknown ?1 - Amount (size/oz): UTA ?1 - Frequency: UTA ?1 - Duration: UTA ?1 - Last Use / Amount: UTA ?1- Route of Use: Per chart smoking ?Name of Substance 2: Alcohol per chart although pt denies recent use ?2 - Age of First Use: Unknown ?2 - Amount (size/oz): UTA ?2 - Frequency:  UTA ?2 - Duration: UTA ?2 - Last Use / Amount: UTA ?2 - Route of Substance Use: Oral ?  ?  ?  ?  ?  ?  ?  ? ?Sleep: Fair ? ?Appetite:  Fair ? ?Current Medications:  ?Current Facility-Administered Medications  ?Medication Dose Route Frequency Provider Last Rate Last Admin  ? acetaminophen (TYLENOL) tablet 650 mg  650 mg Oral Q6H PRN D[place]ill [place], NP      ? alum & mag hydroxide-simeth (MAALOX/MYLANTA) 200-200-20 MG/5ML suspension 30 mL  30 mL Oral Q4H PRN Derrill Center, NP      ? aspirin EC tablet 81 mg  81 mg Oral  Daily Derrill Center, NP   81 mg at 04/22/22 0272  ? atorvastatin (LIPITOR) tablet 80 mg  80 mg Oral Daily Derrill Center, NP   80 mg at 04/22/22 5366  ? ezetimibe (ZETIA) tablet 10 mg  10 mg Oral Daily Derrill Center, NP   10 mg at 04/22/22 4403  ? furosemide (LASIX) tablet 40 mg  40 mg Oral Daily Derrill Center, NP   40 mg at 04/22/22 4742  ? gabapentin (NEURONTIN) capsule 100 mg  100 mg Oral TID Derrill Center, NP   100 mg at 04/22/22 5956  ? hydrOXYzine (ATARAX) tablet 25 mg  25 mg Oral TID PRN Derrill Center, NP      ? isosorbide-hydrALAZINE (BIDIL) 20-37.5 MG per tablet 1 tablet  1 tablet Oral TID Derrill Center, NP   1 tablet at 04/22/22 3875  ? magnesium hydroxide (MILK OF MAGNESIA) suspension 30 mL  30 mL Oral Daily PRN Derrill Center, NP      ? OLANZapine (ZYPREXA) tablet 2.5 mg  2.5 mg Oral Daily Derrill Center, NP   2.5 mg at 04/22/22 0906  ? OLANZapine (ZYPREXA) tablet 7.5 mg  7.5 mg Oral QHS Derrill Center, NP   7.5 mg at 04/21/22 2113  ? sacubitril-valsartan (ENTRESTO) 97-103 mg per tablet  1 tablet Oral BID Revonda Humphrey, NP      ? sertraline (ZOLOFT) tablet 75 mg  75 mg Oral Daily Derrill Center, NP   75 mg at 04/22/22 6433  ? spironolactone (ALDACTONE) tablet 25 mg  25 mg Oral Daily Derrill Center, NP   25 mg at 04/22/22 0905  ? timolol (TIMOPTIC) 0.5 % ophthalmic solution 1 drop  1 drop Both Eyes QPC breakfast Derrill [place], NP   1 drop at 04/22/22 0905  ? ?Current  Outpatient Medications  ?Medication Sig Dispense Refill  ? furosemide (LASIX) 40 MG tablet Take 1 tablet (40 mg total) by mouth daily. 30 tablet 2  ? isosorbide-hydrALAZINE (BIDIL) 20-37.5 MG tablet Take 1

## 2022-04-22 NOTE — ED Notes (Signed)
Elevated BP 140/108 reported to Katharine Look., NP. Pt finished meal, now laying down resting. No acute distress noted. Will continue to monitor for safety. ?

## 2022-04-22 NOTE — ED Notes (Signed)
Pt received 1 x dose of BP medication (Entresto). Tolerated well. Safety maintained. ?

## 2022-04-22 NOTE — ED Notes (Signed)
MHT alerted RN to pt's BP reading 136/107 on dinamap. RN checked BP manually 140/108. Matthew [name]., NP, notified with recommendation for possible Clonidine. Pt denies sx at present. Awaiting orders. ?

## 2022-04-22 NOTE — ED Notes (Signed)
Pt sleeping'@this'$  time. Breathing even ad unlabored. Will continue to monitor for safety ?

## 2022-04-22 NOTE — ED Notes (Signed)
Administered BP med per NP orders. Pt tolerated well. Will continue to monitor for safety. ?

## 2022-04-22 NOTE — ED Notes (Signed)
Pt is in the bed sleeping. Respirations are even and unlabored. No acute distress noted. Will continue to monitor for safety. 

## 2022-04-22 NOTE — ED Notes (Signed)
Pt sleeping in no acute distress. RR even and unlabored. Safety maintained. 

## 2022-04-22 NOTE — ED Notes (Signed)
Continue to endorse passive SI no plan. Pt states, "I just think about my life and get sad". Denies HI/AVH. Encouragement provided by staff. Received all am medication without difficulty. Bkft eaten. Informed pt to notify staff if impulses or urges to harm self or others arise. Pt verbalized understanding and agreement. Will continue to monitor for safety. ?

## 2022-04-23 LAB — COMPREHENSIVE METABOLIC PANEL
ALT: 32 U/L (ref 0–44)
AST: 23 U/L (ref 15–41)
Albumin: 3.4 g/dL — ABNORMAL LOW (ref 3.5–5.0)
Alkaline Phosphatase: 58 U/L (ref 38–126)
Anion gap: 10 (ref 5–15)
BUN: 7 mg/dL (ref 6–20)
CO2: 26 mmol/L (ref 22–32)
Calcium: 9 mg/dL (ref 8.9–10.3)
Chloride: 103 mmol/L (ref 98–111)
Creatinine, Ser: 1.23 mg/dL (ref 0.61–1.24)
GFR, Estimated: >60 mL/min (ref >60)
Glucose, Bld: 189 mg/dL — ABNORMAL HIGH (ref 70–99)
Potassium: 3.6 mmol/L (ref 3.5–5.1)
Sodium: 139 mmol/L (ref 135–145)
Total Bilirubin: 1 mg/dL (ref 0.3–1.2)
Total Protein: 6.7 g/dL (ref 6.5–8.1)

## 2022-04-23 LAB — MAGNESIUM: Magnesium: 1.7 mg/dL (ref 1.7–2.4)

## 2022-04-23 MED ORDER — MELATONIN 5 MG PO TABS
5.0000 mg | ORAL_TABLET | Freq: Every day | ORAL | Status: DC
Start: 2022-04-23 — End: 2022-04-26
  Administered 2022-04-23 – 2022-04-25 (×3): 5 mg via ORAL
  Filled 2022-04-23: qty 1
  Filled 2022-04-23: qty 14
  Filled 2022-04-23 (×2): qty 1

## 2022-04-23 MED ORDER — SERTRALINE HCL 100 MG PO TABS
100.0000 mg | ORAL_TABLET | Freq: Every day | ORAL | Status: DC
  Administered 2022-04-24 – 2022-04-25 (×2): 100 mg via ORAL
  Filled 2022-04-23 (×3): qty 1
  Filled 2022-04-23: qty 14

## 2022-04-23 MED ORDER — HYDRALAZINE HCL 25 MG PO TABS
25.0000 mg | ORAL_TABLET | Freq: Once | ORAL | Status: AC
  Administered 2022-04-23: 25 mg via ORAL
  Filled 2022-04-23: qty 1

## 2022-04-23 NOTE — ED Notes (Signed)
Pt  Bp elevated, provider notified. Will continue to monitor for safety. ?

## 2022-04-23 NOTE — ED Notes (Signed)
Pt in bed laying down calm and cooperative. No c/o pain or distress. Will continue to monitor for safety ?

## 2022-04-23 NOTE — ED Notes (Signed)
Pt is in the bed sleeping. Respirations are even and unlabored. No acute distress noted. Will continue to monitor for safety, ?

## 2022-04-23 NOTE — Progress Notes (Signed)
Patient has remained in his room with the exception of meals.  He has been calm, quiet, and without complaint or request.  Patient encouraged to seek out staff to have needs met.  He presently denies avh shi or plan.  No evidence of withdrawal.  Will monitor and provide safe environment.   ?

## 2022-04-23 NOTE — ED Provider Notes (Signed)
Behavioral Health Progress Note ? ?Date and Time: 04/23/2022 12:45 PM ?Name: Matthew [name]. ?MRN:  [id number] ? 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 He was admitted to the [facility] for crisis stabilization and restarted on his home medications. Uds negative; etoh negative ? ?Patient seen and chart reviewed. He has been medication compliant and has been appropriate with staff and peers on the unit.  Patient interviewed in his room this morning.  He is calm, cooperative and pleasant on interview this morning.  Patient states that he presented to the [facility]  for "anxiety attacks and depression".  He states that his anxiety attacks do not occur  often and are  provoked by "my homeless situation".  He describes his anxiety attacks as not being unable to breathe.  Patient states that he was staying in a shelter prior to presentation but that "I did not like it at all".  He describes having to share space with too many individuals.  Patient states that he is supposed to be getting housing and requests access to his phone in order to contact the housing authority who is going to be responsible for this.  Patient recounts previous admission at [facility] and states that he has been medication compliant since his discharge. ? ?He denies SI/HI/AVH.  He states that he did not sleep well last night and denies any issues with his appetite.  Discussed medications-he is agreeable to increasing Zoloft to 100 mg daily. Patient was given the opportunity to ask questions and  All questions answered. Patient verbalized understanding regarding plan of care.  ? ? ?Diagnosis:   ?Final diagnoses:  ?Severe episode of recurrent major depressive disorder, without psychotic features ([place])  ? ? ?Total Time spent with patient: 30 minutes ? ?Past Psychiatric History: MDD with psychosis ?Past Medical History:  ?Past Medical History:  ?Diagnosis Date  ? Acute combined systolic and diastolic CHF, NYHA class 3 ([place])   ? a. 02/2014 Echo: EF 25-30%.  ? Cardiomyopathy ([place])   ? a. 02/2014 Echo: EF 25-30%, sev glob HK with inferolat HK->AK, mod conc LVH, Gr 2 DD, Mild MR, sev dil LA.  ? Depression   ? DM (diabetes mellitus) ([place])   ? High cholesterol   ? Hypertension   ? Morbid obesity ([place])   ? Tobacco abuse   ?  ?Past Surgical History:  ?Procedure Laterality Date  ? CATARACT EXTRACTION Right   ? LEFT HEART CATHETERIZATION WITH CORONARY ANGIOGRAM N/A 03/04/2014  ? Procedure: LEFT HEART CATHETERIZATION WITH CORONARY ANGIOGRAM;  Surgeon: Burnell Blanks, MD;  Location: [facility] CATH LAB;  Service: Cardiovascular;  Laterality: N/A;  ? TEE WITHOUT CARDIOVERSION N/A 02/04/2017  ? Procedure: TRANSESOPHAGEAL ECHOCARDIOGRAM (TEE);  Surgeon: Dorothy Spark, MD;  Location: [place];  Service: Cardiovascular;  Laterality: N/A;  ? ?Family History:  ?Family History  ?Problem Relation Age of Onset  ? Heart attack Father   ?     died @ 74  ? Cervical cancer Mother   ?     died @ 41  ? ?Family Psychiatric  History: denies ?Social History:  ?Social History  ? ?  Substance and Sexual Activity  ?Alcohol Use No  ?   ?Social History  ? ?Substance and Sexual Activity  ?Drug Use No  ?  ?Social History  ? ?Socioeconomic History  ? Marital status: Widowed  ?  Spouse name: Not on file  ? Number of children: 2  ? Years of education: 59  ? Highest education level: Not on file  ?Occupational History  ? Occupation: [employer]  ?Tobacco Use  ? Smoking status: Former  ?  Years: 20.00  ?  Types: Cigarettes  ? Smokeless tobacco: Former  ?  Quit date: 11/14/2020  ?Vaping Use  ? Vaping Use: Never used  ?Substance and Sexual Activity   ? Alcohol use: No  ? Drug use: No  ? Sexual activity: Yes  ?Other Topics Concern  ? Not on file  ?Social History Narrative  ? Lives with 2 young children in [place].  Wife died in 22-May-2020.  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  ? Left-handed  ? Caffeine: occasional tea  ? ?Social Determinants of Health  ? ?Financial Resource Strain: Not on file  ?Food Insecurity: Not on file  ?Transportation Needs: Not on file  ?Physical Activity: Not on file  ?Stress: Not on file  ?Social Connections: Not on file  ? ?SDOH:  ?SDOH Screenings  ? ?Alcohol Screen: Low Risk   ? Last Alcohol Screening Score (AUDIT): 0  ?Depression (PHQ2-9): Medium Risk  ? PHQ-2 Score: 13  ?Financial Resource Strain: Not on file  ?Food Insecurity: Not on file  ?Housing: Not on file  ?Physical Activity: Not on file  ?Social Connections: Not on file  ?Stress: Not on file  ?Tobacco Use: Medium Risk  ? Smoking Tobacco Use: Former  ? Smokeless Tobacco Use: Former  ? Passive Exposure: Not on file  ?Transportation Needs: Not on file  ? ?Additional Social History:  ?  ?Pain Medications: See MAR ?Prescriptions: See MAR ?Over the Counter: See MAR ?History of alcohol / drug use?: Yes ?Longest period of sobriety (when/how long): Unknown ?Negative Consequences of Use:  (Pt denies) ?Withdrawal Symptoms: None ?Name of Substance 1: Cocaine per hx although patient denies recent use ?1 - Age of First Use: Unknown ?1 - Amount (size/oz): UTA ?1 - Frequency: UTA ?1 - Duration: UTA ?1 - Last Use / Amount: UTA ?1- Route of Use: Per chart smoking ?Name of Substance 2: Alcohol per chart although pt denies recent use ?2 - Age of First Use: Unknown ?2 - Amount (size/oz): UTA ?2 - Frequency: UTA ?2 - Duration: UTA ?2 - Last Use / Amount: UTA ?2 - Route of Substance Use: Oral ?  ?  ?  ?  ?  ?  ?  ? ?Sleep: Poor ? ?Appetite:  Good ? ?Current Medications:  ?Current Facility-Administered Medications  ?Medication Dose Route Frequency Provider Last Rate Last  Admin  ? acetaminophen (TYLENOL) tablet 650 mg  650 mg Oral Q6H PRN Derrill Center, NP      ? alum & mag hydroxide-simeth (MAALOX/MYLANTA) 200-200-20 MG/5ML suspension 30 mL  30 mL Oral Q4H PRN Derrill Center, NP      ? aspirin EC tablet 81 mg  81 mg Oral Daily Derrill Center, NP   81 mg at 04/23/22 1005  ? atorvastatin (LIPITOR) tablet 80 mg  80 mg Oral Daily Derrill Center, NP   80 mg at 04/23/22 1002  ? ezetimibe (ZETIA) tablet 10 mg  10 mg Oral  Daily Derrill Center, NP   10 mg at 04/23/22 1002  ? furosemide (LASIX) tablet 40 mg  40 mg Oral Daily Derrill Center, NP   40 mg at 04/23/22 1002  ? gabapentin (NEURONTIN) capsule 100 mg  100 mg Oral TID Derrill Center, NP   100 mg at 04/23/22 1001  ? hydrOXYzine (ATARAX) tablet 25 mg  25 mg Oral TID PRN Derrill Center, NP      ? isosorbide-hydrALAZINE (BIDIL) 20-37.5 MG per tablet 1 tablet  1 tablet Oral TID Derrill Center, NP   1 tablet at 04/23/22 1005  ? magnesium hydroxide (MILK OF MAGNESIA) suspension 30 mL  30 mL Oral Daily PRN Derrill Center, NP      ? sacubitril-valsartan (ENTRESTO) 97-103 mg per tablet  1 tablet Oral BID Tawnya Crook, RPH   1 tablet at 04/23/22 1002  ? [START ON 04/24/2022] sertraline (ZOLOFT) tablet 100 mg  100 mg Oral Daily Ival Bible, MD      ? spironolactone (ALDACTONE) tablet 25 mg  25 mg Oral Daily Derrill Center, NP   25 mg at 04/23/22 1002  ? timolol (TIMOPTIC) 0.5 % ophthalmic solution 1 drop  1 drop Both Eyes QPC breakfast Derrill Center, NP   1 drop at 04/23/22 0825  ? ?Current Outpatient Medications  ?Medication Sig Dispense Refill  ? furosemide (LASIX) 40 MG tablet Take 1 tablet (40 mg total) by mouth daily. 30 tablet 2  ? isosorbide-hydrALAZINE (BIDIL) 20-37.5 MG tablet Take 1 tablet by mouth 3 (three) times daily. 270 tablet 3  ? sacubitril-valsartan (ENTRESTO) 49-51 MG Take 1 tablet by mouth 2 (two) times daily.    ? aspirin EC 81 MG EC tablet Take 1 tablet (81 mg total) by mouth daily. Swallow whole.  (Patient not taking: Reported on 04/21/2022) 30 tablet 0  ? atorvastatin (LIPITOR) 80 MG tablet Take 1 tablet (80 mg total) by mouth daily. (Patient not taking: Reported on 04/21/2022) 30 tablet 2  ? ezetimibe (ZET

## 2022-04-23 NOTE — ED Notes (Signed)
Blood specimens collected via R A/C x 1 stick, Pt tolerated well.  ?

## 2022-04-23 NOTE — ED Notes (Signed)
Patient wanted to go over the booklet I made for them, Matthew [name], it had 8 Dimensions of Wellness along with worksheets, it also covered Nutritional Wellness and the connection between brain and food and how food affects the way you feel. Self-Care and Healthy Sleeping Habits along with worksheets. ?

## 2022-04-24 ENCOUNTER — Other Ambulatory Visit (HOSPITAL_COMMUNITY): Payer: Self-pay

## 2022-04-24 NOTE — ED Provider Notes (Signed)
Behavioral Health Progress Note ? ?Date and Time: 04/24/2022 11:56 AM ?Name: Matthew [name]. ?MRN:  [id number] ? 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 He was admitted to the [facility] for crisis stabilization and restarted on his home medications. Uds negative; etoh negative ? ?Patient seen and chart reviewed. He has been medication compliant and has been appropriate with staff and peers on the unit.  Patient interviewed in his room this morning.  He is calm, cooperative and pleasant on interview this morning. He describes his mood as "ok"; he denies SI/HI/AVH. He reports sleeping well and denies issues with appetite. He denies all physical complaints apart from dizziness which he experienced for the first time this morning. He describes feeling dizzy when going from laying to sitting up/standing quickly and describes dizziness resolving after a couple minutes. Discussed orthostatic hypotension and education provided. Patient verbalized understanding. Patient states that he spoke to LCSW this morning and is waiting to hear back from her as he requested that she contact [facility] in regards to housing.  ? ? ? ?Diagnosis:  ?Final diagnoses:  ?Severe episode of recurrent major depressive disorder, without psychotic features ([place])  ? ? ?Total Time spent with patient: 15 minutes ? ?Past Psychiatric History: MDD with psychosis ?Past Medical History:  ?Past Medical History:  ?Diagnosis Date  ? Acute combined systolic and diastolic CHF, NYHA class 3 ([place])   ? a. 02/2014 Echo: EF 25-30%.  ? Cardiomyopathy ([place])   ? a. 02/2014 Echo: EF 25-30%, sev glob HK with inferolat HK->AK,  mod conc LVH, Gr 2 DD, Mild MR, sev dil LA.  ? Depression   ? DM (diabetes mellitus) ([place])   ? High cholesterol   ? Hypertension   ? Morbid obesity ([place])   ? Tobacco abuse   ?  ?Past Surgical History:  ?Procedure Laterality Date  ? CATARACT EXTRACTION Right   ? LEFT HEART CATHETERIZATION WITH CORONARY ANGIOGRAM N/A 03/04/2014  ? Procedure: LEFT HEART CATHETERIZATION WITH CORONARY ANGIOGRAM;  Surgeon: Burnell Blanks, MD;  Location: [facility] CATH LAB;  Service: Cardiovascular;  Laterality: N/A;  ? TEE WITHOUT CARDIOVERSION N/A 02/04/2017  ? Procedure: TRANSESOPHAGEAL ECHOCARDIOGRAM (TEE);  Surgeon: Dorothy Spark, MD;  Location: [place];  Service: Cardiovascular;  Laterality: N/A;  ? ?Family History:  ?Family History  ?Problem Relation Age of Onset  ? Heart attack Father   ?     died @ 71  ? Cervical cancer Mother   ?     died @ 52  ? ?Family Psychiatric  History: denies ?Social History:  ?Social History  ? ?Substance and Sexual Activity  ?Alcohol Use No  ?   ?Social History  ? ?Substance and Sexual Activity  ?Drug Use No  ?  ?Social History  ? ?Socioeconomic History  ? Marital status: Widowed  ?  Spouse name: Not on file  ? Number of children: 2  ? Years of education: 40  ? Highest education level: Not on file  ?Occupational History  ? Occupation: [employer]  ?Tobacco Use  ? Smoking status: Former  ?  Years: 20.00  ?  Types:  Cigarettes  ? Smokeless tobacco: Former  ?  Quit date: 11/14/2020  ?Vaping Use  ? Vaping Use: Never used  ?Substance and Sexual Activity  ? Alcohol use: No  ? Drug use: No  ? Sexual activity: Yes  ?Other Topics Concern  ? Not on file  ?Social History Narrative  ? Lives with 2 young children in [place].  Wife died in 17-May-2020.  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  ? Left-handed  ? Caffeine: occasional tea  ? ?Social Determinants of Health  ? ?Financial Resource Strain: Not on file  ?Food Insecurity: Not on file  ?Transportation Needs: Not on file  ?Physical  Activity: Not on file  ?Stress: Not on file  ?Social Connections: Not on file  ? ?SDOH:  ?SDOH Screenings  ? ?Alcohol Screen: Low Risk   ? Last Alcohol Screening Score (AUDIT): 0  ?Depression (PHQ2-9): Medium Risk  ? PHQ-2 Score: 13  ?Financial Resource Strain: Not on file  ?Food Insecurity: Not on file  ?Housing: Not on file  ?Physical Activity: Not on file  ?Social Connections: Not on file  ?Stress: Not on file  ?Tobacco Use: Medium Risk  ? Smoking Tobacco Use: Former  ? Smokeless Tobacco Use: Former  ? Passive Exposure: Not on file  ?Transportation Needs: Not on file  ? ?Additional Social History:  ?  ?Pain Medications: See MAR ?Prescriptions: See MAR ?Over the Counter: See MAR ?History of alcohol / drug use?: Yes ?Longest period of sobriety (when/how long): Unknown ?Negative Consequences of Use:  (Pt denies) ?Withdrawal Symptoms: None ?Name of Substance 1: Cocaine per hx although patient denies recent use ?1 - Age of First Use: Unknown ?1 - Amount (size/oz): UTA ?1 - Frequency: UTA ?1 - Duration: UTA ?1 - Last Use / Amount: UTA ?1- Route of Use: Per chart smoking ?Name of Substance 2: Alcohol per chart although pt denies recent use ?2 - Age of First Use: Unknown ?2 - Amount (size/oz): UTA ?2 - Frequency: UTA ?2 - Duration: UTA ?2 - Last Use / Amount: UTA ?2 - Route of Substance Use: Oral ?  ?  ?  ?  ?  ?  ?  ? ?Sleep: Poor ? ?Appetite:  Good ? ?Current Medications:  ?Current Facility-Administered Medications  ?Medication Dose Route Frequency Provider Last Rate Last Admin  ? acetaminophen (TYLENOL) tablet 650 mg  650 mg Oral Q6H PRN Derrill Center, NP      ? alum & mag hydroxide-simeth (MAALOX/MYLANTA) 200-200-20 MG/5ML suspension 30 mL  30 mL Oral Q4H PRN Derrill Center, NP      ? aspirin EC tablet 81 mg  81 mg Oral Daily Derrill Center, NP   81 mg at 04/24/22 1016  ? atorvastatin (LIPITOR) tablet 80 mg  80 mg Oral Daily Derrill Center, NP   80 mg at 04/24/22 1016  ? ezetimibe (ZETIA) tablet 10 mg  10 mg  Oral Daily Derrill Center, NP   10 mg at 04/24/22 1017  ? furosemide (LASIX) tablet 40 mg  40 mg Oral Daily Derrill Center, NP   40 mg at 04/24/22 1016  ? gabapentin (NEURONTIN) capsule 100 mg  100 mg Oral TID Derrill Center, NP   100 mg at 04/24/22 1016  ? hydrOXYzine (ATARAX) tablet 25 mg  25 mg Oral TID PRN Derrill Center, NP   25 mg at 04/23/22 2108  ? isosorbide-hydrALAZINE (BIDIL) 20-37.5 MG per tablet 1  tablet  1 tablet Oral TID Derrill Center, NP   1 tablet at 04/24/22 1017  ? magnesium hydroxide (MILK OF MAGNESIA) suspension 30 mL  30 mL Oral Daily PRN Derrill Center, NP      ? melatonin tablet 5 mg  5 mg Oral QHS Ival Bible, MD   5 mg at 04/23/22 2108  ? sacubitril-valsartan (ENTRESTO) 97-103 mg per tablet  1 tablet Oral BID Tawnya Crook, RPH   1 tablet at 04/24/22 1017  ? sertraline (ZOLOFT) tablet 100 mg  100 mg Oral Daily Ival Bible, MD   100 mg at 04/24/22 1016  ? spironolactone (ALDACTONE) tablet 25 mg  25 mg Oral Daily Derrill Center, NP   25 mg at 04/24/22 1018  ? timolol (TIMOPTIC) 0.5 % ophthalmic solution 1 drop  1 drop Both Eyes QPC breakfast Derrill Center, NP   1 drop at 04/24/22 1018  ? ?Current Outpatient Medications  ?Medication Sig Dispense Refill  ? furosemide (LASIX) 40 MG tablet Take 1 tablet (40 mg total) by mouth daily. 30 tablet 2  ? isosorbide-hydrALAZINE (BIDIL) 20-37.5 MG tablet Take 1 tablet by mouth 3 (three) times daily. 270 tablet 3  ? sacubitril-valsartan (ENTRESTO) 49-51 MG Take 1 tablet by mouth 2 (two) times daily.    ? aspirin EC 81 MG EC tablet Take 1 tablet (81 mg total) by mouth daily. Swallow whole. (Patient not taking: Reported on 04/21/2022) 30 tablet 0  ? atorvastatin (LIPITOR) 80 MG tablet Take 1 tablet (80 mg total) by mouth daily. (Patient not taking: Reported on 04/21/2022) 30 tablet 2  ? ezetimibe (ZETIA) 10 MG tablet Take 1 tablet (10 mg total) by mouth daily. (Patient not taking: Reported on 04/21/2022) 30 tablet 0  ? gabapentin  (NEURONTIN) 100 MG capsule Take 1 capsule (100 mg total) by mouth 3 (three) times daily. (Patient not taking: Reported on 04/21/2022) 30 capsule 0  ? hydrOXYzine (ATARAX) 25 MG tablet Take 1 tablet (25 mg total)

## 2022-04-24 NOTE — ED Notes (Signed)
Pt sleeping@this time. Breathing even and unlabored. Will continue to monitor for safety 

## 2022-04-24 NOTE — ED Notes (Signed)
Pt calm and cooperative. Pt in dayroom watching television and having a snack. No c/o pain or distress. Will continue to monitor for safety ?

## 2022-04-24 NOTE — Clinical Social Work Psych Note (Signed)
LCSW Initial Note ? ?LCSW met with Matthew [name] for introduction and to begin discussions regarding treatment and potential discharge planning.  ? ?Matthew [name] presented with a dysphoric affect, congruent mood, however he  denied having any SI, HI or AVH at this time. ? ?Matthew [name] shared that he initially came to the [facility] due to experiencing worsening depressive symptoms, including suicidal ideations.Matthew [name] shared that he is currently homeless. Matthew [name] reports he has been homeless since January 2023. Matthew [name] reports his home went under foreclosure and that resulted in his homelessness.  ? ?Matthew [name] shared that has been currently residing at the [facility], however does not "like" staying there due to conflicts with other residents.  ? ?Matthew [name] reports that he was placed on [employer] program wait list two weeks ago. Matthew [name] shared that he is unaware if he has been assigned a caseworker and requested LCSW assistance with obtaining more information. Matthew [name] reports that his main concern is being discharged and having no where to go. Matthew [name] reports receiving little sleep due  to constantly worrying about his living situation.  ? ? ?LCSW ensured Matthew [name] that I would follow up with [facility] to gain more information regarding his services with their [facility] program and that I would look into additional homeless resources for possible placement.  ? ?LCSW will continue to follow ? ? ?Matthew [name], MSW, LCSW ?Clinical Education officer, museum Insurance claims handler) ?[facility]  ? ?

## 2022-04-24 NOTE — ED Notes (Signed)
Pt is currently in the dining room attending AA ?

## 2022-04-24 NOTE — ED Notes (Signed)
PT has taken night time medications he is now lying quietly in bed. ?

## 2022-04-24 NOTE — Progress Notes (Signed)
Received Matthew [name] this AM asleep in his bed, he woke up on his own, received breakfast and returned to his room. Later he was medicated per order. He verbalized feeling dizzy and stated it is related to the medication. He denied feeling anxious, depressed and suicidal. ?

## 2022-04-24 NOTE — ED Notes (Signed)
Pt sleeping at present, no distress noted. Respirations even & unlabored.  Monitoring for safety. 

## 2022-04-24 NOTE — Progress Notes (Addendum)
Matthew [name] woke up for dinner, ate and  remained OOB in the milieu with his peers. He denied feeling dizzy this PM. ?

## 2022-04-25 ENCOUNTER — Encounter (HOSPITAL_COMMUNITY): Payer: Self-pay

## 2022-04-25 MED ORDER — ASPIRIN 81 MG PO TBEC: 81.0000 mg | DELAYED_RELEASE_TABLET | Freq: Every day | ORAL | 1 refills | Status: DC

## 2022-04-25 MED ORDER — ENTRESTO 97-103 MG PO TABS: 1.0000 | ORAL_TABLET | Freq: Two times a day (BID) | ORAL | 1 refills | Status: DC

## 2022-04-25 MED ORDER — MELATONIN 5 MG PO TABS: 5.0000 mg | ORAL_TABLET | Freq: Every day | ORAL | 1 refills | Status: DC

## 2022-04-25 MED ORDER — ISOSORB DINITRATE-HYDRALAZINE 20-37.5 MG PO TABS: 1.0000 | ORAL_TABLET | Freq: Three times a day (TID) | ORAL | 1 refills | Status: DC

## 2022-04-25 MED ORDER — SPIRONOLACTONE 25 MG PO TABS: 25.0000 mg | ORAL_TABLET | Freq: Every day | ORAL | 1 refills | Status: DC

## 2022-04-25 MED ORDER — HYDROXYZINE HCL 25 MG PO TABS: 25.0000 mg | ORAL_TABLET | Freq: Three times a day (TID) | ORAL | 1 refills | Status: DC | PRN

## 2022-04-25 MED ORDER — TIMOLOL MALEATE 0.5 % OP SOLN
1.0000 [drp] | Freq: Every day | OPHTHALMIC | 1 refills | Status: DC
Start: 2022-04-25 — End: 2022-07-16

## 2022-04-25 MED ORDER — GABAPENTIN 100 MG PO CAPS: 100.0000 mg | ORAL_CAPSULE | Freq: Three times a day (TID) | ORAL | 1 refills | Status: DC

## 2022-04-25 MED ORDER — EZETIMIBE 10 MG PO TABS: 10.0000 mg | ORAL_TABLET | Freq: Every day | ORAL | 1 refills | Status: DC

## 2022-04-25 MED ORDER — ATORVASTATIN CALCIUM 80 MG PO TABS: 80.0000 mg | ORAL_TABLET | Freq: Every day | ORAL | 1 refills | Status: DC

## 2022-04-25 MED ORDER — FUROSEMIDE 40 MG PO TABS: 40.0000 mg | ORAL_TABLET | Freq: Every day | ORAL | 1 refills | Status: DC

## 2022-04-25 MED ORDER — SERTRALINE HCL 100 MG PO TABS: 100.0000 mg | ORAL_TABLET | Freq: Every day | ORAL | 1 refills | Status: DC

## 2022-04-25 NOTE — ED Provider Notes (Signed)
Behavioral Health Progress Note ? ?Date and Time: 04/25/2022 2:21 PM ?Name: Matthew [name]. ?MRN:  [id number] ? 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 He was admitted to the [facility] for crisis stabilization and restarted on his home medications. Uds negative; etoh negative ? ?Patient seen and chart reviewed. He has been medication compliant and has been appropriate with staff and peers on the unit.  Patient interviewed in his room this afternoon in conjunction with LCSW.  He is calm, cooperative and pleasant on interview and describes his mood as "good". He denies SI/HI/AVH. He describes experiencing broken sleep and denies issues with appetite. Patient reports feeling dizzy after receiving blood pressure medications. Per nursing, patient had reported that he had not been compliant with his blood pressure medications after a recent admission to [facility] which had subsequently been restarted; BP remains elevated despite compliance with medications here. Discussed importance of compliance with medications and that this should resolve with time. ? ?See LCSW note for further information; patient previously was staying at [facility] in [place] and is currently homeless in [place]. He is unable to return to [place] at this time. Patient reports current homelessness as a stressor. He states that he has been working with [employer] regarding housing. LCSW has spoken to staff associated with this program --see note for details. Patient reports that he last used cocaine last week and expresses interest in residential substance use treatment if  possible. UDS from this visit was negative; however, 02/14/22 UDS+cocaine. Discussed that a referral could be made but that if he is not accepted that he would be provided with resources and discharged to the [facility] to assist with housing resources. Patient verbalizes understanding ? ? ? ?Diagnosis:  ?Final diagnoses:  ?Severe episode of recurrent major depressive disorder, without psychotic features ([place])  ? ? ?Total Time spent with patient: 15 minutes ? ?Past Psychiatric History: MDD with psychosis ?Past Medical History:  ?Past Medical History:  ?Diagnosis Date  ? Acute combined systolic and diastolic CHF, NYHA class 3 ([place])   ? a. 02/2014 Echo: EF 25-30%.  ? Cardiomyopathy ([place])   ? a. 02/2014 Echo: EF 25-30%, sev glob HK with inferolat HK->AK, mod conc LVH, Gr 2 DD, Mild MR, sev dil LA.  ? Depression   ? DM (diabetes mellitus) ([place])   ? High cholesterol   ? Hypertension   ? Morbid obesity ([place])   ? Tobacco abuse   ?  ?Past Surgical History:  ?Procedure Laterality Date  ? CATARACT EXTRACTION Right   ? LEFT HEART CATHETERIZATION WITH CORONARY ANGIOGRAM N/A 03/04/2014  ? Procedure: LEFT HEART CATHETERIZATION WITH CORONARY ANGIOGRAM;  Surgeon: Burnell Blanks, MD;  Location: [facility] CATH LAB;  Service: Cardiovascular;  Laterality: N/A;  ? TEE WITHOUT CARDIOVERSION N/A 02/04/2017  ? Procedure: TRANSESOPHAGEAL ECHOCARDIOGRAM (TEE);  Surgeon: Dorothy Spark, MD;  Location: [place];  Service: Cardiovascular;  Laterality: N/A;  ? ?Family History:  ?Family History  ?Problem Relation Age of Onset  ? Heart attack Father   ?     died @ 34  ? Cervical cancer Mother   ?  died @ 35  ? ?Family Psychiatric  History: denies ?Social History:  ?Social History  ? ?Substance and Sexual Activity  ?Alcohol Use No  ?   ?Social History  ? ?Substance and Sexual Activity  ?Drug Use No  ?  ?Social History  ? ?Socioeconomic History  ? Marital status: Widowed  ?  Spouse name: Not on file  ? Number of children: 2  ? Years of education: 25   ? Highest education level: Not on file  ?Occupational History  ? Occupation: [employer]  ?Tobacco Use  ? Smoking status: Former  ?  Years: 20.00  ?  Types: Cigarettes  ? Smokeless tobacco: Former  ?  Quit date: 11/14/2020  ?Vaping Use  ? Vaping Use: Never used  ?Substance and Sexual Activity  ? Alcohol use: No  ? Drug use: No  ? Sexual activity: Yes  ?Other Topics Concern  ? Not on file  ?Social History Narrative  ? Lives with 2 young children in [place].  Wife died in 05/06/2020.  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  ? Left-handed  ? Caffeine: occasional tea  ? ?Social Determinants of Health  ? ?Financial Resource Strain: Not on file  ?Food Insecurity: Not on file  ?Transportation Needs: Not on file  ?Physical Activity: Not on file  ?Stress: Not on file  ?Social Connections: Not on file  ? ?SDOH:  ?SDOH Screenings  ? ?Alcohol Screen: Low Risk   ? Last Alcohol Screening Score (AUDIT): 0  ?Depression (PHQ2-9): Medium Risk  ? PHQ-2 Score: 13  ?Financial Resource Strain: Not on file  ?Food Insecurity: Not on file  ?Housing: Not on file  ?Physical Activity: Not on file  ?Social Connections: Not on file  ?Stress: Not on file  ?Tobacco Use: Medium Risk  ? Smoking Tobacco Use: Former  ? Smokeless Tobacco Use: Former  ? Passive Exposure: Not on file  ?Transportation Needs: Not on file  ? ?Additional Social History:  ?  ?Pain Medications: See MAR ?Prescriptions: See MAR ?Over the Counter: See MAR ?History of alcohol / drug use?: Yes ?Longest period of sobriety (when/how long): Unknown ?Negative Consequences of Use:  (Pt denies) ?Withdrawal Symptoms: None ?Name of Substance 1: Cocaine per hx although patient denies recent use ?1 - Age of First Use: Unknown ?1 - Amount (size/oz): UTA ?1 - Frequency: UTA ?1 - Duration: UTA ?1 - Last Use / Amount: UTA ?1- Route of Use: Per chart smoking ?Name of Substance 2: Alcohol per chart although pt denies recent use ?2 - Age of First Use: Unknown ?2 - Amount  (size/oz): UTA ?2 - Frequency: UTA ?2 - Duration: UTA ?2 - Last Use / Amount: UTA ?2 - Route of Substance Use: Oral ?  ?  ?  ?  ?  ?  ?  ? ?Sleep: Fair ? ?Appetite:  Good ? ?Current Medications:  ?Current Facility-Administered Medications  ?Medication Dose Route Frequency Provider Last Rate Last Admin  ? acetaminophen (TYLENOL) tablet 650 mg  650 mg Oral Q6H PRN Derrill Center, NP      ? alum & mag hydroxide-simeth (MAALOX/MYLANTA) 200-200-20 MG/5ML suspension 30 mL  30 mL Oral Q4H PRN Derrill Center, NP      ? aspirin EC tablet 81 mg  81 mg Oral Daily Derrill Center, NP   81 mg at 04/25/22 1029  ? atorvastatin (LIPITOR) tablet 80 mg  80 mg Oral Daily Derrill Center, NP  80 mg at 04/25/22 1030  ? ezetimibe (ZETIA) tablet 10 mg  10 mg Oral Daily Derrill Center, NP   10 mg at 04/25/22 1029  ? furosemide (LASIX) tablet 40 mg  40 mg Oral Daily Derrill Center, NP   40 mg at 04/25/22 1031  ? gabapentin (NEURONTIN) capsule 100 mg  100 mg Oral TID Derrill Center, NP   100 mg at 04/25/22 1030  ? hydrOXYzine (ATARAX) tablet 25 mg  25 mg Oral TID PRN Derrill Center, NP   25 mg at 04/23/22 2108  ? isosorbide-hydrALAZINE (BIDIL) 20-37.5 MG per tablet 1 tablet  1 tablet Oral TID Derrill Center, NP   1 tablet at 04/25/22 1032  ? magnesium hydroxide (MILK OF MAGNESIA) suspension 30 mL  30 mL Oral Daily PRN Derrill Center, NP      ? melatonin tablet 5 mg  5 mg Oral QHS Ival Bible, MD   5 mg at 04/24/22 2121  ? sacubitril-valsartan (ENTRESTO) 97-103 mg per tablet  1 tablet Oral BID Tawnya Crook, RPH   1 tablet at 04/25/22 1029  ? sertraline (ZOLOFT) tablet 100 mg  100 mg Oral Daily Ival Bible, MD   100 mg at 04/25/22 1031  ? spironolactone (ALDACTONE) tablet 25 mg  25 mg Oral Daily Derrill Center, NP   25 mg at 04/25/22 1030  ? timolol (TIMOPTIC) 0.5 % ophthalmic solution 1 drop  1 drop Both Eyes QPC breakfast Derrill Center, NP   1 drop at 04/25/22 1031  ? ?Current Outpatient Medications   ?Medication Sig Dispense Refill  ? furosemide (LASIX) 40 MG tablet Take 1 tablet (40 mg total) by mouth daily. 30 tablet 2  ? isosorbide-hydrALAZINE (BIDIL) 20-37.5 MG tablet Take 1 tablet by mouth 3 (three) times da

## 2022-04-25 NOTE — Progress Notes (Signed)
Received Matthew [name] this Am asleep in his bed, he woke up for breakfast and returned to his room. Later he made several calls and stated he  cannot reach his housing person. This has caused him to feel anxious. He was compliant with his medications and stated feeling dizzy after taking the medication. It was suggested he take the medications at night after discharged for compliance and aid to sleep. The complications were discussed related to non compliance. He continued to make phone calls. ?

## 2022-04-25 NOTE — BH IP Treatment Plan (Signed)
Interdisciplinary Treatment and Diagnostic Plan Update ? ?04/25/2022 ?Time of Session: 9:30AM ? ?Matthew [name]. ?MRN: [id number] ? ?Diagnosis:  ?Final diagnoses:  ?Severe episode of recurrent major depressive disorder, without psychotic features ([place])  ? ? ? ?Current Medications:  ?Current Facility-Administered Medications  ?Medication Dose Route Frequency Provider Last Rate Last Admin  ? acetaminophen (TYLENOL) tablet 650 mg  650 mg Oral Q6H PRN Derrill Center, NP      ? alum & mag hydroxide-simeth (MAALOX/MYLANTA) 200-200-20 MG/5ML suspension 30 mL  30 mL Oral Q4H PRN Derrill Center, NP      ? aspirin EC tablet 81 mg  81 mg Oral Daily Derrill Center, NP   81 mg at 04/24/22 1016  ? atorvastatin (LIPITOR) tablet 80 mg  80 mg Oral Daily Derrill Center, NP   80 mg at 04/24/22 1016  ? ezetimibe (ZETIA) tablet 10 mg  10 mg Oral Daily Derrill Center, NP   10 mg at 04/24/22 1017  ? furosemide (LASIX) tablet 40 mg  40 mg Oral Daily Derrill Center, NP   40 mg at 04/24/22 1016  ? gabapentin (NEURONTIN) capsule 100 mg  100 mg Oral TID Derrill Center, NP   100 mg at 04/24/22 2122  ? hydrOXYzine (ATARAX) tablet 25 mg  25 mg Oral TID PRN Derrill Center, NP   25 mg at 04/23/22 2108  ? isosorbide-hydrALAZINE (BIDIL) 20-37.5 MG per tablet 1 tablet  1 tablet Oral TID Derrill Center, NP   1 tablet at 04/24/22 2121  ? magnesium hydroxide (MILK OF MAGNESIA) suspension 30 mL  30 mL Oral Daily PRN Derrill Center, NP      ? melatonin tablet 5 mg  5 mg Oral QHS Ival Bible, MD   5 mg at 04/24/22 2121  ? sacubitril-valsartan (ENTRESTO) 97-103 mg per tablet  1 tablet Oral BID Tawnya Crook, RPH   1 tablet at 04/24/22 2122  ? sertraline (ZOLOFT) tablet 100 mg  100 mg Oral Daily Ival Bible, MD   100 mg at 04/24/22 1016  ? spironolactone (ALDACTONE) tablet 25 mg  25 mg Oral Daily Derrill Center, NP   25 mg at 04/24/22 1018  ? timolol (TIMOPTIC) 0.5 % ophthalmic solution 1 drop  1 drop Both Eyes QPC breakfast  Derrill Center, NP   1 drop at 04/24/22 1018  ? ?Current Outpatient Medications  ?Medication Sig Dispense Refill  ? furosemide (LASIX) 40 MG tablet Take 1 tablet (40 mg total) by mouth daily. 30 tablet 2  ? isosorbide-hydrALAZINE (BIDIL) 20-37.5 MG tablet Take 1 tablet by mouth 3 (three) times daily. 270 tablet 3  ? sacubitril-valsartan (ENTRESTO) 49-51 MG Take 1 tablet by mouth 2 (two) times daily.    ? aspirin EC 81 MG EC tablet Take 1 tablet (81 mg total) by mouth daily. Swallow whole. (Patient not taking: Reported on 04/21/2022) 30 tablet 0  ? atorvastatin (LIPITOR) 80 MG tablet Take 1 tablet (80 mg total) by mouth daily. (Patient not taking: Reported on 04/21/2022) 30 tablet 2  ? ezetimibe (ZETIA) 10 MG tablet Take 1 tablet (10 mg total) by mouth daily. (Patient not taking: Reported on 04/21/2022) 30 tablet 0  ? gabapentin (NEURONTIN) 100 MG capsule Take 1 capsule (100 mg total) by mouth 3 (three) times daily. (Patient not taking: Reported on 04/21/2022) 30 capsule 0  ? hydrOXYzine (ATARAX) 25 MG tablet Take 1 tablet (25 mg total) by mouth 3 (  three) times daily as needed for anxiety. (Patient not taking: Reported on 04/21/2022) 60 tablet 0  ? OLANZapine (ZYPREXA) 2.5 MG tablet Take 1 tablet (2.5 mg total) by mouth daily. (Patient not taking: Reported on 04/21/2022) 30 tablet 0  ? OLANZapine (ZYPREXA) 7.5 MG tablet Take 1 tablet (7.5 mg total) by mouth at bedtime. (Patient not taking: Reported on 04/21/2022) 30 tablet 0  ? sacubitril-valsartan (ENTRESTO) 97-103 MG Take 1 tablet by mouth 2 (two) times daily. (Patient not taking: Reported on 04/21/2022) 60 tablet 11  ? sertraline (ZOLOFT) 25 MG tablet Take 3 tablets (75 mg total) by mouth daily. (Patient not taking: Reported on 04/21/2022) 90 tablet 0  ? spironolactone (ALDACTONE) 25 MG tablet Take 1 tablet (25 mg total) by mouth daily. (Patient not taking: Reported on 04/21/2022) 30 tablet 2  ? timolol (TIMOPTIC) 0.5 % ophthalmic solution Place 1 drop into both eyes daily after  breakfast. (Patient not taking: Reported on 04/21/2022) 5 mL 0  ? ?PTA Medications: ?Prior to Admission medications   ?Medication Sig Start Date End Date Taking? Authorizing Provider  ?furosemide (LASIX) 40 MG tablet Take 1 tablet (40 mg total) by mouth daily. 04/16/22  Yes Milford, Maricela Bo, FNP  ?isosorbide-hydrALAZINE (BIDIL) 20-37.5 MG tablet Take 1 tablet by mouth 3 (three) times daily. 03/23/22  Yes Larey Dresser, MD  ?sacubitril-valsartan (ENTRESTO) 49-51 MG Take 1 tablet by mouth 2 (two) times daily.   Yes [provider]  ?aspirin EC 81 MG EC tablet Take 1 tablet (81 mg total) by mouth daily. Swallow whole. ?Patient not taking: Reported on 04/21/2022 02/27/22   Nicholes Rough, NP  ?atorvastatin (LIPITOR) 80 MG tablet Take 1 tablet (80 mg total) by mouth daily. ?Patient not taking: Reported on 04/21/2022 04/16/22   Rafael Bihari, FNP  ?ezetimibe (ZETIA) 10 MG tablet Take 1 tablet (10 mg total) by mouth daily. ?Patient not taking: Reported on 04/21/2022 03/16/22   Charlott Rakes, MD  ?gabapentin (NEURONTIN) 100 MG capsule Take 1 capsule (100 mg total) by mouth 3 (three) times daily. ?Patient not taking: Reported on 04/21/2022 03/16/22   Charlott Rakes, MD  ?hydrOXYzine (ATARAX) 25 MG tablet Take 1 tablet (25 mg total) by mouth 3 (three) times daily as needed for anxiety. ?Patient not taking: Reported on 04/21/2022 03/16/22   Charlott Rakes, MD  ?OLANZapine (ZYPREXA) 2.5 MG tablet Take 1 tablet (2.5 mg total) by mouth daily. ?Patient not taking: Reported on 04/21/2022 03/16/22   Charlott Rakes, MD  ?OLANZapine (ZYPREXA) 7.5 MG tablet Take 1 tablet (7.5 mg total) by mouth at bedtime. ?Patient not taking: Reported on 04/21/2022 02/26/22   Nicholes Rough, NP  ?sacubitril-valsartan (ENTRESTO) 97-103 MG Take 1 tablet by mouth 2 (two) times daily. ?Patient not taking: Reported on 04/21/2022 04/16/22   Rafael Bihari, FNP  ?sertraline (ZOLOFT) 25 MG tablet Take 3 tablets (75 mg total) by mouth daily. ?Patient not taking:  Reported on 04/21/2022 02/27/22   Nicholes Rough, NP  ?spironolactone (ALDACTONE) 25 MG tablet Take 1 tablet (25 mg total) by mouth daily. ?Patient not taking: Reported on 04/21/2022 04/16/22   Rafael Bihari, FNP  ?timolol (TIMOPTIC) 0.5 % ophthalmic solution Place 1 drop into both eyes daily after breakfast. ?Patient not taking: Reported on 04/21/2022 02/26/22   Nicholes Rough, NP  ?lovastatin (MEVACOR) 20 MG tablet Take 2 tablets (40 mg total) by mouth at bedtime. 06/24/14 08/26/14  Lorayne Marek, MD  ? ? ?Patient Stressors: Financial difficulties   ?Loss of housing   ?  Other: Homelessness;    ? ?Patient Strengths: Capable of independent living  ?Motivation for treatment/growth  ? ?Treatment Modalities: Medication Management, Group therapy, Case management,  ?1 to 1 session with clinician, Psychoeducation, Recreational therapy. ? ? ?Physician Treatment Plan for Primary and Secondary Diagnosis:  ?Final diagnoses:  ?Severe episode of recurrent major depressive disorder, without psychotic features ([place])  ? ?Long Term Goal(s): Improvement in symptoms so as ready for discharge ? ?Short Term Goals: Ability to identify changes in lifestyle to reduce recurrence of condition will improve ?Ability to verbalize feelings will improve ?Ability to disclose and discuss suicidal ideas ?Ability to demonstrate self-control will improve ? ?Medication Management: Evaluate patient's response, side effects, and tolerance of medication regimen. ? ?Therapeutic Interventions: 1 to 1 sessions, Unit Group sessions and Medication administration. ? ?Evaluation of Outcomes: Adequate for Discharge ? ?RN Treatment Plan for Primary Diagnosis:  ?Final diagnoses:  ?Severe episode of recurrent major depressive disorder, without psychotic features ([place])  ? ? ?Long Term Goal(s): Knowledge of disease and therapeutic regimen to maintain health will improve ? ?Short Term Goals: Ability to identify and develop effective coping behaviors will improve and  Compliance with prescribed medications will improve ? ?Medication Management: RN will administer medications as ordered by provider, will assess and evaluate patient's response and provide education to patient fo

## 2022-04-25 NOTE — Clinical Social Work Psych Note (Signed)
LCSW Update ? ? ?Maxamillian shared that he felt dizzy today. Merrit presented with a dysphoric affect, congruent mood, however he was pleasant and cooperative. Alwaleed denied any current SI, HI or AVH at this time.  ? ?Rydge continues to worry about his disposition once he is discharged from the [facility]. Camar confirmed that he was a resident at [facility] in [place], [place]. There was a misunderstanding during his initial assessment, however Anshul reports he was never a resident at [facility] in [place], [place]. Arley shared he has been homeless since January 2023, when his home was foreclosed.  ? ?Jaylenn shared with LCSW that he does have a substance abuse issues that he minimized when he was initially assessed. Rawleigh shared that he uses cocaine intermittently. He reports his last use was last week. Zaron reports that if he cannot return to his previous shelter placement, that he is interested in participating in a residential treatment program to address his substance abuse issues.  ? ?Alvis was referred to [facility] on Thursday, 04/26/22. Deshone was informed of his acceptance and expressed understanding.  ? ?Rolfe will be transported to [facility] via taxi voucher. Talmage will be discharged with his 14-day supply of medication samples and a 30-day prescription with 1 refill.  ? ? LCSW ensured Myron that she would contact his caseworker, Johnell Comings with the updates.  ? ?LCSW will continue to follow until discharge.  ? ? ?Radonna Ricker, MSW, LCSW ?Clinical Education officer, museum Insurance claims handler) ?[facility]  ? ? ? ? ? ? ? ?

## 2022-04-25 NOTE — Progress Notes (Signed)
[place] remained in his room throughout the day except for his meals. He continued to be compliant with his medications. He returned to bed after dinner without complaints. ?

## 2022-04-25 NOTE — ED Notes (Signed)
Pt is currently sleeping, no distress noted, environmental check complete, will continue to monitor patient for safety. ? ?

## 2022-04-25 NOTE — ED Provider Notes (Signed)
FBC/OBS ASAP Discharge Summary ? ?Date and Time: 04/25/2022 4:00 PM  ?Name: Matthew [name].  ?MRN:  [id number]  ? ?Discharge Diagnoses:  ?Final diagnoses:  ?Severe episode of recurrent major depressive disorder, without psychotic features ([place])  ? ? ?Subjective:  ? 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 He was admitted to the [facility] for crisis stabilization and restarted on his home medications. Uds negative; etoh negative ?  ?Patient seen and chart reviewed. He has been medication compliant and has been appropriate with staff and peers on the unit.  Patient interviewed in his room this afternoon in conjunction with LCSW.  He is calm, cooperative and pleasant on interview and describes his mood as "good". He denies SI/HI/AVH. He describes experiencing broken sleep and denies issues with appetite. Patient reports feeling dizzy after receiving blood pressure medications. Per nursing, patient had reported that he had not been compliant with his blood pressure medications after a recent admission to [facility] which had subsequently been restarted; BP remains elevated despite compliance with medications here. Discussed importance of compliance with medications and that this should resolve with time. ? ?See LCSW note for further information; patient previously was staying at [facility] in [place] and is currently homeless in [place]. He is unable to return to [place] at this time. Patient reports current homelessness as a stressor. He states that he has been working with [employer] regarding housing. LCSW has spoken to staff associated with this program --see  note for details. Patient reports that he last used cocaine last week and expresses interest in residential substance use treatment if possible. UDS from this visit was negative; however, 02/14/22 UDS+cocaine. Discussed that a referral could be made but that if he is not accepted that he would be provided with resources and discharged to the [facility] to assist with housing resources. Patient verbalizes understanding ? ? ?Later in the afternoon patient was accepted to [facility] for tomorrow at 9 AM. Patient was notified of acceptance and was agreeable with plan. ? 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 He was admitted to the [facility] for crisis stabilization and restarted on his home medications. Uds negative; etoh negative. Zoloft was increased from 75 mg daily to 100 mg daily on 5/9 for depression-tolerated increase well without SE/AE. Patient was accepted to [facility] on 04/26/22 for screening appointment and was agreeable with plan ?  ? ?Total Time spent with patient: 15 minutes ? ?Past Psychiatric History: MDD with psychosis; substance use ?Past Medical History:  ?Past Medical History:  ?Diagnosis Date  ? Acute combined systolic and diastolic CHF, NYHA class 3 ([place])   ? a. 02/2014 Echo: EF 25-30%.  ? Cardiomyopathy ([place])   ? a. 02/2014 Echo: EF 25-30%, sev glob HK with inferolat HK->AK, mod  conc LVH, Gr 2 DD, Mild MR, sev dil LA.  ? Depression   ? DM (diabetes mellitus) ([place])   ? High cholesterol   ? Hypertension   ? Morbid obesity ([place])   ? Tobacco abuse   ?  ?Past Surgical History:  ?Procedure Laterality Date  ? CATARACT EXTRACTION Right   ? LEFT HEART CATHETERIZATION  WITH CORONARY ANGIOGRAM N/A 03/04/2014  ? Procedure: LEFT HEART CATHETERIZATION WITH CORONARY ANGIOGRAM;  Surgeon: Burnell Blanks, MD;  Location: [facility] CATH LAB;  Service: Cardiovascular;  Laterality: N/A;  ? TEE WITHOUT CARDIOVERSION N/A 02/04/2017  ? Procedure: TRANSESOPHAGEAL ECHOCARDIOGRAM (TEE);  Surgeon: Dorothy Spark, MD;  Location: [place];  Service: Cardiovascular;  Laterality: N/A;  ? ?Family History:  ?Family History  ?Problem Relation Age of Onset  ? Heart attack Father   ?     died @ 21  ? Cervical cancer Mother   ?     died @ 73  ? ?Family Psychiatric History: denies ?Social History:  ?Social History  ? ?Substance and Sexual Activity  ?Alcohol Use No  ?   ?Social History  ? ?Substance and Sexual Activity  ?Drug Use No  ?  ?Social History  ? ?Socioeconomic History  ? Marital status: Widowed  ?  Spouse name: Not on file  ? Number of children: 2  ? Years of education: 18  ? Highest education level: Not on file  ?Occupational History  ? Occupation: [employer]  ?Tobacco Use  ? Smoking status: Former  ?  Years: 20.00  ?  Types: Cigarettes  ? Smokeless tobacco: Former  ?  Quit date: 11/14/2020  ?Vaping Use  ? Vaping Use: Never used  ?Substance and Sexual Activity  ? Alcohol use: No  ? Drug use: No  ? Sexual activity: Yes  ?Other Topics Concern  ? Not on file  ?Social History Narrative  ? Lives with 2 young children in [place].  Wife died in 05-03-20.  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  ? Left-handed  ? Caffeine: occasional tea  ? ?Social Determinants of Health  ? ?Financial Resource Strain: Not on file  ?Food Insecurity: Not on file  ?Transportation Needs: Not on file  ?Physical Activity: Not on file  ?Stress: Not on file  ?Social Connections: Not on file  ? ?SDOH:  ?SDOH Screenings  ? ?Alcohol Screen: Low Risk   ? Last Alcohol Screening Score (AUDIT): 0  ?Depression (PHQ2-9): Medium Risk  ? PHQ-2 Score: 13  ?Financial Resource Strain: Not on file  ?Food Insecurity:  Not on file  ?Housing: Not on file  ?Physical Activity: Not on file  ?Social Connections: Not on file  ?Stress: Not on file  ?Tobacco Use: Medium Risk  ? Smoking Tobacco Use: Former  ? Smokeless Tobacco Use: Former  ? Passive Exposure: Not on file  ?Transportation Needs: Not on file  ? ? ?Tobacco Cessation:  Prescription not provided because: n/a ? ?Current Medications:  ?Current Facility-Administered Medications  ?Medication Dose Route Frequency Provider Last Rate Last Admin  ? acetaminophen (TYLENOL) tablet 650 mg  650 mg Oral Q6H PRN Derrill Center, NP      ? alum & mag hydroxide-simeth (MAALOX/MYLANTA) 200-200-20 MG/5ML suspension 30 mL  30 mL Oral Q4H PRN Derrill Center, NP      ? aspirin EC tablet 81 mg  81 mg Oral Daily Derrill Center, NP   81 mg at 04/25/22 1029  ?  atorvastatin (LIPITOR) tablet 80 mg  80 mg Oral Daily Derrill Center, NP   80 mg at 04/25/22 1030  ? ezetimibe (ZETIA) tablet 10 mg  10 mg Oral Daily Derrill Center, NP   10 mg at 04/25/22 1029  ? furosemide (LASIX) tablet 40 mg  40 mg Oral Daily Derrill Center, NP   40 mg at 04/25/22 1031  ? gabapentin (NEURONTIN) capsule 100 mg  100 mg Oral TID Derrill Center, NP   100 mg at 04/25/22 1555  ? hydrOXYzine (ATARAX) tablet 25 mg  25 mg Oral TID PRN Derrill Center, NP   25 mg at 04/23/22 2108  ? isosorbide-hydrALAZINE (BIDIL) 20-37.5 MG per tablet 1 tablet  1 tablet Oral TID Derrill Center, NP   1 tablet at 04/25/22 1555  ? magnesium hydroxide (MILK OF MAGNESIA) suspension 30 mL  30 mL Oral Daily PRN Derrill Center, NP      ? melatonin tablet 5 mg  5 mg Oral QHS Ival Bible, MD   5 mg at 04/24/22 2121  ? sacubitril-valsartan (ENTRESTO) 97-103 mg per tablet  1 tablet Oral BID Tawnya Crook, RPH   1 tablet at 04/25/22 1029  ? sertraline (ZOLOFT) tablet 100 mg  100 mg Oral Daily Ival Bible, MD   100 mg at 04/25/22 1031  ? spironolactone (ALDACTONE) tablet 25 mg  25 mg Oral Daily Derrill Center, NP   25 mg at 04/25/22  1030  ? timolol (TIMOPTIC) 0.5 % ophthalmic solution 1 drop  1 drop Both Eyes QPC breakfast Derrill Center, NP   1 drop at 04/25/22 1031  ? ?Current Outpatient Medications  ?Medication Sig Dispense Refi

## 2022-04-25 NOTE — ED Notes (Signed)
Pt in his room calm and cooperative. Has no c/o pain or distress. Will continue to monitor for safety ?

## 2022-04-25 NOTE — ED Notes (Signed)
Pt is currently attending AA ?

## 2022-04-25 NOTE — ED Notes (Signed)
Pt. Is attending AA. ?

## 2022-04-26 ENCOUNTER — Other Ambulatory Visit (HOSPITAL_COMMUNITY): Payer: Medicaid Other

## 2022-04-26 ENCOUNTER — Telehealth (HOSPITAL_COMMUNITY): Payer: Self-pay | Admitting: Pharmacy Technician

## 2022-04-26 NOTE — ED Notes (Signed)
Pt sleeping@this time. Breathing even and unlabored. Will continue to monitor for safety 

## 2022-04-26 NOTE — ED Notes (Signed)
Discharged to [facility] with samples of medications from [facility] [facility], all discharge instructions, transported via [place] taxi.  ? ?Patient became agitated when told he needed to get ready this morning, refused morning medications.  ?

## 2022-04-26 NOTE — Telephone Encounter (Signed)
Advanced Heart Failure Patient Advocate Encounter ? ?Received a call from Trenda Moots (Nurse, [phone], ext [phone]) at [facility] inquiring into the patient's medication assistance for [facility]. They do not provide that medication at [facility] due to cost. [employer]. The patient was mailed a letter informing him of POI. I am not sure if that letter was mailed before the patient became homeless or not. Called [employer] to get the status of [employer] application. Had to leave a message for a call back. Most likely, they will also require POI. ? ?Called and updated Silva Bandy with the information above. Provided her the phone number to both [name] and [place]. Advised her that the patient would have to call [employer] himself to get the income attestation form sent. She asked if [employer] would be willing to fax that form instead of mailing. I am not sure. The patient would need to explain his situation and request the form to be faxed instead. ? ?Informed Silva Bandy I would call her back with any updates that are received. ? ?Charlann Boxer, CPhT ? ?

## 2022-04-26 NOTE — Telephone Encounter (Signed)
Advanced Heart Failure Patient Advocate Encounter ? ?Received a call back from [place] [phone]) at [facility]. The patient was approved for [employer] assistance on 03/26/22-03/26/23.  ? ?A 90 day supply was sent to the [place] address that was on file at the time of completing the application. They are willing to resend if the address is updated. They automatically ship every 90 days so [employer] wants to be sure he will still be at [facility] when it is time for a refill to be sent. I am not sure if the patient received the first 90 day supply or not. I did not have the address to [facility] to provide. Called [place] and left an updated message with the information above. Advised her to call back if needed. I will call and update the address if they do not allow [facility] to do so.  ? ?Charlann Boxer, CPhT ? ?

## 2022-05-08 ENCOUNTER — Encounter (HOSPITAL_COMMUNITY): Payer: Medicaid Other

## 2022-05-10 ENCOUNTER — Other Ambulatory Visit: Payer: Self-pay

## 2022-05-11 ENCOUNTER — Telehealth (HOSPITAL_COMMUNITY): Payer: Self-pay | Admitting: Pharmacy Technician

## 2022-05-11 NOTE — Telephone Encounter (Signed)
Advanced Heart Failure Patient Advocate Encounter  Matthew [name] called back and left a message asking for samples of Entresto. Provided a month of samples.  Medication Samples have been provided to the patient.  Drug name: Delene Loll       Strength: 49-'51mg'$         Qty: 4 bottles  LOT: [id number]  Exp.Date: 10/2023  Dosing instructions: Take 2 tablets by mouth twice daily.   The patient has been instructed regarding the correct time, dose, and frequency of taking this medication, including desired effects and most common side effects.   [place] 1:22 PM 05/11/2022

## 2022-05-12 ENCOUNTER — Telehealth (HOSPITAL_COMMUNITY): Payer: Self-pay | Admitting: Family Medicine

## 2022-05-12 NOTE — BH Assessment (Signed)
Care Management - Follow Up [facility] Discharges   Patient was placed at [facility] on 04-26-2022

## 2022-06-27 ENCOUNTER — Ambulatory Visit: Payer: Self-pay | Admitting: Physician Assistant

## 2022-06-27 ENCOUNTER — Ambulatory Visit: Payer: Self-pay

## 2022-06-27 ENCOUNTER — Encounter: Payer: Self-pay | Admitting: Physician Assistant

## 2022-06-27 VITALS — BP 150/95 | HR 75 | Resp 20 | Ht 69.0 in | Wt 239.0 lb

## 2022-06-27 DIAGNOSIS — I160 Hypertensive urgency: Secondary | ICD-10-CM

## 2022-06-27 DIAGNOSIS — F411 Generalized anxiety disorder: Secondary | ICD-10-CM

## 2022-06-27 DIAGNOSIS — E1121 Type 2 diabetes mellitus with diabetic nephropathy: Secondary | ICD-10-CM

## 2022-06-27 DIAGNOSIS — R635 Abnormal weight gain: Secondary | ICD-10-CM

## 2022-06-27 DIAGNOSIS — I10 Essential (primary) hypertension: Secondary | ICD-10-CM

## 2022-06-27 DIAGNOSIS — F17210 Nicotine dependence, cigarettes, uncomplicated: Secondary | ICD-10-CM

## 2022-06-27 DIAGNOSIS — E782 Mixed hyperlipidemia: Secondary | ICD-10-CM

## 2022-06-27 DIAGNOSIS — Z6835 Body mass index (BMI) 35.0-35.9, adult: Secondary | ICD-10-CM

## 2022-06-27 DIAGNOSIS — F1410 Cocaine abuse, uncomplicated: Secondary | ICD-10-CM

## 2022-06-27 DIAGNOSIS — F332 Major depressive disorder, recurrent severe without psychotic features: Secondary | ICD-10-CM

## 2022-06-27 DIAGNOSIS — Z8673 Personal history of transient ischemic attack (TIA), and cerebral infarction without residual deficits: Secondary | ICD-10-CM

## 2022-06-27 DIAGNOSIS — H538 Other visual disturbances: Secondary | ICD-10-CM

## 2022-06-27 DIAGNOSIS — F17200 Nicotine dependence, unspecified, uncomplicated: Secondary | ICD-10-CM

## 2022-06-27 DIAGNOSIS — I509 Heart failure, unspecified: Secondary | ICD-10-CM

## 2022-06-27 LAB — POCT GLYCOSYLATED HEMOGLOBIN (HGB A1C): Hemoglobin A1C: 6.5 % — AB (ref 4.0–5.6)

## 2022-06-27 LAB — GLUCOSE, POCT (MANUAL RESULT ENTRY): POC Glucose: 160 mg/dl — AB (ref 70–99)

## 2022-06-27 MED ORDER — ENTRESTO 97-103 MG PO TABS: 1.0000 | ORAL_TABLET | Freq: Two times a day (BID) | ORAL | 1 refills | Status: DC

## 2022-06-27 MED ORDER — METFORMIN HCL ER 500 MG PO TB24: 500.0000 mg | ORAL_TABLET | Freq: Every day | ORAL | 1 refills | Status: DC

## 2022-06-27 NOTE — Telephone Encounter (Signed)
    Chief Complaint: Matthew [name] with [facility] report BP running 170/100 Symptoms: Headache Frequency: Last week Pertinent Negatives: Patient denies chest pain Disposition: '[]'$ ED /'[]'$ Urgent Care (no appt availability in office) / '[]'$ Appointment(In office/virtual)/ '[]'$  [facility]/ '[]'$ Home Care/ '[]'$ Refused Recommended Disposition /'[x]'$ [place] [facility]/ '[]'$  Follow-up with PCP Additional Notes:   Reason for Disposition  [6] Systolic BP  >= 387 OR Diastolic >= 564 AND [3] having NO cardiac or neurologic symptoms  Answer Assessment - Initial Assessment Questions 1. BLOOD PRESSURE: "What is the blood pressure?" "Did you take at least two measurements 5 minutes apart?"     170/100 2. ONSET: "When did you take your blood pressure?"     Last week 3. HOW: "How did you obtain the blood pressure?" (e.g., visiting nurse, automatic home BP monitor)     Cuff 4. HISTORY: "Do you have a history of high blood pressure?"     Yes 5. MEDICATIONS: "Are you taking any medications for blood pressure?" "Have you missed any doses recently?"     Yes 6. OTHER SYMPTOMS: "Do you have any symptoms?" (e.g., headache, chest pain, blurred vision, difficulty breathing, weakness)     Headache, blurred vision 7. PREGNANCY: "Is there any chance you are pregnant?" "When was your last menstrual period?"     N/a  Protocols used: Blood Pressure - High-A-AH

## 2022-06-27 NOTE — Progress Notes (Unsigned)
Established Patient Office Visit  Subjective   Patient ID: Matthew [name]., male    DOB: [birth year]  Age: 56 y.o. MRN: [id number]  Chief Complaint  Patient presents with   Hypertension   Blurred Vision    Right eye. Started last week.    States that he is currently being treated for substance abuse at [facility] residential treatment center.  States that he arrived on May 11.  States that he is planning on transitioning to long-term care at [facility].  Process is in place and he may be transitioning the next 2 weeks.  States that he has been out of his Delene Loll for the past month due to financial constraints.  States that he has been having elevated blood pressure readings similar to today's or slightly higher.  States that he has been having excessive urination, states that he has also started having blurry vision in his right eye which has been constant and started last week.  Does endorse a 20 pound weight gain in the past 2 months, states that he is drinking mostly water during the day, does drink some Kool-Aid during the day as well.      Past Medical History:  Diagnosis Date   Acute combined systolic and diastolic CHF, NYHA class 3 ([place])    a. 02/2014 Echo: EF 25-30%.   Cardiomyopathy ([place])    a. 02/2014 Echo: EF 25-30%, sev glob HK with inferolat HK->AK, mod conc LVH, Gr 2 DD, Mild MR, sev dil LA.   Depression    DM (diabetes mellitus) ([place])    High cholesterol    Hypertension    Morbid obesity ([place])    Tobacco abuse    Social History   Socioeconomic History   Marital status: Widowed    Spouse name: Not on file   Number of children: 2   Years of education: 14   Highest education level: Not on file  Occupational History   Occupation: [employer]  Tobacco Use   Smoking status: Former    Years: 20.00    Types: Cigarettes   Smokeless tobacco: Former    Quit date: 11/14/2020  Vaping Use   Vaping Use: Never used  Substance and Sexual Activity   Alcohol use: No    Drug use: No   Sexual activity: Yes  Other Topics Concern   Not on file  Social History Narrative   Lives with 2 young children in [place].  Wife died in 05/01/2020.  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   Left-handed   Caffeine: occasional tea   Social Determinants of Health   Financial Resource Strain: High Risk (01/24/2021)   Overall Financial Resource Strain (CARDIA)    Difficulty of Paying Living Expenses: Very hard  Food Insecurity: Food Insecurity Present (01/24/2021)   Hunger Vital Sign    Worried About Running Out of Food in the Last Year: Sometimes true    Ran Out of Food in the Last Year: Sometimes true  Transportation Needs: Unmet Transportation Needs (01/24/2021)   PRAPARE - Hydrologist (Medical): Yes    Lack of Transportation (Non-Medical): Yes  Physical Activity: Not on file  Stress: Stress Concern Present (01/24/2021)   [place]    Feeling of Stress : Very much  Social Connections: Not on file  Intimate Partner Violence: Not on file   Family History  Problem Relation Age of  Onset   Heart attack Father        died @ 55   Cervical cancer Mother        died @ 3   No Known Allergies  Review of Systems  Constitutional: Negative.   HENT: Negative.    Eyes:  Positive for blurred vision. Negative for double vision, photophobia and pain.  Respiratory:  Negative for shortness of breath.   Cardiovascular:  Negative for chest pain.  Gastrointestinal:  Negative for abdominal pain, nausea and vomiting.  Genitourinary:  Positive for frequency. Negative for dysuria.  Musculoskeletal: Negative.   Skin: Negative.   Neurological:  Negative for headaches.  Endo/Heme/Allergies: Negative.   Psychiatric/Behavioral: Negative.        Objective:     BP (!) 150/95 (BP Location: Left Arm, Patient Position: Sitting, Cuff Size: Normal)   Pulse 75   Resp 20    Ht '5\' 9"'$  (1.753 m)   Wt 239 lb (108.4 kg)   SpO2 98%   BMI 35.29 kg/m  BP Readings from Last 3 Encounters:  06/27/22 (!) 150/95  04/26/22 (!) 175/90  04/16/22 (!) 178/102   Wt Readings from Last 3 Encounters:  06/27/22 239 lb (108.4 kg)  04/16/22 219 lb 12.8 oz (99.7 kg)  02/15/22 189 lb (85.7 kg)      Physical Exam Vitals and nursing note reviewed.  Constitutional:      General: He is not in acute distress.    Appearance: Normal appearance. He is obese. He is not ill-appearing.  HENT:     Head: Normocephalic and atraumatic.     Right Ear: External ear normal.     Left Ear: External ear normal.     Nose: Nose normal.     Mouth/Throat:     Mouth: Mucous membranes are moist.     Pharynx: Oropharynx is clear.  Eyes:     Extraocular Movements: Extraocular movements intact.     Conjunctiva/sclera: Conjunctivae normal.     Pupils: Pupils are equal, round, and reactive to light.  Cardiovascular:     Rate and Rhythm: Normal rate and regular rhythm.     Pulses: Normal pulses.     Heart sounds: Normal heart sounds.  Pulmonary:     Effort: Pulmonary effort is normal.     Breath sounds: Normal breath sounds.  Musculoskeletal:        General: Normal range of motion.     Cervical back: Normal range of motion and neck supple.  Skin:    General: Skin is warm and dry.  Neurological:     Mental Status: He is alert and oriented to person, place, and time.     Cranial Nerves: No cranial nerve deficit.  Psychiatric:        Mood and Affect: Mood normal.        Behavior: Behavior normal.        Thought Content: Thought content normal.        Judgment: Judgment normal.      Results for orders placed or performed in visit on 06/27/22  POCT glucose (manual entry)  Result Value Ref Range   POC Glucose 160 (A) 70 - 99 mg/dl  POCT glycosylated hemoglobin (Hb A1C)  Result Value Ref Range   Hemoglobin A1C 6.5 (A) 4.0 - 5.6 %   HbA1c POC (<> result, manual entry)     HbA1c, POC  (prediabetic range)     HbA1c, POC (controlled diabetic range)  Assessment & Plan:   Problem List Items Addressed This Visit       Cardiovascular and Mediastinum   Hypertension   Relevant Medications   sacubitril-valsartan (ENTRESTO) 97-103 MG   CHF (congestive heart failure) (HCC)   Relevant Medications   sacubitril-valsartan (ENTRESTO) 97-103 MG     Endocrine   Diabetes mellitus type II, controlled ([place]) - Primary (Chronic)   Relevant Medications   metFORMIN (GLUCOPHAGE-XR) 500 MG 24 hr tablet   Other Relevant Orders   POCT glucose (manual entry) (Completed)   POCT glycosylated hemoglobin (Hb A1C) (Completed)     Other   Smoker   History of stroke   Mixed hyperlipidemia   Relevant Medications   sacubitril-valsartan (ENTRESTO) 97-103 MG   MDD (major depressive disorder), recurrent episode (HCC)   GAD (generalized anxiety disorder)   Cocaine abuse ([place])   Other Visit Diagnoses     Weight gain       Blurry vision, right eye       Class 2 severe obesity due to excess calories with serious comorbidity and body mass index (BMI) of 35.0 to 35.9 in adult [facility])       Relevant Medications   metFORMIN (GLUCOPHAGE-XR) 500 MG 24 hr tablet      1. Primary hypertension [facility] is unable to purchase this medication for patient.  We did [facility] as well as the social worker at heart care for further advice affordability/patient assistance.  Patient to follow-up with mobile team in 1 week.  Red flags given for prompt reevaluation - sacubitril-valsartan (ENTRESTO) 97-103 MG; Take 1 tablet by mouth 2 (two) times daily.  Dispense: 60 tablet; Refill: 1  2. Controlled type 2 diabetes mellitus with diabetic nephropathy, without long-term current use of insulin (HCC) A1c 6.6.  Will start metformin once daily, encouraged patient to work on modest weight loss, low sugar diet.  Encourage patient to check blood glucose  levels once daily, keep a written log and have available for all office visits. - metFORMIN (GLUCOPHAGE-XR) 500 MG 24 hr tablet; Take 1 tablet (500 mg total) by mouth daily with breakfast.  Dispense: 30 tablet; Refill: 1 - POCT glucose (manual entry) - POCT glycosylated hemoglobin (Hb A1C)  3. Weight gain 50 pound weight gain since March 2023, 20 pounds in last 2 months  4. Blurry vision, right eye More than likely due to elevated blood sugars, we will continue to monitor  5. Chronic congestive heart failure, unspecified heart failure type (HCC) Continue current regimen, no refills needed today  6. Severe episode of recurrent major depressive disorder, without psychotic features ([place]) Continue current regimen, no refills needed today  7. GAD (generalized anxiety disorder)   8. Mixed hyperlipidemia Continue current regimen no refills needed today  9. Smoker   10. Cocaine abuse ([place]) Currently in substance abuse treatment program  11. History of stroke   12. Class 2 severe obesity due to excess calories with serious comorbidity and body mass index (BMI) of 35.0 to 35.9 in adult [facility])    I have reviewed the patient's medical history (PMH, PSH, Social History, Family History, Medications, and allergies) , and have been updated if relevant. I spent 40 minutes reviewing chart and  face to face time with patient.    Return in about 1 week (around 07/04/2022) for with MMU.    Loraine Grip [place], PA-C

## 2022-06-27 NOTE — Patient Instructions (Signed)
You are going to start taking metformin once daily with breakfast.  I encourage you to check your blood glucose levels on a daily basis, keep a written log and have available for all office visits.  It is important that you follow a low sugar diet.  You are going to continue checking your blood pressure on a daily basis, keep a written log and have available for all office visits.  It is important that you follow a low-sodium diet.  Kennieth Rad, PA-C Physician Assistant [facility] [email or web address]   Low-Sodium Eating Plan Sodium, which is an element that makes up salt, helps you maintain a healthy balance of fluids in your body. Too much sodium can increase your blood pressure and cause fluid and waste to be held in your body. Your health care provider or dietitian may recommend following this plan if you have high blood pressure (hypertension), kidney disease, liver disease, or heart failure. Eating less sodium can help lower your blood pressure, reduce swelling, and protect your heart, liver, and kidneys. What are tips for following this plan? Reading food labels The Nutrition Facts label lists the amount of sodium in one serving of the food. If you eat more than one serving, you must multiply the listed amount of sodium by the number of servings. Choose foods with less than 140 mg of sodium per serving. Avoid foods with 300 mg of sodium or more per serving. Shopping  Look for lower-sodium products, often labeled as "low-sodium" or "no salt added." Always check the sodium content, even if foods are labeled as "unsalted" or "no salt added." Buy fresh foods. Avoid canned foods and pre-made or frozen meals. Avoid canned, cured, or processed meats. Buy breads that have less than 80 mg of sodium per slice. Cooking  Eat more home-cooked food and less restaurant, buffet, and fast food. Avoid adding salt when cooking. Use salt-free  seasonings or herbs instead of table salt or sea salt. Check with your health care provider or pharmacist before using salt substitutes. Cook with plant-based oils, such as canola, sunflower, or olive oil. Meal planning When eating at a restaurant, ask that your food be prepared with less salt or no salt, if possible. Avoid dishes labeled as brined, pickled, cured, smoked, or made with soy sauce, miso, or teriyaki sauce. Avoid foods that contain MSG (monosodium glutamate). MSG is sometimes added to [place] food, bouillon, and some canned foods. Make meals that can be grilled, baked, poached, roasted, or steamed. These are generally made with less sodium. General information Most people on this plan should limit their sodium intake to 1,500-2,000 mg (milligrams) of sodium each day. What foods should I eat? Fruits Fresh, frozen, or canned fruit. Fruit juice. Vegetables Fresh or frozen vegetables. "No salt added" canned vegetables. "No salt added" tomato sauce and paste. Low-sodium or reduced-sodium tomato and vegetable juice. Grains Low-sodium cereals, including oats, puffed wheat and rice, and shredded wheat. Low-sodium crackers. Unsalted rice. Unsalted pasta. Low-sodium bread. Whole-grain breads and whole-grain pasta. Meats and other proteins Fresh or frozen (no salt added) meat, poultry, seafood, and fish. Low-sodium canned tuna and salmon. Unsalted nuts. Dried peas, beans, and lentils without added salt. Unsalted canned beans. Eggs. Unsalted nut butters. Dairy Milk. Soy milk. Cheese that is naturally low in sodium, such as ricotta cheese, fresh mozzarella, or Swiss cheese. Low-sodium or reduced-sodium cheese. Cream cheese. Yogurt. Seasonings and condiments Fresh and dried herbs and spices. Salt-free seasonings. Low-sodium mustard and ketchup. Sodium-free salad dressing.  Sodium-free light mayonnaise. Fresh or refrigerated horseradish. Lemon juice. Vinegar. Other foods Homemade, reduced-sodium,  or low-sodium soups. Unsalted popcorn and pretzels. Low-salt or salt-free chips. The items listed above may not be a complete list of foods and beverages you can eat. Contact a dietitian for more information. What foods should I avoid? Vegetables Sauerkraut, pickled vegetables, and relishes. Olives. [place] fries. Onion rings. Regular canned vegetables (not low-sodium or reduced-sodium). Regular canned tomato sauce and paste (not low-sodium or reduced-sodium). Regular tomato and vegetable juice (not low-sodium or reduced-sodium). Frozen vegetables in sauces. Grains Instant hot cereals. Bread stuffing, pancake, and biscuit mixes. Croutons. Seasoned rice or pasta mixes. Noodle soup cups. Boxed or frozen macaroni and cheese. Regular salted crackers. Self-rising flour. Meats and other proteins Meat or fish that is salted, canned, smoked, spiced, or pickled. Precooked or cured meat, such as sausages or meat loaves. Berniece Salines. Ham. Pepperoni. Hot dogs. Corned beef. Chipped beef. Salt pork. Jerky. Pickled herring. Anchovies and sardines. Regular canned tuna. Salted nuts. Dairy Processed cheese and cheese spreads. Hard cheeses. Cheese curds. Blue cheese. Feta cheese. String cheese. Regular cottage cheese. Buttermilk. Canned milk. Fats and oils Salted butter. Regular margarine. Ghee. Bacon fat. Seasonings and condiments Onion salt, garlic salt, seasoned salt, table salt, and sea salt. Canned and packaged gravies. Worcestershire sauce. Tartar sauce. Barbecue sauce. Teriyaki sauce. Soy sauce, including reduced-sodium. Steak sauce. Fish sauce. Oyster sauce. Cocktail sauce. Horseradish that you find on the shelf. Regular ketchup and mustard. Meat flavorings and tenderizers. Bouillon cubes. Hot sauce. Pre-made or packaged marinades. Pre-made or packaged taco seasonings. Relishes. Regular salad dressings. Salsa. Other foods Salted popcorn and pretzels. Corn chips and puffs. Potato and tortilla chips. Canned or dried  soups. Pizza. Frozen entrees and pot pies. The items listed above may not be a complete list of foods and beverages you should avoid. Contact a dietitian for more information. Summary Eating less sodium can help lower your blood pressure, reduce swelling, and protect your heart, liver, and kidneys. Most people on this plan should limit their sodium intake to 1,500-2,000 mg (milligrams) of sodium each day. Canned, boxed, and frozen foods are high in sodium. Restaurant foods, fast foods, and pizza are also very high in sodium. You also get sodium by adding salt to food. Try to cook at home, eat more fresh fruits and vegetables, and eat less fast food and canned, processed, or prepared foods. This information is not intended to replace advice given to you by your health care provider. Make sure you discuss any questions you have with your health care provider. Document Revised: 01/08/2020 Document Reviewed: 11/04/2019 Elsevier Patient Education  [place].

## 2022-06-28 ENCOUNTER — Encounter: Payer: Self-pay | Admitting: Physician Assistant

## 2022-06-28 ENCOUNTER — Telehealth (HOSPITAL_COMMUNITY): Payer: Self-pay | Admitting: Licensed Clinical Social Worker

## 2022-06-28 DIAGNOSIS — F411 Generalized anxiety disorder: Secondary | ICD-10-CM | POA: Insufficient documentation

## 2022-06-28 DIAGNOSIS — F1410 Cocaine abuse, uncomplicated: Secondary | ICD-10-CM | POA: Insufficient documentation

## 2022-06-28 NOTE — Telephone Encounter (Signed)
CSW contacted by mobile unit physician regarding how to get this pt Entresto as he is currently at [facility] rehab center and they cannot provide medication due to expense.  CSW reviewed chart and saw that patient advocate had submitted application but that they were missing POI- informed provider of this and that [facility] should help him call and get that form faxed to their office directly to make the process go quicker.    CSW offered to provide samples while this is in process.  Provider to give above updates to [facility] and provide with CSW number so we can arrange getting samples- I will reinforce need to call to handle POI immediately and that we can only provide limited samples.  Will continue to follow and assist as needed  Jorge Ny, [facility] Desk#: [phone] Cell#: [phone]

## 2022-07-02 ENCOUNTER — Telehealth (HOSPITAL_COMMUNITY): Payer: Self-pay | Admitting: Licensed Clinical Social Worker

## 2022-07-02 NOTE — Telephone Encounter (Signed)
Contacted by Alfonso Ellis with [facility] in [place] to get samples to pt of Entresto while they work on getting no income statement returned to [name].  3 bottles of 49-'51mg'$  samples provided to bridge pt till new meds can be ordered  Jorge Ny, [facility] Desk#: [phone] Cell#: [phone]

## 2022-07-04 ENCOUNTER — Telehealth: Payer: Self-pay

## 2022-07-04 NOTE — Telephone Encounter (Signed)
Patient appearing on report for [employer] [place] [facility] - Hypertension Control report due to last documented ambulatory blood pressure of 150/95 mmHg on 06/27/22. Next appointment with PCP is not scheduled. Next cardiology appointment 07/19/2022.  Outreached patient to discuss hypertension control and medication management. However, phone number was not in service.   Joseph Art, Pharm.D. PGY-2 Ambulatory Care Pharmacy Resident 07/04/2022 11:55 AM

## 2022-07-06 NOTE — Telephone Encounter (Signed)
Advanced Heart Failure Patient Advocate Encounter   Patient was approved to receive Entresto from [name]  Effective dates: 07/05/22 through 07/06/23  Document scanned to chart. Attempted to reach out to patient directly, phone number not in service. Not sure if the patient is still receiving services at [facility]. Decided to call Trenda Moots in case he is still there, left voicemail with approval information.   Charlann Boxer, CPhT

## 2022-07-11 ENCOUNTER — Telehealth (HOSPITAL_COMMUNITY): Payer: Self-pay | Admitting: Licensed Clinical Social Worker

## 2022-07-11 NOTE — Telephone Encounter (Signed)
CSW received call from [facility] reporting that pt has been approved to receive Entresto through [name] for one year  Jorge Ny, [facility] Desk#: [phone] Cell#: [phone]

## 2022-07-16 ENCOUNTER — Ambulatory Visit: Payer: Self-pay | Admitting: Physician Assistant

## 2022-07-16 VITALS — BP 128/85 | HR 75 | Resp 19 | Ht 69.0 in | Wt 242.0 lb

## 2022-07-16 DIAGNOSIS — O019 Hydatidiform mole, unspecified: Secondary | ICD-10-CM

## 2022-07-16 DIAGNOSIS — I10 Essential (primary) hypertension: Secondary | ICD-10-CM

## 2022-07-16 DIAGNOSIS — F411 Generalized anxiety disorder: Secondary | ICD-10-CM

## 2022-07-16 DIAGNOSIS — F1410 Cocaine abuse, uncomplicated: Secondary | ICD-10-CM

## 2022-07-16 DIAGNOSIS — I509 Heart failure, unspecified: Secondary | ICD-10-CM

## 2022-07-16 DIAGNOSIS — D229 Melanocytic nevi, unspecified: Secondary | ICD-10-CM

## 2022-07-16 DIAGNOSIS — H538 Other visual disturbances: Secondary | ICD-10-CM

## 2022-07-16 DIAGNOSIS — E782 Mixed hyperlipidemia: Secondary | ICD-10-CM

## 2022-07-16 DIAGNOSIS — E1121 Type 2 diabetes mellitus with diabetic nephropathy: Secondary | ICD-10-CM

## 2022-07-16 MED ORDER — TIMOLOL MALEATE 0.5 % OP SOLN: 1.0000 [drp] | Freq: Every day | OPHTHALMIC | 1 refills | Status: DC

## 2022-07-16 MED ORDER — METFORMIN HCL ER 500 MG PO TB24: 500.0000 mg | ORAL_TABLET | Freq: Every day | ORAL | 1 refills | Status: DC

## 2022-07-16 MED ORDER — FUROSEMIDE 40 MG PO TABS: 40.0000 mg | ORAL_TABLET | Freq: Every day | ORAL | 1 refills | Status: DC

## 2022-07-16 MED ORDER — HYDROXYZINE HCL 25 MG PO TABS: 25.0000 mg | ORAL_TABLET | Freq: Three times a day (TID) | ORAL | 1 refills | Status: DC | PRN

## 2022-07-16 MED ORDER — ASPIRIN 81 MG PO TBEC: 81.0000 mg | DELAYED_RELEASE_TABLET | Freq: Every day | ORAL | 1 refills | Status: DC

## 2022-07-16 MED ORDER — ISOSORB DINITRATE-HYDRALAZINE 20-37.5 MG PO TABS: 1.0000 | ORAL_TABLET | Freq: Three times a day (TID) | ORAL | 1 refills | Status: DC

## 2022-07-16 MED ORDER — SPIRONOLACTONE 25 MG PO TABS
25.0000 mg | ORAL_TABLET | Freq: Every day | ORAL | 1 refills | Status: DC
Start: 2022-07-16 — End: 2022-08-13

## 2022-07-16 MED ORDER — ATORVASTATIN CALCIUM 80 MG PO TABS: 80.0000 mg | ORAL_TABLET | Freq: Every day | ORAL | 1 refills | Status: DC

## 2022-07-16 MED ORDER — MELATONIN 5 MG PO TABS: 5.0000 mg | ORAL_TABLET | Freq: Every day | ORAL | 1 refills | Status: DC

## 2022-07-16 MED ORDER — GABAPENTIN 100 MG PO CAPS: 100.0000 mg | ORAL_CAPSULE | Freq: Three times a day (TID) | ORAL | 1 refills | Status: DC

## 2022-07-16 MED ORDER — SERTRALINE HCL 100 MG PO TABS: 100.0000 mg | ORAL_TABLET | Freq: Every day | ORAL | 1 refills | Status: DC

## 2022-07-16 MED ORDER — EZETIMIBE 10 MG PO TABS: 10.0000 mg | ORAL_TABLET | Freq: Every day | ORAL | 1 refills | Status: DC

## 2022-07-16 NOTE — Patient Instructions (Signed)
I have started a referral for you to be seen by ophthalmology as well as dermatology.  It is very important that you work on completing the paperwork for [employer] health financial assistance.  Please let us know if there is anything else we can do for you.  Kennieth Rad, PA-C Physician Assistant [place] [email or web address]   Mole A mole is a colored (pigmented) growth on the skin. Moles are very common. They are usually harmless, but some moles can become cancerous over time. What are the causes? Moles are caused when pigmented skin cells grow together in clusters instead of spreading out in the skin as they normally do. The reason why the skin cells grow together in clusters is not known. What increases the risk? You are more likely to develop a mole if: You have family members who have moles. You are fair skinned. You have red or blond hair. You are often outdoors and exposed to the sun. You received phototherapy when you were a newborn baby. You are male. What are the signs or symptoms? A mole may occur anywhere on your skin. A mole may be: [place] or another color. Although moles are most often brown, they can also be tan, black, red, pink, blue, skin-toned, or colorless. Flat or raised. Smooth or wrinkled. Round in shape. How is this diagnosed? A mole is diagnosed with a skin exam. If your health care provider thinks a mole may be cancerous, all or part of the mole will be removed for testing (biopsy). How is this treated? Most moles are noncancerous (benign) and do not require treatment. If a mole is found to be cancerous, it will be removed. You may also choose to have a mole removed if it is causing pain or if you do not like the way it looks. Follow these instructions at home: General instructions  Every month, look for new moles and check your existing moles for changes. This is important because a change in a mole  can mean that the mole has become cancerous. ABCDE changes in a mole indicate that you should be evaluated by your health care provider. ABCDE stands for: Asymmetry. This means the mole has an irregular shape. It is not round or oval. Border. This means the mole has an irregular or bumpy border. Color. This means the mole has multiple colors in it, including brown, black, blue, red, or tan. Note that it is normal for moles to get darker when a woman is pregnant or takes birth control pills. Diameter. This means the mole is more than 0.2 inches (6 mm) across. Evolving. This refers to any unusual changes or symptoms in the mole, such as pain, itching, stinging, sensitivity, or bleeding. If you have a large number of moles, see a skin doctor (dermatologist) at least one time every year for a full-body skin check. Lifestyle  When you are outdoors, wear sunscreen with SPF 30 (sun protection factor 30) or higher. Use an adequate amount of sunscreen to cover exposed areas of skin. Put it on 30 minutes before you go out. Reapply it every 2 hours or anytime you come out of the water. When you are out in the sun, wear a broad-brimmed hat and clothing that covers your arms and legs. Wear wraparound sunglasses. Contact a health care provider if: The size, shape, borders, or color of your mole changes. Your mole, or the skin near the mole, becomes painful, sore, red, or swollen. Your mole: Develops more than one  color. Itches or bleeds. Becomes scaly, sheds skin, or oozes fluid. Becomes flat or develops raised areas. Becomes hard or soft. You develop a new mole. Summary A mole is a colored (pigmented) growth on the skin. Moles are very common. They are usually harmless, but some moles can become cancerous over time. Every month, look for new moles and check your existing moles for changes. This is important because a change in a mole can mean that the mole has become cancerous. If you have a large  number of moles, see a skin doctor (dermatologist) at least one time every year for a full-body skin check. When you are outdoors, wear sunscreen with SPF 30 (sun protection factor 30) or higher. Reapply it every 2 hours or anytime you come out of the water. Contact a health care provider if you notice changes in a mole or if you develop a new mole. This information is not intended to replace advice given to you by your health care provider. Make sure you discuss any questions you have with your health care provider. Document Revised: 08/24/2021 Document Reviewed: 08/24/2021 Elsevier Patient Education  [place].

## 2022-07-16 NOTE — Progress Notes (Unsigned)
   Established Patient Office Visit  Subjective   Patient ID: Matthew [name]., male    DOB: [birth year]  Age: 56 y.o. MRN: [id number]  No chief complaint on file.   [place] - soon  - [place]     1. Primary hypertension [facility] is unable to purchase this medication for patient.  We did reach out to [facility] as well as the social worker at heart care for further advice affordability/patient assistance.  Patient to follow-up with mobile team in 1 week.  Red flags given for prompt reevaluation - sacubitril-valsartan (ENTRESTO) 97-103 MG; Take 1 tablet by mouth 2 (two) times daily.  Dispense: 60 tablet; Refill: 1   2. Controlled type 2 diabetes mellitus with diabetic nephropathy, without long-term current use of insulin (HCC) A1c 6.6.  Will start metformin once daily, encouraged patient to work on modest weight loss, low sugar diet.  Encourage patient to check blood glucose levels once daily, keep a written log and have available for all office visits. - metFORMIN (GLUCOPHAGE-XR) 500 MG 24 hr tablet; Take 1 tablet (500 mg total) by mouth daily with breakfast.  Dispense: 30 tablet; Refill: 1 - POCT glucose (manual entry) - POCT glycosylated hemoglobin (Hb A1C)   3. Weight gain 50 pound weight gain since March 2023, 20 pounds in last 2 months   4. Blurry vision, right eye More than likely due to elevated blood sugars, we will continue to monitor   5. Chronic congestive heart failure, unspecified heart failure type (HCC) Continue current regimen, no refills needed today   6. Severe episode of recurrent major depressive disorder, without psychotic features ([place]) Continue current regimen, no refills needed today   7. GAD (generalized anxiety disorder)     8. Mixed hyperlipidemia Continue current regimen no refills needed today   9. Smoker     10. Cocaine abuse ([place]) Currently in substance abuse  treatment program   11. History of stroke     12. Class 2 severe obesity due to excess calories with serious comorbidity and body mass index (BMI) of 35.0 to 35.9 in adult (HCC)        {History (Optional):23778}  ROS    Objective:     There were no vitals taken for this visit. {Vitals History (Optional):23777}  Physical Exam   No results found for any visits on 07/16/22.  {Labs (Optional):23779}  The 10-year ASCVD risk score (Arnett DK, et al., 2019) is: 24.2%    Assessment & Plan:   Problem List Items Addressed This Visit   None   No follow-ups on file.    Loraine Grip Mayers, PA-C

## 2022-07-17 ENCOUNTER — Encounter: Payer: Self-pay | Admitting: Physician Assistant

## 2022-07-19 ENCOUNTER — Telehealth (HOSPITAL_COMMUNITY): Payer: Self-pay | Admitting: Licensed Clinical Social Worker

## 2022-07-19 ENCOUNTER — Encounter (HOSPITAL_COMMUNITY): Payer: Self-pay | Admitting: Cardiology

## 2022-07-19 ENCOUNTER — Ambulatory Visit (HOSPITAL_COMMUNITY)
Admission: RE | Admit: 2022-07-19 | Discharge: 2022-07-19 | Disposition: A | Discharge status: Home / Self Care | Payer: Medicaid Other | Source: Ambulatory Visit | Attending: Cardiology | Admitting: Cardiology

## 2022-07-19 VITALS — BP 140/80 | HR 79 | Wt 243.8 lb

## 2022-07-19 DIAGNOSIS — E782 Mixed hyperlipidemia: Secondary | ICD-10-CM | POA: Insufficient documentation

## 2022-07-19 DIAGNOSIS — I10 Essential (primary) hypertension: Secondary | ICD-10-CM

## 2022-07-19 DIAGNOSIS — R42 Dizziness and giddiness: Secondary | ICD-10-CM | POA: Insufficient documentation

## 2022-07-19 DIAGNOSIS — I428 Other cardiomyopathies: Secondary | ICD-10-CM | POA: Insufficient documentation

## 2022-07-19 DIAGNOSIS — N1831 Chronic kidney disease, stage 3a: Secondary | ICD-10-CM | POA: Insufficient documentation

## 2022-07-19 DIAGNOSIS — E785 Hyperlipidemia, unspecified: Secondary | ICD-10-CM | POA: Insufficient documentation

## 2022-07-19 DIAGNOSIS — G4733 Obstructive sleep apnea (adult) (pediatric): Secondary | ICD-10-CM | POA: Insufficient documentation

## 2022-07-19 DIAGNOSIS — F32A Depression, unspecified: Secondary | ICD-10-CM | POA: Insufficient documentation

## 2022-07-19 DIAGNOSIS — I5082 Biventricular heart failure: Secondary | ICD-10-CM | POA: Insufficient documentation

## 2022-07-19 DIAGNOSIS — Z8673 Personal history of transient ischemic attack (TIA), and cerebral infarction without residual deficits: Secondary | ICD-10-CM | POA: Insufficient documentation

## 2022-07-19 DIAGNOSIS — Z79899 Other long term (current) drug therapy: Secondary | ICD-10-CM | POA: Insufficient documentation

## 2022-07-19 DIAGNOSIS — Z87891 Personal history of nicotine dependence: Secondary | ICD-10-CM | POA: Insufficient documentation

## 2022-07-19 DIAGNOSIS — Z7984 Long term (current) use of oral hypoglycemic drugs: Secondary | ICD-10-CM | POA: Insufficient documentation

## 2022-07-19 DIAGNOSIS — F1410 Cocaine abuse, uncomplicated: Secondary | ICD-10-CM | POA: Insufficient documentation

## 2022-07-19 DIAGNOSIS — Z7982 Long term (current) use of aspirin: Secondary | ICD-10-CM | POA: Insufficient documentation

## 2022-07-19 DIAGNOSIS — I130 Hypertensive heart and chronic kidney disease with heart failure and stage 1 through stage 4 chronic kidney disease, or unspecified chronic kidney disease: Secondary | ICD-10-CM | POA: Insufficient documentation

## 2022-07-19 DIAGNOSIS — I5042 Chronic combined systolic (congestive) and diastolic (congestive) heart failure: Secondary | ICD-10-CM | POA: Insufficient documentation

## 2022-07-19 DIAGNOSIS — E1122 Type 2 diabetes mellitus with diabetic chronic kidney disease: Secondary | ICD-10-CM | POA: Insufficient documentation

## 2022-07-19 LAB — BASIC METABOLIC PANEL
Anion gap: 9 (ref 5–15)
BUN: 19 mg/dL (ref 6–20)
CO2: 24 mmol/L (ref 22–32)
Calcium: 9.4 mg/dL (ref 8.9–10.3)
Chloride: 104 mmol/L (ref 98–111)
Creatinine, Ser: 1.4 mg/dL — ABNORMAL HIGH (ref 0.61–1.24)
GFR, Estimated: 59 mL/min — ABNORMAL LOW (ref >60)
Glucose, Bld: 165 mg/dL — ABNORMAL HIGH (ref 70–99)
Potassium: 4.4 mmol/L (ref 3.5–5.1)
Sodium: 137 mmol/L (ref 135–145)

## 2022-07-19 LAB — BRAIN NATRIURETIC PEPTIDE: B Natriuretic Peptide: 7.6 pg/mL (ref 0.0–100.0)

## 2022-07-19 LAB — LIPID PANEL
Cholesterol: 83 mg/dL (ref 0–200)
HDL: 33 mg/dL — ABNORMAL LOW (ref >40)
LDL Cholesterol: 16 mg/dL (ref 0–99)
Total CHOL/HDL Ratio: 2.5 RATIO
Triglycerides: 169 mg/dL — ABNORMAL HIGH (ref <150)
VLDL: 34 mg/dL (ref 0–40)

## 2022-07-19 MED ORDER — CARVEDILOL 3.125 MG PO TABS: 3.1250 mg | ORAL_TABLET | Freq: Two times a day (BID) | ORAL | 3 refills | Status: DC

## 2022-07-19 MED ORDER — ENTRESTO 97-103 MG PO TABS: 1.0000 | ORAL_TABLET | Freq: Two times a day (BID) | ORAL | 3 refills | Status: DC

## 2022-07-19 MED ORDER — ENTRESTO 97-103 MG PO TABS: 1.0000 | ORAL_TABLET | Freq: Two times a day (BID) | ORAL | 1 refills | Status: DC

## 2022-07-19 NOTE — Patient Instructions (Signed)
START Carvedilol 3.'125mg'$  Twice daily  Labs done today, your results will be available in MyChart, we will contact you for abnormal readings.  Your physician has requested that you have an echocardiogram. Echocardiography is a painless test that uses sound waves to create images of your heart. It provides your doctor with information about the size and shape of your heart and how well your heart's chambers and valves are working. This procedure takes approximately one hour. There are no restrictions for this procedure.  Please follow up with our heart failure pharmacist in 3 weeks.  Your physician recommends that you schedule a follow-up appointment in: 3 months with echocardiogram ( November 2023)  **please call the office in September to arrange your follow up appointment.  If you have any questions or concerns before your next appointment please send [place] a message through [place] or call our office at [phone].    TO LEAVE A MESSAGE FOR THE NURSE SELECT OPTION 2, PLEASE LEAVE A MESSAGE INCLUDING: YOUR NAME DATE OF BIRTH CALL BACK NUMBER REASON FOR CALL**this is important as we prioritize the call backs  YOU WILL RECEIVE A CALL BACK THE SAME DAY AS LONG AS YOU CALL BEFORE 4:00 PM  At the [facility], you and your health needs are our priority. As part of our continuing mission to provide you with exceptional heart care, we have created designated Provider Care Teams. These Care Teams include your primary Cardiologist (physician) and Advanced Practice Providers (APPs- Physician Assistants and Nurse Practitioners) who all work together to provide you with the care you need, when you need it.   You may see any of the following providers on your designated Care Team at your next follow up: Dr Glori Bickers Dr Haynes Kerns, NP Lyda Jester, [place] [facility] [place], [place] Audry Riles, PharmD   Please be sure to bring in all your medications  bottles to every appointment.

## 2022-07-19 NOTE — Telephone Encounter (Signed)
CSW received call from [place] with [facility] recovery.  She states that pt will make it to appt today but needed to verify correct Entresto dosage.  CSW helped to confirm.  [place] Board reports that initial shipment of Entresto was sent 49-'51mg'$  but pt is supposed to be on 97-103.  CSW explained that likely when application was initiated several months ago that was the correct dose but has since been adjusted.  Pt has appt today so informed RN staff to send in correct dose following appt if MD confirms he needs to remain on 97-103.  Will continue to follow and assist as needed  Jorge Ny, [facility] Desk#: [phone] Cell#: [phone]

## 2022-07-19 NOTE — Progress Notes (Signed)
Advanced Heart Failure Clinic Note    PCP: Charlott Rakes, MD HF Cardiologist: Dr. Aundra Dubin  Reason for Visit: F/u for chronic systolic heart failure   HPI: Matthew Petersonis a 56 y.o. with PMH significant for combined systolic CHF with biventricular failure, T2DM, HTN, OSA, tobacco use, HLD, cocaine use, poor medication adherence, depression,  CVA, and CKD 3a.     He was first admitted with HF in 2015 in the setting of long standing uncontrolled hypertension and smoking.  Had a LHC with normal cors.    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  Was not followed by cardiology or hospitalized for CHF during this time period.     March 2021 he began having CHF exacerbations after running out of meds between admissions.  Left AMA in March. Another similar admission in July, he was out of his medications at that time as well.     Admitted 12/2020 with CHF exacerbation after running out of his medications.  He was diuresed, put on an excellent medication regimen. He also endorsed suicidal ideation at that time and was subsequently admitted to behavioral health. UDS was positive for cocaine, echo showed EF < 20% with moderate RV dysfunction.   He was seen in the HF clinic 02/08/21 and lasix was cut back to 20 mg daily, later Lasix was stopped.   Echo 5/22 with EF improved to 50%.   Follow up 6/22, [name] was stopped due to dizziness.  Follow up 9/22 and was fluid overloaded and SOB. Had ran out of all of his meds, including lasix ~3 months prior. ReDs 46%. Given Lasix 80 mg IV x 1 in the office, GDMT adjusted. Instructed to f/u in 1 week but no showed to the appt.   Follow up 11/22 he had been off several medications, more SOB, continued to use cocaine. Mediations restarted, pillbox filled. Seen back 12/22, stable NYHA III, volume OK ReDs 35%. Arlyce Harman restarted and advised 4 week follow up. No show x 2 appts.   Echo in 1/23 with EF  30-35%, mild LVH, normal RV.   Admitted 3/23 with SI and auditory hallucinations.   Today he returns for HF follow up. He is now living in a [place] facility. He is not smoking, drinking, or using drugs now.  No significant exertional dyspnea or chest pain.  No orthopnea/PND.  Rare lightheadedness with rapid standing.  He does not have his CPAP at the facility.   ECG (personally reviewed): NSR, inferolateral TWIs, QTc 468 msec  Labs (5/22): K 3.9, creatinine 1.6 Labs (6/22): K 3.8, creatinine 1.49 Labs (8/22): K 4.6 creatinine 1.25 Labs (9/22): K 4.1, creatinine 1.29, LDL 40  Labs (11/22): K 3.7 creatinine 1.91 Labs (3/23): K 4.2, creatinine 1.2, LDL 101, HDL 54 Labs (5/23): K 3.6, creatinine 1.23  PMH: 1. HTN 2. Type 2 diabetes 3. H/o cocaine abuse: Positive UDS 1/22.  4. CVA 2018 5. OSA: Uses CPAP.  6. Chronic systolic CHF: Nonischemic cardiomyopathy.  - LHC (2015): Normal coronaries.  - Echo (2021): EF 40-45%. - Echo (1/22): EF <20%, moderate LV dilation, moderately decreased RV systolic function, severe LAE.  - Echo (5/22): EF 50%, moderate LV dilation, normal RV.  - Echo (1/23): EF 30-35%, moderately decreased LV, normal RV, ascending aorta 39 mm 7. Depression 8. CKD stage 3 9. Hyperlipidemia  Current Outpatient Medications  Medication Sig Dispense Refill   aspirin EC 81 MG tablet Take 1 tablet (81  mg total) by mouth daily. Swallow whole. 30 tablet 1   atorvastatin (LIPITOR) 80 MG tablet Take 1 tablet (80 mg total) by mouth daily. 30 tablet 1   carvedilol (COREG) 3.125 MG tablet Take 1 tablet (3.125 mg total) by mouth 2 (two) times daily. 180 tablet 3   ezetimibe (ZETIA) 10 MG tablet Take 1 tablet (10 mg total) by mouth daily. 30 tablet 1   furosemide (LASIX) 40 MG tablet Take 1 tablet (40 mg total) by mouth daily. 30 tablet 1   gabapentin (NEURONTIN) 100 MG capsule Take 1 capsule (100 mg total) by mouth 3 (three) times daily. 90 capsule 1   hydrOXYzine (ATARAX) 25 MG  tablet Take 1 tablet (25 mg total) by mouth 3 (three) times daily as needed for anxiety. 60 tablet 1   isosorbide-hydrALAZINE (BIDIL) 20-37.5 MG tablet Take 1 tablet by mouth 3 (three) times daily. 270 tablet 1   melatonin 5 MG TABS Take 1 tablet (5 mg total) by mouth at bedtime. 30 tablet 1   metFORMIN (GLUCOPHAGE-XR) 500 MG 24 hr tablet Take 1 tablet (500 mg total) by mouth daily with breakfast. 30 tablet 1   sertraline (ZOLOFT) 100 MG tablet Take 1 tablet (100 mg total) by mouth daily. 30 tablet 1   spironolactone (ALDACTONE) 25 MG tablet Take 1 tablet (25 mg total) by mouth daily. 30 tablet 1   timolol (TIMOPTIC) 0.5 % ophthalmic solution Place 1 drop into both eyes daily after breakfast. 5 mL 1   sacubitril-valsartan (ENTRESTO) 97-103 MG Take 1 tablet by mouth 2 (two) times daily. 180 tablet 3   No current facility-administered medications for this encounter.   No Known Allergies  Social History   Socioeconomic History   Marital status: Widowed    Spouse name: Not on file   Number of children: 2   Years of education: 14   Highest education level: Not on file  Occupational History   Occupation: [employer]  Tobacco Use   Smoking status: Former    Years: 20.00    Types: Cigarettes   Smokeless tobacco: Former    Quit date: 11/14/2020  Vaping Use   Vaping Use: Never used  Substance and Sexual Activity   Alcohol use: No   Drug use: No   Sexual activity: Yes  Other Topics Concern   Not on file  Social History Narrative   Lives with 2 young children in [place].  Wife died in 2020-05-23.  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   Left-handed   Caffeine: occasional tea   Social Determinants of Health   Financial Resource Strain: High Risk (01/24/2021)   Overall Financial Resource Strain (CARDIA)    Difficulty of Paying Living Expenses: Very hard  Food Insecurity: Food Insecurity Present (01/24/2021)   Hunger Vital Sign    Worried About Running Out of Food  in the Last Year: Sometimes true    Ran Out of Food in the Last Year: Sometimes true  Transportation Needs: Unmet Transportation Needs (01/24/2021)   PRAPARE - Hydrologist (Medical): Yes    Lack of Transportation (Non-Medical): Yes  Physical Activity: Not on file  Stress: Stress Concern Present (01/24/2021)   [place]    Feeling of Stress : Very much  Social Connections: Not on file  Intimate Partner Violence: Not on file   Family History  Problem Relation Age of Onset  Heart attack Father        died @ 58   Cervical cancer Mother        died @ 1   BP (!) 140/80   Pulse 79   Wt 110.6 kg (243 lb 12.8 oz)   SpO2 97%   BMI 36.00 kg/m   Wt Readings from Last 3 Encounters:  07/19/22 110.6 kg (243 lb 12.8 oz)  07/16/22 109.8 kg (242 lb)  06/27/22 108.4 kg (239 lb)   PHYSICAL EXAM: General: NAD Neck: No JVD, no thyromegaly or thyroid nodule.  Lungs: Clear to auscultation bilaterally with normal respiratory effort. CV: Nondisplaced PMI.  Heart regular S1/S2, no S3/S4, no murmur.  No peripheral edema.  No carotid bruit.  Normal pedal pulses.  Abdomen: Soft, nontender, no hepatosplenomegaly, no distention.  Skin: Intact without lesions or rashes.  Neurologic: Alert and oriented x 3.  Psych: Normal affect. Extremities: No clubbing or cyanosis.  HEENT: Normal.   ASSESSMENT & PLAN: 1. 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  Due to poorly controlled HTN +/- cocaine abuse.  Normal coronaries on 2015 cath.  Echo in 1/22 showed EF < 20% with moderate RV dysfunction (off meds, + cocaine).  Echo in 5/22 with EF up to 50% (on meds and off cocaine). Echo 1/23 (off meds and on cocaine) EF down to 30-35%. He is now living at [facility] and is off drugs and ETOH.  NYHA class I, not volume overloaded on exam.  - Continue Entresto 97/103 mg bid. - Continue spironolactone 25 mg daily.  - Add  Coreg 3.125 mg bid.  - Continue BiDil 1 tab tid. - Did not tolerate Farxiga due to dizziness  - EF previously out of ICD range, most recent echo EF back down => hopefully improved off cocaine and ETOH. Repeat echo in 3 months.  2. HTN: BP mildly elevated, starting Coreg.  3. OSA: He does not have his CPAP at [facility].  Will need to see if they can provide him with one.  4. Tobacco Use Disorder: He has quit smoking.  5. Cocaine Use Disorder:  He has quit using cocaine.   6. CKD 3: Check BMET today  7. Hyperlipidemia: Continue atorvastatin.  - Check lipids today.  8. Depression: Controlled.  9. T2DM: management per PCP. - Did not tolerate Farxiga due to dizziness. 10. 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  Followup in 3 months with echo  Loralie Champagne, MD 07/19/22

## 2022-07-26 NOTE — Progress Notes (Signed)
Advanced Heart Failure Clinic Note   PCP: Matthew Rakes, MD HF Cardiologist: Dr. Aundra [name]  HPI:  Matthew [name]. is a 56 y.o. with PMH significant for combined systolic CHF with biventricular failure, T2DM, HTN, OSA, tobacco use, HLD, cocaine use, poor medication adherence, depression, CVA, and CKD 3a.      He was first admitted with HF in 2015 in the setting of long standing uncontrolled hypertension and smoking.  Had a LHC with normal cors.    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  Was not followed by cardiology or hospitalized for CHF during this time period.   March 2021 he began having CHF exacerbations after running out of meds between admissions.  Left AMA in March. Another similar admission in July, he was out of his medications at that time as well.     Admitted 12/2020 with CHF exacerbation after running out of his medications.  He was diuresed, put on an excellent medication regimen. He also endorsed suicidal ideation at that time and was subsequently admitted to behavioral health. UDS was positive for cocaine, echo showed EF < 20% with moderate RV dysfunction.    He was seen in the HF clinic 02/08/21 and Lasix was cut back to 20 mg daily, later Lasix was stopped.    Echo 04/2021 with EF improved to 50%.    Follow up 05/2021, Matthew [name] was stopped due to dizziness.   Follow up 08/2021 and was fluid overloaded and SOB. Had ran out of all of his meds, including Lasix ~3 months prior. ReDs 46%. Given Lasix 80 mg IV x 1 in the office, GDMT adjusted. Instructed to f/u in 1 week but no showed to the appt.    Follow up 10/2021 he had been off several medications, more SOB, continued to use cocaine. Mediations restarted, pillbox filled. Seen back 11/2021, stable NYHA III, volume OK, ReDs 35%. Spironolactone was restarted and advised 4 week follow up. No show x 2 appts.    Echo in 12/2021 with EF 30-35%, mild LVH, normal RV.     Admitted 02/2022 with SI and auditory hallucinations.    Returned to [name] for HF follow up 07/19/22. He was living in a [name]. He endorsed that he was not not smoking, drinking, or using drugs.  No significant exertional dyspnea or chest pain.  No orthopnea/PND.  Rare lightheadedness with rapid standing.  He does not have his CPAP at the facility.   Today he returns to HF clinic for pharmacist medication titration. At last visit with MD carvedilol 3.125 mg BID was initiated. Overall he is feeling ok today. Still at [name] and doing well. States he is feeling better overall but does get dizzy when standing up and after taking morning medications. This lasts for a few minutes before resolving. He states it is maybe a little worse since starting the carvedilol but he has noticed it for a while. He notes blurry vision, which is constant, out of his right eye which is bothersome. He states he has an appointment to see an optometrist soon.  No CP or palpitations.  No significant SOB. Weight has been increasing, which he attributes to eating more at the facility. No LEE or abdominal bloating. No PND/orthopnea. Takes Lasix 40 mg daily. Notes he has to urinate "all the time" which is bothersome and notes he has been more thirsty recently. He brought in BP log from facility.  SBP have been 130s-140s and HR in the 70s over the last few weeks. BP in clinic today 140/82.   HF Medications: Carvedilol 3.125 mg BID Entresto 97/103 mg BID - currently taking 2 tablets of the 49/51 mg strength BID.  Spironolactone 25 mg daily Bidil 1 tablet TID Lasix 40 mg daily  Has the patient been experiencing any side effects to the medications prescribed? Notes dizziness after medications.   Does the patient have any problems obtaining medications due to transportation or finances?  No insurance. Gets most medications through [name]. Matthew [name] from [name] [facility] and Bidil from [name].    Understanding of regimen: good Understanding of indications: good Potential of compliance: good Patient understands to avoid NSAIDs. Patient understands to avoid decongestants.    Pertinent Lab Values: 07/19/22: Serum creatinine 1.40, BUN 19, Potassium 4.4, Sodium 137, BNP 7.6  Vital Signs: Weight: 247.8 lbs (last clinic weight: 243.8 lbs) Blood pressure: 140/82  Heart rate: 69   Assessment/Plan: 1. Chronic, HFrEF: Nonischemic cardiomyopathy.  Due to poorly controlled HTN +/- cocaine abuse.  Normal coronaries on 2015 cath.  Echo in 12/2020 showed EF < 20% with moderate RV dysfunction (off meds, + cocaine).  Echo in 04/2021 with EF up to 50% (on meds and off cocaine). Echo 12/2021 (off meds and on cocaine) EF down to 30-35%. He is now living at [name] and is off drugs and ETOH.   - NYHA class I, not volume overloaded and likely dry today.  - Decrease Lasix to 20 mg daily - Increase carvedilol to 6.25 mg BID - Continue Entresto 97/103 mg BID. - Continue spironolactone 25 mg daily.  - Continue BiDil 1 tab TID. - Did not tolerate Farxiga due to dizziness  - EF previously out of ICD range, most recent echo EF back down => hopefully improved off cocaine and ETOH. Repeat echo 10/2022 2. HTN: BP mildly elevated, increase carvedilol as above.  3. OSA: He does not have his CPAP at [name].  Will need to see if they can provide him with one.  4. Tobacco Use Disorder: He has quit smoking.  5. Cocaine Use Disorder:  He has quit using cocaine.   6. CKD 3:  - Scr 1.40 on last check 07/19/22 7. Hyperlipidemia: Continue atorvastatin.  - Lipids ok on 07/19/22, LDL 16 8. Depression: Controlled.  9. T2DM: management per PCP. - Did not tolerate Farxiga due to dizziness. 10. 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  Follow up 3 months with Dr. Domingo Dimes.    Matthew [name], PharmD, BCPS, BCCP, CPP Heart Failure Clinic  Pharmacist [phone]

## 2022-08-06 ENCOUNTER — Telehealth: Payer: Self-pay | Admitting: Pharmacist

## 2022-08-06 NOTE — Telephone Encounter (Addendum)
Patient attempted to be outreached by Berdie Ogren, PharmD Candidate on 08/06/2022 to discuss hypertension. Patient's voicemail was unavailable.   Patient has an upcoming appointment with clinical pharmacist at the [place] Failure clinic.  Catie Hedwig Morton, PharmD, [facility]  [phone]

## 2022-08-09 ENCOUNTER — Ambulatory Visit (HOSPITAL_COMMUNITY)
Admission: RE | Admit: 2022-08-09 | Discharge: 2022-08-09 | Disposition: A | Discharge status: Home / Self Care | Payer: Medicaid Other | Source: Ambulatory Visit | Attending: Cardiology | Admitting: Cardiology

## 2022-08-09 VITALS — BP 140/82 | HR 69 | Wt 247.8 lb

## 2022-08-09 DIAGNOSIS — I5022 Chronic systolic (congestive) heart failure: Secondary | ICD-10-CM

## 2022-08-09 DIAGNOSIS — I1310 Hypertensive heart and chronic kidney disease without heart failure, with stage 1 through stage 4 chronic kidney disease, or unspecified chronic kidney disease: Secondary | ICD-10-CM | POA: Insufficient documentation

## 2022-08-09 DIAGNOSIS — Z8673 Personal history of transient ischemic attack (TIA), and cerebral infarction without residual deficits: Secondary | ICD-10-CM | POA: Insufficient documentation

## 2022-08-09 DIAGNOSIS — F1410 Cocaine abuse, uncomplicated: Secondary | ICD-10-CM | POA: Insufficient documentation

## 2022-08-09 DIAGNOSIS — E1122 Type 2 diabetes mellitus with diabetic chronic kidney disease: Secondary | ICD-10-CM | POA: Insufficient documentation

## 2022-08-09 DIAGNOSIS — I509 Heart failure, unspecified: Secondary | ICD-10-CM

## 2022-08-09 DIAGNOSIS — E785 Hyperlipidemia, unspecified: Secondary | ICD-10-CM | POA: Insufficient documentation

## 2022-08-09 DIAGNOSIS — Z87891 Personal history of nicotine dependence: Secondary | ICD-10-CM | POA: Insufficient documentation

## 2022-08-09 DIAGNOSIS — I10 Essential (primary) hypertension: Secondary | ICD-10-CM

## 2022-08-09 DIAGNOSIS — Z79899 Other long term (current) drug therapy: Secondary | ICD-10-CM | POA: Insufficient documentation

## 2022-08-09 DIAGNOSIS — Z7901 Long term (current) use of anticoagulants: Secondary | ICD-10-CM | POA: Insufficient documentation

## 2022-08-09 DIAGNOSIS — R42 Dizziness and giddiness: Secondary | ICD-10-CM | POA: Insufficient documentation

## 2022-08-09 DIAGNOSIS — N1831 Chronic kidney disease, stage 3a: Secondary | ICD-10-CM | POA: Insufficient documentation

## 2022-08-09 DIAGNOSIS — G4733 Obstructive sleep apnea (adult) (pediatric): Secondary | ICD-10-CM | POA: Insufficient documentation

## 2022-08-09 MED ORDER — FUROSEMIDE 40 MG PO TABS: 20.0000 mg | ORAL_TABLET | Freq: Every day | ORAL | 3 refills | Status: DC

## 2022-08-09 MED ORDER — CARVEDILOL 6.25 MG PO TABS: 6.2500 mg | ORAL_TABLET | Freq: Two times a day (BID) | ORAL | 3 refills | Status: DC

## 2022-08-09 MED ORDER — ENTRESTO 97-103 MG PO TABS: 1.0000 | ORAL_TABLET | Freq: Two times a day (BID) | ORAL | 3 refills | Status: DC

## 2022-08-09 NOTE — Patient Instructions (Signed)
It was a pleasure seeing you today!  MEDICATIONS: -We are changing your medications today -Increase carvedilol to 6.25 mg (1 tablet) twice daily. You may take 2 tablets of the 3.125 mg strength twice daily until you receive the new dose.   -Decrease Lasix to 20 mg (1/2 tablet) daily -Call if you have questions about your medications.   NEXT APPOINTMENT: Return to clinic in 3 months with Dr. Aundra Dubin.  In general, to take care of your heart failure: -Limit your fluid intake to 2 Liters (half-gallon) per day.   -Limit your salt intake to ideally 2-3 grams (2000-3000 mg) per day. -Weigh yourself daily and record, and bring that "weight diary" to your next appointment.  (Weight gain of 2-3 pounds in 1 day typically means fluid weight.) -The medications for your heart are to help your heart and help you live longer.   -Please contact us before stopping any of your heart medications.  Call the clinic at [phone] with questions or to reschedule future appointments.

## 2022-08-13 ENCOUNTER — Ambulatory Visit: Payer: Medicaid Other | Attending: Family Medicine | Admitting: Family Medicine

## 2022-08-13 ENCOUNTER — Encounter: Payer: Self-pay | Admitting: Family Medicine

## 2022-08-13 VITALS — BP 139/83 | HR 72 | Temp 98.3°F | Ht 69.0 in | Wt 245.6 lb

## 2022-08-13 DIAGNOSIS — H409 Unspecified glaucoma: Secondary | ICD-10-CM | POA: Insufficient documentation

## 2022-08-13 DIAGNOSIS — R45851 Suicidal ideations: Secondary | ICD-10-CM | POA: Insufficient documentation

## 2022-08-13 DIAGNOSIS — Z1159 Encounter for screening for other viral diseases: Secondary | ICD-10-CM | POA: Insufficient documentation

## 2022-08-13 DIAGNOSIS — Z87891 Personal history of nicotine dependence: Secondary | ICD-10-CM | POA: Insufficient documentation

## 2022-08-13 DIAGNOSIS — I110 Hypertensive heart disease with heart failure: Secondary | ICD-10-CM | POA: Insufficient documentation

## 2022-08-13 DIAGNOSIS — E1169 Type 2 diabetes mellitus with other specified complication: Secondary | ICD-10-CM | POA: Insufficient documentation

## 2022-08-13 DIAGNOSIS — I5042 Chronic combined systolic (congestive) and diastolic (congestive) heart failure: Secondary | ICD-10-CM | POA: Insufficient documentation

## 2022-08-13 DIAGNOSIS — F419 Anxiety disorder, unspecified: Secondary | ICD-10-CM | POA: Insufficient documentation

## 2022-08-13 DIAGNOSIS — Z79899 Other long term (current) drug therapy: Secondary | ICD-10-CM | POA: Insufficient documentation

## 2022-08-13 DIAGNOSIS — I152 Hypertension secondary to endocrine disorders: Secondary | ICD-10-CM

## 2022-08-13 DIAGNOSIS — E1159 Type 2 diabetes mellitus with other circulatory complications: Secondary | ICD-10-CM

## 2022-08-13 DIAGNOSIS — Z8673 Personal history of transient ischemic attack (TIA), and cerebral infarction without residual deficits: Secondary | ICD-10-CM | POA: Insufficient documentation

## 2022-08-13 DIAGNOSIS — Z23 Encounter for immunization: Secondary | ICD-10-CM | POA: Insufficient documentation

## 2022-08-13 DIAGNOSIS — F32A Depression, unspecified: Secondary | ICD-10-CM | POA: Insufficient documentation

## 2022-08-13 DIAGNOSIS — E785 Hyperlipidemia, unspecified: Secondary | ICD-10-CM | POA: Insufficient documentation

## 2022-08-13 DIAGNOSIS — I5043 Acute on chronic combined systolic (congestive) and diastolic (congestive) heart failure: Secondary | ICD-10-CM

## 2022-08-13 DIAGNOSIS — Z1211 Encounter for screening for malignant neoplasm of colon: Secondary | ICD-10-CM | POA: Insufficient documentation

## 2022-08-13 DIAGNOSIS — Z7984 Long term (current) use of oral hypoglycemic drugs: Secondary | ICD-10-CM | POA: Insufficient documentation

## 2022-08-13 DIAGNOSIS — G4730 Sleep apnea, unspecified: Secondary | ICD-10-CM | POA: Insufficient documentation

## 2022-08-13 DIAGNOSIS — E1121 Type 2 diabetes mellitus with diabetic nephropathy: Secondary | ICD-10-CM | POA: Insufficient documentation

## 2022-08-13 MED ORDER — ATORVASTATIN CALCIUM 80 MG PO TABS: 80.0000 mg | ORAL_TABLET | Freq: Every day | ORAL | 1 refills | Status: DC

## 2022-08-13 MED ORDER — SERTRALINE HCL 100 MG PO TABS
100.0000 mg | ORAL_TABLET | Freq: Every day | ORAL | 1 refills | Status: DC
Start: 2022-08-13 — End: 2023-11-04

## 2022-08-13 MED ORDER — GABAPENTIN 300 MG PO CAPS: 300.0000 mg | ORAL_CAPSULE | Freq: Three times a day (TID) | ORAL | 3 refills | Status: DC

## 2022-08-13 MED ORDER — EZETIMIBE 10 MG PO TABS: 10.0000 mg | ORAL_TABLET | Freq: Every day | ORAL | 1 refills | Status: DC

## 2022-08-13 MED ORDER — SPIRONOLACTONE 25 MG PO TABS
25.0000 mg | ORAL_TABLET | Freq: Every day | ORAL | 1 refills | Status: DC
Start: 2022-08-13 — End: 2023-11-20

## 2022-08-13 MED ORDER — METFORMIN HCL ER 500 MG PO TB24: 500.0000 mg | ORAL_TABLET | Freq: Every day | ORAL | 1 refills | Status: DC

## 2022-08-13 NOTE — Progress Notes (Signed)
Subjective:  Patient ID: Matthew Heman., male    DOB: December 06, 1966  Age: 56 y.o. MRN: [id number]  CC: Diabetes   HPI Matthew [name] is a 56 y.o. 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  Interval History: [facility] his Depression as he did have suicidal thoughts.  He reports doing a lot better since he has been there and will be discharged soon.  He Complains of drowsiness after taking his medications. Med list reveals he is on Gabapentin  Seen at the CHF clinic earlier this month and Coreg added to his regimen due to mildly elevated blood pressure.  He denies presence of dyspnea, pedal edema, weight gain, orthopnea. His diabetes is controlled and he denies any hypoglycemic symptoms, visual concerns or neuropathy.  Previously unable to tolerate SGLT2 inhibitor due to dizziness. Past Medical History:  Diagnosis Date   Acute combined systolic and diastolic CHF, NYHA class 3 ([place])    a. 02/2014 Echo: EF 25-30%.   Cardiomyopathy ([place])    a. 02/2014 Echo: EF 25-30%, sev glob HK with inferolat HK->AK, mod conc LVH, Gr 2 DD, Mild MR, sev dil LA.   Depression    DM (diabetes mellitus) ([place])    High cholesterol    Hypertension    Morbid obesity ([place])    Tobacco abuse     Past Surgical History:  Procedure Laterality Date   CATARACT EXTRACTION Right    LEFT HEART CATHETERIZATION WITH CORONARY ANGIOGRAM N/A 03/04/2014   Procedure: LEFT HEART CATHETERIZATION WITH CORONARY ANGIOGRAM;  Surgeon: Burnell Blanks, MD;  Location: [facility] CATH LAB;  Service: Cardiovascular;  Laterality: N/A;   TEE WITHOUT CARDIOVERSION N/A 02/04/2017   Procedure: TRANSESOPHAGEAL ECHOCARDIOGRAM (TEE);   Surgeon: Dorothy Spark, MD;  Location: [facility] ENDOSCOPY;  Service: Cardiovascular;  Laterality: N/A;    Family History  Problem Relation Age of Onset   Heart attack Father        died @ 48   Cervical cancer Mother        died @ 42    Social History   Socioeconomic History   Marital status: Widowed    Spouse name: Not on file   Number of children: 2   Years of education: 14   Highest education level: Not on file  Occupational History   Occupation: [employer]  Tobacco Use   Smoking status: Former    Years: 20.00    Types: Cigarettes   Smokeless tobacco: Former    Quit date: 11/14/2020  Vaping Use   Vaping Use: Never used  Substance and Sexual Activity   Alcohol use: No   Drug use: No   Sexual activity: Yes  Other Topics Concern   Not on file  Social History Narrative   Lives with 2 young children in [place].  Wife died in May 13, 2020.  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   Left-handed   Caffeine: occasional tea   Social Determinants of Health   Financial Resource Strain: High Risk (01/24/2021)   Overall Financial Resource Strain (CARDIA)    Difficulty of Paying Living Expenses: Very hard  Food Insecurity: Food Insecurity Present (01/24/2021)  Hunger Vital Sign    Worried About Running Out of Food in the Last Year: Sometimes true    Ran Out of Food in the Last Year: Sometimes true  Transportation Needs: Unmet Transportation Needs (01/24/2021)   PRAPARE - Hydrologist (Medical): Yes    Lack of Transportation (Non-Medical): Yes  Physical Activity: Not on file  Stress: Stress Concern Present (01/24/2021)   [place]    Feeling of Stress : Very much  Social Connections: Not on file    No Known Allergies  Outpatient Medications Prior to Visit  Medication Sig Dispense Refill   aspirin EC 81 MG tablet Take 1 tablet (81 mg total) by mouth daily. Swallow  whole. 30 tablet 1   carvedilol (COREG) 6.25 MG tablet Take 1 tablet (6.25 mg total) by mouth 2 (two) times daily. 180 tablet 3   furosemide (LASIX) 40 MG tablet Take 0.5 tablets (20 mg total) by mouth daily. 45 tablet 3   hydrOXYzine (ATARAX) 25 MG tablet Take 1 tablet (25 mg total) by mouth 3 (three) times daily as needed for anxiety. 60 tablet 1   isosorbide-hydrALAZINE (BIDIL) 20-37.5 MG tablet Take 1 tablet by mouth 3 (three) times daily. 270 tablet 1   melatonin 5 MG TABS Take 1 tablet (5 mg total) by mouth at bedtime. 30 tablet 1   sacubitril-valsartan (ENTRESTO) 97-103 MG Take 1 tablet by mouth 2 (two) times daily. 180 tablet 3   timolol (TIMOPTIC) 0.5 % ophthalmic solution Place 1 drop into both eyes daily after breakfast. 5 mL 1   atorvastatin (LIPITOR) 80 MG tablet Take 1 tablet (80 mg total) by mouth daily. 30 tablet 1   ezetimibe (ZETIA) 10 MG tablet Take 1 tablet (10 mg total) by mouth daily. 30 tablet 1   gabapentin (NEURONTIN) 100 MG capsule Take 1 capsule (100 mg total) by mouth 3 (three) times daily. 90 capsule 1   metFORMIN (GLUCOPHAGE-XR) 500 MG 24 hr tablet Take 1 tablet (500 mg total) by mouth daily with breakfast. 30 tablet 1   sertraline (ZOLOFT) 100 MG tablet Take 1 tablet (100 mg total) by mouth daily. 30 tablet 1   spironolactone (ALDACTONE) 25 MG tablet Take 1 tablet (25 mg total) by mouth daily. 30 tablet 1   No facility-administered medications prior to visit.     ROS Review of Systems  Constitutional:  Negative for activity change and appetite change.  HENT:  Negative for sinus pressure and sore throat.   Respiratory:  Negative for chest tightness, shortness of breath and wheezing.   Cardiovascular:  Negative for chest pain and palpitations.  Gastrointestinal:  Negative for abdominal distention, abdominal pain and constipation.  Genitourinary: Negative.   Musculoskeletal: Negative.   Psychiatric/Behavioral:  Negative for behavioral problems and dysphoric  mood.     Objective:  BP 139/83   Pulse 72   Temp 98.3 F (36.8 C) (Oral)   Ht '5\' 9"'$  (1.753 m)   Wt 245 lb 9.6 oz (111.4 kg)   SpO2 97%   BMI 36.27 kg/m      08/13/2022    2:33 PM 08/09/2022   12:54 PM 07/19/2022   11:27 AM  BP/Weight  Systolic BP 939 030 092  Diastolic BP 83 82 80  Wt. (Lbs) 245.6 247.8 243.8  BMI 36.27 kg/m2 36.59 kg/m2 36 kg/m2      Physical Exam Constitutional:      Appearance: He is well-developed.  Cardiovascular:     Rate and Rhythm: Normal rate.     Heart sounds: Normal heart sounds. No murmur heard. Pulmonary:     Effort: Pulmonary effort is normal.     Breath sounds: Normal breath sounds. No wheezing or rales.  Chest:     Chest wall: No tenderness.  Abdominal:     General: Bowel sounds are normal. There is no distension.     Palpations: Abdomen is soft. There is no mass.     Tenderness: There is no abdominal tenderness.  Musculoskeletal:        General: Normal range of motion.     Right lower leg: No edema.     Left lower leg: No edema.  Neurological:     Mental Status: He is alert and oriented to person, place, and time.  Psychiatric:        Mood and Affect: Mood normal.        Latest Ref Rng & Units 07/19/2022   12:07 PM 04/23/2022    8:35 AM 04/21/2022    1:00 PM  CMP  Glucose 70 - 99 mg/dL 165  189  105   BUN 6 - 20 mg/dL '19  7  8   '$ Creatinine 0.61 - 1.24 mg/dL 1.40  1.23  1.15   Sodium 135 - 145 mmol/L 137  139  138   Potassium 3.5 - 5.1 mmol/L 4.4  3.6  3.3   Chloride 98 - 111 mmol/L 104  103  105   CO2 22 - 32 mmol/L '24  26  26   '$ Calcium 8.9 - 10.3 mg/dL 9.4  9.0  8.9   Total Protein 6.5 - 8.1 g/dL  6.7  6.7   Total Bilirubin 0.3 - 1.2 mg/dL  1.0  0.9   Alkaline Phos 38 - 126 U/L  58  58   AST 15 - 41 U/L  23  46   ALT 0 - 44 U/L  32  46     Lipid Panel     Component Value Date/Time   CHOL 83 07/19/2022 1202   CHOL 144 03/07/2018 0834   TRIG 169 (H) 07/19/2022 1202   HDL 33 (L) 07/19/2022 1202   HDL 36 (L)  03/07/2018 0834   CHOLHDL 2.5 07/19/2022 1202   VLDL 34 07/19/2022 1202   LDLCALC 16 07/19/2022 1202   LDLCALC 73 03/07/2018 0834    CBC    Component Value Date/Time   WBC 5.5 04/21/2022 1300   RBC 4.69 04/21/2022 1300   HGB 14.1 04/21/2022 1300   HCT 42.0 04/21/2022 1300   PLT 218 04/21/2022 1300   MCV 89.6 04/21/2022 1300   MCH 30.1 04/21/2022 1300   MCHC 33.6 04/21/2022 1300   RDW 13.2 04/21/2022 1300   LYMPHSABS 1.5 04/21/2022 1300   MONOABS 0.6 04/21/2022 1300   EOSABS 0.1 04/21/2022 1300   BASOSABS 0.0 04/21/2022 1300    Lab Results  Component Value Date   HGBA1C 6.5 (A) 06/27/2022    Assessment & Plan:  1. Controlled type 2 diabetes mellitus with diabetic nephropathy, without long-term current use of insulin (HCC) Controlled with A1c of 6.5 Counseled on Diabetic diet, my plate method, 295 minutes of moderate intensity exercise/week Blood sugar logs with fasting goals of 80-120 mg/dl, random of less than 180 and in the event of sugars less than 60 mg/dl or greater than 400 mg/dl encouraged to notify the clinic. Advised on the need for annual eye exams, annual foot exams,  Pneumonia vaccine. - Ambulatory referral to Podiatry - Microalbumin/Creatinine Ratio, Urine - metFORMIN (GLUCOPHAGE-XR) 500 MG 24 hr tablet; Take 1 tablet (500 mg total) by mouth daily with breakfast.  Dispense: 90 tablet; Refill: 1  2. Screening for colon cancer - Ambulatory referral to Gastroenterology  3. Need for hepatitis C screening test - HCV Ab w Reflex to Quant PCR  4. Anxiety and depression Controlled -he reports feeling better Currently in inpatient behavioral health setting He will be discharged next week - gabapentin (NEURONTIN) 300 MG capsule; Take 1 capsule (300 mg total) by mouth 3 (three) times daily.  Dispense: 90 capsule; Refill: 3 - sertraline (ZOLOFT) 100 MG tablet; Take 1 tablet (100 mg total) by mouth daily.  Dispense: 90 tablet; Refill: 1  5. Hyperlipidemia associated  with type 2 diabetes mellitus (HCC) Controlled Low-cholesterol diet - atorvastatin (LIPITOR) 80 MG tablet; Take 1 tablet (80 mg total) by mouth daily.  Dispense: 90 tablet; Refill: 1 - ezetimibe (ZETIA) 10 MG tablet; Take 1 tablet (10 mg total) by mouth daily.  Dispense: 90 tablet; Refill: 1  6. Hypertension associated with diabetes ([place]) Slightly above goal No regimen change today but will reassess at next visit Counseled on blood pressure goal of less than 130/80, low-sodium, DASH diet, medication compliance, 150 minutes of moderate intensity exercise per week. Discussed medication compliance, adverse effects.   7. Hypertensive heart disease with acute on chronic combined systolic and diastolic congestive heart failure (HCC) EF of 30 to 35%, euvolemic Currently on Entresto, BiDil, beta-blocker, unable to tolerate SGLT2i - spironolactone (ALDACTONE) 25 MG tablet; Take 1 tablet (25 mg total) by mouth daily.  Dispense: 90 tablet; Refill: 1  8. Need for immunization against influenza - Flu Vaccine QUAD 1moIM (Fluarix, Fluzone & Alfiuria Quad PF)   Meds ordered this encounter  Medications   gabapentin (NEURONTIN) 300 MG capsule    Sig: Take 1 capsule (300 mg total) by mouth 3 (three) times daily.    Dispense:  90 capsule    Refill:  3    Dose change   atorvastatin (LIPITOR) 80 MG tablet    Sig: Take 1 tablet (80 mg total) by mouth daily.    Dispense:  90 tablet    Refill:  1   ezetimibe (ZETIA) 10 MG tablet    Sig: Take 1 tablet (10 mg total) by mouth daily.    Dispense:  90 tablet    Refill:  1   metFORMIN (GLUCOPHAGE-XR) 500 MG 24 hr tablet    Sig: Take 1 tablet (500 mg total) by mouth daily with breakfast.    Dispense:  90 tablet    Refill:  1   sertraline (ZOLOFT) 100 MG tablet    Sig: Take 1 tablet (100 mg total) by mouth daily.    Dispense:  90 tablet    Refill:  1   spironolactone (ALDACTONE) 25 MG tablet    Sig: Take 1 tablet (25 mg total) by mouth daily.     Dispense:  90 tablet    Refill:  1    Follow-up: Return in about 3 months (around 11/13/2022) for Chronic medical conditions.       ECharlott Rakes MD, FAAFP. [facility]  08/13/2022, 6:02 PM

## 2022-08-13 NOTE — Patient Instructions (Signed)

## 2022-08-14 ENCOUNTER — Telehealth: Payer: Self-pay | Admitting: *Deleted

## 2022-08-14 DIAGNOSIS — F419 Anxiety disorder, unspecified: Secondary | ICD-10-CM

## 2022-08-14 LAB — MICROALBUMIN / CREATININE URINE RATIO
Creatinine, Urine: 88.3 mg/dL
Microalb/Creat Ratio: 16 mg/g creat (ref 0–29)
Microalbumin, Urine: 14.1 ug/mL

## 2022-08-14 LAB — HCV INTERPRETATION

## 2022-08-14 LAB — HCV AB W REFLEX TO QUANT PCR: HCV Ab: NONREACTIVE

## 2022-08-14 NOTE — Telephone Encounter (Signed)
Patient was given new Rx for Gabapentin with increased dosing. Marzetta Board is calling to make sure that is correct- they thought the dose was decreasing.  Call [place] [phone] ext [phone]

## 2022-08-15 MED ORDER — GABAPENTIN 300 MG PO CAPS: 300.0000 mg | ORAL_CAPSULE | Freq: Every day | ORAL | 3 refills | Status: DC

## 2022-08-15 NOTE — Addendum Note (Signed)
Addended by: Charlott Rakes on: 08/15/2022 05:18 PM   Modules accepted: Orders

## 2022-08-15 NOTE — Telephone Encounter (Signed)
It should be 300 mg nightly.  He complained of somnolence with 100 mg 3 times daily.  I have corrected the prescription.  Thank you

## 2022-08-16 NOTE — Telephone Encounter (Signed)
Matthew [name] given updated Gabapentin order, verbalizes understanding.

## 2022-09-02 ENCOUNTER — Emergency Department (HOSPITAL_BASED_OUTPATIENT_CLINIC_OR_DEPARTMENT_OTHER): Payer: Medicaid Other

## 2022-09-02 ENCOUNTER — Encounter (HOSPITAL_BASED_OUTPATIENT_CLINIC_OR_DEPARTMENT_OTHER): Payer: Self-pay | Admitting: Emergency Medicine

## 2022-09-02 ENCOUNTER — Emergency Department (HOSPITAL_BASED_OUTPATIENT_CLINIC_OR_DEPARTMENT_OTHER)
Admission: EM | Admit: 2022-09-02 | Discharge: 2022-09-02 | Disposition: A | Discharge status: Home / Self Care | Payer: Medicaid Other | Attending: Emergency Medicine | Admitting: Emergency Medicine

## 2022-09-02 ENCOUNTER — Other Ambulatory Visit: Payer: Self-pay

## 2022-09-02 DIAGNOSIS — Z7984 Long term (current) use of oral hypoglycemic drugs: Secondary | ICD-10-CM | POA: Diagnosis not present

## 2022-09-02 DIAGNOSIS — I130 Hypertensive heart and chronic kidney disease with heart failure and stage 1 through stage 4 chronic kidney disease, or unspecified chronic kidney disease: Secondary | ICD-10-CM | POA: Insufficient documentation

## 2022-09-02 DIAGNOSIS — Z79899 Other long term (current) drug therapy: Secondary | ICD-10-CM | POA: Diagnosis not present

## 2022-09-02 DIAGNOSIS — U071 COVID-19: Secondary | ICD-10-CM | POA: Insufficient documentation

## 2022-09-02 DIAGNOSIS — Z7982 Long term (current) use of aspirin: Secondary | ICD-10-CM | POA: Diagnosis not present

## 2022-09-02 DIAGNOSIS — N189 Chronic kidney disease, unspecified: Secondary | ICD-10-CM | POA: Insufficient documentation

## 2022-09-02 DIAGNOSIS — I509 Heart failure, unspecified: Secondary | ICD-10-CM | POA: Diagnosis not present

## 2022-09-02 DIAGNOSIS — R0602 Shortness of breath: Secondary | ICD-10-CM | POA: Diagnosis present

## 2022-09-02 DIAGNOSIS — E1122 Type 2 diabetes mellitus with diabetic chronic kidney disease: Secondary | ICD-10-CM | POA: Insufficient documentation

## 2022-09-02 LAB — SARS CORONAVIRUS 2 BY RT PCR: SARS Coronavirus 2 by RT PCR: POSITIVE — AB

## 2022-09-02 MED ORDER — MOLNUPIRAVIR EUA 200MG CAPSULE: 4.0000 | ORAL_CAPSULE | Freq: Two times a day (BID) | ORAL | 0 refills | Status: AC

## 2022-09-02 MED ORDER — ACETAMINOPHEN 500 MG PO TABS
1000.0000 mg | ORAL_TABLET | Freq: Once | ORAL | Status: AC
  Administered 2022-09-02: 1000 mg via ORAL
  Filled 2022-09-02: qty 2

## 2022-09-02 MED ORDER — IPRATROPIUM-ALBUTEROL 0.5-2.5 (3) MG/3ML IN SOLN: 3.0000 mL | RESPIRATORY_TRACT | Status: DC | PRN

## 2022-09-02 MED ORDER — ALBUTEROL SULFATE HFA 108 (90 BASE) MCG/ACT IN AERS: 2.0000 | INHALATION_SPRAY | RESPIRATORY_TRACT | Status: DC | PRN

## 2022-09-02 MED ORDER — MOLNUPIRAVIR EUA 200MG CAPSULE: 4.0000 | ORAL_CAPSULE | Freq: Two times a day (BID) | ORAL | 0 refills | Status: DC

## 2022-09-02 NOTE — Discharge Instructions (Signed)
You have tested positive for COVID-19 virus.  Please continue to quarantine at home and monitor your symptoms closely. You chest x-ray was clear. Antibiotics are not helpful in treating viral infection, the virus should run its course in about 5-7 days. I have sent in a prescription for an antiviral. Please make sure you are drinking plenty of fluids. You can treat your symptoms supportively with tylenol for fevers and pains, and over the counter cough syrups and throat lozenges to help with cough. If your symptoms are not improving please follow up with you Primary doctor.   I recommend that you purchase a home pulse ox to help better monitor your oxygen at home, if you start to have increased work of breathing or shortness of breath or your oxygen drops below 90% please immediately return to the hospital for reevaluation.  If you develop persistent fevers, shortness of breath or difficulty breathing, chest pain, severe headache and neck pain, persistent nausea and vomiting or other new or concerning symptoms return to the Emergency department.

## 2022-09-02 NOTE — ED Provider Notes (Signed)
[place] EMERGENCY DEPARTMENT Provider Note   CSN: [id number] Arrival date & time: 09/02/22  1201     History  Chief Complaint  Patient presents with   Shortness of Breath    Matthew [name]. is a 56 y.o. male with history of CHF, type 2 diabetes complicated by CKD, hypertension who presents to the emergency department from [facility] for evaluation of fevers, productive cough and shortness of breath that started today.  Patient states that his roommate was recently diagnosed with COVID.  He states that he had 1 episode of vomiting today but currently denies nausea.  He also has global body aches.  He denies chest pain, abdominal pain, diarrhea, constipation, dysuria and hematuria.   Shortness of Breath Associated symptoms: cough, fever and vomiting   Associated symptoms: no abdominal pain and no headaches        Home Medications Prior to Admission medications   Medication Sig Start Date End Date Taking? Authorizing Provider  aspirin EC 81 MG tablet Take 1 tablet (81 mg total) by mouth daily. Swallow whole. 07/16/22   Mayers, Cari S, PA-C  atorvastatin (LIPITOR) 80 MG tablet Take 1 tablet (80 mg total) by mouth daily. 08/13/22   Charlott Rakes, MD  carvedilol (COREG) 6.25 MG tablet Take 1 tablet (6.25 mg total) by mouth 2 (two) times daily. 08/09/22   Larey Dresser, MD  ezetimibe (ZETIA) 10 MG tablet Take 1 tablet (10 mg total) by mouth daily. 08/13/22   Charlott Rakes, MD  furosemide (LASIX) 40 MG tablet Take 0.5 tablets (20 mg total) by mouth daily. 08/09/22   Larey Dresser, MD  gabapentin (NEURONTIN) 300 MG capsule Take 1 capsule (300 mg total) by mouth at bedtime. 08/15/22   Charlott Rakes, MD  hydrOXYzine (ATARAX) 25 MG tablet Take 1 tablet (25 mg total) by mouth 3 (three) times daily as needed for anxiety. 07/16/22   Mayers, Cari S, PA-C  isosorbide-hydrALAZINE (BIDIL) 20-37.5 MG tablet Take 1 tablet by mouth 3 (three) times daily. 07/16/22   Mayers, Cari S,  PA-C  melatonin 5 MG TABS Take 1 tablet (5 mg total) by mouth at bedtime. 07/16/22   Mayers, Cari S, PA-C  metFORMIN (GLUCOPHAGE-XR) 500 MG 24 hr tablet Take 1 tablet (500 mg total) by mouth daily with breakfast. 08/13/22   Charlott Rakes, MD  molnupiravir EUA (LAGEVRIO) 200 mg CAPS capsule Take 4 capsules (800 mg total) by mouth 2 (two) times daily for 5 days. 09/02/22 09/07/22  Tonye Pearson, PA-C  sacubitril-valsartan (ENTRESTO) 97-103 MG Take 1 tablet by mouth 2 (two) times daily. 08/09/22   Larey Dresser, MD  sertraline (ZOLOFT) 100 MG tablet Take 1 tablet (100 mg total) by mouth daily. 08/13/22   Charlott Rakes, MD  spironolactone (ALDACTONE) 25 MG tablet Take 1 tablet (25 mg total) by mouth daily. 08/13/22   Charlott Rakes, MD  timolol (TIMOPTIC) 0.5 % ophthalmic solution Place 1 drop into both eyes daily after breakfast. 07/16/22   Mayers, Cari S, PA-C  lovastatin (MEVACOR) 20 MG tablet Take 2 tablets (40 mg total) by mouth at bedtime. 06/24/14 08/26/14  Lorayne Marek, MD      Allergies    Patient has no known allergies.    Review of Systems   Review of Systems  Constitutional:  Positive for fever.  Respiratory:  Positive for cough and shortness of breath.   Gastrointestinal:  Positive for vomiting. Negative for abdominal pain, diarrhea and nausea.  Genitourinary:  Negative  for dysuria and hematuria.  Musculoskeletal:  Positive for myalgias.  Neurological:  Negative for headaches.    Physical Exam Updated Vital Signs BP 128/85   Pulse 92   Temp 98.2 F (36.8 C) (Oral)   Resp (!) 24   Ht '5\' 9"'$  (1.753 m)   Wt 111.4 kg   SpO2 95%   BMI 36.27 kg/m  Physical Exam Vitals and nursing note reviewed.  Constitutional:      General: He is not in acute distress.    Appearance: He is ill-appearing. He is not diaphoretic.  HENT:     Head: Atraumatic.     Mouth/Throat:     Pharynx: No oropharyngeal exudate.     Comments: Posterior oropharynx is erythematous Eyes:      Conjunctiva/sclera: Conjunctivae normal.  Cardiovascular:     Rate and Rhythm: Regular rhythm. Tachycardia present.     Pulses: Normal pulses.          Radial pulses are 2+ on the right side and 2+ on the left side.       Dorsalis pedis pulses are 2+ on the right side and 2+ on the left side.     Heart sounds: No murmur heard. Pulmonary:     Effort: Pulmonary effort is normal. Tachypnea present. No accessory muscle usage or respiratory distress.     Breath sounds: Normal breath sounds. No wheezing, rhonchi or rales.  Abdominal:     General: Abdomen is flat. There is no distension.     Palpations: Abdomen is soft.     Tenderness: There is no abdominal tenderness.  Musculoskeletal:        General: Normal range of motion.     Cervical back: Normal range of motion.     Right lower leg: No edema.     Left lower leg: No edema.  Skin:    General: Skin is warm and dry.     Capillary Refill: Capillary refill takes less than 2 seconds.  Neurological:     General: No focal deficit present.     Mental Status: He is alert.  Psychiatric:        Mood and Affect: Mood normal.     ED Results / Procedures / Treatments   Labs (all labs ordered are listed, but only abnormal results are displayed) Labs Reviewed  SARS CORONAVIRUS 2 BY RT PCR - Abnormal; Notable for the following components:      Result Value   SARS Coronavirus 2 by RT PCR POSITIVE (*)    All other components within normal limits    EKG EKG Interpretation  Date/Time:  Sunday September 02 2022 12:12:14 EDT Ventricular Rate:  118 PR Interval:  154 QRS Duration: 102 QT Interval:  324 QTC Calculation: 419 R Axis:   49 Text Interpretation: Sinus tachycardia Nonspecific ST and T wave abnormality Abnormal ECG When compared with ECG of 19-Jul-2022 11:33, PREVIOUS ECG IS PRESENT when compared to prior, faster rate. NO STEMI Confirmed by Antony Blackbird [phone]) on 09/02/2022 2:13:42 PM  Radiology DG Chest Portable 1 View  Result  Date: 09/02/2022 CLINICAL DATA:  Short of breath.  Vomiting today. EXAM: PORTABLE CHEST 1 VIEW COMPARISON:  07/28/2021 and older exams. FINDINGS: Cardiac silhouette is normal in size and configuration. Normal mediastinal and hilar contours. Clear lungs.  No pleural effusion or pneumothorax. Skeletal structures are grossly intact. IMPRESSION: No active disease. Electronically Signed   By: Lajean Manes M.D.   On: 09/02/2022 12:32    Procedures Procedures  Medications Ordered in ED Medications  ipratropium-albuterol (DUONEB) 0.5-2.5 (3) MG/3ML nebulizer solution 3 mL (has no administration in time range)  acetaminophen (TYLENOL) tablet 1,000 mg (1,000 mg Oral Given 09/02/22 1243)    ED Course/ Medical Decision Making/ A&P                           Medical Decision Making Amount and/or Complexity of Data Reviewed Radiology: ordered.  Risk OTC drugs. Prescription drug management.   Social determinants of health:  Social History   Socioeconomic History   Marital status: Widowed    Spouse name: Not on file   Number of children: 2   Years of education: 14   Highest education level: Not on file  Occupational History   Occupation: [employer]  Tobacco Use   Smoking status: Former    Years: 20.00    Types: Cigarettes   Smokeless tobacco: Former    Quit date: 11/14/2020  Vaping Use   Vaping Use: Never used  Substance and Sexual Activity   Alcohol use: No   Drug use: No   Sexual activity: Yes  Other Topics Concern   Not on file  Social History Narrative   Lives with 2 young children in [place].  Wife died in May 13, 2020.  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   Left-handed   Caffeine: occasional tea   Social Determinants of Health   Financial Resource Strain: High Risk (01/24/2021)   Overall Financial Resource Strain (CARDIA)    Difficulty of Paying Living Expenses: Very hard  Food Insecurity: Food Insecurity Present (01/24/2021)   Hunger Vital Sign     Worried About Running Out of Food in the Last Year: Sometimes true    Ran Out of Food in the Last Year: Sometimes true  Transportation Needs: Unmet Transportation Needs (01/24/2021)   PRAPARE - Hydrologist (Medical): Yes    Lack of Transportation (Non-Medical): Yes  Physical Activity: Not on file  Stress: Stress Concern Present (01/24/2021)   [place]    Feeling of Stress : Very much  Social Connections: Not on file  Intimate Partner Violence: Not on file     Initial impression:  This patient presents to the ED for concern of cough with shortness of breath, this involves an extensive number of treatment options, and is a complaint that carries with it a high risk of complications and morbidity.   Differentials include COVID, other viral infection, pneumonia, CHF exacerbation.   Comorbidities affecting care:  Refer to HPI  Additional history obtained: EMS  Lab Tests  I Ordered, reviewed, and interpreted labs and EKG.  The pertinent results include:  COVID test positive  Imaging Studies ordered:  I ordered imaging studies including  Chest x-ray without acute findings, no cardiomegaly I independently visualized and interpreted imaging and I agree with the radiologist interpretation.   EKG: Sinus tachycardia   Medicines ordered and prescription drug management:  I ordered medication including: Tylenol 1 g for fever Reevaluation of the patient after these medicines showed that the patient resolved I have reviewed the patients home medicines and have made adjustments as needed    ED Course/Re-evaluation: Patient is ill-appearing.  He presents on arrival he is febrile to 103.2 F, tachycardic to 117 and tachypneic.  He was given Tylenol 650 mg in triage with vitals normalizing.  During my  exam, he is resting comfortably in bed watching TV.  He is slightly tachypneic but he is  satting at 96% on room air.  Lungs CTA bilaterally without wheezes rales or rhonchi.  Abdomen is soft, nondistended and nontender.  COVID test ordered in triage is positive.  I ordered chest x-ray which was negative for pneumonia.  EKG without acute findings. Had shared decision-making conversation with patient and discussed discharge home with antivirals for positive COVID test which would explain all of his current symptoms.  However, given his history of CHF, I did offer additional work-up including labs and CTA.  Patient wishes to discharge home with antivirals.  Given that he is not having any chest pain or pleuritic chest pain, I think this is a reasonable option.  We discussed strict return precautions and when to return to the emergency department including development of chest pain, worsening shortness of breath or swelling of the lower extremities.  Patient expresses understanding and is amenable to plan. Discussed this case with my attending who agrees with plan and management.  Disposition:  After consideration of the diagnostic results, physical exam, history and the patients response to treatment feel that the patent would benefit from discharge.   COVID-19: Plan and management as described above. Discharged home in good condition. Final Clinical Impression(s) / ED Diagnoses Final diagnoses:  [id number]    Rx / DC Orders ED Discharge Orders          Ordered    molnupiravir EUA (LAGEVRIO) 200 mg CAPS capsule  2 times daily,   Status:  Discontinued        09/02/22 1453    molnupiravir EUA (LAGEVRIO) 200 mg CAPS capsule  2 times daily        09/02/22 1454              Tonye Pearson, [place] 09/02/22 1528    Tegeler, Gwenyth Allegra, MD 09/04/22 2332

## 2022-09-02 NOTE — ED Notes (Signed)
Called [facility], [place], [place]  [phone] to arrange for transport of patient back to facility.

## 2022-09-02 NOTE — ED Notes (Signed)
Shortness of breath since this am.Denies any chest pain tinging in both hand .

## 2022-09-02 NOTE — ED Triage Notes (Signed)
Patient c/o shortness of breath and vomiting onset today. Patient from [facility].

## 2022-09-19 ENCOUNTER — Other Ambulatory Visit: Payer: Self-pay | Admitting: Physician Assistant

## 2022-09-19 DIAGNOSIS — F411 Generalized anxiety disorder: Secondary | ICD-10-CM

## 2022-09-19 MED ORDER — HYDROXYZINE HCL 25 MG PO TABS: 25.0000 mg | ORAL_TABLET | Freq: Three times a day (TID) | ORAL | 1 refills | Status: DC | PRN

## 2022-09-19 NOTE — Progress Notes (Signed)
Courtesy refill  Kennieth Rad, PA-C Physician Assistant [facility] Medicine [email or web address]

## 2022-10-30 ENCOUNTER — Other Ambulatory Visit (HOSPITAL_COMMUNITY): Payer: Medicaid Other

## 2022-10-30 ENCOUNTER — Encounter (HOSPITAL_COMMUNITY): Payer: Medicaid Other | Admitting: Cardiology

## 2022-10-31 ENCOUNTER — Encounter (HOSPITAL_COMMUNITY): Payer: Medicaid Other | Admitting: Cardiology

## 2022-10-31 ENCOUNTER — Ambulatory Visit (HOSPITAL_COMMUNITY): Admission: RE | Admit: 2022-10-31 | Payer: Medicaid Other | Source: Ambulatory Visit

## 2022-11-19 ENCOUNTER — Ambulatory Visit: Payer: Medicaid Other | Admitting: Family Medicine

## 2022-12-06 ENCOUNTER — Other Ambulatory Visit: Payer: Self-pay

## 2023-04-05 ENCOUNTER — Other Ambulatory Visit: Payer: Self-pay | Admitting: Family Medicine

## 2023-04-05 ENCOUNTER — Other Ambulatory Visit (HOSPITAL_COMMUNITY): Payer: Self-pay | Admitting: Cardiology

## 2023-04-05 ENCOUNTER — Other Ambulatory Visit: Payer: Self-pay

## 2023-04-05 DIAGNOSIS — I509 Heart failure, unspecified: Secondary | ICD-10-CM

## 2023-04-05 DIAGNOSIS — H538 Other visual disturbances: Secondary | ICD-10-CM

## 2023-04-05 MED ORDER — FUROSEMIDE 40 MG PO TABS
20.0000 mg | ORAL_TABLET | Freq: Every day | ORAL | 0 refills | Status: DC
  Filled 2023-04-05: qty 45, 90d supply, fill #0

## 2023-04-05 MED ORDER — GABAPENTIN 300 MG PO CAPS: 300.0000 mg | ORAL_CAPSULE | Freq: Every day | ORAL | 4 refills | Status: DC

## 2023-04-05 NOTE — Telephone Encounter (Signed)
Requested medication (s) are due for refill today: yes  Requested medication (s) are on the active medication list: yes  Last refill:  07/16/22 #5 ml 1 refill  Future visit scheduled: no  Notes to clinic:  requested from [facility] medical pharmacy . Last ordered by Maurene Capes, PA 07/16/22. Do you want to refill Rx?     Requested Prescriptions  Pending Prescriptions Disp Refills   timolol (TIMOPTIC) 0.5 % ophthalmic solution 5 mL 1    Sig: Place 1 drop into both eyes daily after breakfast.     Ophthalmology:  Glaucoma 2 Passed - 04/05/2023  3:47 PM      Passed - Last BP in normal range    BP Readings from Last 1 Encounters:  09/02/22 128/85         Passed - Last Heart Rate in normal range    Pulse Readings from Last 1 Encounters:  09/02/22 92         Passed - Valid encounter within last 12 months    Recent Outpatient Visits           7 months ago Controlled type 2 diabetes mellitus with diabetic nephropathy, without long-term current use of insulin (HCC)   [place] [facility], [place], MD   1 year ago Controlled type 2 diabetes mellitus with diabetic nephropathy, without long-term current use of insulin (HCC)   [place] [facility] [name], MD   1 year ago Blurred vision, right eye   [place] [facility] Jonah Blue B, MD   2 years ago Hypertensive heart disease with acute on chronic combined systolic and diastolic congestive heart failure [facility])   [place] [facility] Stanton, Odette Horns, MD   2 years ago Chronic combined systolic and diastolic congestive heart failure [facility])   [place] [facility] Marcine Matar, MD

## 2023-04-08 ENCOUNTER — Other Ambulatory Visit: Payer: Self-pay

## 2023-05-24 ENCOUNTER — Other Ambulatory Visit: Payer: Self-pay

## 2023-05-28 ENCOUNTER — Other Ambulatory Visit: Payer: Self-pay

## 2023-06-18 ENCOUNTER — Other Ambulatory Visit (HOSPITAL_COMMUNITY): Payer: Self-pay

## 2023-06-27 NOTE — Progress Notes (Signed)
Advanced Heart Failure Clinic Note   PCP: Matthew Register, MD HF Cardiologist: Dr. Shirlee Latch  Reason for Visit: F/u for chronic systolic heart failure   HPI: Matthew Petersonis a 57 y.o. with PMH significant for combined systolic CHF with biventricular failure, T2DM, HTN, OSA, tobacco use, HLD, cocaine use, poor medication adherence, depression,  CVA, and CKD 3a.     He was first admitted with HF in 2015 in the setting of long standing uncontrolled hypertension and smoking.  Had a LHC with normal cors.    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  Was not followed by cardiology or hospitalized for CHF during this time period.     March 2021 he began having CHF exacerbations after running out of meds between admissions.  Left AMA in March. Another similar admission in July, he was out of his medications at that time as well.     Admitted 12/2020 with CHF exacerbation after running out of his medications.  He was diuresed, put on an excellent medication regimen. He also endorsed suicidal ideation at that time and was subsequently admitted to behavioral health. UDS was positive for cocaine, echo showed EF < 20% with moderate RV dysfunction.   He was seen in the HF clinic 02/08/21 and lasix was cut back to 20 mg daily, later Lasix was stopped.   Echo 5/22 with EF improved to 50%.   Follow up 6/22, [name] was stopped due to dizziness.  Follow up 9/22 and was fluid overloaded and SOB. Had ran out of all of his meds, including lasix ~3 months prior. ReDs 46%. Given Lasix 80 mg IV x 1 in the office, GDMT adjusted. Instructed to f/u in 1 week but no showed to the appt.   Follow up 11/22 he had been off several medications, more SOB, continued to use cocaine. Mediations restarted, pillbox filled. Seen back 12/22, stable NYHA III, volume OK ReDs 35%. [name] restarted and advised 4 week follow up. No show x 2 appts.   Echo in 1/23 with EF 30-35%,  mild LVH, normal RV.   Admitted 3/23 with SI and auditory hallucinations.   Today he returns for AHF follow up. Overall feeling ok. Denies palpitations, CP, edema, or PND/Orthopnea. SOB "all the time" d/t humidity and heat, none while sitting in the office today and none while indoors in general. Appetite great, does not watch what he eats. No fever or chills. Does not weight daily. Dizziness only when he stands too fast. Does not take his medications as he should, says they make him too sleepy and wipe him out for the day. "Says he's fine and clearly he doesn't need to be taking all these pills." Denies ETOH, smoking, cocaine. Now living in an apartment. 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 Has been taking his lasix daily.   ECG (personally reviewed): NSR 60s  Labs (5/22): K 3.9, creatinine 1.6 Labs (6/22): K 3.8, creatinine 1.49 Labs (8/22): K 4.6 creatinine 1.25 Labs (9/22): K 4.1, creatinine 1.29, LDL 40  Labs (11/22): K 3.7 creatinine 1.91 Labs (3/23): K 4.2, creatinine 1.2, LDL 101, HDL 54 Labs (5/23): K 3.6, creatinine 1.23 Labs (8/23): K 4.4, SCr 1.4  PMH: 1. HTN 2. Type 2 diabetes 3. H/o cocaine abuse: Positive UDS 1/22.  4. CVA 2018 5. OSA: Uses CPAP.  6. Chronic systolic CHF: Nonischemic cardiomyopathy.  - LHC (2015): Normal coronaries.  - Echo (2021):  EF 40-45%. - Echo (1/22): EF <20%, moderate LV dilation, moderately decreased RV systolic function, severe LAE.  - Echo (5/22): EF 50%, moderate LV dilation, normal RV.  - Echo (1/23): EF 30-35%, moderately decreased LV, normal RV, ascending aorta 39 mm - Echo today: read pending 7. Depression 8. CKD stage 3 9. Hyperlipidemia  Current Outpatient Medications  Medication Sig Dispense Refill   aspirin EC 81 MG tablet Take 1 tablet (81 mg total) by mouth daily. Swallow whole. 30 tablet 1   atorvastatin (LIPITOR) 80 MG tablet Take 1 tablet (80 mg total) by mouth daily. 90 tablet 1   carvedilol (COREG) 6.25  MG tablet Take 1 tablet (6.25 mg total) by mouth 2 (two) times daily. 180 tablet 3   ezetimibe (ZETIA) 10 MG tablet Take 1 tablet (10 mg total) by mouth daily. 90 tablet 1   furosemide (LASIX) 40 MG tablet Take 0.5 tablets (20 mg total) by mouth daily. 45 tablet 0   gabapentin (NEURONTIN) 300 MG capsule Take 1 capsule (300 mg total) by mouth at bedtime. 30 capsule 3   hydrOXYzine (ATARAX) 25 MG tablet Take 1 tablet (25 mg total) by mouth 3 (three) times daily as needed for anxiety. 60 tablet 1   isosorbide-hydrALAZINE (BIDIL) 20-37.5 MG tablet Take 1 tablet by mouth 3 (three) times daily. 270 tablet 1   metFORMIN (GLUCOPHAGE-XR) 500 MG 24 hr tablet Take 1 tablet (500 mg total) by mouth daily with breakfast. 90 tablet 1   sertraline (ZOLOFT) 100 MG tablet Take 1 tablet (100 mg total) by mouth daily. 90 tablet 1   spironolactone (ALDACTONE) 25 MG tablet Take 1 tablet (25 mg total) by mouth daily. 90 tablet 1   timolol (TIMOPTIC) 0.5 % ophthalmic solution Place 1 drop into both eyes daily after breakfast. 5 mL 1   melatonin 5 MG TABS Take 1 tablet (5 mg total) by mouth at bedtime. (Patient not taking: Reported on 07/04/2023) 30 tablet 1   sacubitril-valsartan (ENTRESTO) 97-103 MG Take 1 tablet by mouth 2 (two) times daily. (Patient not taking: Reported on 07/04/2023) 180 tablet 3   No current facility-administered medications for this encounter.   No Known Allergies  Social History   Socioeconomic History   Marital status: Widowed    Spouse name: Not on file   Number of children: 2   Years of education: 14   Highest education level: Not on file  Occupational History   Occupation: [employer]  Tobacco Use   Smoking status: Former    Types: Cigarettes   Smokeless tobacco: Former    Quit date: 11/14/2020  Vaping Use   Vaping status: Never Used  Substance and Sexual Activity   Alcohol use: No   Drug use: No   Sexual activity: Yes  Other Topics Concern   Not on file  Social History  Narrative   Lives with 2 young children in [place].  Wife died in June 08, 2020.  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   Left-handed   Caffeine: occasional tea   Social Determinants of Health   Financial Resource Strain: High Risk (01/24/2021)   Overall Financial Resource Strain (CARDIA)    Difficulty of Paying Living Expenses: Very hard  Food Insecurity: Food Insecurity Present (01/24/2021)   Hunger Vital Sign    Worried About Running Out of Food in the Last Year: Sometimes true    Ran Out of Food in the Last Year: Sometimes true  Transportation Needs:  Unmet Transportation Needs (01/24/2021)   PRAPARE - Administrator, Civil Service (Medical): Yes    Lack of Transportation (Non-Medical): Yes  Physical Activity: Not on file  Stress: Stress Concern Present (01/24/2021)   [employer] of Occupational Health - Occupational Stress Questionnaire    Feeling of Stress : Very much  Social Connections: Unknown (05/01/2022)   Received from [facility]   Social Network    Social Network: Not on file  Intimate Partner Violence: Unknown (03/28/2022)   Received from [facility]   HITS    Physically Hurt: Not on file    Insult or Talk Down To: Not on file    Threaten Physical Harm: Not on file    Scream or Curse: Not on file   Family History  Problem Relation Age of Onset   Heart attack Father        died @ 94   Cervical cancer Mother        died @ 68   BP (!) 147/99   Pulse 68   Wt 87.4 kg (192 lb 9.6 oz)   SpO2 99%   BMI 28.44 kg/m   Wt Readings from Last 3 Encounters:  07/04/23 87.4 kg (192 lb 9.6 oz)  09/02/22 111.4 kg (245 lb 9.6 oz)  08/13/22 111.4 kg (245 lb 9.6 oz)   PHYSICAL EXAM: General:  well appearing.  No respiratory difficulty HEENT: normal Neck: supple. JVD ~6 cm. Carotids 2+ bilat; no bruits. No lymphadenopathy or thyromegaly appreciated. Cor: PMI nondisplaced. Regular rate & rhythm. No rubs, gallops or murmurs. Lungs:  clear Abdomen: soft, nontender, nondistended. No hepatosplenomegaly. No bruits or masses. Good bowel sounds. Extremities: no cyanosis, clubbing, rash, edema  Neuro: alert & oriented x 3, cranial nerves grossly intact. moves all 4 extremities w/o difficulty. Affect pleasant.   ASSESSMENT & PLAN: 1. 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  Due to poorly controlled HTN +/- cocaine abuse.  Normal coronaries on 2015 cath.  Echo in 1/22 showed EF < 20% with moderate RV dysfunction (off meds, + cocaine).  Echo in 5/22 with EF up to 50% (on meds and off cocaine). Echo 1/23 (off meds and on cocaine) EF down to 30-35%. He is now living at [facility] and is off drugs and ETOH.  NYHA class I, not volume overloaded on exam.  - Continue lasix 20 mg daily - Has not been on Entresto 2/2 headache? Per patient report. Will not restart today with elevated renal function - Continue spironolactone 25 mg daily.  - Continue Coreg 6.25 mg bid.  - Continue BiDil 1 tab tid. - Did not tolerate Farxiga due to dizziness  - EF previously out of ICD range, most recent echo EF back down => hopefully improved off cocaine and ETOH.  - Echo today, official read pending  - Discussed importance of med compliance. BMET, BNP today 2. HTN: BP mildly elevated, GDMT as above. Has not had meds this morning.   3. OSA: Not compliant with CPAP, needs new one. Will arrange sleep study for new machine.  4. Tobacco Use Disorder: He has quit smoking.  5. Cocaine Use Disorder:  He has quit using cocaine.   6. CKD 3: Check BMET today  7. Hyperlipidemia: Continue atorvastatin.  - LDL 16 8/23, update lipid panel 8. Depression: Controlled.  9. T2DM: management per PCP. - Did not tolerate Farxiga due to dizziness. 10. 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  HFSW to see today, needs assistance with handicap sticker.  Follow up in 3 months with Dr. Thressa Sheller, NP 07/04/23

## 2023-07-03 ENCOUNTER — Telehealth (HOSPITAL_COMMUNITY): Payer: Self-pay

## 2023-07-03 NOTE — Telephone Encounter (Signed)
Called to confirm/remind patient of their appointment at the Advanced Heart Failure Clinic on 07/04/23.   Patient reminded to bring all medications and/or complete list.  Confirmed patient has transportation. Gave directions, instructed to utilize valet parking.  Confirmed appointment prior to ending call.

## 2023-07-04 ENCOUNTER — Encounter (HOSPITAL_COMMUNITY): Payer: Self-pay

## 2023-07-04 ENCOUNTER — Ambulatory Visit (HOSPITAL_BASED_OUTPATIENT_CLINIC_OR_DEPARTMENT_OTHER)
Admission: RE | Admit: 2023-07-04 | Discharge: 2023-07-04 | Disposition: A | Discharge status: Home / Self Care | Payer: MEDICAID | Source: Ambulatory Visit | Attending: Internal Medicine | Admitting: Internal Medicine

## 2023-07-04 ENCOUNTER — Ambulatory Visit (HOSPITAL_COMMUNITY)
Admission: RE | Admit: 2023-07-04 | Discharge: 2023-07-04 | Disposition: A | Discharge status: Home / Self Care | Payer: MEDICAID | Source: Ambulatory Visit | Attending: Internal Medicine | Admitting: Internal Medicine

## 2023-07-04 VITALS — BP 147/99 | HR 68 | Wt 192.6 lb

## 2023-07-04 DIAGNOSIS — I10 Essential (primary) hypertension: Secondary | ICD-10-CM | POA: Diagnosis not present

## 2023-07-04 DIAGNOSIS — N1831 Chronic kidney disease, stage 3a: Secondary | ICD-10-CM | POA: Diagnosis present

## 2023-07-04 DIAGNOSIS — Z79899 Other long term (current) drug therapy: Secondary | ICD-10-CM | POA: Diagnosis not present

## 2023-07-04 DIAGNOSIS — G4733 Obstructive sleep apnea (adult) (pediatric): Secondary | ICD-10-CM | POA: Insufficient documentation

## 2023-07-04 DIAGNOSIS — Z87891 Personal history of nicotine dependence: Secondary | ICD-10-CM | POA: Diagnosis not present

## 2023-07-04 DIAGNOSIS — R42 Dizziness and giddiness: Secondary | ICD-10-CM | POA: Insufficient documentation

## 2023-07-04 DIAGNOSIS — Z8673 Personal history of transient ischemic attack (TIA), and cerebral infarction without residual deficits: Secondary | ICD-10-CM

## 2023-07-04 DIAGNOSIS — E782 Mixed hyperlipidemia: Secondary | ICD-10-CM | POA: Diagnosis not present

## 2023-07-04 DIAGNOSIS — I5032 Chronic diastolic (congestive) heart failure: Secondary | ICD-10-CM | POA: Diagnosis not present

## 2023-07-04 DIAGNOSIS — I3139 Other pericardial effusion (noninflammatory): Secondary | ICD-10-CM | POA: Diagnosis not present

## 2023-07-04 DIAGNOSIS — I130 Hypertensive heart and chronic kidney disease with heart failure and stage 1 through stage 4 chronic kidney disease, or unspecified chronic kidney disease: Secondary | ICD-10-CM | POA: Insufficient documentation

## 2023-07-04 DIAGNOSIS — F1410 Cocaine abuse, uncomplicated: Secondary | ICD-10-CM

## 2023-07-04 DIAGNOSIS — I5082 Biventricular heart failure: Secondary | ICD-10-CM | POA: Insufficient documentation

## 2023-07-04 DIAGNOSIS — I428 Other cardiomyopathies: Secondary | ICD-10-CM | POA: Diagnosis not present

## 2023-07-04 DIAGNOSIS — E785 Hyperlipidemia, unspecified: Secondary | ICD-10-CM | POA: Insufficient documentation

## 2023-07-04 DIAGNOSIS — F32A Depression, unspecified: Secondary | ICD-10-CM | POA: Diagnosis not present

## 2023-07-04 DIAGNOSIS — E1121 Type 2 diabetes mellitus with diabetic nephropathy: Secondary | ICD-10-CM

## 2023-07-04 DIAGNOSIS — E1122 Type 2 diabetes mellitus with diabetic chronic kidney disease: Secondary | ICD-10-CM | POA: Diagnosis not present

## 2023-07-04 DIAGNOSIS — I5040 Unspecified combined systolic (congestive) and diastolic (congestive) heart failure: Secondary | ICD-10-CM | POA: Diagnosis not present

## 2023-07-04 DIAGNOSIS — I5042 Chronic combined systolic (congestive) and diastolic (congestive) heart failure: Secondary | ICD-10-CM | POA: Diagnosis not present

## 2023-07-04 DIAGNOSIS — Z720 Tobacco use: Secondary | ICD-10-CM

## 2023-07-04 DIAGNOSIS — F0631 Mood disorder due to known physiological condition with depressive features: Secondary | ICD-10-CM

## 2023-07-04 LAB — LIPID PANEL
Cholesterol: 167 mg/dL (ref 0–200)
HDL: 41 mg/dL (ref >40)
LDL Cholesterol: 103 mg/dL — ABNORMAL HIGH (ref 0–99)
Total CHOL/HDL Ratio: 4.1 RATIO
Triglycerides: 116 mg/dL (ref <150)
VLDL: 23 mg/dL (ref 0–40)

## 2023-07-04 LAB — ECHOCARDIOGRAM COMPLETE
AV Mean grad: 4 mmHg
AV Peak grad: 7.4 mmHg
Ao pk vel: 1.36 m/s
Area-P 1/2: 3.19 cm2
S' Lateral: 6 cm

## 2023-07-04 LAB — BASIC METABOLIC PANEL
Anion gap: 7 (ref 5–15)
BUN: 15 mg/dL (ref 6–20)
CO2: 23 mmol/L (ref 22–32)
Calcium: 8.8 mg/dL — ABNORMAL LOW (ref 8.9–10.3)
Chloride: 104 mmol/L (ref 98–111)
Creatinine, Ser: 1.46 mg/dL — ABNORMAL HIGH (ref 0.61–1.24)
GFR, Estimated: 56 mL/min — ABNORMAL LOW (ref >60)
Glucose, Bld: 119 mg/dL — ABNORMAL HIGH (ref 70–99)
Potassium: 3.5 mmol/L (ref 3.5–5.1)
Sodium: 134 mmol/L — ABNORMAL LOW (ref 135–145)

## 2023-07-04 LAB — BRAIN NATRIURETIC PEPTIDE: B Natriuretic Peptide: 47 pg/mL (ref 0.0–100.0)

## 2023-07-04 NOTE — Progress Notes (Signed)
H&V Care Navigation CSW Progress Note  Clinical Social Worker consulted to help pt get disability placard for his car.  CSW printed application and assisted physician in completing- provided to patient to fill out his portion and turn in to get placard.  Pt reports he is doing ok- living in an apartment now.  No social concerns expressed at this time.   SDOH Screenings   Food Insecurity: Food Insecurity Present (01/24/2021)  Housing: High Risk (01/24/2021)  Transportation Needs: Unmet Transportation Needs (01/24/2021)  Alcohol Screen: Low Risk  (02/15/2022)  Depression (PHQ2-9): High Risk (08/13/2022)  Financial Resource Strain: High Risk (01/24/2021)  Social Connections: Unknown (05/01/2022)   Received from [facility]  Stress: Stress Concern Present (01/24/2021)  Tobacco Use: Medium Risk (07/04/2023)   Burna Sis, LCSW Clinical Social Worker [facility] Desk#: [phone] Cell#: [phone]

## 2023-07-04 NOTE — Patient Instructions (Addendum)
Medication Changes:  We recommend that you continue on your current medications as directed. Please refer to the Current Medication list given to you today.   *If you need a refill on your cardiac medications before your next appointment, please call your pharmacy*  Lab Work:  Labs done today, your results will be available in MyChart, we will contact you for abnormal readings.   You have been referred to get a sleep study done. They will call you schedule an appointment!   Follow-Up in:   Your physician recommends that you schedule a follow-up appointment in: 3 months with Dr. Shirlee Latch  ** please call at the end of August to schedule an appointment**   Do the following things EVERYDAY: Weigh yourself in the morning before breakfast. Write it down and keep it in a log. Take your medicines as prescribed Eat low salt foods--Limit salt (sodium) to 2000 mg per day.  Stay as active as you can everyday Limit all fluids for the day to less than 2 liters    Need to Contact us:  If you have any questions or concerns before your next appointment please send [place] a message through [place] or call our office at [phone].    TO LEAVE A MESSAGE FOR THE NURSE SELECT OPTION 2, PLEASE LEAVE A MESSAGE INCLUDING: YOUR NAME DATE OF BIRTH CALL BACK NUMBER REASON FOR CALL**this is important as we prioritize the call backs  YOU WILL RECEIVE A CALL BACK THE SAME DAY AS LONG AS YOU CALL BEFORE 4:00 PM   At the [facility], you and your health needs are our priority. As part of our continuing mission to provide you with exceptional heart care, we have created designated Provider Care Teams. These Care Teams include your primary Cardiologist (physician) and Advanced Practice Providers (APPs- Physician Assistants and Nurse Practitioners) who all work together to provide you with the care you need, when you need it.   You may see any of the following providers on your designated Care  Team at your next follow up: Dr Arvilla Meres Dr Marca Ancona Dr. Marcos Eke, NP Robbie Lis, [place] [facility] [place], [place] Brynda Peon, NP Karle Plumber, PharmD   Please be sure to bring in all your medications bottles to every appointment.    Thank you for choosing [facility]

## 2023-07-12 ENCOUNTER — Telehealth (HOSPITAL_COMMUNITY): Payer: Self-pay

## 2023-07-12 NOTE — Telephone Encounter (Signed)
-----   Message from Marca Ancona sent at 07/04/2023 10:42 PM EDT ----- EF remains low at 25-30%.  Will need followup to consider ICD placement.

## 2023-07-12 NOTE — Telephone Encounter (Signed)
No answer, Left message to return call. Letter mailed.

## 2023-08-05 ENCOUNTER — Other Ambulatory Visit: Payer: Self-pay

## 2023-08-05 ENCOUNTER — Encounter (HOSPITAL_COMMUNITY): Payer: Self-pay

## 2023-08-05 ENCOUNTER — Emergency Department (HOSPITAL_COMMUNITY)
Admission: EM | Admit: 2023-08-05 | Discharge: 2023-08-06 | Discharge status: Against Medical Advice | Payer: Medicaid Other | Attending: Emergency Medicine | Admitting: Emergency Medicine

## 2023-08-05 ENCOUNTER — Emergency Department (HOSPITAL_COMMUNITY): Payer: Medicaid Other

## 2023-08-05 DIAGNOSIS — R0602 Shortness of breath: Secondary | ICD-10-CM | POA: Diagnosis not present

## 2023-08-05 DIAGNOSIS — R0789 Other chest pain: Secondary | ICD-10-CM | POA: Insufficient documentation

## 2023-08-05 DIAGNOSIS — E119 Type 2 diabetes mellitus without complications: Secondary | ICD-10-CM | POA: Diagnosis not present

## 2023-08-05 DIAGNOSIS — R110 Nausea: Secondary | ICD-10-CM | POA: Insufficient documentation

## 2023-08-05 DIAGNOSIS — Z5321 Procedure and treatment not carried out due to patient leaving prior to being seen by health care provider: Secondary | ICD-10-CM | POA: Diagnosis not present

## 2023-08-05 DIAGNOSIS — M79602 Pain in left arm: Secondary | ICD-10-CM | POA: Insufficient documentation

## 2023-08-05 HISTORY — DX: Blindness, one eye, unspecified eye: H54.40

## 2023-08-05 LAB — CBC
HCT: 43.4 % (ref 39.0–52.0)
Hemoglobin: 14.7 g/dL (ref 13.0–17.0)
MCH: 29.1 pg (ref 26.0–34.0)
MCHC: 33.9 g/dL (ref 30.0–36.0)
MCV: 85.9 fL (ref 80.0–100.0)
Platelets: 250 10*3/uL (ref 150–400)
RBC: 5.05 MIL/uL (ref 4.22–5.81)
RDW: 12.6 % (ref 11.5–15.5)
WBC: 3.8 10*3/uL — ABNORMAL LOW (ref 4.0–10.5)
nRBC: 0 % (ref 0.0–0.2)

## 2023-08-05 LAB — BASIC METABOLIC PANEL
Anion gap: 10 (ref 5–15)
BUN: 9 mg/dL (ref 6–20)
CO2: 22 mmol/L (ref 22–32)
Calcium: 9 mg/dL (ref 8.9–10.3)
Chloride: 103 mmol/L (ref 98–111)
Creatinine, Ser: 1.46 mg/dL — ABNORMAL HIGH (ref 0.61–1.24)
GFR, Estimated: 56 mL/min — ABNORMAL LOW (ref >60)
Glucose, Bld: 129 mg/dL — ABNORMAL HIGH (ref 70–99)
Potassium: 3.8 mmol/L (ref 3.5–5.1)
Sodium: 135 mmol/L (ref 135–145)

## 2023-08-05 LAB — TROPONIN I (HIGH SENSITIVITY): Troponin I (High Sensitivity): 17 ng/L (ref <18)

## 2023-08-05 NOTE — ED Provider Triage Note (Signed)
Emergency Medicine Provider Triage Evaluation Note  Matthew [name]. , a 57 y.o. male  was evaluated in triage.  Pt complains of left-sided chest pain that started this morning.  Exact time unknown.  It started when he was at rest.  He also has associated shortness of breath, left arm pain, and nausea.  He notes a history of heart failure and diabetes.  Review of Systems  Positive: As above Negative: Above  Physical Exam  BP 109/89   Pulse 86   Temp (!) 97.5 F (36.4 C)   Resp 20   Ht 5\' 9"  (1.753 m)   Wt 87.4 kg   SpO2 96%   BMI 28.45 kg/m  Gen:   Awake, uncomfortable appearing, talking in short sentences Resp:  Normal effort  MSK:   Moves extremities without difficulty  Other:  Regular rate and rhythm on cardiac auscultation  Medical Decision Making  Medically screening exam initiated at 4:08 PM.  Appropriate orders placed.  [name] was informed that the remainder of the evaluation will be completed by another provider, this initial triage assessment does not replace that evaluation, and the importance of remaining in the ED until their evaluation is complete.     Arabella Merles, PA-C 08/05/23 [phone]

## 2023-08-05 NOTE — ED Triage Notes (Signed)
Pt c/o left sided chest pain that radiates to right arm, N/V, SOB started today.

## 2023-08-05 NOTE — ED Notes (Signed)
Pt name called for updated vitals, no response 

## 2023-11-04 ENCOUNTER — Encounter: Payer: Self-pay | Admitting: Family Medicine

## 2023-11-04 ENCOUNTER — Ambulatory Visit: Payer: Medicaid Other | Attending: Family Medicine | Admitting: Family Medicine

## 2023-11-04 ENCOUNTER — Other Ambulatory Visit: Payer: Self-pay

## 2023-11-04 VITALS — BP 149/85 | HR 81 | Ht 69.0 in | Wt 178.0 lb

## 2023-11-04 DIAGNOSIS — Z713 Dietary counseling and surveillance: Secondary | ICD-10-CM | POA: Insufficient documentation

## 2023-11-04 DIAGNOSIS — R5383 Other fatigue: Secondary | ICD-10-CM | POA: Diagnosis not present

## 2023-11-04 DIAGNOSIS — Z7182 Exercise counseling: Secondary | ICD-10-CM | POA: Insufficient documentation

## 2023-11-04 DIAGNOSIS — E1121 Type 2 diabetes mellitus with diabetic nephropathy: Secondary | ICD-10-CM

## 2023-11-04 DIAGNOSIS — Z87891 Personal history of nicotine dependence: Secondary | ICD-10-CM | POA: Diagnosis not present

## 2023-11-04 DIAGNOSIS — I5042 Chronic combined systolic (congestive) and diastolic (congestive) heart failure: Secondary | ICD-10-CM | POA: Insufficient documentation

## 2023-11-04 DIAGNOSIS — F419 Anxiety disorder, unspecified: Secondary | ICD-10-CM | POA: Diagnosis not present

## 2023-11-04 DIAGNOSIS — I110 Hypertensive heart disease with heart failure: Secondary | ICD-10-CM | POA: Diagnosis not present

## 2023-11-04 DIAGNOSIS — Z1211 Encounter for screening for malignant neoplasm of colon: Secondary | ICD-10-CM | POA: Diagnosis not present

## 2023-11-04 DIAGNOSIS — E1159 Type 2 diabetes mellitus with other circulatory complications: Secondary | ICD-10-CM | POA: Diagnosis not present

## 2023-11-04 DIAGNOSIS — H409 Unspecified glaucoma: Secondary | ICD-10-CM | POA: Diagnosis not present

## 2023-11-04 DIAGNOSIS — G4730 Sleep apnea, unspecified: Secondary | ICD-10-CM | POA: Diagnosis not present

## 2023-11-04 DIAGNOSIS — Z8673 Personal history of transient ischemic attack (TIA), and cerebral infarction without residual deficits: Secondary | ICD-10-CM | POA: Diagnosis not present

## 2023-11-04 DIAGNOSIS — Z79899 Other long term (current) drug therapy: Secondary | ICD-10-CM | POA: Insufficient documentation

## 2023-11-04 DIAGNOSIS — I5043 Acute on chronic combined systolic (congestive) and diastolic (congestive) heart failure: Secondary | ICD-10-CM

## 2023-11-04 DIAGNOSIS — E785 Hyperlipidemia, unspecified: Secondary | ICD-10-CM | POA: Diagnosis not present

## 2023-11-04 DIAGNOSIS — E1169 Type 2 diabetes mellitus with other specified complication: Secondary | ICD-10-CM

## 2023-11-04 DIAGNOSIS — F32A Depression, unspecified: Secondary | ICD-10-CM | POA: Diagnosis not present

## 2023-11-04 DIAGNOSIS — Z760 Encounter for issue of repeat prescription: Secondary | ICD-10-CM | POA: Insufficient documentation

## 2023-11-04 DIAGNOSIS — Z7984 Long term (current) use of oral hypoglycemic drugs: Secondary | ICD-10-CM

## 2023-11-04 DIAGNOSIS — R0602 Shortness of breath: Secondary | ICD-10-CM | POA: Insufficient documentation

## 2023-11-04 DIAGNOSIS — R634 Abnormal weight loss: Secondary | ICD-10-CM | POA: Diagnosis not present

## 2023-11-04 DIAGNOSIS — Z6826 Body mass index (BMI) 26.0-26.9, adult: Secondary | ICD-10-CM | POA: Diagnosis not present

## 2023-11-04 DIAGNOSIS — Z591 Inadequate housing, unspecified: Secondary | ICD-10-CM

## 2023-11-04 DIAGNOSIS — I152 Hypertension secondary to endocrine disorders: Secondary | ICD-10-CM | POA: Diagnosis not present

## 2023-11-04 DIAGNOSIS — F1990 Other psychoactive substance use, unspecified, uncomplicated: Secondary | ICD-10-CM

## 2023-11-04 LAB — POCT GLYCOSYLATED HEMOGLOBIN (HGB A1C): HbA1c, POC (controlled diabetic range): 6.1 % (ref 0.0–7.0)

## 2023-11-04 MED ORDER — METFORMIN HCL ER 500 MG PO TB24
500.0000 mg | ORAL_TABLET | Freq: Every day | ORAL | 1 refills | Status: DC
  Filled 2023-11-04: qty 90, 90d supply, fill #0

## 2023-11-04 MED ORDER — SERTRALINE HCL 100 MG PO TABS
100.0000 mg | ORAL_TABLET | Freq: Every day | ORAL | 1 refills | Status: DC
  Filled 2023-11-04: qty 90, 90d supply, fill #0

## 2023-11-04 MED ORDER — GABAPENTIN 300 MG PO CAPS
300.0000 mg | ORAL_CAPSULE | Freq: Every day | ORAL | 3 refills | Status: DC
  Filled 2023-11-04: qty 30, 30d supply, fill #0

## 2023-11-04 MED ORDER — ATORVASTATIN CALCIUM 80 MG PO TABS
80.0000 mg | ORAL_TABLET | Freq: Every day | ORAL | 1 refills | Status: DC
  Filled 2023-11-04: qty 90, 90d supply, fill #0

## 2023-11-04 MED ORDER — EZETIMIBE 10 MG PO TABS
10.0000 mg | ORAL_TABLET | Freq: Every day | ORAL | 1 refills | Status: DC
  Filled 2023-11-04: qty 90, 90d supply, fill #0

## 2023-11-04 NOTE — Progress Notes (Signed)
Subjective:  Patient ID: [name], male    DOB: [birth year]  Age: 57 y.o. MRN: [id number]  CC: Medical Management of Chronic Issues (Refill on all medication)   HPI Jarry Mihalek is a 57 y.o. 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   Interval History: Discussed the use of AI scribe software for clinical note transcription with the patient, who gave verbal consent to proceed.   He presents for medication refills. He reports significant weight loss and improved health since completing rehab for substance abuse last year. He has been managing his diabetes independently and reports an A1c of 6.2. He has been without his diabetes medication, metformin, for an extended period but has continued his blood pressure medication.  He also reports intermittent shortness of breath and fatigue, with some days feeling 'not so good' and others feeling 'kind of good.'  Cardiology appointment was in 06/2023 and last echocardiogram in July showed an EF of 25-30%.  He has been living in a new housing situation since rehab, which he describes as 'like a warehouse' and expresses a desire to move.  He also has a history of anxiety and depression, for which he takes Zoloft.        Past Medical History:  Diagnosis Date   Acute combined systolic and diastolic CHF, NYHA class 3 (HCC)    a. 02/2014 Echo: EF 25-30%.   Blindness of right eye    Cardiomyopathy (HCC)    a. 02/2014 Echo: EF 25-30%, sev glob HK with inferolat HK->AK, mod conc LVH, Gr 2 DD, Mild MR, sev dil LA.   Depression    DM (diabetes mellitus) (HCC)    High cholesterol    Hypertension    Morbid obesity (HCC)    Tobacco abuse     Past Surgical History:   Procedure Laterality Date   CATARACT EXTRACTION Right    LEFT HEART CATHETERIZATION WITH CORONARY ANGIOGRAM N/A 03/04/2014   Procedure: LEFT HEART CATHETERIZATION WITH CORONARY ANGIOGRAM;  Surgeon: Kathleene Hazel, MD;  Location: [facility] CATH LAB;  Service: Cardiovascular;  Laterality: N/A;   TEE WITHOUT CARDIOVERSION N/A 02/04/2017   Procedure: TRANSESOPHAGEAL ECHOCARDIOGRAM (TEE);  Surgeon: Lars Masson, MD;  Location: [facility] ENDOSCOPY;  Service: Cardiovascular;  Laterality: N/A;    Family History  Problem Relation Age of Onset   Heart attack Father        died @ 41   Cervical cancer Mother        died @ 30    Social History   Socioeconomic History   Marital status: Widowed    Spouse name: Not on file   Number of children: 2   Years of education: 14   Highest education level: Not on file  Occupational History   Occupation: [employer]  Tobacco Use   Smoking status: Former    Types: Cigarettes   Smokeless tobacco: Former    Quit date: 11/14/2020  Vaping Use   Vaping status: Never Used  Substance and Sexual Activity   Alcohol use: No   Drug use: No   Sexual activity: Yes  Other Topics Concern   Not on file  Social History Narrative   Lives with 2 young children in [place].  Wife died in May 2021.  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   Left-handed   Caffeine: occasional tea   Social Determinants of Health   Financial Resource Strain: High Risk (01/24/2021)   Overall Financial Resource Strain (CARDIA)    Difficulty of Paying Living Expenses: Very hard  Food Insecurity: Food Insecurity Present (01/24/2021)   Hunger Vital Sign    Worried About Running Out of Food in the Last Year: Sometimes true    Ran Out of Food in the Last Year: Sometimes true  Transportation Needs: Unmet Transportation Needs (01/24/2021)   PRAPARE - Administrator, Civil Service (Medical): Yes    Lack of Transportation (Non-Medical): Yes  Physical Activity:  Not on file  Stress: Stress Concern Present (01/24/2021)   [employer] of Occupational Health - Occupational Stress Questionnaire    Feeling of Stress : Very much  Social Connections: Unknown (05/01/2022)   [facility]   Social Network    Social Network: Not on file    No Known Allergies  Outpatient Medications Prior to Visit  Medication Sig Dispense Refill   aspirin EC 81 MG tablet Take 1 tablet (81 mg total) by mouth daily. Swallow whole. 30 tablet 1   carvedilol (COREG) 6.25 MG tablet Take 1 tablet (6.25 mg total) by mouth 2 (two) times daily. 180 tablet 3   furosemide (LASIX) 40 MG tablet Take 0.5 tablets (20 mg total) by mouth daily. 45 tablet 0   hydrOXYzine (ATARAX) 25 MG tablet Take 1 tablet (25 mg total) by mouth 3 (three) times daily as needed for anxiety. 60 tablet 1   isosorbide-hydrALAZINE (BIDIL) 20-37.5 MG tablet Take 1 tablet by mouth 3 (three) times daily. 270 tablet 1   melatonin 5 MG TABS Take 1 tablet (5 mg total) by mouth at bedtime. 30 tablet 1   sacubitril-valsartan (ENTRESTO) 97-103 MG Take 1 tablet by mouth 2 (two) times daily. 180 tablet 3   spironolactone (ALDACTONE) 25 MG tablet Take 1 tablet (25 mg total) by mouth daily. 90 tablet 1   timolol (TIMOPTIC) 0.5 % ophthalmic solution Place 1 drop into both eyes daily after breakfast. 5 mL 1   atorvastatin (LIPITOR) 80 MG tablet Take 1 tablet (80 mg total) by mouth daily. 90 tablet 1   ezetimibe (ZETIA) 10 MG tablet Take 1 tablet (10 mg total) by mouth daily. 90 tablet 1   gabapentin (NEURONTIN) 300 MG capsule Take 1 capsule (300 mg total) by mouth at bedtime. 30 capsule 3   metFORMIN (GLUCOPHAGE-XR) 500 MG 24 hr tablet Take 1 tablet (500 mg total) by mouth daily with breakfast. 90 tablet 1   sertraline (ZOLOFT) 100 MG tablet Take 1 tablet (100 mg total) by mouth daily. 90 tablet 1   No facility-administered medications prior to visit.     ROS Review of Systems   Constitutional:  Negative for activity change and appetite change.  HENT:  Negative for sinus pressure and sore throat.   Respiratory:  Positive for shortness of breath. Negative for chest tightness and wheezing.   Cardiovascular:  Negative for chest pain and palpitations.  Gastrointestinal:  Negative for abdominal distention, abdominal pain and constipation.  Genitourinary: Negative.   Musculoskeletal: Negative.   Psychiatric/Behavioral:  Negative for behavioral problems and dysphoric mood.     Objective:  BP (!) 149/85   Pulse 81   Ht 5\' 9"  (1.753 m)   Wt 178 lb (80.7  kg)   SpO2 100%   BMI 26.29 kg/m      11/04/2023    3:31 PM 11/04/2023    2:54 PM 08/05/2023    3:52 PM  BP/Weight  Systolic BP 149 149   Diastolic BP 85 87   Wt. (Lbs)  178 192.68  BMI  26.29 kg/m2 28.45 kg/m2    Wt Readings from Last 3 Encounters:  11/04/23 178 lb (80.7 kg)  08/05/23 192 lb 10.9 oz (87.4 kg)  07/04/23 192 lb 9.6 oz (87.4 kg)      Physical Exam Constitutional:      Appearance: He is well-developed.  Cardiovascular:     Rate and Rhythm: Normal rate.     Heart sounds: Normal heart sounds. No murmur heard. Pulmonary:     Effort: Pulmonary effort is normal.     Breath sounds: Normal breath sounds. No wheezing or rales.  Chest:     Chest wall: No tenderness.  Abdominal:     General: Bowel sounds are normal. There is no distension.     Palpations: Abdomen is soft. There is no mass.     Tenderness: There is no abdominal tenderness.  Musculoskeletal:        General: Normal range of motion.     Right lower leg: No edema.     Left lower leg: No edema.  Neurological:     Mental Status: He is alert and oriented to person, place, and time.  Psychiatric:        Mood and Affect: Mood normal.        Latest Ref Rng & Units 08/05/2023    3:58 PM 07/04/2023   11:36 AM 07/19/2022   12:07 PM  CMP  Glucose 70 - 99 mg/dL 098  119  147   BUN 6 - 20 mg/dL 9  15  19    Creatinine 0.61 -  1.24 mg/dL 8.29  5.62  1.30   Sodium 135 - 145 mmol/L 135  134  137   Potassium 3.5 - 5.1 mmol/L 3.8  3.5  4.4   Chloride 98 - 111 mmol/L 103  104  104   CO2 22 - 32 mmol/L 22  23  24    Calcium 8.9 - 10.3 mg/dL 9.0  8.8  9.4     Lipid Panel     Component Value Date/Time   CHOL 167 07/04/2023 1136   CHOL 144 03/07/2018 0834   TRIG 116 07/04/2023 1136   HDL 41 07/04/2023 1136   HDL 36 (L) 03/07/2018 0834   CHOLHDL 4.1 07/04/2023 1136   VLDL 23 07/04/2023 1136   LDLCALC 103 (H) 07/04/2023 1136   LDLCALC 73 03/07/2018 0834    CBC    Component Value Date/Time   WBC 3.8 (L) 08/05/2023 1558   RBC 5.05 08/05/2023 1558   HGB 14.7 08/05/2023 1558   HCT 43.4 08/05/2023 1558   PLT 250 08/05/2023 1558   MCV 85.9 08/05/2023 1558   MCH 29.1 08/05/2023 1558   MCHC 33.9 08/05/2023 1558   RDW 12.6 08/05/2023 1558   LYMPHSABS 1.5 04/21/2022 1300   MONOABS 0.6 04/21/2022 1300   EOSABS 0.1 04/21/2022 1300   BASOSABS 0.0 04/21/2022 1300    Lab Results  Component Value Date   HGBA1C 6.1 11/04/2023    Assessment & Plan:      Substance Use Disorder Completed rehab in October 2023 and currently abstinent. Significant weight loss since last visit.  He states he feels better since weight loss -  Continue current sobriety efforts.  Type 2 Diabetes Mellitus A1c 6.1, well controlled despite inconsistent medication use. -Refill Metformin prescription.  Hypertension Slightly above goal -Will recheck at next office visit -Refill antihypertensive medications. -Counseled on blood pressure goal of less than 130/80, low-sodium, DASH diet, medication compliance, 150 minutes of moderate intensity exercise per week. Discussed medication compliance, adverse effects.   Hypertensive heart disease Echocardiogram in July 2024 showed low heart function (25-30%). Patient reports intermittent shortness of breath and fatigue. -Advised to schedule appointment with cardiologist for discussion of ICD  placement. -Refill heart failure medications.  Depression/Anxiety Controlled Medication refill needed. -Refill Zoloft prescription.  Hyperlipidemia associated with type 2 diabetes mellitus LDL of 113 slightly above goal of less than 70 Continue statin Continue switching to Crestor if LDL is still above goal Low-cholesterol diet  General Health Maintenance -Referral for eye exam. -Follow-up in 3 months to monitor progress.          Meds ordered this encounter  Medications   atorvastatin (LIPITOR) 80 MG tablet    Sig: Take 1 tablet (80 mg total) by mouth daily.    Dispense:  90 tablet    Refill:  1   ezetimibe (ZETIA) 10 MG tablet    Sig: Take 1 tablet (10 mg total) by mouth daily.    Dispense:  90 tablet    Refill:  1   gabapentin (NEURONTIN) 300 MG capsule    Sig: Take 1 capsule (300 mg total) by mouth at bedtime.    Dispense:  30 capsule    Refill:  3    Dose change   metFORMIN (GLUCOPHAGE-XR) 500 MG 24 hr tablet    Sig: Take 1 tablet (500 mg total) by mouth daily with breakfast.    Dispense:  90 tablet    Refill:  1   sertraline (ZOLOFT) 100 MG tablet    Sig: Take 1 tablet (100 mg total) by mouth daily.    Dispense:  90 tablet    Refill:  1    Follow-up: Return in about 3 months (around 02/04/2024) for Chronic medical conditions.       [name], MD, FAAFP. [facility] [place], [place] [phone]   11/04/2023, 4:05 PM

## 2023-11-04 NOTE — Patient Instructions (Signed)
VISIT SUMMARY:  Today, we discussed your overall health and medication needs. You have made significant progress since completing rehab last year, including weight loss and improved diabetes management. We addressed your concerns about intermittent shortness of breath and fatigue, and we discussed your living situation. We also reviewed your need for medication refills and follow-up care with specialists.  YOUR PLAN:  -SUBSTANCE USE DISORDER: You have successfully completed rehab and have been abstinent since October 2023. Continue your current efforts to maintain sobriety.  -TYPE 2 DIABETES MELLITUS: Your diabetes is well controlled with an A1c of 6.1. We have refilled your Metformin prescription to help you continue managing your blood sugar levels.  -HYPERTENSION: Hypertension means high blood pressure. We have refilled your blood pressure medications to help manage this condition.  -HEART FAILURE: Heart failure means your heart is not pumping blood as well as it should. Your last echocardiogram showed low heart function. We have made an urgent referral to a cardiologist to discuss the possibility of an ICD (implantable cardioverter-defibrillator) and refilled your heart failure medications.  -DEPRESSION/ANXIETY: You have a history of depression and anxiety, for which you take Zoloft. We have refilled your prescription to help manage these conditions.  -GENERAL HEALTH MAINTENANCE: We have scheduled a follow-up appointment in 3 months to monitor your progress.  INSTRUCTIONS:  Please follow up with the cardiologist urgently to discuss the possibility of an ICD placement. Additionally, schedule an eye exam as referred. We will see you again in 3 months to monitor your progress and make any necessary adjustments to your treatment plan.

## 2023-11-05 ENCOUNTER — Other Ambulatory Visit: Payer: Self-pay

## 2023-11-05 NOTE — Progress Notes (Signed)
Advanced [facility] Failure Clinic Note    PCP: [name], MD HF Cardiologist: Dr. Shirlee Latch  Reason for Visit: F/u for chronic systolic heart failure   HPI: Matthew Petersonis a 57 y.o. with PMH significant for combined systolic CHF with biventricular failure, T2DM, HTN, OSA, tobacco use, HLD, cocaine use, poor medication adherence, depression,  CVA, and CKD 3a.     Matthew [name] was first admitted with HF in 2015 in the setting of long standing uncontrolled hypertension and smoking.  Had a LHC with normal cors.    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  Was not followed by cardiology or hospitalized for CHF during this time period.     March 2021 Matthew [name] began having CHF exacerbations after running out of meds between admissions.  Left AMA in March. Another similar admission in July, Matthew [name] was out of his medications at that time as well.     Admitted 12/2020 with CHF exacerbation after running out of his medications.  Matthew [name] was diuresed, put on an excellent medication regimen. Matthew [name] also endorsed suicidal ideation at that time and was subsequently admitted to behavioral health. UDS was positive for cocaine, echo showed EF < 20% with moderate RV dysfunction.   Matthew [name] was seen in the HF clinic 02/08/21 and lasix was cut back to 20 mg daily, later Lasix was stopped.   Echo 5/22 with EF improved to 50%.   Follow up 6/22, [name] was stopped due to dizziness.  Follow up 9/22 and was fluid overloaded and SOB. Had ran out of all of his meds, including lasix ~3 months prior. ReDs 46%. Given Lasix 80 mg IV x 1 in the office, GDMT adjusted. Instructed to f/u in 1 week but no showed to the appt.   Follow up 11/22 Matthew [name] had been off several medications, more SOB, continued to use cocaine. Mediations restarted, pillbox filled. Seen back 12/22, stable NYHA III, volume OK ReDs 35%. [name] restarted and advised 4 week follow up. No show x 2 appts.   Echo in 1/23 with EF  30-35%, mild LVH, normal RV.   Admitted 3/23 with SI and auditory hallucinations.   Today Matthew [name]/she returns for AHF/post hospital*** follow up. Overall feeling ***. Denies palpitations, CP, dizziness, edema, or PND/Orthopnea. *** SOB. Appetite ok. No fever or chills. Weight at home *** pounds. Taking all medications. ETOH, smoking *** . Housing ***?  ECG (personally reviewed from 8/24): NSR 80  Labs (5/22): K 3.9, creatinine 1.6 Labs (6/22): K 3.8, creatinine 1.49 Labs (8/22): K 4.6 creatinine 1.25 Labs (9/22): K 4.1, creatinine 1.29, LDL 40  Labs (11/22): K 3.7 creatinine 1.91 Labs (3/23): K 4.2, creatinine 1.2, LDL 101, HDL 54 Labs (5/23): K 3.6, creatinine 1.23 Labs (8/23): K 4.4, SCr 1.4 Labs (8/24): K 3.8, SCr 1.46  PMH: 1. HTN 2. Type 2 diabetes 3. H/o cocaine abuse: Positive UDS 1/22.  4. CVA 2018 5. OSA: Uses CPAP.  6. Chronic systolic CHF: Nonischemic cardiomyopathy.  - LHC (2015): Normal coronaries.  - Echo (2021): EF 40-45%. - Echo (1/22): EF <20%, moderate LV dilation, moderately decreased RV systolic function, severe LAE.  - Echo (5/22): EF 50%, moderate LV dilation, normal RV.  - Echo (1/23): EF 30-35%, moderately decreased LV, normal RV, ascending aorta 39 mm - Echo today: read pending 7. Depression 8. CKD stage 3 9. Hyperlipidemia  Current Outpatient Medications  Medication Sig Dispense Refill   aspirin EC 81 MG tablet Take  1 tablet (81 mg total) by mouth daily. Swallow whole. 30 tablet 1   atorvastatin (LIPITOR) 80 MG tablet Take 1 tablet (80 mg total) by mouth daily. 90 tablet 1   carvedilol (COREG) 6.25 MG tablet Take 1 tablet (6.25 mg total) by mouth 2 (two) times daily. 180 tablet 3   ezetimibe (ZETIA) 10 MG tablet Take 1 tablet (10 mg total) by mouth daily. 90 tablet 1   furosemide (LASIX) 40 MG tablet Take 0.5 tablets (20 mg total) by mouth daily. 45 tablet 0   gabapentin (NEURONTIN) 300 MG capsule Take 1 capsule (300 mg total) by mouth at bedtime. 30  capsule 3   hydrOXYzine (ATARAX) 25 MG tablet Take 1 tablet (25 mg total) by mouth 3 (three) times daily as needed for anxiety. 60 tablet 1   isosorbide-hydrALAZINE (BIDIL) 20-37.5 MG tablet Take 1 tablet by mouth 3 (three) times daily. 270 tablet 1   melatonin 5 MG TABS Take 1 tablet (5 mg total) by mouth at bedtime. 30 tablet 1   metFORMIN (GLUCOPHAGE-XR) 500 MG 24 hr tablet Take 1 tablet (500 mg total) by mouth daily with breakfast. 90 tablet 1   sacubitril-valsartan (ENTRESTO) 97-103 MG Take 1 tablet by mouth 2 (two) times daily. 180 tablet 3   sertraline (ZOLOFT) 100 MG tablet Take 1 tablet (100 mg total) by mouth daily. 90 tablet 1   spironolactone (ALDACTONE) 25 MG tablet Take 1 tablet (25 mg total) by mouth daily. 90 tablet 1   timolol (TIMOPTIC) 0.5 % ophthalmic solution Place 1 drop into both eyes daily after breakfast. 5 mL 1   No current facility-administered medications for this visit.   No Known Allergies  Social History   Socioeconomic History   Marital status: Widowed    Spouse name: Not on file   Number of children: 2   Years of education: 14   Highest education level: Not on file  Occupational History   Occupation: [employer]  Tobacco Use   Smoking status: Former    Types: Cigarettes   Smokeless tobacco: Former    Quit date: 11/14/2020  Vaping Use   Vaping status: Never Used  Substance and Sexual Activity   Alcohol use: No   Drug use: No   Sexual activity: Yes  Other Topics Concern   Not on file  Social History Narrative   Lives with 2 young children in [place].  Wife died in Apr 26, 2020.  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   Left-handed   Caffeine: occasional tea   Social Determinants of Health   Financial Resource Strain: High Risk (01/24/2021)   Overall Financial Resource Strain (CARDIA)    Difficulty of Paying Living Expenses: Very hard  Food Insecurity: Food Insecurity Present (01/24/2021)   Hunger Vital Sign    Worried  About Running Out of Food in the Last Year: Sometimes true    Ran Out of Food in the Last Year: Sometimes true  Transportation Needs: Unmet Transportation Needs (01/24/2021)   PRAPARE - Transportation    Lack of Transportation (Medical): Yes    Lack of Transportation (Non-Medical): Yes  Physical Activity: Not on file  Stress: Stress Concern Present (01/24/2021)   [employer] of Occupational Health - Occupational Stress Questionnaire    Feeling of Stress : Very much  Social Connections: Unknown (05/01/2022)   Received from [facility], [facility]   Social Network    Social Network: Not on file  Intimate Partner  Violence: Unknown (03/28/2022)   Received from [facility], [facility]   HITS    Physically Hurt: Not on file    Insult or Talk Down To: Not on file    Threaten Physical Harm: Not on file    Scream or Curse: Not on file   Family History  Problem Relation Age of Onset   Heart attack Father        died @ 36   Cervical cancer Mother        died @ 50   There were no vitals taken for this visit.  Wt Readings from Last 3 Encounters:  11/04/23 80.7 kg (178 lb)  08/05/23 87.4 kg (192 lb 10.9 oz)  07/04/23 87.4 kg (192 lb 9.6 oz)   PHYSICAL EXAM: General:  *** appearing.  No respiratory difficulty HEENT: normal Neck: supple. JVD *** cm. Carotids 2+ bilat; no bruits. No lymphadenopathy or thyromegaly appreciated. Cor: PMI nondisplaced. Regular rate & rhythm. No rubs, gallops or murmurs. Lungs: clear Abdomen: soft, nontender, nondistended. No hepatosplenomegaly. No bruits or masses. Good bowel sounds. Extremities: no cyanosis, clubbing, rash, edema  Neuro: alert & oriented x 3, cranial nerves grossly intact. moves all 4 extremities w/o difficulty. Affect pleasant.   ASSESSMENT & PLAN: 1. 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  Due to poorly controlled HTN +/- cocaine abuse.  Normal coronaries on 2015 cath.  Echo in 1/22 showed EF < 20% with moderate  RV dysfunction (off meds, + cocaine).  Echo in 5/22 with EF up to 50% (on meds and off cocaine). Echo 1/23 (off meds and on cocaine) EF down to 30-35%. Echo 7/24 EF 25-30%, GIDD, RV normal, LA mildly dilated, small pericardial effusion present. Matthew [name] is now living at [facility] and is off drugs and ETOH.  NYHA class I, not volume overloaded on exam. *** - Continue lasix 20 mg daily - Has not been on Entresto 2/2 headache? Per patient report. Will not restart today with elevated renal function - Continue spironolactone 25 mg daily.  - Continue Coreg 6.25 mg bid.  - Continue BiDil 1 tab tid. - Did not tolerate Farxiga due to dizziness  - EF remains low on most recent echo. Will refer to EP for possible ICD placement.  *** - Discussed importance of med compliance. BMET, BNP today 2. HTN: BP mildly elevated, GDMT as above. Has not had meds this morning.   3. OSA: Not compliant with CPAP, needs new one. Will arrange sleep study for new machine. Has not scheduled *** 4. Tobacco Use Disorder: Matthew [name] has quit smoking.  5. Cocaine Use Disorder:  Matthew [name] has quit using cocaine.   6. CKD 3: Check BMET today  7. Hyperlipidemia: Continue atorvastatin.  - LDL 16 8/23. Up to 103 7/24.  8. Depression: Controlled.  9. T2DM: management per PCP. - Did not tolerate Farxiga due to dizziness. 10. 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  Follow up in 3 months with Dr. Shirlee Latch ***  Alen Bleacher, NP 11/05/23

## 2023-11-12 ENCOUNTER — Telehealth (HOSPITAL_COMMUNITY): Payer: Self-pay

## 2023-11-12 NOTE — Telephone Encounter (Signed)
Called and was unable to leave a voice message to confirm/remind patient of their appointment at the [facility] Clinic on 11/13/23.

## 2023-11-13 ENCOUNTER — Ambulatory Visit (HOSPITAL_COMMUNITY)
Admission: RE | Admit: 2023-11-13 | Discharge: 2023-11-13 | Disposition: A | Discharge status: Home / Self Care | Payer: Medicaid Other | Source: Ambulatory Visit | Attending: Internal Medicine | Admitting: Internal Medicine

## 2023-11-13 ENCOUNTER — Encounter (HOSPITAL_COMMUNITY): Payer: Self-pay

## 2023-11-13 ENCOUNTER — Other Ambulatory Visit: Payer: Self-pay

## 2023-11-13 ENCOUNTER — Other Ambulatory Visit (HOSPITAL_COMMUNITY): Payer: Self-pay

## 2023-11-13 VITALS — BP 138/90 | HR 66 | Wt 177.2 lb

## 2023-11-13 DIAGNOSIS — I5082 Biventricular heart failure: Secondary | ICD-10-CM | POA: Diagnosis not present

## 2023-11-13 DIAGNOSIS — I5022 Chronic systolic (congestive) heart failure: Secondary | ICD-10-CM | POA: Insufficient documentation

## 2023-11-13 DIAGNOSIS — N1831 Chronic kidney disease, stage 3a: Secondary | ICD-10-CM | POA: Diagnosis not present

## 2023-11-13 DIAGNOSIS — N183 Chronic kidney disease, stage 3 unspecified: Secondary | ICD-10-CM

## 2023-11-13 DIAGNOSIS — E1122 Type 2 diabetes mellitus with diabetic chronic kidney disease: Secondary | ICD-10-CM | POA: Insufficient documentation

## 2023-11-13 DIAGNOSIS — Z720 Tobacco use: Secondary | ICD-10-CM | POA: Diagnosis not present

## 2023-11-13 DIAGNOSIS — G4733 Obstructive sleep apnea (adult) (pediatric): Secondary | ICD-10-CM

## 2023-11-13 DIAGNOSIS — R0602 Shortness of breath: Secondary | ICD-10-CM | POA: Insufficient documentation

## 2023-11-13 DIAGNOSIS — Z79899 Other long term (current) drug therapy: Secondary | ICD-10-CM | POA: Diagnosis not present

## 2023-11-13 DIAGNOSIS — E1121 Type 2 diabetes mellitus with diabetic nephropathy: Secondary | ICD-10-CM

## 2023-11-13 DIAGNOSIS — F32A Depression, unspecified: Secondary | ICD-10-CM | POA: Insufficient documentation

## 2023-11-13 DIAGNOSIS — E782 Mixed hyperlipidemia: Secondary | ICD-10-CM

## 2023-11-13 DIAGNOSIS — I428 Other cardiomyopathies: Secondary | ICD-10-CM | POA: Insufficient documentation

## 2023-11-13 DIAGNOSIS — Z87891 Personal history of nicotine dependence: Secondary | ICD-10-CM | POA: Insufficient documentation

## 2023-11-13 DIAGNOSIS — I130 Hypertensive heart and chronic kidney disease with heart failure and stage 1 through stage 4 chronic kidney disease, or unspecified chronic kidney disease: Secondary | ICD-10-CM | POA: Insufficient documentation

## 2023-11-13 DIAGNOSIS — E785 Hyperlipidemia, unspecified: Secondary | ICD-10-CM | POA: Diagnosis not present

## 2023-11-13 DIAGNOSIS — I509 Heart failure, unspecified: Secondary | ICD-10-CM

## 2023-11-13 DIAGNOSIS — F1410 Cocaine abuse, uncomplicated: Secondary | ICD-10-CM | POA: Diagnosis not present

## 2023-11-13 DIAGNOSIS — Z7984 Long term (current) use of oral hypoglycemic drugs: Secondary | ICD-10-CM | POA: Insufficient documentation

## 2023-11-13 DIAGNOSIS — Z8673 Personal history of transient ischemic attack (TIA), and cerebral infarction without residual deficits: Secondary | ICD-10-CM

## 2023-11-13 DIAGNOSIS — F0631 Mood disorder due to known physiological condition with depressive features: Secondary | ICD-10-CM

## 2023-11-13 LAB — RAPID URINE DRUG SCREEN, HOSP PERFORMED
Amphetamines: NOT DETECTED
Barbiturates: NOT DETECTED
Benzodiazepines: NOT DETECTED
Cocaine: POSITIVE — AB
Opiates: NOT DETECTED
Tetrahydrocannabinol: NOT DETECTED

## 2023-11-13 LAB — BASIC METABOLIC PANEL WITH GFR
Anion gap: 6 (ref 5–15)
BUN: 15 mg/dL (ref 6–20)
CO2: 27 mmol/L (ref 22–32)
Calcium: 8.7 mg/dL — ABNORMAL LOW (ref 8.9–10.3)
Chloride: 106 mmol/L (ref 98–111)
Creatinine, Ser: 1.51 mg/dL — ABNORMAL HIGH (ref 0.61–1.24)
GFR, Estimated: 54 mL/min — ABNORMAL LOW
Glucose, Bld: 75 mg/dL (ref 70–99)
Potassium: 3.6 mmol/L (ref 3.5–5.1)
Sodium: 139 mmol/L (ref 135–145)

## 2023-11-13 LAB — CBC
HCT: 39.8 % (ref 39.0–52.0)
Hemoglobin: 12.9 g/dL — ABNORMAL LOW (ref 13.0–17.0)
MCH: 28.7 pg (ref 26.0–34.0)
MCHC: 32.4 g/dL (ref 30.0–36.0)
MCV: 88.6 fL (ref 80.0–100.0)
Platelets: 248 10*3/uL (ref 150–400)
RBC: 4.49 MIL/uL (ref 4.22–5.81)
RDW: 12.8 % (ref 11.5–15.5)
WBC: 5.6 10*3/uL (ref 4.0–10.5)
nRBC: 0 % (ref 0.0–0.2)

## 2023-11-13 LAB — BRAIN NATRIURETIC PEPTIDE: B Natriuretic Peptide: 372.4 pg/mL — ABNORMAL HIGH (ref 0.0–100.0)

## 2023-11-13 MED ORDER — BIDIL 20-37.5 MG PO TABS
1.0000 | ORAL_TABLET | Freq: Three times a day (TID) | ORAL | 3 refills | Status: DC
  Filled 2023-11-13: qty 180, 60d supply, fill #0

## 2023-11-13 MED ORDER — ISOSORB DINITRATE-HYDRALAZINE 20-37.5 MG PO TABS
1.0000 | ORAL_TABLET | Freq: Three times a day (TID) | ORAL | 1 refills | Status: DC
  Filled 2023-11-13: qty 270, 90d supply, fill #0

## 2023-11-13 MED ORDER — CARVEDILOL 6.25 MG PO TABS
6.2500 mg | ORAL_TABLET | Freq: Two times a day (BID) | ORAL | 3 refills | Status: DC
  Filled 2023-11-13: qty 180, 90d supply, fill #0

## 2023-11-13 MED ORDER — FUROSEMIDE 40 MG PO TABS
40.0000 mg | ORAL_TABLET | Freq: Every day | ORAL | 3 refills | Status: DC
  Filled 2023-11-13: qty 90, 90d supply, fill #0

## 2023-11-13 NOTE — Patient Instructions (Addendum)
CHANGE Lasix to 40 mg daily.  Labs done today, your results will be available in MyChart, we will contact you for abnormal readings.  You have been referred to the electrophysiologist. They will call you to arrange your appointment.  You will be called to have your sleep study arranged.  Your physician recommends that you schedule a follow-up appointment in: 4 months ( March 2025) ** PLEASE CALL THE OFFICE IN [place] TO ARRANGE YOUR FOLLOW UP APPOINTMENT.**  If you have any questions or concerns before your next appointment please send [place] a message through [place] or call our office at [phone].    TO LEAVE A MESSAGE FOR THE NURSE SELECT OPTION 2, PLEASE LEAVE A MESSAGE INCLUDING: YOUR NAME DATE OF BIRTH CALL BACK NUMBER REASON FOR CALL**this is important as we prioritize the call backs  YOU WILL RECEIVE A CALL BACK THE SAME DAY AS LONG AS YOU CALL BEFORE 4:00 PM  At the [facility], you and your health needs are our priority. As part of our continuing mission to provide you with exceptional heart care, we have created designated Provider Care Teams. These Care Teams include your primary Cardiologist (physician) and Advanced Practice Providers (APPs- Physician Assistants and Nurse Practitioners) who all work together to provide you with the care you need, when you need it.   You may see any of the following providers on your designated Care Team at your next follow up: Dr Arvilla Meres Dr Marca Ancona Dr. Dorthula Nettles Dr. Clearnce Hasten Amy Filbert Schilder, NP Robbie Lis, [place] [facility] [place], [place] Brynda Peon, NP [place] Lee, NP Karle Plumber, PharmD   Please be sure to bring in all your medications bottles to every appointment.    Thank you for choosing [facility]

## 2023-11-13 NOTE — Addendum Note (Signed)
Encounter addended by: Suezanne Cheshire, RN on: 11/13/2023 2:55 PM  Actions taken: Charge Capture section accepted

## 2023-11-13 NOTE — Progress Notes (Signed)
ReDS Vest / Clip - 11/13/23 1400       ReDS Vest / Clip   Station Marker C    Ruler Value 29    ReDS Value Range Moderate volume overload    ReDS Actual Value 40

## 2023-11-16 ENCOUNTER — Other Ambulatory Visit: Payer: Self-pay

## 2023-11-16 ENCOUNTER — Encounter (HOSPITAL_COMMUNITY): Payer: Self-pay | Admitting: Student

## 2023-11-16 ENCOUNTER — Emergency Department (HOSPITAL_COMMUNITY): Payer: Medicare Other

## 2023-11-16 ENCOUNTER — Inpatient Hospital Stay (HOSPITAL_COMMUNITY)
Admission: EM | Admit: 2023-11-16 | Discharge: 2023-11-20 | DRG: 189 | Disposition: A | Discharge status: Home / Self Care | Payer: Medicare Other | Attending: Internal Medicine | Admitting: Internal Medicine

## 2023-11-16 DIAGNOSIS — F411 Generalized anxiety disorder: Secondary | ICD-10-CM | POA: Diagnosis present

## 2023-11-16 DIAGNOSIS — G4733 Obstructive sleep apnea (adult) (pediatric): Secondary | ICD-10-CM | POA: Diagnosis present

## 2023-11-16 DIAGNOSIS — I509 Heart failure, unspecified: Secondary | ICD-10-CM | POA: Diagnosis not present

## 2023-11-16 DIAGNOSIS — N1831 Chronic kidney disease, stage 3a: Secondary | ICD-10-CM | POA: Diagnosis present

## 2023-11-16 DIAGNOSIS — I5082 Biventricular heart failure: Secondary | ICD-10-CM | POA: Diagnosis present

## 2023-11-16 DIAGNOSIS — Z79899 Other long term (current) drug therapy: Secondary | ICD-10-CM

## 2023-11-16 DIAGNOSIS — I160 Hypertensive urgency: Secondary | ICD-10-CM | POA: Diagnosis present

## 2023-11-16 DIAGNOSIS — F339 Major depressive disorder, recurrent, unspecified: Secondary | ICD-10-CM | POA: Diagnosis present

## 2023-11-16 DIAGNOSIS — I5043 Acute on chronic combined systolic (congestive) and diastolic (congestive) heart failure: Secondary | ICD-10-CM | POA: Diagnosis present

## 2023-11-16 DIAGNOSIS — Z8049 Family history of malignant neoplasm of other genital organs: Secondary | ICD-10-CM

## 2023-11-16 DIAGNOSIS — D72829 Elevated white blood cell count, unspecified: Secondary | ICD-10-CM | POA: Diagnosis present

## 2023-11-16 DIAGNOSIS — Z5982 Transportation insecurity: Secondary | ICD-10-CM

## 2023-11-16 DIAGNOSIS — I639 Cerebral infarction, unspecified: Secondary | ICD-10-CM | POA: Diagnosis present

## 2023-11-16 DIAGNOSIS — J810 Acute pulmonary edema: Secondary | ICD-10-CM | POA: Diagnosis present

## 2023-11-16 DIAGNOSIS — Z91199 Patient's noncompliance with other medical treatment and regimen due to unspecified reason: Secondary | ICD-10-CM

## 2023-11-16 DIAGNOSIS — I3139 Other pericardial effusion (noninflammatory): Secondary | ICD-10-CM | POA: Diagnosis present

## 2023-11-16 DIAGNOSIS — E876 Hypokalemia: Secondary | ICD-10-CM | POA: Diagnosis present

## 2023-11-16 DIAGNOSIS — F1410 Cocaine abuse, uncomplicated: Secondary | ICD-10-CM | POA: Diagnosis present

## 2023-11-16 DIAGNOSIS — E872 Acidosis, unspecified: Secondary | ICD-10-CM | POA: Diagnosis present

## 2023-11-16 DIAGNOSIS — N182 Chronic kidney disease, stage 2 (mild): Secondary | ICD-10-CM | POA: Diagnosis present

## 2023-11-16 DIAGNOSIS — F0631 Mood disorder due to known physiological condition with depressive features: Secondary | ICD-10-CM | POA: Diagnosis present

## 2023-11-16 DIAGNOSIS — Z8673 Personal history of transient ischemic attack (TIA), and cerebral infarction without residual deficits: Secondary | ICD-10-CM | POA: Diagnosis not present

## 2023-11-16 DIAGNOSIS — I10 Essential (primary) hypertension: Secondary | ICD-10-CM | POA: Diagnosis present

## 2023-11-16 DIAGNOSIS — J9601 Acute respiratory failure with hypoxia: Principal | ICD-10-CM | POA: Diagnosis present

## 2023-11-16 DIAGNOSIS — I428 Other cardiomyopathies: Secondary | ICD-10-CM | POA: Diagnosis present

## 2023-11-16 DIAGNOSIS — I130 Hypertensive heart and chronic kidney disease with heart failure and stage 1 through stage 4 chronic kidney disease, or unspecified chronic kidney disease: Secondary | ICD-10-CM | POA: Diagnosis present

## 2023-11-16 DIAGNOSIS — E1122 Type 2 diabetes mellitus with diabetic chronic kidney disease: Secondary | ICD-10-CM | POA: Diagnosis present

## 2023-11-16 DIAGNOSIS — I110 Hypertensive heart disease with heart failure: Secondary | ICD-10-CM

## 2023-11-16 DIAGNOSIS — Z8249 Family history of ischemic heart disease and other diseases of the circulatory system: Secondary | ICD-10-CM

## 2023-11-16 DIAGNOSIS — E119 Type 2 diabetes mellitus without complications: Secondary | ICD-10-CM

## 2023-11-16 DIAGNOSIS — E782 Mixed hyperlipidemia: Secondary | ICD-10-CM | POA: Diagnosis present

## 2023-11-16 DIAGNOSIS — Z7984 Long term (current) use of oral hypoglycemic drugs: Secondary | ICD-10-CM

## 2023-11-16 DIAGNOSIS — Z91148 Patient's other noncompliance with medication regimen for other reason: Secondary | ICD-10-CM

## 2023-11-16 DIAGNOSIS — I5021 Acute systolic (congestive) heart failure: Secondary | ICD-10-CM | POA: Diagnosis not present

## 2023-11-16 DIAGNOSIS — H5461 Unqualified visual loss, right eye, normal vision left eye: Secondary | ICD-10-CM | POA: Diagnosis present

## 2023-11-16 DIAGNOSIS — I5033 Acute on chronic diastolic (congestive) heart failure: Secondary | ICD-10-CM

## 2023-11-16 DIAGNOSIS — J9621 Acute and chronic respiratory failure with hypoxia: Secondary | ICD-10-CM | POA: Diagnosis not present

## 2023-11-16 DIAGNOSIS — E785 Hyperlipidemia, unspecified: Secondary | ICD-10-CM

## 2023-11-16 DIAGNOSIS — F1490 Cocaine use, unspecified, uncomplicated: Secondary | ICD-10-CM | POA: Diagnosis present

## 2023-11-16 DIAGNOSIS — Z5986 Financial insecurity: Secondary | ICD-10-CM

## 2023-11-16 DIAGNOSIS — Z7982 Long term (current) use of aspirin: Secondary | ICD-10-CM

## 2023-11-16 DIAGNOSIS — F17200 Nicotine dependence, unspecified, uncomplicated: Secondary | ICD-10-CM | POA: Diagnosis present

## 2023-11-16 HISTORY — DX: Generalized anxiety disorder: F41.1

## 2023-11-16 HISTORY — DX: Type 2 diabetes mellitus without complications: E11.9

## 2023-11-16 HISTORY — DX: Cerebral infarction, unspecified: I63.9

## 2023-11-16 HISTORY — DX: Unspecified systolic (congestive) heart failure: I50.20

## 2023-11-16 HISTORY — DX: Cocaine abuse, uncomplicated: F14.10

## 2023-11-16 HISTORY — DX: Chronic kidney disease, stage 3 unspecified: N18.30

## 2023-11-16 HISTORY — DX: Obstructive sleep apnea (adult) (pediatric): G47.33

## 2023-11-16 LAB — I-STAT ARTERIAL BLOOD GAS, ED
Acid-base deficit: 1 mmol/L (ref 0.0–2.0)
Bicarbonate: 24.1 mmol/L (ref 20.0–28.0)
Calcium, Ion: 1.26 mmol/L (ref 1.15–1.40)
HCT: 46 % (ref 39.0–52.0)
Hemoglobin: 15.6 g/dL (ref 13.0–17.0)
O2 Saturation: 98 %
Patient temperature: 98.6
Potassium: 3.4 mmol/L — ABNORMAL LOW (ref 3.5–5.1)
Sodium: 142 mmol/L (ref 135–145)
TCO2: 25 mmol/L (ref 22–32)
pCO2 arterial: 39.4 mm[Hg] (ref 32–48)
pH, Arterial: 7.395 (ref 7.35–7.45)
pO2, Arterial: 112 mm[Hg] — ABNORMAL HIGH (ref 83–108)

## 2023-11-16 LAB — CBC WITH DIFFERENTIAL/PLATELET
Abs Immature Granulocytes: 0.02 10*3/uL (ref 0.00–0.07)
Basophils Absolute: 0 10*3/uL (ref 0.0–0.1)
Basophils Relative: 0 %
Eosinophils Absolute: 0 10*3/uL (ref 0.0–0.5)
Eosinophils Relative: 0 %
HCT: 45.9 % (ref 39.0–52.0)
Hemoglobin: 15.4 g/dL (ref 13.0–17.0)
Immature Granulocytes: 0 %
Lymphocytes Relative: 3 %
Lymphs Abs: 0.3 10*3/uL — ABNORMAL LOW (ref 0.7–4.0)
MCH: 29.8 pg (ref 26.0–34.0)
MCHC: 33.6 g/dL (ref 30.0–36.0)
MCV: 89 fL (ref 80.0–100.0)
Monocytes Absolute: 0.4 10*3/uL (ref 0.1–1.0)
Monocytes Relative: 5 %
Neutro Abs: 8.2 10*3/uL — ABNORMAL HIGH (ref 1.7–7.7)
Neutrophils Relative %: 92 %
Platelets: 234 10*3/uL (ref 150–400)
RBC: 5.16 MIL/uL (ref 4.22–5.81)
RDW: 12.9 % (ref 11.5–15.5)
WBC: 8.9 10*3/uL (ref 4.0–10.5)
nRBC: 0 % (ref 0.0–0.2)

## 2023-11-16 LAB — I-STAT CG4 LACTIC ACID, ED
Lactic Acid, Venous: 1.7 mmol/L (ref 0.5–1.9)
Lactic Acid, Venous: 2 mmol/L (ref 0.5–1.9)

## 2023-11-16 LAB — BASIC METABOLIC PANEL
Anion gap: 12 (ref 5–15)
BUN: 14 mg/dL (ref 6–20)
CO2: 20 mmol/L — ABNORMAL LOW (ref 22–32)
Calcium: 8.9 mg/dL (ref 8.9–10.3)
Chloride: 106 mmol/L (ref 98–111)
Creatinine, Ser: 1.17 mg/dL (ref 0.61–1.24)
GFR, Estimated: >60 mL/min (ref >60)
Glucose, Bld: 142 mg/dL — ABNORMAL HIGH (ref 70–99)
Potassium: 3.6 mmol/L (ref 3.5–5.1)
Sodium: 138 mmol/L (ref 135–145)

## 2023-11-16 LAB — HIV ANTIBODY (ROUTINE TESTING W REFLEX): HIV Screen 4th Generation wRfx: NONREACTIVE

## 2023-11-16 LAB — I-STAT CHEM 8, ED
BUN: 15 mg/dL (ref 6–20)
Calcium, Ion: 1.09 mmol/L — ABNORMAL LOW (ref 1.15–1.40)
Chloride: 106 mmol/L (ref 98–111)
Creatinine, Ser: 1.3 mg/dL — ABNORMAL HIGH (ref 0.61–1.24)
Glucose, Bld: 147 mg/dL — ABNORMAL HIGH (ref 70–99)
HCT: 46 % (ref 39.0–52.0)
Hemoglobin: 15.6 g/dL (ref 13.0–17.0)
Potassium: 3.6 mmol/L (ref 3.5–5.1)
Sodium: 141 mmol/L (ref 135–145)
TCO2: 21 mmol/L — ABNORMAL LOW (ref 22–32)

## 2023-11-16 LAB — TROPONIN I (HIGH SENSITIVITY)
Troponin I (High Sensitivity): 34 ng/L — ABNORMAL HIGH (ref <18)
Troponin I (High Sensitivity): 37 ng/L — ABNORMAL HIGH (ref <18)
Troponin I (High Sensitivity): 45 ng/L — ABNORMAL HIGH (ref <18)

## 2023-11-16 LAB — RAPID URINE DRUG SCREEN, HOSP PERFORMED
Amphetamines: NOT DETECTED
Barbiturates: NOT DETECTED
Benzodiazepines: NOT DETECTED
Cocaine: POSITIVE — AB
Opiates: NOT DETECTED
Tetrahydrocannabinol: NOT DETECTED

## 2023-11-16 LAB — BRAIN NATRIURETIC PEPTIDE: B Natriuretic Peptide: 788.9 pg/mL — ABNORMAL HIGH (ref 0.0–100.0)

## 2023-11-16 LAB — ETHANOL: Alcohol, Ethyl (B): <10 mg/dL (ref <10)

## 2023-11-16 LAB — LACTIC ACID, PLASMA: Lactic Acid, Venous: 2.3 mmol/L (ref 0.5–1.9)

## 2023-11-16 MED ORDER — ASPIRIN 81 MG PO TBEC
81.0000 mg | DELAYED_RELEASE_TABLET | Freq: Every day | ORAL | Status: DC
Start: 2023-11-16 — End: 2023-11-20
  Administered 2023-11-16 – 2023-11-20 (×5): 81 mg via ORAL
  Filled 2023-11-16 (×6): qty 1

## 2023-11-16 MED ORDER — NITROGLYCERIN IN D5W 200-5 MCG/ML-% IV SOLN
1000.0000 ug/min | Freq: Once | INTRAVENOUS | Status: AC
  Administered 2023-11-16: 1000 ug/min via INTRAVENOUS

## 2023-11-16 MED ORDER — IRBESARTAN 75 MG PO TABS
37.5000 mg | ORAL_TABLET | Freq: Every day | ORAL | Status: DC
  Administered 2023-11-16 – 2023-11-18 (×3): 37.5 mg via ORAL
  Filled 2023-11-16 (×3): qty 1

## 2023-11-16 MED ORDER — ENOXAPARIN SODIUM 40 MG/0.4ML IJ SOSY
40.0000 mg | PREFILLED_SYRINGE | INTRAMUSCULAR | Status: DC
Start: 2023-11-16 — End: 2023-11-20
  Administered 2023-11-16 – 2023-11-19 (×4): 40 mg via SUBCUTANEOUS
  Filled 2023-11-16 (×4): qty 0.4

## 2023-11-16 MED ORDER — CARVEDILOL 3.125 MG PO TABS: 6.2500 mg | ORAL_TABLET | Freq: Two times a day (BID) | ORAL | Status: DC

## 2023-11-16 MED ORDER — SPIRONOLACTONE 25 MG PO TABS
25.0000 mg | ORAL_TABLET | Freq: Every day | ORAL | Status: DC
  Administered 2023-11-16 – 2023-11-20 (×5): 25 mg via ORAL
  Filled 2023-11-16 (×6): qty 1

## 2023-11-16 MED ORDER — EZETIMIBE 10 MG PO TABS
10.0000 mg | ORAL_TABLET | Freq: Every day | ORAL | Status: DC
  Administered 2023-11-16 – 2023-11-20 (×5): 10 mg via ORAL
  Filled 2023-11-16 (×6): qty 1

## 2023-11-16 MED ORDER — NITROGLYCERIN IN D5W 200-5 MCG/ML-% IV SOLN
0.0000 ug/min | INTRAVENOUS | Status: DC
  Administered 2023-11-16: 100 ug/min via INTRAVENOUS
  Administered 2023-11-16: 200 ug/min via INTRAVENOUS
  Filled 2023-11-16 (×3): qty 250

## 2023-11-16 MED ORDER — ONDANSETRON HCL 4 MG/2ML IJ SOLN
4.0000 mg | Freq: Four times a day (QID) | INTRAMUSCULAR | Status: DC | PRN
  Administered 2023-11-16: 4 mg via INTRAVENOUS
  Filled 2023-11-16: qty 2

## 2023-11-16 MED ORDER — FUROSEMIDE 10 MG/ML IJ SOLN
80.0000 mg | Freq: Once | INTRAMUSCULAR | Status: AC
  Administered 2023-11-16: 80 mg via INTRAVENOUS
  Filled 2023-11-16: qty 8

## 2023-11-16 MED ORDER — ACETAMINOPHEN 650 MG RE SUPP: 650.0000 mg | Freq: Four times a day (QID) | RECTAL | Status: DC | PRN

## 2023-11-16 MED ORDER — MELATONIN 3 MG PO TABS
3.0000 mg | ORAL_TABLET | Freq: Every evening | ORAL | Status: DC | PRN
  Administered 2023-11-17 – 2023-11-19 (×3): 3 mg via ORAL
  Filled 2023-11-16 (×3): qty 1

## 2023-11-16 MED ORDER — FUROSEMIDE 10 MG/ML IJ SOLN
80.0000 mg | Freq: Every day | INTRAMUSCULAR | Status: DC
  Administered 2023-11-17 – 2023-11-18 (×2): 80 mg via INTRAVENOUS
  Filled 2023-11-16 (×2): qty 8

## 2023-11-16 MED ORDER — SERTRALINE HCL 100 MG PO TABS
100.0000 mg | ORAL_TABLET | Freq: Every day | ORAL | Status: DC
  Administered 2023-11-16 – 2023-11-20 (×5): 100 mg via ORAL
  Filled 2023-11-16 (×6): qty 1

## 2023-11-16 MED ORDER — ACETAMINOPHEN 325 MG PO TABS
650.0000 mg | ORAL_TABLET | Freq: Four times a day (QID) | ORAL | Status: DC | PRN
  Administered 2023-11-17: 650 mg via ORAL
  Filled 2023-11-16: qty 2

## 2023-11-16 MED ORDER — ATORVASTATIN CALCIUM 80 MG PO TABS
80.0000 mg | ORAL_TABLET | Freq: Every day | ORAL | Status: DC
  Administered 2023-11-16 – 2023-11-20 (×5): 80 mg via ORAL
  Filled 2023-11-16 (×6): qty 1

## 2023-11-16 NOTE — ED Notes (Signed)
EDP stated to start pt on 100 mcg Nitroglycerin.

## 2023-11-16 NOTE — H&P (Addendum)
History and Physical    Patient: Matthew [name]. ZOX:[id number] DOB: 07/22/1966 DOA: 11/16/2023 DOS: the patient was seen and examined on 11/16/2023 PCP: [name], MD  Patient coming from: Home  Chief Complaint:  Chief Complaint  Patient presents with   Shortness of Breath   HPI: Matthew [name]. is a 57 y.o. 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  Patient is a difficult historian, unable to elicit much information from him.  He states that he suddenly became short of breath this morning and this did not resolve.  It became progressively worse.  States that this has happened before.  He does endorse cocaine use over the past 2 days, last use yesterday.  He states that he does take his home medications regularly, although unable to tell me which medications he is currently on.  He denies any headaches, vision changes, nausea, fevers, cough, chest pain, palpitations, abdominal pain, diarrhea, urinary changes.  First responders noted that patient had O2 sats in the 80s on room air.  They placed patient on nonrebreather and gave him albuterol, Solu-Medrol, nitroglycerin en route to [facility] ED.  ED course: Patient placed on BiPAP on arrival. CBC unremarkable.  BMP with bicarb 20, glucose 142, creatinine 1.17 (around his baseline).  BNP 789.  Negative for alcohol.  Urine drug screen positive for cocaine use.  Troponins 34 > 37. Lactic acid initially normal but still mildly elevated on recheck at 2.0.  I-STAT ABG unremarkable.  Chest x-ray showing likely pulmonary edema in R hemithorax.  Patient given IV Lasix and started on nitroglycerin drip per ED provider.  [facility] hospitalist asked to evaluate patient for admission.   Review of Systems:  As mentioned in the history of present illness. All other systems reviewed and are negative. Past Medical History:  Diagnosis Date   Acute combined systolic and diastolic CHF, NYHA class 3 (HCC)    a. 02/2014 Echo: EF 25-30%.   Blindness of right eye    Cardiomyopathy (HCC)    a. 02/2014 Echo: EF 25-30%, sev glob HK with inferolat HK->AK, mod conc LVH, Gr 2 DD, Mild MR, sev dil LA.   Depression    DM (diabetes mellitus) (HCC)    High cholesterol    Hypertension    Morbid obesity (HCC)    Tobacco abuse    Past Surgical History:  Procedure Laterality Date   CATARACT EXTRACTION Right    LEFT HEART CATHETERIZATION WITH CORONARY ANGIOGRAM N/A 03/04/2014   Procedure: LEFT HEART CATHETERIZATION WITH CORONARY ANGIOGRAM;  Surgeon: Kathleene Hazel, MD;  Location: [facility] CATH LAB;  Service: Cardiovascular;  Laterality: N/A;   TEE WITHOUT CARDIOVERSION N/A 02/04/2017   Procedure: TRANSESOPHAGEAL ECHOCARDIOGRAM (TEE);  Surgeon: Lars Masson, MD;  Location: [facility] ENDOSCOPY;  Service: Cardiovascular;  Laterality: N/A;   Social History:  reports that he has quit smoking. His smoking use included cigarettes. He quit smokeless tobacco use about 3 years ago. He reports that he does not drink alcohol and does not use drugs.  No Known Allergies  Family History  Problem Relation Age of Onset   Heart attack Father        died @ 18   Cervical cancer Mother        died @ 62  Prior to Admission medications   Medication Sig Start Date End Date Taking? Authorizing Provider  aspirin EC 81 MG tablet Take 1 tablet (81 mg total) by mouth daily. Swallow whole. 07/16/22   Mayers, Cari S, PA-C  atorvastatin (LIPITOR) 80 MG tablet Take 1 tablet (80 mg total) by mouth daily. 11/04/23   [name], MD  BIDIL 20-37.5 MG tablet Take 1 tablet by mouth 3 (three) times daily. 11/13/23   Alen Bleacher, NP  carvedilol (COREG) 6.25 MG tablet Take 1 tablet (6.25 mg total) by mouth 2 (two) times daily. 11/13/23    Alen Bleacher, NP  ezetimibe (ZETIA) 10 MG tablet Take 1 tablet (10 mg total) by mouth daily. 11/04/23   [name], MD  furosemide (LASIX) 40 MG tablet Take 1 tablet (40 mg total) by mouth daily. 11/13/23   Alen Bleacher, NP  gabapentin (NEURONTIN) 300 MG capsule Take 1 capsule (300 mg total) by mouth at bedtime. 11/04/23   [name], MD  hydrOXYzine (ATARAX) 25 MG tablet Take 1 tablet (25 mg total) by mouth 3 (three) times daily as needed for anxiety. 09/19/22   Mayers, Cari S, PA-C  melatonin 5 MG TABS Take 1 tablet (5 mg total) by mouth at bedtime. 07/16/22   Mayers, Cari S, PA-C  metFORMIN (GLUCOPHAGE-XR) 500 MG 24 hr tablet Take 1 tablet (500 mg total) by mouth daily with breakfast. 11/04/23   [name], MD  sertraline (ZOLOFT) 100 MG tablet Take 1 tablet (100 mg total) by mouth daily. 11/04/23   [name], MD  spironolactone (ALDACTONE) 25 MG tablet Take 1 tablet (25 mg total) by mouth daily. 08/13/22   [name], MD  timolol (TIMOPTIC) 0.5 % ophthalmic solution Place 1 drop into both eyes daily after breakfast. 07/16/22   Mayers, Cari S, PA-C  lovastatin (MEVACOR) 20 MG tablet Take 2 tablets (40 mg total) by mouth at bedtime. 06/24/14 08/26/14  Doris Cheadle, MD    Physical Exam: Vitals:   11/16/23 1600 11/16/23 1615 11/16/23 1650 11/16/23 1715  BP: (!) 161/104 (!) 163/118 (!) 182/119 (!) 183/118  Pulse: 93 84 93 91  Resp: (!) 26 (!) 27 (!) 31 (!) 28  SpO2: 96% 99% 96% 97%   Physical Exam Constitutional:      Appearance: He is normal weight.     Comments: Chronically ill-appearing  HENT:     Head: Normocephalic and atraumatic.     Mouth/Throat:     Mouth: Mucous membranes are dry.     Pharynx: No oropharyngeal exudate.  Eyes:     Extraocular Movements: Extraocular movements intact.     Pupils: Pupils are equal, round, and reactive to light.  Cardiovascular:     Rate and Rhythm: Normal rate and regular rhythm.     Pulses: Normal pulses.     Heart  sounds: Normal heart sounds. No murmur heard.    No friction rub. No gallop.  Pulmonary:     Effort: Tachypnea present. No accessory muscle usage or respiratory distress.     Breath sounds: Examination of the right-middle field reveals rales. Examination of the right-lower field reveals rales. Rales present. No wheezing or rhonchi.  Abdominal:     General: Bowel sounds are normal. There is no distension.     Palpations: Abdomen is soft.     Tenderness: There is no abdominal tenderness. There is no guarding or rebound.  Musculoskeletal:        General: Normal range of motion.  Cervical back: Normal range of motion.     Comments: Trace edema in bilateral lower extremities  Skin:    General: Skin is warm and dry.  Neurological:     General: No focal deficit present.     Mental Status: He is alert and oriented to person, place, and time.     Data Reviewed:  There are no new results to review at this time.  CBC    Component Value Date/Time   WBC 8.9 11/16/2023 1520   RBC 5.16 11/16/2023 1520   HGB 15.6 11/16/2023 1526   HCT 46.0 11/16/2023 1526   PLT 234 11/16/2023 1520   MCV 89.0 11/16/2023 1520   MCH 29.8 11/16/2023 1520   MCHC 33.6 11/16/2023 1520   RDW 12.9 11/16/2023 1520   LYMPHSABS 0.3 (L) 11/16/2023 1520   MONOABS 0.4 11/16/2023 1520   EOSABS 0.0 11/16/2023 1520   BASOSABS 0.0 11/16/2023 1520      Latest Ref Rng & Units 11/16/2023    3:26 PM 11/16/2023    3:20 PM 11/16/2023    2:51 PM  BMP  Glucose 70 - 99 mg/dL 324  401    BUN 6 - 20 mg/dL 15  14    Creatinine 0.27 - 1.24 mg/dL 2.53  6.64    Sodium 403 - 145 mmol/L 141  138  142   Potassium 3.5 - 5.1 mmol/L 3.6  3.6  3.4   Chloride 98 - 111 mmol/L 106  106    CO2 22 - 32 mmol/L  20    Calcium 8.9 - 10.3 mg/dL  8.9     BNP    Component Value Date/Time   BNP 788.9 (H) 11/16/2023 1520   Lactic Acid, Venous    Component Value Date/Time   LATICACIDVEN 2.0 (HH) 11/16/2023 1822   Drugs of Abuse      Component Value Date/Time   LABOPIA NONE DETECTED 11/16/2023 1501   COCAINSCRNUR POSITIVE (A) 11/16/2023 1501   LABBENZ NONE DETECTED 11/16/2023 1501   AMPHETMU NONE DETECTED 11/16/2023 1501   THCU NONE DETECTED 11/16/2023 1501   LABBARB NONE DETECTED 11/16/2023 1501    ABG    Component Value Date/Time   PHART 7.395 11/16/2023 1451   PCO2ART 39.4 11/16/2023 1451   PO2ART 112 (H) 11/16/2023 1451   HCO3 24.1 11/16/2023 1451   TCO2 21 (L) 11/16/2023 1526   ACIDBASEDEF 1.0 11/16/2023 1451   O2SAT 98 11/16/2023 1451   Troponin 34 > 37   Assessment and Plan: No notes have been filed under this hospital service. Service: Hospitalist  Acute hypoxic respiratory failure secondary to acute pulmonary edema Decompensated combined systolic and diastolic HF  Cardiomyopathy Patient presenting with acute hypoxic respiratory failure likely secondary to volume overload.  He appears to be in heart failure exacerbation likely from underlying cocaine use.  Last echo in July 2024 showed LVEF 25 to 30%, severely decreased LV function with global hypokinesis, moderate LV dilation, grade 1 diastolic dysfunction.  He was initially placed on BiPAP, but removed it himself.  He is currently on 3 L nasal cannula with oxygen saturations in the upper 90s.  BNP is 789, reflective of volume overload.  His chest x-ray also shows significant right sided pulmonary edema.  He does not have much volume overload in the periphery but appears to be centrally located.  He is on Coreg, spironolactone, BiDil, Lasix 40 mg daily at home.  Will restart spironolactone.  Holding Coreg given decompensated heart failure and holding  BiDil given he is currently on a nitroglycerin drip.  Consulted cardiology who will come to evaluate patient. -Greatly appreciate cardiology assistance -Admit to inpatient/progressive -Continue nitroglycerin drip -IV Lasix 80 mg daily -Resumed home spironolactone -Holding Coreg given decompensated heart  failure -Holding BiDil given currently on nitroglycerin drip -Refusing BiPAP, continue supplemental oxygen as needed to maintain O2 sats above 92%, wean as tolerated -Trend lactate until clears -f/u ECHO -HH/CM diet -Strict I/O's  Hypertensive emergency vs chronic uncontrolled HTN Patient reports nonadherence with home carvedilol, spironolactone, BiDil for the past few days.  BP in the ED on arrival was 170s to 180s over 110s.  BP currently 190/130.  He does have acute pulmonary edema along with mildly elevated troponins, concerning for hypertensive emergency although it is unclear if his BP has remained this elevated chronically.  He is currently on a nitroglycerin drip and I have restarted his home spironolactone.  Cardiology has been consulted, will evaluate. -Continue nitroglycerin drip -Resume home spironolactone -Restart home carvedilol and BiDil once able -Goal BP <180/<120 in the first hour and then <160/<110 over next 23 hours  Type 2 NSTEMI Troponins 34 with repeat troponin 37.  EKG showing sinus tachycardia and LVH, but no acute ischemic changes noted.  Will continue to trend troponins until they peak, but this does appear to be demand ischemia in the setting of heart failure exacerbation and cocaine use. -Trend troponins until they peak -Encourage cessation of cocaine use  Cocaine Use Disorder Likely playing a major role in his underlying heart failure and cardiomyopathy.  His last use was yesterday.  He has had positive cocaine use on several urine drug screens dating back to 2 years ago. -TOC consult for substance use resources  Type 2 diabetes On metformin XR at home.  CBG 142.  Will hold off on starting SSI at this time. -Trend CBGs, goal 140-180 -Start SSI if above goal -Resume metformin at discharge  CKD stage II GFR greater than 60 on arrival, consistent with CKD stage II.  Does not appear to have an AKI at this time.  History of stroke -Continue home Lipitor and  aspirin  Depression -Continue home Zoloft  OSA -Nonadherent with CPAP   Advance Care Planning:   Code Status: Full Code Confirmed with patient at bedside.   Consults: cardiology  Family Communication: none at bedside  Severity of Illness: The appropriate patient status for this patient is INPATIENT. Inpatient status is judged to be reasonable and necessary in order to provide the required intensity of service to ensure the patient's safety. The patient's presenting symptoms, physical exam findings, and initial radiographic and laboratory data in the context of their chronic comorbidities is felt to place them at high risk for further clinical deterioration. Furthermore, it is not anticipated that the patient will be medically stable for discharge from the hospital within 2 midnights of admission.   * I certify that at the point of admission it is my clinical judgment that the patient will require inpatient hospital care spanning beyond 2 midnights from the point of admission due to high intensity of service, high risk for further deterioration and high frequency of surveillance required.*  Portions of this note were generated with Dragon dictation software. Dictation errors may occur despite best attempts at proofreading.   Author: Briscoe Burns, MD 11/16/2023 7:05 PM  For on call review [email or web address].

## 2023-11-16 NOTE — ED Notes (Signed)
Pt resting in bed with even chest rise and fall

## 2023-11-16 NOTE — Progress Notes (Signed)
[facility] night cross cover note:   I was notified by RN regarding this patient's mildly elevated lactic acid level at 2.3.  He is here with acutely decompensated heart failure.  Afebrile, no leukocytosis, oxygen saturation 98 to 99% on 3 L nasal cannula.  I have placed order for repeat lactic acid with morning labs.    Newton Pigg, DO Hospitalist

## 2023-11-16 NOTE — Progress Notes (Signed)
Patient ripped off Bipap mask & refuses to wear anymore. Placed on 3L [place].

## 2023-11-16 NOTE — Consult Note (Signed)
Cardiology Consultation   Patient ID: Matthew [name]. MRN: [id number]; DOB: October 19, 1966  Admit date: 11/16/2023 Date of Consult: 11/16/2023  PCP:  [name], MD   [facility] Providers Cardiologist:  None        Patient Profile:   Matthew [name]. is a 57 y.o. male with a hx of HFrEF, cocaine use disorder, DM2, HTN, tobacco use, hx of prior stroke, MDD who is being admitted to the hospitalist team for acute hypoxic respiratory failure concern for decompensated heart failure.  History of Present Illness:   Matthew [name]. is a 57 y.o. male with a hx of HFrEF, cocaine use disorder, DM2, HTN, tobacco use, hx of prior stroke, MDD who is being admitted to the hospitalist team for acute hypoxic respiratory failure concern for decompensated heart failure.  Reports his symptoms began this morning acute onset shortness of breath.  He reports that shortness of breath is better since he came to the ED. Denies any chest pain or chest pressure, pain anywhere else.  Reports he has been taking his heart failure medications up until Thursday.  Endorses cocaine use over the past 2 days with the last use yesterday.  He is unable to tell me which medications he takes.  He is unable to tell me his baseline weight or if he has significant fluid retention.  He reports this has happened in the past but cannot tell me what the underlying etiology of this issue.  He is unable to provide much history.   Past Medical History:  Diagnosis Date   Acute combined systolic and diastolic CHF, NYHA class 3 (HCC)    a. 02/2014 Echo: EF 25-30%.   Blindness of right eye    Cardiomyopathy (HCC)    a. 02/2014 Echo: EF 25-30%, sev glob HK with inferolat HK->AK, mod conc LVH, Gr 2 DD, Mild MR, sev dil LA.   Depression    DM (diabetes mellitus) (HCC)    High cholesterol    Hypertension    Morbid obesity (HCC)    Tobacco abuse     Past Surgical History:  Procedure Laterality Date   CATARACT  EXTRACTION Right    LEFT HEART CATHETERIZATION WITH CORONARY ANGIOGRAM N/A 03/04/2014   Procedure: LEFT HEART CATHETERIZATION WITH CORONARY ANGIOGRAM;  Surgeon: Kathleene Hazel, MD;  Location: [facility] CATH LAB;  Service: Cardiovascular;  Laterality: N/A;   TEE WITHOUT CARDIOVERSION N/A 02/04/2017   Procedure: TRANSESOPHAGEAL ECHOCARDIOGRAM (TEE);  Surgeon: Lars Masson, MD;  Location: [facility] ENDOSCOPY;  Service: Cardiovascular;  Laterality: N/A;       Inpatient Medications: Scheduled Meds:  aspirin EC  81 mg Oral Daily   atorvastatin  80 mg Oral Daily   enoxaparin (LOVENOX) injection  40 mg Subcutaneous Q24H   ezetimibe  10 mg Oral Daily   [START ON 11/17/2023] furosemide  80 mg Intravenous Daily   sertraline  100 mg Oral Daily   spironolactone  25 mg Oral Daily   Continuous Infusions:  nitroGLYCERIN 120 mcg/min (11/16/23 1931)   PRN Meds: acetaminophen **OR** acetaminophen  Allergies:   No Known Allergies  Social History:   Social History   Socioeconomic History   Marital status: Widowed    Spouse name: Not on file   Number of children: 2   Years of education: 14   Highest education level: Not on file  Occupational History   Occupation: [employer]  Tobacco Use   Smoking status: Former    Types:  Cigarettes   Smokeless tobacco: Former    Quit date: 11/14/2020  Vaping Use   Vaping status: Never Used  Substance and Sexual Activity   Alcohol use: No   Drug use: No   Sexual activity: Yes  Other Topics Concern   Not on file  Social History Narrative   Lives with 2 young children in [place].  Wife died in 2020-04-30.  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   Left-handed   Caffeine: occasional tea   Social Determinants of Health   Financial Resource Strain: High Risk (01/24/2021)   Overall Financial Resource Strain (CARDIA)    Difficulty of Paying Living Expenses: Very hard  Food Insecurity: Food Insecurity Present (01/24/2021)   Hunger Vital  Sign    Worried About Running Out of Food in the Last Year: Sometimes true    Ran Out of Food in the Last Year: Sometimes true  Transportation Needs: Unmet Transportation Needs (01/24/2021)   PRAPARE - Administrator, Civil Service (Medical): Yes    Lack of Transportation (Non-Medical): Yes  Physical Activity: Not on file  Stress: Stress Concern Present (01/24/2021)   [employer] of Occupational Health - Occupational Stress Questionnaire    Feeling of Stress : Very much  Social Connections: Unknown (05/01/2022)   Received from [facility], [facility]   Social Network    Social Network: Not on file  Intimate Partner Violence: Unknown (03/28/2022)   Received from [facility], [facility]   HITS    Physically Hurt: Not on file    Insult or Talk Down To: Not on file    Threaten Physical Harm: Not on file    Scream or Curse: Not on file    Family History:    Family History  Problem Relation Age of Onset   Heart attack Father        died @ 76   Cervical cancer Mother        died @ 22     ROS:  Please see the history of present illness.  All other ROS reviewed and negative.     Physical Exam/Data:   Vitals:   11/16/23 1845 11/16/23 1915 11/16/23 1930 11/16/23 1935  BP: (!) 169/116 (!) 195/124 (!) 190/130 (!) 184/126  Pulse: 93 89 91 92  Resp: 17 20 (!) 26 (!) 25  SpO2: 99% 100% 98% 98%    Intake/Output Summary (Last 24 hours) at 11/16/2023 1948 Last data filed at 11/16/2023 1806 Gross per 24 hour  Intake --  Output 1600 ml  Net -1600 ml      11/13/2023    2:08 PM 11/04/2023    2:54 PM 08/05/2023    3:52 PM  Last 3 Weights  Weight (lbs) 177 lb 3.2 oz 178 lb 192 lb 10.9 oz  Weight (kg) 80.377 kg 80.74 kg 87.4 kg     There is no height or weight on file to calculate BMI.  General:  appears uncomfortable HEENT: normal Neck: no JVD Vascular: No carotid bruits; Distal pulses 2+ bilaterally, WWP Cardiac:  normal S1, S2; RRR; no murmur Lungs:   clear to auscultation bilaterally, no wheezing, rhonchi or rales  Abd: soft, nontender, no hepatomegaly  Ext: no edema Musculoskeletal:  No deformities, BUE and BLE strength normal and equal Skin: warm and dry   EKG:  The EKG was personally reviewed and demonstrates:  sinus tachycardia  Relevant CV Studies: TTE 06/2023: 1. Left ventricular ejection  fraction, by estimation, is 25 to 30%. The  left ventricle has severely decreased function. The left ventricle  demonstrates global hypokinesis. The left ventricular internal cavity size  was moderately dilated. There is mild  left ventricular hypertrophy. Left ventricular diastolic parameters are  consistent with Grade I diastolic dysfunction (impaired relaxation).   2. Right ventricular systolic function is normal. The right ventricular  size is normal.   3. Left atrial size was mildly dilated.   4. A small pericardial effusion is present.   5. The mitral valve is normal in structure. Trivial mitral valve  regurgitation. No evidence of mitral stenosis.   6. The aortic valve is tricuspid. Aortic valve regurgitation is not  visualized. No aortic stenosis is present.   7. The inferior vena cava is normal in size with greater than 50%  respiratory variability, suggesting right atrial pressure of 3 mmHg.   Laboratory Data:  High Sensitivity Troponin:   Recent Labs  Lab 11/16/23 1520 11/16/23 1819  TROPONINIHS 34* 37*     Chemistry Recent Labs  Lab 11/13/23 1454 11/16/23 1451 11/16/23 1520 11/16/23 1526  NA 139 142 138 141  K 3.6 3.4* 3.6 3.6  CL 106  --  106 106  CO2 27  --  20*  --   GLUCOSE 75  --  142* 147*  BUN 15  --  14 15  CREATININE 1.51*  --  1.17 1.30*  CALCIUM 8.7*  --  8.9  --   GFRNONAA 54*  --  >60  --   ANIONGAP 6  --  12  --     No results for input(s): "PROT", "ALBUMIN", "AST", "ALT", "ALKPHOS", "BILITOT" in the last 168 hours. Lipids No results for input(s): "CHOL", "TRIG", "HDL", "LABVLDL", "LDLCALC",  "CHOLHDL" in the last 168 hours.  Hematology Recent Labs  Lab 11/13/23 1454 11/16/23 1451 11/16/23 1520 11/16/23 1526  WBC 5.6  --  8.9  --   RBC 4.49  --  5.16  --   HGB 12.9* 15.6 15.4 15.6  HCT 39.8 46.0 45.9 46.0  MCV 88.6  --  89.0  --   MCH 28.7  --  29.8  --   MCHC 32.4  --  33.6  --   RDW 12.8  --  12.9  --   PLT 248  --  234  --    Thyroid No results for input(s): "TSH", "FREET4" in the last 168 hours.  BNP Recent Labs  Lab 11/13/23 1454 11/16/23 1520  BNP 372.4* 788.9*    DDimer No results for input(s): "DDIMER" in the last 168 hours.   Radiology/Studies:  DG Chest Portable 1 View  Result Date: 11/16/2023 CLINICAL DATA:  Shortness of breath EXAM: PORTABLE CHEST 1 VIEW COMPARISON:  X-ray 08/05/2023. and older FINDINGS: Diffuse patchy parenchymal opacities throughout the right hemithorax. Multifocal infiltrates are possible. No pneumothorax or effusion. Normal cardiopericardial silhouette. Tortuous and ectatic aorta. Overlapping cardiac leads. The right inferior costophrenic angle is clipped off the edge of the film. Degenerative changes of the spine. IMPRESSION: Diffuse patchy parenchymal opacities throughout the right hemithorax. Multifocal infiltrates are possible. Recommend follow-up. Electronically Signed   By: Karen Kays M.D.   On: 11/16/2023 15:24     Assessment and Plan:   AHRF HTN Emergency Cocaine Use Disorder Hx of HFrEF (LVEF 25-30%) Presents with acute onset of shortness of breath that began this morning and cocaine use for the past 2 days.  Blood pressures were significantly elevated on admission,  patient was placed on BiPAP, given IV Lasix 80 mg and started on IV nitroglycerin drip.  Patient was taken off BiPAP transition to 3 L of nasal cannula. Patient reports he has been taking his heart failure medications up until 2 days ago.  On exam he is warm and well-perfused, no significant JVD, no appreciable murmurs on exam.  Troponins mildly elevated,  BNP 700s, chest x-ray with unilateral right sided multifocal infiltrates. Presentation seems most consistent with hypertensive emergency secondary to cocaine use with resultant pulmonary edema with possibly some infectious/inflammatory cause of unilateral multifocal infiltrates.  Recommend continuing blood pressure control with nitroglycerin IV and restarting his home GDMT, and broader workup of unilateral infiltrates. -obtain echocardiogram -continue IV diuresis with NN goal of 1 L  -start valsartan 20 mg BID (Cr 1.17), previously was on entresto but was stopped due to headache; ideally would be back on this medication or another ACE/ARB -hold coreg at this time -restart home spironolactone 25 mg  -did not tolerate farxiga   Type II MI secondary to hypertension and cocaine use Hx of CVA -continue aspirin, atorvastatin 80 mg, zetia 10 mg   Risk Assessment/Risk Scores:        New York Heart Association (NYHA) Functional Class NYHA Class II      For questions or updates, please contact [facility] Please consult [email or web address] for contact info under    Signed, Charolotte Eke, MD  11/16/2023 7:48 PM

## 2023-11-16 NOTE — ED Notes (Signed)
RN and Paramedic attempted to place an IV without success. Phlebotomy notified for lab work

## 2023-11-16 NOTE — Plan of Care (Signed)

## 2023-11-16 NOTE — Progress Notes (Signed)
TR[facility] night cross cover note:   I was notified by RN of the patient's episode of nausea/vomiting, associated nonbloody, nonbilious emesis.  I subsequently placed order for prn IV Zofran.     Newton Pigg, DO Hospitalist

## 2023-11-16 NOTE — ED Provider Notes (Addendum)
[employer] EMERGENCY DEPARTMENT AT [facility] Provider Note   CSN: [id number] Arrival date & time: 11/16/23  1428     History  Chief Complaint  Patient presents with   Shortness of Breath    Matthew [name]. is a 57 y.o. male.  Pt with a hx of CHF, HTN, DM, cardiomyopathy, cocaine abuse presents with respiratory distress.  He reports that he started having some shortness of breath today and it got worse throughout the day.  He was found by fire to have oxygen saturation in the 80s.  He was ultimately placed on nonrebreather.  He was given albuterol and Solu-Medrol as well as nitroglycerin and route.  He does report that he has been using cocaine.  He last used yesterday.  He indicates that he has not been taking his medications regularly.  He denies any fevers.  No productive cough.  No associated chest pain.  Last echo showed a EF of 25 to 30%.       Home Medications Prior to Admission medications   Medication Sig Start Date End Date Taking? Authorizing Provider  aspirin EC 81 MG tablet Take 1 tablet (81 mg total) by mouth daily. Swallow whole. 07/16/22   Mayers, Cari S, PA-C  atorvastatin (LIPITOR) 80 MG tablet Take 1 tablet (80 mg total) by mouth daily. 11/04/23   [name], MD  BIDIL 20-37.5 MG tablet Take 1 tablet by mouth 3 (three) times daily. 11/13/23   Alen Bleacher, NP  carvedilol (COREG) 6.25 MG tablet Take 1 tablet (6.25 mg total) by mouth 2 (two) times daily. 11/13/23   Alen Bleacher, NP  ezetimibe (ZETIA) 10 MG tablet Take 1 tablet (10 mg total) by mouth daily. 11/04/23   [name], MD  furosemide (LASIX) 40 MG tablet Take 1 tablet (40 mg total) by mouth daily. 11/13/23   Alen Bleacher, NP  gabapentin (NEURONTIN) 300 MG capsule Take 1 capsule (300 mg total) by mouth at bedtime. 11/04/23   [name], MD  hydrOXYzine (ATARAX) 25 MG tablet Take 1 tablet (25 mg total) by mouth 3 (three) times daily as needed for anxiety. 09/19/22   Mayers, Cari  S, PA-C  melatonin 5 MG TABS Take 1 tablet (5 mg total) by mouth at bedtime. 07/16/22   Mayers, Cari S, PA-C  metFORMIN (GLUCOPHAGE-XR) 500 MG 24 hr tablet Take 1 tablet (500 mg total) by mouth daily with breakfast. 11/04/23   [name], MD  sertraline (ZOLOFT) 100 MG tablet Take 1 tablet (100 mg total) by mouth daily. 11/04/23   [name], MD  spironolactone (ALDACTONE) 25 MG tablet Take 1 tablet (25 mg total) by mouth daily. 08/13/22   [name], MD  timolol (TIMOPTIC) 0.5 % ophthalmic solution Place 1 drop into both eyes daily after breakfast. 07/16/22   Mayers, Cari S, PA-C  lovastatin (MEVACOR) 20 MG tablet Take 2 tablets (40 mg total) by mouth at bedtime. 06/24/14 08/26/14  Doris Cheadle, MD      Allergies    Patient has no known allergies.    Review of Systems   Review of Systems  Constitutional:  Positive for fatigue. Negative for chills, diaphoresis and fever.  HENT:  Negative for congestion, rhinorrhea and sneezing.   Eyes: Negative.   Respiratory:  Positive for cough and shortness of breath. Negative for chest tightness.   Cardiovascular:  Negative for chest pain and leg swelling.  Gastrointestinal:  Negative for abdominal pain, blood in stool, diarrhea, nausea  and vomiting.  Genitourinary:  Negative for difficulty urinating, flank pain, frequency and hematuria.  Musculoskeletal:  Negative for arthralgias and back pain.  Skin:  Negative for rash.  Neurological:  Negative for dizziness, speech difficulty, weakness, numbness and headaches.    Physical Exam Updated Vital Signs BP (!) 126/97   Pulse 98   Resp 14   SpO2 100%  Physical Exam Constitutional:      General: He is in acute distress.     Appearance: He is well-developed.  HENT:     Head: Normocephalic and atraumatic.  Eyes:     Pupils: Pupils are equal, round, and reactive to light.  Cardiovascular:     Rate and Rhythm: Normal rate and regular rhythm.     Heart sounds: Normal heart sounds.   Pulmonary:     Effort: Pulmonary effort is normal. No respiratory distress.     Breath sounds: Rales present. No wheezing.  Chest:     Chest wall: No tenderness.  Abdominal:     General: Bowel sounds are normal.     Palpations: Abdomen is soft.     Tenderness: There is no abdominal tenderness. There is no guarding or rebound.  Musculoskeletal:        General: Normal range of motion.     Cervical back: Normal range of motion and neck supple.     Comments: Trace edema to the lower extremities bilaterally  Lymphadenopathy:     Cervical: No cervical adenopathy.  Skin:    General: Skin is warm and dry.     Findings: No rash.  Neurological:     Mental Status: He is alert and oriented to person, place, and time.     ED Results / Procedures / Treatments   Labs (all labs ordered are listed, but only abnormal results are displayed) Labs Reviewed  I-STAT ARTERIAL BLOOD GAS, ED - Abnormal; Notable for the following components:      Result Value   pO2, Arterial 112 (*)    Potassium 3.4 (*)    All other components within normal limits  BASIC METABOLIC PANEL  CBC WITH DIFFERENTIAL/PLATELET  BRAIN NATRIURETIC PEPTIDE  ETHANOL  RAPID URINE DRUG SCREEN, HOSP PERFORMED  I-STAT CHEM 8, ED  I-STAT CHEM 8, ED  I-STAT CG4 LACTIC ACID, ED  TROPONIN I (HIGH SENSITIVITY)    EKG EKG Interpretation Date/Time:  Saturday November 16 2023 15:02:10 EST Ventricular Rate:  102 PR Interval:  134 QRS Duration:  103 QT Interval:  352 QTC Calculation: 459 R Axis:   88  Text Interpretation: Sinus tachycardia Biatrial enlargement LVH with secondary repolarization abnormality Confirmed by Rolan Bucco [phone]) on 11/16/2023 3:04:25 PM  Radiology No results found.  Procedures Procedures    Medications Ordered in ED Medications  nitroGLYCERIN 50 mg in dextrose 5 % 250 mL (0.2 mg/mL) infusion (200 mcg/min Intravenous New Bag/Given 11/16/23 1500)  furosemide (LASIX) injection 80 mg (80 mg  Intravenous Given 11/16/23 1445)  nitroGLYCERIN 50 mg in dextrose 5 % 250 mL (0.2 mg/mL) infusion (1,000 mcg/min Intravenous New Bag/Given 11/16/23 1501)    ED Course/ Medical Decision Making/ A&P                                 Medical Decision Making Amount and/or Complexity of Data Reviewed Labs: ordered. Radiology: ordered.  Risk Prescription drug management.   Patient is a 57 year old male who presents in respiratory distress.  He  was placed on BiPAP on arrival.  He has notable rales on exam.  He only has trace edema but overall has a picture of heart failure.  He has recently been doing cocaine.  He was started on high-dose IV nitroglycerin and given dose of Lasix.  He seems to be improving my reexam.  His work of breathing has improved.  His blood pressure has improved.  Chest x-ray was interpreted by me does show diffuse pulmonary edema although it does appear much more prominent on the right lung.  Labs show mild elevation in his creatinine although in chart review, looks similar to his prior values.  I asked him if he had any coughing or productive cough and he does say yes.  He does not really elaborate.  He is not answering questions well.  He does not report any fevers.  Will check a BMP to help differentiate CHF exacerbation versus pneumonia.  Patient will need to be admitted once remainder of the labs come back.  Care turned over to Dr. Elayne Snare.  CRITICAL CARE Performed by: Rolan Bucco Total critical care time: 30 minutes Critical care time was exclusive of separately billable procedures and treating other patients. Critical care was necessary to treat or prevent imminent or life-threatening deterioration. Critical care was time spent personally by me on the following activities: development of treatment plan with patient and/or surrogate as well as nursing, discussions with consultants, evaluation of patient's response to treatment, examination of patient, obtaining history  from patient or surrogate, ordering and performing treatments and interventions, ordering and review of laboratory studies, ordering and review of radiographic studies, pulse oximetry and re-evaluation of patient's condition.   Final Clinical Impression(s) / ED Diagnoses Final diagnoses:  Acute on chronic respiratory failure with hypoxia [facility])    Rx / DC Orders ED Discharge Orders     None         Rolan Bucco, MD 11/16/23 1519    Rolan Bucco, MD 11/16/23 1534    Rolan Bucco, MD 11/16/23 1606

## 2023-11-16 NOTE — ED Notes (Signed)
Pt denies pain in chest and abdomen

## 2023-11-16 NOTE — ED Provider Notes (Signed)
  Physical Exam  BP (!) 183/118   Pulse 91   Resp (!) 28   SpO2 97%   Physical Exam Vitals and nursing note reviewed.  HENT:     Head: Normocephalic and atraumatic.  Eyes:     Pupils: Pupils are equal, round, and reactive to light.  Cardiovascular:     Rate and Rhythm: Normal rate and regular rhythm.  Pulmonary:     Effort: Pulmonary effort is normal.     Breath sounds: Normal breath sounds.  Abdominal:     Palpations: Abdomen is soft.     Tenderness: There is no abdominal tenderness.  Skin:    General: Skin is warm and dry.  Neurological:     Mental Status: He is alert.  Psychiatric:        Mood and Affect: Mood normal.     Procedures  .Critical Care  Performed by: Royanne Foots, DO Authorized by: Royanne Foots, DO   Critical care provider statement:    Critical care time (minutes):  40   Critical care was necessary to treat or prevent imminent or life-threatening deterioration of the following conditions:  Respiratory failure   Critical care was time spent personally by me on the following activities:  Development of treatment plan with patient or surrogate, discussions with consultants, evaluation of patient's response to treatment, examination of patient, ordering and review of laboratory studies, ordering and review of radiographic studies, ordering and performing treatments and interventions, pulse oximetry, re-evaluation of patient's condition and review of old charts   I assumed direction of critical care for this patient from another provider in my specialty: no     Care discussed with: admitting provider     ED Course / MDM   Clinical Course as of 11/16/23 1759  Sat Nov 16, 2023  1757 No significant leukocytosis on laboratory workup.  More likely pulmonary edema in the setting of acute heart failure and less likely acute infiltrate.  UDS positive for cocaine.  BNP of 788 significantly elevated from baseline in the 300s.  No elevation of venous lactate.   Will obtain delta troponin.  Patient has remove the BiPAP mask stable on 3 L nasal cannula.  He will be admitted to medicine [MP]    Clinical Course User Index [MP] Royanne Foots, DO   Medical Decision Making I, Estelle June DO, have assumed care of this patient from the previous provider pending remainder of laboratory workup, reevaluation and admission  Amount and/or Complexity of Data Reviewed Labs: ordered. Radiology: ordered.  Risk Prescription drug management. Decision regarding hospitalization.   Final diagnosis Acute on chronic respiratory failure with hypoxia Acute pulmonary tumor Cocaine use Acute on chronic heart failure with preserved ejection fraction       Royanne Foots, DO 11/16/23 1759

## 2023-11-16 NOTE — ED Notes (Signed)
Pt pulled off Cpap mask and stated he doesn't want it on. RN called RT to assess pt.

## 2023-11-16 NOTE — ED Triage Notes (Signed)
Pt bib ems from c/o SOB Fire noted O2 80's RA. Pt placed on 12L O2 90's then transferred to 15L 92%. Pt placed on CPAP Pt was given 5 mg Albuterol 125 mg Solumedrol 0.4 Nitro  Pt has been abusing crack cocaine for two days Hx CHF  Respiratory grunting while in route.   RR 28 HR 100-110

## 2023-11-16 NOTE — ED Notes (Addendum)
Patient took himself off of CPAP due to having to urinate. MD, RN, and RT were notified. MD requested patient to be placed onto nasal cannula.

## 2023-11-16 NOTE — ED Notes (Signed)
RT at bedside.

## 2023-11-16 NOTE — ED Notes (Signed)
ED TO INPATIENT HANDOFF REPORT  ED Nurse Name and Phone #: Trish Mage [phone]  S Name/Age/Gender Matthew [name] 57 y.o. male Room/Bed: TRACC/TRACC  Code Status   Code Status: Full Code  Home/SNF/Other Home Patient oriented to: self, place, time, and situation Is this baseline? Yes   Triage Complete: Triage complete  Chief Complaint Acute on chronic hypoxic respiratory failure (HCC) [J96.21]  Triage Note Pt bib ems from c/o SOB Fire noted O2 80's RA. Pt placed on 12L O2 90's then transferred to 15L 92%. Pt placed on CPAP Pt was given 5 mg Albuterol 125 mg Solumedrol 0.4 Nitro  Pt has been abusing crack cocaine for two days Hx CHF  Respiratory grunting while in route.   RR 28 HR 100-110     Allergies No Known Allergies  Level of Care/Admitting Diagnosis ED Disposition     ED Disposition  Admit   Condition  --   Comment  Hospital Area: [facility] [[id number]]  Level of Care: Progressive [102]  Admit to Progressive based on following criteria: CARDIOVASCULAR & THORACIC of moderate stability with acute coronary syndrome symptoms/low risk myocardial infarction/hypertensive urgency/arrhythmias/heart failure potentially compromising stability and stable post cardiovascular intervention patients.  Admit to Progressive based on following criteria: RESPIRATORY PROBLEMS hypoxemic/hypercapnic respiratory failure that is responsive to NIPPV (BiPAP) or High Flow Nasal Cannula (6-80 lpm). Frequent assessment/intervention, no > Q2 hrs < Q4 hrs, to maintain oxygenation and pulmonary hygiene.  May admit patient to [facility] or [facility] if equivalent level of care is available:: No  Covid Evaluation: Asymptomatic - no recent exposure (last 10 days) testing not required  Diagnosis: Acute on chronic hypoxic respiratory failure [facility]) [[id number]]  Admitting Physician: Briscoe Burns [[id number]]  Attending Physician: Briscoe Burns [[id number]]  Certification:: I  certify this patient will need inpatient services for at least 2 midnights  Expected Medical Readiness: 11/18/2023          B Medical/Surgery History Past Medical History:  Diagnosis Date   Acute combined systolic and diastolic CHF, NYHA class 3 (HCC)    a. 02/2014 Echo: EF 25-30%.   Blindness of right eye    Cardiomyopathy (HCC)    a. 02/2014 Echo: EF 25-30%, sev glob HK with inferolat HK->AK, mod conc LVH, Gr 2 DD, Mild MR, sev dil LA.   Depression    DM (diabetes mellitus) (HCC)    High cholesterol    Hypertension    Morbid obesity (HCC)    Tobacco abuse    Past Surgical History:  Procedure Laterality Date   CATARACT EXTRACTION Right    LEFT HEART CATHETERIZATION WITH CORONARY ANGIOGRAM N/A 03/04/2014   Procedure: LEFT HEART CATHETERIZATION WITH CORONARY ANGIOGRAM;  Surgeon: Kathleene Hazel, MD;  Location: [facility] CATH LAB;  Service: Cardiovascular;  Laterality: N/A;   TEE WITHOUT CARDIOVERSION N/A 02/04/2017   Procedure: TRANSESOPHAGEAL ECHOCARDIOGRAM (TEE);  Surgeon: Lars Masson, MD;  Location: [facility] ENDOSCOPY;  Service: Cardiovascular;  Laterality: N/A;     A IV Location/Drains/Wounds Patient Lines/Drains/Airways Status     Active Line/Drains/Airways     Name Placement date Placement time Site Days   Peripheral IV 11/16/23 18 G Left Antecubital 11/16/23  1439  Antecubital  less than 1   Peripheral IV 11/16/23 20 G Anterior;Left Forearm 11/16/23  1523  Forearm  less than 1            Intake/Output Last 24 hours  Intake/Output Summary (Last 24 hours) at 11/16/2023 1910  Last data filed at 11/16/2023 1806 Gross per 24 hour  Intake --  Output 1600 ml  Net -1600 ml    Labs/Imaging Results for orders placed or performed during the hospital encounter of 11/16/23 (from the past 48 hour(s))  I-Stat arterial blood gas, ED ([facility] ED, [facility], [facility])     Status: Abnormal   Collection Time: 11/16/23  2:51 PM  Result Value Ref Range   pH, Arterial 7.395 7.35 - 7.45    pCO2 arterial 39.4 32 - 48 mmHg   pO2, Arterial 112 (H) 83 - 108 mmHg   Bicarbonate 24.1 20.0 - 28.0 mmol/L   TCO2 25 22 - 32 mmol/L   O2 Saturation 98 %   Acid-base deficit 1.0 0.0 - 2.0 mmol/L   Sodium 142 135 - 145 mmol/L   Potassium 3.4 (L) 3.5 - 5.1 mmol/L   Calcium, Ion 1.26 1.15 - 1.40 mmol/L   HCT 46.0 39.0 - 52.0 %   Hemoglobin 15.6 13.0 - 17.0 g/dL   Patient temperature 78.2 F    Collection site RADIAL, ALLEN'S TEST ACCEPTABLE    Drawn by RT    Sample type ARTERIAL   Urine rapid drug screen (hosp performed)     Status: Abnormal   Collection Time: 11/16/23  3:01 PM  Result Value Ref Range   Opiates NONE DETECTED NONE DETECTED   Cocaine POSITIVE (A) NONE DETECTED   Benzodiazepines NONE DETECTED NONE DETECTED   Amphetamines NONE DETECTED NONE DETECTED   Tetrahydrocannabinol NONE DETECTED NONE DETECTED   Barbiturates NONE DETECTED NONE DETECTED    Comment: (NOTE) DRUG SCREEN FOR MEDICAL PURPOSES ONLY.  IF CONFIRMATION IS NEEDED FOR ANY PURPOSE, NOTIFY LAB WITHIN 5 DAYS.  LOWEST DETECTABLE LIMITS FOR URINE DRUG SCREEN Drug Class                     Cutoff (ng/mL) Amphetamine and metabolites    1000 Barbiturate and metabolites    200 Benzodiazepine                 200 Opiates and metabolites        300 Cocaine and metabolites        300 THC                            50 Performed at [facility], [address], [place], [place] [address]   Basic metabolic panel     Status: Abnormal   Collection Time: 11/16/23  3:20 PM  Result Value Ref Range   Sodium 138 135 - 145 mmol/L   Potassium 3.6 3.5 - 5.1 mmol/L   Chloride 106 98 - 111 mmol/L   CO2 20 (L) 22 - 32 mmol/L   Glucose, Bld 142 (H) 70 - 99 mg/dL    Comment: Glucose reference range applies only to samples taken after fasting for at least 8 hours.   BUN 14 6 - 20 mg/dL   Creatinine, Ser 3.08 0.61 - 1.24 mg/dL   Calcium 8.9 8.9 - 65.7 mg/dL   GFR, Estimated >84 >69 mL/min    Comment: (NOTE) Calculated  using the CKD-EPI Creatinine Equation (2021)    Anion gap 12 5 - 15    Comment: Performed at [facility], [address], [place], [place] [address]  CBC with Differential     Status: Abnormal   Collection Time: 11/16/23  3:20 PM  Result Value Ref Range  WBC 8.9 4.0 - 10.5 K/uL   RBC 5.16 4.22 - 5.81 MIL/uL   Hemoglobin 15.4 13.0 - 17.0 g/dL   HCT 91.4 78.2 - 95.6 %   MCV 89.0 80.0 - 100.0 fL   MCH 29.8 26.0 - 34.0 pg   MCHC 33.6 30.0 - 36.0 g/dL   RDW 21.3 08.6 - 57.8 %   Platelets 234 150 - 400 K/uL   nRBC 0.0 0.0 - 0.2 %   Neutrophils Relative % 92 %   Neutro Abs 8.2 (H) 1.7 - 7.7 K/uL   Lymphocytes Relative 3 %   Lymphs Abs 0.3 (L) 0.7 - 4.0 K/uL   Monocytes Relative 5 %   Monocytes Absolute 0.4 0.1 - 1.0 K/uL   Eosinophils Relative 0 %   Eosinophils Absolute 0.0 0.0 - 0.5 K/uL   Basophils Relative 0 %   Basophils Absolute 0.0 0.0 - 0.1 K/uL   Immature Granulocytes 0 %   Abs Immature Granulocytes 0.02 0.00 - 0.07 K/uL    Comment: Performed at [facility], [address], [place], [place] [address]  Troponin I (High Sensitivity)     Status: Abnormal   Collection Time: 11/16/23  3:20 PM  Result Value Ref Range   Troponin I (High Sensitivity) 34 (H) <18 ng/L    Comment: (NOTE) Elevated high sensitivity troponin I (hsTnI) values and significant  changes across serial measurements may suggest ACS but many other  chronic and acute conditions are known to elevate hsTnI results.  Refer to the "Links" section for chest pain algorithms and additional  guidance. Performed at [facility], [address] [place], [place] [address]   Brain natriuretic peptide     Status: Abnormal   Collection Time: 11/16/23  3:20 PM  Result Value Ref Range   B Natriuretic Peptide 788.9 (H) 0.0 - 100.0 pg/mL    Comment: Performed at [facility], [address] [place], [place] [address]  Ethanol     Status: None   Collection Time: 11/16/23  3:20 PM  Result Value  Ref Range   Alcohol, Ethyl (B) <10 <10 mg/dL    Comment: (NOTE) Lowest detectable limit for serum alcohol is 10 mg/dL.  For medical purposes only. Performed at [facility], [address] [place], [place] [address]   I-stat chem 8, ED (not at [facility], [facility] or [facility])     Status: Abnormal   Collection Time: 11/16/23  3:26 PM  Result Value Ref Range   Sodium 141 135 - 145 mmol/L   Potassium 3.6 3.5 - 5.1 mmol/L   Chloride 106 98 - 111 mmol/L   BUN 15 6 - 20 mg/dL   Creatinine, Ser 2.53 (H) 0.61 - 1.24 mg/dL   Glucose, Bld 664 (H) 70 - 99 mg/dL    Comment: Glucose reference range applies only to samples taken after fasting for at least 8 hours.   Calcium, Ion 1.09 (L) 1.15 - 1.40 mmol/L   TCO2 21 (L) 22 - 32 mmol/L   Hemoglobin 15.6 13.0 - 17.0 g/dL   HCT 40.3 47.4 - 25.9 %  I-Stat Lactic Acid     Status: None   Collection Time: 11/16/23  3:26 PM  Result Value Ref Range   Lactic Acid, Venous 1.7 0.5 - 1.9 mmol/L  Troponin I (High Sensitivity)     Status: Abnormal   Collection Time: 11/16/23  6:19 PM  Result Value Ref Range   Troponin I (High Sensitivity)  37 (H) <18 ng/L    Comment: (NOTE) Elevated high sensitivity troponin I (hsTnI) values and significant  changes across serial measurements may suggest ACS but many other  chronic and acute conditions are known to elevate hsTnI results.  Refer to the "Links" section for chest pain algorithms and additional  guidance. Performed at [facility], [address] [place], [place] [address]   I-Stat Lactic Acid     Status: Abnormal   Collection Time: 11/16/23  6:22 PM  Result Value Ref Range   Lactic Acid, Venous 2.0 (HH) 0.5 - 1.9 mmol/L   Comment NOTIFIED PHYSICIAN    DG Chest Portable 1 View  Result Date: 11/16/2023 CLINICAL DATA:  Shortness of breath EXAM: PORTABLE CHEST 1 VIEW COMPARISON:  X-ray 08/05/2023. and older FINDINGS: Diffuse patchy parenchymal opacities throughout the right hemithorax. Multifocal  infiltrates are possible. No pneumothorax or effusion. Normal cardiopericardial silhouette. Tortuous and ectatic aorta. Overlapping cardiac leads. The right inferior costophrenic angle is clipped off the edge of the film. Degenerative changes of the spine. IMPRESSION: Diffuse patchy parenchymal opacities throughout the right hemithorax. Multifocal infiltrates are possible. Recommend follow-up. Electronically Signed   By: Karen Kays M.D.   On: 11/16/2023 15:24    Pending Labs Unresulted Labs (From admission, onward)     Start     Ordered   11/23/23 0500  Creatinine, serum  (enoxaparin (LOVENOX)    CrCl >/= 30 ml/min)  Weekly,   R     Comments: while on enoxaparin therapy    11/16/23 1900   11/17/23 0500  CBC  Tomorrow morning,   R        11/16/23 1900   11/17/23 0500  Basic metabolic panel  Tomorrow morning,   R        11/16/23 1900   11/16/23 2030  Lactic acid, plasma  (Lactic Acid)  Once,   R        11/16/23 1908   11/16/23 1900  TSH  Once,   R        11/16/23 1900   11/16/23 1858  HIV Antibody (routine testing w rflx)  (HIV Antibody (Routine testing w reflex) panel)  Once,   R        11/16/23 1900            Vitals/Pain Today's Vitals   11/16/23 1650 11/16/23 1715 11/16/23 1830 11/16/23 1845  BP: (!) 182/119 (!) 183/118 (!) 163/114 (!) 169/116  Pulse: 93 91 92 93  Resp: (!) 31 (!) 28 (!) 29 17  SpO2: 96% 97% 99% 99%  PainSc:        Isolation Precautions No active isolations  Medications Medications  nitroGLYCERIN 50 mg in dextrose 5 % 250 mL (0.2 mg/mL) infusion (100 mcg/min Intravenous New Bag/Given 11/16/23 1804)  enoxaparin (LOVENOX) injection 40 mg (has no administration in time range)  acetaminophen (TYLENOL) tablet 650 mg (has no administration in time range)    Or  acetaminophen (TYLENOL) suppository 650 mg (has no administration in time range)  atorvastatin (LIPITOR) tablet 80 mg (has no administration in time range)  carvedilol (COREG) tablet 6.25 mg (has  no administration in time range)  ezetimibe (ZETIA) tablet 10 mg (has no administration in time range)  sertraline (ZOLOFT) tablet 100 mg (has no administration in time range)  spironolactone (ALDACTONE) tablet 25 mg (has no administration in time range)  aspirin EC tablet 81 mg (has no administration in time range)  furosemide (LASIX) injection 80 mg (  80 mg Intravenous Given 11/16/23 1445)  nitroGLYCERIN 50 mg in dextrose 5 % 250 mL (0.2 mg/mL) infusion (0 mcg/min Intravenous Stopped 11/16/23 1607)    Mobility walks     Focused Assessments    R Recommendations: See Admitting Provider Note  Report given to:   Additional Notes:

## 2023-11-17 ENCOUNTER — Inpatient Hospital Stay (HOSPITAL_COMMUNITY): Payer: Medicare Other

## 2023-11-17 ENCOUNTER — Encounter (HOSPITAL_COMMUNITY): Payer: Self-pay | Admitting: Student

## 2023-11-17 DIAGNOSIS — I5033 Acute on chronic diastolic (congestive) heart failure: Secondary | ICD-10-CM

## 2023-11-17 DIAGNOSIS — J9601 Acute respiratory failure with hypoxia: Secondary | ICD-10-CM

## 2023-11-17 DIAGNOSIS — J9621 Acute and chronic respiratory failure with hypoxia: Secondary | ICD-10-CM

## 2023-11-17 DIAGNOSIS — J810 Acute pulmonary edema: Secondary | ICD-10-CM | POA: Diagnosis not present

## 2023-11-17 DIAGNOSIS — I5021 Acute systolic (congestive) heart failure: Secondary | ICD-10-CM

## 2023-11-17 LAB — BASIC METABOLIC PANEL
Anion gap: 11 (ref 5–15)
BUN: 24 mg/dL — ABNORMAL HIGH (ref 6–20)
CO2: 24 mmol/L (ref 22–32)
Calcium: 9.2 mg/dL (ref 8.9–10.3)
Chloride: 103 mmol/L (ref 98–111)
Creatinine, Ser: 1.64 mg/dL — ABNORMAL HIGH (ref 0.61–1.24)
GFR, Estimated: 49 mL/min — ABNORMAL LOW (ref >60)
Glucose, Bld: 157 mg/dL — ABNORMAL HIGH (ref 70–99)
Potassium: 3.7 mmol/L (ref 3.5–5.1)
Sodium: 138 mmol/L (ref 135–145)

## 2023-11-17 LAB — ECHOCARDIOGRAM COMPLETE
Area-P 1/2: 3.72 cm2
Height: 69 in
S' Lateral: 6.2 cm
Single Plane A4C EF: 19 %
Weight: 2589.08 [oz_av]

## 2023-11-17 LAB — HEMOGLOBIN A1C
Hgb A1c MFr Bld: 6.2 % — ABNORMAL HIGH (ref 4.8–5.6)
Mean Plasma Glucose: 131.24 mg/dL

## 2023-11-17 LAB — URINALYSIS, W/ REFLEX TO CULTURE (INFECTION SUSPECTED)
Bacteria, UA: NONE SEEN
Bilirubin Urine: NEGATIVE
Glucose, UA: NEGATIVE mg/dL
Hgb urine dipstick: NEGATIVE
Ketones, ur: NEGATIVE mg/dL
Leukocytes,Ua: NEGATIVE
Nitrite: NEGATIVE
Protein, ur: 30 mg/dL — AB
Specific Gravity, Urine: 1.014 (ref 1.005–1.030)
pH: 5 (ref 5.0–8.0)

## 2023-11-17 LAB — CBC
HCT: 46.2 % (ref 39.0–52.0)
Hemoglobin: 15.8 g/dL (ref 13.0–17.0)
MCH: 28.9 pg (ref 26.0–34.0)
MCHC: 34.2 g/dL (ref 30.0–36.0)
MCV: 84.6 fL (ref 80.0–100.0)
Platelets: 287 10*3/uL (ref 150–400)
RBC: 5.46 MIL/uL (ref 4.22–5.81)
RDW: 13 % (ref 11.5–15.5)
WBC: 17.1 10*3/uL — ABNORMAL HIGH (ref 4.0–10.5)
nRBC: 0 % (ref 0.0–0.2)

## 2023-11-17 LAB — LACTIC ACID, PLASMA: Lactic Acid, Venous: 1.2 mmol/L (ref 0.5–1.9)

## 2023-11-17 LAB — GLUCOSE, CAPILLARY
Glucose-Capillary: 137 mg/dL — ABNORMAL HIGH (ref 70–99)
Glucose-Capillary: 145 mg/dL — ABNORMAL HIGH (ref 70–99)

## 2023-11-17 MED ORDER — ISOSORB DINITRATE-HYDRALAZINE 20-37.5 MG PO TABS
1.0000 | ORAL_TABLET | Freq: Two times a day (BID) | ORAL | Status: DC
  Administered 2023-11-17 – 2023-11-18 (×3): 1 via ORAL
  Filled 2023-11-17 (×3): qty 1

## 2023-11-17 MED ORDER — INSULIN ASPART 100 UNIT/ML IJ SOLN
0.0000 [IU] | Freq: Three times a day (TID) | INTRAMUSCULAR | Status: DC
Start: 2023-11-18 — End: 2023-11-20
  Administered 2023-11-18: 2 [IU] via SUBCUTANEOUS

## 2023-11-17 MED ORDER — AZITHROMYCIN 250 MG PO TABS
500.0000 mg | ORAL_TABLET | ORAL | Status: DC
  Administered 2023-11-17 – 2023-11-19 (×3): 500 mg via ORAL
  Filled 2023-11-17 (×3): qty 2

## 2023-11-17 MED ORDER — CEFTRIAXONE SODIUM 2 G IJ SOLR
2.0000 g | INTRAMUSCULAR | Status: DC
  Administered 2023-11-17 – 2023-11-19 (×3): 2 g via INTRAVENOUS
  Filled 2023-11-17 (×4): qty 20

## 2023-11-17 MED ORDER — INSULIN ASPART 100 UNIT/ML IJ SOLN: 0.0000 [IU] | Freq: Every day | INTRAMUSCULAR | Status: DC

## 2023-11-17 NOTE — Progress Notes (Signed)
  Echocardiogram 2D Echocardiogram has been performed.  Delcie Roch 11/17/2023, 11:09 AM

## 2023-11-17 NOTE — Plan of Care (Signed)
  Problem: Clinical Measurements: Goal: Will remain free from infection Outcome: Not Met (add Reason) Note: WBC elevated. Afebrile. Goal: Diagnostic test results will improve Outcome: Not Met (add Reason) Note: WBC elevated.    Problem: Education: Goal: Ability to demonstrate management of disease process will improve Outcome: Progressing   Problem: Education: Goal: Knowledge of General Education information will improve Description: Including pain rating scale, medication(s)/side effects and non-pharmacologic comfort measures Outcome: Progressing   Problem: Health Behavior/Discharge Planning: Goal: Ability to manage health-related needs will improve Outcome: Progressing   Problem: Clinical Measurements: Goal: Ability to maintain clinical measurements within normal limits will improve Outcome: Progressing Goal: Respiratory complications will improve Outcome: Progressing Goal: Cardiovascular complication will be avoided Outcome: Progressing   Problem: Activity: Goal: Risk for activity intolerance will decrease Outcome: Progressing   Problem: Nutrition: Goal: Adequate nutrition will be maintained Outcome: Progressing   Problem: Coping: Goal: Level of anxiety will decrease Outcome: Progressing   Problem: Elimination: Goal: Will not experience complications related to bowel motility Outcome: Progressing Goal: Will not experience complications related to urinary retention Outcome: Progressing   Problem: Pain Management: Goal: General experience of comfort will improve Outcome: Progressing   Problem: Safety: Goal: Ability to remain free from injury will improve Outcome: Progressing   Problem: Skin Integrity: Goal: Risk for impaired skin integrity will decrease Outcome: Progressing   Problem: Education: Goal: Ability to demonstrate management of disease process will improve Outcome: Progressing Goal: Ability to verbalize understanding of medication therapies  will improve Outcome: Progressing Goal: Individualized Educational Video(s) Outcome: Progressing   Problem: Activity: Goal: Capacity to carry out activities will improve Outcome: Progressing   Problem: Cardiac: Goal: Ability to achieve and maintain adequate cardiopulmonary perfusion will improve Outcome: Progressing

## 2023-11-17 NOTE — Progress Notes (Signed)
Progress Note  Patient Name: Matthew [name]. Date of Encounter: 11/17/2023  Primary Cardiologist:DM     Patient Profile:    Matthew Bartschi. is a 57 y.o. male with a hx of HFrEF,(EF 2022 <20% >>5/22 50% 7/24 25-30% )  cocaine use disorder, DM2, HTN, tobacco use, hx of prior stroke, MDD who is being admitted to the hospitalist team for acute hypoxic respiratory failure W  for decompensated heart failure medicated with carvedilol, spironolactone, and hydralazine nitrates, irbesartan was started last night, Entresto not having been tolerated because of headaches.  Hydralazine/nitrates were put on hold   recent cocaine use (positive UDS) Says he ran out of meds "for some time"   Subjective   Still sob but better    Inpatient Medications    Scheduled Meds:  aspirin EC  81 mg Oral Daily   atorvastatin  80 mg Oral Daily   enoxaparin (LOVENOX) injection  40 mg Subcutaneous Q24H   ezetimibe  10 mg Oral Daily   furosemide  80 mg Intravenous Daily   irbesartan  37.5 mg Oral Daily   sertraline  100 mg Oral Daily   spironolactone  25 mg Oral Daily   Continuous Infusions:  nitroGLYCERIN Stopped (11/17/23 0422)   PRN Meds: acetaminophen **OR** acetaminophen, melatonin, ondansetron (ZOFRAN) IV   Vital Signs    Vitals:   11/17/23 0424 11/17/23 0500 11/17/23 0600 11/17/23 0829  BP: (!) 145/100   (!) 154/114  Pulse: 80 84 89 87  Resp: 16 16 16 16   Temp:    98 F (36.7 C)  TempSrc:    Oral  SpO2: 98% 99% 99% 97%  Weight:  73.4 kg    Height:        Intake/Output Summary (Last 24 hours) at 11/17/2023 1133 Last data filed at 11/17/2023 0838 Gross per 24 hour  Intake 836.8 ml  Output 2375 ml  Net -1538.2 ml   Filed Weights   11/16/23 2023 11/17/23 0500  Weight: 73.5 kg 73.4 kg    Telemetry    nsr - Personally Reviewed  ECG    Sinus LVH with repol no changes  - Personally Reviewed  Physical Exam    GEN: mild Resp distress, grunting w breathing  NeckJVP  about 8 Cardiac: RRR, no murmurs, rubs, or gallops.  Respiratory: Clear to auscultation bilaterally. GI: Soft, mild RUQ tenderness +HJR  MS: No edema; No deformity. Neuro:  Nonfocal  Psych: Normal affect   Labs    Chemistry Recent Labs  Lab 11/13/23 1454 11/16/23 1451 11/16/23 1520 11/16/23 1526  NA 139 142 138 141  K 3.6 3.4* 3.6 3.6  CL 106  --  106 106  CO2 27  --  20*  --   GLUCOSE 75  --  142* 147*  BUN 15  --  14 15  CREATININE 1.51*  --  1.17 1.30*  CALCIUM 8.7*  --  8.9  --   GFRNONAA 54*  --  >60  --   ANIONGAP 6  --  12  --      Hematology Recent Labs  Lab 11/13/23 1454 11/16/23 1451 11/16/23 1520 11/16/23 1526 11/17/23 1039  WBC 5.6  --  8.9  --  17.1*  RBC 4.49  --  5.16  --  5.46  HGB 12.9*   < > 15.4 15.6 15.8  HCT 39.8   < > 45.9 46.0 46.2  MCV 88.6  --  89.0  --  84.6  MCH  28.7  --  29.8  --  28.9  MCHC 32.4  --  33.6  --  34.2  RDW 12.8  --  12.9  --  13.0  PLT 248  --  234  --  287   < > = values in this interval not displayed.    Cardiac EnzymesNo results for input(s): "TROPONINI" in the last 168 hours. No results for input(s): "TROPIPOC" in the last 168 hours.   BNP Recent Labs  Lab 11/13/23 1454 11/16/23 1520  BNP 372.4* 788.9*     DDimer No results for input(s): "DDIMER" in the last 168 hours.   Radiology    DG Chest Portable 1 View  Result Date: 11/16/2023 CLINICAL DATA:  Shortness of breath EXAM: PORTABLE CHEST 1 VIEW COMPARISON:  X-ray 08/05/2023. and older FINDINGS: Diffuse patchy parenchymal opacities throughout the right hemithorax. Multifocal infiltrates are possible. No pneumothorax or effusion. Normal cardiopericardial silhouette. Tortuous and ectatic aorta. Overlapping cardiac leads. The right inferior costophrenic angle is clipped off the edge of the film. Degenerative changes of the spine. IMPRESSION: Diffuse patchy parenchymal opacities throughout the right hemithorax. Multifocal infiltrates are possible. Recommend  follow-up. Electronically Signed   By: Karen Kays M.D.   On: 11/16/2023 15:24    Cardiac Studies     Assessment & Plan    Nonischemic cardiomyopathy  HFrEF-acute/chronic  Cocaine use (again)  Hypertension  Leukocytosis  Event precipitated by not having medications for "some time"  had been supposed to pick up tomorrow Diuresis ongoing with intravenous Lasix. Continue Aldactone with intercurrent introduction of irbesartan.  Carvedilol on hold secondary to decompensated heart failure.  Not tolerated Entresto/SGLT2 in the past. With BP elevated will resume Bidil Elevated WBC without fever -- cocaine, different from other stimulants, is NOT assoc with leukocytosis-- will follow for fever  And repeat CXR as portable was concerning for asymmetric infiltrates    Issue was raised previously as to the role of the defibrillator.  A little hard decision in the setting of ongoing cocaine use, but he is very young.      For questions or updates, please contact [facility] Please consult [email or web address] for contact info under Cardiology/STEMI.      Signed, Sherryl Manges, MD  11/17/2023, 11:33 AM

## 2023-11-17 NOTE — Hospital Course (Signed)
57 y.o. 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   Patient is a difficult historian, unable to elicit much information from him.  He states that he suddenly became short of breath this morning and this did not resolve.  It became progressively worse.  States that this has happened before.  He does endorse cocaine use over the past 2 days, last use yesterday.  He states that he does take his home medications regularly, although unable to tell me which medications he is currently on.  He denies any headaches, vision changes, nausea, fevers, cough, chest pain, palpitations, abdominal pain, diarrhea, urinary changes.   First responders noted that patient had O2 sats in the 80s on room air.  They placed patient on nonrebreather and gave him albuterol, Solu-Medrol, nitroglycerin en route to [facility] ED.   ED course: Patient placed on BiPAP on arrival. CBC unremarkable.  BMP with bicarb 20, glucose 142, creatinine 1.17 (around his baseline).  BNP 789.  Negative for alcohol.  Urine drug screen positive for cocaine use.  Troponins 34 > 37. Lactic acid initially normal but still mildly elevated on recheck at 2.0.  I-STAT ABG unremarkable.  Chest x-ray showing likely pulmonary edema in R hemithorax.  Patient given IV Lasix and started on nitroglycerin drip per ED provider.  [facility] hospitalist asked to evaluate patient for admission.

## 2023-11-17 NOTE — Progress Notes (Signed)
Progress Note   Patient: Matthew [name]. XBM:[id number] DOB: [birth year] DOA: 11/16/2023     1 DOS: the patient was seen and examined on 11/17/2023   Brief hospital course:  57 y.o. 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   Patient is a difficult historian, unable to elicit much information from him.  He states that he suddenly became short of breath this morning and this did not resolve.  It became progressively worse.  States that this has happened before.  He does endorse cocaine use over the past 2 days, last use yesterday.  He states that he does take his home medications regularly, although unable to tell me which medications he is currently on.  He denies any headaches, vision changes, nausea, fevers, cough, chest pain, palpitations, abdominal pain, diarrhea, urinary changes.   First responders noted that patient had O2 sats in the 80s on room air.  They placed patient on nonrebreather and gave him albuterol, Solu-Medrol, nitroglycerin en route to [facility] ED.   ED course: Patient placed on BiPAP on arrival. CBC unremarkable.  BMP with bicarb 20, glucose 142, creatinine 1.17 (around his baseline).  BNP 789.  Negative for alcohol.  Urine drug screen positive for cocaine use.  Troponins 34 > 37. Lactic acid initially normal but still mildly elevated on recheck at 2.0.  I-STAT ABG unremarkable.  Chest x-ray showing likely pulmonary edema in R hemithorax.  Patient given IV Lasix and started on nitroglycerin drip per ED provider.  [facility] hospitalist asked to evaluate patient for admission.  Assessment and Plan: Acute hypoxic respiratory failure secondary to acute pulmonary edema Decompensated combined systolic and diastolic HF  Cardiomyopathy   Patient presenting with acute hypoxic respiratory failure likely secondary to volume overload.   -Hx of active cocaine abuse -2d echo reviewed. EF 25-30% -Cardiology following. Recs to continue IV lasix, cont aldactone with irbesartan, resumed bidil  -Coreg on hold due to decompensated CHF   Hypertensive emergency vs chronic uncontrolled HTN -was on nitroglycerin gtt, now off -continue IV lasix, cont aldactone with irbesartan, cont bidil   Type 2 NSTEMI -will need cessation of cocaine use   Cocaine Use Disorder Likely playing a major role in his underlying heart failure and cardiomyopathy.   -TOC consult for substance use resources   Type 2 diabetes On metformin XR at home.  CBG 142.   -Most recent A1c of 6.5 on 7/23 -Will repeat A1c and cont on SSI while in hospital -Cont metformin at discharge   CKD stage II Cr currently stable compared to prior renal panels over the past several months -recheck bmet in AM   History of stroke -Continue home Lipitor and aspirin   Depression -Continue home Zoloft   OSA -Nonadherent with CPAP  Leukocytosis -New WBC of 17k today -Afebrile -Will check blood cx and ua/urine cx -Presenting CXR with findings of patchy opacities. Will start empiric azythro and rocephin -recheck CBC in AM   Subjective: Difficult to assess given mentation  Physical Exam: Vitals:   11/17/23 0600 11/17/23 0829 11/17/23 1222 11/17/23 1243  BP:  (!) 154/114 (!) 154/98   Pulse: 89 87 87   Resp: 16 16 16    Temp:  98 F (36.7 C)    TempSrc:  Oral  Oral  SpO2: 99%  97% 92%   Weight:      Height:       General exam: laying in bed, in nad Respiratory system: Normal respiratory effort, no wheezing Cardiovascular system: regular rate, s1, s2 Gastrointestinal system: Soft, nondistended, positive BS Central nervous system: CN2-12 grossly intact, strength intact Extremities: Perfused, no clubbing Skin: Normal skin turgor, no notable skin lesions  seen Psychiatry: Mood normal // no visual hallucinations   Data Reviewed: Labs reviewed: Na 138, K 3.7, Cr 1.64, WBC 17.1  Family Communication: Pt in room, family not at bedside  Disposition: Status is: Inpatient Remains inpatient appropriate because: Severity of illness  Planned Discharge Destination: Home    Author: Rickey Barbara, MD 11/17/2023 4:36 PM  For on call review [email or web address].

## 2023-11-18 DIAGNOSIS — J9621 Acute and chronic respiratory failure with hypoxia: Secondary | ICD-10-CM | POA: Diagnosis not present

## 2023-11-18 DIAGNOSIS — I5033 Acute on chronic diastolic (congestive) heart failure: Secondary | ICD-10-CM | POA: Diagnosis not present

## 2023-11-18 DIAGNOSIS — J810 Acute pulmonary edema: Secondary | ICD-10-CM | POA: Diagnosis not present

## 2023-11-18 DIAGNOSIS — J9601 Acute respiratory failure with hypoxia: Secondary | ICD-10-CM | POA: Diagnosis not present

## 2023-11-18 LAB — CBC
HCT: 42.2 % (ref 39.0–52.0)
Hemoglobin: 14.2 g/dL (ref 13.0–17.0)
MCH: 29.2 pg (ref 26.0–34.0)
MCHC: 33.6 g/dL (ref 30.0–36.0)
MCV: 86.8 fL (ref 80.0–100.0)
Platelets: 256 10*3/uL (ref 150–400)
RBC: 4.86 MIL/uL (ref 4.22–5.81)
RDW: 13.2 % (ref 11.5–15.5)
WBC: 8.7 10*3/uL (ref 4.0–10.5)
nRBC: 0 % (ref 0.0–0.2)

## 2023-11-18 LAB — COMPREHENSIVE METABOLIC PANEL
ALT: 19 U/L (ref 0–44)
AST: 16 U/L (ref 15–41)
Albumin: 2.7 g/dL — ABNORMAL LOW (ref 3.5–5.0)
Alkaline Phosphatase: 65 U/L (ref 38–126)
Anion gap: 10 (ref 5–15)
BUN: 27 mg/dL — ABNORMAL HIGH (ref 6–20)
CO2: 25 mmol/L (ref 22–32)
Calcium: 8.6 mg/dL — ABNORMAL LOW (ref 8.9–10.3)
Chloride: 102 mmol/L (ref 98–111)
Creatinine, Ser: 1.57 mg/dL — ABNORMAL HIGH (ref 0.61–1.24)
GFR, Estimated: 51 mL/min — ABNORMAL LOW (ref >60)
Glucose, Bld: 107 mg/dL — ABNORMAL HIGH (ref 70–99)
Potassium: 3.6 mmol/L (ref 3.5–5.1)
Sodium: 137 mmol/L (ref 135–145)
Total Bilirubin: 0.5 mg/dL (ref <1.2)
Total Protein: 5.9 g/dL — ABNORMAL LOW (ref 6.5–8.1)

## 2023-11-18 LAB — GLUCOSE, CAPILLARY
Glucose-Capillary: 129 mg/dL — ABNORMAL HIGH (ref 70–99)
Glucose-Capillary: 105 mg/dL — ABNORMAL HIGH (ref 70–99)
Glucose-Capillary: 102 mg/dL — ABNORMAL HIGH (ref 70–99)
Glucose-Capillary: 122 mg/dL — ABNORMAL HIGH (ref 70–99)

## 2023-11-18 MED ORDER — FUROSEMIDE 10 MG/ML IJ SOLN
80.0000 mg | Freq: Two times a day (BID) | INTRAMUSCULAR | Status: DC
  Administered 2023-11-18 – 2023-11-19 (×2): 80 mg via INTRAVENOUS
  Filled 2023-11-18 (×2): qty 8

## 2023-11-18 MED ORDER — ISOSORB DINITRATE-HYDRALAZINE 20-37.5 MG PO TABS
1.5000 | ORAL_TABLET | Freq: Two times a day (BID) | ORAL | Status: DC
  Administered 2023-11-18 – 2023-11-19 (×2): 1.5 via ORAL
  Filled 2023-11-18 (×2): qty 2

## 2023-11-18 MED ORDER — LOSARTAN POTASSIUM 50 MG PO TABS
50.0000 mg | ORAL_TABLET | Freq: Every day | ORAL | Status: DC
Start: 2023-11-19 — End: 2023-11-20
  Administered 2023-11-19 – 2023-11-20 (×2): 50 mg via ORAL
  Filled 2023-11-18 (×2): qty 1

## 2023-11-18 MED ORDER — POTASSIUM CHLORIDE CRYS ER 20 MEQ PO TBCR
40.0000 meq | EXTENDED_RELEASE_TABLET | Freq: Once | ORAL | Status: AC
  Administered 2023-11-18: 40 meq via ORAL
  Filled 2023-11-18: qty 2

## 2023-11-18 NOTE — Progress Notes (Signed)
Heart Failure Navigator Progress Note  Assessed for Heart & Vascular TOC clinic readiness.  Patient does not meet criteria due to Advanced Heart Failure Team patient of Dr. McLean.   Navigator will sign off at this time.   Mahreen Schewe, BSN, RN Heart Failure Nurse Navigator Secure Chat Only   

## 2023-11-18 NOTE — Consult Note (Addendum)
Advanced Heart Failure Team Consult Note   Primary Physician: [name], MD PCP-Cardiologist:  None  Reason for Consultation: Acute on chronic systolic heart failure  HPI:    Matthew [name]. is seen today for evaluation of acute on chronic systolic heart failure at the request of Dr. Rhona Leavens, [facility].   Matthew [name]. is a 57 y.o. with PMH significant for combined systolic CHF with biventricular failure, T2DM, HTN, OSA, tobacco use, HLD, cocaine use, poor medication adherence, depression,  CVA, and CKD 3a.      First admitted with HF in 2015 in the setting of long standing uncontrolled hypertension and smoking.  Had a LHC with normal cors.    CVA on 01/2017 involving the corpus callosum also punctate acute right parietal infarct.  Was not followed by cardiology or hospitalized for CHF during this time period.     3/21 he began having CHF exacerbations after running out of meds between admissions.  Left AMA. Another similar admission in July, he was out of his medications at that time as well.     Admitted 12/2020 with CHF exacerbation after running out of his medications.  He was diuresed, put on an excellent medication regimen. He also endorsed suicidal ideation at that time and was subsequently admitted to behavioral health. UDS was positive for cocaine, echo showed EF < 20% with moderate RV dysfunction.    Seen in AHF clinic 2/22 and lasix was cut back to 20 mg daily, later Lasix was stopped.    Echo 5/22 with EF improved to 50%.    Follow up 6/22, [name] was stopped due to dizziness.   Follow up 9/22 he was fluid overloaded and SOB. Had ran out of all of his meds, including lasix ~3 months prior. ReDs 46%. Given Lasix 80 mg IV x 1 in the office, GDMT adjusted. Instructed to f/u in 1 week but no showed to the appt.    Follow up 11/22 he had been off several medications, more SOB, continued to use cocaine. Mediations restarted, pillbox filled. Seen back  12/22, stable NYHA III, volume OK ReDs 35%. [name] restarted and advised 4 week follow up. No show x 2 appts.    Echo in 1/23 with EF 30-35%, mild LVH, normal RV.    Admitted 3/23 with SI and auditory hallucinations.    He was seen in AHF clinic 11/13/23, doing fairly well but mildly volume overloaded. Diuretics were increased. He had also been out of his coreg and bidil for a couple of days. He denied ETOH or drug use however he was very animated on assessment so UDS was checked prior to restarting coreg and it came back positive for cocaine. Coreg not restarted. Over the weekend he continued to go on a cocaine binge and ended up coming in on the 30th with acute SOB.  First responders found him with O2 sats in the 80s.  Placed on a nonrebreather  and later CPAP. SBP on admission 180s, briefly placed on nitro drip. Had not been compliant with his HF meds.  Admission labs reviewed: K 3.6, CR 1.17, BNP 788, HsTrop 34> 37> 45.  Lactic acid 1.7> 2> 2.3> 1.2.  HgbA1c 6.2.  UDS positive for cocaine.   Since admitted he has been diuresed some with IV lasix. I&Os not documented but he reports frequent trips to restroom.  Denies chest pain.  Still tachypneic on assessment.    Home Medications Prior to Admission medications  Medication Sig Start Date End Date Taking? Authorizing Provider  atorvastatin (LIPITOR) 80 MG tablet Take 1 tablet (80 mg total) by mouth daily. 11/04/23  Yes [name], MD  carvedilol (COREG) 6.25 MG tablet Take 1 tablet (6.25 mg total) by mouth 2 (two) times daily. 11/13/23  Yes Alen Bleacher, NP  ezetimibe (ZETIA) 10 MG tablet Take 1 tablet (10 mg total) by mouth daily. 11/04/23  Yes [name], MD  furosemide (LASIX) 40 MG tablet Take 1 tablet (40 mg total) by mouth daily. 11/13/23  Yes Alen Bleacher, NP  gabapentin (NEURONTIN) 300 MG capsule Take 1 capsule (300 mg total) by mouth at bedtime. 11/04/23  Yes [name], MD  metFORMIN (GLUCOPHAGE-XR) 500 MG 24 hr tablet  Take 1 tablet (500 mg total) by mouth daily with breakfast. 11/04/23  Yes Newlin, Enobong, MD  sertraline (ZOLOFT) 100 MG tablet Take 1 tablet (100 mg total) by mouth daily. 11/04/23  Yes [name], MD  aspirin EC 81 MG tablet Take 1 tablet (81 mg total) by mouth daily. Swallow whole. Patient not taking: Reported on 11/16/2023 07/16/22   Mayers, Cari S, PA-C  BIDIL 20-37.5 MG tablet Take 1 tablet by mouth 3 (three) times daily. Patient not taking: Reported on 11/16/2023 11/13/23   Alen Bleacher, NP  hydrOXYzine (ATARAX) 25 MG tablet Take 1 tablet (25 mg total) by mouth 3 (three) times daily as needed for anxiety. Patient not taking: Reported on 11/16/2023 09/19/22   Mayers, Cari S, PA-C  melatonin 5 MG TABS Take 1 tablet (5 mg total) by mouth at bedtime. Patient not taking: Reported on 11/16/2023 07/16/22   Mayers, Cari S, PA-C  spironolactone (ALDACTONE) 25 MG tablet Take 1 tablet (25 mg total) by mouth daily. Patient not taking: Reported on 11/16/2023 08/13/22   [name], MD  timolol (TIMOPTIC) 0.5 % ophthalmic solution Place 1 drop into both eyes daily after breakfast. Patient not taking: Reported on 11/16/2023 07/16/22   Mayers, Cari S, PA-C  lovastatin (MEVACOR) 20 MG tablet Take 2 tablets (40 mg total) by mouth at bedtime. 06/24/14 08/26/14  Doris Cheadle, MD   Past Medical History: Past Medical History:  Diagnosis Date   Acute combined systolic and diastolic CHF, NYHA class 3 (HCC)    a. 02/2014 Echo: EF 25-30%.   Blindness of right eye    Cardiomyopathy (HCC)    a. 02/2014 Echo: EF 25-30%, sev glob HK with inferolat HK->AK, mod conc LVH, Gr 2 DD, Mild MR, sev dil LA.   CKD (chronic kidney disease), stage III (HCC)    Cocaine abuse (HCC)    Depression    DM (diabetes mellitus), type 2 (HCC)    GAD (generalized anxiety disorder)    HFrEF (heart failure with reduced ejection fraction) (HCC)    High cholesterol    Hypertension    Morbid obesity (HCC)    OSA (obstructive sleep  apnea)    Stroke (HCC)    Tobacco abuse    Past Surgical History: Past Surgical History:  Procedure Laterality Date   CATARACT EXTRACTION Right    LEFT HEART CATHETERIZATION WITH CORONARY ANGIOGRAM N/A 03/04/2014   Procedure: LEFT HEART CATHETERIZATION WITH CORONARY ANGIOGRAM;  Surgeon: Kathleene Hazel, MD;  Location: [facility] CATH LAB;  Service: Cardiovascular;  Laterality: N/A;   TEE WITHOUT CARDIOVERSION N/A 02/04/2017   Procedure: TRANSESOPHAGEAL ECHOCARDIOGRAM (TEE);  Surgeon: Lars Masson, MD;  Location[facility] ENDOSCOPY;  Service: Cardiovascular;  Laterality: N/A;   Family History: Family  History  Problem Relation Age of Onset   Heart attack Father        died @ 72   Cervical cancer Mother        died @ 15   Social History: Social History   Socioeconomic History   Marital status: Widowed    Spouse name: Not on file   Number of children: 2   Years of education: 14   Highest education level: Not on file  Occupational History   Occupation: [employer]  Tobacco Use   Smoking status: Every Day    Types: Cigarettes   Smokeless tobacco: Former    Quit date: 11/14/2020  Vaping Use   Vaping status: Never Used  Substance and Sexual Activity   Alcohol use: No   Drug use: Yes    Types: Cocaine   Sexual activity: Yes  Other Topics Concern   Not on file  Social History Narrative   Lives with 2 young children in [place].  Wife died in May 17, 2020.  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   Left-handed   Caffeine: occasional tea   Social Determinants of Health   Financial Resource Strain: High Risk (01/24/2021)   Overall Financial Resource Strain (CARDIA)    Difficulty of Paying Living Expenses: Very hard  Food Insecurity: No Food Insecurity (11/16/2023)   Hunger Vital Sign    Worried About Running Out of Food in the Last Year: Never true    Ran Out of Food in the Last Year: Never true  Transportation Needs: No Transportation Needs (11/16/2023)    PRAPARE - Administrator, Civil Service (Medical): No    Lack of Transportation (Non-Medical): No  Physical Activity: Not on file  Stress: Stress Concern Present (01/24/2021)   [employer] of Occupational Health - Occupational Stress Questionnaire    Feeling of Stress : Very much  Social Connections: Unknown (05/01/2022)   [facility]   Social Network    Social Network: Not on file   Allergies:  No Known Allergies  Objective:    Vital Signs:   Temp:  [97.4 F (36.3 C)-97.8 F (36.6 C)] 97.5 F (36.4 C) (12/02 0603) Pulse Rate:  [71-90] 79 (12/02 0603) Resp:  [16-20] 20 (12/02 0603) BP: (150-170)/(101-111) 168/110 (12/02 0603) SpO2:  [96 %-100 %] 100 % (12/02 0603) Weight:  [72.9 kg] 72.9 kg (12/02 0603) Last BM Date : 11/16/23  Weight change: Filed Weights   11/16/23 2023 11/17/23 0500 11/18/23 0603  Weight: 73.5 kg 73.4 kg 72.9 kg    Intake/Output:   Intake/Output Summary (Last 24 hours) at 11/18/2023 1235 Last data filed at 11/18/2023 0900 Gross per 24 hour  Intake 236 ml  Output --  Net 236 ml      Physical Exam  General:  well appearing.  No respiratory difficulty HEENT: normal Neck: supple. JVD ~12 cm. Carotids 2+ bilat; no bruits. No lymphadenopathy or thyromegaly appreciated. Cor: PMI nondisplaced. Regular rate & rhythm. No rubs, gallops or murmurs. Lungs: clear Abdomen: soft, nontender, nondistended. No hepatosplenomegaly. No bruits or masses. Good bowel sounds. Extremities: no cyanosis, clubbing, rash, edema  Neuro: alert & oriented x 3, cranial nerves grossly intact. moves all 4 extremities w/o difficulty. Affect pleasant.   Telemetry   NSR 90s (Personally reviewed)    EKG    NSR 90s  Labs   Basic Metabolic Panel: Recent Labs  Lab 11/13/23 1454 11/16/23 1451 11/16/23 1520  11/16/23 1526 11/17/23 1039 11/18/23 0640  NA 139 142 138 141 138 137  K 3.6 3.4* 3.6 3.6 3.7 3.6  CL 106  --  106 106  103 102  CO2 27  --  20*  --  24 25  GLUCOSE 75  --  142* 147* 157* 107*  BUN 15  --  14 15 24* 27*  CREATININE 1.51*  --  1.17 1.30* 1.64* 1.57*  CALCIUM 8.7*  --  8.9  --  9.2 8.6*    Liver Function Tests: Recent Labs  Lab 11/18/23 0640  AST 16  ALT 19  ALKPHOS 65  BILITOT 0.5  PROT 5.9*  ALBUMIN 2.7*   No results for input(s): "LIPASE", "AMYLASE" in the last 168 hours. No results for input(s): "AMMONIA" in the last 168 hours.  CBC: Recent Labs  Lab 11/13/23 1454 11/16/23 1451 11/16/23 1520 11/16/23 1526 11/17/23 1039 11/18/23 0640  WBC 5.6  --  8.9  --  17.1* 8.7  NEUTROABS  --   --  8.2*  --   --   --   HGB 12.9* 15.6 15.4 15.6 15.8 14.2  HCT 39.8 46.0 45.9 46.0 46.2 42.2  MCV 88.6  --  89.0  --  84.6 86.8  PLT 248  --  234  --  287 256    Cardiac Enzymes: No results for input(s): "CKTOTAL", "CKMB", "CKMBINDEX", "TROPONINI" in the last 168 hours.  BNP: BNP (last 3 results) Recent Labs    07/04/23 1136 11/13/23 1454 11/16/23 1520  BNP 47.0 372.4* 788.9*    ProBNP (last 3 results) No results for input(s): "PROBNP" in the last 8760 hours.   CBG: Recent Labs  Lab 11/17/23 0909 11/17/23 2058 11/18/23 0742 11/18/23 1159  GLUCAP 137* 145* 129* 105*    Coagulation Studies: No results for input(s): "LABPROT", "INR" in the last 72 hours.   Imaging   No results found.   Medications:     Current Medications:  aspirin EC  81 mg Oral Daily   atorvastatin  80 mg Oral Daily   azithromycin  500 mg Oral Q24H   enoxaparin (LOVENOX) injection  40 mg Subcutaneous Q24H   ezetimibe  10 mg Oral Daily   furosemide  80 mg Intravenous Daily   insulin aspart  0-15 Units Subcutaneous TID WC   insulin aspart  0-5 Units Subcutaneous QHS   irbesartan  37.5 mg Oral Daily   isosorbide-hydrALAZINE  1 tablet Oral BID   sertraline  100 mg Oral Daily   spironolactone  25 mg Oral Daily    Infusions:  cefTRIAXone (ROCEPHIN)  IV Stopped (11/17/23 2207)       Patient Profile   Matthew [name]. is a 57 y.o. with PMH significant for combined systolic CHF with biventricular failure, T2DM, HTN, OSA, tobacco use, HLD, cocaine use, poor medication adherence, depression,  CVA, and CKD 3a.   AHF team to see for A/C systolic heart failure.   Assessment/Plan  Acute on chronic systolic heart failure - Nonischemic cardiomyopathy.  Due to poorly controlled HTN +/- cocaine abuse.  Normal coronaries on 2015 cath.  Echo in 1/22 showed EF < 20% with moderate RV dysfunction (off meds, + cocaine).  Echo in 5/22 with EF up to 50% (on meds and off cocaine). Echo 1/23 (off meds and on cocaine) EF down to 30-35%. Echo 7/24 EF 25-30%, GIDD, RV normal, LA mildly dilated, small pericardial effusion present.  - NYHA class IIIb, remains mildly volume overloaded.  In setting of cocaine use, uncontrolled hypertension and medication noncompliance.  - Increase lasix 80 IV daily>BID - Continue spironolactone 25 mg daily.  - Off BB with recurrent cocaine use - Increase BiDil 1>1.5 tab tid. - Did not tolerate Farxiga due to dizziness  - Updated echo this admission with EF 25-30%, no thrombus seen but swirling of contrast noted, LV with GHK, RV normal, LA severely dilated, small pericardial effusion, trivial MR HTN urgency - SBP up to 180s on admission. Suspect 2/2 cocaine and not taking meds - still high, 160s today - Increase Bidil to 1.5 tabs BID - Off coreg with cocaine use Cocaine use - + in clinic last week and again on admission - cessation advised - TOC team consulted to help with additional resources that may be available to him OSA Not compliant with CPAP, needs new one. Has had sleep study arranged for new  machine.  - Has not scheduled sleep study.  Tobacco Use Disorder: He has quit smoking.  CKD 3: SCr 1.57 - Baseline around 1.4 Hyperlipidemia: Continue atorvastatin.  - LDL 16 8/23. Up to 103 7/24.  Depression: Controlled.   T2DM: management per  PCP. - Did not tolerate Farxiga due to dizziness.  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   Length of Stay: 2  Alen Bleacher, NP  11/18/2023, 12:35 PM  Advanced Heart Failure Team Pager [phone] (M-F; 7a - 5p)  Please contact [facility] for night-coverage after hours (4p -7a ) and weekends on [email or web address]   Patient seen with NP, agree with the above note.   Patient was admitted with CHF exacerbation and hypertensive urgency in setting of cocaine use. Not taking home meds.  He has been on IV Lasix here and reports going to the bathroom a lot but I/Os not recorded. Weight trending down.  He is mildly tachypneic.   General: Mild tachypnea Neck: JVP 8-9 cm, no thyromegaly or thyroid nodule.  Lungs: Clear to auscultation bilaterally with normal respiratory effort. CV: Nondisplaced PMI.  Heart regular S1/S2, no S3/S4, no murmur.  No peripheral edema.  No carotid bruit.  Normal pedal pulses.  Abdomen: Soft, nontender, no hepatosplenomegaly, no distention.  Skin: Intact without lesions or rashes.  Neurologic: Alert and oriented x 3.  Psych: Normal affect. Extremities: No clubbing or cyanosis.  HEENT: Normal.   1. Acute on chronic systolic CHF: Nonischemic cardiomyopathy.  Due to poorly controlled HTN and cocaine abuse.  Normal coronaries on 2015 cath.  Echo in 1/22 showed EF < 20% with moderate RV dysfunction (off meds, + cocaine).  Echo in 5/22 with EF up to 50% (on meds and off cocaine). Echo 1/23 (off meds and on cocaine) EF down to 30-35%. Echo 7/24 EF 25-30%, GIDD, RV normal, LA mildly dilated, small pericardial effusion present. Echo this admission with EF 25-30%, no thrombus seen but swirling of contrast noted, LV with GHK, RV normal, LA severely dilated, small pericardial effusion, trivial MR.  He was admitted with hypertensive urgency in setting of cocaine use, not compliant with home meds. He has been diuresed, I/Os not  recorded but weight down. Creatinine stable at 1.57. Mild volume overload on exam.  - Lasix 80 mg IV bid today and reassess tomorrow.  - Stopped Entresto because it caused headaches, does not want to restart.  - Would stop irbesartan and use losartan 50 mg daily.  - Continue spironolactone 25 mg daily.  -  Off Coreg with active cocaine use.  - Increase Bidil to 1.5 tab tid.  - Did not tolerate Farxiga due to dizziness  - Not candidate for ICD or advanced therapies with active cocaine abuse.  2. HTN: BP remains elevated, increasing Bidil as above.    3. OSA: Not compliant with CPAP.  4. Tobacco Use Disorder: He has quit smoking.  5. Cocaine Use Disorder:  He has restarted using cocaine.  Urged him to quit.  6. CKD 3: Creatinine stable at 1.57 today.  7. Hyperlipidemia: Continue atorvastatin.  8. Depression: Controlled.  9. DM2: Per primary service.  10. 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  Marca Ancona 11/18/2023 3:57 PM

## 2023-11-18 NOTE — Consult Note (Incomplete)
Advanced Heart Failure Team Consult Note   Primary Physician: [name], MD PCP-Cardiologist:  None  Reason for Consultation: ***  HPI:    Matthew [name]. is seen today for evaluation of *** at the request of ***.   Echo ***  Review of Systems: [y] = yes, [ ]  = no   General: Weight gain [ ] ; Weight loss [ ] ; Anorexia [ ] ; Fatigue [ ] ; Fever [ ] ; Chills [ ] ; Weakness [ ]   Cardiac: Chest pain/pressure [ ] ; Resting SOB [ ] ; Exertional SOB [ ] ; Orthopnea [ ] ; Pedal Edema [ ] ; Palpitations [ ] ; Syncope [ ] ; Presyncope [ ] ; Paroxysmal nocturnal dyspnea[ ]   Pulmonary: Cough [ ] ; Wheezing[ ] ; Hemoptysis[ ] ; Sputum [ ] ; Snoring [ ]   GI: Vomiting[ ] ; Dysphagia[ ] ; Melena[ ] ; Hematochezia [ ] ; Heartburn[ ] ; Abdominal pain [ ] ; Constipation [ ] ; Diarrhea [ ] ; BRBPR [ ]   GU: Hematuria[ ] ; Dysuria [ ] ; Nocturia[ ]   Vascular: Pain in legs with walking [ ] ; Pain in feet with lying flat [ ] ; Non-healing sores [ ] ; Stroke [ ] ; TIA [ ] ; Slurred speech [ ] ;  Neuro: Headaches[ ] ; Vertigo[ ] ; Seizures[ ] ; Paresthesias[ ] ;Blurred vision [ ] ; Diplopia [ ] ; Vision changes [ ]   Ortho/Skin: Arthritis [ ] ; Joint pain [ ] ; Muscle pain [ ] ; Joint swelling [ ] ; Back Pain [ ] ; Rash [ ]   Psych: Depression[ ] ; Anxiety[ ]   Heme: Bleeding problems [ ] ; Clotting disorders [ ] ; Anemia [ ]   Endocrine: Diabetes [ ] ; Thyroid dysfunction[ ]   Home Medications Prior to Admission medications   Medication Sig Start Date End Date Taking? Authorizing Provider  atorvastatin (LIPITOR) 80 MG tablet Take 1 tablet (80 mg total) by mouth daily. 11/04/23  Yes [name], MD  carvedilol (COREG) 6.25 MG tablet Take 1 tablet (6.25 mg total) by mouth 2 (two) times daily. 11/13/23  Yes Alen Bleacher, NP  ezetimibe (ZETIA) 10 MG tablet Take 1 tablet (10 mg total) by mouth daily. 11/04/23  Yes [name], MD  furosemide (LASIX) 40 MG tablet Take 1 tablet (40 mg total) by mouth daily. 11/13/23  Yes Alen Bleacher, NP   gabapentin (NEURONTIN) 300 MG capsule Take 1 capsule (300 mg total) by mouth at bedtime. 11/04/23  Yes [name], MD  metFORMIN (GLUCOPHAGE-XR) 500 MG 24 hr tablet Take 1 tablet (500 mg total) by mouth daily with breakfast. 11/04/23  Yes Newlin, Enobong, MD  sertraline (ZOLOFT) 100 MG tablet Take 1 tablet (100 mg total) by mouth daily. 11/04/23  Yes [name], MD  aspirin EC 81 MG tablet Take 1 tablet (81 mg total) by mouth daily. Swallow whole. Patient not taking: Reported on 11/16/2023 07/16/22   Mayers, Cari S, PA-C  BIDIL 20-37.5 MG tablet Take 1 tablet by mouth 3 (three) times daily. Patient not taking: Reported on 11/16/2023 11/13/23   Alen Bleacher, NP  hydrOXYzine (ATARAX) 25 MG tablet Take 1 tablet (25 mg total) by mouth 3 (three) times daily as needed for anxiety. Patient not taking: Reported on 11/16/2023 09/19/22   Mayers, Cari S, PA-C  melatonin 5 MG TABS Take 1 tablet (5 mg total) by mouth at bedtime. Patient not taking: Reported on 11/16/2023 07/16/22   Mayers, Cari S, PA-C  spironolactone (ALDACTONE) 25 MG tablet Take 1 tablet (25 mg total) by mouth daily. Patient not taking: Reported on 11/16/2023 08/13/22   [name], MD  timolol (TIMOPTIC) 0.5 % ophthalmic  solution Place 1 drop into both eyes daily after breakfast. Patient not taking: Reported on 11/16/2023 07/16/22   Mayers, Cari S, PA-C  lovastatin (MEVACOR) 20 MG tablet Take 2 tablets (40 mg total) by mouth at bedtime. 06/24/14 08/26/14  Doris Cheadle, MD    Past Medical History: Past Medical History:  Diagnosis Date   Acute combined systolic and diastolic CHF, NYHA class 3 (HCC)    a. 02/2014 Echo: EF 25-30%.   Blindness of right eye    Cardiomyopathy (HCC)    a. 02/2014 Echo: EF 25-30%, sev glob HK with inferolat HK->AK, mod conc LVH, Gr 2 DD, Mild MR, sev dil LA.   CKD (chronic kidney disease), stage III (HCC)    Cocaine abuse (HCC)    Depression    DM (diabetes mellitus), type 2 (HCC)    GAD  (generalized anxiety disorder)    HFrEF (heart failure with reduced ejection fraction) (HCC)    High cholesterol    Hypertension    Morbid obesity (HCC)    OSA (obstructive sleep apnea)    Stroke (HCC)    Tobacco abuse     Past Surgical History: Past Surgical History:  Procedure Laterality Date   CATARACT EXTRACTION Right    LEFT HEART CATHETERIZATION WITH CORONARY ANGIOGRAM N/A 03/04/2014   Procedure: LEFT HEART CATHETERIZATION WITH CORONARY ANGIOGRAM;  Surgeon: Kathleene Hazel, MD;  Location: [facility] CATH LAB;  Service: Cardiovascular;  Laterality: N/A;   TEE WITHOUT CARDIOVERSION N/A 02/04/2017   Procedure: TRANSESOPHAGEAL ECHOCARDIOGRAM (TEE);  Surgeon: Lars Masson, MD;  Location: [facility] ENDOSCOPY;  Service: Cardiovascular;  Laterality: N/A;    Family History: Family History  Problem Relation Age of Onset   Heart attack Father        died @ 38   Cervical cancer Mother        died @ 2    Social History: Social History   Socioeconomic History   Marital status: Widowed    Spouse name: Not on file   Number of children: 2   Years of education: 14   Highest education level: Not on file  Occupational History   Occupation: [employer]  Tobacco Use   Smoking status: Every Day    Types: Cigarettes   Smokeless tobacco: Former    Quit date: 11/14/2020  Vaping Use   Vaping status: Never Used  Substance and Sexual Activity   Alcohol use: No   Drug use: Yes    Types: Cocaine   Sexual activity: Yes  Other Topics Concern   Not on file  Social History Narrative   Lives with 2 young children in [place].  Wife died in May 12, 2020.  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   Left-handed   Caffeine: occasional tea   Social Determinants of Health   Financial Resource Strain: High Risk (01/24/2021)   Overall Financial Resource Strain (CARDIA)    Difficulty of Paying Living Expenses: Very hard  Food Insecurity: No Food Insecurity (11/16/2023)   Hunger  Vital Sign    Worried About Running Out of Food in the Last Year: Never true    Ran Out of Food in the Last Year: Never true  Transportation Needs: No Transportation Needs (11/16/2023)   PRAPARE - Administrator, Civil Service (Medical): No    Lack of Transportation (Non-Medical): No  Physical Activity: Not on file  Stress: Stress Concern Present (01/24/2021)   [employer] of Occupational Health -  Occupational Stress Questionnaire    Feeling of Stress : Very much  Social Connections: Unknown (05/01/2022)   Received from [facility], [facility]   Social Network    Social Network: Not on file    Allergies:  No Known Allergies  Objective:    Vital Signs:   Temp:  [97.4 F (36.3 C)-97.8 F (36.6 C)] 97.5 F (36.4 C) (12/02 0603) Pulse Rate:  [71-90] 79 (12/02 0603) Resp:  [16-20] 20 (12/02 0603) BP: (150-170)/(98-111) 168/110 (12/02 0603) SpO2:  [92 %-100 %] 100 % (12/02 0603) Weight:  [72.9 kg] 72.9 kg (12/02 0603) Last BM Date : 11/16/23  Weight change: Filed Weights   11/16/23 2023 11/17/23 0500 11/18/23 0603  Weight: 73.5 kg 73.4 kg 72.9 kg    Intake/Output:   Intake/Output Summary (Last 24 hours) at 11/18/2023 1155 Last data filed at 11/18/2023 0900 Gross per 24 hour  Intake 236 ml  Output --  Net 236 ml      Physical Exam    General:  Well appearing. No resp difficulty HEENT: normal Neck: supple. JVP . Carotids 2+ bilat; no bruits. No lymphadenopathy or thyromegaly appreciated. Cor: PMI nondisplaced. Regular rate & rhythm. No rubs, gallops or murmurs. Lungs: clear Abdomen: soft, nontender, nondistended. No hepatosplenomegaly. No bruits or masses. Good bowel sounds. Extremities: no cyanosis, clubbing, rash, edema Neuro: alert & orientedx3, cranial nerves grossly intact. moves all 4 extremities w/o difficulty. Affect pleasant   Telemetry   ***  EKG    ***  Labs   Basic Metabolic Panel: Recent Labs  Lab 11/13/23 1454  11/16/23 1451 11/16/23 1520 11/16/23 1526 11/17/23 1039 11/18/23 0640  NA 139 142 138 141 138 137  K 3.6 3.4* 3.6 3.6 3.7 3.6  CL 106  --  106 106 103 102  CO2 27  --  20*  --  24 25  GLUCOSE 75  --  142* 147* 157* 107*  BUN 15  --  14 15 24* 27*  CREATININE 1.51*  --  1.17 1.30* 1.64* 1.57*  CALCIUM 8.7*  --  8.9  --  9.2 8.6*    Liver Function Tests: Recent Labs  Lab 11/18/23 0640  AST 16  ALT 19  ALKPHOS 65  BILITOT 0.5  PROT 5.9*  ALBUMIN 2.7*   No results for input(s): "LIPASE", "AMYLASE" in the last 168 hours. No results for input(s): "AMMONIA" in the last 168 hours.  CBC: Recent Labs  Lab 11/13/23 1454 11/16/23 1451 11/16/23 1520 11/16/23 1526 11/17/23 1039 11/18/23 0640  WBC 5.6  --  8.9  --  17.1* 8.7  NEUTROABS  --   --  8.2*  --   --   --   HGB 12.9* 15.6 15.4 15.6 15.8 14.2  HCT 39.8 46.0 45.9 46.0 46.2 42.2  MCV 88.6  --  89.0  --  84.6 86.8  PLT 248  --  234  --  287 256    Cardiac Enzymes: No results for input(s): "CKTOTAL", "CKMB", "CKMBINDEX", "TROPONINI" in the last 168 hours.  BNP: BNP (last 3 results) Recent Labs    07/04/23 1136 11/13/23 1454 11/16/23 1520  BNP 47.0 372.4* 788.9*    ProBNP (last 3 results) No results for input(s): "PROBNP" in the last 8760 hours.   CBG: Recent Labs  Lab 11/17/23 0909 11/17/23 2058 11/18/23 0742  GLUCAP 137* 145* 129*    Coagulation Studies: No results for input(s): "LABPROT", "INR" in the last 72 hours.   Imaging  No results found.   Medications:     Current Medications:  aspirin EC  81 mg Oral Daily   atorvastatin  80 mg Oral Daily   azithromycin  500 mg Oral Q24H   enoxaparin (LOVENOX) injection  40 mg Subcutaneous Q24H   ezetimibe  10 mg Oral Daily   furosemide  80 mg Intravenous Daily   insulin aspart  0-15 Units Subcutaneous TID WC   insulin aspart  0-5 Units Subcutaneous QHS   irbesartan  37.5 mg Oral Daily   isosorbide-hydrALAZINE  1 tablet Oral BID    sertraline  100 mg Oral Daily   spironolactone  25 mg Oral Daily    Infusions:  cefTRIAXone (ROCEPHIN)  IV Stopped (11/17/23 2207)      Patient Profile   ***  Assessment/Plan      Length of Stay: 2  Rhena Glace N, PA-C  11/18/2023, 11:55 AM  Advanced Heart Failure Team Pager [phone] (M-F; 7a - 5p)  Please contact [facility] for night-coverage after hours (4p -7a ) and weekends on [email or web address]

## 2023-11-18 NOTE — Plan of Care (Signed)
  Problem: Education: Goal: Knowledge of General Education information will improve Description: Including pain rating scale, medication(s)/side effects and non-pharmacologic comfort measures Outcome: Not Progressing   Problem: Health Behavior/Discharge Planning: Goal: Ability to manage health-related needs will improve Outcome: Not Progressing   Problem: Coping: Goal: Level of anxiety will decrease Outcome: Not Progressing   Problem: Safety: Goal: Ability to remain free from injury will improve Outcome: Progressing   Problem: Skin Integrity: Goal: Risk for impaired skin integrity will decrease Outcome: Progressing   Problem: Education: Goal: Ability to demonstrate management of disease process will improve Outcome: Not Progressing Goal: Ability to verbalize understanding of medication therapies will improve Outcome: Not Progressing Goal: Individualized Educational Video(s) Outcome: Not Progressing

## 2023-11-18 NOTE — Progress Notes (Signed)
Progress Note   Patient: Matthew [name]. ZOX:[id number] DOB: December 25, 1965 DOA: 11/16/2023     2 DOS: the patient was seen and examined on 11/18/2023   Brief hospital course:  57 y.o. 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   Patient is a difficult historian, unable to elicit much information from him.  He states that he suddenly became short of breath this morning and this did not resolve.  It became progressively worse.  States that this has happened before.  He does endorse cocaine use over the past 2 days, last use yesterday.  He states that he does take his home medications regularly, although unable to tell me which medications he is currently on.  He denies any headaches, vision changes, nausea, fevers, cough, chest pain, palpitations, abdominal pain, diarrhea, urinary changes.   First responders noted that patient had O2 sats in the 80s on room air.  They placed patient on nonrebreather and gave him albuterol, Solu-Medrol, nitroglycerin en route to [facility] ED.   ED course: Patient placed on BiPAP on arrival. CBC unremarkable.  BMP with bicarb 20, glucose 142, creatinine 1.17 (around his baseline).  BNP 789.  Negative for alcohol.  Urine drug screen positive for cocaine use.  Troponins 34 > 37. Lactic acid initially normal but still mildly elevated on recheck at 2.0.  I-STAT ABG unremarkable.  Chest x-ray showing likely pulmonary edema in R hemithorax.  Patient given IV Lasix and started on nitroglycerin drip per ED provider.  [facility] hospitalist asked to evaluate patient for admission.  Assessment and Plan: Acute hypoxic respiratory failure secondary to acute pulmonary edema Decompensated combined systolic and diastolic HF  Cardiomyopathy   Patient presenting with acute hypoxic respiratory failure likely secondary to volume overload.   -Hx of active cocaine abuse -2d echo reviewed. EF 25-30% -Cardiology following. Pt is continued on 80mg  IV lasix, cont aldactone with irbesartan, resumed bidil  -Coreg on hold due to decompensated CHF   Hypertensive emergency vs chronic uncontrolled HTN -was on nitroglycerin gtt, now off -continue IV lasix, cont aldactone with irbesartan, cont bidil   Type 2 NSTEMI -will need cessation of cocaine use   Cocaine Use Disorder Likely playing a major role in his underlying heart failure and cardiomyopathy.   -TOC was consulted for substance use resources   Type 2 diabetes On metformin XR at home.  -A1c of 6.2 -Cont on SSI as needed -Cont metformin at discharge   CKD stage II Cr currently stable compared to prior renal panels over the past several months -recheck bmet in AM   History of stroke -Continue home Lipitor and aspirin   Depression -Continue home Zoloft   OSA -Nonadherent with CPAP  Leukocytosis -WBC of 17.1k on 12/1 -Afebrile -UA clear -Presenting CXR with findings of patchy opacities. Started empiric azythro and rocephin -WBC has normalized   Subjective: Without complaints. Asked for help putting his hospital gown back on  Physical Exam: Vitals:   11/17/23 1710 11/17/23 1932 11/18/23 0022 11/18/23 0603  BP: (!) 170/108 (!) 164/111 (!) 150/101 (!) 168/110  Pulse: 90 86 71 79  Resp: 18 16 18 20   Temp: 97.8 F (36.6 C) (!) 97.4 F (36.3 C) (!) 97.5 F (36.4 C) (!) 97.5 F (36.4 C)  TempSrc: Oral Oral Oral Oral  SpO2: 98% 100% 96% 100%  Weight:    72.9 kg  Height:       General exam: Conversant, in no acute distress Respiratory system: normal chest rise, clear, no audible wheezing Cardiovascular system: regular rhythm, s1-s2 Gastrointestinal system: Nondistended, nontender, pos BS Central nervous system: No seizures, no tremors Extremities: No cyanosis,  no joint deformities Skin: No rashes, no pallor Psychiatry: Affect normal // no auditory hallucinations   Data Reviewed: Labs reviewed: Na 137, K 3.6, Cr 1.57, WBC 8.7, Hgb 14.2, Plts 256  Family Communication: Pt in room, family not at bedside  Disposition: Status is: Inpatient Remains inpatient appropriate because: Severity of illness  Planned Discharge Destination: Home    Author: Rickey Barbara, MD 11/18/2023 4:23 PM  For on call review [email or web address].

## 2023-11-18 NOTE — TOC Progression Note (Signed)
Transition of Care (TOC) - Progression Note    Patient Details  Name: Matthew [name]. MRN: [id number] Date of Birth: 1966/04/04  Transition of Care [facility]) CM/SW Contact  Nicanor Bake Phone Number: [phone] 11/18/2023, 4:07 PM  Clinical Narrative:  [facility] CSW met with pt at bedside. Pt stated that he lives alone. Pt stated that he has no history of HH services. Pt stated that he uses a CPAP machine. Pt stated that he does not have a scale. Pt stated that he does not work. CSW called and rescheduled pts eye appointment at [facility] on [address] [phone]. Pts appt. Has been rescheduling for Friday, December 20, 2023 at 1:30PM. Pt requested to see if he can get some reading glasses. CSW spoke with pt about scheduling his PCP hospital follow up appointment closer to dc. Pt agrees.   TOC will continue following.      Expected Discharge Plan: Home/Self Care Barriers to Discharge: Continued Medical Work up  Expected Discharge Plan and Services       Living arrangements for the past 2 months: Apartment                                       Social Determinants of Health (SDOH) Interventions SDOH Screenings   Food Insecurity: No Food Insecurity (11/16/2023)  Housing: Low Risk  (11/16/2023)  Transportation Needs: No Transportation Needs (11/16/2023)  Utilities: Not At Risk (11/16/2023)  Alcohol Screen: Low Risk  (02/15/2022)  Depression (PHQ2-9): High Risk (11/04/2023)  Financial Resource Strain: High Risk (01/24/2021)  Social Connections: Unknown (05/01/2022)   Received from [facility]  Stress: Stress Concern Present (01/24/2021)  Tobacco Use: High Risk (11/17/2023)    Readmission Risk Interventions     No data to display

## 2023-11-19 ENCOUNTER — Other Ambulatory Visit (HOSPITAL_COMMUNITY): Payer: Self-pay

## 2023-11-19 DIAGNOSIS — J9621 Acute and chronic respiratory failure with hypoxia: Secondary | ICD-10-CM | POA: Diagnosis not present

## 2023-11-19 DIAGNOSIS — J9601 Acute respiratory failure with hypoxia: Secondary | ICD-10-CM | POA: Diagnosis not present

## 2023-11-19 DIAGNOSIS — J810 Acute pulmonary edema: Secondary | ICD-10-CM | POA: Diagnosis not present

## 2023-11-19 DIAGNOSIS — I5033 Acute on chronic diastolic (congestive) heart failure: Secondary | ICD-10-CM | POA: Diagnosis not present

## 2023-11-19 LAB — GLUCOSE, CAPILLARY
Glucose-Capillary: 90 mg/dL (ref 70–99)
Glucose-Capillary: 203 mg/dL — ABNORMAL HIGH (ref 70–99)
Glucose-Capillary: 106 mg/dL — ABNORMAL HIGH (ref 70–99)
Glucose-Capillary: 100 mg/dL — ABNORMAL HIGH (ref 70–99)

## 2023-11-19 LAB — BASIC METABOLIC PANEL
Anion gap: 12 (ref 5–15)
BUN: 29 mg/dL — ABNORMAL HIGH (ref 6–20)
CO2: 27 mmol/L (ref 22–32)
Calcium: 8.7 mg/dL — ABNORMAL LOW (ref 8.9–10.3)
Chloride: 97 mmol/L — ABNORMAL LOW (ref 98–111)
Creatinine, Ser: 1.7 mg/dL — ABNORMAL HIGH (ref 0.61–1.24)
GFR, Estimated: 47 mL/min — ABNORMAL LOW (ref >60)
Glucose, Bld: 192 mg/dL — ABNORMAL HIGH (ref 70–99)
Potassium: 3.5 mmol/L (ref 3.5–5.1)
Sodium: 136 mmol/L (ref 135–145)

## 2023-11-19 LAB — MAGNESIUM: Magnesium: 1.8 mg/dL (ref 1.7–2.4)

## 2023-11-19 MED ORDER — ISOSORB DINITRATE-HYDRALAZINE 20-37.5 MG PO TABS
2.0000 | ORAL_TABLET | Freq: Three times a day (TID) | ORAL | Status: DC
  Administered 2023-11-19 – 2023-11-20 (×3): 2 via ORAL
  Filled 2023-11-19 (×3): qty 2

## 2023-11-19 MED ORDER — ISOSORB DINITRATE-HYDRALAZINE 20-37.5 MG PO TABS: 2.0000 | ORAL_TABLET | Freq: Two times a day (BID) | ORAL | Status: DC

## 2023-11-19 MED ORDER — POTASSIUM CHLORIDE CRYS ER 20 MEQ PO TBCR
40.0000 meq | EXTENDED_RELEASE_TABLET | Freq: Once | ORAL | Status: AC
  Administered 2023-11-19: 40 meq via ORAL
  Filled 2023-11-19: qty 2

## 2023-11-19 MED ORDER — MAGNESIUM SULFATE 2 GM/50ML IV SOLN
2.0000 g | Freq: Once | INTRAVENOUS | Status: AC
  Administered 2023-11-19: 2 g via INTRAVENOUS
  Filled 2023-11-19: qty 50

## 2023-11-19 MED ORDER — FUROSEMIDE 40 MG PO TABS
40.0000 mg | ORAL_TABLET | Freq: Every day | ORAL | Status: DC
  Administered 2023-11-20: 40 mg via ORAL
  Filled 2023-11-19: qty 1

## 2023-11-19 NOTE — Progress Notes (Signed)
Progress Note   Patient: Matthew [name]. KVQ:[id number] DOB: 1966-09-13 DOA: 11/16/2023     3 DOS: the patient was seen and examined on 11/19/2023   Brief hospital course:  57 y.o. 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   Patient is a difficult historian, unable to elicit much information from him.  He states that he suddenly became short of breath this morning and this did not resolve.  It became progressively worse.  States that this has happened before.  He does endorse cocaine use over the past 2 days, last use yesterday.  He states that he does take his home medications regularly, although unable to tell me which medications he is currently on.  He denies any headaches, vision changes, nausea, fevers, cough, chest pain, palpitations, abdominal pain, diarrhea, urinary changes.   First responders noted that patient had O2 sats in the 80s on room air.  They placed patient on nonrebreather and gave him albuterol, Solu-Medrol, nitroglycerin en route to [facility] ED.   ED course: Patient placed on BiPAP on arrival. CBC unremarkable.  BMP with bicarb 20, glucose 142, creatinine 1.17 (around his baseline).  BNP 789.  Negative for alcohol.  Urine drug screen positive for cocaine use.  Troponins 34 > 37. Lactic acid initially normal but still mildly elevated on recheck at 2.0.  I-STAT ABG unremarkable.  Chest x-ray showing likely pulmonary edema in R hemithorax.  Patient given IV Lasix and started on nitroglycerin drip per ED provider.  [facility] hospitalist asked to evaluate patient for admission.  Assessment and Plan: Acute hypoxic respiratory failure secondary to acute pulmonary edema Decompensated combined systolic and diastolic HF  Cardiomyopathy   Patient presenting with acute hypoxic respiratory failure likely secondary to volume overload.   -Hx of active cocaine abuse -2d echo reviewed. EF 25-30% -Cardiology following. Pt was continued on 80mg  IV lasix, to transition to PO lasix 12/4 -Cardiology recs to increase bidil to 2 tabs TID, continue losartan 50mg  qday, and spironolactone 25mg  qday -Pt had not tolerated entresto or farxiga  -Coreg not given given concern of on-going cocaine abuse   Hypertensive emergency vs chronic uncontrolled HTN -was on nitroglycerin gtt, now off -cont above meds   Type 2 NSTEMI -will need cessation of cocaine use   Cocaine Use Disorder Likely playing a major role in his underlying heart failure and cardiomyopathy.   -TOC was consulted for substance use resources   Type 2 diabetes On metformin XR at home.  -A1c of 6.2 -Cont on SSI as needed -Cont metformin at discharge   CKD stage II Cr currently stable compared to prior renal panels over the past several months -recheck bmet in AM   History of stroke -Continue home Lipitor and aspirin   Depression -Continue home Zoloft   OSA -Nonadherent with CPAP  Leukocytosis -WBC of 17.1k on 12/1 -Afebrile -UA clear -Presenting CXR with findings of patchy opacities. Started empiric azythro and rocephin -WBC has normalized. Would treat for total 5 days  Hypomagnesemia -replaced  Hypokalemia -replaced   Subjective: Complaining of burning while receiving Mg infusion  Physical Exam: Vitals:   11/18/23 1948 11/19/23 0439 11/19/23 0805 11/19/23 1644  BP: (!) 137/102 120/79 (!) 156/105 (!) 158/92  Pulse:  77 83 78  Resp: 18 18 16  16  Temp: 98.2 F (36.8 C) 97.7 F (36.5 C)  97.6 F (36.4 C)  TempSrc: Oral Oral  Oral  SpO2: 98% 97%  100%  Weight:  70.9 kg    Height:       General exam: Awake, laying in bed, in nad Respiratory system: Normal respiratory effort, no wheezing Cardiovascular system: regular rate, s1,  s2 Gastrointestinal system: Soft, nondistended, positive BS Central nervous system: CN2-12 grossly intact, strength intact Extremities: Perfused, no clubbing Skin: Normal skin turgor, no notable skin lesions seen Psychiatry: Mood normal // no visual hallucinations   Data Reviewed: Labs reviewed: Na 137, K 3.6, Cr 1.57, WBC 8.7, Hgb 14.2, Plts 256  Family Communication: Pt in room, family not at bedside  Disposition: Status is: Inpatient Remains inpatient appropriate because: Severity of illness  Planned Discharge Destination: Home    Author: Rickey Barbara, MD 11/19/2023 4:58 PM  For on call review [email or web address].

## 2023-11-19 NOTE — Progress Notes (Addendum)
Advanced Heart Failure Rounding Note  PCP-Cardiologist: None   Subjective:    Diuresed well with IV lasix. SCr trending up. Weight down 4lbs.   Feels fine this morning just asking why he's so fidgety.   Objective:   Weight Range: 70.9 kg Body mass index is 23.08 kg/m.   Vital Signs:   Temp:  [97.7 F (36.5 C)-98.2 F (36.8 C)] 97.7 F (36.5 C) (12/03 0439) Pulse Rate:  [77-85] 83 (12/03 0805) Resp:  [16-18] 16 (12/03 0805) BP: (120-156)/(79-105) 156/105 (12/03 0805) SpO2:  [97 %-98 %] 97 % (12/03 0439) Weight:  [70.9 kg] 70.9 kg (12/03 0439) Last BM Date : 11/16/23  Weight change: Filed Weights   11/17/23 0500 11/18/23 0603 11/19/23 0439  Weight: 73.4 kg 72.9 kg 70.9 kg    Intake/Output:   Intake/Output Summary (Last 24 hours) at 11/19/2023 0907 Last data filed at 11/19/2023 0734 Gross per 24 hour  Intake 1062 ml  Output 890 ml  Net 172 ml    Physical Exam  General:  restless appearing.  No respiratory difficulty HEENT: normal Neck: supple. JVD flat. Carotids 2+ bilat; no bruits. No lymphadenopathy or thyromegaly appreciated. Cor: PMI nondisplaced. Regular rate & rhythm. No rubs, gallops or murmurs. Lungs: clear Abdomen: soft, nontender, nondistended. No hepatosplenomegaly. No bruits or masses. Good bowel sounds. Extremities: no cyanosis, clubbing, rash, edema  Neuro: alert & oriented x 3, cranial nerves grossly intact. moves all 4 extremities w/o difficulty. Affect pleasant.   Telemetry   NSR 80s (Personally reviewed)    EKG    No new EKG to review  Labs    CBC Recent Labs    11/16/23 1520 11/16/23 1526 11/17/23 1039 11/18/23 0640  WBC 8.9  --  17.1* 8.7  NEUTROABS 8.2*  --   --   --   HGB 15.4   < > 15.8 14.2  HCT 45.9   < > 46.2 42.2  MCV 89.0  --  84.6 86.8  PLT 234  --  287 256   < > = values in this interval not displayed.   Basic Metabolic Panel Recent Labs    23/55/73 0640 11/19/23 0425  NA 137 136  K 3.6 3.5  CL 102 97*   CO2 25 27  GLUCOSE 107* 192*  BUN 27* 29*  CREATININE 1.57* 1.70*  CALCIUM 8.6* 8.7*  MG  --  1.8   Liver Function Tests Recent Labs    11/18/23 0640  AST 16  ALT 19  ALKPHOS 65  BILITOT 0.5  PROT 5.9*  ALBUMIN 2.7*   No results for input(s): "LIPASE", "AMYLASE" in the last 72 hours. Cardiac Enzymes No results for input(s): "CKTOTAL", "CKMB", "CKMBINDEX", "TROPONINI" in the last 72 hours.  BNP: BNP (last 3 results) Recent Labs    07/04/23 1136 11/13/23 1454 11/16/23 1520  BNP 47.0 372.4* 788.9*    ProBNP (last 3 results) No results for input(s): "PROBNP" in the last 8760 hours.   D-Dimer No results for input(s): "DDIMER" in the last 72 hours. Hemoglobin A1C Recent Labs    11/17/23 1829  HGBA1C 6.2*   Fasting Lipid Panel No results for input(s): "CHOL", "HDL", "LDLCALC", "TRIG", "CHOLHDL", "LDLDIRECT" in the last 72 hours. Thyroid Function Tests No results for input(s): "TSH", "T4TOTAL", "T3FREE", "THYROIDAB" in the last 72 hours.  Invalid input(s): "FREET3"  Other results:  Imaging   No results found.  Medications:   Scheduled Medications:  aspirin EC  81 mg Oral Daily  atorvastatin  80 mg Oral Daily   azithromycin  500 mg Oral Q24H   enoxaparin (LOVENOX) injection  40 mg Subcutaneous Q24H   ezetimibe  10 mg Oral Daily   furosemide  80 mg Intravenous BID   insulin aspart  0-15 Units Subcutaneous TID WC   insulin aspart  0-5 Units Subcutaneous QHS   isosorbide-hydrALAZINE  1.5 tablet Oral BID   losartan  50 mg Oral Daily   sertraline  100 mg Oral Daily   spironolactone  25 mg Oral Daily    Infusions:  cefTRIAXone (ROCEPHIN)  IV 2 g (11/18/23 2113)    PRN Medications: acetaminophen **OR** acetaminophen, melatonin, ondansetron (ZOFRAN) IV  Patient Profile   Matthew [name]. is a 57 y.o. with PMH significant for combined systolic CHF with biventricular failure, T2DM, HTN, OSA, tobacco use, HLD, cocaine use, poor medication  adherence, depression,  CVA, and CKD 3a.   AHF team to see for A/C systolic heart failure.   Assessment/Plan   1. Acute on chronic systolic CHF: Nonischemic cardiomyopathy.  Due to poorly controlled HTN and cocaine abuse.  Normal coronaries on 2015 cath.  Echo in 1/22 showed EF < 20% with moderate RV dysfunction (off meds, + cocaine).  Echo in 5/22 with EF up to 50% (on meds and off cocaine). Echo 1/23 (off meds and on cocaine) EF down to 30-35%. Echo 7/24 EF 25-30%, GIDD, RV normal, LA mildly dilated, small pericardial effusion present. Echo this admission with EF 25-30%, no thrombus seen but swirling of contrast noted, LV with GHK, RV normal, LA severely dilated, small pericardial effusion, trivial MR.   - He was admitted with hypertensive urgency in setting of cocaine use, not compliant with home meds. He has been diuresed, I/Os not accurate but weight down. Creatinine up at 1.7. Mild volume overload on exam.  - Appears euvolemic. Got morning dose of lasix. Will hold further diuretics today and plan to start PO tomorrow. Would start 40 lasix daily - Stopped Entresto because it caused headaches, does not want to restart.  - Continue losartan 50 mg daily.  - Continue spironolactone 25 mg daily.  - Off Coreg with active cocaine use.  - Increase Bidil to 1.5>2 tab tid.  - Did not tolerate Farxiga due to dizziness  - Not candidate for ICD or advanced therapies with active cocaine abuse.  2. HTN: BP remains elevated, increasing Bidil as above.    3. OSA: Not compliant with CPAP.  4. Tobacco Use Disorder: He has quit smoking.  5. Cocaine Use Disorder:  He has restarted using cocaine.  Urged him to quit.  6. CKD 3: Creatinine stable at 1.7 today.  7. Hyperlipidemia: Continue atorvastatin.  8. Depression: Controlled.  9. DM2: Per primary 10. 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  Length of Stay: 3  Matthew Bleacher, NP  11/19/2023, 9:07  AM  Advanced Heart Failure Team Pager [phone] (M-F; 7a - 5p)  Please contact [facility] for night-coverage after hours (5p -7a ) and weekends on [email or web address]   Patient seen with NP, agree with the above note.   BP still high at times.  Weight down with diuresis though I/Os not accurate (down 4 lbs over the last day).  Creatinine mildly higher at 1.7.   General: NAD Neck: No JVD, no thyromegaly or thyroid nodule.  Lungs: Clear to auscultation bilaterally with normal respiratory effort. CV: Lateral PMI.  Heart regular S1/S2, no S3/S4, no murmur.  No peripheral edema.   Abdomen: Soft, nontender, no hepatosplenomegaly, no distention.  Skin: Intact without lesions or rashes.  Neurologic: Alert and oriented x 3.  Psych: Normal affect. Extremities: No clubbing or cyanosis.  HEENT: Normal.   He looks near-euvolemic at this point.  Had IV Lasix this morning.  Will switch to Lasix 40 mg po daily.   Increase Bidil to 2 tabs tid, continue losartan 50 mg daily and spironolactone 25 daily.  Has not tolerated Entresto or [place].  I am leery about starting Coreg as I am not sure that he is ready to stop using cocaine.   I think he may be ready for discharge tomorrow.   Strongly urged that he stop cocaine use.   Matthew [name] 11/19/2023 12:48 PM

## 2023-11-19 NOTE — Care Management Important Message (Signed)
Important Message  Patient Details  Name: Matthew [name]. MRN: [id number] Date of Birth: Jul 14, 1966   Important Message Given:  Yes - Medicare IM     Sherilyn Banker 11/19/2023, 10:26 AM

## 2023-11-20 ENCOUNTER — Other Ambulatory Visit: Payer: Self-pay

## 2023-11-20 ENCOUNTER — Other Ambulatory Visit (HOSPITAL_COMMUNITY): Payer: Self-pay

## 2023-11-20 DIAGNOSIS — F332 Major depressive disorder, recurrent severe without psychotic features: Secondary | ICD-10-CM

## 2023-11-20 DIAGNOSIS — E782 Mixed hyperlipidemia: Secondary | ICD-10-CM | POA: Diagnosis not present

## 2023-11-20 DIAGNOSIS — F411 Generalized anxiety disorder: Secondary | ICD-10-CM | POA: Diagnosis not present

## 2023-11-20 DIAGNOSIS — J9601 Acute respiratory failure with hypoxia: Secondary | ICD-10-CM | POA: Diagnosis not present

## 2023-11-20 DIAGNOSIS — I160 Hypertensive urgency: Secondary | ICD-10-CM

## 2023-11-20 DIAGNOSIS — N182 Chronic kidney disease, stage 2 (mild): Secondary | ICD-10-CM

## 2023-11-20 DIAGNOSIS — E1121 Type 2 diabetes mellitus with diabetic nephropathy: Secondary | ICD-10-CM

## 2023-11-20 DIAGNOSIS — I5043 Acute on chronic combined systolic (congestive) and diastolic (congestive) heart failure: Secondary | ICD-10-CM

## 2023-11-20 DIAGNOSIS — F1490 Cocaine use, unspecified, uncomplicated: Secondary | ICD-10-CM | POA: Diagnosis not present

## 2023-11-20 DIAGNOSIS — F0631 Mood disorder due to known physiological condition with depressive features: Secondary | ICD-10-CM

## 2023-11-20 DIAGNOSIS — I10 Essential (primary) hypertension: Secondary | ICD-10-CM

## 2023-11-20 DIAGNOSIS — G4733 Obstructive sleep apnea (adult) (pediatric): Secondary | ICD-10-CM

## 2023-11-20 LAB — BASIC METABOLIC PANEL
Anion gap: 11 (ref 5–15)
BUN: 33 mg/dL — ABNORMAL HIGH (ref 6–20)
CO2: 26 mmol/L (ref 22–32)
Calcium: 8.8 mg/dL — ABNORMAL LOW (ref 8.9–10.3)
Chloride: 100 mmol/L (ref 98–111)
Creatinine, Ser: 1.77 mg/dL — ABNORMAL HIGH (ref 0.61–1.24)
GFR, Estimated: 45 mL/min — ABNORMAL LOW (ref >60)
Glucose, Bld: 97 mg/dL (ref 70–99)
Potassium: 4 mmol/L (ref 3.5–5.1)
Sodium: 137 mmol/L (ref 135–145)

## 2023-11-20 LAB — GLUCOSE, CAPILLARY: Glucose-Capillary: 162 mg/dL — ABNORMAL HIGH (ref 70–99)

## 2023-11-20 LAB — LEGIONELLA PNEUMOPHILA SEROGP 1 UR AG: L. pneumophila Serogp 1 Ur Ag: NEGATIVE

## 2023-11-20 MED ORDER — SPIRONOLACTONE 25 MG PO TABS
25.0000 mg | ORAL_TABLET | Freq: Every day | ORAL | 1 refills | Status: DC
  Filled 2023-11-20: qty 90, 90d supply, fill #0

## 2023-11-20 MED ORDER — EZETIMIBE 10 MG PO TABS
10.0000 mg | ORAL_TABLET | Freq: Every day | ORAL | 0 refills | Status: DC
  Filled 2023-11-20: qty 90, 90d supply, fill #0

## 2023-11-20 MED ORDER — ATORVASTATIN CALCIUM 80 MG PO TABS
80.0000 mg | ORAL_TABLET | Freq: Every day | ORAL | 1 refills | Status: DC
  Filled 2023-11-20: qty 90, 90d supply, fill #0

## 2023-11-20 MED ORDER — BIDIL 20-37.5 MG PO TABS
2.0000 | ORAL_TABLET | Freq: Three times a day (TID) | ORAL | 2 refills | Status: DC
  Filled 2023-11-20: qty 148, 25d supply, fill #0

## 2023-11-20 MED ORDER — FUROSEMIDE 40 MG PO TABS
40.0000 mg | ORAL_TABLET | Freq: Every day | ORAL | 3 refills | Status: DC
  Filled 2023-11-20 – 2023-11-26 (×2): qty 90, 90d supply, fill #0

## 2023-11-20 MED ORDER — ACETAMINOPHEN 325 MG PO TABS: 650.0000 mg | ORAL_TABLET | Freq: Four times a day (QID) | ORAL | Status: DC | PRN

## 2023-11-20 MED ORDER — LOSARTAN POTASSIUM 50 MG PO TABS
50.0000 mg | ORAL_TABLET | Freq: Once | ORAL | Status: AC
  Administered 2023-11-20: 50 mg via ORAL
  Filled 2023-11-20: qty 1

## 2023-11-20 MED ORDER — AZITHROMYCIN 250 MG PO TABS
250.0000 mg | ORAL_TABLET | Freq: Every day | ORAL | 0 refills | Status: AC
  Filled 2023-11-20: qty 2, 2d supply, fill #0

## 2023-11-20 MED ORDER — LOSARTAN POTASSIUM 50 MG PO TABS: 100.0000 mg | ORAL_TABLET | Freq: Every day | ORAL | Status: DC

## 2023-11-20 MED ORDER — LOSARTAN POTASSIUM 100 MG PO TABS
100.0000 mg | ORAL_TABLET | Freq: Every day | ORAL | 0 refills | Status: DC
  Filled 2023-11-20: qty 90, 90d supply, fill #0

## 2023-11-20 MED ORDER — CEFDINIR 300 MG PO CAPS
300.0000 mg | ORAL_CAPSULE | Freq: Two times a day (BID) | ORAL | 0 refills | Status: AC
  Filled 2023-11-20: qty 4, 2d supply, fill #0

## 2023-11-20 MED ORDER — CEFDINIR 300 MG PO CAPS
300.0000 mg | ORAL_CAPSULE | Freq: Two times a day (BID) | ORAL | Status: DC
  Administered 2023-11-20: 300 mg via ORAL
  Filled 2023-11-20 (×2): qty 1

## 2023-11-20 NOTE — Care Management (Signed)
Follow up appointment made with PCP   Gaspar Garbe Glennon Mac, RN #  1.  S/W   JASON @ [facility]   # [phone]   FOR APPT :  TIME : 2:30 PM    ARRIVE AT 2:15 PM DATE : DECEMBER 23 , 2024 DOCTOR : McCLUNG, ANGELA LOCATION : [id number]  PT'S PRIMARY PCP IS NEWLIN,ENOBONG

## 2023-11-20 NOTE — Progress Notes (Signed)
Discharge instructions given. Patient verbalized understanding and all questions were answered.  ?

## 2023-11-20 NOTE — Progress Notes (Addendum)
Advanced Heart Failure Rounding Note  PCP-Cardiologist: None   Subjective:    Fatigued. Kept getting woken up last night. Wants to go home.   Objective:   Weight Range: 71.2 kg Body mass index is 23.18 kg/m.   Vital Signs:   Temp:  [97.6 F (36.4 C)-98.3 F (36.8 C)] 98.3 F (36.8 C) (12/04 0744) Pulse Rate:  [70-83] 73 (12/04 0744) Resp:  [16-18] 18 (12/04 0744) BP: (112-158)/(76-98) 136/91 (12/04 0744) SpO2:  [94 %-100 %] 94 % (12/04 0744) Weight:  [71.2 kg] 71.2 kg (12/04 0449) Last BM Date : 11/18/23  Weight change: Filed Weights   11/18/23 0603 11/19/23 0439 11/20/23 0449  Weight: 72.9 kg 70.9 kg 71.2 kg    Intake/Output:   Intake/Output Summary (Last 24 hours) at 11/20/2023 0951 Last data filed at 11/20/2023 0744 Gross per 24 hour  Intake 2358.64 ml  Output 1500 ml  Net 858.64 ml    Physical Exam  General:  Appears restless but no distress HEENT: normal Neck: supple. no JVD. Carotids 2+ bilat; no bruits.  Cor: PMI nondisplaced. Regular rate & rhythm. No rubs, gallops or murmurs. Lungs: clear Abdomen: soft, nontender, nondistended.  Extremities: no cyanosis, clubbing, rash, edema Neuro: alert & orientedx3. moves all 4 extremities w/o difficulty. Affect pleasant   Telemetry   NSR 70s  EKG    No new EKG to review  Labs    CBC Recent Labs    11/17/23 1039 11/18/23 0640  WBC 17.1* 8.7  HGB 15.8 14.2  HCT 46.2 42.2  MCV 84.6 86.8  PLT 287 256   Basic Metabolic Panel Recent Labs    47/42/59 0425 11/20/23 0439  NA 136 137  K 3.5 4.0  CL 97* 100  CO2 27 26  GLUCOSE 192* 97  BUN 29* 33*  CREATININE 1.70* 1.77*  CALCIUM 8.7* 8.8*  MG 1.8  --    Liver Function Tests Recent Labs    11/18/23 0640  AST 16  ALT 19  ALKPHOS 65  BILITOT 0.5  PROT 5.9*  ALBUMIN 2.7*   No results for input(s): "LIPASE", "AMYLASE" in the last 72 hours. Cardiac Enzymes No results for input(s): "CKTOTAL", "CKMB", "CKMBINDEX", "TROPONINI" in the  last 72 hours.  BNP: BNP (last 3 results) Recent Labs    07/04/23 1136 11/13/23 1454 11/16/23 1520  BNP 47.0 372.4* 788.9*    ProBNP (last 3 results) No results for input(s): "PROBNP" in the last 8760 hours.   D-Dimer No results for input(s): "DDIMER" in the last 72 hours. Hemoglobin A1C Recent Labs    11/17/23 1829  HGBA1C 6.2*   Fasting Lipid Panel No results for input(s): "CHOL", "HDL", "LDLCALC", "TRIG", "CHOLHDL", "LDLDIRECT" in the last 72 hours. Thyroid Function Tests No results for input(s): "TSH", "T4TOTAL", "T3FREE", "THYROIDAB" in the last 72 hours.  Invalid input(s): "FREET3"  Other results:  Imaging   No results found.  Medications:   Scheduled Medications:  aspirin EC  81 mg Oral Daily   atorvastatin  80 mg Oral Daily   azithromycin  500 mg Oral Q24H   enoxaparin (LOVENOX) injection  40 mg Subcutaneous Q24H   ezetimibe  10 mg Oral Daily   furosemide  40 mg Oral Daily   insulin aspart  0-15 Units Subcutaneous TID WC   insulin aspart  0-5 Units Subcutaneous QHS   isosorbide-hydrALAZINE  2 tablet Oral TID   losartan  50 mg Oral Daily   sertraline  100 mg Oral Daily  spironolactone  25 mg Oral Daily    Infusions:  cefTRIAXone (ROCEPHIN)  IV Stopped (11/19/23 2117)    PRN Medications: acetaminophen **OR** acetaminophen, melatonin, ondansetron (ZOFRAN) IV  Patient Profile   Matthew [name]. is a 57 y.o. with PMH significant for combined systolic CHF with biventricular failure, T2DM, HTN, OSA, tobacco use, HLD, cocaine use, poor medication adherence, depression,  CVA, and CKD 3a.   AHF team to see for A/C systolic heart failure.   Assessment/Plan   1. Acute on chronic systolic CHF: Nonischemic cardiomyopathy.  Due to poorly controlled HTN and cocaine abuse.  Normal coronaries on 2015 cath.  Echo in 1/22 showed EF < 20% with moderate RV dysfunction (off meds, + cocaine).  Echo in 5/22 with EF up to 50% (on meds and off cocaine). Echo 1/23  (off meds and on cocaine) EF down to 30-35%. Echo 7/24 EF 25-30%, GIDD, RV normal, LA mildly dilated, small pericardial effusion present. Echo this admission with EF 25-30%, no thrombus seen but swirling of contrast noted, LV with GHK, RV normal, LA severely dilated, small pericardial effusion, trivial MR.   - He was admitted with hypertensive urgency in setting of cocaine use, not compliant with home meds.  - Appears euvolemic. Stop IV lasix. Start 40 mg lasix daily. - Stopped Entresto because it caused headaches, does not want to restart.  - Increase Losartan to 100 mg daily - Continue spironolactone 25 mg daily.  - Off Coreg with active cocaine use.  - Continue bidil 2 tab tid.  - Did not tolerate Farxiga due to dizziness  - Not candidate for ICD or advanced therapies with active cocaine abuse.  2. HTN: BP remains elevated, increasing Losartan as above   3. OSA: Not compliant with CPAP.  4. Tobacco Use Disorder: He has quit smoking.  5. Cocaine Use Disorder:  He has restarted using cocaine.  Urged him to quit.  6. CKD 3: Creatinine stable at 1.77 today.  7. Hyperlipidemia: Continue atorvastatin.  8. Depression: Controlled.  9. DM2: Per primary 10. 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  Has f/u in HF clinic scheduled.     Length of Stay: 4  FIN[facility], LINDSAY N, PA-C  11/20/2023, 9:51 AM  Advanced Heart Failure Team Pager [phone] (M-F; 7a - 5p)  Please contact [facility] for night-coverage after hours (5p -7a ) and weekends on [email or web address]   Patient seen with PA, agree with the above note.   He denies dyspnea this morning. Fidgety. Wants to go home.   Creatinine stable 1.77.   General: NAD Neck: No JVD, no thyromegaly or thyroid nodule.  Lungs: Clear to auscultation bilaterally with normal respiratory effort. CV: Nondisplaced PMI.  Heart regular S1/S2, no S3/S4, no murmur.  No peripheral edema.  Abdomen:  Soft, nontender, no hepatosplenomegaly, no distention.  Skin: Intact without lesions or rashes.  Neurologic: Alert and oriented x 3.  Psych: Normal affect. Extremities: No clubbing or cyanosis.  HEENT: Normal.   He looks near-euvolemic at this point.  Now on po Lasix.  Creatinine up from baseline at 1.77 but stable from yesterday.    Continue Bidil 2 tabs tid and spironolactone 25 daily.  Increase losartan to 100 mg daily with ongoing elevated BP at times.  Has not tolerated Entresto or [place].  I am leery about starting Coreg as I am not sure that he is ready to stop using cocaine.  Strongly urged that he stop cocaine use.   I think he can go home today, will need CHF clinic followup.  Cardiac meds for home: losartan 100 mg daily, spironolactone 25 daily, Lasix 40 mg po daily, Bidil 2 tabs tid, ASA 81, atorvastatin 80, Zetia 10 daily.   Marca Ancona 11/20/2023 11:17 AM

## 2023-11-20 NOTE — Discharge Summary (Signed)
Physician Discharge Summary  Matthew [name]. ZOX:[id number] DOB: 1966-11-20 DOA: 11/16/2023  PCP: [name], MD  Admit date: 11/16/2023 Discharge date: 11/20/2023  Time spent: 60 minutes  Recommendations for Outpatient Follow-up:  Follow-up with [facility] specialty clinics on 12/02/2023 at 12:15 PM Follow-up with [name], MD/[place], [place] on December 09, 2023.  On follow-up patient will need a basic metabolic profile done to follow-up on electrolytes and renal function.  Patient's diabetes will need to be reassessed on follow-up as well as blood pressure.    Discharge Diagnoses:  Principal Problem:   Acute hypoxic respiratory failure (HCC) Active Problems:   Hypertension   CKD (chronic kidney disease) stage 2, GFR 60-89 ml/min   Diabetes mellitus type II, controlled (HCC)   OSA (obstructive sleep apnea)   Mixed hyperlipidemia   Depression due to physical illness   Acute on chronic combined systolic and diastolic CHF (congestive heart failure) (HCC)   Hypertensive urgency   Decompensated heart failure (HCC)   MDD (major depressive disorder), recurrent episode (HCC)   GAD (generalized anxiety disorder)   Acute pulmonary edema (HCC)   Cocaine use   Discharge Condition: Stable and improved  Diet recommendation: Heart healthy  Filed Weights   11/18/23 0603 11/19/23 0439 11/20/23 0449  Weight: 72.9 kg 70.9 kg 71.2 kg    History of present illness:  HPI per Dr. Austin Miles 11/16/2023 Matthew [name]. is a 57 y.o. 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   Patient is a difficult historian, unable to elicit much information from him.  He states that he suddenly became short of  breath this morning and this did not resolve.  It became progressively worse.  States that this has happened before.  He does endorse cocaine use over the past 2 days, last use yesterday.  He states that he does take his home medications regularly, although unable to tell me which medications he is currently on.  He denies any headaches, vision changes, nausea, fevers, cough, chest pain, palpitations, abdominal pain, diarrhea, urinary changes.   First responders noted that patient had O2 sats in the 80s on room air.  They placed patient on nonrebreather and gave him albuterol, Solu-Medrol, nitroglycerin en route to [facility] ED.   ED course: Patient placed on BiPAP on arrival. CBC unremarkable.  BMP with bicarb 20, glucose 142, creatinine 1.17 (around his baseline).  BNP 789.  Negative for alcohol.  Urine drug screen positive for cocaine use.  Troponins 34 > 37. Lactic acid initially normal but still mildly elevated on recheck at 2.0.  I-STAT ABG unremarkable.  Chest x-ray showing likely pulmonary edema in R hemithorax.  Patient given IV Lasix and started on nitroglycerin drip per ED provider.  [facility] hospitalist asked to evaluate patient for admission.   Hospital Course:  #1 acute hypoxic respiratory failure secondary to acute pulmonary edema/decompensated combined systolic diastolic CHF/cardiomyopathy -Patient noted to have presented with acute hypoxic respiratory failure felt likely secondary to volume overload in the setting of active cocaine abuse and some medication noncompliance. -2D echo done with a EF of 25 to 30%, grade 2 DD, severely dilated left atrial size, small pericardial effusion noted. -Patient was seen in consultation by cardiology and followed by the advanced heart failure  team throughout the hospitalization. -Patient initially placed on Lasix IV with good diuresis and subsequently transition to oral Lasix on day of discharge. -Cardiology recommended increasing patient's BiDil to 2 tabs 3  times daily, continuation of home regimen of losartan 50 mg daily, spironolactone 25 mg daily. -Patient now placed on Coreg due to concern for ongoing cocaine abuse. -As noted per cardiology that patient in the past has not tolerated Entresto or Farxiga. -Patient improved clinically such that by day of discharge patient with sats of 100% on room air. -Cocaine cessation stressed to patient as well as medication compliance. -Patient cleared by cardiology for discharge and will follow-up at CHF clinic. -Patient be discharged in stable and improved condition.  2.  Hypertensive emergency versus chronic uncontrolled hypertension -On admission patient initially placed on nitroglycerin drip which was subsequently weaned off. -Patient was placed on losartan 50 mg daily and dose uptitrated to 100 mg daily per cardiology recommendations, patient placed on spironolactone 25 mg daily as well as BiDil increased to 2 tabs 3 times daily as well as diuretics with better blood pressure control. -Outpatient follow-up.  3.  Type II non-STEMI -Felt likely secondary to problem #1 in the setting of cocaine use. -Cocaine cessation stressed to patient.  4.  Polysubstance use/cocaine use disorder -Felt to be playing a major role in his underlying heart failure and cardiomyopathy. -Cessation stressed to patient. -TOC consulted for substance use resources.  5.  Diabetes mellitus type 2 -Hemoglobin A1c at 6.2. -Oral hypoglycemic agents of metformin was held during the hospitalization and patient maintained on SSI. -Metformin will be resumed on discharge.  6.  CKD stage II -Remained relatively stable during the hospitalization. -Outpatient follow-up.  7.  History of CVA -Patient maintained on home regimen of aspirin and Lipitor for secondary stroke prophylaxis. -Risk factor modification stressed to patient.  8.  Depression -Patient maintained on home regimen Zoloft.  9.  OSA -Patient noted to be nonadherent  with CPAP.  10.  Leukocytosis -Patient noted to have a leukocytosis of 7013.1 on 11/17/2023. -Patient remained afebrile. -Urinalysis unremarkable. -Chest x-ray on admission noted some patchy opacities patient started on empiric azithromycin and Rocephin with normalization of WBC. -Patient will discharge on 2 more days of Omnicef as well as azithromycin to complete an empiric 5-day course of antibiotic treatment.  11.  Hypomagnesemia/hypokalemia -Repleted during the hospitalization.  Procedures: Chest x-ray 11/16/2023 2D echo 11/17/2023   Consultations: Cardiology: Dr. Allena Katz 11/16/2023  Discharge Exam: Vitals:   11/20/23 0449 11/20/23 0744  BP: (!) 130/98 (!) 136/91  Pulse: 70 73  Resp: 18 18  Temp: 97.7 F (36.5 C) 98.3 F (36.8 C)  SpO2: 100% 94%    General: NAD Cardiovascular: RRR no murmurs rubs or gallops.  No JVD.  No lower extremity edema. Respiratory: Clear to auscultation bilaterally.  No wheezes, no crackles, no rhonchi.  Fair air movement.  Speaking in full sentences.  Discharge Instructions   Discharge Instructions     Diet - low sodium heart healthy   Complete by: As directed    Increase activity slowly   Complete by: As directed       Allergies as of 11/20/2023   No Known Allergies      Medication List     STOP taking these medications    carvedilol 6.25 MG tablet Commonly known as: COREG   hydrOXYzine 25 MG tablet Commonly known as: ATARAX       TAKE these medications    acetaminophen 325  MG tablet Commonly known as: TYLENOL Take 2 tablets (650 mg total) by mouth every 6 (six) hours as needed for mild pain (pain score 1-3) (or Fever >/= 101).   aspirin EC 81 MG tablet Take 1 tablet (81 mg total) by mouth daily. Swallow whole.   atorvastatin 80 MG tablet Commonly known as: LIPITOR Take 1 tablet (80 mg total) by mouth daily.   azithromycin 250 MG tablet Commonly known as: ZITHROMAX Take 1 tablet (250 mg total) by mouth daily  for 2 days.   BiDil 20-37.5 MG tablet Generic drug: isosorbide-hydrALAZINE Take 2 tablets by mouth 3 (three) times daily. What changed: how much to take   cefdinir 300 MG capsule Commonly known as: OMNICEF Take 1 capsule (300 mg total) by mouth every 12 (twelve) hours for 2 days.   ezetimibe 10 MG tablet Commonly known as: ZETIA Take 1 tablet (10 mg total) by mouth daily.   furosemide 40 MG tablet Commonly known as: Lasix Take 1 tablet (40 mg total) by mouth daily.   gabapentin 300 MG capsule Commonly known as: NEURONTIN Take 1 capsule (300 mg total) by mouth at bedtime.   losartan 100 MG tablet Commonly known as: COZAAR Take 1 tablet (100 mg total) by mouth daily. Start taking on: November 21, 2023   melatonin 5 MG Tabs Take 1 tablet (5 mg total) by mouth at bedtime.   metFORMIN 500 MG 24 hr tablet Commonly known as: GLUCOPHAGE-XR Take 1 tablet (500 mg total) by mouth daily with breakfast.   sertraline 100 MG tablet Commonly known as: ZOLOFT Take 1 tablet (100 mg total) by mouth daily.   spironolactone 25 MG tablet Commonly known as: ALDACTONE Take 1 tablet (25 mg total) by mouth daily.   timolol 0.5 % ophthalmic solution Commonly known as: TIMOPTIC Place 1 drop into both eyes daily after breakfast.       No Known Allergies  Follow-up Information     [facility] Follow up on 12/02/2023.   Specialty: Cardiology Why: Follow up in the Advanced Heart Failure Clinic 12/16 at 12 pm Contact information: [address] [place] [address] [phone]        Anders Simmonds, PA-C Follow up.   Specialty: Family Medicine Why: TIME : 2:30 PM  /  ARRIVE  AT 2:15 PM DATE : DECEMBER 23,2024 Contact information: [address] [place] [address] [phone]                  The results of significant diagnostics from this hospitalization (including imaging, microbiology,  ancillary and laboratory) are listed below for reference.    Significant Diagnostic Studies: ECHOCARDIOGRAM COMPLETE  Result Date: 11/17/2023    ECHOCARDIOGRAM REPORT   Patient Name:   Britain Ellena. Date of Exam: 11/17/2023 Medical Rec #:  [id number]           Height:       69.0 in Accession #:    [id number]          Weight:       161.8 lb Date of Birth:  [birth year]           BSA:          1.888 m Patient Age:    57 years            BP:           154/115 mmHg Patient Gender: M  HR:           80 bpm. Exam Location:  Inpatient Procedure: 2D Echo, Cardiac Doppler and Color Doppler Indications:    acute systolic chf  History:        Patient has prior history of Echocardiogram examinations, most                 recent 07/04/2023. Chronic kidney disease; Risk Factors:Current                 Smoker, Cocaine use, Hypertension, Dyslipidemia, Sleep Apnea and                 Diabetes.  Sonographer:    Delcie Roch RDCS Referring Phys: [id number] SAGAR H JINWALA IMPRESSIONS  1. Spontaneous contrast noted in the LV apex with contrast swirling indicating slow flow and high-risk for thrombus formation, but no definite thrombus seen. Left ventricular ejection fraction, by estimation, is 25 to 30%. The left ventricle has severely decreased function. The left ventricle demonstrates global hypokinesis. The left ventricular internal cavity size was severely dilated. Left ventricular diastolic parameters are consistent with Grade II diastolic dysfunction (pseudonormalization).  2. Right ventricular systolic function is normal. The right ventricular size is normal. Tricuspid regurgitation signal is inadequate for assessing PA pressure.  3. Left atrial size was severely dilated.  4. A small pericardial effusion is present. The pericardial effusion is anterior to the right ventricle.  5. The mitral valve is grossly normal. Trivial mitral valve regurgitation. No evidence of mitral stenosis.  6. The aortic valve is  tricuspid. Aortic valve regurgitation is not visualized. No aortic stenosis is present.  7. The inferior vena cava is normal in size with greater than 50% respiratory variability, suggesting right atrial pressure of 3 mmHg. Comparison(s): No significant change from prior study. FINDINGS  Left Ventricle: Spontaneous contrast noted in the LV apex with contrast swirling indicating slow flow and high-risk for thrombus formation, but no definite thrombus seen. Left ventricular ejection fraction, by estimation, is 25 to 30%. The left ventricle has severely decreased function. The left ventricle demonstrates global hypokinesis. The left ventricular internal cavity size was severely dilated. There is no left ventricular hypertrophy. Left ventricular diastolic parameters are consistent with Grade II diastolic dysfunction (pseudonormalization). Right Ventricle: The right ventricular size is normal. No increase in right ventricular wall thickness. Right ventricular systolic function is normal. Tricuspid regurgitation signal is inadequate for assessing PA pressure. Left Atrium: Left atrial size was severely dilated. Right Atrium: Right atrial size was normal in size. Pericardium: A small pericardial effusion is present. The pericardial effusion is anterior to the right ventricle. Mitral Valve: The mitral valve is grossly normal. Trivial mitral valve regurgitation. No evidence of mitral valve stenosis. Tricuspid Valve: The tricuspid valve is grossly normal. Tricuspid valve regurgitation is trivial. No evidence of tricuspid stenosis. Aortic Valve: The aortic valve is tricuspid. Aortic valve regurgitation is not visualized. No aortic stenosis is present. Pulmonic Valve: The pulmonic valve was grossly normal. Pulmonic valve regurgitation is not visualized. No evidence of pulmonic stenosis. Aorta: The aortic root and ascending aorta are structurally normal, with no evidence of dilitation. Venous: The inferior vena cava is normal in  size with greater than 50% respiratory variability, suggesting right atrial pressure of 3 mmHg. IAS/Shunts: The atrial septum is grossly normal.  LEFT VENTRICLE PLAX 2D LVIDd:         7.00 cm      Diastology LVIDs:  6.20 cm      LV e' medial:    4.68 cm/s LV PW:         1.10 cm      LV E/e' medial:  12.1 LV IVS:        1.00 cm      LV e' lateral:   7.18 cm/s LVOT diam:     2.50 cm      LV E/e' lateral: 7.9 LV SV:         47 LV SV Index:   25 LVOT Area:     4.91 cm  LV Volumes (MOD) LV vol d, MOD A4C: 221.0 ml LV vol s, MOD A4C: 179.0 ml LV SV MOD A4C:     221.0 ml RIGHT VENTRICLE             IVC RV Basal diam:  3.10 cm     IVC diam: 1.70 cm RV S prime:     15.30 cm/s TAPSE (M-mode): 2.4 cm LEFT ATRIUM              Index        RIGHT ATRIUM           Index LA diam:        5.40 cm  2.86 cm/m   RA Area:     16.30 cm LA Vol (A2C):   112.0 ml 59.32 ml/m  RA Volume:   48.20 ml  25.53 ml/m LA Vol (A4C):   110.0 ml 58.26 ml/m LA Biplane Vol: 115.0 ml 60.91 ml/m  AORTIC VALVE LVOT Vmax:   62.90 cm/s LVOT Vmean:  39.200 cm/s LVOT VTI:    0.096 m  AORTA Ao Root diam: 3.60 cm Ao Asc diam:  3.80 cm MITRAL VALVE MV Area (PHT): 3.72 cm    SHUNTS MV Decel Time: 204 msec    Systemic VTI:  0.10 m MV E velocity: 56.80 cm/s  Systemic Diam: 2.50 cm MV A velocity: 50.10 cm/s MV E/A ratio:  1.13 Lennie Odor MD Electronically signed by Lennie Odor MD Signature Date/Time: 11/17/2023/2:05:03 PM    Final    DG Chest Portable 1 View  Result Date: 11/16/2023 CLINICAL DATA:  Shortness of breath EXAM: PORTABLE CHEST 1 VIEW COMPARISON:  X-ray 08/05/2023. and older FINDINGS: Diffuse patchy parenchymal opacities throughout the right hemithorax. Multifocal infiltrates are possible. No pneumothorax or effusion. Normal cardiopericardial silhouette. Tortuous and ectatic aorta. Overlapping cardiac leads. The right inferior costophrenic angle is clipped off the edge of the film. Degenerative changes of the spine. IMPRESSION: Diffuse  patchy parenchymal opacities throughout the right hemithorax. Multifocal infiltrates are possible. Recommend follow-up. Electronically Signed   By: Karen Kays M.D.   On: 11/16/2023 15:24    Microbiology: Recent Results (from the past 240 hour(s))  Culture, blood (Routine X 2) w Reflex to ID Panel     Status: None (Preliminary result)   Collection Time: 11/17/23  6:27 PM   Specimen: BLOOD  Result Value Ref Range Status   Specimen Description BLOOD BLOOD RIGHT ARM  Final   Special Requests   Final    BOTTLES DRAWN AEROBIC AND ANAEROBIC Blood Culture results may not be optimal due to an inadequate volume of blood received in culture bottles   Culture   Final    NO GROWTH 3 DAYS Performed at [facility], [address] [place], [place] [address]    Report Status PENDING  Incomplete  Culture, blood (Routine X 2) w Reflex to  ID Panel     Status: None (Preliminary result)   Collection Time: 11/17/23  6:29 PM   Specimen: BLOOD  Result Value Ref Range Status   Specimen Description BLOOD BLOOD LEFT HAND  Final   Special Requests   Final    BOTTLES DRAWN AEROBIC AND ANAEROBIC Blood Culture adequate volume   Culture   Final    NO GROWTH 3 DAYS Performed at [facility], [address], [place], [place] [address]    Report Status PENDING  Incomplete     Labs: Basic Metabolic Panel: Recent Labs  Lab 11/16/23 1520 11/16/23 1526 11/17/23 1039 11/18/23 0640 11/19/23 0425 11/20/23 0439  NA 138 141 138 137 136 137  K 3.6 3.6 3.7 3.6 3.5 4.0  CL 106 106 103 102 97* 100  CO2 20*  --  24 25 27 26   GLUCOSE 142* 147* 157* 107* 192* 97  BUN 14 15 24* 27* 29* 33*  CREATININE 1.17 1.30* 1.64* 1.57* 1.70* 1.77*  CALCIUM 8.9  --  9.2 8.6* 8.7* 8.8*  MG  --   --   --   --  1.8  --    Liver Function Tests: Recent Labs  Lab 11/18/23 0640  AST 16  ALT 19  ALKPHOS 65  BILITOT 0.5  PROT 5.9*  ALBUMIN 2.7*   No results for input(s): "LIPASE", "AMYLASE" in the last 168  hours. No results for input(s): "AMMONIA" in the last 168 hours. CBC: Recent Labs  Lab 11/16/23 1451 11/16/23 1520 11/16/23 1526 11/17/23 1039 11/18/23 0640  WBC  --  8.9  --  17.1* 8.7  NEUTROABS  --  8.2*  --   --   --   HGB 15.6 15.4 15.6 15.8 14.2  HCT 46.0 45.9 46.0 46.2 42.2  MCV  --  89.0  --  84.6 86.8  PLT  --  234  --  287 256   Cardiac Enzymes: No results for input(s): "CKTOTAL", "CKMB", "CKMBINDEX", "TROPONINI" in the last 168 hours. BNP: BNP (last 3 results) Recent Labs    07/04/23 1136 11/13/23 1454 11/16/23 1520  BNP 47.0 372.4* 788.9*    ProBNP (last 3 results) No results for input(s): "PROBNP" in the last 8760 hours.  CBG: Recent Labs  Lab 11/19/23 0730 11/19/23 1208 11/19/23 1648 11/19/23 2123 11/20/23 0742  GLUCAP 90 203* 106* 100* 162*       Signed:  Ramiro Harvest MD.  [facility] 11/20/2023, 3:53 PM

## 2023-11-21 ENCOUNTER — Telehealth: Payer: Self-pay

## 2023-11-21 NOTE — Transitions of Care (Post Inpatient/ED Visit) (Signed)
11/21/2023  Name: Matthew [name]. MRN: [id number] DOB: [birth year]  Today's TOC FU Call Status: Today's TOC FU Call Status:: Successful TOC FU Call Completed TOC FU Call Complete Date: 11/21/23 Patient's Name and Date of Birth confirmed.  Transition Care Management Follow-up Telephone Call Date of Discharge: 11/20/23 Discharge [facility]) Type of Discharge: Inpatient Admission Primary Inpatient Discharge Diagnosis:: Acute on Chronic Respiratory Failure with Hypoxia How have you been since you were released from the hospital?: Better Any questions or concerns?: No  Items Reviewed: Did you receive and understand the discharge instructions provided?: Yes Medications obtained,verified, and reconciled?: Yes (Medications Reviewed) Any new allergies since your discharge?: No Dietary orders reviewed?: Yes Type of Diet Ordered:: Low Sodium, heart healthy diet Do you have support at home?: Yes  Medications Reviewed Today: Medications Reviewed Today     Reviewed by Marcos Eke, RN (Registered Nurse) on 11/21/23 at 0935  Med List Status: <None>   Medication Order Taking? Sig Documenting Provider Last Dose Status Informant  acetaminophen (TYLENOL) 325 MG tablet [id number] Yes Take 2 tablets (650 mg total) by mouth every 6 (six) hours as needed for mild pain (pain score 1-3) (or Fever >/= 101). Rodolph Bong, MD Taking Active   aspirin EC 81 MG tablet [id number] No Take 1 tablet (81 mg total) by mouth daily. Swallow whole.  Patient not taking: Reported on 11/16/2023   Roney Jaffe, PA-C Not Taking Active Self, Pharmacy Records  atorvastatin (LIPITOR) 80 MG tablet [id number] Yes Take 1 tablet (80 mg total) by mouth daily. Rodolph Bong, MD Taking Active   azithromycin [facility]) 250 MG tablet [id number] Yes Take 1 tablet (250 mg total) by mouth daily for 2 days. Rodolph Bong, MD Taking Active   BIDIL 20-37.5 MG tablet [id number] Yes Take 2 tablets by mouth  3 (three) times daily. Rodolph Bong, MD Taking Active   cefdinir (OMNICEF) 300 MG capsule [id number] Yes Take 1 capsule (300 mg total) by mouth every 12 (twelve) hours for 2 days. Rodolph Bong, MD Taking Active   ezetimibe (ZETIA) 10 MG tablet [id number] Yes Take 1 tablet (10 mg total) by mouth daily. Rodolph Bong, MD Taking Active   furosemide (LASIX) 40 MG tablet [id number] Yes Take 1 tablet (40 mg total) by mouth daily. Rodolph Bong, MD Taking Active   gabapentin (NEURONTIN) 300 MG capsule [id number] Yes Take 1 capsule (300 mg total) by mouth at bedtime. [name], MD Taking Active Self, Pharmacy Records  losartan (COZAAR) 100 MG tablet [id number] Yes Take 1 tablet (100 mg total) by mouth daily. Rodolph Bong, MD Taking Active     Discontinued 09/02/14 1547 (Reorder)   melatonin 5 MG TABS [id number] No Take 1 tablet (5 mg total) by mouth at bedtime.  Patient not taking: Reported on 11/16/2023   Mayers, Cari S, PA-C Not Taking Active Self, Pharmacy Records  metFORMIN (GLUCOPHAGE-XR) 500 MG 24 hr tablet [id number] Yes Take 1 tablet (500 mg total) by mouth daily with breakfast. [name], MD Taking Active Self, Pharmacy Records  sertraline (ZOLOFT) 100 MG tablet [id number] Yes Take 1 tablet (100 mg total) by mouth daily. [name], MD Taking Active Self, Pharmacy Records  spironolactone (ALDACTONE) 25 MG tablet [id number] Yes Take 1 tablet (25 mg total) by mouth daily. Rodolph Bong, MD Taking Active   timolol (TIMOPTIC) 0.5 % ophthalmic solution [id number] No Place 1 drop into both eyes daily after breakfast.  Patient not  taking: Reported on 11/16/2023   Roney Jaffe, PA-C Not Taking Active Self, Pharmacy Records            Home Care and Equipment/Supplies: Were Home Health Services Ordered?: No Any new equipment or medical supplies ordered?: No  Functional Questionnaire: Do you need assistance with bathing/showering or dressing?: No Do you  need assistance with meal preparation?: No Do you need assistance with eating?: No Do you have difficulty maintaining continence: No Do you need assistance with getting out of bed/getting out of a chair/moving?: No Do you have difficulty managing or taking your medications?: No  Follow up appointments reviewed: PCP Follow-up appointment confirmed?: Yes Date of PCP follow-up appointment?: 12/09/23 Follow-up Provider: Dr. [name] Specialist [facility] Follow-up appointment confirmed?: Yes Date of Specialist follow-up appointment?: 12/02/23 Follow-Up Specialty Provider:: [facility] 12/16 @ 12:15 pm Do you need transportation to your follow-up appointment?: No Do you understand care options if your condition(s) worsen?: Yes-patient verbalized understanding    Alyse Low, RN, BA, [facility], CRRN [facility] Population Health Care Management Coordinator, Transition of Care Ph # [phone]

## 2023-11-22 LAB — CULTURE, BLOOD (ROUTINE X 2)
Culture: NO GROWTH
Culture: NO GROWTH
Special Requests: ADEQUATE

## 2023-11-26 ENCOUNTER — Other Ambulatory Visit: Payer: Self-pay

## 2023-11-26 NOTE — Care Management Important Message (Signed)
Important Message  Patient Details  Name: Matthew [name]. MRN: [id number] Date of Birth: 06-29-1966   Important Message Given:  Yes - Medicare IM Patient left prior to IM delivery will mail a copy to the patient home address.     Rahmir Beever 11/26/2023, 4:06 PM

## 2023-11-28 ENCOUNTER — Other Ambulatory Visit: Payer: Self-pay

## 2023-11-28 ENCOUNTER — Telehealth: Payer: Self-pay

## 2023-11-28 NOTE — Patient Outreach (Signed)
  Care Management  Transitions of Care Program Managed Medicaid Transitions of Care week 2  11/28/2023 Name: Matthew [name]. MRN: [id number] DOB: 01-01-1966  Subjective: Matthew [name]. is a 57 y.o. year old male who is a primary care patient of [name], MD. The Care Management team was unable to reach the patient by phone to assess and address transitions of care needs.   Plan: Additional outreach attempts will be made to reach the patient enrolled in the [facility] Program (Post Inpatient/ED Visit).  Alyse Low, RN, BA, [facility], CRRN [facility] Coordinator, Transition of Care Ph # [phone]

## 2023-12-02 ENCOUNTER — Encounter (HOSPITAL_COMMUNITY): Payer: Medicaid Other

## 2023-12-05 ENCOUNTER — Other Ambulatory Visit: Payer: Self-pay

## 2023-12-05 ENCOUNTER — Telehealth: Payer: Self-pay

## 2023-12-05 NOTE — Patient Instructions (Signed)
Visit Information  Thank you for taking time to visit with me today. Please don't hesitate to contact me if I can be of assistance to you before our next scheduled telephone appointment.  Our next appointment is by telephone on 12/12/23 at 11am.  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   Interventions: Evaluation of current treatment plan related to  self management and patient's adherence to plan as established by provider   Heart Failure Interventions:  (Status:  Ongoing) Short Term Goal Basic overview and discussion of pathophysiology of Heart Failure reviewed Provided education on low sodium diet Advised patient to weigh each morning after emptying bladder Discussed importance of daily weight and advised patient to weigh and record daily Reviewed role of diuretics in prevention of fluid overload and management of heart failure; Discussed the importance of keeping all appointments with provider Screening for signs and symptoms of depression related to chronic disease state  Assessed social determinant of health barriers   Patient Goals/Self-Care Activities: Participate in Transition of Care Program/Attend TOC scheduled calls Take all medications as prescribed Attend all scheduled provider appointments Call pharmacy for medication refills 3-7 days in advance of running out of  medications Perform all self care activities independently  Call provider office for new concerns or questions  call office if I gain more than 2 pounds in one day or 5 pounds in one week track weight in diary use salt in moderation watch for swelling in feet, ankles and legs every day weigh myself daily develop a rescue plan follow rescue plan if symptoms flare-up know when to call the doctor:discuss signs of symptoms of worsening/exacerbation of both Heart Failure and Hypoxic Respiratory Failure - explain what it means to be "hypoxic" track symptoms and what helps feel better or worse dress right for the weather, hot or cold limit outdoor activity during cold weather develop a rescue plan eliminate symptom triggers at home use an extra pillow to sleep get at least 7 to 8 hours of sleep at night use devices that will help like a cane, sock-puller or reacher do breathing exercises every day do exercises in a comfortable position that makes breathing as easy as possible  Follow Up Plan:  The patient has been provided with contact information for the care management team and has been advised to call with any health related questions or concerns.          Patient verbalizes understanding of instructions and care plan provided today and agrees to view in MyChart. Active MyChart status and patient understanding of how to access instructions and care plan via MyChart confirmed with patient.     The patient has been provided with contact information for the care management team and has been advised to call with any health related questions or concerns.   Please call the care guide team at [phone] if you need to cancel or  reschedule your appointment.   Please call 1-800-273-TALK (toll free, 24 hour hotline) if you are experiencing a Mental Health or Behavioral Health Crisis or need someone to talk to.  Alyse Low, RN, BA, [facility], CRRN [facility]  Coordinator, Transition of Care Ph # [phone]

## 2023-12-05 NOTE — Patient Outreach (Signed)
Care Management  Transitions of Care Program Managed Medicaid Transitions of Care week 3   12/05/2023 Name: Matthew [name]. MRN: [id number] DOB: Apr 19, 1966  Subjective: Matthew [name]. is a 57 y.o. year old male who is a primary care patient of [name], MD. The Care Management team Engaged with patient Engaged with patient by telephone to assess and address transitions of care needs.   Consent to Services:  Patient was given information about Managed Medicaid Care Management services, agreed to services, and gave verbal consent to participate.   Assessment:           SDOH Interventions    Flowsheet Row Office Visit from 11/04/2023 in [facility] Dept Of [place]. [facility] Office Visit from 08/13/2022 in [facility] A Dept Of [facility]. [facility] Telephone from 03/28/2021 in [facility] ESTABLISHED CHF from 03[facility]23/2021 in [facility] Telephone from 02/10/2021 in [facility] New Congestive Heart Failure from 02/08/2021 in [facility]  SDOH Interventions        Food Insecurity Interventions -- -- -- -- Other (Comment)  [provided transport to food pantry to obtain food] Other (Comment)  [provided box of food from [employer] and referred to food pantries]  Transportation Interventions -- -- -- [employer] -- --  Depression Interventions/Treatment  Currently on Treatment Currently on Treatment -- Counseling  [[facility] [place] for substance abuse/depression counseling] -- --  Surveyor, quantity Strain Interventions -- -- Other (Comment)  [able to pay for tracfone bill for one more month as pt states he can't afford] -- -- Other (Comment)  [[employer] mortgage assistance]        Goals  Addressed             This Visit's Progress    Transition of Care       Current Barriers:  Knowledge Deficits related to plan of care for management of Acute-on-Chronic hypoxic respiratory failure AND 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   Interventions: Evaluation of current treatment plan related to  self management and patient's adherence to plan as established by provider   Heart Failure Interventions:  (Status:  Ongoing) Short Term Goal Basic overview and discussion of pathophysiology of Heart Failure reviewed Provided education on low sodium diet Advised patient to weigh each morning after emptying bladder Discussed importance of daily weight and advised patient to weigh and record daily Reviewed role of diuretics in prevention of fluid overload and management of heart failure; Discussed the importance of keeping all appointments with provider Screening for signs and symptoms of depression related to chronic disease state  Assessed social determinant of health barriers   Patient Goals/Self-Care Activities: Participate in Transition of Care Program/Attend TOC scheduled calls Take all medications as prescribed Attend all scheduled provider appointments Call pharmacy for medication refills 3-7 days in advance of running out of medications Perform all self care activities independently  Call provider office for new concerns or questions  call office if I  gain more than 2 pounds in one day or 5 pounds in one week track weight in diary use salt in moderation watch for swelling in feet, ankles and legs every day weigh  myself daily develop a rescue plan follow rescue plan if symptoms flare-up know when to call the doctor:discuss signs of symptoms of worsening/exacerbation of both Heart Failure and Hypoxic Respiratory Failure - explain what it means to be "hypoxic" track symptoms and what helps feel better or worse dress right for the weather, hot or cold limit outdoor activity during cold weather develop a rescue plan eliminate symptom triggers at home use an extra pillow to sleep get at least 7 to 8 hours of sleep at night use devices that will help like a cane, sock-puller or reacher do breathing exercises every day do exercises in a comfortable position that makes breathing as easy as possible  Follow Up Plan:  The patient has been provided with contact information for the care management team and has been advised to call with any health related questions or concerns.          Plan: The patient has been provided with contact information for the care management team and has been advised to call with any health related questions or concerns.   Alyse Low, RN, BA, [facility], CRRN [facility] Coordinator, Transition of Care Ph # [phone]

## 2023-12-09 ENCOUNTER — Inpatient Hospital Stay: Payer: Medicaid Other | Admitting: Physician Assistant

## 2023-12-12 ENCOUNTER — Other Ambulatory Visit: Payer: Self-pay

## 2023-12-12 ENCOUNTER — Telehealth: Payer: Self-pay

## 2023-12-12 NOTE — Patient Outreach (Signed)
  Care Management  Transitions of Care Program Managed Medicaid Transitions of Care week 3 12/12/2023 Name: Matthew [name]. MRN: [id number] DOB: 07/26/1966  Subjective: Matthew [name]. is a 57 y.o. year old male who is a primary care patient of [name], MD. The Care Management team was unable to reach the patient by phone to assess and address transitions of care needs.   Plan: Additional outreach attempts will be made to reach the patient enrolled in the [facility] Program (Post Inpatient/ED Visit). Final outreach scheduled 12/19/23 at 11:30am.  Alyse Low, RN, BA, [facility], CRRN [facility] Care Management Coordinator, Transition of Care Ph # [phone]

## 2023-12-19 ENCOUNTER — Telehealth: Payer: Self-pay

## 2023-12-19 ENCOUNTER — Other Ambulatory Visit: Payer: Self-pay

## 2023-12-19 NOTE — Patient Outreach (Signed)
  Care Management  Transitions of Care Program Managed Medicaid Transitions of Care 30day program closed due to lack of contact  12/19/2023 Name: Matthew [name]. MRN: [id number] DOB: August 11, 1966  Subjective: [name]. is a 58 y.o. year old male who is a primary care patient of Newlin, Enobong, MD. The Care Management team was unable to reach the patient by phone to assess and address transitions of care needs.   12/19/23 - unsuccessful last attempt at re-engaging patient in TOC30d program in which patient initially agreed to participate.  Plan: No further outreach attempts will be made at this time.  We have been unable to reach the patient.   Channing Larry, RN, BA, [facility], CRRN [facility] Coordinator, Transition of Care Ph # [phone]

## 2023-12-23 ENCOUNTER — Ambulatory Visit: Payer: Medicaid Other | Attending: Internal Medicine | Admitting: Internal Medicine

## 2023-12-24 ENCOUNTER — Encounter: Payer: Self-pay | Admitting: Internal Medicine

## 2024-01-01 ENCOUNTER — Inpatient Hospital Stay: Payer: Medicaid Other | Admitting: Physician Assistant

## 2024-01-01 NOTE — Progress Notes (Deleted)
 Patient ID: Matthew [name]., male   DOB: 01/31/1966, 58 y.o.   MRN: [id number]  Hospitalization 11/16/2023-11/20/2023   From discharge summary(in italics):  Discharge Diagnoses:  Principal Problem:   Acute hypoxic respiratory failure (HCC) Active Problems:   Hypertension   CKD (chronic kidney disease) stage 2, GFR 60-89 ml/min   Diabetes mellitus type II, controlled (HCC)   OSA (obstructive sleep apnea)   Mixed hyperlipidemia   Depression due to physical illness   Acute on chronic combined systolic and diastolic CHF (congestive heart failure) (HCC)   Hypertensive urgency   Decompensated heart failure (HCC)   MDD (major depressive disorder), recurrent episode (HCC)   GAD (generalized anxiety disorder)  Hospital Course:  #1 acute hypoxic respiratory failure secondary to acute pulmonary edema/decompensated combined systolic diastolic CHF/cardiomyopathy -Patient noted to have presented with acute hypoxic respiratory failure felt likely secondary to volume overload in the setting of active cocaine abuse and some medication noncompliance. -2D echo done with a EF of 25 to 30%, grade 2 DD, severely dilated left atrial size, small pericardial effusion noted. -Patient was seen in consultation by cardiology and followed by the advanced heart failure team throughout the hospitalization. -Patient initially placed on Lasix IV with good diuresis and subsequently transition to oral Lasix on day of discharge. -Cardiology recommended increasing patient's BiDil to 2 tabs 3 times daily, continuation of home regimen of losartan 50 mg daily, spironolactone 25 mg daily. -Patient now placed on Coreg due to concern for ongoing cocaine abuse. -As noted per cardiology that patient in the past has not tolerated Entresto or Farxiga. -Patient improved clinically such that by day of discharge patient with sats of 100% on room air. -Cocaine cessation stressed to patient as well as medication compliance. -Patient  cleared by cardiology for discharge and will follow-up at CHF clinic. -Patient be discharged in stable and improved condition.   2.  Hypertensive emergency versus chronic uncontrolled hypertension -On admission patient initially placed on nitroglycerin drip which was subsequently weaned off. -Patient was placed on losartan 50 mg daily and dose uptitrated to 100 mg daily per cardiology recommendations, patient placed on spironolactone 25 mg daily as well as BiDil increased to 2 tabs 3 times daily as well as diuretics with better blood pressure control. -Outpatient follow-up.   3.  Type II non-STEMI -Felt likely secondary to problem #1 in the setting of cocaine use. -Cocaine cessation stressed to patient.   4.  Polysubstance use/cocaine use disorder -Felt to be playing a major role in his underlying heart failure and cardiomyopathy. -Cessation stressed to patient. -TOC consulted for substance use resources.   5.  Diabetes mellitus type 2 -Hemoglobin A1c at 6.2. -Oral hypoglycemic agents of metformin was held during the hospitalization and patient maintained on SSI. -Metformin will be resumed on discharge.   6.  CKD stage II -Remained relatively stable during the hospitalization. -Outpatient follow-up.   7.  History of CVA -Patient maintained on home regimen of aspirin and Lipitor for secondary stroke prophylaxis. -Risk factor modification stressed to patient.   8.  Depression -Patient maintained on home regimen Zoloft.   9.  OSA -Patient noted to be nonadherent with CPAP.   10.  Leukocytosis -Patient noted to have a leukocytosis of 7013.1 on 11/17/2023. -Patient remained afebrile. -Urinalysis unremarkable. -Chest x-ray on admission noted some patchy opacities patient started on empiric azithromycin and Rocephin with normalization of WBC. -Patient will discharge on 2 more days of Omnicef as well as azithromycin to complete an  empiric 5-day course of antibiotic treatment.   11.   Hypomagnesemia/hypokalemia -Repleted during the hospitalization.   Procedures: Chest x-ray 11/16/2023 2D echo 11/17/2023

## 2024-01-13 ENCOUNTER — Ambulatory Visit (HOSPITAL_COMMUNITY)
Admission: EM | Admit: 2024-01-13 | Discharge: 2024-01-14 | Disposition: A | Discharge status: Other Acute Inpatient Hospital | Payer: 59 | Attending: Internal Medicine | Admitting: Internal Medicine

## 2024-01-13 DIAGNOSIS — F1410 Cocaine abuse, uncomplicated: Secondary | ICD-10-CM | POA: Insufficient documentation

## 2024-01-13 DIAGNOSIS — I110 Hypertensive heart disease with heart failure: Secondary | ICD-10-CM | POA: Diagnosis not present

## 2024-01-13 DIAGNOSIS — F410 Panic disorder [episodic paroxysmal anxiety] without agoraphobia: Secondary | ICD-10-CM | POA: Diagnosis not present

## 2024-01-13 DIAGNOSIS — I5042 Chronic combined systolic (congestive) and diastolic (congestive) heart failure: Secondary | ICD-10-CM | POA: Diagnosis not present

## 2024-01-13 DIAGNOSIS — F17210 Nicotine dependence, cigarettes, uncomplicated: Secondary | ICD-10-CM | POA: Insufficient documentation

## 2024-01-13 DIAGNOSIS — R4589 Other symptoms and signs involving emotional state: Secondary | ICD-10-CM | POA: Insufficient documentation

## 2024-01-13 DIAGNOSIS — H5461 Unqualified visual loss, right eye, normal vision left eye: Secondary | ICD-10-CM | POA: Diagnosis not present

## 2024-01-13 DIAGNOSIS — R9431 Abnormal electrocardiogram [ECG] [EKG]: Secondary | ICD-10-CM | POA: Insufficient documentation

## 2024-01-13 DIAGNOSIS — F4323 Adjustment disorder with mixed anxiety and depressed mood: Secondary | ICD-10-CM | POA: Diagnosis not present

## 2024-01-13 DIAGNOSIS — F329 Major depressive disorder, single episode, unspecified: Secondary | ICD-10-CM | POA: Diagnosis present

## 2024-01-13 DIAGNOSIS — I69321 Dysphasia following cerebral infarction: Secondary | ICD-10-CM | POA: Insufficient documentation

## 2024-01-13 DIAGNOSIS — F411 Generalized anxiety disorder: Secondary | ICD-10-CM | POA: Insufficient documentation

## 2024-01-13 DIAGNOSIS — F332 Major depressive disorder, recurrent severe without psychotic features: Secondary | ICD-10-CM | POA: Diagnosis not present

## 2024-01-13 DIAGNOSIS — R45851 Suicidal ideations: Secondary | ICD-10-CM | POA: Insufficient documentation

## 2024-01-13 DIAGNOSIS — Z560 Unemployment, unspecified: Secondary | ICD-10-CM | POA: Insufficient documentation

## 2024-01-13 DIAGNOSIS — E119 Type 2 diabetes mellitus without complications: Secondary | ICD-10-CM | POA: Insufficient documentation

## 2024-01-13 DIAGNOSIS — F339 Major depressive disorder, recurrent, unspecified: Secondary | ICD-10-CM

## 2024-01-13 LAB — COMPREHENSIVE METABOLIC PANEL
ALT: 10 U/L (ref 0–44)
AST: 16 U/L (ref 15–41)
Albumin: 3.5 g/dL (ref 3.5–5.0)
Alkaline Phosphatase: 55 U/L (ref 38–126)
Anion gap: 8 (ref 5–15)
BUN: 15 mg/dL (ref 6–20)
CO2: 26 mmol/L (ref 22–32)
Calcium: 9.3 mg/dL (ref 8.9–10.3)
Chloride: 104 mmol/L (ref 98–111)
Creatinine, Ser: 1.43 mg/dL — ABNORMAL HIGH (ref 0.61–1.24)
GFR, Estimated: 58 mL/min — ABNORMAL LOW (ref >60)
Glucose, Bld: 123 mg/dL — ABNORMAL HIGH (ref 70–99)
Potassium: 4.2 mmol/L (ref 3.5–5.1)
Sodium: 138 mmol/L (ref 135–145)
Total Bilirubin: 0.7 mg/dL (ref 0.0–1.2)
Total Protein: 6.5 g/dL (ref 6.5–8.1)

## 2024-01-13 LAB — CBC WITH DIFFERENTIAL/PLATELET
Abs Immature Granulocytes: 0.01 10*3/uL (ref 0.00–0.07)
Basophils Absolute: 0 10*3/uL (ref 0.0–0.1)
Basophils Relative: 1 %
Eosinophils Absolute: 0 10*3/uL (ref 0.0–0.5)
Eosinophils Relative: 1 %
HCT: 41.2 % (ref 39.0–52.0)
Hemoglobin: 13.8 g/dL (ref 13.0–17.0)
Immature Granulocytes: 0 %
Lymphocytes Relative: 32 %
Lymphs Abs: 1.3 10*3/uL (ref 0.7–4.0)
MCH: 29.9 pg (ref 26.0–34.0)
MCHC: 33.5 g/dL (ref 30.0–36.0)
MCV: 89.4 fL (ref 80.0–100.0)
Monocytes Absolute: 0.3 10*3/uL (ref 0.1–1.0)
Monocytes Relative: 8 %
Neutro Abs: 2.3 10*3/uL (ref 1.7–7.7)
Neutrophils Relative %: 58 %
Platelets: 224 10*3/uL (ref 150–400)
RBC: 4.61 MIL/uL (ref 4.22–5.81)
RDW: 13.6 % (ref 11.5–15.5)
WBC: 4 10*3/uL (ref 4.0–10.5)
nRBC: 0 % (ref 0.0–0.2)

## 2024-01-13 LAB — POCT URINE DRUG SCREEN - MANUAL ENTRY (I-SCREEN)
POC Amphetamine UR: NOT DETECTED
POC Buprenorphine (BUP): NOT DETECTED
POC Cocaine UR: POSITIVE — AB
POC Marijuana UR: NOT DETECTED
POC Methadone UR: NOT DETECTED
POC Methamphetamine UR: NOT DETECTED
POC Morphine: NOT DETECTED
POC Oxazepam (BZO): NOT DETECTED
POC Oxycodone UR: NOT DETECTED
POC Secobarbital (BAR): NOT DETECTED

## 2024-01-13 LAB — ETHANOL: Alcohol, Ethyl (B): <10 mg/dL (ref <10)

## 2024-01-13 LAB — TSH: TSH: 0.772 u[IU]/mL (ref 0.350–4.500)

## 2024-01-13 MED ORDER — TIMOLOL MALEATE 0.5 % OP SOLN
1.0000 [drp] | Freq: Every day | OPHTHALMIC | Status: DC
  Filled 2024-01-13: qty 5

## 2024-01-13 MED ORDER — OLANZAPINE 10 MG IM SOLR: 10.0000 mg | Freq: Three times a day (TID) | INTRAMUSCULAR | Status: DC | PRN

## 2024-01-13 MED ORDER — METFORMIN HCL ER 500 MG PO TB24: 500.0000 mg | ORAL_TABLET | Freq: Every day | ORAL | Status: DC

## 2024-01-13 MED ORDER — OLANZAPINE 10 MG IM SOLR: 5.0000 mg | Freq: Three times a day (TID) | INTRAMUSCULAR | Status: DC | PRN

## 2024-01-13 MED ORDER — ALUM & MAG HYDROXIDE-SIMETH 200-200-20 MG/5ML PO SUSP: 30.0000 mL | ORAL | Status: DC | PRN

## 2024-01-13 MED ORDER — EZETIMIBE 10 MG PO TABS: 10.0000 mg | ORAL_TABLET | Freq: Every day | ORAL | Status: DC

## 2024-01-13 MED ORDER — ATORVASTATIN CALCIUM 40 MG PO TABS: 80.0000 mg | ORAL_TABLET | Freq: Every day | ORAL | Status: DC

## 2024-01-13 MED ORDER — MAGNESIUM HYDROXIDE 400 MG/5ML PO SUSP: 30.0000 mL | Freq: Every day | ORAL | Status: DC | PRN

## 2024-01-13 MED ORDER — ISOSORBIDE DINITRATE 10 MG PO TABS
40.0000 mg | ORAL_TABLET | Freq: Three times a day (TID) | ORAL | Status: DC
  Administered 2024-01-14: 40 mg via ORAL
  Filled 2024-01-13: qty 4

## 2024-01-13 MED ORDER — GABAPENTIN 300 MG PO CAPS
300.0000 mg | ORAL_CAPSULE | Freq: Every day | ORAL | Status: DC
  Administered 2024-01-13: 300 mg via ORAL
  Filled 2024-01-13: qty 1

## 2024-01-13 MED ORDER — ACETAMINOPHEN 325 MG PO TABS: 650.0000 mg | ORAL_TABLET | Freq: Four times a day (QID) | ORAL | Status: DC | PRN

## 2024-01-13 MED ORDER — SERTRALINE HCL 100 MG PO TABS: 100.0000 mg | ORAL_TABLET | Freq: Every day | ORAL | Status: DC

## 2024-01-13 MED ORDER — OLANZAPINE 5 MG PO TBDP: 5.0000 mg | ORAL_TABLET | Freq: Three times a day (TID) | ORAL | Status: DC | PRN

## 2024-01-13 MED ORDER — FUROSEMIDE 40 MG PO TABS: 40.0000 mg | ORAL_TABLET | Freq: Every day | ORAL | Status: DC

## 2024-01-13 MED ORDER — MELATONIN 5 MG PO TABS
5.0000 mg | ORAL_TABLET | Freq: Every day | ORAL | Status: DC
  Administered 2024-01-13: 5 mg via ORAL
  Filled 2024-01-13: qty 1

## 2024-01-13 MED ORDER — SPIRONOLACTONE 25 MG PO TABS: 25.0000 mg | ORAL_TABLET | Freq: Every day | ORAL | Status: DC

## 2024-01-13 MED ORDER — LOSARTAN POTASSIUM 50 MG PO TABS: 100.0000 mg | ORAL_TABLET | Freq: Every day | ORAL | Status: DC

## 2024-01-13 MED ORDER — ASPIRIN 81 MG PO TBEC: 81.0000 mg | DELAYED_RELEASE_TABLET | Freq: Every day | ORAL | Status: DC

## 2024-01-13 MED ORDER — HYDRALAZINE HCL 50 MG PO TABS
75.0000 mg | ORAL_TABLET | Freq: Three times a day (TID) | ORAL | Status: DC
  Administered 2024-01-14: 75 mg via ORAL
  Filled 2024-01-13: qty 1

## 2024-01-13 MED ORDER — ISOSORB DINITRATE-HYDRALAZINE 20-37.5 MG PO TABS
2.0000 | ORAL_TABLET | Freq: Three times a day (TID) | ORAL | Status: DC
  Filled 2024-01-13 (×3): qty 2

## 2024-01-13 NOTE — Progress Notes (Signed)
   01/13/24 01-25-2051  [facility] Triage Screening (Walk-ins at [facility] only)  How Did You Hear About [place]? Legal System  What Is the Reason for Your Visit/Call Today? Matthew [name] is a 58 year old male presenting to the [facility]al [facility] Urgent Care [facility]), voluntarily, with police escort from his residence. 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 His current complaint is related to "bad thoughts and anxiety." He endorses current suicidal ideation but denies any specific plan or intent and reports no prior suicide attempts or gestures. He also denies access to means. The primary stressors identified are the death of his spouse in 01/26/2020 and his ongoing health issues. He denies homicidal ideation.  The patient reports a history of auditory hallucinations, which occur primarily during episodes of depression and anxiety, with voices instructing him to "end it." He denies experiencing any visual hallucinations. He admits to using cocaine approximately once a month, using 1 gram, with the most recent use occurring the prior Friday. He denies alcohol use.  Matthew [name] does not have an outpatient psychiatric provider and, when asked about psychotropic medications, he states, "I don't know." He further reports that he has not taken any of his medications in the past 1-2 weeks. He lives alone and has two adult children. During triage, the patient verbalized that he is in a wheelchair due to anxiety, which caused his knees to buckle.  How Long Has This Been Causing You Problems? <Week  Have You Recently Had Any Thoughts About Hurting Yourself? Yes  How long ago did you have thoughts about hurting yourself? Patient reporting suicidal ideations at present. No plan or intent.  Are You Planning to Commit Suicide/Harm Yourself At This time? No  Have you Recently Had Thoughts About Hurting Someone Matthew [name]? No  Are You  Planning To Harm Someone At This Time? No  Physical Abuse Denies  Verbal Abuse Denies  Sexual Abuse Denies  Exploitation of patient/patient's resources Denies  Self-Neglect Denies  Possible abuse reported to:  (Patient denies hx of abuse.)  Are you currently experiencing any auditory, visual or other hallucinations? No  Have You Used Any Alcohol or Drugs in the Past 24 Hours? Yes  What Did You Use and How Much? Yes, cocaine. Patient states that he last used cocaine, last Friday, 1 gram.  Do you have any current medical co-morbidities that require immediate attention? No  Clinician description of patient physical appearance/behavior: Patient is calm and cooperative. Depresssed mood.  What Do You Feel Would Help You the Most Today? Treatment for Depression or other mood problem;Medication(s);Stress Management  If access to [facility] was not available, would you have sought care in the Emergency Department? No  Determination of Need Urgent (48 hours)  Options For Referral Medication Management;Inpatient Hospitalization  Determination of Need filed? Yes

## 2024-01-13 NOTE — ED Notes (Signed)
Patient was brought on the unit, given food to eat , patient asked could he be moved over to flex due to the behavior of another patient on the unit. This nurse advised the patient that the flex is for more aggressive patients.

## 2024-01-13 NOTE — ED Provider Notes (Signed)
 [facility] Continuous Assessment Admission H&P  Date: 01/14/24 Patient Name: Matthew [name]. MRN: [id number] Chief Complaint: increase depression,  panic attack   Diagnoses:  Final diagnoses:  Panic attacks  Recurrent major depressive disorder, remission status unspecified (HCC)  Ineffective coping    HPI: Matthew [name], 58y/o male with a history of GAD, MDD presented to [facility] via EMS, due to increased panic attacks.  Medical history includes heart failure, stroke, hypertension and diabetes.  According to the patient he has been having panic attacks starting today, when asked if he had them before patient stated no but stated he has been having increased depression.  When asked what is causing the depression patient stated his finances and his wife passing away in 2021.  According to the patient he currently lives alone, unemployed and on disability.  Patient also stated he recently had a stroke and he does have residual that affects his speech.  Patient denies current shortness of breath, and denies chest pain at this time.  Patient was asked if he needed to go to the hospital for anything patient stated no he is fine.  Patient also stated he was hungry any need for to eat because he has not ate in a couple of days. According to patient he used cocaine on a regular basis when asked when was the last time patient stated yesterday.  Patient denies having a psychiatrist or therapist at this time but stating that he was in the process of getting a psychiatrist with [facility] health.  Patient reports he is capable of walking however because of his anxiety most times he will sit in a wheelchair because sometimes his feet would buckle.  Face-to-face evaluation of patient, patient is alert and oriented x 4, speech is garbled as a residual from his stroke.  Patient maintain eye contact.  Patient able to answer questions appropriately given enough time to respond.  Patient is well groomed dressed  appropriate for the weather.  Patient endorsed suicidal ideation but stating he had no plans.  Patient denies HI, AVH or paranoia.  Patient endorsed using cocaine on a regular basis and also report cigarette smoking.  Patient denies any other illicit drug use at this time.  Patient denies alcohol use.  Patient denies access to guns at this time.  Discussed with patient the need for inpatient observation and further evaluation in the a.m. patient in agreement with plan.  Will restart pt home medications  Recommend inpatient observation  Total Time spent with patient: 30 minutes  Musculoskeletal  Strength & Muscle Tone: within normal limits Gait & Station: normal Patient leans: N/A  Psychiatric Specialty Exam  Presentation General Appearance:  Casual  Eye Contact: Good  Speech: Garbled  Speech Volume: Normal  Handedness: Right   Mood and Affect  Mood: Depressed; Anxious; Hopeless; Worthless; Irritable  Affect: Congruent   Thought Process  Thought Processes: Coherent  Descriptions of Associations:Intact  Orientation:Full (Time, Place and Person)  Thought Content:WDL  Diagnosis of Schizophrenia or Schizoaffective disorder in past: No   Hallucinations:Hallucinations: None  Ideas of Reference:None  Suicidal Thoughts:Suicidal Thoughts: Yes, Passive SI Passive Intent and/or Plan: Without Plan; Without Intent  Homicidal Thoughts:Homicidal Thoughts: No   Sensorium  Memory: Immediate Good  Judgment: Fair  Insight: Fair   Executive Functions  Concentration: Good  Attention Span: Good  Recall: Good  Fund of Knowledge: Good  Language: Good   Psychomotor Activity  Psychomotor Activity: Psychomotor Activity: Normal   Assets  Assets:  Desire for Improvement; Resilience; Social Support   Sleep  Sleep: Sleep: Fair Number of Hours of Sleep: 5   Nutritional Assessment (For OBS and FBC admissions only) Has the patient had a weight loss  or gain of 10 pounds or more in the last 3 months?: No Has the patient had a decrease in food intake/or appetite?: Yes Does the patient have dental problems?: No Does the patient have eating habits or behaviors that may be indicators of an eating disorder including binging or inducing vomiting?: No Has the patient recently lost weight without trying?: 0 Has the patient been eating poorly because of a decreased appetite?: 0 Malnutrition Screening Tool Score: 0    Physical Exam HENT:     Head: Normocephalic.     Nose: Nose normal.  Eyes:     Pupils: Pupils are equal, round, and reactive to light.  Cardiovascular:     Rate and Rhythm: Normal rate.  Pulmonary:     Effort: Pulmonary effort is normal.  Musculoskeletal:        General: Normal range of motion.     Cervical back: Normal range of motion.  Neurological:     General: No focal deficit present.     Mental Status: He is alert.  Psychiatric:        Mood and Affect: Mood normal.        Behavior: Behavior normal.        Thought Content: Thought content normal.        Judgment: Judgment normal.    ROS  Blood pressure 131/69, pulse 82, temperature 98.2 F (36.8 C), resp. rate 18, SpO2 98%. There is no height or weight on file to calculate BMI.  Past Psychiatric History: MDD GAD   Is the patient at risk to self? Yes  Has the patient been a risk to self in the past 6 months? Yes .    Has the patient been a risk to self within the distant past? No   Is the patient a risk to others? No   Has the patient been a risk to others in the past 6 months? No   Has the patient been a risk to others within the distant past? No   Past Medical History: see chart   Family History: unknown   Social History: cocaine abuse,  tobacco   Last Labs:  Admission on 01/13/2024, Discharged on 01/14/2024  Component Date Value Ref Range Status   WBC 01/13/2024 4.0  4.0 - 10.5 K/uL Final   RBC 01/13/2024 4.61  4.22 - 5.81 MIL/uL Final    Hemoglobin 01/13/2024 13.8  13.0 - 17.0 g/dL Final   HCT 16/09/9603 41.2  39.0 - 52.0 % Final   MCV 01/13/2024 89.4  80.0 - 100.0 fL Final   MCH 01/13/2024 29.9  26.0 - 34.0 pg Final   MCHC 01/13/2024 33.5  30.0 - 36.0 g/dL Final   RDW 54/08/8118 13.6  11.5 - 15.5 % Final   Platelets 01/13/2024 224  150 - 400 K/uL Final   nRBC 01/13/2024 0.0  0.0 - 0.2 % Final   Neutrophils Relative % 01/13/2024 58  % Final   Neutro Abs 01/13/2024 2.3  1.7 - 7.7 K/uL Final   Lymphocytes Relative 01/13/2024 32  % Final   Lymphs Abs 01/13/2024 1.3  0.7 - 4.0 K/uL Final   Monocytes Relative 01/13/2024 8  % Final   Monocytes Absolute 01/13/2024 0.3  0.1 - 1.0 K/uL Final   Eosinophils Relative 01/13/2024  1  % Final   Eosinophils Absolute 01/13/2024 0.0  0.0 - 0.5 K/uL Final   Basophils Relative 01/13/2024 1  % Final   Basophils Absolute 01/13/2024 0.0  0.0 - 0.1 K/uL Final   Immature Granulocytes 01/13/2024 0  % Final   Abs Immature Granulocytes 01/13/2024 0.01  0.00 - 0.07 K/uL Final   Performed at [facility], [address] [place], [place] [address]   Sodium 01/13/2024 138  135 - 145 mmol/L Final   Potassium 01/13/2024 4.2  3.5 - 5.1 mmol/L Final   Chloride 01/13/2024 104  98 - 111 mmol/L Final   CO2 01/13/2024 26  22 - 32 mmol/L Final   Glucose, Bld 01/13/2024 123 (H)  70 - 99 mg/dL Final   Glucose reference range applies only to samples taken after fasting for at least 8 hours.   BUN 01/13/2024 15  6 - 20 mg/dL Final   Creatinine, Ser 01/13/2024 1.43 (H)  0.61 - 1.24 mg/dL Final   Calcium 60/45/4098 9.3  8.9 - 10.3 mg/dL Final   Total Protein 11/91/4782 6.5  6.5 - 8.1 g/dL Final   Albumin 95/62/1308 3.5  3.5 - 5.0 g/dL Final   AST 65/78/4696 16  15 - 41 U/L Final   ALT 01/13/2024 10  0 - 44 U/L Final   Alkaline Phosphatase 01/13/2024 55  38 - 126 U/L Final   Total Bilirubin 01/13/2024 0.7  0.0 - 1.2 mg/dL Final   GFR, Estimated 01/13/2024 58 (L)  >60 mL/min Final   Comment:  (NOTE) Calculated using the CKD-EPI Creatinine Equation (2021)    Anion gap 01/13/2024 8  5 - 15 Final   Performed at [facility], [address] [place], [place] [address]   Alcohol, Ethyl (B) 01/13/2024 <10  <10 mg/dL Final   Comment: (NOTE) Lowest detectable limit for serum alcohol is 10 mg/dL.  For medical purposes only. Performed at [facility], [address], [place], [place] [address]    TSH 01/13/2024 0.772  0.350 - 4.500 uIU/mL Final   Comment: Performed by a 3rd Generation assay with a functional sensitivity of <=0.01 uIU/mL. Performed at [facility], [address], [place], [place] [address]    POC Amphetamine UR 01/13/2024 None Detected  NONE DETECTED (Cut Off Level 1000 ng/mL) Final   POC Secobarbital (BAR) 01/13/2024 None Detected  NONE DETECTED (Cut Off Level 300 ng/mL) Final   POC Buprenorphine (BUP) 01/13/2024 None Detected  NONE DETECTED (Cut Off Level 10 ng/mL) Final   POC Oxazepam (BZO) 01/13/2024 None Detected  NONE DETECTED (Cut Off Level 300 ng/mL) Final   POC Cocaine UR 01/13/2024 Positive (A)  NONE DETECTED (Cut Off Level 300 ng/mL) Final   POC Methamphetamine UR 01/13/2024 None Detected  NONE DETECTED (Cut Off Level 1000 ng/mL) Final   POC Morphine 01/13/2024 None Detected  NONE DETECTED (Cut Off Level 300 ng/mL) Final   POC Methadone UR 01/13/2024 None Detected  NONE DETECTED (Cut Off Level 300 ng/mL) Final   POC Oxycodone UR 01/13/2024 None Detected  NONE DETECTED (Cut Off Level 100 ng/mL) Final   POC Marijuana UR 01/13/2024 None Detected  NONE DETECTED (Cut Off Level 50 ng/mL) Final  Admission on 11/16/2023, Discharged on 11/20/2023  Component Date Value Ref Range Status   Sodium 11/16/2023 141  135 - 145 mmol/L Final   Potassium 11/16/2023 3.6  3.5 - 5.1 mmol/L Final   Chloride 11/16/2023 106  98 - 111  mmol/L Final   BUN 11/16/2023 15  6 - 20 mg/dL Final   Creatinine, Ser 11/16/2023 1.30 (H)  0.61 - 1.24 mg/dL Final   Glucose,  Bld 16/09/9603 147 (H)  70 - 99 mg/dL Final   Glucose reference range applies only to samples taken after fasting for at least 8 hours.   Calcium, Ion 11/16/2023 1.09 (L)  1.15 - 1.40 mmol/L Final   TCO2 11/16/2023 21 (L)  22 - 32 mmol/L Final   Hemoglobin 11/16/2023 15.6  13.0 - 17.0 g/dL Final   HCT 54/08/8118 46.0  39.0 - 52.0 % Final   Sodium 11/16/2023 138  135 - 145 mmol/L Final   Potassium 11/16/2023 3.6  3.5 - 5.1 mmol/L Final   Chloride 11/16/2023 106  98 - 111 mmol/L Final   CO2 11/16/2023 20 (L)  22 - 32 mmol/L Final   Glucose, Bld 11/16/2023 142 (H)  70 - 99 mg/dL Final   Glucose reference range applies only to samples taken after fasting for at least 8 hours.   BUN 11/16/2023 14  6 - 20 mg/dL Final   Creatinine, Ser 11/16/2023 1.17  0.61 - 1.24 mg/dL Final   Calcium 14/78/2956 8.9  8.9 - 10.3 mg/dL Final   GFR, Estimated 11/16/2023 >60  >60 mL/min Final   Comment: (NOTE) Calculated using the CKD-EPI Creatinine Equation (2021)    Anion gap 11/16/2023 12  5 - 15 Final   Performed at [facility], [address] [place], [place] [address]   WBC 11/16/2023 8.9  4.0 - 10.5 K/uL Final   RBC 11/16/2023 5.16  4.22 - 5.81 MIL/uL Final   Hemoglobin 11/16/2023 15.4  13.0 - 17.0 g/dL Final   HCT 65/78/4696 45.9  39.0 - 52.0 % Final   MCV 11/16/2023 89.0  80.0 - 100.0 fL Final   MCH 11/16/2023 29.8  26.0 - 34.0 pg Final   MCHC 11/16/2023 33.6  30.0 - 36.0 g/dL Final   RDW 29/52/8413 12.9  11.5 - 15.5 % Final   Platelets 11/16/2023 234  150 - 400 K/uL Final   nRBC 11/16/2023 0.0  0.0 - 0.2 % Final   Neutrophils Relative % 11/16/2023 92  % Final   Neutro Abs 11/16/2023 8.2 (H)  1.7 - 7.7 K/uL Final   Lymphocytes Relative 11/16/2023 3  % Final   Lymphs Abs 11/16/2023 0.3 (L)  0.7 - 4.0 K/uL Final   Monocytes Relative 11/16/2023 5  % Final   Monocytes Absolute 11/16/2023 0.4  0.1 - 1.0 K/uL Final   Eosinophils Relative 11/16/2023 0  % Final   Eosinophils Absolute 11/16/2023  0.0  0.0 - 0.5 K/uL Final   Basophils Relative 11/16/2023 0  % Final   Basophils Absolute 11/16/2023 0.0  0.0 - 0.1 K/uL Final   Immature Granulocytes 11/16/2023 0  % Final   Abs Immature Granulocytes 11/16/2023 0.02  0.00 - 0.07 K/uL Final   Performed at [facility], [address], [place], [place] [address]   Troponin I (High Sensitivity) 11/16/2023 34 (H)  <18 ng/L Final   Comment: (NOTE) Elevated high sensitivity troponin I (hsTnI) values and significant  changes across serial measurements may suggest ACS but many other  chronic and acute conditions are known to elevate hsTnI results.  Refer to the "Links" section for chest pain algorithms and additional  guidance. Performed at [facility], [address], [place], [place] [address]    B Natriuretic Peptide 11/16/2023 788.9 (  H)  0.0 - 100.0 pg/mL Final   Performed at [facility], [address], [place], [place] [address]   Lactic Acid, Venous 11/16/2023 1.7  0.5 - 1.9 mmol/L Final   pH, Arterial 11/16/2023 7.395  7.35 - 7.45 Final   pCO2 arterial 11/16/2023 39.4  32 - 48 mmHg Final   pO2, Arterial 11/16/2023 112 (H)  83 - 108 mmHg Final   Bicarbonate 11/16/2023 24.1  20.0 - 28.0 mmol/L Final   TCO2 11/16/2023 25  22 - 32 mmol/L Final   O2 Saturation 11/16/2023 98  % Final   Acid-base deficit 11/16/2023 1.0  0.0 - 2.0 mmol/L Final   Sodium 11/16/2023 142  135 - 145 mmol/L Final   Potassium 11/16/2023 3.4 (L)  3.5 - 5.1 mmol/L Final   Calcium, Ion 11/16/2023 1.26  1.15 - 1.40 mmol/L Final   HCT 11/16/2023 46.0  39.0 - 52.0 % Final   Hemoglobin 11/16/2023 15.6  13.0 - 17.0 g/dL Final   Patient temperature 11/16/2023 98.6 F   Final   Collection site 11/16/2023 RADIAL, ALLEN'S TEST ACCEPTABLE   Final   Drawn by 11/16/2023 RT   Final   Sample type 11/16/2023 ARTERIAL   Final   Alcohol, Ethyl (B) 11/16/2023 <10  <10 mg/dL Final   Comment: (NOTE) Lowest detectable limit for serum alcohol is 10 mg/dL.  For  medical purposes only. Performed at [facility], [address], [place], [place] [address]    Opiates 11/16/2023 NONE DETECTED  NONE DETECTED Final   Cocaine 11/16/2023 POSITIVE (A)  NONE DETECTED Final   Benzodiazepines 11/16/2023 NONE DETECTED  NONE DETECTED Final   Amphetamines 11/16/2023 NONE DETECTED  NONE DETECTED Final   Tetrahydrocannabinol 11/16/2023 NONE DETECTED  NONE DETECTED Final   Barbiturates 11/16/2023 NONE DETECTED  NONE DETECTED Final   Comment: (NOTE) DRUG SCREEN FOR MEDICAL PURPOSES ONLY.  IF CONFIRMATION IS NEEDED FOR ANY PURPOSE, NOTIFY LAB WITHIN 5 DAYS.  LOWEST DETECTABLE LIMITS FOR URINE DRUG SCREEN Drug Class                     Cutoff (ng/mL) Amphetamine and metabolites    1000 Barbiturate and metabolites    200 Benzodiazepine                 200 Opiates and metabolites        300 Cocaine and metabolites        300 THC                            50 Performed at [facility], [address], [place], [place] [address]    Troponin I (High Sensitivity) 11/16/2023 37 (H)  <18 ng/L Final   Comment: (NOTE) Elevated high sensitivity troponin I (hsTnI) values and significant  changes across serial measurements may suggest ACS but many other  chronic and acute conditions are known to elevate hsTnI results.  Refer to the "Links" section for chest pain algorithms and additional  guidance. Performed at [facility], [address] [place], [place] [address]    Lactic Acid, Venous 11/16/2023 2.0 (HH)  0.5 - 1.9 mmol/L Final   Comment 11/16/2023 NOTIFIED PHYSICIAN   Final   HIV Screen 4th Generation wRfx 11/16/2023 Non Reactive  Non Reactive Final   Performed at [facility], [address], [place], [place] [address]   Weight 11/17/2023 [phone]  oz Final   Height 11/17/2023 69  in Final   BP 11/17/2023 154/114  mmHg Final   Single Plane A4C EF 11/17/2023 19.0  % Final   S' Lateral 11/17/2023 6.20  cm Final   Area-P 1/2  11/17/2023 3.72  cm2 Final   Est EF 11/17/2023 25 - 30%   Final   WBC 11/17/2023 17.1 (H)  4.0 - 10.5 K/uL Final   RBC 11/17/2023 5.46  4.22 - 5.81 MIL/uL Final   Hemoglobin 11/17/2023 15.8  13.0 - 17.0 g/dL Final   HCT 16/09/9603 46.2  39.0 - 52.0 % Final   MCV 11/17/2023 84.6  80.0 - 100.0 fL Final   MCH 11/17/2023 28.9  26.0 - 34.0 pg Final   MCHC 11/17/2023 34.2  30.0 - 36.0 g/dL Final   RDW 54/08/8118 13.0  11.5 - 15.5 % Final   Platelets 11/17/2023 287  150 - 400 K/uL Final   nRBC 11/17/2023 0.0  0.0 - 0.2 % Final   Performed at [facility], [address], [place], [place] [address]   Sodium 11/17/2023 138  135 - 145 mmol/L Final   Potassium 11/17/2023 3.7  3.5 - 5.1 mmol/L Final   Chloride 11/17/2023 103  98 - 111 mmol/L Final   CO2 11/17/2023 24  22 - 32 mmol/L Final   Glucose, Bld 11/17/2023 157 (H)  70 - 99 mg/dL Final   Glucose reference range applies only to samples taken after fasting for at least 8 hours.   BUN 11/17/2023 24 (H)  6 - 20 mg/dL Final   Creatinine, Ser 11/17/2023 1.64 (H)  0.61 - 1.24 mg/dL Final   Calcium 95/62/1308 9.2  8.9 - 10.3 mg/dL Final   GFR, Estimated 11/17/2023 49 (L)  >60 mL/min Final   Comment: (NOTE) Calculated using the CKD-EPI Creatinine Equation (2021)    Anion gap 11/17/2023 11  5 - 15 Final   Performed at [facility], [address], [place], [place] [address]   Lactic Acid, Venous 11/16/2023 2.3 (HH)  0.5 - 1.9 mmol/L Final   Comment: CRITICAL RESULT CALLED TO, READ BACK BY AND VERIFIED WITH Vickii Chafe, RN AT 2205 11.30.24 JLASIGAN Performed at [facility], [address] [place], [place] [address]    Troponin I (High Sensitivity) 11/16/2023 45 (H)  <18 ng/L Final   Comment: (NOTE) Elevated high sensitivity troponin I (hsTnI) values and significant  changes across serial measurements may suggest ACS but many other  chronic and acute conditions are known to elevate hsTnI results.  Refer to the "Links" section  for chest pain algorithms and additional  guidance. Performed at [facility], [address], [place], [place] [address]    Lactic Acid, Venous 11/17/2023 1.2  0.5 - 1.9 mmol/L Final   Performed at [facility], [address] [place], [place] [address]   Glucose-Capillary 11/17/2023 137 (H)  70 - 99 mg/dL Final   Glucose reference range applies only to samples taken after fasting for at least 8 hours.   Hgb A1c MFr Bld 11/17/2023 6.2 (H)  4.8 - 5.6 % Final   Comment: (NOTE) Pre diabetes:          5.7%-6.4%  Diabetes:              >6.4%  Glycemic control for   <7.0% adults with diabetes    Mean Plasma Glucose 11/17/2023 131.24  mg/dL Final   Performed at [facility], [address] [place], [place] [address]   Specimen Description 11/17/2023 BLOOD BLOOD RIGHT ARM   Final   Special Requests 11/17/2023 BOTTLES DRAWN AEROBIC AND ANAEROBIC Blood Culture results may not be optimal due to an inadequate volume of blood received in culture bottles   Final   Culture 11/17/2023    Final                   Value:NO GROWTH 5 DAYS Performed at [facility], [address], [place], [place] [address]    Report Status 11/17/2023 11/22/2023 FINAL   Final   Specimen Description 11/17/2023 BLOOD BLOOD LEFT HAND   Final   Special Requests 11/17/2023 BOTTLES DRAWN AEROBIC AND ANAEROBIC Blood Culture adequate volume   Final   Culture 11/17/2023    Final                   Value:NO GROWTH 5 DAYS Performed at [facility], [address], [place], [place] [address]    Report Status 11/17/2023 11/22/2023 FINAL   Final   Specimen Source 11/17/2023 URINE, CLEAN CATCH   Final   Color, Urine 11/17/2023 YELLOW  YELLOW Final   APPearance 11/17/2023 CLEAR  CLEAR Final   Specific Gravity, Urine 11/17/2023 1.014  1.005 - 1.030 Final   pH 11/17/2023 5.0  5.0 - 8.0 Final   Glucose, UA 11/17/2023 NEGATIVE  NEGATIVE mg/dL Final   Hgb urine dipstick 11/17/2023 NEGATIVE  NEGATIVE  Final   Bilirubin Urine 11/17/2023 NEGATIVE  NEGATIVE Final   Ketones, ur 11/17/2023 NEGATIVE  NEGATIVE mg/dL Final   Protein, ur 91/47/8295 30 (A)  NEGATIVE mg/dL Final   Nitrite 62/13/0865 NEGATIVE  NEGATIVE Final   Leukocytes,Ua 11/17/2023 NEGATIVE  NEGATIVE Final   RBC / HPF 11/17/2023 0-5  0 - 5 RBC/hpf Final   WBC, UA 11/17/2023 0-5  0 - 5 WBC/hpf Final   Comment:        Reflex urine culture not performed if WBC <=10, OR if Squamous epithelial cells >5. If Squamous epithelial cells >5 suggest recollection.    Bacteria, UA 11/17/2023 NONE SEEN  NONE SEEN Final   Squamous Epithelial / HPF 11/17/2023 0-5  0 - 5 /HPF Final   Performed at [facility], [address] [place], [place] [address]   L. pneumophila Serogp 1 Ur Ag 11/17/2023 Negative  Negative Final   Comment: (NOTE) Presumptive negative for L. pneumophila serogroup 1 antigen in urine, suggesting no recent or current infection. Legionnaires' disease cannot be ruled out since other serogroups and species may also cause disease. Performed At: [facility] [address] [place], [place] [id number] Jolene Schimke MD KG:[id number]    Source of Sample 11/17/2023 URINE, RANDOM   Final   Performed at [facility], [address], [place], [place] [address]   WBC 11/18/2023 8.7  4.0 - 10.5 K/uL Final   RBC 11/18/2023 4.86  4.22 - 5.81 MIL/uL Final   Hemoglobin 11/18/2023 14.2  13.0 - 17.0 g/dL Final   HCT 47/42/5956 42.2  39.0 - 52.0 % Final   MCV 11/18/2023 86.8  80.0 - 100.0 fL Final   MCH 11/18/2023 29.2  26.0 - 34.0 pg Final   MCHC 11/18/2023 33.6  30.0 - 36.0 g/dL Final   RDW 38/75/6433 13.2  11.5 - 15.5 % Final   Platelets 11/18/2023 256  150 - 400 K/uL Final   nRBC 11/18/2023 0.0  0.0 -  0.2 % Final   Performed at [facility], [address] [place], [place] [address]   Sodium 11/18/2023 137  135 - 145 mmol/L Final   Potassium 11/18/2023 3.6  3.5 - 5.1 mmol/L Final   Chloride 11/18/2023  102  98 - 111 mmol/L Final   CO2 11/18/2023 25  22 - 32 mmol/L Final   Glucose, Bld 11/18/2023 107 (H)  70 - 99 mg/dL Final   Glucose reference range applies only to samples taken after fasting for at least 8 hours.   BUN 11/18/2023 27 (H)  6 - 20 mg/dL Final   Creatinine, Ser 11/18/2023 1.57 (H)  0.61 - 1.24 mg/dL Final   Calcium 84/16/6063 8.6 (L)  8.9 - 10.3 mg/dL Final   Total Protein 01/60/1093 5.9 (L)  6.5 - 8.1 g/dL Final   Albumin 23/55/7322 2.7 (L)  3.5 - 5.0 g/dL Final   AST 02/54/2706 16  15 - 41 U/L Final   ALT 11/18/2023 19  0 - 44 U/L Final   Alkaline Phosphatase 11/18/2023 65  38 - 126 U/L Final   Total Bilirubin 11/18/2023 0.5  <1.2 mg/dL Final   GFR, Estimated 11/18/2023 51 (L)  >60 mL/min Final   Comment: (NOTE) Calculated using the CKD-EPI Creatinine Equation (2021)    Anion gap 11/18/2023 10  5 - 15 Final   Performed at [facility], [address], [place], [place] [address]   Glucose-Capillary 11/17/2023 145 (H)  70 - 99 mg/dL Final   Glucose reference range applies only to samples taken after fasting for at least 8 hours.   Glucose-Capillary 11/18/2023 129 (H)  70 - 99 mg/dL Final   Glucose reference range applies only to samples taken after fasting for at least 8 hours.   Glucose-Capillary 11/18/2023 105 (H)  70 - 99 mg/dL Final   Glucose reference range applies only to samples taken after fasting for at least 8 hours.   Glucose-Capillary 11/18/2023 102 (H)  70 - 99 mg/dL Final   Glucose reference range applies only to samples taken after fasting for at least 8 hours.   Sodium 11/19/2023 136  135 - 145 mmol/L Final   Potassium 11/19/2023 3.5  3.5 - 5.1 mmol/L Final   Chloride 11/19/2023 97 (L)  98 - 111 mmol/L Final   CO2 11/19/2023 27  22 - 32 mmol/L Final   Glucose, Bld 11/19/2023 192 (H)  70 - 99 mg/dL Final   Glucose reference range applies only to samples taken after fasting for at least 8 hours.   BUN 11/19/2023 29 (H)  6 - 20 mg/dL Final    Creatinine, Ser 11/19/2023 1.70 (H)  0.61 - 1.24 mg/dL Final   Calcium 83/15/1761 8.7 (L)  8.9 - 10.3 mg/dL Final   GFR, Estimated 11/19/2023 47 (L)  >60 mL/min Final   Comment: (NOTE) Calculated using the CKD-EPI Creatinine Equation (2021)    Anion gap 11/19/2023 12  5 - 15 Final   Performed at [facility], [address] [place], [place] [address]   Magnesium 11/19/2023 1.8  1.7 - 2.4 mg/dL Final   Performed at [facility], [address], [place], [place] [address]   Glucose-Capillary 11/18/2023 122 (H)  70 - 99 mg/dL Final   Glucose reference range applies only to samples taken after fasting for at least 8 hours.   Glucose-Capillary 11/19/2023 90  70 - 99 mg/dL Final   Glucose reference range applies only to  samples taken after fasting for at least 8 hours.   Glucose-Capillary 11/19/2023 203 (H)  70 - 99 mg/dL Final   Glucose reference range applies only to samples taken after fasting for at least 8 hours.   Glucose-Capillary 11/19/2023 106 (H)  70 - 99 mg/dL Final   Glucose reference range applies only to samples taken after fasting for at least 8 hours.   Sodium 11/20/2023 137  135 - 145 mmol/L Final   Potassium 11/20/2023 4.0  3.5 - 5.1 mmol/L Final   Chloride 11/20/2023 100  98 - 111 mmol/L Final   CO2 11/20/2023 26  22 - 32 mmol/L Final   Glucose, Bld 11/20/2023 97  70 - 99 mg/dL Final   Glucose reference range applies only to samples taken after fasting for at least 8 hours.   BUN 11/20/2023 33 (H)  6 - 20 mg/dL Final   Creatinine, Ser 11/20/2023 1.77 (H)  0.61 - 1.24 mg/dL Final   Calcium 96/29/5284 8.8 (L)  8.9 - 10.3 mg/dL Final   GFR, Estimated 11/20/2023 45 (L)  >60 mL/min Final   Comment: (NOTE) Calculated using the CKD-EPI Creatinine Equation (2021)    Anion gap 11/20/2023 11  5 - 15 Final   Performed at [facility], [address] [place], [place] [address]   Glucose-Capillary 11/19/2023 100 (H)  70 - 99 mg/dL Final   Glucose reference range  applies only to samples taken after fasting for at least 8 hours.   Glucose-Capillary 11/20/2023 162 (H)  70 - 99 mg/dL Final   Glucose reference range applies only to samples taken after fasting for at least 8 hours.  Hospital Outpatient Visit on 11/13/2023  Component Date Value Ref Range Status   Sodium 11/13/2023 139  135 - 145 mmol/L Final   Potassium 11/13/2023 3.6  3.5 - 5.1 mmol/L Final   Chloride 11/13/2023 106  98 - 111 mmol/L Final   CO2 11/13/2023 27  22 - 32 mmol/L Final   Glucose, Bld 11/13/2023 75  70 - 99 mg/dL Final   Glucose reference range applies only to samples taken after fasting for at least 8 hours.   BUN 11/13/2023 15  6 - 20 mg/dL Final   Creatinine, Ser 11/13/2023 1.51 (H)  0.61 - 1.24 mg/dL Final   Calcium 12/19/7251 8.7 (L)  8.9 - 10.3 mg/dL Final   GFR, Estimated 11/13/2023 54 (L)  >60 mL/min Final   Comment: (NOTE) Calculated using the CKD-EPI Creatinine Equation (2021)    Anion gap 11/13/2023 6  5 - 15 Final   Performed at [facility], [address], [place], [place] [address]   WBC 11/13/2023 5.6  4.0 - 10.5 K/uL Final   RBC 11/13/2023 4.49  4.22 - 5.81 MIL/uL Final   Hemoglobin 11/13/2023 12.9 (L)  13.0 - 17.0 g/dL Final   HCT 34/74/2595 39.8  39.0 - 52.0 % Final   MCV 11/13/2023 88.6  80.0 - 100.0 fL Final   MCH 11/13/2023 28.7  26.0 - 34.0 pg Final   MCHC 11/13/2023 32.4  30.0 - 36.0 g/dL Final   RDW 63/87/5643 12.8  11.5 - 15.5 % Final   Platelets 11/13/2023 248  150 - 400 K/uL Final   nRBC 11/13/2023 0.0  0.0 - 0.2 % Final   Performed at [facility], [address], [place], [place] [address]   B Natriuretic Peptide 11/13/2023 372.4 (H)  0.0 - 100.0 pg/mL Final   Performed at  [facility], [address] [place] [place], [place] [address]   Opiates 11/13/2023 NONE DETECTED  NONE DETECTED Final   Cocaine 11/13/2023 POSITIVE (A)  NONE DETECTED Final   Benzodiazepines 11/13/2023 NONE DETECTED  NONE DETECTED Final   Amphetamines  11/13/2023 NONE DETECTED  NONE DETECTED Final   Tetrahydrocannabinol 11/13/2023 NONE DETECTED  NONE DETECTED Final   Barbiturates 11/13/2023 NONE DETECTED  NONE DETECTED Final   Comment: (NOTE) DRUG SCREEN FOR MEDICAL PURPOSES ONLY.  IF CONFIRMATION IS NEEDED FOR ANY PURPOSE, NOTIFY LAB WITHIN 5 DAYS.  LOWEST DETECTABLE LIMITS FOR URINE DRUG SCREEN Drug Class                     Cutoff (ng/mL) Amphetamine and metabolites    1000 Barbiturate and metabolites    200 Benzodiazepine                 200 Opiates and metabolites        300 Cocaine and metabolites        300 THC                            50 Performed at [facility], [address] [place], [place] [address]   Office Visit on 11/04/2023  Component Date Value Ref Range Status   HbA1c, POC (controlled diabetic ra* 11/04/2023 6.1  0.0 - 7.0 % Final  Admission on 08/05/2023, Discharged on 08/06/2023  Component Date Value Ref Range Status   Sodium 08/05/2023 135  135 - 145 mmol/L Final   Potassium 08/05/2023 3.8  3.5 - 5.1 mmol/L Final   Chloride 08/05/2023 103  98 - 111 mmol/L Final   CO2 08/05/2023 22  22 - 32 mmol/L Final   Glucose, Bld 08/05/2023 129 (H)  70 - 99 mg/dL Final   Glucose reference range applies only to samples taken after fasting for at least 8 hours.   BUN 08/05/2023 9  6 - 20 mg/dL Final   Creatinine, Ser 08/05/2023 1.46 (H)  0.61 - 1.24 mg/dL Final   Calcium 02/72/5366 9.0  8.9 - 10.3 mg/dL Final   GFR, Estimated 08/05/2023 56 (L)  >60 mL/min Final   Comment: (NOTE) Calculated using the CKD-EPI Creatinine Equation (2021)    Anion gap 08/05/2023 10  5 - 15 Final   Performed at [facility], [address], [place], [place] [address]   WBC 08/05/2023 3.8 (L)  4.0 - 10.5 K/uL Final   RBC 08/05/2023 5.05  4.22 - 5.81 MIL/uL Final   Hemoglobin 08/05/2023 14.7  13.0 - 17.0 g/dL Final   HCT 74/25/9563 43.4  39.0 - 52.0 % Final   MCV 08/05/2023 85.9  80.0 - 100.0 fL Final   MCH 08/05/2023  29.1  26.0 - 34.0 pg Final   MCHC 08/05/2023 33.9  30.0 - 36.0 g/dL Final   RDW 87/56/4332 12.6  11.5 - 15.5 % Final   Platelets 08/05/2023 250  150 - 400 K/uL Final   nRBC 08/05/2023 0.0  0.0 - 0.2 % Final   Performed at [facility], [address], [place], [place] [address]   Troponin I (High Sensitivity) 08/05/2023 17  <18 ng/L Final   Comment: (NOTE) Elevated high sensitivity troponin I (hsTnI) values and significant  changes across serial measurements may suggest ACS but many other  chronic and acute conditions are known to elevate hsTnI results.  Refer to  the Links section for chest pain algorithms and additional  guidance. Performed at [facility], [address] [place], [place] [address]     Allergies: Patient has no allergy information on record.  Medications:  Facility Ordered Medications  Medication   [COMPLETED] melatonin tablet 10 mg   PTA Medications  Medication Sig   aspirin EC 81 MG tablet Take 1 tablet (81 mg total) by mouth daily. Swallow whole. (Patient not taking: Reported on 11/16/2023)   timolol (TIMOPTIC) 0.5 % ophthalmic solution Place 1 drop into both eyes daily after breakfast. (Patient not taking: Reported on 11/16/2023)   melatonin 5 MG TABS Take 1 tablet (5 mg total) by mouth at bedtime. (Patient not taking: Reported on 11/16/2023)   gabapentin (NEURONTIN) 300 MG capsule Take 1 capsule (300 mg total) by mouth at bedtime.   metFORMIN (GLUCOPHAGE-XR) 500 MG 24 hr tablet Take 1 tablet (500 mg total) by mouth daily with breakfast.   sertraline (ZOLOFT) 100 MG tablet Take 1 tablet (100 mg total) by mouth daily.   acetaminophen (TYLENOL) 325 MG tablet Take 2 tablets (650 mg total) by mouth every 6 (six) hours as needed for mild pain (pain score 1-3) (or Fever >/= 101).   atorvastatin (LIPITOR) 80 MG tablet Take 1 tablet (80 mg total) by mouth daily.   BIDIL 20-37.5 MG tablet Take 2 tablets by mouth 3 (three) times daily.   ezetimibe (ZETIA)  10 MG tablet Take 1 tablet (10 mg total) by mouth daily.   furosemide (LASIX) 40 MG tablet Take 1 tablet (40 mg total) by mouth daily.   losartan (COZAAR) 100 MG tablet Take 1 tablet (100 mg total) by mouth daily.   spironolactone (ALDACTONE) 25 MG tablet Take 1 tablet (25 mg total) by mouth daily.      Medical Decision Making  Inpatient observation  Lab Orders         SARS Coronavirus 2 by RT PCR (hospital order, performed in [facility] hospital lab) *cepheid single result test* Anterior Nasal Swab         CBC with Differential/Platelet         Comprehensive metabolic panel         Ethanol         TSH         POCT Urine Drug Screen - (I-Screen)      Meds ordered this encounter  Medications   DISCONTD: acetaminophen (TYLENOL) tablet 650 mg   DISCONTD: alum & mag hydroxide-simeth (MAALOX/MYLANTA) 200-200-20 MG/5ML suspension 30 mL   DISCONTD: magnesium hydroxide (MILK OF MAGNESIA) suspension 30 mL   DISCONTD: OLANZapine zydis (ZYPREXA) disintegrating tablet 5 mg   DISCONTD: OLANZapine (ZYPREXA) injection 5 mg   DISCONTD: OLANZapine (ZYPREXA) injection 10 mg   DISCONTD: aspirin EC tablet 81 mg   DISCONTD: atorvastatin (LIPITOR) tablet 80 mg   DISCONTD: isosorbide-hydrALAZINE (BIDIL) 20-37.5 MG per tablet 2 tablet   DISCONTD: ezetimibe (ZETIA) tablet 10 mg   DISCONTD: gabapentin (NEURONTIN) capsule 300 mg   DISCONTD: losartan (COZAAR) tablet 100 mg   DISCONTD: melatonin tablet 5 mg   DISCONTD: metFORMIN (GLUCOPHAGE-XR) 24 hr tablet 500 mg   DISCONTD: sertraline (ZOLOFT) tablet 100 mg   DISCONTD: spironolactone (ALDACTONE) tablet 25 mg   DISCONTD: timolol (TIMOPTIC) 0.5 % ophthalmic solution 1 drop   DISCONTD: furosemide (LASIX) tablet 40 mg   DISCONTD: acetaminophen (TYLENOL) tablet 650 mg   DISCONTD: isosorbide dinitrate (ISORDIL) tablet 40 mg   DISCONTD: hydrALAZINE (APRESOLINE) tablet 75  mg   DISCONTD: melatonin tablet 10 mg   melatonin tablet 10 mg        Recommendations  Based on my evaluation the patient does not appear to have an emergency medical condition.  Sindy Guadeloupe, NP 01/14/24  5:16 AM   Isa Rankin, MD 01/19/24 1500

## 2024-01-13 NOTE — BH Assessment (Signed)
Comprehensive Clinical Assessment (CCA) Note  01/13/2024 Matthew [name] [id number]  Disposition: Per Sindy Guadeloupe, NP, patient meets criteria for inpatient psychiatric treatment. Disposition Counselor to seek appropriate placement.   Chief Complaint:  Chief Complaint  Patient presents with   Suicidal   Visit Diagnosis:  Major Depressive Disorder, Recurrent, Severe without Psychotic Features DSM-IV Code: 296.33  Generalized Anxiety Disorder DSM-IV Code: 300.02  Substance Use Disorder, Cocaine DSM-IV Code: 304.20  Unspecified Visual Impairment DSM-IV Code: 369.00  Adjustment Disorder with Mixed Anxiety and Depressed Mood DSM-IV Code: 309.28  Matthew [name], a 58 year old male, presents [facility] to the [facility]), escorted by police from his residence. 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 His primary complaint is related to "bad thoughts and anxiety." He endorses current suicidal ideation but denies any specific plan or intent and reports no history of suicide attempts or gestures. He also denies access to means. The main stressors identified are the death of his spouse in 01/26/2020 and ongoing health issues. He denies homicidal ideation. Sheila reports a history of auditory hallucinations, which typically occur during depressive and anxious episodes, with voices instructing him to "end it." He denies experiencing any visual hallucinations. He admits to using cocaine approximately once a month, with a typical use of 1 gram, the most recent use occurring the previous Friday. He denies alcohol use. Matthew [name] does not have an outpatient psychiatric provider and is unsure about his psychotropic medications. He states that he has not taken any medications in the past 1-2 weeks. He lives alone and has two adult children which he has not  relationship with at this time. During triage, Matthew [name] mentioned being in a wheelchair due to anxiety, which caused his knees to buckle.  On mental status examination (MSE), Matthew [name] is alert and oriented to person, place, and time. His speech is clear, coherent, and at a normal rate. His mood is reported as "anxious" and his affect is congruent with his mood. He demonstrates some psychomotor agitation, including fidgeting and restlessness. Thought processes are logical and goal-directed, but there is some evidence of rumination regarding his anxiety and suicidal ideation. He denies any delusions or paranoia. His judgment and insight appear to be impaired, as he expresses uncertainty regarding his medications and treatment. He denies any visual hallucinations, but does acknowledge auditory hallucinations during depressive episodes. His cognitive functions, including memory and attention, appear intact. He denies current suicidal intent.  CCA Screening, Triage and Referral (STR)  Patient Reported Information How did you hear about [place]? Legal System  What Is the Reason for Your Visit/Call Today? Summary: The following contact information was provided for future reference:  Landlord [address]): Phone number [phone] Clearence Cheek Erline Levine): Phone number [phone] This information is available in case further collateral information or assistance is needed.  How Long Has This Been Causing You Problems? <Week  What Do You Feel Would Help You the Most Today? Treatment for Depression or other mood problem   Have You Recently Had Any Thoughts About Hurting Yourself? Yes  Are You Planning to Commit Suicide/Harm Yourself At This time? No   Flowsheet Row ED from 01/13/2024 in [facility] ED to Hosp-Admission (Discharged) from 11/16/2023 in [facility] ED from 08/05/2023 in [facility] Emergency Department at [facility]  C-SSRS RISK CATEGORY Low Risk No Risk No  Risk  Have you Recently Had Thoughts About Hurting Someone [name]? No  Are You Planning to Harm Someone at This Time? No  Explanation: Patient denies.   Have You Used Any Alcohol or Drugs in the Past 24 Hours? No  How Long Ago Did You Use Drugs or Alcohol? This past Friday patient reports he used cocaine.  What Did You Use and How Much? Yes, cocaine. Patient states that he last used cocaine, last Friday, 1 gram.   Do You Currently Have a Therapist/Psychiatrist? No  Name of Therapist/Psychiatrist:    Have You Been Recently Discharged From Any Office Practice or Programs? No  Explanation of Discharge From Practice/Program: N/A    CCA Screening Triage Referral Assessment Type of Contact: Face-to-Face  Telemedicine Service Delivery:   Is this Initial or Reassessment?   Date Telepsych consult ordered in [facility]:    Time Telepsych consult ordered in [facility]:    Location of Assessment: [facility] Services  Provider Location: [facility] Assessment Services   Collateral Involvement: None at this time.   Does Patient Have a Automotive engineer Guardian? No  Legal Guardian Contact Information: No legal guardian.  Copy of Legal Guardianship Form: No - copy requested  Legal Guardian Notified of Arrival: -- (No guardian reported.)  Legal Guardian Notified of Pending Discharge: -- (No guardian reported.)  If Minor and Not Living with Parent(s), Who has Custody? n/a  Is CPS involved or ever been involved? Never  Is APS involved or ever been involved? Never   Patient Determined To Be At Risk for Harm To Self or Others Based on Review of Patient Reported Information or Presenting Complaint? Yes, for Self-Harm  Method: No Plan  Availability of Means: No access or NA  Intent: Vague intent or NA  Notification Required: No need or identified person  Additional Information for Danger to Others Potential: Patient denies that he is a danger to others. Additional Comments  for Danger to Others Potential: No HI. Patient denies that he is a danger to others.  Are There Guns or Other Weapons in Your Home? No  Types of Guns/Weapons: Patient denies.  Are These Weapons Safely Secured?                            -- (Patient denies access to weapons.)  Who Could Verify You Are Able To Have These Secured: n/a; no access to weapons.  Do You Have any Outstanding Charges, Pending Court Dates, Parole/Probation? n/a  Contacted To Inform of Risk of Harm To Self or Others: Other: Comment    Does Patient Present under Involuntary Commitment? No    [place] of Residence: [place]   Patient Currently Receiving the Following Services: Patient denies.  Determination of Need: Routine (7 days)   Options For Referral: Medication Management; Inpatient Hospitalization     CCA Biopsychosocial Patient Reported Schizophrenia/Schizoaffective Diagnosis in Past: No   Strengths: Pt is willing to participate in treatment   Mental Health Symptoms Depression:  Change in energy/activity   Duration of Depressive symptoms: Duration of Depressive Symptoms: Greater than two weeks   Mania:  None Reported  Anxiety:   Worrying; Difficulty concentrating   Psychosis:  None   Duration of Psychotic symptoms:    Trauma:  None   Obsessions:  None   Compulsions:  None   Inattention:  None   Hyperactivity/Impulsivity:  None   Oppositional/Defiant Behaviors:  None   Emotional Irregularity:  None  Other Mood/Personality Symptoms:  Patient has a depressed mood.    Mental Status Exam Appearance and self-care  Stature:  Average   Weight:  Appropriate   Clothing: Appropriate.   Grooming:  Fair  Cosmetic use:  None   Posture/gait:  Good  Motor activity: restless and anxious  Sensorium  Attention:  Distractible   Concentration:  Variable   Orientation:  Person; Place; Situation   Recall/memory:  Defective in Recent   Affect and Mood  Affect:  Anxious    Mood:  Anxious; Depressed   Relating  Eye contact:  Fleeting   Facial expression:  Anxious; Fearful   Attitude toward examiner:  Cooperative   Thought and Language  Speech flow: Soft   Thought content:  Appropriate to Mood and Circumstances   Preoccupation:  Suicide   Hallucinations:  Auditory   Organization:  Engineer, site of Knowledge:  Fair   Intelligence:  Average   Abstraction:  Normal   Judgement:  Fair   Dance movement psychotherapist:  Variable   Insight:  Fair   Decision Making:  Normal   Social Functioning  Social Maturity:  Responsible   Social Judgement:  Victimized   Stress  Stressors:  Grief/losses; Housing; Transitions; Financial   Coping Ability:  Exhausted   Skill Deficits:  None   Supports:  Friends/Service system     Religion: Religion/Spirituality Are You A Religious Person?: No How Might This Affect Treatment?: n/a  Leisure/Recreation: Leisure / Recreation Do You Have Hobbies?: No  Exercise/Diet: Exercise/Diet Do You Exercise?: No Have You Gained or Lost A Significant Amount of Weight in the Past Six Months?: No Do You Follow a Special Diet?: No   CCA Employment/Education Employment/Work Situation: Employment / Work Situation Employment Situation: Unemployed Patient's Job has Been Impacted by Current Illness: Yes Describe how Patient's Job has Been Impacted: He shares he has been out of work "off and on" due to hospitalizations. He also states that he has issues with concentration and "getting out of breath real fast." Has Patient ever Been in the Military?: No  Education: Education Is Patient Currently Attending School?: No Last Grade Completed:  (12th grade) Did You Attend College?: No Did You Have An Individualized Education Program (IIEP): No Did You Have Any Difficulty At School?: No Patient's Education Has Been Impacted by Current Illness: No   CCA Family/Childhood History Family and  Relationship History: Family history Marital status: Widowed Widowed, when?: "For two years" Does patient have children?: Yes How many children?: 2 How is patient's relationship with their children?: Pt reports he doesn't see his children that often  Childhood History:  Childhood History Did patient suffer any verbal/emotional/physical/sexual abuse as a child?: No Did patient suffer from severe childhood neglect?: No Has patient ever been sexually abused/assaulted/raped as an adolescent or adult?: No Was the patient ever a victim of a crime or a disaster?: No Witnessed domestic violence?: No Has patient been affected by domestic violence as an adult?: No       CCA Substance Use Alcohol/Drug Use: Alcohol / Drug Use Pain Medications: See MAR Prescriptions: See MAR Over the Counter: See MAR History of alcohol / drug use?: Yes Longest period of sobriety (when/how long): Unknown Negative Consequences of Use: Work / Programmer, multimedia, Personal relationships Withdrawal Symptoms: None Substance #1 Name of Substance 1: Cocaine 1 - Age of First Use: 20's 1 - Amount (size/oz): 1 gram per use 1 - Frequency: 1x per month 1 - Duration: on-going 1 -  Last Use / Amount: Last Friday 1 - Method of Aquiring: varies 1- Route of Use: snorting                       ASAM's:  Six Dimensions of Multidimensional Assessment  Dimension 1:  Acute Intoxication and/or Withdrawal Potential:      Dimension 2:  Biomedical Conditions and Complications:      Dimension 3:  Emotional, Behavioral, or Cognitive Conditions and Complications:     Dimension 4:  Readiness to Change:     Dimension 5:  Relapse, Continued use, or Continued Problem Potential:     Dimension 6:  Recovery/Living Environment:     ASAM Severity Score:    ASAM Recommended Level of Treatment: ASAM Recommended Level of Treatment: Level II Intensive Outpatient Treatment   Substance use Disorder (SUD) Substance Use Disorder (SUD)   Checklist Symptoms of Substance Use: Continued use despite having a persistent/recurrent physical/psychological problem caused/exacerbated by use, Continued use despite persistent or recurrent social, interpersonal problems, caused or exacerbated by use, Presence of craving or strong urge to use, Repeated use in physically hazardous situations, Recurrent use that results in a failure to fulfill major role obligations (work, school, home), Social, occupational, recreational activities given up or reduced due to use, Persistent desire or unsuccessful efforts to cut down or control use, Evidence of tolerance, Evidence of withdrawal (Comment)  Recommendations for Services/Supports/Treatments: Recommendations for Services/Supports/Treatments Recommendations For Services/Supports/Treatments: Medication Management, Inpatient Hospitalization  Disposition Recommendation per psychiatric provider: We recommend inpatient psychiatric hospitalization when medically cleared. Patient is under voluntary admission status at this time; please IVC if attempts to leave hospital.   DSM5 Diagnoses: Patient Active Problem List   Diagnosis Date Noted   Acute on chronic hypoxic respiratory failure (HCC) 11/16/2023   Acute pulmonary edema (HCC) 11/16/2023   Cocaine use 11/16/2023   Acute hypoxic respiratory failure (HCC) 11/16/2023   GAD (generalized anxiety disorder) 06/28/2022   Cocaine abuse (HCC) 06/28/2022   MDD (major depressive disorder), recurrent episode (HCC) 04/21/2022   MDD (major depressive disorder), recurrent, severe, with psychosis (HCC) 02/16/2022   MDD (major depressive disorder), recurrent episode, severe (HCC) 02/15/2022   Impacted cerumen of right ear 03/24/2021   MDD (major depressive disorder), recurrent severe, without psychosis (HCC) 01/03/2021   Decompensated heart failure (HCC) 12/24/2020   Acute respiratory failure with hypoxia (HCC) 02/15/2020   Acute on chronic combined systolic and  diastolic CHF (congestive heart failure) (HCC) 02/15/2020   Hypertensive urgency 02/15/2020   CHF (congestive heart failure) (HCC) 02/15/2020   Depression due to physical illness 11/18/2018   Expressive aphasia 02/20/2017   Mixed hyperlipidemia    History of stroke 01/31/2017   Erectile dysfunction 07/30/2016   OSA (obstructive sleep apnea) 05/10/2015   CKD (chronic kidney disease) stage 2, GFR 60-89 ml/min 03/10/2014   Diabetes mellitus type II, controlled (HCC) 03/10/2014   Chronic combined systolic and diastolic congestive heart failure (HCC) 03/03/2014   Smoker    Morbid obesity (HCC)    Hypertension 03/02/2014     Referrals to Alternative Service(s): Referred to Alternative Service(s):   Place:   Date:   Time:    Referred to Alternative Service(s):   Place:   Date:   Time:    Referred to Alternative Service(s):   Place:   Date:   Time:    Referred to Alternative Service(s):   Place:   Date:   Time:     Melynda Ripple, Counselor

## 2024-01-14 ENCOUNTER — Encounter (HOSPITAL_COMMUNITY): Payer: Self-pay | Admitting: Psychiatry

## 2024-01-14 ENCOUNTER — Other Ambulatory Visit: Payer: Self-pay

## 2024-01-14 ENCOUNTER — Inpatient Hospital Stay (HOSPITAL_COMMUNITY)
Admission: AD | Admit: 2024-01-14 | Discharge: 2024-01-17 | DRG: 880 | Disposition: A | Discharge status: Home / Self Care | Payer: Medicare Other | Source: Intra-hospital | Attending: Psychiatry | Admitting: Psychiatry

## 2024-01-14 DIAGNOSIS — F1420 Cocaine dependence, uncomplicated: Secondary | ICD-10-CM | POA: Diagnosis present

## 2024-01-14 DIAGNOSIS — F17200 Nicotine dependence, unspecified, uncomplicated: Secondary | ICD-10-CM | POA: Diagnosis present

## 2024-01-14 DIAGNOSIS — F17210 Nicotine dependence, cigarettes, uncomplicated: Secondary | ICD-10-CM | POA: Diagnosis present

## 2024-01-14 DIAGNOSIS — I130 Hypertensive heart and chronic kidney disease with heart failure and stage 1 through stage 4 chronic kidney disease, or unspecified chronic kidney disease: Secondary | ICD-10-CM | POA: Diagnosis present

## 2024-01-14 DIAGNOSIS — E7800 Pure hypercholesterolemia, unspecified: Secondary | ICD-10-CM | POA: Diagnosis present

## 2024-01-14 DIAGNOSIS — G4733 Obstructive sleep apnea (adult) (pediatric): Secondary | ICD-10-CM | POA: Diagnosis present

## 2024-01-14 DIAGNOSIS — H538 Other visual disturbances: Secondary | ICD-10-CM

## 2024-01-14 DIAGNOSIS — I429 Cardiomyopathy, unspecified: Secondary | ICD-10-CM | POA: Diagnosis present

## 2024-01-14 DIAGNOSIS — Z8049 Family history of malignant neoplasm of other genital organs: Secondary | ICD-10-CM

## 2024-01-14 DIAGNOSIS — N1831 Chronic kidney disease, stage 3a: Secondary | ICD-10-CM | POA: Diagnosis present

## 2024-01-14 DIAGNOSIS — I5042 Chronic combined systolic (congestive) and diastolic (congestive) heart failure: Secondary | ICD-10-CM | POA: Diagnosis present

## 2024-01-14 DIAGNOSIS — Z5986 Financial insecurity: Secondary | ICD-10-CM

## 2024-01-14 DIAGNOSIS — Z8249 Family history of ischemic heart disease and other diseases of the circulatory system: Secondary | ICD-10-CM

## 2024-01-14 DIAGNOSIS — R4589 Other symptoms and signs involving emotional state: Secondary | ICD-10-CM | POA: Diagnosis not present

## 2024-01-14 DIAGNOSIS — Z7982 Long term (current) use of aspirin: Secondary | ICD-10-CM

## 2024-01-14 DIAGNOSIS — E1169 Type 2 diabetes mellitus with other specified complication: Secondary | ICD-10-CM

## 2024-01-14 DIAGNOSIS — Z8673 Personal history of transient ischemic attack (TIA), and cerebral infarction without residual deficits: Secondary | ICD-10-CM

## 2024-01-14 DIAGNOSIS — F0394 Unspecified dementia, unspecified severity, with anxiety: Secondary | ICD-10-CM | POA: Diagnosis present

## 2024-01-14 DIAGNOSIS — F329 Major depressive disorder, single episode, unspecified: Secondary | ICD-10-CM | POA: Diagnosis present

## 2024-01-14 DIAGNOSIS — E1121 Type 2 diabetes mellitus with diabetic nephropathy: Secondary | ICD-10-CM

## 2024-01-14 DIAGNOSIS — F0393 Unspecified dementia, unspecified severity, with mood disturbance: Secondary | ICD-10-CM | POA: Diagnosis present

## 2024-01-14 DIAGNOSIS — F411 Generalized anxiety disorder: Secondary | ICD-10-CM | POA: Diagnosis present

## 2024-01-14 DIAGNOSIS — E1122 Type 2 diabetes mellitus with diabetic chronic kidney disease: Secondary | ICD-10-CM | POA: Diagnosis present

## 2024-01-14 DIAGNOSIS — I110 Hypertensive heart disease with heart failure: Secondary | ICD-10-CM

## 2024-01-14 DIAGNOSIS — F339 Major depressive disorder, recurrent, unspecified: Secondary | ICD-10-CM

## 2024-01-14 DIAGNOSIS — R45851 Suicidal ideations: Secondary | ICD-10-CM | POA: Diagnosis present

## 2024-01-14 DIAGNOSIS — F410 Panic disorder [episodic paroxysmal anxiety] without agoraphobia: Secondary | ICD-10-CM | POA: Diagnosis present

## 2024-01-14 DIAGNOSIS — Z79899 Other long term (current) drug therapy: Secondary | ICD-10-CM | POA: Diagnosis not present

## 2024-01-14 DIAGNOSIS — Z7984 Long term (current) use of oral hypoglycemic drugs: Secondary | ICD-10-CM | POA: Diagnosis not present

## 2024-01-14 DIAGNOSIS — H5461 Unqualified visual loss, right eye, normal vision left eye: Secondary | ICD-10-CM | POA: Diagnosis present

## 2024-01-14 DIAGNOSIS — I509 Heart failure, unspecified: Secondary | ICD-10-CM

## 2024-01-14 MED ORDER — FUROSEMIDE 40 MG PO TABS
40.0000 mg | ORAL_TABLET | Freq: Every day | ORAL | Status: DC
  Administered 2024-01-14 – 2024-01-16 (×3): 40 mg via ORAL
  Filled 2024-01-14 (×6): qty 1

## 2024-01-14 MED ORDER — LOSARTAN POTASSIUM 50 MG PO TABS
100.0000 mg | ORAL_TABLET | Freq: Every day | ORAL | Status: DC
  Administered 2024-01-14: 100 mg via ORAL
  Filled 2024-01-14 (×4): qty 2

## 2024-01-14 MED ORDER — EZETIMIBE 10 MG PO TABS
10.0000 mg | ORAL_TABLET | Freq: Every day | ORAL | Status: DC
  Administered 2024-01-14 – 2024-01-16 (×3): 10 mg via ORAL
  Filled 2024-01-14 (×6): qty 1

## 2024-01-14 MED ORDER — OLANZAPINE 10 MG IM SOLR: 10.0000 mg | Freq: Three times a day (TID) | INTRAMUSCULAR | Status: DC | PRN

## 2024-01-14 MED ORDER — SERTRALINE HCL 100 MG PO TABS
100.0000 mg | ORAL_TABLET | Freq: Once | ORAL | Status: AC
  Administered 2024-01-14: 100 mg via ORAL
  Filled 2024-01-14 (×2): qty 1

## 2024-01-14 MED ORDER — OLANZAPINE 5 MG PO TBDP: 5.0000 mg | ORAL_TABLET | Freq: Three times a day (TID) | ORAL | Status: DC | PRN

## 2024-01-14 MED ORDER — ATORVASTATIN CALCIUM 80 MG PO TABS
80.0000 mg | ORAL_TABLET | Freq: Every day | ORAL | Status: DC
  Administered 2024-01-14 – 2024-01-16 (×3): 80 mg via ORAL
  Filled 2024-01-14: qty 1
  Filled 2024-01-14: qty 2
  Filled 2024-01-14 (×4): qty 1

## 2024-01-14 MED ORDER — GABAPENTIN 100 MG PO CAPS
100.0000 mg | ORAL_CAPSULE | Freq: Three times a day (TID) | ORAL | Status: DC
  Administered 2024-01-14 – 2024-01-17 (×9): 100 mg via ORAL
  Filled 2024-01-14 (×19): qty 1

## 2024-01-14 MED ORDER — NICOTINE 14 MG/24HR TD PT24: 14.0000 mg | MEDICATED_PATCH | Freq: Every day | TRANSDERMAL | Status: DC | PRN

## 2024-01-14 MED ORDER — MELATONIN 5 MG PO TABS
10.0000 mg | ORAL_TABLET | ORAL | Status: AC
  Administered 2024-01-14: 10 mg via ORAL
  Filled 2024-01-14: qty 2

## 2024-01-14 MED ORDER — METFORMIN HCL ER 500 MG PO TB24
500.0000 mg | ORAL_TABLET | Freq: Every day | ORAL | Status: DC
  Administered 2024-01-14 – 2024-01-16 (×3): 500 mg via ORAL
  Filled 2024-01-14 (×6): qty 1

## 2024-01-14 MED ORDER — HYDROCORTISONE 1 % EX CREA
TOPICAL_CREAM | Freq: Three times a day (TID) | CUTANEOUS | Status: DC
  Administered 2024-01-14: 1 via TOPICAL
  Filled 2024-01-14 (×2): qty 28

## 2024-01-14 MED ORDER — OLANZAPINE 10 MG IM SOLR: 5.0000 mg | Freq: Three times a day (TID) | INTRAMUSCULAR | Status: DC | PRN

## 2024-01-14 MED ORDER — ASPIRIN 81 MG PO TBEC
81.0000 mg | DELAYED_RELEASE_TABLET | Freq: Every day | ORAL | Status: DC
Start: 2024-01-15 — End: 2024-01-16
  Administered 2024-01-15 – 2024-01-16 (×2): 81 mg via ORAL
  Filled 2024-01-14 (×4): qty 1

## 2024-01-14 MED ORDER — ISOSORB DINITRATE-HYDRALAZINE 20-37.5 MG PO TABS
2.0000 | ORAL_TABLET | Freq: Three times a day (TID) | ORAL | Status: DC
  Administered 2024-01-14 (×2): 2 via ORAL
  Filled 2024-01-14 (×7): qty 2

## 2024-01-14 MED ORDER — ACETAMINOPHEN 325 MG PO TABS: 650.0000 mg | ORAL_TABLET | Freq: Four times a day (QID) | ORAL | Status: DC | PRN

## 2024-01-14 MED ORDER — MELATONIN 5 MG PO TABS: 10.0000 mg | ORAL_TABLET | Freq: Every day | ORAL | Status: DC

## 2024-01-14 MED ORDER — SERTRALINE HCL 100 MG PO TABS
100.0000 mg | ORAL_TABLET | Freq: Every day | ORAL | Status: DC
  Filled 2024-01-14 (×2): qty 1

## 2024-01-14 MED ORDER — HYDROXYZINE HCL 25 MG PO TABS
25.0000 mg | ORAL_TABLET | Freq: Three times a day (TID) | ORAL | Status: DC | PRN
  Administered 2024-01-14: 25 mg via ORAL
  Filled 2024-01-14: qty 1

## 2024-01-14 MED ORDER — GABAPENTIN 300 MG PO CAPS
300.0000 mg | ORAL_CAPSULE | Freq: Every day | ORAL | Status: DC
  Filled 2024-01-14 (×2): qty 1

## 2024-01-14 MED ORDER — SPIRONOLACTONE 25 MG PO TABS
25.0000 mg | ORAL_TABLET | Freq: Every day | ORAL | Status: DC
  Administered 2024-01-14 – 2024-01-16 (×3): 25 mg via ORAL
  Filled 2024-01-14 (×7): qty 1

## 2024-01-14 MED ORDER — HYDROXYZINE HCL 10 MG PO TABS
10.0000 mg | ORAL_TABLET | Freq: Three times a day (TID) | ORAL | Status: DC | PRN
  Administered 2024-01-14 – 2024-01-16 (×3): 10 mg via ORAL
  Filled 2024-01-14 (×3): qty 1

## 2024-01-14 MED ORDER — SERTRALINE HCL 25 MG PO TABS
125.0000 mg | ORAL_TABLET | Freq: Every day | ORAL | Status: DC
  Administered 2024-01-15: 125 mg via ORAL
  Filled 2024-01-14 (×3): qty 1

## 2024-01-14 NOTE — Progress Notes (Signed)
   01/14/24 1300  Psych Admission Type (Psych Patients Only)  Admission Status Voluntary  Psychosocial Assessment  Patient Complaints Anxiety  Eye Contact Fair  Facial Expression Anxious  Affect Appropriate to circumstance  Speech Logical/coherent  Interaction Forwards little  Motor Activity Slow  Appearance/Hygiene Unremarkable  Behavior Characteristics Appropriate to situation;Cooperative  Mood Depressed;Pleasant;Anxious  Aggressive Behavior  Effect No apparent injury  Thought Process  Coherency WDL  Content WDL  Delusions None reported or observed  Perception WDL  Hallucination None reported or observed  Judgment Poor  Confusion None  Danger to Self  Current suicidal ideation? Denies  Agreement Not to Harm Self Yes  Description of Agreement verbal  Danger to Others  Danger to Others None reported or observed

## 2024-01-14 NOTE — H&P (Signed)
Psychiatric Admission Assessment Adult  Patient Identification: Matthew [name]. MRN:  [id number] Date of Evaluation:  01/14/2024 Chief Complaint:  MDD (major depressive disorder) [F32.9] Principal Diagnosis: Panic disorder Diagnosis:  Principal Problem:   Panic disorder Active Problems:   Nicotine use disorder   GAD (generalized anxiety disorder)   Cocaine use disorder, severe, dependence (HCC)    CC: "worsening anxiety"  Matthew Myler. is a 58 y.o. male  with a past psychiatric history of MDD, GAD, stimulant use disorder (cocaine), hospitalizations in 2022 and 2023 for SI. Patient initially arrived to [facility] on 01/13/24 for panic attacks, and admitted to [facility]untary on 01/14/24 for stabilization of acute on chronic psychiatric conditions. PMHx is significant for HFrEF, HTN, T2DM, prior CVA, and CKD 3a.   HPI:  Patient reports that he is presenting with worsening anxiety and describes that he has episodes in which his heart races, he has difficulty breathing, and he "feels very anxious."  He reports that they have gotten worse recently.  Regarding stressors in his life over the past several months he reports that he has had issues with his finances.  Recently his "payee" was unable to cover his electric bill, and he reports that he is "unsure" if the electricity is out, which triggered his anxiety more and led him to current presentation.  He reports that his anxiety/panic attacks are brought on by stressors in his life but also reports that he worries too much about different things.  He reports he has issues with becoming easily annoyed and irritable as a result of this anxiety.  He does report that he worries about when his next pack attack will be and reports that he can feel them coming on and this causing a lot of distress for him.  He denies any substances or medications that worsened this anxiety, but he does endorse using cocaine several times a month and that his last use was  on Friday.  Reports that he cannot recall any medications that been helpful for anxiety.  When prompted on medications that he has been on the past, he does endorse having some benefit from hydroxyzine but cannot recall any other medications are helpful.  Regarding mood, he does report having "depression."  However when asking him specific symptoms of depression he denies having depressed mood, anhedonia, and past suicidal thoughts recently.  He reports that his sleep generally is very good and he goes to bed around 11 and wakes up whenever his "natural alarm" wakes him up.  He reports that his appetite is good.  He denies having low energy or issues with concentration.  When asked what he believes he is depressed, he does not give a specific answer.  Regarding manic episodes currently or in lifetime, he denies increased goal-directed activity and elevated/expansive/irritable mood.  Regarding other psychiatric review of systems, he denies psychotic symptoms currently or in his lifetime including hallucinations or paranoia including while on substances.  Patient endorses losing his wife as an emotional traumatic experience.  Reports that he has distressing memories of this experience and avoids thinking about this experience and things in her mind of this experience.  Denies experiencing nightmares related to the trauma.  He does report having physical symptoms when reminded of the trauma and that they are similar to his panic attacks.  He denies blaming himself for this experience.  He reports that he sees the world somewhat negatively as a result of this experience and has trouble experiencing positive  emotions as her self this experience.  He does report some detachment from others.  He does report feeling more irritable but denies experiencing any other hyperarousal symptoms.  He reports the symptoms have been going on for greater than 6 months.  He denies experiencing any dissociation related to his  trauma.  Collateral Information: Patient reports having no family or friends.  Past Psychiatric Hx: Current Psychiatrist: None, PCP prescribes sertraline Current Therapist: None Previous Psychiatric Diagnoses: MDD, GAD, stimulant use disorder (cocaine) Psychiatric Medications: Current Sertraline 100 mg once daily for MDD Past Trazodone, reasonably discontinued in past due to patient's heart failure Prozac, lack of efficacy per chart Seroquel 200, "slept good," lack of efficacy for chart  Psychiatric Hospitalization hx: 2022 and 2023 for SI Psychotherapy hx: No history Neuromodulation history: Denies History of suicide: Denies History of homicide or aggression: Denies  Substance Use Hx: Alcohol: Denies Tobacco: Denies on interview but per [facility] patient reported he smokes  Cannabis: Denies Other Illicit drugs: Crack cocaine last use on Friday reports using 1 g "a couple times a month" Rx drug abuse: Denies Rehab hx: Per PCP note completed rehab in October 2023.  Past Medical History: PCP: [name] MD Medical Dx: Type 2 diabetes mellitus, hypertension, HFrEF (25-30%), hyperlipidemia Medications:  Atorvastatin 80 once daily Zetia 10 once daily Lasix 40 once daily Gabapentin 300 at night Metformin 500 mg 24-hour tablet once daily BiDil 20-37.5 two tablets 3 times daily Losartan 100 mg once daily Spironolactone 25 mg once daily Aspirin 81 once daily Allergies: Denies Hospitalizations: Heart failure exacerbation in 2022 x 1 in 2021 x 2, stroke in 2018, pneumonia in 2015 Surgeries: Right cataract extraction, left heart cath in 2015 TBI: Denies Seizures: Denies  Family History: Medical: Cervical cancer in mother, heart attack in father at 67 Psychiatric Dx: Denies Suicide Hx: Denies Violence/Aggression: Denies Substance use: Denies  Social History: Developmental: Did not describe except that both parents are deceased currently.  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 Yes.    Has the patient been a risk to self in the past 6 months? No.  Has the patient been a risk to self within the distant past? Yes.    Is the patient a risk to others? No.  Has the patient been a risk to others in the past 6 months? No.  Has the patient been a risk to others within the distant past? No.   [place] Scale:  Flowsheet Row [facility]) [facility]13/[facility]24 in [facility] to Hosp-Admission (Discharged) from 11/16/2023 in [facility]  C-SSRS RISK CATEGORY Low Risk Low Risk No Risk        Tobacco Screening:  Social History   Tobacco Use  Smoking Status Every Day   Types: Cigarettes  Smokeless Tobacco Former   Quit date: 11/14/2020    BH Tobacco Counseling     Are you interested in Tobacco Cessation Medications?  No,  patient refused Counseled patient on smoking cessation:  Refused/Declined practical counseling Reason Tobacco Screening Not Completed: Patient Refused Screening       Social History:  Social History   Substance and Sexual Activity  Alcohol Use No     Social History   Substance and Sexual Activity  Drug Use Yes   Types: Cocaine    Additional Social History:                           Allergies:  No Known Allergies Lab Results:  Results for orders placed or performed during the hospital  encounter of 01/13/24 (from the past 48 hours)  CBC with Differential/Platelet     Status: None   Collection Time: 01/13/24  9:47 PM  Result Value Ref Range   WBC 4.0 4.0 - 10.5 K/uL   RBC 4.61 4.22 - 5.81 MIL/uL   Hemoglobin 13.8 13.0 - 17.0 g/dL   HCT 08.6 57.8 - 46.9 %   MCV 89.4 80.0 - 100.0 fL   MCH 29.9 26.0 - 34.0 pg   MCHC 33.5 30.0 - 36.0 g/dL   RDW 62.9 52.8 - 41.3 %   Platelets 224 150 - 400 K/uL   nRBC 0.0 0.0 - 0.2 %   Neutrophils Relative % 58 %   Neutro Abs 2.3 1.7 - 7.7 K/uL   Lymphocytes Relative 32 %   Lymphs Abs 1.3 0.7 - 4.0 K/uL   Monocytes Relative 8 %   Monocytes Absolute 0.3 0.1 - 1.0 K/uL   Eosinophils Relative 1 %   Eosinophils Absolute 0.0 0.0 - 0.5 K/uL   Basophils Relative 1 %   Basophils Absolute 0.0 0.0 - 0.1 K/uL   Immature Granulocytes 0 %   Abs Immature Granulocytes 0.01 0.00 - 0.07 K/uL    Comment: Performed at [facility], [address], [place], [place] [address]  Comprehensive metabolic panel     Status: Abnormal   Collection Time: 01/13/24  9:47 PM  Result Value Ref Range   Sodium 138 135 - 145 mmol/L   Potassium 4.2 3.5 - 5.1 mmol/L   Chloride 104 98 - 111 mmol/L   CO2 26 22 - 32 mmol/L   Glucose, Bld 123 (H) 70 - 99 mg/dL    Comment: Glucose reference range applies only to samples taken after fasting for at least 8 hours.   BUN 15 6 - 20 mg/dL   Creatinine, Ser 0.27 (H) 0.61 - 1.24 mg/dL   Calcium 9.3 8.9 - 25.3 mg/dL   Total Protein 6.5 6.5 - 8.1 g/dL   Albumin 3.5 3.5 - 5.0 g/dL   AST 16 15 - 41 U/L   ALT 10 0 - 44 U/L   Alkaline Phosphatase 55 38 - 126 U/L   Total Bilirubin 0.7 0.0 - 1.2 mg/dL   GFR, Estimated 58 (L) >60 mL/min    Comment: (NOTE) Calculated using the CKD-EPI Creatinine Equation (2021)    Anion gap 8 5 - 15    Comment: Performed at [facility], [address], [place], [place] [address]  Ethanol     Status: None   Collection Time: 01/13/24  9:47 PM  Result Value Ref Range   Alcohol, Ethyl  (B) <10 <10 mg/dL    Comment: (NOTE) Lowest detectable limit for serum alcohol is 10 mg/dL.  For medical purposes only. Performed at [facility], [address] [place], [place] [address]  TSH     Status: None   Collection Time: 01/13/24  9:47 PM  Result Value Ref Range   TSH 0.772 0.350 - 4.500 uIU/mL    Comment: Performed by a 3rd Generation assay with a functional sensitivity of <=0.01 uIU/mL. Performed at [facility], [address] [place], [place] [address]   POCT Urine Drug Screen - (I-Screen)     Status: Abnormal   Collection Time: 01/13/24  9:48 PM  Result Value Ref Range   POC Amphetamine UR None Detected NONE DETECTED (Cut Off Level 1000 ng/mL)   POC Secobarbital (BAR) None Detected NONE DETECTED (Cut Off Level 300 ng/mL)   POC Buprenorphine (BUP) None Detected NONE DETECTED (Cut Off Level 10 ng/mL)   POC Oxazepam (BZO) None Detected NONE DETECTED (Cut Off Level 300 ng/mL)   POC Cocaine UR Positive (A) NONE DETECTED (Cut Off Level 300 ng/mL)   POC Methamphetamine UR None Detected NONE DETECTED (Cut Off Level 1000 ng/mL)   POC Morphine None Detected NONE DETECTED (Cut Off Level 300 ng/mL)   POC Methadone UR None Detected NONE DETECTED (Cut Off Level 300 ng/mL)   POC Oxycodone UR None Detected NONE DETECTED (Cut Off Level 100 ng/mL)   POC Marijuana UR None Detected NONE DETECTED (Cut Off Level 50 ng/mL)    Blood Alcohol level:  Lab Results  Component Value Date   ETH <10 01/13/2024   ETH <10 11/16/2023    Metabolic Disorder Labs:  Lab Results  Component Value Date   HGBA1C 6.2 (H) 11/17/2023   MPG 131.24 11/17/2023   MPG 111.15 12/27/2020   Lab Results  Component Value Date   PROLACTIN 4.8 02/14/2022   Lab Results  Component Value Date   CHOL 167 07/04/2023   TRIG 116 07/04/2023   HDL 41 07/04/2023   CHOLHDL 4.1 07/04/2023   VLDL 23 07/04/2023   LDLCALC 103 (H) 07/04/2023   LDLCALC 16 07/19/2022    Current Medications: Current  Facility-Administered Medications  Medication Dose Route Frequency Provider Last Rate Last Admin   acetaminophen (TYLENOL) tablet 650 mg  650 mg Oral Q6H PRN Sindy Guadeloupe, NP       Melene Muller ON 01/15/2024] aspirin EC tablet 81 mg  81 mg Oral Daily Meryl Dare, MD       atorvastatin (LIPITOR) tablet 80 mg  80 mg Oral Daily Meryl Dare, MD   80 mg at 01/14/24 6045   ezetimibe (ZETIA) tablet 10 mg  10 mg Oral Daily Meryl Dare, MD   10 mg at 01/14/24 4098   furosemide (LASIX) tablet 40 mg  40 mg Oral Daily Meryl Dare, MD   40 mg at 01/14/24 1191   gabapentin (NEURONTIN) capsule 100 mg  100 mg Oral Q8H Massengill, Nathan, MD       hydrocortisone cream 1 %   Topical TID Meryl Dare, MD   1 Application at 01/14/24 1245   hydrOXYzine (ATARAX) tablet 10 mg  10 mg Oral TID PRN Meryl Dare, MD       isosorbide-hydrALAZINE (BIDIL) 20-37.5 MG per tablet 2 tablet  2 tablet Oral TID Meryl Dare, MD   2 tablet at 01/14/24 1202   losartan (COZAAR) tablet 100 mg  100 mg Oral Daily Meryl Dare, MD       metFORMIN (GLUCOPHAGE-XR) 24 hr tablet 500 mg  500 mg Oral Q breakfast Meryl Dare, MD   500 mg at 01/14/24 4782   nicotine (NICODERM CQ - dosed in mg/24 hours) patch 14  mg  14 mg Transdermal Daily PRN Meryl Dare, MD       OLANZapine [facility]) injection 10 mg  10 mg Intramuscular TID PRN Sindy Guadeloupe, NP       OLANZapine (ZYPREXA) injection 5 mg  5 mg Intramuscular TID PRN Sindy Guadeloupe, NP       OLANZapine zydis (ZYPREXA) disintegrating tablet 5 mg  5 mg Oral TID PRN Sindy Guadeloupe, NP       sertraline (ZOLOFT) tablet 100 mg  100 mg Oral Once Meryl Dare, MD       Melene Muller ON 01/15/2024] sertraline (ZOLOFT) tablet 125 mg  125 mg Oral Daily Meryl Dare, MD       spironolactone (ALDACTONE) tablet 25 mg  25 mg Oral Daily Meryl Dare, MD   25 mg at 01/14/24 0806   PTA Medications: Medications Prior to Admission  Medication Sig Dispense Refill Last Dose/Taking   aspirin EC 81 MG  tablet Take 1 tablet (81 mg total) by mouth daily. Swallow whole. 30 tablet 1 Taking   atorvastatin (LIPITOR) 80 MG tablet Take 1 tablet (80 mg total) by mouth daily. 90 tablet 1 Taking   BIDIL 20-37.5 MG tablet Take 2 tablets by mouth 3 (three) times daily. 180 tablet 2 Taking   ezetimibe (ZETIA) 10 MG tablet Take 1 tablet (10 mg total) by mouth daily. 90 tablet 0 Taking   furosemide (LASIX) 40 MG tablet Take 1 tablet (40 mg total) by mouth daily. 90 tablet 3 Taking   gabapentin (NEURONTIN) 300 MG capsule Take 1 capsule (300 mg total) by mouth at bedtime. 30 capsule 3 Taking   losartan (COZAAR) 100 MG tablet Take 1 tablet (100 mg total) by mouth daily. 90 tablet 0 Taking   metFORMIN (GLUCOPHAGE-XR) 500 MG 24 hr tablet Take 1 tablet (500 mg total) by mouth daily with breakfast. 90 tablet 1 Taking   sertraline (ZOLOFT) 100 MG tablet Take 1 tablet (100 mg total) by mouth daily. 90 tablet 1 Taking   spironolactone (ALDACTONE) 25 MG tablet Take 1 tablet (25 mg total) by mouth daily. 90 tablet 1 Taking   melatonin 5 MG TABS Take 1 tablet (5 mg total) by mouth at bedtime. (Patient not taking: Reported on 11/16/2023) 30 tablet 1    timolol (TIMOPTIC) 0.5 % ophthalmic solution Place 1 drop into both eyes daily after breakfast. (Patient not taking: Reported on 11/16/2023) 5 mL 1     Musculoskeletal: Strength & Muscle Tone: within normal limits Gait & Station: normal Patient leans: N/A  Psychiatric Specialty Exam:  Presentation  General Appearance:  Appropriate for Environment  Eye Contact: Fair  Speech: Normal Rate (Patient slurs words at baseline)  Speech Volume: Normal  Handedness: Right   Mood and Affect  Mood: Euthymic; Anxious  Affect: Congruent (Irritable at times)   Thought Process  Thought Processes: Coherent; Linear  Descriptions of Associations: Intact  Orientation: Full (Time, Place and Person)  Thought Content: Logical  History of  Schizophrenia/Schizoaffective disorder: No  Duration of Psychotic Symptoms:N/A Hallucinations: Hallucinations: None  Ideas of Reference: None  Suicidal Thoughts: Suicidal Thoughts: No SI Passive Intent and/or Plan: Without Plan; Without Intent  Homicidal Thoughts: Homicidal Thoughts: No   Sensorium  Memory: Immediate Good; Recent Good; Remote Good  Judgment: Poor  Insight: Poor   Executive Functions  Concentration: Good  Attention Span: Fair  Recall: Good  Fund of Knowledge: Good  Language: Good   Psychomotor Activity  Psychomotor Activity: Psychomotor Activity: Increased (stereotypie of whipping face throughout  interview)   Assets  Assets: Desire for Improvement; Housing   Sleep  Sleep: Sleep: Poor (Secondary to being at [facility] observation unit and coming to the hospital early this morning) Number of Hours of Sleep: 5    Physical Exam: Physical Exam Vitals and nursing note reviewed.  HENT:     Head: Normocephalic and atraumatic.  Pulmonary:     Effort: Pulmonary effort is normal.  Skin:    Comments: 2 cm raised nonerythematous lesion on posterior neck that feels cystic on palpitation but without fluctuation or tenderness.  Neurological:     General: No focal deficit present.     Mental Status: He is alert.    Review of Systems  Constitutional:  Negative for fever.  Respiratory:  Negative for shortness of breath.   Cardiovascular:  Negative for chest pain and palpitations.  Gastrointestinal:  Negative for constipation, diarrhea, nausea and vomiting.  Genitourinary:        Positive for urinary urgency on furosemide.  Neurological:  Negative for dizziness, weakness and headaches.  Psychiatric/Behavioral:  Negative for hallucinations and suicidal ideas.    Blood pressure (!) 140/94, pulse 76, temperature 98.6 F (37 C), temperature source Oral, resp. rate 18, height 5\' 9"  (1.753 m), weight 71.2 kg, SpO2 100%. Body mass index is 23.18  kg/m.   Treatment Plan Summary: Daily contact with patient to assess and evaluate symptoms and progress in treatment and Medication management   ASSESSMENT & PLAN  ASSESSMENT:   Diagnoses / Active Problems:  Principal Problem:   Panic disorder Active Problems:   Nicotine use disorder   GAD (generalized anxiety disorder)   Cocaine use disorder, severe, dependence (HCC)   Matthew Hurrell. is a 58 y.o. male with past history of MDD, GAD, cocaine use disorder, psychiatric hospitalizations in 2022 and 2023 for SI who presents with passive suicidal thoughts and worsening anxiety.  Patient presented with passive suicidal thoughts which do not appear to be related to depression currently as he is denying depressive symptoms including depressed mood and anhedonia.  Suicidal thoughts likely manifestation of panic attacks and worsening psychosocial functioning.  Regardless, on interview in the hospital, he is denying suicidal thoughts now. Patient does endorse experiences and symptomatology consistent with trauma/stress-related disorder and would likely benefit from therapy around this but was not given this diagnosis as the overall biopsychosocial narrative did not seem consistent with PTSD.  For panic disorder and anxiety, we will plan to split gabapentin dose from 300 once at night to 100 three times daily to better treat anxiety.  We will also add hydroxyzine for acute anxiety but only low-dose to minimize anticholinergic burden given history of stroke predisposing patient to dementia.  Additionally we considered increasing sertraline or adjuncting with buspirone and patient opted for the increase in sertraline which we will hold to increase until tomorrow given we are adjusting gabapentin today.  Patient would also benefit from therapist.  Will inquire if patient wants to reconsider rehab or similar intervention for cocaine cessation.  Provided nicotine patch for nicotine cessation.    PLAN: Safety and Monitoring:             -- Voluntary admission to inpatient psychiatric unit for safety, stabilization and treatment             -- Daily contact with patient to assess and evaluate symptoms and progress in treatment             -- Patient's case to be discussed in  multi-disciplinary team meeting             -- Observation Level : q15 minute checks             -- Vital signs:  q12 hours             -- Precautions: suicide, elopement, and assault   2. Psychiatric Diagnoses and Treatment:  -- Continue sertraline 100 mg once daily for panic disorder, GAD and increase to 125 once daily starting tomorrow morning -- Switch gabapentin from 300 mg once at night to 100 mg 3 times daily for anxiety, neuropathic pain  -- Start hydroxyzine 10 mg 3 times daily as needed for anxiety  --  The risks/benefits/side-effects/alternatives to this medication were discussed in detail with the patient and time was given for questions. The patient consents to medication trial.              -- Metabolic profile and EKG monitoring obtained while on an atypical antipsychotic. See #4 below for values.              -- Encouraged patient to participate in unit milieu and in scheduled group therapies              -- Short Term Goals: Ability to identify changes in lifestyle to reduce recurrence of condition will improve, Ability to verbalize feelings will improve, Ability to disclose and discuss suicidal ideas, Ability to demonstrate self-control will improve, Ability to identify and develop effective coping behaviors will improve, Ability to maintain clinical measurements within normal limits will improve, Compliance with prescribed medications will improve, and Ability to identify triggers associated with substance abuse/mental health issues will improve             -- Long Term Goals: Improvement in symptoms so as ready for discharge                3. Medical Issues Being Addressed:              -- Heart  failure with reduced ejection fraction: Restart Lasix 40 once daily, BiDil 2 tablets 3 times daily, losartan 100 mg once daily, spironolactone 25 mg once daily  -- Type 2 diabetes mellitus: Restart metformin 24-hour XR 500 mg once daily  -- Dyslipidemia: Restart atorvastatin 80 mg once daily, Zetia 10 mg once daily  -- History of stroke: Restart aspirin EC 81 mg once daily  -- Nicotine use disorder: Start nicotine patch 14 mg once daily as needed    -- Continue PRN's: Tylenol, Maalox, Milk of Magnesia   4. Routine and other pertinent labs reviewed: EKG monitoring: QTc: 453 Ethanol level: Negative UDS: Positive for cocaine CMP: Unremarkable except creatinine 1.43 (baseline) CBC: Unremarkable  Metabolism / endocrine: BMI: Body mass index is 23.18 kg/m. Prolactin: Lab Results  Component Value Date   PROLACTIN 4.8 02/14/2022   Lipid Panel: Lab Results  Component Value Date   CHOL 167 07/04/2023   TRIG 116 07/04/2023   HDL 41 07/04/2023   CHOLHDL 4.1 07/04/2023   VLDL 23 07/04/2023   LDLCALC 103 (H) 07/04/2023   LDLCALC 16 07/19/2022   HbgA1c: HbA1c, POC (controlled diabetic range) (%)  Date Value  11/04/2023 6.1   Hgb A1c MFr Bld (%)  Date Value  11/17/2023 6.2 (H)   TSH: TSH (uIU/mL)  Date Value  01/13/2024 0.772  03/02/2014 1.478    Labs to order: None  5. Discharge Planning:              --  Social work and case management to assist with discharge planning and identification of hospital follow-up needs prior to discharge             -- Estimated LOS: 3-5 days             -- Discharge Concerns: Need to establish a safety plan; Medication compliance and effectiveness             -- Discharge Goals: Return home with outpatient referrals for mental health follow-up including medication management/psychotherapy    I certify that inpatient services furnished can reasonably be expected to improve the patient's condition.   This note was created using a voice  recognition software as a result there may be grammatical errors inadvertently enclosed that do not reflect the nature of this encounter. Every attempt is made to correct such errors.   Meryl Dare, MD PGY-1 Psychiatry Resident 01/14/2024, 3:36 PM

## 2024-01-14 NOTE — Group Note (Signed)
Recreation Therapy Group Note   Group Topic:Animal Assisted Therapy   Group Date: 01/14/2024 Start Time: 0944 End Time: 1030 Facilitators: Rye Dorado-McCall, LRT,CTRS Location: [address] Hall Dayroom   Animal-Assisted Activity (AAA) Program Checklist/Progress Notes Patient Eligibility Criteria Checklist & Daily Group note for Rec Tx Intervention  AAA/T Program Assumption of Risk Form signed by Patient/ or Parent Legal Guardian Yes  Patient understands his/her participation is voluntary Yes  Education: Charity fundraiser, Appropriate Animal Interaction   Education Outcome: Acknowledges education.    Affect/Mood: N/A   Participation Level: Did not attend    Clinical Observations/Individualized Feedback:      Plan: Continue to engage patient in RT group sessions 2-3x/week.   Kirstin Kugler-McCall, LRT,CTRS 01/14/2024 1:50 PM

## 2024-01-14 NOTE — BHH Counselor (Signed)
Adult Comprehensive Assessment  Patient ID: Matthew [name]., male   DOB: 1966/02/06, 58 y.o.   MRN: [id number]  Information Source: Information source: Patient  Current Stressors:  Patient states their primary concerns and needs for treatment are:: Needing to get back on prescribed medications Patient states their goals for this hospitilization and ongoing recovery are:: "I need to get back on track" Educational / Learning stressors: n/a Employment / Job issues: n/a Family Relationships: n/a Surveyor, quantity / Lack of resources (include bankruptcy): n/a Housing / Lack of housing: n/a Physical health (include injuries & life threatening diseases): n/a Social relationships: n/a Substance abuse: recent cocaine use Bereavement / Loss: grieving loss of wife who died in February 15, 2020  Living/Environment/Situation:  Living Arrangements: Alone How long has patient lived in current situation?: UTA  Family History:  Marital status: Widowed Widowed, when?: 2020/02/15 Are you sexually active?: No What is your sexual orientation?: Heterosexual Has your sexual activity been affected by drugs, alcohol, medication, or emotional stress?: No Does patient have children?: Yes How many children?: 2 How is patient's relationship with their children?: Limited contact with adult children  Childhood History:  By whom was/is the patient raised?: Mother Additional childhood history information: UTA Description of patient's relationship with caregiver when they were a child: reports good relationship with mother How were you disciplined when you got in trouble as a child/adolescent?: UTA Did patient suffer any verbal/emotional/physical/sexual abuse as a child?: No Did patient suffer from severe childhood neglect?: No Has patient ever been sexually abused/assaulted/raped as an adolescent or adult?: No Was the patient ever a victim of a crime or a disaster?: No Witnessed domestic violence?: No Has patient been affected by  domestic violence as an adult?: No  Education:  Highest grade of school patient has completed: UTA Currently a Consulting civil engineer?: No  Employment/Work Situation:   Employment Situation: Unemployed Patient's Job has Been Impacted by Current Illness: Yes Describe how Patient's Job has Been Impacted: 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?: No  Financial Resources:   Does patient have a Lawyer or guardian?: No  Alcohol/Substance Abuse:   What has been your use of drugs/alcohol within the last 12 months?: Reports sporadic cocaine use Alcohol/Substance Abuse Treatment Hx: Denies past history  Social Support System:   Forensic psychologist System: Fair Museum/gallery exhibitions officer System: UTA - somewhat guarded during assessment  Leisure/Recreation:   Do You Have Hobbies?: No  Strengths/Needs:   What is the patient's perception of their strengths?: UTA Patient states they can use these personal strengths during their treatment to contribute to their recovery: UTA Patient states these barriers may affect/interfere with their treatment: Denies Patient states these barriers may affect their return to the community: reported some concerns about being far from home and requiring transportation at discharge  Discharge Plan:   Currently receiving community mental health services: No Patient states concerns and preferences for aftercare planning are: reports interest in services with [facility] Patient states they will know when they are safe and ready for discharge when: back on medications Does patient have access to transportation?: Yes Does patient have financial barriers related to discharge medications?: No Patient description of barriers related to discharge medications: n/a Will patient be returning to same living situation after discharge?: Yes  Summary/Recommendations:   Summary and  Recommendations (to be completed by the evaluator): Matthew [name]. is a 58 year old AA male who presents with  c/o of panic attacks. He also admits to recent cocaine use and medication non-compliance for the last one to two weeks. Matthew [name] lives home alone and reports coping with loss of spouse in 2021, contributing to depressed mood. Matthew [name] stated that his primary goal is to "get back on track" refrencing his recent medication non-compliance. He is open to referrals for mental health treatment, citing interest in [facility] and [employer].While here Matthew [name] can benefit from crisis stabilization, medication management, therapeutic milieu, and referrals for services.   Jacinta Shoe, LCSW. 01/14/2024

## 2024-01-14 NOTE — Progress Notes (Signed)
  ADMISSION DAR NOTE:   Pt presented under voluntary status from [facility]. Alert and oriented by 3. Pt observed with anxious affect, logical speech and fair eye contact.  Reports worsening depression and anxiety with Passive SI and cocaine use related to the death of his wife in 2020-02-16 and a lot of health issues being blind in right eye, hx of stroke, HTN, DM and CHF. He denies drinking alcohol or any other drugs.  Patients report being sad, having anxiety attacks, poor sleep for the last 2 days and impulsivity. History of AH, denies SI/HI/VH and verbally contracts for safety.  Patient states he has not been compliant with his medications he is followed by [facility].   Emotional support and availability offered to Patient as needed. Skin assessment done and belongings searched per protocol. Items deemed contraband secured in locker. Unit orientation and routine discussed, Care Plan reviewed as well and Patient verbalized understanding. Fluids and Food offered, tolerated well. Q15 minutes safety checks initiated without self harm gestures.

## 2024-01-14 NOTE — BHH Group Notes (Signed)
BHH Group Notes:  (Nursing/MHT/Case Management/Adjunct)  Date:  01/14/2024  Time:  2000  Type of Therapy:   wrap up group  Participation Level:  Active  Participation Quality:  Appropriate and Attentive  Affect:  Appropriate  Cognitive:  Alert and Appropriate  Insight:  Appropriate  Engagement in Group:  Engaged  Modes of Intervention:  Discussion  Summary of Progress/Problems:  Fay Records 01/14/2024, 9:46 PM
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
## 2024-01-14 NOTE — Tx Team (Signed)
Initial Treatment Plan 01/14/2024 5:59 AM Matthew [name]. ZOX:[id number]    PATIENT STRESSORS: Health problems   Loss of wife   Medication change or noncompliance   Substance abuse     PATIENT STRENGTHS: Active sense of humor  Capable of independent living  Personnel officer means    PATIENT IDENTIFIED PROBLEMS: Substance use  Depression, anxiety, suicide ideation  "Be back on medications"  "Help with grief process"               DISCHARGE CRITERIA:  Improved stabilization in mood, thinking, and/or behavior Motivation to continue treatment in a less acute level of care Reduction of life-threatening or endangering symptoms to within safe limits Verbal commitment to aftercare and medication compliance  PRELIMINARY DISCHARGE PLAN: Attend aftercare/continuing care group Attend PHP/IOP Outpatient therapy Return to previous living arrangement  PATIENT/FAMILY INVOLVEMENT: This treatment plan has been presented to and reviewed with the patient, Matthew [name]., and/or family member.  The patient and family have been given the opportunity to ask questions and make suggestions.  Bethann Punches, RN 01/14/2024, 5:59 AM

## 2024-01-14 NOTE — Progress Notes (Signed)
Inpatient Behavioral Health Placement  Addendum: Pt is under review by [facility].   Per Lavonna Monarch pt meets inpatient behavioral health placement. This CSW requested [facility] Oluwatosin Olasunkanmi,RN to review. First shift CSW to follow up.      Maryjean Ka, MSW, LCSWA 01/14/2024 2:18 AM

## 2024-01-14 NOTE — Group Note (Signed)
LCSW Group Therapy Note   Group Date: 01/14/2024 Start Time: 1100 End Time: 1200   Type of Therapy:  Group Therapy  Topic:  Finding Balance: Using Wise Mind for Thoughtful Decisions  Participation Level:  did not attend  Objective:  the objective of this class is to help participants understand the concept of Wise Mind and learn how to apply it to real-life situations to make balanced, thoughtful decisions. Participants will gain tools to manage emotions, consider logic, and find a middle ground that leads to healthier responses and outcomes.  Goals: Understand the concept of Wise Mind.  Participants will learn the difference between Emotional Mind, Reasonable Mind, and West[place] Settle Mind, and how [place] Settle Mind helps in balancing emotions and logic to make thoughtful decisions. Recognize when you're in Emotional Mind or Reasonable Mind.  Participants will identify the signs of Emotional Mind and Reasonable Mind in their own reactions to situations and understand how to move into Wise Mind for more balanced responses. Practice applying [place] Settle Mind to real-life situations.  Through scenarios and group activities, participants will practice using Wise Mind in everyday situations, learning how to acknowledge their emotions, think logically, and create solutions that are thoughtful and balanced.  Summary: In this class, we explored the concept of Wise Mind--the balance between Emotional Mind and Reasonable Mind. We discussed how Emotional Mind can sometimes lead to impulsive, reactive decisions driven by intense feelings, and how Reasonable Mind might ignore feelings altogether, focusing only on facts and logic. [place] Settle Mind is the middle ground that combines both, allowing you to consider your emotions and use logic to make balanced, thoughtful decisions.  We learned how to recognize when we're in Emotional Mind or Reasonable Mind, and practiced using Wise Mind in real-life situations. By using Matthew [name], we can  improve how we handle challenging situations, make better decisions, and strengthen our relationships with others.   Matthew [name] Rhonna Holster, LCSWA 01/14/2024  1:22 PM

## 2024-01-14 NOTE — Plan of Care (Signed)
  Problem: Education: Goal: Knowledge of [facility] Education information/materials will improve Outcome: Progressing Goal: Mental status will improve Outcome: Progressing   Problem: Activity: Goal: Interest or engagement in activities will improve Outcome: Progressing Goal: Sleeping patterns will improve Outcome: Progressing   Problem: Coping: Goal: Ability to verbalize frustrations and anger appropriately will improve Outcome: Progressing   Problem: Health Behavior/Discharge Planning: Goal: Identification of resources available to assist in meeting health care needs will improve Outcome: Progressing

## 2024-01-14 NOTE — Progress Notes (Signed)
Pt is alert and oriented x 4.  Visible in milieu. Bright affect and engages with peers and staff appropriately.    Pt is complaining of a bug bite on the back of his neck. he has a dime sized raised area.  No further distress noted.

## 2024-01-14 NOTE — ED Notes (Signed)
Patient is lying in bed in no acute distress, respirations are even and unlabored, will continue to monitor patient for safety.

## 2024-01-14 NOTE — Group Note (Signed)
Date:  01/14/2024 Time:  4:41 PM  Group Topic/Focus:  Goals Group:   The focus of this group is to help patients establish daily goals to achieve during treatment and discuss how the patient can incorporate goal setting into their daily lives to aide in recovery. Orientation:   The focus of this group is to educate the patient on the purpose and policies of crisis stabilization and provide a format to answer questions about their admission.  The group details unit policies and expectations of patients while admitted.    Participation Level:  Active  Participation Quality:  Appropriate  Affect:  Appropriate  Cognitive:  Appropriate  Insight: Appropriate  Engagement in Group:  Engaged  Modes of Intervention:  Discussion, Orientation, and Rapport Building  Additional Comments:   Pt attended and participated in the Orientation and Goals group. Pt was attentive and contributed to the discussion throughout the group. Pt did not share a personal goal.  Stark Bray 01/14/2024, 4:41 PM

## 2024-01-14 NOTE — ED Notes (Signed)
Report has been called to [facility]. Safe transport notified of need to transport. Awaiting transport.

## 2024-01-15 ENCOUNTER — Encounter (HOSPITAL_COMMUNITY): Payer: Self-pay

## 2024-01-15 DIAGNOSIS — F410 Panic disorder [episodic paroxysmal anxiety] without agoraphobia: Secondary | ICD-10-CM | POA: Diagnosis not present

## 2024-01-15 MED ORDER — SERTRALINE HCL 50 MG PO TABS
150.0000 mg | ORAL_TABLET | Freq: Every day | ORAL | Status: DC
  Administered 2024-01-16: 150 mg via ORAL
  Filled 2024-01-15 (×3): qty 3

## 2024-01-15 MED ORDER — LOSARTAN POTASSIUM 50 MG PO TABS
100.0000 mg | ORAL_TABLET | Freq: Every day | ORAL | Status: DC
  Administered 2024-01-15 – 2024-01-17 (×3): 100 mg via ORAL
  Filled 2024-01-15 (×6): qty 2

## 2024-01-15 MED ORDER — ISOSORB DINITRATE-HYDRALAZINE 20-37.5 MG PO TABS
2.0000 | ORAL_TABLET | Freq: Three times a day (TID) | ORAL | Status: DC
  Administered 2024-01-15 – 2024-01-17 (×7): 2 via ORAL
  Filled 2024-01-15 (×14): qty 2

## 2024-01-15 MED ORDER — CLONIDINE HCL 0.1 MG PO TABS
0.1000 mg | ORAL_TABLET | ORAL | Status: DC | PRN
Start: 2024-01-15 — End: 2024-01-16
  Administered 2024-01-15 – 2024-01-16 (×2): 0.1 mg via ORAL
  Filled 2024-01-15 (×2): qty 1

## 2024-01-15 NOTE — Plan of Care (Signed)

## 2024-01-15 NOTE — Group Note (Signed)
Date:  01/15/2024 Time:  4:22 PM  Group Topic/Focus:  Dimensions of Wellness:   The focus of this group is to introduce the topic of wellness and discuss the role each dimension of wellness plays in total health.    Participation Level:  Active  Participation Quality:  Appropriate  Affect:  Appropriate  Cognitive:  Appropriate  Insight: Appropriate  Engagement in Group:  Engaged  Modes of Intervention:  Activity and Education  Additional Comments:   Pt attended and participated in the Physical Wellness group. Pt was attentive as MHT provided education on Mindful Progressive Relaxation. Pt practiced the relaxation technique with the group.  Edmund Hilda Rena Hunke 01/15/2024, 4:22 PM

## 2024-01-15 NOTE — [facility] Suicide Risk Assessment (Signed)
[facility] INPATIENT:  Family/Significant Other Suicide Prevention Education  Suicide Prevention Education:  Patient Refusal for Family/Significant Other Suicide Prevention Education: The patient Matthew [name]. has refused to provide written consent for family/significant other to be provided Family/Significant Other Suicide Prevention Education during admission and/or prior to discharge.  Physician notified.  Jacinta Shoe, LCSW 01/15/2024, 4:39 PM

## 2024-01-15 NOTE — Plan of Care (Signed)
  Problem: Education: Goal: Knowledge of [facility] Education information/materials will improve Outcome: Progressing Goal: Emotional status will improve Outcome: Progressing Goal: Mental status will improve Outcome: Progressing Goal: Verbalization of understanding the information provided will improve Outcome: Progressing   Problem: Activity: Goal: Interest or engagement in activities will improve Outcome: Progressing Goal: Sleeping patterns will improve Outcome: Progressing   Problem: Coping: Goal: Ability to demonstrate self-control will improve Outcome: Progressing   Problem: Safety: Goal: Periods of time without injury will increase Outcome: Progressing

## 2024-01-15 NOTE — Group Note (Signed)
Date:  01/15/2024 Time:  11:12 AM  Group Topic/Focus:  Goals Group:   The focus of this group is to help patients establish daily goals to achieve during treatment and discuss how the patient can incorporate goal setting into their daily lives to aide in recovery. Orientation:   The focus of this group is to educate the patient on the purpose and policies of crisis stabilization and provide a format to answer questions about their admission.  The group details unit policies and expectations of patients while admitted.    Participation Level:  Active  Participation Quality:  Appropriate  Affect:  Appropriate  Cognitive:  Appropriate  Insight: Appropriate  Engagement in Group:  Engaged  Modes of Intervention:  Activity, Orientation, and Rapport Building  Additional Comments:   Pt attended and actively participated in the Orientation and Goals group. Pt shared personal insight and experiences with reaching personal  Pt completed the SMART goals activity in which he identified attending all scheduled groups as his goal.   Matthew [name] 01/15/2024, 11:12 AM

## 2024-01-15 NOTE — BH IP Treatment Plan (Signed)
Interdisciplinary Treatment and Diagnostic Plan Update  01/15/2024 Time of Session: 1035 Matthew [name]. MRN: [id number]  Principal Diagnosis: Panic disorder  Secondary Diagnoses: Principal Problem:   Panic disorder Active Problems:   Nicotine use disorder   GAD (generalized anxiety disorder)   Cocaine use disorder, severe, dependence (HCC)   Current Medications:  Current Facility-Administered Medications  Medication Dose Route Frequency Provider Last Rate Last Admin   acetaminophen (TYLENOL) tablet 650 mg  650 mg Oral Q6H PRN Sindy Guadeloupe, NP       aspirin EC tablet 81 mg  81 mg Oral Daily Meryl Dare, MD   81 mg at 01/15/24 0818   atorvastatin (LIPITOR) tablet 80 mg  80 mg Oral Daily Meryl Dare, MD   80 mg at 01/15/24 6213   cloNIDine (CATAPRES) tablet 0.1 mg  0.1 mg Oral Q4H PRN Massengill, Harrold Donath, MD       ezetimibe (ZETIA) tablet 10 mg  10 mg Oral Daily Meryl Dare, MD   10 mg at 01/15/24 0817   furosemide (LASIX) tablet 40 mg  40 mg Oral Daily Meryl Dare, MD   40 mg at 01/15/24 0817   gabapentin (NEURONTIN) capsule 100 mg  100 mg Oral Q8H Massengill, Harrold Donath, MD   100 mg at 01/15/24 1316   hydrocortisone cream 1 %   Topical TID Meryl Dare, MD   Given at 01/15/24 1300   hydrOXYzine (ATARAX) tablet 10 mg  10 mg Oral TID PRN Meryl Dare, MD   10 mg at 01/14/24 2139   isosorbide-hydrALAZINE (BIDIL) 20-37.5 MG per tablet 2 tablet  2 tablet Oral TID Onuoha, Chinwendu V, NP   2 tablet at 01/15/24 1300   losartan (COZAAR) tablet 100 mg  100 mg Oral Daily Onuoha, Chinwendu V, NP   100 mg at 01/15/24 0644   metFORMIN (GLUCOPHAGE-XR) 24 hr tablet 500 mg  500 mg Oral Q breakfast Meryl Dare, MD   500 mg at 01/15/24 0818   nicotine (NICODERM CQ - dosed in mg/24 hours) patch 14 mg  14 mg Transdermal Daily PRN Meryl Dare, MD       OLANZapine (ZYPREXA) injection 10 mg  10 mg Intramuscular TID PRN Sindy Guadeloupe, NP       OLANZapine (ZYPREXA) injection 5 mg  5 mg  Intramuscular TID PRN Sindy Guadeloupe, NP       OLANZapine zydis (ZYPREXA) disintegrating tablet 5 mg  5 mg Oral TID PRN Sindy Guadeloupe, NP       Melene Muller ON 01/16/2024] sertraline (ZOLOFT) tablet 150 mg  150 mg Oral Daily Meryl Dare, MD       spironolactone (ALDACTONE) tablet 25 mg  25 mg Oral Daily Meryl Dare, MD   25 mg at 01/15/24 0818   PTA Medications: Medications Prior to Admission  Medication Sig Dispense Refill Last Dose/Taking   aspirin EC 81 MG tablet Take 1 tablet (81 mg total) by mouth daily. Swallow whole. 30 tablet 1 Taking   atorvastatin (LIPITOR) 80 MG tablet Take 1 tablet (80 mg total) by mouth daily. 90 tablet 1 Taking   BIDIL 20-37.5 MG tablet Take 2 tablets by mouth 3 (three) times daily. 180 tablet 2 Taking   ezetimibe (ZETIA) 10 MG tablet Take 1 tablet (10 mg total) by mouth daily. 90 tablet 0 Taking   furosemide (LASIX) 40 MG tablet Take 1 tablet (40 mg total) by mouth daily. 90 tablet 3 Taking   gabapentin (NEURONTIN) 300 MG capsule Take 1 capsule (300 mg total) by  mouth at bedtime. 30 capsule 3 Taking   losartan (COZAAR) 100 MG tablet Take 1 tablet (100 mg total) by mouth daily. 90 tablet 0 Taking   metFORMIN (GLUCOPHAGE-XR) 500 MG 24 hr tablet Take 1 tablet (500 mg total) by mouth daily with breakfast. 90 tablet 1 Taking   sertraline (ZOLOFT) 100 MG tablet Take 1 tablet (100 mg total) by mouth daily. 90 tablet 1 Taking   spironolactone (ALDACTONE) 25 MG tablet Take 1 tablet (25 mg total) by mouth daily. 90 tablet 1 Taking   melatonin 5 MG TABS Take 1 tablet (5 mg total) by mouth at bedtime. (Patient not taking: Reported on 11/16/2023) 30 tablet 1    timolol (TIMOPTIC) 0.5 % ophthalmic solution Place 1 drop into both eyes daily after breakfast. (Patient not taking: Reported on 11/16/2023) 5 mL 1     Patient Stressors: Health problems   Loss of wife   Medication change or noncompliance   Substance abuse    Patient Strengths: Active sense of humor  Capable of  independent living  Personnel officer means   Treatment Modalities: Medication Management, Group therapy, Case management,  1 to 1 session with clinician, Psychoeducation, Recreational therapy.   Physician Treatment Plan for Primary Diagnosis: Panic disorder Long Term Goal(s):     Short Term Goals:    Medication Management: Evaluate patient's response, side effects, and tolerance of medication regimen.  Therapeutic Interventions: 1 to 1 sessions, Unit Group sessions and Medication administration.  Evaluation of Outcomes: Not Progressing  Physician Treatment Plan for Secondary Diagnosis: Principal Problem:   Panic disorder Active Problems:   Nicotine use disorder   GAD (generalized anxiety disorder)   Cocaine use disorder, severe, dependence (HCC)  Long Term Goal(s):     Short Term Goals:       Medication Management: Evaluate patient's response, side effects, and tolerance of medication regimen.  Therapeutic Interventions: 1 to 1 sessions, Unit Group sessions and Medication administration.  Evaluation of Outcomes: Not Progressing   RN Treatment Plan for Primary Diagnosis: Panic disorder Long Term Goal(s): Knowledge of disease and therapeutic regimen to maintain health will improve  Short Term Goals: Ability to verbalize frustration and anger appropriately will improve, Ability to demonstrate self-control, Ability to participate in decision making will improve, Ability to verbalize feelings will improve, Ability to identify and develop effective coping behaviors will improve, and Compliance with prescribed medications will improve  Medication Management: RN will administer medications as ordered by provider, will assess and evaluate patient's response and provide education to patient for prescribed medication. RN will report any adverse and/or side effects to prescribing provider.  Therapeutic Interventions: 1 on 1 counseling sessions, Psychoeducation,  Medication administration, Evaluate responses to treatment, Monitor vital signs and CBGs as ordered, Perform/monitor CIWA, COWS, AIMS and Fall Risk screenings as ordered, Perform wound care treatments as ordered.  Evaluation of Outcomes: Not Progressing   LCSW Treatment Plan for Primary Diagnosis: Panic disorder Long Term Goal(s): Safe transition to appropriate next level of care at discharge, Engage patient in therapeutic group addressing interpersonal concerns.  Short Term Goals: Engage patient in aftercare planning with referrals and resources, Increase social support, Increase ability to appropriately verbalize feelings, Increase emotional regulation, Identify triggers associated with mental health/substance abuse issues, and Increase skills for wellness and recovery  Therapeutic Interventions: Assess for all discharge needs, 1 to 1 time with Social worker, Explore available resources and support systems, Assess for adequacy in community support network, Educate family and significant other(s)  on suicide prevention, Complete Psychosocial Assessment, Interpersonal group therapy.  Evaluation of Outcomes: Not Progressing   Progress in Treatment: Attending groups: Yes. Participating in groups: Yes. Taking medication as prescribed: Yes. Toleration medication: Yes. Family/Significant other contact made: No, will contact:  Pt declined consents; is widowed with limited social supports Patient understands diagnosis: Yes. Discussing patient identified problems/goals with staff: Yes. Medical problems stabilized or resolved: Yes. Denies suicidal/homicidal ideation: Yes. Issues/concerns per patient self-inventory: No. Other: None  New problem(s) identified: No, Describe:  None  New Short Term/Long Term Goal(s): medication stabilization, elimination of SI thoughts, development of comprehensive mental wellness plan.   Patient Goals: "Get back on track and take my medications the way I'm supposed  to"  Discharge Plan or Barriers: Patient recently admitted. CSW will continue to follow and assess for appropriate referrals and possible discharge planning.   Reason for Continuation of Hospitalization: Depression Medication stabilization  Estimated Length of Stay: 2-3 Days  Last 3 [place] Suicide Severity Risk Score: Flowsheet Row [facility] (Current) [facility] from 01/13/2024 in [facility] to Hosp-Admission (Discharged) from 11/16/2023 in [facility]  C-SSRS RISK CATEGORY Low Risk Low Risk No Risk       Last PHQ 2/9 Scores:    11/04/2023    2:55 PM 08/13/2022    2:27 PM 04/22/2022    1:54 PM  Depression screen PHQ 2/9  Decreased Interest 1 3 2   Down, Depressed, Hopeless 2 3 3   PHQ - 2 Score 3 6 5   Altered sleeping 3 1 0  Tired, decreased energy 1 3 3   Change in appetite 1 2 0  Feeling bad or failure about yourself  1 3 2   Trouble concentrating 1 1 1   Moving slowly or fidgety/restless 2 1 1   Suicidal thoughts 0 1 1  PHQ-9 Score 12 18 13   Difficult doing work/chores   Somewhat difficult    Scribe for Treatment Team: Jacinta Shoe, LCSW 01/15/2024 2:42 PM

## 2024-01-15 NOTE — Progress Notes (Signed)
   01/15/24 0556  15 Minute Checks  Location Bedroom  Visual Appearance Calm  Behavior Sleeping  Sleep (Behavioral Health Patients Only)  Calculate sleep? (Click Yes once per 24 hr at 0600 safety check) Yes  Documented sleep last 24 hours 6.75

## 2024-01-15 NOTE — Group Note (Signed)
Date:  01/15/2024 Time:  9:51 PM  Group Topic/Focus:  Narcotics Anonymous (NA) Meeting    Participation Level:  Active  Participation Quality:  Appropriate  Affect:  Appropriate  Cognitive:  Appropriate  Insight: Appropriate  Engagement in Group:  Engaged  Modes of Intervention:  Socialization and Support  Additional Comments:  Patient attended NA meeting  Kennieth Francois 01/15/2024, 9:51 PM

## 2024-01-15 NOTE — Progress Notes (Signed)
   01/14/24 2140  Psych Admission Type (Psych Patients Only)  Admission Status Voluntary  Psychosocial Assessment  Patient Complaints Anxiety  Eye Contact Fair  Facial Expression Anxious  Affect Appropriate to circumstance  Speech Logical/coherent  Interaction Assertive  Motor Activity Slow  Appearance/Hygiene Unremarkable  Behavior Characteristics Cooperative  Mood Depressed;Anxious  Thought Process  Coherency WDL  Content WDL  Delusions None reported or observed  Perception WDL  Hallucination None reported or observed  Judgment Impaired  Confusion None  Danger to Self  Current suicidal ideation? Denies  Agreement Not to Harm Self Yes  Description of Agreement verbal  Danger to Others  Danger to Others None reported or observed

## 2024-01-15 NOTE — Progress Notes (Signed)
Matthew [name] presents in animated mood and denies SI,HI, and A/V/H with no plan or intent.  Patient states he slept very well last night better than prior nights. Patient rates his depression at 4 out of 10 and anxiety a 3 out of 10. Patient has been med compliant and socializing appropriately in milieu. Patient's goal for today is to "have positive thoughts and work on concentrating on what I need to do." Patient denies any pain or discomfort and remains cooperative in unit.

## 2024-01-15 NOTE — Care Management Important Message (Signed)
Medicare IM printed and given to CSW to give to the patient.

## 2024-01-15 NOTE — Progress Notes (Signed)
[facility] MD Progress Note  01/15/2024 8:50 AM Matthew [name].  MRN:  [id number]  Principal Problem: Panic disorder Diagnosis: Principal Problem:   Panic disorder Active Problems:   Nicotine use disorder   GAD (generalized anxiety disorder)   Cocaine use disorder, severe, dependence (HCC)   Reason for Admission:  Matthew Sandiford. is a 58 y.o. male  with a documented past psychiatric history of MDD, GAD, stimulant use disorder (cocaine), hospitalizations in 2022 and 2023 for SI. Patient initially arrived to [facility] on 01/13/24 for panic attacks, and admitted to [facility] on 01/14/24 for stabilization of acute on chronic psychiatric conditions. PMHx is significant for HFrEF, HTN, T2DM, prior CVA, and CKD 3a.  (admitted on 01/14/2024, total  LOS: 1 day )   Yesterday, the psychiatry team made following recommendations:  -- Continue sertraline 100 mg once daily for panic disorder, GAD and increase to 125 once daily starting tomorrow morning -- Switch gabapentin from 300 mg once at night to 100 mg 3 times daily for anxiety, neuropathic pain             -- Start hydroxyzine 10 mg 3 times daily as needed for anxiety   Pertinent information discussed during interdisciplinary rounds: No concerns for nursing except for elevated blood pressures.  Chart review last 24 hours: Pulse normal, blood pressure has been elevated this morning at 178/110 before meds, 147/91 after meds, 159/90 again are later.  Information Obtained Today During Patient Interview:  Patient reports that his mood is "good mood."  He reports he has been thinking of good thoughts.  Reports that he slept very good last night.  Reports that he was drowsy yesterday we discussed this may have been because he got his sertraline in the afternoon instead the morning but discussed that if he has continued drowsiness that this may be related to the gabapentin changes.  He reports that the drowsiness was not necessarily bad and that it  helps him feel somewhat calm.  Reports that his appetite is fine.  Discussed doing SI IOP at [facility] and he was agreeable but had some uncertainty about whether he would be able to go to all the appointments, so we discussed that we will defer talking with social work about setting up for now but will discuss this tomorrow.    Past Psychiatric History:  Current Psychiatrist: None, PCP prescribes sertraline Current Therapist: None Previous Psychiatric Diagnoses: MDD, GAD, stimulant use disorder (cocaine) Psychiatric Medications: Current Sertraline 100 mg once daily for MDD Past Trazodone, reasonably discontinued in past due to patient's heart failure Prozac, lack of efficacy per chart Seroquel 200, "slept good," lack of efficacy for chart  Psychiatric Hospitalization hx: 2022 and 2023 for SI Psychotherapy hx: No history Neuromodulation history: Denies History of suicide: Denies History of homicide or aggression: Denies  Family History: Medical: Cervical cancer in mother, heart attack in father at 22 Psychiatric Dx: Denies Suicide Hx: Denies Violence/Aggression: Denies Substance use: Denies  Social History:  Developmental: Did not describe except that both parents are deceased currently.  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 02/2014 Echo: EF 25-30%.   Blindness of right eye     Cardiomyopathy (HCC)    a. 02/2014 Echo: EF 25-30%, sev glob HK with inferolat HK->AK, mod conc LVH, Gr 2 DD, Mild MR, sev dil LA.   CKD (chronic kidney disease), stage III (HCC)    Cocaine abuse (HCC)    Depression    DM (diabetes mellitus), type 2 (HCC)    GAD (generalized anxiety disorder)    HFrEF (heart failure with reduced ejection fraction) (HCC)    High cholesterol    Hypertension    Morbid obesity (HCC)    OSA (obstructive sleep apnea)    Stroke (HCC)    Tobacco abuse    Family History:  Family History  Problem Relation Age of Onset   Heart attack Father        died @ 49   Cervical cancer Mother        died @ 57    Current Medications: Current Facility-Administered Medications  Medication Dose Route Frequency Provider Last Rate Last Admin   acetaminophen (TYLENOL) tablet 650 mg  650 mg Oral Q6H PRN Sindy Guadeloupe, NP       aspirin EC tablet 81 mg  81 mg Oral Daily Meryl Dare, MD   81 mg at 01/15/24 0818   atorvastatin (LIPITOR) tablet 80 mg  80 mg Oral Daily Meryl Dare, MD   80 mg at 01/15/24 0817   ezetimibe (ZETIA) tablet 10 mg  10 mg Oral Daily Meryl Dare, MD   10 mg at 01/15/24 0817   furosemide (LASIX) tablet 40 mg  40 mg Oral Daily Meryl Dare, MD   40 mg at 01/15/24 0817   gabapentin (NEURONTIN) capsule 100 mg  100 mg Oral Q8H Massengill, Harrold Donath, MD   100 mg at 01/15/24 3220   hydrocortisone cream 1 %   Topical TID Meryl Dare, MD   Given at 01/14/24 1729   hydrOXYzine (ATARAX) tablet 10 mg  10 mg Oral TID PRN Meryl Dare, MD   10 mg at 01/14/24 2139   isosorbide-hydrALAZINE (BIDIL) 20-37.5 MG per tablet 2 tablet  2 tablet Oral TID Onuoha, Chinwendu V, NP   2 tablet at 01/15/24 0641   losartan (COZAAR) tablet 100 mg  100 mg Oral Daily Onuoha, Chinwendu V, NP   100 mg at 01/15/24 0644   metFORMIN (GLUCOPHAGE-XR) 24 hr tablet 500 mg  500 mg Oral Q breakfast Meryl Dare, MD   500 mg at 01/15/24 0818   nicotine (NICODERM CQ - dosed in mg/24  hours) patch 14 mg  14 mg Transdermal Daily PRN Meryl Dare, MD       OLANZapine (ZYPREXA) injection 10 mg  10 mg Intramuscular TID PRN Sindy Guadeloupe, NP       OLANZapine (ZYPREXA) injection 5 mg  5 mg Intramuscular TID PRN Sindy Guadeloupe, NP       OLANZapine zydis (ZYPREXA) disintegrating tablet 5 mg  5 mg Oral TID PRN Sindy Guadeloupe, NP       sertraline (ZOLOFT) tablet 125 mg  125 mg Oral Daily Meryl Dare, MD   125 mg at 01/15/24 0818   spironolactone (ALDACTONE) tablet 25 mg  25 mg Oral Daily Meryl Dare, MD   25 mg at 01/15/24 0818    Lab Results:  Results for orders placed  or performed during the hospital encounter of 01/13/24 (from the past 48 hours)  CBC with Differential/Platelet     Status: None   Collection Time: 01/13/24  9:47 PM  Result Value Ref Range   WBC 4.0 4.0 - 10.5 K/uL   RBC 4.61 4.22 - 5.81 MIL/uL   Hemoglobin 13.8 13.0 - 17.0 g/dL   HCT 40.9 81.1 - 91.4 %   MCV 89.4 80.0 - 100.0 fL   MCH 29.9 26.0 - 34.0 pg   MCHC 33.5 30.0 - 36.0 g/dL   RDW 78.2 95.6 - 21.3 %   Platelets 224 150 - 400 K/uL   nRBC 0.0 0.0 - 0.2 %   Neutrophils Relative % 58 %   Neutro Abs 2.3 1.7 - 7.7 K/uL   Lymphocytes Relative 32 %   Lymphs Abs 1.3 0.7 - 4.0 K/uL   Monocytes Relative 8 %   Monocytes Absolute 0.3 0.1 - 1.0 K/uL   Eosinophils Relative 1 %   Eosinophils Absolute 0.0 0.0 - 0.5 K/uL   Basophils Relative 1 %   Basophils Absolute 0.0 0.0 - 0.1 K/uL   Immature Granulocytes 0 %   Abs Immature Granulocytes 0.01 0.00 - 0.07 K/uL    Comment: Performed at [facility], [address] [place], [place] [address]  Comprehensive metabolic panel     Status: Abnormal   Collection Time: 01/13/24  9:47 PM  Result Value Ref Range   Sodium 138 135 - 145 mmol/L   Potassium 4.2 3.5 - 5.1 mmol/L   Chloride 104 98 - 111 mmol/L   CO2 26 22 - 32 mmol/L   Glucose, Bld 123 (H) 70 - 99 mg/dL    Comment: Glucose reference range applies only to samples taken after fasting for at  least 8 hours.   BUN 15 6 - 20 mg/dL   Creatinine, Ser 8.46 (H) 0.61 - 1.24 mg/dL   Calcium 9.3 8.9 - 96.2 mg/dL   Total Protein 6.5 6.5 - 8.1 g/dL   Albumin 3.5 3.5 - 5.0 g/dL   AST 16 15 - 41 U/L   ALT 10 0 - 44 U/L   Alkaline Phosphatase 55 38 - 126 U/L   Total Bilirubin 0.7 0.0 - 1.2 mg/dL   GFR, Estimated 58 (L) >60 mL/min    Comment: (NOTE) Calculated using the CKD-EPI Creatinine Equation (2021)    Anion gap 8 5 - 15    Comment: Performed at [facility], [address] [place], [place] [address]  Ethanol     Status: None   Collection Time: 01/13/24  9:47 PM  Result Value Ref Range   Alcohol, Ethyl (B) <10 <10 mg/dL    Comment: (NOTE) Lowest detectable limit for serum alcohol is 10 mg/dL.  For medical purposes only. Performed at [facility], [address] [place], [place] [address]   TSH     Status: None   Collection Time: 01/13/24  9:47 PM  Result Value Ref Range   TSH 0.772 0.350 - 4.500 uIU/mL    Comment: Performed by a 3rd Generation assay with a functional sensitivity of <=0.01 uIU/mL. Performed at [facility], [address] [place], [place] [address]   POCT Urine Drug Screen - (I-Screen)     Status: Abnormal   Collection Time: 01/13/24  9:48 PM  Result Value Ref Range   POC Amphetamine UR None Detected NONE DETECTED (Cut Off Level 1000 ng/mL)   POC Secobarbital (BAR)  None Detected NONE DETECTED (Cut Off Level 300 ng/mL)   POC Buprenorphine (BUP) None Detected NONE DETECTED (Cut Off Level 10 ng/mL)   POC Oxazepam (BZO) None Detected NONE DETECTED (Cut Off Level 300 ng/mL)   POC Cocaine UR Positive (A) NONE DETECTED (Cut Off Level 300 ng/mL)   POC Methamphetamine UR None Detected NONE DETECTED (Cut Off Level 1000 ng/mL)   POC Morphine None Detected NONE DETECTED (Cut Off Level 300 ng/mL)   POC Methadone UR None Detected NONE DETECTED (Cut Off Level 300 ng/mL)   POC Oxycodone UR None Detected NONE DETECTED (Cut Off Level 100 ng/mL)   POC  Marijuana UR None Detected NONE DETECTED (Cut Off Level 50 ng/mL)    Blood Alcohol level:  Lab Results  Component Value Date   ETH <10 01/13/2024   ETH <10 11/16/2023    Metabolic Labs: Lab Results  Component Value Date   HGBA1C 6.2 (H) 11/17/2023   MPG 131.24 11/17/2023   MPG 111.15 12/27/2020   Lab Results  Component Value Date   PROLACTIN 4.8 02/14/2022   Lab Results  Component Value Date   CHOL 167 07/04/2023   TRIG 116 07/04/2023   HDL 41 07/04/2023   CHOLHDL 4.1 07/04/2023   VLDL 23 07/04/2023   LDLCALC 103 (H) 07/04/2023   LDLCALC 16 07/19/2022    Physical Findings: AIMS: No   Psychiatric Specialty Exam:  Presentation  General Appearance: Appropriate for Environment  Eye Contact:Good  Speech:Normal Rate  Speech Volume:Normal  Handedness:Right   Mood and Affect  Mood:Euthymic; Anxious  Affect:Congruent   Thought Process  Thought Processes:Coherent; Linear  Descriptions of Associations:Intact  Orientation:Full (Time, Place and Person)  Thought Content:Logical  History of Schizophrenia/Schizoaffective disorder:No  Duration of Psychotic Symptoms:No data recorded Hallucinations:Hallucinations: None  Ideas of Reference:None  Suicidal Thoughts:Suicidal Thoughts: No  Homicidal Thoughts:Homicidal Thoughts: No   Sensorium  Memory:Immediate Good; Recent Good; Remote Good  Judgment:Fair  Insight:Fair   Executive Functions  Concentration:Good  Attention Span:Good  Recall:Good  Fund of Knowledge:Good  Language:Good   Psychomotor Activity  Psychomotor Activity:Psychomotor Activity: Normal   Assets  Assets:Desire for Improvement; Housing   Sleep  Sleep:Sleep: Good (slept "gooooood") Number of Hours of Sleep: 6.75    Physical Exam: Physical Exam Vitals and nursing note reviewed.  HENT:     Head: Normocephalic and atraumatic.  Pulmonary:     Effort: Pulmonary effort is normal.  Skin:    Comments: 2 cm raised  bump that is not erythematous and nontender.  Feels cystic.  Neurological:     General: No focal deficit present.     Mental Status: He is alert.    Review of Systems  Constitutional:  Negative for fever.  Cardiovascular:  Negative for chest pain and palpitations.  Gastrointestinal:  Negative for constipation, diarrhea, nausea and vomiting.  Neurological:  Negative for dizziness, weakness and headaches.   Blood pressure (!) 147/91, pulse 76, temperature 97.9 F (36.6 C), resp. rate 16, height 5\' 9"  (1.753 m), weight 71.2 kg, SpO2 100%. Body mass index is 23.18 kg/m.   Treatment Plan Summary: Daily contact with patient to assess and evaluate symptoms and progress in treatment and Medication management     ASSESSMENT & PLAN   ASSESSMENT:   Diagnoses / Active Problems:  Principal Problem:   Panic disorder Active Problems:   Nicotine use disorder   GAD (generalized anxiety disorder)   Cocaine use disorder, severe, dependence (HCC)    Matthew [name]. is a 58  y.o. male with past history of MDD, GAD, cocaine use disorder, psychiatric hospitalizations in 2022 and 2023 for SI who presents with passive suicidal thoughts and worsening anxiety.  Patient presented with passive suicidal thoughts which do not appear to be related to depression currently as he is denying depressive symptoms including depressed mood and anhedonia.  Suicidal thoughts likely manifestation of panic attacks and worsening psychosocial functioning.  Regardless, on interview in the hospital, he is denying suicidal thoughts now. Patient does endorse experiences and symptomatology consistent with trauma/stress-related disorder and would likely benefit from therapy around this but was not given this diagnosis as the overall biopsychosocial narrative did not seem consistent with PTSD.  For panic disorder and anxiety, we will plan to split gabapentin dose from 300 once at night to 100 three times daily to better treat  anxiety.  We will also add hydroxyzine for acute anxiety but only low-dose to minimize anticholinergic burden given history of stroke predisposing patient to dementia.  Additionally we considered increasing sertraline or adjuncting with buspirone and patient opted for the increase in sertraline which we will hold to increase until tomorrow given we are adjusting gabapentin today.  Patient would also benefit from therapist.  Will inquire if patient wants to reconsider rehab or similar intervention for cocaine cessation.  Provided nicotine patch for nicotine cessation.  01/15/24: Tolerating increasing sertraline okay and gabapentin frequency change okay.  Plan increase sertraline to 150 tomorrow.  Continue to denies suicidal thoughts.  Discussed SA IOP with the patient but he still seems ambivalent so we will reconsider tomorrow.  Due to persistent elevated blood pressures we will add on clonidine as needed and encourage patient to follow up with his PCP/cardiologist for adjustments to HTN / heart failure medications.  Plan to discharge by Friday morning if patient continues to do well.   PLAN: Safety and Monitoring:             -- Voluntary admission to inpatient psychiatric unit for safety, stabilization and treatment             -- Daily contact with patient to assess and evaluate symptoms and progress in treatment             -- Patient's case to be discussed in multi-disciplinary team meeting             -- Observation Level : q15 minute checks             -- Vital signs:  q12 hours             -- Precautions: suicide, elopement, and assault   2. Psychiatric Diagnoses and Treatment:  -- Continue sertraline 125 mg once daily for GAD, panic disorder -- Continue gabapentin 100 mg 3 times daily for anxiety, neuropathic pain             -- Continue hydroxyzine 10 mg 3 times daily as needed for anxiety   --  The risks/benefits/side-effects/alternatives to this medication were discussed in detail with  the patient and time was given for questions. The patient consents to medication trial.              -- Metabolic profile and EKG monitoring obtained while on an atypical antipsychotic. See #4 below for values.              -- Encouraged patient to participate in unit milieu and in scheduled group therapies              --  Short Term Goals: Ability to identify changes in lifestyle to reduce recurrence of condition will improve, Ability to verbalize feelings will improve, Ability to disclose and discuss suicidal ideas, Ability to demonstrate self-control will improve, Ability to identify and develop effective coping behaviors will improve, Ability to maintain clinical measurements within normal limits will improve, Compliance with prescribed medications will improve, and Ability to identify triggers associated with substance abuse/mental health issues will improve             -- Long Term Goals: Improvement in symptoms so as ready for discharge                3. Medical Issues Being Addressed:              -- Heart failure with reduced ejection fraction: Restart Lasix 40 once daily, BiDil 2 tablets 3 times daily, losartan 100 mg once daily, spironolactone 25 mg once daily             -- Type 2 diabetes mellitus: Restart metformin 24-hour XR 500 mg once daily             -- Dyslipidemia: Restart atorvastatin 80 mg once daily, Zetia 10 mg once daily             -- History of stroke: Restart aspirin EC 81 mg once daily             -- Nicotine use disorder: Start nicotine patch 14 mg once daily as needed  -- HTN: Add clonidine 0.1 mg once every 4 hours as needed for elevated blood pressures greater than 140/90 after receiving blood pressure medications               -- Continue PRN's: Tylenol, Maalox, Milk of Magnesia     4. Routine and other pertinent labs reviewed: EKG monitoring: QTc: 453 Ethanol level: Negative UDS: Positive for cocaine CMP: Unremarkable except creatinine 1.43 (baseline) CBC:  Unremarkable  Metabolism / endocrine: BMI: Body mass index is 23.18 kg/m. Prolactin: Lab Results  Component Value Date   PROLACTIN 4.8 02/14/2022   Lipid Panel: Lab Results  Component Value Date   CHOL 167 07/04/2023   TRIG 116 07/04/2023   HDL 41 07/04/2023   CHOLHDL 4.1 07/04/2023   VLDL 23 07/04/2023   LDLCALC 103 (H) 07/04/2023   LDLCALC 16 07/19/2022   HbgA1c: HbA1c, POC (controlled diabetic range) (%)  Date Value  11/04/2023 6.1   Hgb A1c MFr Bld (%)  Date Value  11/17/2023 6.2 (H)   TSH: TSH (uIU/mL)  Date Value  01/13/2024 0.772  03/02/2014 1.478    Labs to order: None currently  5. Discharge Planning:              -- Social work and case management to assist with discharge planning and identification of hospital follow-up needs prior to discharge             -- Estimated LOS: 01/17/24             -- Discharge Concerns: Need to establish a safety plan; Medication compliance and effectiveness             -- Discharge Goals: Return home with outpatient referrals for mental health follow-up including medication management/psychotherapy    I certify that inpatient services furnished can reasonably be expected to improve the patient's condition.   This note was created using a voice recognition software as a result there may be grammatical errors inadvertently enclosed that  do not reflect the nature of this encounter. Every attempt is made to correct such errors.   Meryl Dare, MD PGY-1 Psychiatry Resident 01/15/2024, 8:50 AM

## 2024-01-15 NOTE — Plan of Care (Signed)
Problem: Safety: Goal: Periods of time without injury will increase Outcome: Progressing

## 2024-01-15 NOTE — Group Note (Signed)
Recreation Therapy Group Note   Group Topic:Health and Wellness  Group Date: 01/15/2024 Start Time: 0930 End Time: 1000 Facilitators: Lari Linson-McCall, LRT,CTRS Location: [address] Dayroom   Group Topic: Wellness  Goal Area(s) Addresses:  Patient will define components of whole wellness. Patient will verbalize benefit of whole wellness.  Intervention: Music  Activity: Exercise. Patients took turns leading the group in the exercises of their choosing. The group completed three rounds of stretching and exercise. Patients were encouraged to get water, take breaks and not to over extend themselves during the course of the activity. Emphasis was placed on patients paying attention to their bodies and what exercises they are able to complete.   Education: Wellness, Building control surveyor.   Education Outcome: Acknowledges education/In group clarification offered/Needs additional education.   Affect/Mood: Appropriate   Participation Level: Engaged   Participation Quality: Independent   Behavior: Appropriate   Speech/Thought Process: Focused   Insight: Good   Judgement: Good   Modes of Intervention: Music   Patient Response to Interventions:  Engaged   Education Outcome:  In group clarification offered    Clinical Observations/Individualized Feedback: Pt was bright and social during group session. Pt did have some moments of lag during group. Pt expressed in was due to his medication. Pt was encouraged to take it easy or sit down if needed. Pt was appropriate and had a good sense of humor throughout group session.     Plan: Continue to engage patient in RT group sessions 2-3x/week.   Lelend Heinecke-McCall, LRT,CTRS 01/15/2024 12:25 PM

## 2024-01-15 NOTE — Progress Notes (Signed)
   01/15/24 2200  Psych Admission Type (Psych Patients Only)  Admission Status Voluntary  Psychosocial Assessment  Patient Complaints None  Eye Contact Fair  Facial Expression Anxious  Affect Appropriate to circumstance  Speech Logical/coherent  Interaction Assertive  Motor Activity Other (Comment) (WNL)  Appearance/Hygiene Unremarkable  Behavior Characteristics Cooperative;Appropriate to situation  Mood Pleasant  Thought Process  Coherency WDL  Content WDL  Delusions None reported or observed  Perception WDL  Hallucination None reported or observed  Judgment Limited  Confusion None  Danger to Self  Current suicidal ideation? Denies  Description of Suicide Plan None  Self-Injurious Behavior No self-injurious ideation or behavior indicators observed or expressed   Agreement Not to Harm Self Yes  Description of Agreement Verbal contract for safety  Danger to Others  Danger to Others None reported or observed

## 2024-01-16 DIAGNOSIS — F410 Panic disorder [episodic paroxysmal anxiety] without agoraphobia: Secondary | ICD-10-CM | POA: Diagnosis not present

## 2024-01-16 MED ORDER — EZETIMIBE 10 MG PO TABS
10.0000 mg | ORAL_TABLET | Freq: Every day | ORAL | Status: DC
  Administered 2024-01-17: 10 mg via ORAL
  Filled 2024-01-16 (×3): qty 1

## 2024-01-16 MED ORDER — METFORMIN HCL ER 500 MG PO TB24
500.0000 mg | ORAL_TABLET | Freq: Every day | ORAL | Status: DC
  Administered 2024-01-17: 500 mg via ORAL
  Filled 2024-01-16 (×3): qty 1

## 2024-01-16 MED ORDER — ASPIRIN 81 MG PO TBEC
81.0000 mg | DELAYED_RELEASE_TABLET | Freq: Every day | ORAL | Status: DC
  Administered 2024-01-17: 81 mg via ORAL
  Filled 2024-01-16 (×3): qty 1

## 2024-01-16 MED ORDER — FUROSEMIDE 40 MG PO TABS
40.0000 mg | ORAL_TABLET | Freq: Every day | ORAL | Status: DC
  Administered 2024-01-17: 40 mg via ORAL
  Filled 2024-01-16 (×3): qty 1

## 2024-01-16 MED ORDER — SPIRONOLACTONE 25 MG PO TABS
25.0000 mg | ORAL_TABLET | Freq: Every day | ORAL | Status: DC
  Administered 2024-01-17: 25 mg via ORAL
  Filled 2024-01-16 (×3): qty 1

## 2024-01-16 MED ORDER — SERTRALINE HCL 50 MG PO TABS
150.0000 mg | ORAL_TABLET | Freq: Every day | ORAL | Status: DC
  Administered 2024-01-17: 150 mg via ORAL
  Filled 2024-01-16 (×3): qty 3

## 2024-01-16 MED ORDER — ATORVASTATIN CALCIUM 80 MG PO TABS
80.0000 mg | ORAL_TABLET | Freq: Every day | ORAL | Status: DC
  Administered 2024-01-17: 80 mg via ORAL
  Filled 2024-01-16 (×3): qty 1

## 2024-01-16 NOTE — Group Note (Signed)
LCSW Group Therapy Note   Group Date: 01/16/2024 Start Time: 1100 End Time: 1200   Type of Therapy:  Group Therapy  Topic:  Understanding Your Path to Change  Participation:  did not attend  Objective:  The goal is to help participants understand the stages of change, identify where they currently are in the process, and provide actionable next steps to continue moving forward in their journey of change.  Goals: Learn about the six stages of change:  Precontemplation, Contemplation, Preparation, Action, Maintenance, and Relapse Reflect on Current Change Efforts:  Recognize which stage participants are in regarding to a personal change. Plan Next Steps for Moving Forward:  Create an action plan based on their current stage of change.  Class Summary:  In this session, we explored the Stages of Change as a framework to understand the process of change.  We discussed how each stage helps individuals recognize where they are in their personal journey and used the Stages of Change Worksheet for self-reflection.  Participants answered questions to better understand their current stage, challenges, and progress. We also emphasized the importance of moving forward, even if setbacks (Relapse) occur, and created actionable steps to help participants continue progressing. By the end of the session, participants gained a clearer understanding of their path to change and left with a clear plan for next steps.  Therapeutic Modalities:  Elements of CBT (cognitive restructuring, problem solving)  Element of DBT (mindfulness, distress tolerance)  Rozann Holts O Chavie Kolinski, LCSWA 01/16/2024  6:27 PM

## 2024-01-16 NOTE — Care Management Important Message (Signed)
Patient informed of right to appeal discharge, provided phone number to [facility]. Patient expressed no interest in appealing discharge at this time. CSW will continue to monitor situation.   Mckinley Jewel, [place] 01/16/24 1424

## 2024-01-16 NOTE — Group Note (Signed)
Date:  01/16/2024 Time:  9:28 PM  Group Topic/Focus:  Wrap-Up Group:   The focus of this group is to help patients review their daily goal of treatment and discuss progress on daily workbooks.    Participation Level:  Active  Participation Quality:  Appropriate and Sharing  Affect:  Appropriate  Cognitive:  Appropriate  Insight: Appropriate  Engagement in Group:  Engaged  Modes of Intervention:  Activity and Socialization  Additional Comments:  Patients used and completed wrap up group sheets during group. Patient rated his day a 8/10. Patient shared that his goal for today was "reestablish my discharge fro tomorrow". Patient shared that he did achieve his goal. Patient shared coping skills that he finds most helpful is "patience and communication". Patient shared something that he likes about himself is "I came to get help for my anxiety and depression". Patient participated in group activity after sharing.   Jayse Hodkinson 01/16/2024, 9:28 PM

## 2024-01-16 NOTE — Progress Notes (Signed)
Patient ID: [name], male   DOB: [birth year], 58 y.o.   MRN: [id number]  CSW met with Lia Hopping on the unit. He was in the day room engaged with peers. Pt presented as bubbly and shared that he "made himself" get up, as not to sleep this afternoon. CSW discussed discharge planning. Pt confirms he will need transportation assistance at discharge and confirmed it will be to the address listed in Epic. Discussed plans for services post discharge and Pt is agreeable to engagement in SA-IOP with [facility], preferably with an afternoon appointment. Medicare IM given during F2F and Pt is agreeable to discharge tomorrow. He does have preference for early AM discharge due to having "business to to take care of" - citing a need to make multiple bill payments.   CSW will continue to follow, plan is for discharge tomorrow, 01/17/2024.    Mckinley Jewel, [place]  01/16/24  1431

## 2024-01-16 NOTE — Progress Notes (Signed)
[facility] MD Progress Note  01/16/2024 11:49 AM Matthew [name].  MRN:  [id number]  Principal Problem: Panic disorder Diagnosis: Principal Problem:   Panic disorder Active Problems:   Nicotine use disorder   GAD (generalized anxiety disorder)   Cocaine use disorder, severe, dependence (HCC)   Reason for Admission:  Matthew Pitsenbarger. is a 58 y.o. male  with a documented past psychiatric history of MDD, GAD, stimulant use disorder (cocaine), hospitalizations in 2022 and 2023 for SI. Patient initially arrived to [facility] on 01/13/24 for panic attacks, and admitted to [facility] on 01/14/24 for stabilization of acute on chronic psychiatric conditions. PMHx is significant for HFrEF, HTN, T2DM, prior CVA, and CKD 3a.  (admitted on 01/14/2024, total  LOS: 2 days )   Yesterday, the psychiatry team made following recommendations:  -- Continue sertraline 125 mg once daily for panic disorder, GAD and increase to 150 once daily starting tomorrow morning -- Continue gabapentin 100 mg 3 times daily for anxiety, neuropathic pain             -- Continue hydroxyzine 10 mg 3 times daily as needed for anxiety   Pertinent information discussed during interdisciplinary rounds: No concerns for nursing except for elevated blood pressures.  Chart review last 24 hours: 156/102, 127/75 after clonidine, 153/101 before morning meds, 118/74 after morning meds and clonidine  Information Obtained Today During Patient Interview:  Patient reports that he is doing okay this morning from a mental health standpoint but reports that he is having a lot of dizziness from the clonidine either as a side effect.  He also reported that he feels like his these are going to "buckle."  He was asking to not be given the clonidine again.  He reports that he slept well last night.  Reports that his appetite is good except that he had to skip breakfast this morning because of the dizziness.  Patient denies SI, HI, AVH.  He reports that he  is not interested in SA IOP but social work will touch base in the afternoon when he is feeling better to ask him again.  In 4 patient I will come back in the afternoon as well to make sure his blood pressures are fine that he is not having the dizziness anymore.    Past Psychiatric History:  Current Psychiatrist: None, PCP prescribes sertraline Current Therapist: None Previous Psychiatric Diagnoses: MDD, GAD, stimulant use disorder (cocaine) Psychiatric Medications: Current Sertraline 100 mg once daily for MDD Past Trazodone, reasonably discontinued in past due to patient's heart failure Prozac, lack of efficacy per chart Seroquel 200, "slept good," lack of efficacy for chart  Psychiatric Hospitalization hx: 2022 and 2023 for SI Psychotherapy hx: No history Neuromodulation history: Denies History of suicide: Denies History of homicide or aggression: Denies  Family History: Medical: Cervical cancer in mother, heart attack in father at 22 Psychiatric Dx: Denies Suicide Hx: Denies Violence/Aggression: Denies Substance use: Denies  Social History:  Developmental: Did not describe except that both parents are deceased currently.  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 02/2014 Echo: EF 25-30%.   Blindness of right eye    Cardiomyopathy (HCC)    a. 02/2014 Echo: EF 25-30%, sev glob HK with inferolat HK->AK, mod  conc LVH, Gr 2 DD, Mild MR, sev dil LA.   CKD (chronic kidney disease), stage III (HCC)    Cocaine abuse (HCC)    Depression    DM (diabetes mellitus), type 2 (HCC)    GAD (generalized anxiety disorder)    HFrEF (heart failure with reduced ejection fraction) (HCC)    High cholesterol    Hypertension    Morbid obesity (HCC)    OSA (obstructive sleep apnea)    Stroke (HCC)    Tobacco abuse    Family History:  Family History  Problem Relation Age of Onset   Heart attack Father        died @ 38   Cervical cancer Mother        died @ 30    Current Medications: Current Facility-Administered Medications  Medication Dose Route Frequency Provider Last Rate Last Admin   acetaminophen (TYLENOL) tablet 650 mg  650 mg Oral Q6H PRN Sindy Guadeloupe, NP       Melene Muller ON 01/17/2024] aspirin EC tablet 81 mg  81 mg Oral Daily Meryl Dare, MD       Melene Muller ON 01/17/2024] atorvastatin (LIPITOR) tablet 80 mg  80 mg Oral Daily Meryl Dare, MD       Melene Muller ON 01/17/2024] ezetimibe (ZETIA) tablet 10 mg  10 mg Oral Daily Meryl Dare, MD       Melene Muller ON 01/17/2024] furosemide (LASIX) tablet 40 mg  40 mg Oral Daily Meryl Dare, MD       gabapentin (NEURONTIN) capsule 100 mg  100 mg Oral Q8H Massengill, Harrold Donath, MD   100 mg at 01/16/24 8469   hydrocortisone cream 1 %   Topical TID Meryl Dare, MD   Given at 01/16/24 0810   hydrOXYzine (ATARAX) tablet 10 mg  10 mg Oral TID PRN Meryl Dare, MD   10 mg at 01/15/24 2136   isosorbide-hydrALAZINE (BIDIL) 20-37.5 MG per tablet 2 tablet  2 tablet Oral TID Welford Roche, Chinwendu V, NP   2 tablet at 01/16/24 0809   losartan (COZAAR) tablet 100 mg  100 mg Oral Daily Onuoha, Chinwendu V, NP   100 mg at 01/16/24 0810   [START ON 01/17/2024] metFORMIN (GLUCOPHAGE-XR) 24 hr tablet 500 mg  500 mg Oral Q breakfast Meryl Dare, MD       nicotine (NICODERM CQ - dosed in mg/24 hours) patch 14 mg  14 mg Transdermal Daily PRN Meryl Dare, MD       OLANZapine (ZYPREXA)  injection 10 mg  10 mg Intramuscular TID PRN Sindy Guadeloupe, NP       OLANZapine (ZYPREXA) injection 5 mg  5 mg Intramuscular TID PRN Sindy Guadeloupe, NP       OLANZapine zydis (ZYPREXA) disintegrating tablet 5 mg  5 mg Oral TID PRN Sindy Guadeloupe, NP       Melene Muller ON 01/17/2024] sertraline (ZOLOFT) tablet 150 mg  150 mg Oral Daily Meryl Dare, MD       Melene Muller ON 01/17/2024] spironolactone (ALDACTONE) tablet 25 mg  25 mg Oral Daily Meryl Dare, MD        Lab Results:  No results found for this or any previous visit (from the past  48 hours).   Blood Alcohol level:  Lab Results  Component Value Date   ETH <10 01/13/2024   ETH <10 11/16/2023    Metabolic Labs: Lab Results  Component Value Date   HGBA1C 6.2 (H) 11/17/2023   MPG 131.24 11/17/2023   MPG 111.15 12/27/2020   Lab Results  Component Value Date   PROLACTIN 4.8 02/14/2022   Lab Results  Component Value Date   CHOL 167 07/04/2023   TRIG 116 07/04/2023   HDL 41 07/04/2023   CHOLHDL 4.1 07/04/2023   VLDL 23 07/04/2023   LDLCALC 103 (H) 07/04/2023   LDLCALC 16 07/19/2022    Physical Findings: AIMS: No   Psychiatric Specialty Exam:  Presentation  General Appearance: Appropriate for Environment  Eye Contact:Good  Speech:Normal Rate  Speech Volume:Normal  Handedness:Right   Mood and Affect  Mood:Euthymic  Affect:Appropriate; Congruent; Full Range   Thought Process  Thought Processes:Coherent; Linear  Descriptions of Associations:Intact  Orientation:Full (Time, Place and Person)  Thought Content:Logical  History of Schizophrenia/Schizoaffective disorder:No  Duration of Psychotic Symptoms:No data recorded Hallucinations:Hallucinations: None  Ideas of Reference:None  Suicidal Thoughts:Suicidal Thoughts: No  Homicidal Thoughts:Homicidal Thoughts: No   Sensorium  Memory:Immediate Good; Recent Good; Remote Good  Judgment:Fair  Insight:Fair   Executive Functions   Concentration:Good  Attention Span:Good  Recall:Good  Fund of Knowledge:Good  Language:Good   Psychomotor Activity  Psychomotor Activity:Psychomotor Activity: Normal   Assets  Assets:Desire for Improvement; Housing; Leisure Time   Sleep  Sleep:Sleep: Fair Number of Hours of Sleep: 6.75    Physical Exam: Physical Exam Vitals and nursing note reviewed.  HENT:     Head: Normocephalic and atraumatic.  Pulmonary:     Effort: Pulmonary effort is normal.  Skin:    Comments: 2 cm raised bump that is not erythematous and nontender.  Feels cystic.  Neurological:     Mental Status: He is alert.     Gait: Gait normal.     Comments: No focal deficit of the facial muscles, upper extremities, lower extremities.  No gaze deviation.  Speech without aphasia or dysphagia.  Visual fields intact.    Review of Systems  Constitutional:  Negative for fever.  Cardiovascular:  Negative for chest pain and palpitations.  Gastrointestinal:  Negative for constipation, diarrhea, nausea and vomiting.  Neurological:  Positive for dizziness. Negative for weakness and headaches.   Blood pressure 118/74, pulse 63, temperature 97.9 F (36.6 C), resp. rate 16, height 5\' 9"  (1.753 m), weight 71.2 kg, SpO2 100%. Body mass index is 23.18 kg/m.   Treatment Plan Summary: Daily contact with patient to assess and evaluate symptoms and progress in treatment and Medication management     ASSESSMENT & PLAN   ASSESSMENT:   Diagnoses / Active Problems:  Principal Problem:   Panic disorder Active Problems:   Nicotine use disorder   GAD (generalized anxiety disorder)   Cocaine use disorder, severe, dependence (HCC)    Matthew [name]. is a 58 y.o. male with past history of MDD, GAD, cocaine use disorder, psychiatric hospitalizations in 2022 and 2023 for SI who presents with passive suicidal thoughts and worsening anxiety.  Patient presented with passive suicidal thoughts which do not appear to be  related to depression currently as he is denying depressive symptoms including depressed mood and anhedonia.  Suicidal thoughts likely manifestation of panic attacks and worsening psychosocial functioning.  Regardless, on interview in the hospital, he is denying suicidal thoughts now. Patient does endorse experiences and symptomatology consistent  with trauma/stress-related disorder and would likely benefit from therapy around this but was not given this diagnosis as the overall biopsychosocial narrative did not seem consistent with PTSD.  For panic disorder and anxiety, we will plan to split gabapentin dose from 300 once at night to 100 three times daily to better treat anxiety.  We will also add hydroxyzine for acute anxiety but only low-dose to minimize anticholinergic burden given history of stroke predisposing patient to dementia.  Additionally we considered increasing sertraline or adjuncting with buspirone and patient opted for the increase in sertraline which we will hold to increase until tomorrow given we are adjusting gabapentin today.  Patient would also benefit from therapist.  Will inquire if patient wants to reconsider rehab or similar intervention for cocaine cessation.  Provided nicotine patch for nicotine cessation.  01/15/24: Tolerating increasing sertraline okay and gabapentin frequency change okay.  Plan increase sertraline to 150 tomorrow.  Continue to denies suicidal thoughts.  Discussed SA IOP with the patient but he still seems ambivalent so we will reconsider tomorrow.  Due to persistent elevated blood pressures we will add on clonidine as needed and encourage patient to follow up with his PCP/cardiologist for adjustments to HTN / heart failure medications.  Plan to discharge by Friday morning if patient continues to do well.  1/30: Patient denies side effects to psychiatric medication changes including sertraline 150 mg this morning.  However patient reports significant dizziness on as  needed clonidine and is bedbound from dizziness and concern for falling, so we will discontinue clonidine and only restart antihypertensive protocol with blood pressures greater than 180/110 (confirmed on >1 reading) after receiving BP medications (expect it to be elevated prior to meds).  Patient reports today that he does not want to do SA IOP.  Plan still for discharge tomorrow and put follow reminders in patient's discharge for his PCP, cardiologist, and establish with electrophysiologist.   PLAN: Safety and Monitoring:             -- Voluntary admission to inpatient psychiatric unit for safety, stabilization and treatment             -- Daily contact with patient to assess and evaluate symptoms and progress in treatment             -- Patient's case to be discussed in multi-disciplinary team meeting             -- Observation Level : q15 minute checks             -- Vital signs:  q12 hours             -- Precautions: suicide, elopement, and assault   2. Psychiatric Diagnoses and Treatment:  -- Increased sertraline to 150 mg once daily for GAD, panic disorder -- Continue gabapentin 100 mg 3 times daily for anxiety, neuropathic pain             -- Continue hydroxyzine 10 mg 3 times daily as needed for anxiety   --  The risks/benefits/side-effects/alternatives to this medication were discussed in detail with the patient and time was given for questions. The patient consents to medication trial.              -- Metabolic profile and EKG monitoring obtained while on an atypical antipsychotic. See #4 below for values.              -- Encouraged patient to participate in unit milieu and in scheduled group therapies              --  Short Term Goals: Ability to identify changes in lifestyle to reduce recurrence of condition will improve, Ability to verbalize feelings will improve, Ability to disclose and discuss suicidal ideas, Ability to demonstrate self-control will improve, Ability to identify and  develop effective coping behaviors will improve, Ability to maintain clinical measurements within normal limits will improve, Compliance with prescribed medications will improve, and Ability to identify triggers associated with substance abuse/mental health issues will improve             -- Long Term Goals: Improvement in symptoms so as ready for discharge                3. Medical Issues Being Addressed:              -- Heart failure with reduced ejection fraction: Restart Lasix 40 once daily, BiDil 2 tablets 3 times daily, losartan 100 mg once daily, spironolactone 25 mg once daily             -- Type 2 diabetes mellitus: Restart metformin 24-hour XR 500 mg once daily             -- Dyslipidemia: Restart atorvastatin 80 mg once daily, Zetia 10 mg once daily             -- History of stroke: Restart aspirin EC 81 mg once daily             -- Nicotine use disorder: Start nicotine patch 14 mg once daily as needed  -- HTN: Discontinued clonidine due to dizziness               -- Continue PRN's: Tylenol, Maalox, Milk of Magnesia     4. Routine and other pertinent labs reviewed: EKG monitoring: QTc: 453 Ethanol level: Negative UDS: Positive for cocaine CMP: Unremarkable except creatinine 1.43 (baseline) CBC: Unremarkable  Metabolism / endocrine: BMI: Body mass index is 23.18 kg/m. Prolactin: Lab Results  Component Value Date   PROLACTIN 4.8 02/14/2022   Lipid Panel: Lab Results  Component Value Date   CHOL 167 07/04/2023   TRIG 116 07/04/2023   HDL 41 07/04/2023   CHOLHDL 4.1 07/04/2023   VLDL 23 07/04/2023   LDLCALC 103 (H) 07/04/2023   LDLCALC 16 07/19/2022   HbgA1c: HbA1c, POC (controlled diabetic range) (%)  Date Value  11/04/2023 6.1   Hgb A1c MFr Bld (%)  Date Value  11/17/2023 6.2 (H)   TSH: TSH (uIU/mL)  Date Value  01/13/2024 0.772  03/02/2014 1.478    Labs to order: None currently  5. Discharge Planning:              -- Social work and case  management to assist with discharge planning and identification of hospital follow-up needs prior to discharge             -- Estimated LOS: 01/17/24             -- Discharge Concerns: Need to establish a safety plan; Medication compliance and effectiveness             -- Discharge Goals: Return home with outpatient referrals for mental health follow-up including medication management/psychotherapy    I certify that inpatient services furnished can reasonably be expected to improve the patient's condition.   This note was created using a voice recognition software as a result there may be grammatical errors inadvertently enclosed that do not reflect the nature of this encounter. Every attempt is made to correct such errors.  Meryl Dare, MD PGY-1 Psychiatry Resident 01/16/2024, 11:49 AM

## 2024-01-16 NOTE — [facility] Group Notes (Signed)
Adult Psychoeducational Group Note  Date:  01/16/2024 Time:  2:12 PM  Group Topic/Focus:  Dimensions of Wellness:   The focus of this group is to introduce the topic of wellness and discuss the role each dimension of wellness plays in total health.  Participation Level:  Active  Participation Quality:  Attentive  Affect:  Appropriate  Cognitive:  Alert  Insight: Appropriate  Engagement in Group:  Engaged  Modes of Intervention:  Activity  Additional Comments:  Patient attended and participated in the Emotional Wellness group activity.  Jearl Klinefelter 01/16/2024, 2:12 PM

## 2024-01-16 NOTE — Progress Notes (Signed)
   01/16/24 1000  Psych Admission Type (Psych Patients Only)  Admission Status Voluntary  Psychosocial Assessment  Patient Complaints Anxiety;Other (Comment) (wobbly knees)  Eye Contact Fair  Facial Expression Anxious  Affect Appropriate to circumstance  Speech Logical/coherent  Interaction Assertive  Motor Activity Slow  Appearance/Hygiene Unremarkable  Behavior Characteristics Cooperative  Mood Anxious  Thought Process  Coherency WDL  Content WDL  Delusions None reported or observed  Perception WDL  Hallucination None reported or observed  Judgment Limited  Confusion None  Danger to Self  Current suicidal ideation? Denies  Self-Injurious Behavior No self-injurious ideation or behavior indicators observed or expressed   Agreement Not to Harm Self Yes  Description of Agreement verbal  Danger to Others  Danger to Others None reported or observed

## 2024-01-16 NOTE — Progress Notes (Signed)
   01/16/24 2100  Psych Admission Type (Psych Patients Only)  Admission Status Voluntary  Psychosocial Assessment  Patient Complaints Anxiety  Eye Contact Fair  Facial Expression Anxious  Affect Appropriate to circumstance  Speech Logical/coherent  Interaction Assertive  Motor Activity Other (Comment) (WNL)  Appearance/Hygiene Unremarkable  Behavior Characteristics Cooperative  Mood Anxious  Thought Process  Coherency WDL  Content WDL  Delusions None reported or observed  Perception WDL  Hallucination None reported or observed  Judgment Limited  Confusion None  Danger to Self  Current suicidal ideation? Denies  Description of Suicide Plan None  Self-Injurious Behavior No self-injurious ideation or behavior indicators observed or expressed   Agreement Not to Harm Self Yes  Description of Agreement Verbal contract for safety  Danger to Others  Danger to Others None reported or observed

## 2024-01-16 NOTE — Progress Notes (Signed)
Chaplain met with Matthew [name] as a follow up to grief and loss group in which he shared about the loss of his wife. Chaplain provided listening as he shared the story of his wife's sudden and unexpected death while he was hospitalized for a heart condition. After her death, he could not return to work and has had financial concerns since that time. He continued to raise his children who are now 31 and 52. He never did any counseling after her death and felt that he was okay, but he knows it is not too late to do counseling now.  Chaplain encouraged this.  Chaplain provided listening and grief support.  [address], [place], [phone]

## 2024-01-16 NOTE — Plan of Care (Signed)
  Problem: Education: Goal: Knowledge of [facility] Education information/materials will improve Outcome: Progressing Goal: Emotional status will improve Outcome: Progressing Goal: Mental status will improve Outcome: Progressing Goal: Verbalization of understanding the information provided will improve Outcome: Progressing   Problem: Activity: Goal: Interest or engagement in activities will improve Outcome: Progressing   Problem: Coping: Goal: Ability to demonstrate self-control will improve Outcome: Progressing   Problem: Safety: Goal: Periods of time without injury will increase Outcome: Progressing

## 2024-01-16 NOTE — Plan of Care (Signed)
Problem: Education: Goal: Knowledge of [facility] Education information/materials will improve Outcome: Progressing Goal: Emotional status will improve Outcome: Progressing Goal: Mental status will improve Outcome: Progressing Goal: Verbalization of understanding the information provided will improve Outcome: Progressing   Problem: Activity: Goal: Interest or engagement in activities will improve Outcome: Progressing Goal: Sleeping patterns will improve Outcome: Progressing   Problem: Coping: Goal: Ability to verbalize frustrations and anger appropriately will improve Outcome: Progressing Goal: Ability to demonstrate self-control will improve Outcome: Progressing

## 2024-01-17 ENCOUNTER — Other Ambulatory Visit: Payer: Self-pay

## 2024-01-17 DIAGNOSIS — F410 Panic disorder [episodic paroxysmal anxiety] without agoraphobia: Secondary | ICD-10-CM | POA: Diagnosis not present

## 2024-01-17 MED ORDER — ASPIRIN 81 MG PO TBEC
81.0000 mg | DELAYED_RELEASE_TABLET | Freq: Every day | ORAL | 0 refills | Status: AC
  Filled 2024-01-17: qty 30, 30d supply, fill #0

## 2024-01-17 MED ORDER — FUROSEMIDE 40 MG PO TABS
40.0000 mg | ORAL_TABLET | Freq: Every day | ORAL | 0 refills | Status: DC
  Filled 2024-01-17: qty 30, 30d supply, fill #0

## 2024-01-17 MED ORDER — SPIRONOLACTONE 25 MG PO TABS
25.0000 mg | ORAL_TABLET | Freq: Every day | ORAL | 0 refills | Status: DC
  Filled 2024-01-17: qty 30, 30d supply, fill #0

## 2024-01-17 MED ORDER — HYDROXYZINE HCL 10 MG PO TABS
10.0000 mg | ORAL_TABLET | Freq: Three times a day (TID) | ORAL | 0 refills | Status: DC | PRN
  Filled 2024-01-17: qty 30, 10d supply, fill #0

## 2024-01-17 MED ORDER — METFORMIN HCL ER 500 MG PO TB24
500.0000 mg | ORAL_TABLET | Freq: Every day | ORAL | 0 refills | Status: DC
  Filled 2024-01-17: qty 30, 30d supply, fill #0

## 2024-01-17 MED ORDER — TIMOLOL MALEATE 0.5 % OP SOLN
1.0000 [drp] | Freq: Every day | OPHTHALMIC | 0 refills | Status: DC
  Filled 2024-01-17: qty 5, 34d supply, fill #0

## 2024-01-17 MED ORDER — GABAPENTIN 100 MG PO CAPS
100.0000 mg | ORAL_CAPSULE | Freq: Three times a day (TID) | ORAL | 0 refills | Status: DC
  Filled 2024-01-17: qty 90, 30d supply, fill #0

## 2024-01-17 MED ORDER — HYDROCORTISONE 1 % EX CREA: TOPICAL_CREAM | Freq: Three times a day (TID) | CUTANEOUS | Status: DC

## 2024-01-17 MED ORDER — ATORVASTATIN CALCIUM 80 MG PO TABS
80.0000 mg | ORAL_TABLET | Freq: Every day | ORAL | 0 refills | Status: DC
  Filled 2024-01-17: qty 30, 30d supply, fill #0

## 2024-01-17 MED ORDER — BIDIL 20-37.5 MG PO TABS
2.0000 | ORAL_TABLET | Freq: Three times a day (TID) | ORAL | 0 refills | Status: DC
  Filled 2024-01-17: qty 180, 30d supply, fill #0

## 2024-01-17 MED ORDER — MELATONIN 5 MG PO TABS: 5.0000 mg | ORAL_TABLET | Freq: Every day | ORAL | Status: AC

## 2024-01-17 MED ORDER — LOSARTAN POTASSIUM 100 MG PO TABS
100.0000 mg | ORAL_TABLET | Freq: Every day | ORAL | 0 refills | Status: DC
  Filled 2024-01-17: qty 30, 30d supply, fill #0

## 2024-01-17 MED ORDER — NICOTINE 14 MG/24HR TD PT24: 14.0000 mg | MEDICATED_PATCH | Freq: Every day | TRANSDERMAL | 0 refills | Status: DC | PRN

## 2024-01-17 MED ORDER — SERTRALINE HCL 50 MG PO TABS
150.0000 mg | ORAL_TABLET | Freq: Every day | ORAL | 0 refills | Status: DC
  Filled 2024-01-17: qty 90, 30d supply, fill #0

## 2024-01-17 MED ORDER — EZETIMIBE 10 MG PO TABS
10.0000 mg | ORAL_TABLET | Freq: Every day | ORAL | 0 refills | Status: DC
  Filled 2024-01-17: qty 30, 30d supply, fill #0

## 2024-01-17 NOTE — [facility] Suicide Risk Assessment (Signed)
Suicide Risk Assessment  Discharge Assessment    [facility] Discharge Suicide Risk Assessment   Principal Problem: Panic disorder Discharge Diagnoses: Principal Problem:   Panic disorder Active Problems:   Nicotine use disorder   GAD (generalized anxiety disorder)   Cocaine use disorder, severe, dependence (HCC)  Matthew [name]. is a 58 y.o. male  with a past psychiatric history of MDD, GAD, stimulant use disorder (cocaine), hospitalizations in 2022 and 2023 for SI. Patient initially arrived to [facility] on 01/13/24 for panic attacks, and admitted to [facility] on 01/14/24 for stabilization of acute on chronic psychiatric conditions. PMHx is significant for HFrEF, HTN, T2DM, prior CVA, and CKD 3a.   Hospital course: Patients suicidal thoughts appeared secondary to panic attacks and worsening psychosocial functioning, as opposed to mood episode. Discussed starting busprione with patient but he opted for [place] to optimize his current medications, for which we split gabapentin from once at night to three times daily dosing and increased sertraline to 150. We also added hydroxyzine but lose dose to avoid excessive anticholinergic burden given hx of stroke. Pt did not have side effects to medications. Regarding pt's cocaine use, he had a return to use and given the implications of this on his medical health with heart failure and mental health with anxiety and panic attacks, we discussed doing substance use intensive outpatient at [facility], which patient was agreeable to. Of note, pt had elevated Bps (several times SBP >160, DBP >100) while in hospital but given his extensive HFrEF regimen, we defered any adjustments to PCP/cardiology; we trialed clonidine as needed but pt could not tolerated this dose due to dizziness. Nicotine cessation was also encouraged at discharge. He was also established with psychiatry and therapy at [facility].    During the patient's hospitalization, patient had  extensive initial psychiatric evaluation, and follow-up psychiatric evaluations every day.   Psychiatric diagnoses provided upon initial assessment:  Panic disorder   Nicotine use disorder   GAD (generalized anxiety disorder)   Cocaine use disorder, severe, dependence (HCC)   Patient's psychiatric medications were adjusted on admission:  -- Continue sertraline 100 mg once daily for panic disorder, GAD and increase to 125 once daily starting tomorrow morning -- Switch gabapentin from 300 mg once at night to 100 mg 3 times daily for anxiety, neuropathic pain             -- Start hydroxyzine 10 mg 3 times daily as needed for anxiety   During the hospitalization, other adjustments were made to the patient's psychiatric medication regimen:  -- Increased sertraline to 150 mg once daily for GAD, panic disorder -- Continue gabapentin 100 mg 3 times daily for anxiety, neuropathic pain             -- Continue hydroxyzine 10 mg 3 times daily as needed for anxiety   Patient's care was discussed during the interdisciplinary team meeting every day during the hospitalization.   The patient is not having side effects to prescribed psychiatric medication.   Gradually, patient started adjusting to milieu. The patient was evaluated each day by a clinical provider to ascertain response to treatment. Improvement was noted by the patient's report of decreasing symptoms, improved sleep and appetite, affect, medication tolerance, behavior, and participation in unit programming.  Patient was asked each day to complete a self inventory noting mood, mental status, pain, new symptoms, anxiety and concerns.   Symptoms were reported as significantly decreased or resolved completely by discharge.  The  patient reports that their mood is stable.  The patient denied having suicidal thoughts for more than 48 hours prior to discharge.  Patient denies having homicidal thoughts.  Patient denies having auditory hallucinations.   Patient denies any visual hallucinations or other symptoms of psychosis.  The patient was motivated to continue taking medication with a goal of continued improvement in mental health.    The patient reports their target psychiatric symptoms of anxiety and panic attacks responded well to the psychiatric medications, and the patient reports overall benefit other psychiatric hospitalization. Supportive psychotherapy was provided to the patient. The patient also participated in regular group therapy while hospitalized. Coping skills, problem solving as well as relaxation therapies were also part of the unit programming.   Labs were reviewed with the patient, and abnormal results were discussed with the patient.   The patient is able to verbalize their individual safety plan to this provider.   # It is recommended to the patient to continue psychiatric medications as prescribed, after discharge from the hospital.     # It is recommended to the patient to follow up with your outpatient psychiatric provider and PCP.   # It was discussed with the patient, the impact of alcohol, drugs, tobacco have been there overall psychiatric and medical wellbeing, and total abstinence from substance use was recommended the patient.ed.   # Prescriptions provided or sent directly to preferred pharmacy at discharge. Patient agreeable to plan. Given opportunity to ask questions. Appears to feel comfortable with discharge.    # In the event of worsening symptoms, the patient is instructed to call the crisis hotline, 911 and or go to the nearest ED for appropriate evaluation and treatment of symptoms. To follow-up with primary care provider for other medical issues, concerns and or health care needs   # Patient was discharged home with a plan to follow up as noted below.       On day of discharge patient reports that he is doing better.  He reports that his mood is good.  Reports that he slept well last night although  he had to wake up due to urinary urgency but that is normal for him.  Reports that he feels well rested this morning.  Reports that his appetite is excellent and he ate a large breakfast and was concerned that he had some extra salt in his breakfast which may affect his blood pressures.  Discussed with him that we will just repeat them in make sure he is stable for discharge.  Patient denies side effects to psychiatric medications and medications in general.  Had long discussion with patient about getting to his follow-up appointments and the importance of him following up with his PCP, cardiologist, and attending the substance use IOP given that his cocaine use has may affect on his health especially given he has had history of stroke and significant heart failure with a reduced ejection fraction.  Patient reported he did not access to his medications and we were agreeable to providing him a refill of all his medications after confirming that these were the doses he was recently dispensed and confirming by PCP and cardiology notes.  Patient denies suicidal thoughts, homicidal thoughts, and auditory visualizations prior to discharge.  He reports that his anxiety is at a stable level and the medications have been helpful to make him feel calm.  Total Time spent with patient: 30 minutes  Musculoskeletal: Strength & Muscle Tone: within normal limits Gait & Station: normal  Patient leans: N/A  Psychiatric Specialty Exam  Presentation  General Appearance:  Appropriate for Environment; Casual; Fairly Groomed  Eye Contact: Good  Speech: Normal Rate; Clear and Coherent  Speech Volume: Normal  Handedness: Right   Mood and Affect  Mood: Euthymic  Duration of Depression Symptoms: Greater than two weeks  Affect: Appropriate; Congruent; Full Range   Thought Process  Thought Processes: Linear  Descriptions of Associations:Intact  Orientation:Full (Time, Place and Person)  Thought  Content:Logical  History of Schizophrenia/Schizoaffective disorder:No  Duration of Psychotic Symptoms:No data recorded Hallucinations:Hallucinations: None  Ideas of Reference:None  Suicidal Thoughts:Suicidal Thoughts: No  Homicidal Thoughts:Homicidal Thoughts: No   Sensorium  Memory: Immediate Good; Recent Good; Remote Good  Judgment: Good  Insight: Good   Executive Functions  Concentration: Good  Attention Span: Good  Recall: Good  Fund of Knowledge: Good  Language: Good   Psychomotor Activity  Psychomotor Activity: Psychomotor Activity: Normal   Assets  Assets: Social Support; Housing; Desire for Improvement   Sleep  Sleep: Sleep: Fair Number of Hours of Sleep: 7.25   Physical Exam Vitals and nursing note reviewed.  HENT:     Head: Normocephalic and atraumatic.  Pulmonary:     Effort: Pulmonary effort is normal.  Skin:    Comments: 2 cm raised lesion on left posterior neck that is cystic in appearance and nonerythematous. To palpation feels cystic and is non tender. Skin is well intact and no evidence of bug bite or other disruption of skin. Appears more chronic and acute from bug bite. Exam unchanged during admission  Neurological:     General: No focal deficit present.     Mental Status: He is alert.      Review of Systems  Constitutional:  Negative for diaphoresis and fever.  Respiratory:  Negative for shortness of breath.   Cardiovascular:  Negative for chest pain, palpitations and orthopnea.  Gastrointestinal:  Negative for constipation, diarrhea, nausea and vomiting.  Genitourinary:        Positive for chronic urgency on lasix  Musculoskeletal:  Negative for neck pain.  Skin:        Itching from neck lesion  Neurological:  Negative for dizziness, tremors, weakness and headaches.  Psychiatric/Behavioral:  Negative for hallucinations and suicidal ideas.     Blood pressure 118/85, pulse 72, temperature 98.1 F (36.7 C),  temperature source Oral, resp. rate 16, height 5\' 9"  (1.753 m), weight 71.2 kg, SpO2 100%. Body mass index is 23.18 kg/m.   Mental Status Per Nursing Assessment::   On Admission:  Suicidal ideation indicated by patient  Demographic Factors:  Male, Divorced or widowed, Low socioeconomic status, Living alone, and Unemployed  Loss Factors: Decline in physical health  Historical Factors: NA  Risk Reduction Factors:   Religious beliefs about death and Positive therapeutic relationship  Continued Clinical Symptoms:  Mood is stable. Anxiety at a manageable level. Denying any SI including passive SI.   Cognitive Features That Contribute To Risk:  None    Suicide Risk:  Mild:  There are no identifiable suicide plans, no associated intent, mild dysphoria and related symptoms, good self-control (both objective and subjective assessment), few other risk factors, and identifiable protective factors, including available and accessible social support.    Follow-up Information     [facility] Follow up on 01/24/2024.   Why: You have a hospital follow up appointment for therapy and medication management services on  01/24/24 at 9:00 am.  The appointment will be Virtual telehealth. Contact information: [address]  [address] [place] [address] [phone]         Anders Simmonds, PA-C. Schedule an appointment as soon as possible for a visit.   Specialty: Family Medicine Why: Get in as soon as possible with Marylene Land or your cardiologist to adjust your blood pressure medications. Contact information: [address] [place] [address] [phone]         Duke Salvia, MD. Schedule an appointment as soon as possible for a visit.   Specialty: Cardiology Why: Get in as soon as possible with Dr. Graciela Husbands or your PCP to adjust your heart failure / blood pressure medications. Contact information: [address] [place] [address] [phone]          [facility] ELECTROPHYSIOLOGY LAB. Schedule an appointment as soon as possible for a visit.   Specialty: Electrophysiology Contact information: [address] [place] [address] [phone]        [facility] Go on 01/20/2024.   Specialty: Behavioral Health Why: You have an appointment on 01/20/24 at 1:00 pm for the substance abuse intensive outpatient program which meets in-person M, W, F from 9 am to 12 pm and runs for 8 to 12 weeks.  Clients can also receive individual and family therapy and MAT.  There is weekly drug testing.  The program is abstinence based and AA, NA, [facility] Recovery, etc. attendance is encouraged.  For questions, please call [phone]. Contact information: [address] [place] [address] [phone]                Plan Of Care/Follow-up recommendations:  -Make an appointment with your PCP, cardiologist, and the electrophysiologist to have your blood pressure, heart failure, and other chronic medical conditions managed.    -You have scheduled appointments for [facility] (psychiatrist/therapy, virtual appointment) and [facility] (substance use intensive outpatient program). Please note these times and have reminders to attend these important appointments.    -If your "bug bite" starts to become painful to touch or you develop a fever, immediately go to your PCP, urgent care, or emergency department as this has likely become an abscess and needs draining and possibly antibiotics.    -Pick up your new sertraline and gabapentin doses from [employer].    -Follow-up with your outpatient psychiatric provider (and therapist) -instructions on appointment date, time, and address (location) are provided to you in discharge paperwork.   -Take your psychiatric medications as prescribed at discharge - instructions are provided to you in the  discharge paperwork   -Follow-up with outpatient primary care doctor and other specialists -for management of preventative medicine and any chronic medical disease.   -Recommend abstinence from alcohol, tobacco, cannabis, and other substances at discharge.    -If your psychiatric symptoms recur, worsen, or if you have severe side effects to your psychiatric medications, call your outpatient psychiatric provider, 911, 988 (national suicide hotline), go to [facility], or go to the nearest emergency department.   -If suicidal thoughts occur, call your outpatient psychiatric provider, 911, 988 (national suicide hotline), go to [facility], or go to the nearest emergency department.   Matthew Dare, MD PGY-1 Psychiatry Resident 01/17/2024, 9:12 AM

## 2024-01-17 NOTE — Progress Notes (Signed)
   01/17/24 0900  Psych Admission Type (Psych Patients Only)  Admission Status Voluntary  Psychosocial Assessment  Patient Complaints None  Eye Contact Fair  Facial Expression Animated  Affect Appropriate to circumstance  Speech Logical/coherent  Interaction Assertive  Motor Activity Other (Comment) (WNL)  Appearance/Hygiene Unremarkable  Behavior Characteristics Cooperative  Mood Pleasant  Thought Process  Coherency WDL  Content WDL  Delusions None reported or observed  Perception WDL  Hallucination None reported or observed  Judgment Impaired  Confusion None  Danger to Self  Current suicidal ideation? Denies  Self-Injurious Behavior No self-injurious ideation or behavior indicators observed or expressed   Agreement Not to Harm Self Yes  Description of Agreement verbal  Danger to Others  Danger to Others None reported or observed

## 2024-01-17 NOTE — Group Note (Signed)
Recreation Therapy Group Note   Group Topic:Problem Solving  Group Date: 01/17/2024 Start Time: 0930 End Time: 1015 Facilitators: Zerrick Hanssen-McCall, LRT,CTRS Location: [address] Day[address]   Group Topic: Problem Solving  Goal Area(s) Addresses:  Patient will effectively work in a team with other group members. Patient will verbalize importance of using appropriate problem solving techniques.  Patient will identify positive change associated with effective problem solving skills.   Intervention: Worksheets, Music  Activity: Patients were given two sheets of brain teasers. Patients were to work through each puzzle to come up with the answer. Patients had the option of working together or individually.    Education: Problem solving, Use of skills post discharge  Education Outcome: Acknowledges understanding/In group clarification offered/Needs additional education.    Affect/Mood: Appropriate   Participation Level: Engaged   Participation Quality: Independent   Behavior: Appropriate   Speech/Thought Process: Focused   Insight: Good   Judgement: Good   Modes of Intervention: Music and Worksheet   Patient Response to Interventions:  Engaged   Education Outcome:  In group clarification offered    Clinical Observations/Individualized Feedback: Pt was appropriate and engaging during group session. Pt was focused but also had a sense of humor while participating in activity. Pt worked well with peers throughout group session.    Plan: Continue to engage patient in RT group sessions 2-3x/week.   Cloteal Isaacson-McCall, LRT,CTRS 01/17/2024 11:33 AM

## 2024-01-17 NOTE — Plan of Care (Signed)
  Problem: Education: Goal: Emotional status will improve Outcome: Progressing Goal: Verbalization of understanding the information provided will improve Outcome: Progressing   Problem: Activity: Goal: Interest or engagement in activities will improve Outcome: Progressing   Problem: Health Behavior/Discharge Planning: Goal: Identification of resources available to assist in meeting health care needs will improve Outcome: Progressing Goal: Compliance with treatment plan for underlying cause of condition will improve Outcome: Progressing

## 2024-01-17 NOTE — Care Management Important Message (Signed)
 Patient informed of right to appeal discharge, provided phone number to [facility]. Patient expressed no interest in appealing discharge at this time. CSW will continue to monitor situation.

## 2024-01-17 NOTE — Discharge Instructions (Addendum)
-  Make an appointment with your PCP, cardiologist, and the electrophysiologist to have your blood pressure, heart failure, and other chronic medical conditions managed.   -You have scheduled appointments for [facility] (psychiatrist/therapy, virtual appointment) and [facility] (substance use intensive outpatient program). Please note these times and have reminders to attend these important appointments.   -If your "bug bite" starts to become painful to touch or you develop a fever, immediately go to your PCP, urgent care, or emergency department as this has likely become an abscess and needs draining and possibly antibiotics.   -Pick up your new sertraline and gabapentin doses from [employer].   -Follow-up with your outpatient psychiatric provider (and therapist) -instructions on appointment date, time, and address (location) are provided to you in discharge paperwork.  -Take your psychiatric medications as prescribed at discharge - instructions are provided to you in the discharge paperwork  -Follow-up with outpatient primary care doctor and other specialists -for management of preventative medicine and any chronic medical disease.  -Recommend abstinence from alcohol, tobacco, cannabis, and other substances at discharge.   -If your psychiatric symptoms recur, worsen, or if you have severe side effects to your psychiatric medications, call your outpatient psychiatric provider, 911, 988 (national suicide hotline), go to [facility], or go to the nearest emergency department.  -If suicidal thoughts occur, call your outpatient psychiatric provider, 911, 988 (national suicide hotline), go to [facility], or go to the nearest emergency department.  Naloxone (Narcan) can help reverse an overdose when given to the victim quickly.  [facility] offers free naloxone kits and  instructions/training on its use.  Add naloxone to your first aid kit and you can help save a life.   Pick up your free kit at the following locations:   [place]:  [name], [address] [place] [address] [phone]) [facility] [address] [place] [address] [phone]) [facility] [address] [place] [address]  [place]: [facility] [address] [place] [address] [phone]) [facility] [address] [place] [address] [phone])

## 2024-01-17 NOTE — Discharge Summary (Addendum)
Physician Discharge Summary Note  Patient:  Matthew [name] is an 58 y.o., male MRN:  [id number] DOB:  Oct 10, 1966 Patient phone:  [phone] (home)  Patient address:   [address] [place] [address],  Total Time spent with patient: 30 minutes  Date of Admission:  01/14/2024 Date of Discharge: 01/17/2024   Matthew Steig. is a 58 y.o. male  with a past psychiatric history of MDD, GAD, stimulant use disorder (cocaine), hospitalizations in 2022 and 2023 for SI. Patient initially arrived to [facility] on 01/13/24 for panic attacks, and admitted to [facility] on 01/14/24 for stabilization of acute on chronic psychiatric conditions. PMHx is significant for HFrEF, HTN, T2DM, prior CVA, and CKD 3a.    HPI from admission:  Patient reports that he is presenting with worsening anxiety and describes that he has episodes in which his heart races, he has difficulty breathing, and he "feels very anxious."  He reports that they have gotten worse recently.  Regarding stressors in his life over the past several months he reports that he has had issues with his finances.  Recently his "payee" was unable to cover his electric bill, and he reports that he is "unsure" if the electricity is out, which triggered his anxiety more and led him to current presentation.  He reports that his anxiety/panic attacks are brought on by stressors in his life but also reports that he worries too much about different things.  He reports he has issues with becoming easily annoyed and irritable as a result of this anxiety.  He does report that he worries about when his next pack attack will be and reports that he can feel them coming on and this causing a lot of distress for him.  He denies any substances or medications that worsened this anxiety, but he does endorse using cocaine several times a month and that his last use was on Friday.  Reports that he cannot recall any medications that been helpful for anxiety.   When prompted on medications that he has been on the past, he does endorse having some benefit from hydroxyzine but cannot recall any other medications are helpful.   Regarding mood, he does report having "depression."  However when asking him specific symptoms of depression he denies having depressed mood, anhedonia, and past suicidal thoughts recently.  He reports that his sleep generally is very good and he goes to bed around 11 and wakes up whenever his "natural alarm" wakes him up.  He reports that his appetite is good.  He denies having low energy or issues with concentration.  When asked what he believes he is depressed, he does not give a specific answer.  Regarding manic episodes currently or in lifetime, he denies increased goal-directed activity and elevated/expansive/irritable mood.   Regarding other psychiatric review of systems, he denies psychotic symptoms currently or in his lifetime including hallucinations or paranoia including while on substances.  Patient endorses losing his wife as an emotional traumatic experience.  Reports that he has distressing memories of this experience and avoids thinking about this experience and things in her mind of this experience.  Denies experiencing nightmares related to the trauma.  He does report having physical symptoms when reminded of the trauma and that they are similar to his panic attacks.  He denies blaming himself for this experience.  He reports that he sees the world somewhat negatively as a result of this experience and has trouble experiencing positive emotions as  her self this experience.  He does report some detachment from others.  He does report feeling more irritable but denies experiencing any other hyperarousal symptoms.  He reports the symptoms have been going on for greater than 6 months.  He denies experiencing any dissociation related to his trauma.  Principal Problem: Panic disorder Discharge Diagnoses: Principal Problem:   Panic  disorder Active Problems:   Nicotine use disorder   GAD (generalized anxiety disorder)   Cocaine use disorder, severe, dependence (HCC)   Past Psychiatric History:  Current Psychiatrist: None, PCP prescribes sertraline Current Therapist: None Previous Psychiatric Diagnoses: MDD, GAD, stimulant use disorder (cocaine) Psychiatric Medications: Current Sertraline 100 mg once daily for MDD Past Trazodone, reasonably discontinued in past due to patient's heart failure Prozac, lack of efficacy per chart Seroquel 200, "slept good," lack of efficacy for chart  Psychiatric Hospitalization hx: 2022 and 2023 for SI Psychotherapy hx: No history Neuromodulation history: Denies History of suicide: Denies History of homicide or aggression: Denies  Past Medical History:  Past Medical History:  Diagnosis Date   Acute combined systolic and diastolic CHF, NYHA class 3 (HCC)    a. 02/2014 Echo: EF 25-30%.   Blindness of right eye    Cardiomyopathy (HCC)    a. 02/2014 Echo: EF 25-30%, sev glob HK with inferolat HK->AK, mod conc LVH, Gr 2 DD, Mild MR, sev dil LA.   CKD (chronic kidney disease), stage III (HCC)    Cocaine abuse (HCC)    Depression    DM (diabetes mellitus), type 2 (HCC)    GAD (generalized anxiety disorder)    HFrEF (heart failure with reduced ejection fraction) (HCC)    High cholesterol    Hypertension    Morbid obesity (HCC)    OSA (obstructive sleep apnea)    Stroke (HCC)    Tobacco abuse     Past Surgical History:  Procedure Laterality Date   CATARACT EXTRACTION Right    LEFT HEART CATHETERIZATION WITH CORONARY ANGIOGRAM N/A 03/04/2014   Procedure: LEFT HEART CATHETERIZATION WITH CORONARY ANGIOGRAM;  Surgeon: Kathleene Hazel, MD;  Location: [facility] CATH LAB;  Service: Cardiovascular;  Laterality: N/A;   TEE WITHOUT CARDIOVERSION N/A 02/04/2017   Procedure: TRANSESOPHAGEAL ECHOCARDIOGRAM (TEE);  Surgeon: Lars Masson, MD;  Location: [facility] ENDOSCOPY;  Service:  Cardiovascular;  Laterality: N/A;   Family History:  Family History  Problem Relation Age of Onset   Heart attack Father        died @ 71   Cervical cancer Mother        died @ 69   Medical: Cervical cancer in mother, heart attack in father at 36 Psychiatric Dx: Denies Suicide Hx: Denies Violence/Aggression: Denies Substance use: Denies   Social History:  Developmental: Did not describe except that both parents are deceased currently.  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 Marital Status / Relationships: in "somewhat" of relationship, does not clarify further Children: Denies Hotel manager: Denies Legal: Court date on 2/5 for unspecified reason Access to firearms: Denies Social History   Substance and Sexual Activity  Alcohol Use No     Social History   Substance and Sexual Activity  Drug Use Yes   Types: Cocaine    Social History   Socioeconomic History   Marital status: Widowed  Spouse name: Not on file   Number of children: 2   Years of education: 14   Highest education level: Not on file  Occupational History   Occupation: [employer]  Tobacco Use   Smoking status: Every Day    Types: Cigarettes   Smokeless tobacco: Former    Quit date: 11/14/2020  Vaping Use   Vaping status: Never Used  Substance and Sexual Activity   Alcohol use: No   Drug use: Yes    Types: Cocaine   Sexual activity: Yes  Other Topics Concern   Not on file  Social History Narrative   Lives with 2 young children in [place].  Wife died in 05-06-2020.  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   Left-handed   Caffeine: occasional tea   Social Drivers of Corporate investment banker Strain: High Risk (01/24/2021)   Overall  Financial Resource Strain (CARDIA)    Difficulty of Paying Living Expenses: Very hard  Food Insecurity: No Food Insecurity (01/14/2024)   Hunger Vital Sign    Worried About Running Out of Food in the Last Year: Never true    Ran Out of Food in the Last Year: Never true  Transportation Needs: No Transportation Needs (01/14/2024)   PRAPARE - Administrator, Civil Service (Medical): No    Lack of Transportation (Non-Medical): No  Physical Activity: Not on file  Stress: Stress Concern Present (01/24/2021)   [employer] of Occupational Health - Occupational Stress Questionnaire    Feeling of Stress : Very much  Social Connections: Unknown (05/01/2022)   [facility]   Social Network    Social Network: Not on file    Hospital course: Patients suicidal thoughts appeared secondary to panic attacks and worsening psychosocial functioning, as opposed to mood episode. Discussed starting busprione with patient but he opted for [place] to optimize his current medications, for which we split gabapentin from once at night to three times daily dosing and increased sertraline to 150. We also added hydroxyzine but lose dose to avoid excessive anticholinergic burden given hx of stroke. Pt did not have side effects to medications. Regarding pt's cocaine use, he had a return to use and given the implications of this on his medical health with heart failure and mental health with anxiety and panic attacks, we discussed doing substance use intensive outpatient at [facility], which patient was agreeable to. Of note, pt had elevated Bps (several times SBP >160, DBP >100) while in hospital but given his extensive HFrEF regimen, we defered any adjustments to PCP/cardiology; we trialed clonidine as needed but pt could not tolerated this dose due to dizziness. Nicotine cessation was also encouraged at discharge. He was also established with psychiatry and therapy at  [facility].   During the patient's hospitalization, patient had extensive initial psychiatric evaluation, and follow-up psychiatric evaluations every day.  Psychiatric diagnoses provided upon initial assessment:  Panic disorder   Nicotine use disorder   GAD (generalized anxiety disorder)   Cocaine use disorder, severe, dependence (HCC)  Patient's psychiatric medications were adjusted on admission:  -- Continue sertraline 100 mg once daily for panic disorder, GAD and increase to 125 once daily starting tomorrow morning -- Switch gabapentin from 300 mg once at night to 100 mg 3 times daily for anxiety, neuropathic pain             -- Start hydroxyzine 10 mg 3 times  daily as needed for anxiety  During the hospitalization, other adjustments were made to the patient's psychiatric medication regimen:  -- Increased sertraline to 150 mg once daily for GAD, panic disorder -- Continue gabapentin 100 mg 3 times daily for anxiety, neuropathic pain             -- Continue hydroxyzine 10 mg 3 times daily as needed for anxiety  Patient's care was discussed during the interdisciplinary team meeting every day during the hospitalization.  The patient is not having side effects to prescribed psychiatric medication.  Gradually, patient started adjusting to milieu. The patient was evaluated each day by a clinical provider to ascertain response to treatment. Improvement was noted by the patient's report of decreasing symptoms, improved sleep and appetite, affect, medication tolerance, behavior, and participation in unit programming.  Patient was asked each day to complete a self inventory noting mood, mental status, pain, new symptoms, anxiety and concerns.   Symptoms were reported as significantly decreased or resolved completely by discharge.  The patient reports that their mood is stable.  The patient denied having suicidal thoughts for more than 48 hours prior to discharge.  Patient denies having homicidal  thoughts.  Patient denies having auditory hallucinations.  Patient denies any visual hallucinations or other symptoms of psychosis.  The patient was motivated to continue taking medication with a goal of continued improvement in mental health.   The patient reports their target psychiatric symptoms of anxiety and panic attacks responded well to the psychiatric medications, and the patient reports overall benefit other psychiatric hospitalization. Supportive psychotherapy was provided to the patient. The patient also participated in regular group therapy while hospitalized. Coping skills, problem solving as well as relaxation therapies were also part of the unit programming.  Labs were reviewed with the patient, and abnormal results were discussed with the patient.  The patient is able to verbalize their individual safety plan to this provider.  # It is recommended to the patient to continue psychiatric medications as prescribed, after discharge from the hospital.    # It is recommended to the patient to follow up with your outpatient psychiatric provider and PCP.  # It was discussed with the patient, the impact of alcohol, drugs, tobacco have been there overall psychiatric and medical wellbeing, and total abstinence from substance use was recommended the patient.ed.  # Prescriptions provided or sent directly to preferred pharmacy at discharge. Patient agreeable to plan. Given opportunity to ask questions. Appears to feel comfortable with discharge.    # In the event of worsening symptoms, the patient is instructed to call the crisis hotline, 911 and or go to the nearest ED for appropriate evaluation and treatment of symptoms. To follow-up with primary care provider for other medical issues, concerns and or health care needs  # Patient was discharged home with a plan to follow up as noted below.    On day of discharge patient reports that he is doing better.  He reports that his mood is good.   Reports that he slept well last night although he had to wake up due to urinary urgency but that is normal for him.  Reports that he feels well rested this morning.  Reports that his appetite is excellent and he ate a large breakfast and was concerned that he had some extra salt in his breakfast which may affect his blood pressures.  Discussed with him that we will just repeat them in make sure he is stable for discharge.  Patient denies side effects to psychiatric medications and medications in general.  Had long discussion with patient about getting to his follow-up appointments and the importance of him following up with his PCP, cardiologist, and attending the substance use IOP given that his cocaine use has may affect on his health especially given he has had history of stroke and significant heart failure with a reduced ejection fraction.  Patient reported he did not access to his medications and we were agreeable to providing him a refill of all his medications after confirming that these were the doses he was recently dispensed and confirming by PCP and cardiology notes.  Patient denies suicidal thoughts, homicidal thoughts, and auditory visualizations prior to discharge.  He reports that his anxiety is at a stable level and the medications have been helpful to make him feel calm.   Physical Findings: AIMS: not indicated CIWA:  not indicated COWS:  not indicated  Musculoskeletal: Strength & Muscle Tone: within normal limits Gait & Station: normal Patient leans: N/A   Psychiatric Specialty Exam:  Presentation  General Appearance:  Appropriate for Environment  Eye Contact: Good  Speech: Normal Rate  Speech Volume: Normal  Handedness: Right   Mood and Affect  Mood: Euthymic  Affect: Appropriate; Congruent; Full Range   Thought Process  Thought Processes: Coherent; Linear  Descriptions of Associations:Intact  Orientation:Full (Time, Place and Person)  Thought  Content:Logical  History of Schizophrenia/Schizoaffective disorder:No  Duration of Psychotic Symptoms:No data recorded Hallucinations:Hallucinations: None  Ideas of Reference:None  Suicidal Thoughts:Suicidal Thoughts: No  Homicidal Thoughts:Homicidal Thoughts: No   Sensorium  Memory: Immediate Good; Recent Good; Remote Good  Judgment: Fair  Insight: Good   Executive Functions  Concentration: Good  Attention Span: Good  Recall: Good  Fund of Knowledge: Good  Language: Good   Psychomotor Activity  Psychomotor Activity: Psychomotor Activity: Normal   Assets  Assets: Social Support; Housing; Desire for Improvement   Sleep  Sleep: Sleep: Good Number of Hours of Sleep: 7.25    Physical Exam: Physical Exam Vitals and nursing note reviewed.  HENT:     Head: Normocephalic and atraumatic.  Pulmonary:     Effort: Pulmonary effort is normal.  Skin:    Comments: 2 cm raised lesion on left posterior neck that is cystic in appearance and nonerythematous. To palpation feels cystic and is non tender. Skin is well intact and no evidence of bug bite or other disruption of skin. Appears more chronic and acute from bug bite. Exam unchanged during admission  Neurological:     General: No focal deficit present.     Mental Status: He is alert.    Review of Systems  Constitutional:  Negative for diaphoresis and fever.  Respiratory:  Negative for shortness of breath.   Cardiovascular:  Negative for chest pain, palpitations and orthopnea.  Gastrointestinal:  Negative for constipation, diarrhea, nausea and vomiting.  Genitourinary:        Positive for chronic urgency on lasix  Musculoskeletal:  Negative for neck pain.  Skin:        Itching from neck lesion  Neurological:  Negative for dizziness, tremors, weakness and headaches.  Psychiatric/Behavioral:  Negative for hallucinations and suicidal ideas.    Blood pressure 118/85, pulse 72, temperature 98.1 F  (36.7 C), temperature source Oral, resp. rate 16, height 5\' 9"  (1.753 m), weight 71.2 kg, SpO2 100%. Body mass index is 23.18 kg/m.   Social History   Tobacco Use  Smoking Status Every Day   Types:  Cigarettes  Smokeless Tobacco Former   Quit date: 11/14/2020   Tobacco Cessation:  A prescription for an FDA-approved tobacco cessation medication provided at discharge   Blood Alcohol level:  Lab Results  Component Value Date   [facility] <10 01/13/2024   ETH <10 11/16/2023    Metabolic Disorder Labs:  Lab Results  Component Value Date   HGBA1C 6.2 (H) 11/17/2023   MPG 131.24 11/17/2023   MPG 111.15 12/27/2020   Lab Results  Component Value Date   PROLACTIN 4.8 02/14/2022   Lab Results  Component Value Date   CHOL 167 07/04/2023   TRIG 116 07/04/2023   HDL 41 07/04/2023   CHOLHDL 4.1 07/04/2023   VLDL 23 07/04/2023   LDLCALC 103 (H) 07/04/2023   LDLCALC 16 07/19/2022    See Psychiatric Specialty Exam and Suicide Risk Assessment completed by Attending Physician prior to discharge.  Discharge destination:  Home  Is patient on multiple antipsychotic therapies at discharge:  No   Has Patient had three or more failed trials of antipsychotic monotherapy by history:  No  Recommended Plan for Multiple Antipsychotic Therapies: NA  Discharge Instructions     Diet - low sodium heart healthy   Complete by: As directed    Increase activity slowly   Complete by: As directed       Allergies as of 01/17/2024   No Known Allergies      Medication List     TAKE these medications      Indication  aspirin EC 81 MG tablet Take 1 tablet (81 mg total) by mouth daily. Swallow whole.  Indication: Disease involving a Thrombosis or an Embolism   atorvastatin 80 MG tablet Commonly known as: LIPITOR Take 1 tablet (80 mg total) by mouth daily.  Indication: High Amount of Fats in the Blood   BiDil 20-37.5 MG tablet Generic drug: isosorbide-hydrALAZINE Take 2 tablets by mouth  3 (three) times daily.  Indication: Cardiac Failure   ezetimibe 10 MG tablet Commonly known as: ZETIA Take 1 tablet (10 mg total) by mouth daily.  Indication: High Amount of Fats in the Blood   furosemide 40 MG tablet Commonly known as: Lasix Take 1 tablet (40 mg total) by mouth daily.  Indication: Cardiac Failure   gabapentin 100 MG capsule Commonly known as: NEURONTIN Take 1 capsule (100 mg total) by mouth every 8 (eight) hours. What changed:  medication strength how much to take when to take this  Indication: Generalized Anxiety Disorder, Neuropathic Pain, Panic Disorder   hydrocortisone cream 1 % Apply topically 3 (three) times daily.  Indication: Skin Inflammation   hydrOXYzine 10 MG tablet Commonly known as: ATARAX Take 1 tablet (10 mg total) by mouth 3 (three) times daily as needed for itching or anxiety.  Indication: Feeling Anxious   losartan 100 MG tablet Commonly known as: COZAAR Take 1 tablet (100 mg total) by mouth daily.  Indication: High Blood Pressure   melatonin 5 MG Tabs Take 1 tablet (5 mg total) by mouth at bedtime.  Indication: Trouble Sleeping   metFORMIN 500 MG 24 hr tablet Commonly known as: GLUCOPHAGE-XR Take 1 tablet (500 mg total) by mouth daily with breakfast.  Indication: Type 2 Diabetes   nicotine 14 mg/24hr patch Commonly known as: NICODERM CQ - dosed in mg/24 hours Place 1 patch (14 mg total) onto the skin daily as needed (nicotine cravings).  Indication: Nicotine Addiction   sertraline 50 MG tablet Commonly known as: ZOLOFT Take 3 tablets (150 mg  total) by mouth daily. What changed:  medication strength how much to take  Indication: Generalized Anxiety Disorder, Panic Disorder   spironolactone 25 MG tablet Commonly known as: ALDACTONE Take 1 tablet (25 mg total) by mouth daily.  Indication: High Blood Pressure   timolol 0.5 % ophthalmic solution Commonly known as: TIMOPTIC Place 1 drop into both eyes daily after  breakfast.  Indication: Angle-Closure Glaucoma        Follow-up Information     [facility] Follow up on 01/24/2024.   Why: You have a hospital follow up appointment for therapy and medication management services on  01/24/24 at 9:00 am.  The appointment will be Virtual telehealth. Contact information: [address]  [address] [place] [address] [phone]         Anders Simmonds, [place]. Schedule an appointment as soon as possible for a visit.   Specialty: Family Medicine Why: Get in as soon as possible with Marylene Land or your cardiologist to adjust your blood pressure medications. Contact information: [address] [place] [address] [phone]         Duke Salvia, [place]. Schedule an appointment as soon as possible for a visit.   Specialty: Cardiology Why: Get in as soon as possible with Dr. Graciela Husbands or your PCP to adjust your heart failure / blood pressure medications. Contact information: [address] [place] [address] [phone]         [facility] ELECTROPHYSIOLOGY LAB. Schedule an appointment as soon as possible for a visit.   Specialty: Electrophysiology Contact information: [address] [place] [address] [phone]        [facility]. Go on 01/20/2024.   Specialty: Behavioral Health Why: You have an appointment on 01/20/24 at 1:00 pm for the substance abuse intensive outpatient program which meets in-person M, W, F from 9 am to 12 pm and runs for 8 to 12 weeks.  Clients can also receive individual and family therapy and MAT.  There is weekly drug testing.  The program is abstinence based and AA, NA, [facility] Recovery, etc. attendance is encouraged.  For questions, please call [phone]. Contact information: [address] [place] [address] [phone]                Follow-up recommendations:   -Make an appointment with your  PCP, cardiologist, and the electrophysiologist to have your blood pressure, heart failure, and other chronic medical conditions managed.   -You have scheduled appointments for [facility] (psychiatrist/therapy, virtual appointment) and [facility] (substance use intensive outpatient program). Please note these times and have reminders to attend these important appointments.   -If your "bug bite" starts to become painful to touch or you develop a fever, immediately go to your PCP, urgent care, or emergency department as this has likely become an abscess and needs draining and possibly antibiotics.   -Pick up your new sertraline and gabapentin doses from [employer].   -Follow-up with your outpatient psychiatric provider (and therapist) -instructions on appointment date, time, and address (location) are provided to you in discharge paperwork.  -Take your psychiatric medications as prescribed at discharge - instructions are provided to you in the discharge paperwork  -Follow-up with outpatient primary care doctor and other specialists -for management of preventative medicine and any chronic medical disease.  -Recommend abstinence from alcohol, tobacco, cannabis, and other substances at discharge.   -If your  psychiatric symptoms recur, worsen, or if you have severe side effects to your psychiatric medications, call your outpatient psychiatric provider, 911, 988 (national suicide hotline), go to [facility], or go to the nearest emergency department.  -If suicidal thoughts occur, call your outpatient psychiatric provider, 911, 988 (national suicide hotline), go to [facility], or go to the nearest emergency department.   Meryl Dare, MD PGY-1 Psychiatry Resident 01/17/2024, 9:09 AM

## 2024-01-17 NOTE — Progress Notes (Signed)
  [facility] Adult Case Management Discharge Plan :  Will you be returning to the same living situation after discharge:  Yes,  pt will be returning home at discharge . At discharge, do you have transportation home?: Yes,  CSW arranged [employer] for 11:15AM Do you have the ability to pay for your medications: Yes,  Pt has active Medicare health insurance  Release of information consent forms completed and in the chart;  Patient's signature needed at discharge.  Patient to Follow up at:  Follow-up Information     [facility] Follow up on 01/24/2024.   Why: You have a hospital follow up appointment for therapy and medication management services on  01/24/24 at 9:00 am.  The appointment will be Virtual telehealth. Contact information: [address]  [address] [place] [address] [phone]         Anders Simmonds, [place]. Schedule an appointment as soon as possible for a visit.   Specialty: Family Medicine Why: Please call this provider on 01/21/24 at 9:00AM to schedule an appt as soon as possible with Marylene Land or your cardiologist to adjust your blood pressure medications. Contact information: [address] [place] [address] [phone]         Duke Salvia, MD. Schedule an appointment as soon as possible for a visit.   Specialty: Cardiology Why: Please call this provider on 01/22/24 at 10:00AM to schedule an appointment as soon as possible with Dr. Graciela Husbands or your PCP to adjust your heart failure / blood pressure medications. Contact information: [address] [place] [address] [phone]         [facility] ELECTROPHYSIOLOGY LAB. Schedule an appointment as soon as possible for a visit.   Specialty: Electrophysiology Why: Please call this provider on 01/22/24 at 1:00PM to schedule an appt. Contact information: [address] [place] [address] [phone]        [facility]. Go on 01/20/2024.   Specialty: Behavioral Health Why: You have an appointment on 01/20/24 at 1:00 pm for the substance abuse intensive outpatient program which meets in-person M, W, F from 9 am to 12 pm and runs for 8 to 12 weeks.  Clients can also receive individual and family therapy and MAT.  There is weekly drug testing.  The program is abstinence based and AA, NA, [facility] Recovery, etc. attendance is encouraged.  For questions, please call [phone]. Contact information: [address] [place] [address] [phone]                Next level of care provider has access to [facility] Link:yes  Safety Planning and Suicide Prevention discussed: No. Pt declined consents     Has patient been referred to the Quitline?: Patient does not use tobacco/nicotine products per patient report this morning "I don't use that stuff"  Patient has been referred for addiction treatment: Pt will follow up with [facility] IOP through [facility] starting on 01/20/24 for substance use treatment   Kathi Der, LCSWA 01/17/2024, 10:15 AM

## 2024-01-17 NOTE — BHH Group Notes (Signed)
Spiritual care group facilitated by Chaplain Dyanne Carrel, [facility]  Group focused on topic of strength. Group members reflected on what thoughts and feelings emerge when they hear this topic. They then engaged in facilitated dialog around how strength is present in their lives. This dialog focused on representing what strength had been to them in their lives (images and patterns given) and what they saw as helpful in their life now (what they needed / wanted).  Activity drew on narrative framework.  Patient Progress: Matthew [name] attended group and actively engaged and participated in group conversation and activities.

## 2024-01-17 NOTE — Transportation (Signed)
01/17/2024  Matthew [name]. DOB: 1966/07/04 MRN: [id number]   RIDER WAIVER AND RELEASE OF LIABILITY  For the purposes of helping with transportation needs, [facility] partners with outside transportation providers (taxi companies, [employer], Catering manager.) to give [employer] patients or other approved people the choice of on-demand rides [employer]") to our buildings for non-emergency visits.  By using [employer], I, the person signing this document, on behalf of myself and/or any legal minors (in my care using the [employer]), agree:  Science writer given to me are supplied by independent, outside transportation providers who do not work for, or have any affiliation with, [employer]. [facility] is not a transportation company. [place] has no control over the quality or safety of the rides I get using [employer]. [place] has no control over whether any outside ride will happen on time or not. [place] gives no guarantee on the reliability, quality, safety, or availability on any rides, or that no mistakes will happen. I know and accept that traveling by vehicle (car, truck, SVU, Zenaida Niece, bus, taxi, etc.) has risks of serious injuries such as disability, being paralyzed, and death. I know and agree the risk of using [employer] is mine alone, and not [employer]. Transport Services are provided "as is" and as are available. The transportation providers are in charge for all inspections and care of the vehicles used to provide these rides. I agree not to take legal action against [facility], its agents, employees, officers, directors, representatives, insurers, attorneys, assigns, successors, subsidiaries, and affiliates at any time for any reasons related directly or indirectly to using [employer]. I also agree not to take legal action against [employer] or its affiliates for any injury, death, or damage to property caused by or related to  using [employer]. I have read this Waiver and Release of Liability, and I understand the terms used in it and their legal meaning. This Waiver is freely and voluntarily given with the understanding that my right (or any legal minors) to legal action against  relating to [employer] is knowingly given up to use these services.   I attest that I read the Ride Waiver and Release of Liability to Matthew [name]., gave [name] the opportunity to ask questions and answered the questions asked (if any). I affirm that Matthew [name]. then provided consent for assistance with transportation.

## 2024-01-17 NOTE — Progress Notes (Signed)
Discharge Note:  Patient denies SI/HI/AVH at this time. Discharge instructions, AVS, prescriptions, and transition record gone over with patient. Patient agrees to comply with medication management, follow-up visit, and outpatient therapy. Patient belongings returned to patient. Patient questions and concerns addressed and answered. Patient ambulatory off unit. Patient discharged to home.

## 2024-01-22 ENCOUNTER — Other Ambulatory Visit: Payer: Self-pay

## 2024-01-22 ENCOUNTER — Ambulatory Visit: Payer: Medicaid Other | Attending: Physician Assistant | Admitting: Physician Assistant

## 2024-01-22 VITALS — BP 160/65 | HR 73 | Ht 69.0 in | Wt 170.0 lb

## 2024-01-22 DIAGNOSIS — F419 Anxiety disorder, unspecified: Secondary | ICD-10-CM

## 2024-01-22 DIAGNOSIS — Z7984 Long term (current) use of oral hypoglycemic drugs: Secondary | ICD-10-CM

## 2024-01-22 DIAGNOSIS — Z09 Encounter for follow-up examination after completed treatment for conditions other than malignant neoplasm: Secondary | ICD-10-CM

## 2024-01-22 DIAGNOSIS — F32A Depression, unspecified: Secondary | ICD-10-CM

## 2024-01-22 DIAGNOSIS — I5042 Chronic combined systolic (congestive) and diastolic (congestive) heart failure: Secondary | ICD-10-CM

## 2024-01-22 DIAGNOSIS — F1990 Other psychoactive substance use, unspecified, uncomplicated: Secondary | ICD-10-CM

## 2024-01-22 DIAGNOSIS — R591 Generalized enlarged lymph nodes: Secondary | ICD-10-CM

## 2024-01-22 DIAGNOSIS — G4733 Obstructive sleep apnea (adult) (pediatric): Secondary | ICD-10-CM

## 2024-01-22 DIAGNOSIS — E1121 Type 2 diabetes mellitus with diabetic nephropathy: Secondary | ICD-10-CM

## 2024-01-22 DIAGNOSIS — I110 Hypertensive heart disease with heart failure: Secondary | ICD-10-CM

## 2024-01-22 DIAGNOSIS — I509 Heart failure, unspecified: Secondary | ICD-10-CM

## 2024-01-22 DIAGNOSIS — E1169 Type 2 diabetes mellitus with other specified complication: Secondary | ICD-10-CM

## 2024-01-22 DIAGNOSIS — Z8673 Personal history of transient ischemic attack (TIA), and cerebral infarction without residual deficits: Secondary | ICD-10-CM

## 2024-01-22 DIAGNOSIS — E785 Hyperlipidemia, unspecified: Secondary | ICD-10-CM

## 2024-01-22 LAB — GLUCOSE, POCT (MANUAL RESULT ENTRY): POC Glucose: 131 mg/dL — AB (ref 70–99)

## 2024-01-22 MED ORDER — EZETIMIBE 10 MG PO TABS
10.0000 mg | ORAL_TABLET | Freq: Every day | ORAL | 0 refills | Status: DC
  Filled 2024-01-22: qty 30, 30d supply, fill #0

## 2024-01-22 MED ORDER — ATORVASTATIN CALCIUM 80 MG PO TABS
80.0000 mg | ORAL_TABLET | Freq: Every day | ORAL | 0 refills | Status: DC
  Filled 2024-01-22: qty 30, 30d supply, fill #0

## 2024-01-22 MED ORDER — SPIRONOLACTONE 25 MG PO TABS
25.0000 mg | ORAL_TABLET | Freq: Every day | ORAL | 3 refills | Status: DC
  Filled 2024-01-22: qty 30, 30d supply, fill #0

## 2024-01-22 MED ORDER — SERTRALINE HCL 50 MG PO TABS: 150.0000 mg | ORAL_TABLET | Freq: Every day | ORAL | 0 refills | Status: DC

## 2024-01-22 MED ORDER — BIDIL 20-37.5 MG PO TABS: 2.0000 | ORAL_TABLET | Freq: Three times a day (TID) | ORAL | 0 refills | Status: AC

## 2024-01-22 MED ORDER — FUROSEMIDE 40 MG PO TABS
40.0000 mg | ORAL_TABLET | Freq: Every day | ORAL | 3 refills | Status: DC
  Filled 2024-01-22 – 2024-05-18 (×3): qty 30, 30d supply, fill #0

## 2024-01-22 MED ORDER — LOSARTAN POTASSIUM 100 MG PO TABS
100.0000 mg | ORAL_TABLET | Freq: Every day | ORAL | 3 refills | Status: DC
  Filled 2024-01-22: qty 30, 30d supply, fill #0

## 2024-01-22 MED ORDER — METFORMIN HCL ER 500 MG PO TB24
500.0000 mg | ORAL_TABLET | Freq: Every day | ORAL | 3 refills | Status: DC
  Filled 2024-01-22: qty 30, 30d supply, fill #0

## 2024-01-22 MED ORDER — GABAPENTIN 100 MG PO CAPS: 100.0000 mg | ORAL_CAPSULE | Freq: Three times a day (TID) | ORAL | 3 refills | Status: DC

## 2024-01-22 NOTE — Progress Notes (Signed)
 Patient ID: Matthew [name]., male   DOB: March 27, 1966, 58 y.o.   MRN: [id number]    Matthew [name], is a 58 y.o. male  [id number]:[id number]  [id number]  DOB - Nov 08, 1966  Chief Complaint  Patient presents with   Hospitalization Follow-up    Hospitalization f/u.  Bump on neck and back of head X1 week         Subjective:   Matthew [name] is a 58 y.o. male here today for a follow up visit BH hospitalizATION from 1/28-1/31/2025.  He denies SI/HI today.  He is trying to get it together.  Denies drug use since discharge.  He has to call to schedule his BH appt.  He has the information for that.  Needs RF on all his med bc will run out soon.  No visual or auditory hallucinations.  Knot on the back of his neck.  He says they gave him cream for it and it is improving but still there.  No fever/ST/dysphagia.  Wants to get new sleep study done so he can start using CPAP machine again.    From discharge summary: Matthew [name]. is a 58 y.o. male  with a past psychiatric history of MDD, GAD, stimulant use disorder (cocaine), hospitalizations in 2022 and 2023 for SI. Patient initially arrived to [facility] on 01/13/24 for panic attacks, and admitted to [facility] on 01/14/24 for stabilization of acute on chronic psychiatric conditions. PMHx is significant for HFrEF, HTN, T2DM, prior CVA, and CKD 3a.    HPI from admission:  Patient reports that he is presenting with worsening anxiety and describes that he has episodes in which his heart races, he has difficulty breathing, and he feels very anxious.  He reports that they have gotten worse recently.  Regarding stressors in his life over the past several months he reports that he has had issues with his finances.  Recently his payee was unable to cover his electric bill, and he reports that he is unsure if the electricity is out, which triggered his anxiety more and led him to current presentation.  He reports that his anxiety/panic attacks are brought  on by stressors in his life but also reports that he worries too much about different things.  He reports he has issues with becoming easily annoyed and irritable as a result of this anxiety.  He does report that he worries about when his next pack attack will be and reports that he can feel them coming on and this causing a lot of distress for him.  He denies any substances or medications that worsened this anxiety, but he does endorse using cocaine several times a month and that his last use was on Friday.  Reports that he cannot recall any medications that been helpful for anxiety.  When prompted on medications that he has been on the past, he does endorse having some benefit from hydroxyzine  but cannot recall any other medications are helpful.   Regarding mood, he does report having depression.  However when asking him specific symptoms of depression he denies having depressed mood, anhedonia, and past suicidal thoughts recently.  He reports that his sleep generally is very good and he goes to bed around 11 and wakes up whenever his natural alarm wakes him up.  He reports that his appetite is good.  He denies having low energy or issues with concentration.  When asked what he believes he is depressed, he does not give a specific answer.  Regarding manic  episodes currently or in lifetime, he denies increased goal-directed activity and elevated/expansive/irritable mood.   Regarding other psychiatric review of systems, he denies psychotic symptoms currently or in his lifetime including hallucinations or paranoia including while on substances.  Patient endorses losing his wife as an emotional traumatic experience.  Reports that he has distressing memories of this experience and avoids thinking about this experience and things in her mind of this experience.  Denies experiencing nightmares related to the trauma.  He does report having physical symptoms when reminded of the trauma and that they are similar to  his panic attacks.  He denies blaming himself for this experience.  He reports that he sees the world somewhat negatively as a result of this experience and has trouble experiencing positive emotions as her self this experience.  He does report some detachment from others.  He does report feeling more irritable but denies experiencing any other hyperarousal symptoms.  He reports the symptoms have been going on for greater than 6 months.  He denies experiencing any dissociation related to his trauma.   Principal Problem: Panic disorder Discharge Diagnoses: Principal Problem:   Panic disorder Active Problems:   Nicotine  use disorder   GAD (generalized anxiety disorder)   Cocaine use disorder, severe, dependence (HCC)  Psychiatric diagnoses provided upon initial assessment:  Panic disorder   Nicotine  use disorder   GAD (generalized anxiety disorder)   Cocaine use disorder, severe, dependence (HCC)   Patient's psychiatric medications were adjusted on admission:  -- Continue sertraline  100 mg once daily for panic disorder, GAD and increase to 125 once daily starting tomorrow morning -- Switch gabapentin  from 300 mg once at night to 100 mg 3 times daily for anxiety, neuropathic pain             -- Start hydroxyzine  10 mg 3 times daily as needed for anxiety   During the hospitalization, other adjustments were made to the patient's psychiatric medication regimen:  -- Increased sertraline  to 150 mg once daily for GAD, panic disorder -- Continue gabapentin  100 mg 3 times daily for anxiety, neuropathic pain             -- Continue hydroxyzine  10 mg 3 times daily as needed for anxiety   Patient's care was discussed during the interdisciplinary team meeting every day during the hospitalization.   The patient is not having side effects to prescribed psychiatric medication.   Gradually, patient started adjusting to milieu. The patient was evaluated each day by a clinical provider to ascertain response  to treatment. Improvement was noted by the patient's report of decreasing symptoms, improved sleep and appetite, affect, medication tolerance, behavior, and participation in unit programming.  Patient was asked each day to complete a self inventory noting mood, mental status, pain, new symptoms, anxiety and concerns.   Symptoms were reported as significantly decreased or resolved completely by discharge.  The patient reports that their mood is stable.  The patient denied having suicidal thoughts for more than 48 hours prior to discharge.  Patient denies having homicidal thoughts.  Patient denies having auditory hallucinations.  Patient denies any visual hallucinations or other symptoms of psychosis.  The patient was motivated to continue taking medication with a goal of continued improvement in mental health.    The patient reports their target psychiatric symptoms of anxiety and panic attacks responded well to the psychiatric medications, and the patient reports overall benefit other psychiatric hospitalization. Supportive psychotherapy was provided to the patient. The patient also  participated in regular group therapy while hospitalized. Coping skills, problem solving as well as relaxation therapies were also part of the unit programming.   Labs were reviewed with the patient, and abnormal results were discussed with the patient.   The patient is able to verbalize their individual safety plan to this provider.   # It is recommended to the patient to continue psychiatric medications as prescribed, after discharge from the hospital.     # It is recommended to the patient to follow up with your outpatient psychiatric provider and PCP.   # It was discussed with the patient, the impact of alcohol, drugs, tobacco have been there overall psychiatric and medical wellbeing, and total abstinence from substance use was recommended the patient.ed.   # Prescriptions provided or sent directly to preferred  pharmacy at discharge. Patient agreeable to plan. Given opportunity to ask questions. Appears to feel comfortable with discharge.    # In the event of worsening symptoms, the patient is instructed to call the crisis hotline, 911 and or go to the nearest ED for appropriate evaluation and treatment of symptoms. To follow-up with primary care provider for other medical issues, concerns and or health care needs   # Patient was discharged home with a plan to follow up as noted below. No problems updated.  ALLERGIES: No Known Allergies  PAST MEDICAL HISTORY: Past Medical History:  Diagnosis Date   Acute combined systolic and diastolic CHF, NYHA class 3 (HCC)    a. 02/2014 Echo: EF 25-30%.   Blindness of right eye    Cardiomyopathy (HCC)    a. 02/2014 Echo: EF 25-30%, sev glob HK with inferolat HK->AK, mod conc LVH, Gr 2 DD, Mild MR, sev dil LA.   CKD (chronic kidney disease), stage III (HCC)    Cocaine abuse (HCC)    Depression    DM (diabetes mellitus), type 2 (HCC)    GAD (generalized anxiety disorder)    HFrEF (heart failure with reduced ejection fraction) (HCC)    High cholesterol    Hypertension    Morbid obesity (HCC)    OSA (obstructive sleep apnea)    Stroke (HCC)    Tobacco abuse     MEDICATIONS AT HOME: Prior to Admission medications   Medication Sig Start Date End Date Taking? Authorizing Provider  aspirin  EC 81 MG tablet Take 1 tablet (81 mg total) by mouth daily. Swallow whole. 01/17/24 02/16/24 Yes Massengill, Rankin, MD  hydrocortisone  cream 1 % Apply topically 3 (three) times daily. 01/17/24  Yes Massengill, Rankin, MD  hydrOXYzine  (ATARAX ) 10 MG tablet Take 1 tablet (10 mg total) by mouth 3 (three) times daily as needed for itching or anxiety. 01/17/24  Yes Massengill, Rankin, MD  melatonin 5 MG TABS Take 1 tablet (5 mg total) by mouth at bedtime. 01/17/24  Yes Massengill, Rankin, MD  nicotine  (NICODERM CQ  - DOSED IN MG/24 HOURS) 14 mg/24hr patch Place 1 patch (14 mg total)  onto the skin daily as needed (nicotine  cravings). 01/17/24  Yes Massengill, Rankin, MD  timolol  (TIMOPTIC ) 0.5 % ophthalmic solution Place 1 drop into both eyes daily after breakfast. 01/17/24  Yes Massengill, Rankin, MD  atorvastatin  (LIPITOR ) 80 MG tablet Take 1 tablet (80 mg total) by mouth daily. 01/22/24 02/21/24  Danton Jon HERO, PA-C  BIDIL  20-37.5 MG tablet Take 2 tablets by mouth 3 (three) times daily. 01/22/24 02/21/24  Danton Jon HERO, PA-C  ezetimibe  (ZETIA ) 10 MG tablet Take 1 tablet (10 mg total) by mouth  daily. 01/22/24 02/21/24  Danton Jon HERO, PA-C  furosemide  (LASIX ) 40 MG tablet Take 1 tablet (40 mg total) by mouth daily. 01/22/24 02/21/24  Danton Jon HERO, PA-C  gabapentin  (NEURONTIN ) 100 MG capsule Take 1 capsule (100 mg total) by mouth every 8 (eight) hours. 01/22/24   Danton Jon HERO, PA-C  losartan  (COZAAR ) 100 MG tablet Take 1 tablet (100 mg total) by mouth daily. 01/22/24 02/21/24  Danton Jon HERO, PA-C  metFORMIN  (GLUCOPHAGE -XR) 500 MG 24 hr tablet Take 1 tablet (500 mg total) by mouth daily with breakfast. 01/22/24 02/21/24  Danton Jon HERO, PA-C  sertraline  (ZOLOFT ) 50 MG tablet Take 3 tablets (150 mg total) by mouth daily. 01/22/24   Danton Jon HERO, PA-C  spironolactone  (ALDACTONE ) 25 MG tablet Take 1 tablet (25 mg total) by mouth daily. 01/22/24 02/21/24  Danton Jon HERO, PA-C  lovastatin  (MEVACOR ) 20 MG tablet Take 2 tablets (40 mg total) by mouth at bedtime. 06/24/14 08/26/14  Colton Drape, MD    ROS: Neg HEENT Neg resp Neg cardiac Neg GI Neg GU Neg MS Neg psych Neg neuro  Objective:   Vitals:   01/22/24 1418  BP: (!) 160/65  Pulse: 73  TempSrc: Oral  SpO2: 99%  Weight: 170 lb (77.1 kg)  Height: 5' 9 (1.753 m)   Exam General appearance : Awake, alert, not in any distress. Speech Clear. Not toxic looking HEENT: Atraumatic and Normocephalic, throat WNL.  No AC nodes.  +L occipital node that is about 1cm and mildly tender.  Mobile.  Well-demarcated.  Neck:  Supple, no JVD. No cervical lymphadenopathy.  Chest: Good air entry bilaterally, CTAB.  No rales/rhonchi/wheezing CVS: S1 S2 regular, no murmurs.  Extremities: B/L Lower Ext shows no edema, both legs are warm to touch Neurology: Awake alert, and oriented X 3, CN II-XII intact, Non focal Skin: No Rash  Data Review Lab Results  Component Value Date   HGBA1C 6.2 (H) 11/17/2023   HGBA1C 6.1 11/04/2023   HGBA1C 6.5 (A) 06/27/2022    Assessment & Plan   1. Hospital discharge follow-up (Primary) High risk readmission given all his co morbidities and drug use history and BH.   2. Controlled type 2 diabetes mellitus with diabetic nephropathy, without long-term current use of insulin  (HCC) - Glucose (CBG) - metFORMIN  (GLUCOPHAGE -XR) 500 MG 24 hr tablet; Take 1 tablet (500 mg total) by mouth daily with breakfast.  Dispense: 30 tablet; Refill: 3  3. Hyperlipidemia associated with type 2 diabetes mellitus (HCC) - atorvastatin  (LIPITOR ) 80 MG tablet; Take 1 tablet (80 mg total) by mouth daily.  Dispense: 30 tablet; Refill: 0 - ezetimibe  (ZETIA ) 10 MG tablet; Take 1 tablet (10 mg total) by mouth daily.  Dispense: 30 tablet; Refill: 0  4. Chronic congestive heart failure, unspecified heart failure type (HCC) - furosemide  (LASIX ) 40 MG tablet; Take 1 tablet (40 mg total) by mouth daily.  Dispense: 30 tablet; Refill: 3  5. Hypertensive heart disease with acute on chronic combined systolic and diastolic congestive heart failure (HCC) - BIDIL  20-37.5 MG tablet; Take 2 tablets by mouth 3 (three) times daily.  Dispense: 180 tablet; Refill: 0 - losartan  (COZAAR ) 100 MG tablet; Take 1 tablet (100 mg total) by mouth daily.  Dispense: 30 tablet; Refill: 3 - spironolactone  (ALDACTONE ) 25 MG tablet; Take 1 tablet (25 mg total) by mouth daily.  Dispense: 30 tablet; Refill: 3  6. Anxiety and depression Follow up with BH - gabapentin  (NEURONTIN ) 100 MG capsule; Take 1 capsule (100  mg total) by mouth every 8  (eight) hours.  Dispense: 90 capsule; Refill: 3 - sertraline  (ZOLOFT ) 50 MG tablet; Take 3 tablets (150 mg total) by mouth daily.  Dispense: 90 tablet; Refill: 0  7. Substance use disorder I have counseled the patient at length about substance abuse and addiction.  12 step meetings/recovery recommended.  Local 12 step meeting lists were given and attendance was encouraged.  Patient expresses understanding.  [place]org is the website for narcotics anonymous [employer].com.cy (website) or (458)414-2020 is the information for alcoholics anonymous Both are free and immediately available for help with alcohol and drug use    8. Chronic combined systolic and diastolic congestive heart failure (HCC) Has cardiology follow up 2/24 and he is aware - BIDIL  20-37.5 MG tablet; Take 2 tablets by mouth 3 (three) times daily.  Dispense: 180 tablet; Refill: 0 - furosemide  (LASIX ) 40 MG tablet; Take 1 tablet (40 mg total) by mouth daily.  Dispense: 30 tablet; Refill: 3 - spironolactone  (ALDACTONE ) 25 MG tablet; Take 1 tablet (25 mg total) by mouth daily.  Dispense: 30 tablet; Refill: 3  9. History of stroke  10. OSA (obstructive sleep apnea) - [facility] Sleep Study; Future  11. Lymphadenopathy -occipital X 1 -CBC  Return in about 3 months (around 04/20/2024) for PCP for chronic conditions-newin.  The patient was given clear instructions to go to ER or return to medical center if symptoms don't improve, worsen or new problems develop. The patient verbalized understanding. The patient was told to call to get lab results if they haven't heard anything in the next week.      Jon Moores, PA-C [facility], [place] [phone]   01/22/2024, 2:40 PM

## 2024-01-22 NOTE — Patient Instructions (Addendum)
[place].org is the website for narcotics anonymous [employer]cy (website) or 7190597396 is the information for alcoholics anonymous Both are free and immediately available for help with alcohol and drug use  [facility] [address] [place], [place] [address] [phone] or [phone] Walk-in urgent care 24/7 for anyone  For [facility] ONLY New patient assessments and therapy walk-ins: Monday and Wednesday 8am-11am First and second Friday 1pm-5pm New patient psychiatry and medication management walk-ins: Mondays, Wednesdays, Thursdays, Fridays 8am-11am No psychiatry walk-ins on Tuesday

## 2024-01-23 ENCOUNTER — Other Ambulatory Visit: Payer: Self-pay

## 2024-01-28 ENCOUNTER — Other Ambulatory Visit: Payer: Self-pay

## 2024-02-07 ENCOUNTER — Telehealth (HOSPITAL_COMMUNITY): Payer: Self-pay

## 2024-02-07 NOTE — Progress Notes (Incomplete)
Advanced Heart Failure Clinic Note    PCP: [name], MD HF Cardiologist: Dr. Shirlee Latch  Reason for Visit: F/u for chronic systolic heart failure   HPI: Matthew Petersonis a 58 y.o. with PMH significant for combined systolic CHF with biventricular failure, T2DM, HTN, OSA, tobacco use, HLD, cocaine use, poor medication adherence, depression,  CVA, and CKD 3a.     He was first admitted with HF in 2015 in the setting of long standing uncontrolled hypertension and smoking.  Had a LHC with normal cors.    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  Was not followed by cardiology or hospitalized for CHF during this time period.     March 2021 he began having CHF exacerbations after running out of meds between admissions.  Left AMA in March. Another similar admission in July, he was out of his medications at that time as well.     Admitted 12/2020 with CHF exacerbation after running out of his medications.  He was diuresed, put on an excellent medication regimen. He also endorsed suicidal ideation at that time and was subsequently admitted to behavioral health. UDS was positive for cocaine, echo showed EF < 20% with moderate RV dysfunction.   He was seen in the HF clinic 02/08/21 and lasix was cut back to 20 mg daily, later Lasix was stopped.   Echo 5/22 with EF improved to 50%.   Follow up 6/22, Matthew [name] was stopped due to dizziness.  Follow up 9/22 and was fluid overloaded and SOB. Had ran out of all of his meds, including lasix ~3 months prior. ReDs 46%. Given Lasix 80 mg IV x 1 in the office, GDMT adjusted. Instructed to f/u in 1 week but no showed to the appt.   Follow up 11/22 he had been off several medications, more SOB, continued to use cocaine. Mediations restarted, pillbox filled. Seen back 12/22, stable NYHA III, volume OK ReDs 35%. Matthew [name] restarted and advised 4 week follow up. No show x 2 appts.   Echo in 1/23 with EF  30-35%, mild LVH, normal RV.   Admitted 3/23 with SI and auditory hallucinations.   Today he returns for AHF follow up. Overall feeling pretty good. Denies palpitations, CP, dizziness, edema, or PND/Orthopnea. SOB only with exertion. Appetite great. No fever or chills. Weight at home 178 pounds. Taking all medications except he has been out of coreg and bidil for a while. Denies ETOH, smoking or drug use. Needs sleep study arranged.  Housing stable.   ECG (personally reviewed from 8/24): NSR 80  Labs (5/22): K 3.9, creatinine 1.6 Labs (6/22): K 3.8, creatinine 1.49 Labs (8/22): K 4.6 creatinine 1.25 Labs (9/22): K 4.1, creatinine 1.29, LDL 40  Labs (11/22): K 3.7 creatinine 1.91 Labs (3/23): K 4.2, creatinine 1.2, LDL 101, HDL 54 Labs (5/23): K 3.6, creatinine 1.23 Labs (8/23): K 4.4, SCr 1.4 Labs (8/24): K 3.8, SCr 1.46  PMH: 1. HTN 2. Type 2 diabetes 3. H/o cocaine abuse: Positive UDS 1/22.  4. CVA 2018 5. OSA: Uses CPAP.  6. Chronic systolic CHF: Nonischemic cardiomyopathy.  - LHC (2015): Normal coronaries.  - Echo (2021): EF 40-45%. - Echo (1/22): EF <20%, moderate LV dilation, moderately decreased RV systolic function, severe LAE.  - Echo (5/22): EF 50%, moderate LV dilation, normal RV.  - Echo (1/23): EF 30-35%, moderately decreased LV, normal RV, ascending aorta 39 mm - Echo today: read pending 7. Depression 8.  CKD stage 3 9. Hyperlipidemia  Current Outpatient Medications  Medication Sig Dispense Refill   aspirin EC 81 MG tablet Take 1 tablet (81 mg total) by mouth daily. Swallow whole. 30 tablet 0   atorvastatin (LIPITOR) 80 MG tablet Take 1 tablet (80 mg total) by mouth daily. 30 tablet 0   BIDIL 20-37.5 MG tablet Take 2 tablets by mouth 3 (three) times daily. 180 tablet 0   ezetimibe (ZETIA) 10 MG tablet Take 1 tablet (10 mg total) by mouth daily. 30 tablet 0   furosemide (LASIX) 40 MG tablet Take 1 tablet (40 mg total) by mouth daily. 30 tablet 3   gabapentin  (NEURONTIN) 100 MG capsule Take 1 capsule (100 mg total) by mouth every 8 (eight) hours. 90 capsule 3   hydrocortisone cream 1 % Apply topically 3 (three) times daily.     hydrOXYzine (ATARAX) 10 MG tablet Take 1 tablet (10 mg total) by mouth 3 (three) times daily as needed for itching or anxiety. 30 tablet 0   losartan (COZAAR) 100 MG tablet Take 1 tablet (100 mg total) by mouth daily. 30 tablet 3   melatonin 5 MG TABS Take 1 tablet (5 mg total) by mouth at bedtime.     metFORMIN (GLUCOPHAGE-XR) 500 MG 24 hr tablet Take 1 tablet (500 mg total) by mouth daily with breakfast. 30 tablet 3   nicotine (NICODERM CQ - DOSED IN MG/24 HOURS) 14 mg/24hr patch Place 1 patch (14 mg total) onto the skin daily as needed (nicotine cravings). 28 patch 0   sertraline (ZOLOFT) 50 MG tablet Take 3 tablets (150 mg total) by mouth daily. 90 tablet 0   spironolactone (ALDACTONE) 25 MG tablet Take 1 tablet (25 mg total) by mouth daily. 30 tablet 3   timolol (TIMOPTIC) 0.5 % ophthalmic solution Place 1 drop into both eyes daily after breakfast. 5 mL 0   No current facility-administered medications for this visit.   No Known Allergies  Social History   Socioeconomic History   Marital status: Widowed    Spouse name: Not on file   Number of children: 2   Years of education: 14   Highest education level: Not on file  Occupational History   Occupation: [employer]  Tobacco Use   Smoking status: Every Day    Types: Cigarettes   Smokeless tobacco: Former    Quit date: 11/14/2020  Vaping Use   Vaping status: Never Used  Substance and Sexual Activity   Alcohol use: No   Drug use: Yes    Types: Cocaine   Sexual activity: Yes  Other Topics Concern   Not on file  Social History Narrative   Lives with 2 young children in [place].  Wife died in May 25, 2020.  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   Left-handed   Caffeine: occasional tea   Social Drivers of Corporate investment banker  Strain: High Risk (01/24/2021)   Overall Financial Resource Strain (CARDIA)    Difficulty of Paying Living Expenses: Very hard  Food Insecurity: No Food Insecurity (01/14/2024)   Hunger Vital Sign    Worried About Running Out of Food in the Last Year: Never true    Ran Out of Food in the Last Year: Never true  Transportation Needs: No Transportation Needs (01/14/2024)   PRAPARE - Administrator, Civil Service (Medical): No    Lack of Transportation (Non-Medical): No  Physical Activity: Not on file  Stress: Stress Concern Present (01/24/2021)   [employer] of Occupational Health - Occupational Stress Questionnaire    Feeling of Stress : Very much  Social Connections: Unknown (05/01/2022)   Received from [facility], [facility]   Social Network    Social Network: Not on file  Intimate Partner Violence: Not At Risk (01/14/2024)   Humiliation, Afraid, Rape, and Kick questionnaire    Fear of Current or Ex-Partner: No    Emotionally Abused: No    Physically Abused: No    Sexually Abused: No   Family History  Problem Relation Age of Onset   Heart attack Father        died @ 86   Cervical cancer Mother        died @ 33   There were no vitals taken for this visit.  Wt Readings from Last 3 Encounters:  01/22/24 77.1 kg (170 lb)  11/20/23 71.2 kg (156 lb 15.5 oz)  11/13/23 80.4 kg (177 lb 3.2 oz)   PHYSICAL EXAM: General:  very antsy on assessment.  No respiratory difficulty HEENT: normal Neck: supple. JVD ~12 cm. Carotids 2+ bilat; no bruits. No lymphadenopathy or thyromegaly appreciated. Cor: PMI nondisplaced. Regular rate & rhythm. No rubs, gallops or murmurs. Lungs: clear Abdomen: soft, nontender, nondistended. No hepatosplenomegaly. No bruits or masses. Good bowel sounds. Extremities: no cyanosis, clubbing, rash, trace BLE edema  Neuro: alert & oriented x 3, cranial nerves grossly intact. moves all 4 extremities w/o difficulty. Affect pleasant.   ASSESSMENT &  PLAN: 1. 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  Due to poorly controlled HTN +/- cocaine abuse.  Normal coronaries on 2015 cath.  Echo in 1/22 showed EF < 20% with moderate RV dysfunction (off meds, + cocaine).  Echo in 5/22 with EF up to 50% (on meds and off cocaine). Echo 1/23 (off meds and on cocaine) EF down to 30-35%. Echo 7/24 EF 25-30%, GIDD, RV normal, LA mildly dilated, small pericardial effusion present. He is now living at [facility] and is off drugs and ETOH.  NYHA class I, mildly volume overloaded on exam - Increase lasix 20>40 mg daily - Has not been on Entresto 2/2 headache? per patient report. Will not restart today with elevated renal function - Continue spironolactone 25 mg daily.  - Restart Coreg 6.25 mg bid. Will check UDS.  - Restart BiDil 1 tab tid. - Did not tolerate Farxiga due to dizziness  - EF remains low on most recent echo. Will refer to EP for possible ICD placement.   - Discussed importance of med compliance. BMET, BNP today 2. HTN: BP mildly elevated, GDMT as above. Has not had meds this morning.   3. OSA: Not compliant with CPAP, needs new one. Will arrange sleep study for new machine. Has not scheduled f/u.  4. Tobacco Use Disorder: He has quit smoking.  5. Cocaine Use Disorder:  He has quit using cocaine.  UDS today, very fidgety/antsy on exam. 6. CKD 3: Check BMET today  7. Hyperlipidemia: Continue atorvastatin.  - LDL 16 8/23. Up to 103 7/24.  8. Depression: Controlled.  9. T2DM: management per PCP. - Did not tolerate Farxiga due to dizziness. 10. 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  Follow up in 4-6 weeks with APP to reassess volume.   Anderson Malta [place]way, FNP 02/07/24

## 2024-02-07 NOTE — Telephone Encounter (Signed)
 Called to confirm/remind patient of their appointment at the Advanced Heart Failure Clinic on 02/10/24.   Patient reminded to bring all medications and/or complete list.  Confirmed patient has transportation. Gave directions, instructed to utilize valet parking.  Confirmed appointment prior to ending call.

## 2024-02-10 ENCOUNTER — Encounter (HOSPITAL_COMMUNITY): Payer: Medicaid Other

## 2024-03-27 ENCOUNTER — Telehealth (HOSPITAL_COMMUNITY): Payer: Self-pay | Admitting: Licensed Clinical Social Worker

## 2024-03-27 NOTE — Telephone Encounter (Signed)
 Patient called to inquire about his sleep study time, date and location. CSW provided patient with the contact information needed. Patient will follow up and return call if needed. Lasandra Beech, LCSW, CCSW-MCS [phone]

## 2024-04-01 ENCOUNTER — Ambulatory Visit (HOSPITAL_BASED_OUTPATIENT_CLINIC_OR_DEPARTMENT_OTHER): Payer: Medicaid Other | Attending: Physician Assistant | Admitting: Internal Medicine

## 2024-04-13 ENCOUNTER — Telehealth (HOSPITAL_COMMUNITY): Payer: Self-pay

## 2024-04-13 NOTE — Telephone Encounter (Signed)
 Called to confirm/remind patient of their appointment at the Advanced Heart Failure Clinic on 04/14/24. However, pt needed to reschedule his appointment.  Pt's new appointment scheduled and patient is aware.

## 2024-04-14 ENCOUNTER — Encounter (HOSPITAL_COMMUNITY)

## 2024-04-20 ENCOUNTER — Ambulatory Visit: Payer: Medicaid Other | Attending: Family Medicine | Admitting: Family Medicine

## 2024-05-01 ENCOUNTER — Telehealth (HOSPITAL_COMMUNITY): Payer: Self-pay | Admitting: *Deleted

## 2024-05-01 NOTE — Telephone Encounter (Signed)
 Called to confirm/remind patient of their appointment at the Advanced Heart Failure Clinic on 05/04/24.        Appointment:              [] Confirmed             [] Left mess              [] No answer/No voice mail             [x] Phone not in service   Patient reminded to bring all medications and/or complete list.   Confirmed patient has transportation. Gave directions, instructed to utilize valet parking.

## 2024-05-01 NOTE — Progress Notes (Incomplete)
 Advanced Heart Failure Clinic Note   PCP: Joaquin Mulberry, MD HF Cardiologist: Dr. Mitzie Anda  Reason for Visit: Heart Failure Follow-up HPI: Matthew Petersonis a 58 y.o. with PMH significant for combined systolic CHF with biventricular failure, T2DM, HTN, OSA, tobacco use, HLD, cocaine use, poor medication adherence, depression,  CVA, and CKD 3a.     He was first admitted with HF in 2015 in the setting of long standing uncontrolled hypertension and smoking.  Had a LHC with normal cors.    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  Was not followed by cardiology or hospitalized for CHF during this time period.     March 2021 he began having CHF exacerbations after running out of meds between admissions.  Left AMA in March. Another similar admission in July, he was out of his medications at that time as well.     Admitted 12/2020 with CHF exacerbation after running out of his medications.  He was diuresed, put on an excellent medication regimen. He also endorsed suicidal ideation at that time and was subsequently admitted to behavioral health. UDS was positive for cocaine, echo showed EF < 20% with moderate RV dysfunction.   Echo 5/22 with EF improved to 50%.   Follow up 9/22 and was fluid overloaded and SOB. Had ran out of all of his meds, including lasix  ~3 months prior. ReDs 46%. Given Lasix  80 mg IV x 1 in the office, GDMT adjusted. Instructed to f/u in 1 week but no showed to the appt.   Follow up 11/22 he had been off several medications, more SOB, continued to use cocaine. Mediations restarted, pillbox filled. Seen back 12/22, stable NYHA III, volume OK ReDs 35%. Spiro restarted and advised 4 week follow up. No show x 2 appts.   Echo in 1/23 with EF 30-35%, mild LVH, normal RV.   Admitted 3/23 with SI and auditory hallucinations. Admitted again 1/25 with psychiatric concerns.   11/24: he returns for AHF follow up. Overall  feeling pretty good. Denies palpitations, CP, dizziness, edema, or PND/Orthopnea. SOB only with exertion. Appetite great. No fever or chills. Weight at home 178 pounds. Taking all medications except he has been out of coreg  and bidil  for a while. Denies ETOH, smoking or drug use. Needs sleep study arranged.  Housing stable.    He returns today for heart failure follow up. Overall feeling ***. NYHA ***. Reports {Symptoms; cardiac:12860::"dyspnea","fatigue"}. Denies {Symptoms; cardiac:12860::"chest pain","dyspnea","fatigue","near-syncope","orthopnea","palpitations","dizziness","abnormal bleeding"}. Able to perform ADLs. Appetite okay. Weight at home ***. BP at home***. Compliant with all medications.    PMH: 1. HTN 2. Type 2 diabetes 3. H/o cocaine abuse: Positive UDS 1/22.  4. CVA 2018 5. OSA: Uses CPAP.  6. Chronic systolic CHF: Nonischemic cardiomyopathy.  - LHC (2015): Normal coronaries.  - Echo (2021): EF 40-45%. - Echo (1/22): EF <20%, moderate LV dilation, moderately decreased RV systolic function, severe LAE.  - Echo (5/22): EF 50%, moderate LV dilation, normal RV.  - Echo (1/23): EF 30-35%, moderately decreased LV, normal RV, ascending aorta 39 mm - Echo today: read pending 7. Depression 8. CKD stage 3 9. Hyperlipidemia  Current Outpatient Medications  Medication Sig Dispense Refill   atorvastatin  (LIPITOR ) 80 MG tablet Take 1 tablet (80 mg total) by mouth daily. 30 tablet 0   ezetimibe  (ZETIA ) 10 MG tablet Take 1 tablet (10 mg total) by mouth daily. 30 tablet 0   furosemide  (LASIX ) 40 MG tablet Take  1 tablet (40 mg total) by mouth daily. 30 tablet 3   gabapentin  (NEURONTIN ) 100 MG capsule Take 1 capsule (100 mg total) by mouth every 8 (eight) hours. 90 capsule 3   hydrocortisone  cream 1 % Apply topically 3 (three) times daily.     hydrOXYzine  (ATARAX ) 10 MG tablet Take 1 tablet (10 mg total) by mouth 3 (three) times daily as needed for itching or anxiety. 30 tablet 0    losartan  (COZAAR ) 100 MG tablet Take 1 tablet (100 mg total) by mouth daily. 30 tablet 3   melatonin 5 MG TABS Take 1 tablet (5 mg total) by mouth at bedtime.     metFORMIN  (GLUCOPHAGE -XR) 500 MG 24 hr tablet Take 1 tablet (500 mg total) by mouth daily with breakfast. 30 tablet 3   nicotine  (NICODERM CQ  - DOSED IN MG/24 HOURS) 14 mg/24hr patch Place 1 patch (14 mg total) onto the skin daily as needed (nicotine  cravings). 28 patch 0   sertraline  (ZOLOFT ) 50 MG tablet Take 3 tablets (150 mg total) by mouth daily. 90 tablet 0   spironolactone  (ALDACTONE ) 25 MG tablet Take 1 tablet (25 mg total) by mouth daily. 30 tablet 3   timolol  (TIMOPTIC ) 0.5 % ophthalmic solution Place 1 drop into both eyes daily after breakfast. 5 mL 0   No current facility-administered medications for this visit.   No Known Allergies  Social History   Socioeconomic History   Marital status: Widowed    Spouse name: Not on file   Number of children: 2   Years of education: 14   Highest education level: Not on file  Occupational History   Occupation: [employer]  Tobacco Use   Smoking status: Every Day    Types: Cigarettes   Smokeless tobacco: Former    Quit date: 11/14/2020  Vaping Use   Vaping status: Never Used  Substance and Sexual Activity   Alcohol use: No   Drug use: Yes    Types: Cocaine   Sexual activity: Yes  Other Topics Concern   Not on file  Social History Narrative   Lives with 2 young children in [place].  Wife died in 2020-05-17.  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   Left-handed   Caffeine: occasional tea   Social Drivers of Corporate investment banker Strain: High Risk (01/24/2021)   Overall Financial Resource Strain (CARDIA)    Difficulty of Paying Living Expenses: Very hard  Food Insecurity: No Food Insecurity (01/14/2024)   Hunger Vital Sign    Worried About Running Out of Food in the Last Year: Never true    Ran Out of Food in the Last Year: Never true   Transportation Needs: No Transportation Needs (01/14/2024)   PRAPARE - Administrator, Civil Service (Medical): No    Lack of Transportation (Non-Medical): No  Physical Activity: Not on file  Stress: Stress Concern Present (01/24/2021)   [employer] of Occupational Health - Occupational Stress Questionnaire    Feeling of Stress : Very much  Social Connections: Unknown (05/01/2022)   Received from [facility], [facility]   Social Network    Social Network: Not on file  Intimate Partner Violence: Not At Risk (01/14/2024)   Humiliation, Afraid, Rape, and Kick questionnaire    Fear of Current or Ex-Partner: No    Emotionally Abused: No    Physically Abused: No    Sexually Abused: No   Family History  Problem  Relation Age of Onset   Heart attack Father        died @ 40   Cervical cancer Mother        died @ 34   There were no vitals taken for this visit.  Wt Readings from Last 3 Encounters:  01/22/24 77.1 kg (170 lb)  11/20/23 71.2 kg (156 lb 15.5 oz)  11/13/23 80.4 kg (177 lb 3.2 oz)   PHYSICAL EXAM: General:  very antsy on assessment.  No respiratory difficulty HEENT: normal Neck: supple. JVD ~12 cm. Carotids 2+ bilat; no bruits. No lymphadenopathy or thyromegaly appreciated. Cor: PMI nondisplaced. Regular rate & rhythm. No rubs, gallops or murmurs. Lungs: clear Abdomen: soft, nontender, nondistended. No hepatosplenomegaly. No bruits or masses. Good bowel sounds. Extremities: no cyanosis, clubbing, rash, trace BLE edema  Neuro: alert & oriented x 3, cranial nerves grossly intact. moves all 4 extremities w/o difficulty. Affect pleasant.   ReDs reading: *** %, {Norm/abn:16337}  ASSESSMENT & PLAN: 1. 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  Due to poorly controlled HTN +/- cocaine abuse.  Normal coronaries on 2015 cath.  Echo in 1/22 showed EF < 20% with moderate RV dysfunction (off meds, + cocaine).  Echo in 5/22 with EF up to 50% (on meds and  off cocaine). Echo 1/23 (off meds and on cocaine) EF down to 30-35%. Echo 7/24 EF 25-30%, GIDD, RV normal, LA mildly dilated, small pericardial effusion present. Echo 12/24 unchanged - NYHA class I, mildly volume overloaded on exam *** - Continue Lasix  40 mg daily - Continue spironolactone  25 mg daily.  - Restart Coreg  6.25 mg bid.   - Restart BiDil  1 tab tid. *** Off? - Did not tolerate Farxiga  due to dizziness  - Has not been on Entresto  2/2 headache? per patient report. Will not restart today with elevated renal function - EF remains low on most recent echo. Will refer to EP for possible ICD placement.   - Discussed importance of med compliance. BMET, BNP today  2. HTN: BP mildly elevated, GDMT as above. Has not had meds this morning.    3. OSA: Not compliant with CPAP, needs new one. Will arrange sleep study for new machine. Has not scheduled f/u.   4. Tobacco Use Disorder: He has quit smoking.   5. Cocaine Use Disorder:  UDS in 1/25 + cocaine  6. CKD 3: Check BMET today   7. Hyperlipidemia: Continue atorvastatin .  - LDL up to 103. Check lipid panel.  8. Depression: Controlled.   9. T2DM: management per PCP. - Did not tolerate Farxiga  due to dizziness.  10. 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  Follow up in 4-6 weeks with APP to reassess volume.  Follow up in *** with ***  Elmarie Hacking, FNP 05/01/24

## 2024-05-04 ENCOUNTER — Encounter (HOSPITAL_COMMUNITY)

## 2024-05-15 ENCOUNTER — Other Ambulatory Visit (HOSPITAL_COMMUNITY): Payer: Self-pay

## 2024-05-15 ENCOUNTER — Other Ambulatory Visit: Payer: Self-pay

## 2024-05-18 ENCOUNTER — Other Ambulatory Visit (HOSPITAL_COMMUNITY): Payer: Self-pay

## 2024-05-18 ENCOUNTER — Other Ambulatory Visit: Payer: Self-pay

## 2024-05-26 ENCOUNTER — Ambulatory Visit (HOSPITAL_COMMUNITY)
Admission: EM | Admit: 2024-05-26 | Discharge: 2024-05-27 | Disposition: A | Discharge status: Home / Self Care | Attending: Psychiatry | Admitting: Psychiatry

## 2024-05-26 DIAGNOSIS — Z7984 Long term (current) use of oral hypoglycemic drugs: Secondary | ICD-10-CM | POA: Insufficient documentation

## 2024-05-26 DIAGNOSIS — Z79899 Other long term (current) drug therapy: Secondary | ICD-10-CM | POA: Insufficient documentation

## 2024-05-26 DIAGNOSIS — F411 Generalized anxiety disorder: Secondary | ICD-10-CM | POA: Insufficient documentation

## 2024-05-26 DIAGNOSIS — F1410 Cocaine abuse, uncomplicated: Secondary | ICD-10-CM | POA: Insufficient documentation

## 2024-05-26 DIAGNOSIS — G4700 Insomnia, unspecified: Secondary | ICD-10-CM | POA: Insufficient documentation

## 2024-05-26 DIAGNOSIS — F1010 Alcohol abuse, uncomplicated: Secondary | ICD-10-CM | POA: Insufficient documentation

## 2024-05-26 DIAGNOSIS — F410 Panic disorder [episodic paroxysmal anxiety] without agoraphobia: Secondary | ICD-10-CM | POA: Insufficient documentation

## 2024-05-26 LAB — POCT URINE DRUG SCREEN - MANUAL ENTRY (I-SCREEN)
POC Amphetamine UR: NOT DETECTED
POC Buprenorphine (BUP): NOT DETECTED
POC Cocaine UR: POSITIVE — AB
POC Marijuana UR: NOT DETECTED
POC Methadone UR: NOT DETECTED
POC Methamphetamine UR: NOT DETECTED
POC Morphine: NOT DETECTED
POC Oxazepam (BZO): NOT DETECTED
POC Oxycodone UR: NOT DETECTED
POC Secobarbital (BAR): NOT DETECTED

## 2024-05-26 MED ORDER — SPIRONOLACTONE 25 MG PO TABS
25.0000 mg | ORAL_TABLET | Freq: Every day | ORAL | Status: DC
  Administered 2024-05-27: 25 mg via ORAL
  Filled 2024-05-26 (×2): qty 1

## 2024-05-26 MED ORDER — LOSARTAN POTASSIUM 50 MG PO TABS
100.0000 mg | ORAL_TABLET | Freq: Every day | ORAL | Status: DC
  Administered 2024-05-27: 100 mg via ORAL
  Filled 2024-05-26: qty 2

## 2024-05-26 MED ORDER — EZETIMIBE 10 MG PO TABS
10.0000 mg | ORAL_TABLET | Freq: Every day | ORAL | Status: DC
  Administered 2024-05-27: 10 mg via ORAL
  Filled 2024-05-26: qty 1

## 2024-05-26 MED ORDER — METFORMIN HCL ER 500 MG PO TB24
500.0000 mg | ORAL_TABLET | Freq: Every day | ORAL | Status: DC
  Administered 2024-05-27: 500 mg via ORAL
  Filled 2024-05-26: qty 1

## 2024-05-26 MED ORDER — ALUM & MAG HYDROXIDE-SIMETH 200-200-20 MG/5ML PO SUSP: 30.0000 mL | ORAL | Status: DC | PRN

## 2024-05-26 MED ORDER — TIMOLOL MALEATE 0.5 % OP SOLN
1.0000 [drp] | Freq: Every day | OPHTHALMIC | Status: DC
  Administered 2024-05-27: 1 [drp] via OPHTHALMIC
  Filled 2024-05-26: qty 5

## 2024-05-26 MED ORDER — FUROSEMIDE 40 MG PO TABS
40.0000 mg | ORAL_TABLET | Freq: Every day | ORAL | Status: DC
  Administered 2024-05-27: 40 mg via ORAL
  Filled 2024-05-26: qty 1

## 2024-05-26 MED ORDER — ATORVASTATIN CALCIUM 40 MG PO TABS
80.0000 mg | ORAL_TABLET | Freq: Every day | ORAL | Status: DC
  Administered 2024-05-27: 80 mg via ORAL
  Filled 2024-05-26: qty 2

## 2024-05-26 MED ORDER — ACETAMINOPHEN 325 MG PO TABS: 650.0000 mg | ORAL_TABLET | Freq: Four times a day (QID) | ORAL | Status: DC | PRN

## 2024-05-26 MED ORDER — GABAPENTIN 100 MG PO CAPS
100.0000 mg | ORAL_CAPSULE | Freq: Three times a day (TID) | ORAL | Status: DC
  Administered 2024-05-27 (×2): 100 mg via ORAL
  Filled 2024-05-26 (×2): qty 1

## 2024-05-26 MED ORDER — MAGNESIUM HYDROXIDE 400 MG/5ML PO SUSP: 30.0000 mL | Freq: Every day | ORAL | Status: DC | PRN

## 2024-05-26 MED ORDER — OLANZAPINE 10 MG IM SOLR: 5.0000 mg | Freq: Three times a day (TID) | INTRAMUSCULAR | Status: DC | PRN

## 2024-05-26 MED ORDER — NICOTINE 14 MG/24HR TD PT24: 14.0000 mg | MEDICATED_PATCH | Freq: Every day | TRANSDERMAL | Status: DC | PRN

## 2024-05-26 MED ORDER — OLANZAPINE 5 MG PO TBDP: 5.0000 mg | ORAL_TABLET | Freq: Three times a day (TID) | ORAL | Status: DC | PRN

## 2024-05-26 MED ORDER — OLANZAPINE 10 MG IM SOLR: 10.0000 mg | Freq: Three times a day (TID) | INTRAMUSCULAR | Status: DC | PRN

## 2024-05-26 MED ORDER — MELATONIN 5 MG PO TABS: 5.0000 mg | ORAL_TABLET | Freq: Every day | ORAL | Status: DC

## 2024-05-26 NOTE — ED Provider Notes (Signed)
 [facility] Continuous Assessment Admission H&P  Date: 05/26/24 Patient Name: Matthew [name]. MRN: [id number] Chief Complaint: having bad anxiety problem   Diagnoses:  Final diagnoses:  Cocaine abuse (HCC)  Alcohol abuse  GAD (generalized anxiety disorder)    HPI: Matthew [name], 68 y/ male with a history of MDD, GAD, suicidal ideation, alcohol use.  Presented to [facility] via [employer].  Per the patient he has bad anxiety problems.  According to the patient he had passive suicidal ideation a couple days ago.  Patient stated that he was doing crack cocaine today, on Friday, on Saturday.  Patient stated that he also was drinking alcohol but he does not drink it on a regular.  According to the patient he is currently disabled, and since he lost his wife a few years ago every time this year he feels depressed and anxious.  Patient denies access to guns.  [name]-to-[name] evaluation of patient, patient is alert and oriented x 4, speech is clear but does have a starter.  Patient appears very anxious, patient denies current SI but stated that he was having passive suicidal ideation a couple days ago.  Patient denies HI, AVH or paranoia.  Reports he has been using cocaine's Friday, Saturday, and today.  Patient also reports he has been drinking alcohol but stated he does not drink on a regular basis.  Patient currently denied wanting to hurt himself or others.  At this present moment patient does not seem to be influenced by internal stimuli.  It is unsure if patient is forthcoming with information.  However given patient prior history and current presentation.  Writer discussed with patient that we will admit him for overnight observation and reassessment in the a.m. +  Recommend observation  Total Time spent with patient: 20 minutes  Musculoskeletal  Strength & Muscle Tone: within normal limits Gait & Station: normal Patient leans: N/A  Psychiatric Specialty Exam  Presentation General  Appearance:  Casual  Eye Contact: Good  Speech: Garbled  Speech Volume: Decreased  Handedness: Right   Mood and Affect  Mood: Anxious  Affect: Congruent   Thought Process  Thought Processes: Linear  Descriptions of Associations:Intact  Orientation:Full (Time, Place and Person)  Thought Content:WDL  Diagnosis of Schizophrenia or Schizoaffective disorder in past: No   Hallucinations:Hallucinations: None  Ideas of Reference:None  Suicidal Thoughts:Suicidal Thoughts: Yes, Passive SI Passive Intent and/or Plan: Without Intent; Without Plan  Homicidal Thoughts:Homicidal Thoughts: No   Sensorium  Memory: Immediate Fair  Judgment: Poor  Insight: Fair   Chartered certified accountant: Fair  Attention Span: Fair  Recall: Good  Fund of Knowledge: Good  Language: Good   Psychomotor Activity  Psychomotor Activity: Psychomotor Activity: Normal   Assets  Assets: Desire for Improvement; Resilience   Sleep  Sleep: Sleep: Fair Number of Hours of Sleep: 7   Nutritional Assessment (For OBS and FBC admissions only) Has the patient had a weight loss or gain of 10 pounds or more in the last 3 months?: No Has the patient had a decrease in food intake/or appetite?: No Does the patient have dental problems?: No Does the patient have eating habits or behaviors that may be indicators of an eating disorder including binging or inducing vomiting?: No Has the patient recently lost weight without trying?: 0 Has the patient been eating poorly because of a decreased appetite?: 0 Malnutrition Screening Tool Score: 0    Physical Exam HENT:     Head: Normocephalic.  Nose: Nose normal.  Eyes:     Pupils: Pupils are equal, round, and reactive to light.  Cardiovascular:     Rate and Rhythm: Normal rate.  Pulmonary:     Effort: Pulmonary effort is normal.  Musculoskeletal:        General: Normal range of motion.     Cervical back: Normal  range of motion.  Neurological:     General: No focal deficit present.     Mental Status: He is alert.  Psychiatric:        Mood and Affect: Mood normal.        Behavior: Behavior normal.        Thought Content: Thought content normal.        Judgment: Judgment normal.    Review of Systems  Constitutional: Negative.   HENT: Negative.    Eyes: Negative.   Respiratory: Negative.    Cardiovascular: Negative.   Gastrointestinal: Negative.   Genitourinary: Negative.   Musculoskeletal: Negative.   Skin: Negative.   Neurological: Negative.   Psychiatric/Behavioral:  Positive for substance abuse. The patient is nervous/anxious.     Blood pressure (!) 151/140, pulse 74, temperature 98.6 F (37 C), temperature source Oral, resp. rate 15, SpO2 97%. There is no height or weight on file to calculate BMI.  Past Psychiatric History: SI, MDD, cocaine abuse, general anxiety.  Is the patient at risk to self? Yes  Has the patient been a risk to self in the past 6 months? Yes .    Has the patient been a risk to self within the distant past? Yes   Is the patient a risk to others? No   Has the patient been a risk to others in the past 6 months? No   Has the patient been a risk to others within the distant past? No   Past Medical History: See chart  Family History: Unknown  Social History: Cocaine, alcohol  Last Labs:  Office Visit on 01/22/2024  Component Date Value Ref Range Status   POC Glucose 01/22/2024 131 (A)  70 - 99 mg/dl Final  Admission on 30/86/5784, Discharged on 01/14/2024  Component Date Value Ref Range Status   WBC 01/13/2024 4.0  4.0 - 10.5 K/uL Final   RBC 01/13/2024 4.61  4.22 - 5.81 MIL/uL Final   Hemoglobin 01/13/2024 13.8  13.0 - 17.0 g/dL Final   HCT 69/62/9528 41.2  39.0 - 52.0 % Final   MCV 01/13/2024 89.4  80.0 - 100.0 fL Final   MCH 01/13/2024 29.9  26.0 - 34.0 pg Final   MCHC 01/13/2024 33.5  30.0 - 36.0 g/dL Final   RDW 41/32/4401 13.6  11.5 - 15.5 %  Final   Platelets 01/13/2024 224  150 - 400 K/uL Final   nRBC 01/13/2024 0.0  0.0 - 0.2 % Final   Neutrophils Relative % 01/13/2024 58  % Final   Neutro Abs 01/13/2024 2.3  1.7 - 7.7 K/uL Final   Lymphocytes Relative 01/13/2024 32  % Final   Lymphs Abs 01/13/2024 1.3  0.7 - 4.0 K/uL Final   Monocytes Relative 01/13/2024 8  % Final   Monocytes Absolute 01/13/2024 0.3  0.1 - 1.0 K/uL Final   Eosinophils Relative 01/13/2024 1  % Final   Eosinophils Absolute 01/13/2024 0.0  0.0 - 0.5 K/uL Final   Basophils Relative 01/13/2024 1  % Final   Basophils Absolute 01/13/2024 0.0  0.0 - 0.1 K/uL Final   Immature Granulocytes 01/13/2024 0  % Final  Abs Immature Granulocytes 01/13/2024 0.01  0.00 - 0.07 K/uL Final   Performed at [facility], [address] [place], [place] [address]   Sodium 01/13/2024 138  135 - 145 mmol/L Final   Potassium 01/13/2024 4.2  3.5 - 5.1 mmol/L Final   Chloride 01/13/2024 104  98 - 111 mmol/L Final   CO2 01/13/2024 26  22 - 32 mmol/L Final   Glucose, Bld 01/13/2024 123 (H)  70 - 99 mg/dL Final   Glucose reference range applies only to samples taken after fasting for at least 8 hours.   BUN 01/13/2024 15  6 - 20 mg/dL Final   Creatinine, Ser 01/13/2024 1.43 (H)  0.61 - 1.24 mg/dL Final   Calcium  01/13/2024 9.3  8.9 - 10.3 mg/dL Final   Total Protein 09/81/1914 6.5  6.5 - 8.1 g/dL Final   Albumin 78/29/5621 3.5  3.5 - 5.0 g/dL Final   AST 30/86/5784 16  15 - 41 U/L Final   ALT 01/13/2024 10  0 - 44 U/L Final   Alkaline Phosphatase 01/13/2024 55  38 - 126 U/L Final   Total Bilirubin 01/13/2024 0.7  0.0 - 1.2 mg/dL Final   GFR, Estimated 01/13/2024 58 (L)  >60 mL/min Final   Comment: (NOTE) Calculated using the CKD-EPI Creatinine Equation (2021)    Anion gap 01/13/2024 8  5 - 15 Final   Performed at [facility], [address] [place], [place] [address]   Alcohol, Ethyl (B) 01/13/2024 <10  <10 mg/dL Final   Comment: (NOTE) Lowest detectable limit  for serum alcohol is 10 mg/dL.  For medical purposes only. Performed at [facility], [address], [place], [place] [address]    TSH 01/13/2024 0.772  0.350 - 4.500 uIU/mL Final   Comment: Performed by a 3rd Generation assay with a functional sensitivity of <=0.01 uIU/mL. Performed at [facility], [address], [place], [place] [address]    POC Amphetamine UR 01/13/2024 None Detected  NONE DETECTED (Cut Off Level 1000 ng/mL) Final   POC Secobarbital (BAR) 01/13/2024 None Detected  NONE DETECTED (Cut Off Level 300 ng/mL) Final   POC Buprenorphine (BUP) 01/13/2024 None Detected  NONE DETECTED (Cut Off Level 10 ng/mL) Final   POC Oxazepam (BZO) 01/13/2024 None Detected  NONE DETECTED (Cut Off Level 300 ng/mL) Final   POC Cocaine UR 01/13/2024 Positive (A)  NONE DETECTED (Cut Off Level 300 ng/mL) Final   POC Methamphetamine UR 01/13/2024 None Detected  NONE DETECTED (Cut Off Level 1000 ng/mL) Final   POC Morphine 01/13/2024 None Detected  NONE DETECTED (Cut Off Level 300 ng/mL) Final   POC Methadone UR 01/13/2024 None Detected  NONE DETECTED (Cut Off Level 300 ng/mL) Final   POC Oxycodone UR 01/13/2024 None Detected  NONE DETECTED (Cut Off Level 100 ng/mL) Final   POC Marijuana UR 01/13/2024 None Detected  NONE DETECTED (Cut Off Level 50 ng/mL) Final    Allergies: Patient has no known allergies.  Medications:  PTA Medications  Medication Sig   nicotine  (NICODERM CQ  - DOSED IN MG/24 HOURS) 14 mg/24hr patch Place 1 patch (14 mg total) onto the skin daily as needed (nicotine  cravings).   hydrOXYzine  (ATARAX ) 10 MG tablet Take 1 tablet (10 mg total) by mouth 3 (three) times daily as needed for itching or anxiety.   melatonin 5 MG TABS Take 1 tablet (5 mg total) by mouth at bedtime.   hydrocortisone  cream 1 % Apply topically  3 (three) times daily.   timolol  (TIMOPTIC ) 0.5 % ophthalmic solution Place 1 drop into both eyes daily after breakfast.   atorvastatin  (LIPITOR ) 80 MG  tablet Take 1 tablet (80 mg total) by mouth daily.   ezetimibe  (ZETIA ) 10 MG tablet Take 1 tablet (10 mg total) by mouth daily.   furosemide  (LASIX ) 40 MG tablet Take 1 tablet (40 mg total) by mouth daily.   gabapentin  (NEURONTIN ) 100 MG capsule Take 1 capsule (100 mg total) by mouth every 8 (eight) hours.   losartan  (COZAAR ) 100 MG tablet Take 1 tablet (100 mg total) by mouth daily.   metFORMIN  (GLUCOPHAGE -XR) 500 MG 24 hr tablet Take 1 tablet (500 mg total) by mouth daily with breakfast.   spironolactone  (ALDACTONE ) 25 MG tablet Take 1 tablet (25 mg total) by mouth daily.   sertraline  (ZOLOFT ) 50 MG tablet Take 3 tablets (150 mg total) by mouth daily.      Medical Decision Making  Observation unit    Recommendations  Based on my evaluation the patient does not appear to have an emergency medical condition.  Dorthea Gauze, NP 05/26/24  11:28 PM

## 2024-05-26 NOTE — Progress Notes (Signed)
   05/26/24 2242  [facility] Triage Screening (Walk-ins at [facility] only)  How Did You Hear About Us ? Legal System  What Is the Reason for Your Visit/Call Today? Pt arrived to [facility] accompanied by [employer] with concerns about his mental health. He states that he has been grieving and also extremely stressed out. He states that he has ahad some bad nights and had recently been on a binge of alcohol and beer he also admits to using cocaine earlier today. he is diagnosed with depression and anxiety and he is currently out of his medicine.  How Long Has This Been Causing You Problems? 1 wk - 1 month  Have You Recently Had Any Thoughts About Hurting Yourself? Yes  How long ago did you have thoughts about hurting yourself? Brief thoughts of SI over the weekend  Are You Planning to Commit Suicide/Harm Yourself At This time? No  Have you Recently Had Thoughts About Hurting Someone Marigene Shoulder? No  Are You Planning To Harm Someone At This Time? No  Physical Abuse Denies  Verbal Abuse Denies  Sexual Abuse Denies  Exploitation of patient/patient's resources Denies  Self-Neglect Denies  Are you currently experiencing any auditory, visual or other hallucinations? Yes  Please explain the hallucinations you are currently experiencing: VH: a person talking to him and hearing command hallucinations.  Have You Used Any Alcohol or Drugs in the Past 24 Hours? Yes  What Did You Use and How Much? 10 beers and about $20 worth of cocaine/  Do you have any current medical co-morbidities that require immediate attention? Yes  Please describe current medical co-morbidities that require immediate attention: High blood Pressure, Pre-diabetic.  Determination of Need Urgent (48 hours)  Options For Referral [facility];Medication Management;Therapeutic Triage Services;Other: Comment  Determination of Need filed? Yes

## 2024-05-27 DIAGNOSIS — F410 Panic disorder [episodic paroxysmal anxiety] without agoraphobia: Secondary | ICD-10-CM | POA: Diagnosis not present

## 2024-05-27 DIAGNOSIS — F1010 Alcohol abuse, uncomplicated: Secondary | ICD-10-CM | POA: Diagnosis not present

## 2024-05-27 DIAGNOSIS — F1410 Cocaine abuse, uncomplicated: Secondary | ICD-10-CM | POA: Diagnosis not present

## 2024-05-27 DIAGNOSIS — F411 Generalized anxiety disorder: Secondary | ICD-10-CM | POA: Diagnosis not present

## 2024-05-27 LAB — COMPREHENSIVE METABOLIC PANEL WITH GFR
ALT: 18 U/L (ref 0–44)
AST: 16 U/L (ref 15–41)
Albumin: 3.4 g/dL — ABNORMAL LOW (ref 3.5–5.0)
Alkaline Phosphatase: 66 U/L (ref 38–126)
Anion gap: 6 (ref 5–15)
BUN: 23 mg/dL — ABNORMAL HIGH (ref 6–20)
CO2: 25 mmol/L (ref 22–32)
Calcium: 8.9 mg/dL (ref 8.9–10.3)
Chloride: 104 mmol/L (ref 98–111)
Creatinine, Ser: 1.77 mg/dL — ABNORMAL HIGH (ref 0.61–1.24)
GFR, Estimated: 44 mL/min — ABNORMAL LOW (ref >60)
Glucose, Bld: 128 mg/dL — ABNORMAL HIGH (ref 70–99)
Potassium: 4 mmol/L (ref 3.5–5.1)
Sodium: 135 mmol/L (ref 135–145)
Total Bilirubin: 0.3 mg/dL (ref 0.0–1.2)
Total Protein: 6.1 g/dL — ABNORMAL LOW (ref 6.5–8.1)

## 2024-05-27 LAB — CBC WITH DIFFERENTIAL/PLATELET
Abs Immature Granulocytes: 0.01 10*3/uL (ref 0.00–0.07)
Basophils Absolute: 0 10*3/uL (ref 0.0–0.1)
Basophils Relative: 1 %
Eosinophils Absolute: 0.1 10*3/uL (ref 0.0–0.5)
Eosinophils Relative: 2 %
HCT: 43 % (ref 39.0–52.0)
Hemoglobin: 14.3 g/dL (ref 13.0–17.0)
Immature Granulocytes: 0 %
Lymphocytes Relative: 44 %
Lymphs Abs: 2 10*3/uL (ref 0.7–4.0)
MCH: 29.7 pg (ref 26.0–34.0)
MCHC: 33.3 g/dL (ref 30.0–36.0)
MCV: 89.2 fL (ref 80.0–100.0)
Monocytes Absolute: 0.5 10*3/uL (ref 0.1–1.0)
Monocytes Relative: 11 %
Neutro Abs: 1.9 10*3/uL (ref 1.7–7.7)
Neutrophils Relative %: 42 %
Platelets: 208 10*3/uL (ref 150–400)
RBC: 4.82 MIL/uL (ref 4.22–5.81)
RDW: 12.6 % (ref 11.5–15.5)
WBC: 4.5 10*3/uL (ref 4.0–10.5)
nRBC: 0 % (ref 0.0–0.2)

## 2024-05-27 LAB — ETHANOL: Alcohol, Ethyl (B): <15 mg/dL (ref <15)

## 2024-05-27 LAB — TSH: TSH: 2.22 u[IU]/mL (ref 0.350–4.500)

## 2024-05-27 NOTE — ED Notes (Signed)
 Stable. A&O x 4.  Discharging to home self care.   Denies current SI plan and Intent.  Denies HI and A/V hallucinations.   All belongings returned to PT.  F/U instruction reviewed   Pt verbalized understanding

## 2024-05-27 NOTE — ED Provider Notes (Signed)
 FBC/OBS ASAP Discharge Summary  Date and Time: 05/27/2024 11:05 AM  Name: Matthew [name].  MRN:  [id number]   Discharge Diagnoses:  Final diagnoses:  Cocaine abuse (HCC)  Alcohol abuse  GAD (generalized anxiety disorder)   HPI: Matthew [name]. is a 58 y.o. AA male with a prior mental health history of MDD, GAD, polysubstance abuse (alcohol and cocaine) who [facility] into [facility] [place] [facility] last night with complaints of passive SI x a few days. Per admissions assessment: Per the patient he has bad anxiety problems. According to the patient he had passive suicidal ideation a couple days ago. Patient stated that he was doing crack cocaine today, on Friday, on Saturday. Patient stated that he also was drinking alcohol but he does not drink it on a regular. According to the patient he is currently disabled, and since he lost his wife a few years ago every time this year he feels depressed and anxious. Patient denies access to guns. Dorthea Gauze, NP, Date of Service: 05/26/2024 11:21 PM).  Stay Summary: Patient was admitted to the Observation unit here at the [facility] overnight, and reassessed today morning by writer. On assessment this morning, pt denies SI, denies HI, denies AVH. He denies paranoia, and there are no evidences of delusional thinking of overt psychosis. Pt shares that the reason for his presentation to this urgent care center yesterday was due to a panic attack, but is calmly speaking with Clinical research associate and there are no overt signs of anxiety or panic symptoms.   Patient recounts that his wife died multiple years ago and he is still grieving from her loss, which is causing him to use cocaine. He perseverates about wanting to go to [facility], but denies all depressive symptoms with the exception of insomnia, and states that he has not been sleeping well, for the past week, but this is most likely related to his cocaine abuse.   Pt persistently asking about wanting to  be transferred to the [facility], and he is asked why he did not follow up with his outpatient recommendations for [facility] outpatient when discharged from [facility] in February of this year, but did not respond. Shares that he has not been on medications for management of his mental health symptoms since discharge from [facility]. Pt is focused today on food on the unit, engages in argumentative behaviors with the RN who is attempting to assist him with breakfast, is entitled, tells Clinical research associate to lower her voice and to respect his privacy because he does not want other people hearing my business.   He talks about wanting to go to [facility] in order to participate in the group activities, and considerations being made for the [facility], as it will be shorted term, and he will be able to benefit from group sessions, but pt seems not motivated at this time to get the help that he needs with his substance addiction. He is asked if he would like to go to rehabilitation for a period such as for 30 days, which will be the goal after an [facility] stay so as to maintain his sobriety, and equip him with other resources which he needs to stay sober, but he declines, and stays focused on wanting to go to [facility].  Patient states that he has a home to return to, and will be discharged, with a plan to follow up outpatient by walking into [facility] in [place], where he  was asked to follow up after his discharge  from [facility] in 01/2024, but did not show up for his appt. Writer attempted to make another appt for him, but was unable to. He has been educated to walk into that location as soon as today to reestablish services for his mental health.  Suicide Risk Assessment: Minimal:  No identifiable suicidal ideation.  Patients presenting with no risk factors but with morbid ruminations; may be classified as minimal risk based on the severity of the depressive symptoms. Patient has some risk fators such as low socio economic status, & substance abuse, but denies any  suicide attempts in the past, denies current suicidal ideations or plans, denies any intent to harm self or others. He reports that he has a home and resides by himself, has an income (receives disability income).  Disposition:  Discharge home with plan to follow up at [facility], [place], [place]. Educated to walk in to make an appointment since they will not reschedule an appt given that he missed previous one. -Educated to follow up with his Primary care team for primary care services, and all other health conditions including elevated blood pressure. -Patient has been >3 months with no psychotropic medications. There is not enough time for us  to restart meds and monitor prior to discharge. We are deferring medications for his outpatient provider to start and manage. -Educated substance use cessation. -Education provided on the fact that if experiencing worsening of psychiatry symptoms including suicidal ideations, homicidal ideations, or having auditory/visual hallucinations, etc, to call 911, [phone], come back to this location, or go to the nearest ER. Pt verbalized understanding. Left hospital with no concerns.  Total Time spent with patient: 45 minutes A prescription for an FDA-approved tobacco cessation medication was offered at discharge and the patient refused  Current Medications:  Current Facility-Administered Medications  Medication Dose Route Frequency Provider Last Rate Last Admin   acetaminophen  (TYLENOL ) tablet 650 mg  650 mg Oral Q6H PRN Dorthea Gauze, NP       alum & mag hydroxide-simeth (MAALOX/MYLANTA) 200-200-20 MG/5ML suspension 30 mL  30 mL Oral Q4H PRN Dorthea Gauze, NP       atorvastatin  (LIPITOR ) tablet 80 mg  80 mg Oral Daily Dorthea Gauze, NP   80 mg at 05/27/24 0844   ezetimibe  (ZETIA ) tablet 10 mg  10 mg Oral Daily Dorthea Gauze, NP   10 mg at 05/27/24 1610   furosemide  (LASIX ) tablet 40 mg  40 mg Oral Daily Dorthea Gauze, NP   40 mg at 05/27/24 9604   gabapentin   (NEURONTIN ) capsule 100 mg  100 mg Oral Q8H Dorthea Gauze, NP   100 mg at 05/27/24 5409   losartan  (COZAAR ) tablet 100 mg  100 mg Oral Daily Dorthea Gauze, NP   100 mg at 05/27/24 8119   magnesium  hydroxide (MILK OF MAGNESIA) suspension 30 mL  30 mL Oral Daily PRN Dorthea Gauze, NP       melatonin tablet 5 mg  5 mg Oral QHS Dorthea Gauze, NP       metFORMIN  (GLUCOPHAGE -XR) 24 hr tablet 500 mg  500 mg Oral Q breakfast Dorthea Gauze, NP   500 mg at 05/27/24 0841   nicotine  (NICODERM CQ  - dosed in mg/24 hours) patch 14 mg  14 mg Transdermal Daily PRN Dorthea Gauze, NP       OLANZapine  (ZYPREXA ) injection 10 mg  10 mg Intramuscular TID PRN Dorthea Gauze, NP       OLANZapine  (ZYPREXA ) injection 5 mg  5 mg Intramuscular TID PRN Dorthea Gauze, NP  OLANZapine  zydis (ZYPREXA ) disintegrating tablet 5 mg  5 mg Oral TID PRN Dorthea Gauze, NP       spironolactone  (ALDACTONE ) tablet 25 mg  25 mg Oral Daily Dorthea Gauze, NP   25 mg at 05/27/24 0840   timolol  (TIMOPTIC ) 0.5 % ophthalmic solution 1 drop  1 drop Both Eyes QPC breakfast Dorthea Gauze, NP   1 drop at 05/27/24 0844   Current Outpatient Medications  Medication Sig Dispense Refill   atorvastatin  (LIPITOR ) 80 MG tablet Take 1 tablet (80 mg total) by mouth daily. 30 tablet 0   furosemide  (LASIX ) 40 MG tablet Take 1 tablet (40 mg total) by mouth daily. 30 tablet 3   gabapentin  (NEURONTIN ) 100 MG capsule Take 1 capsule (100 mg total) by mouth every 8 (eight) hours. 90 capsule 3   losartan  (COZAAR ) 100 MG tablet Take 1 tablet (100 mg total) by mouth daily. 30 tablet 3   melatonin 5 MG TABS Take 1 tablet (5 mg total) by mouth at bedtime.     metFORMIN  (GLUCOPHAGE -XR) 500 MG 24 hr tablet Take 1 tablet (500 mg total) by mouth daily with breakfast. 30 tablet 3   spironolactone  (ALDACTONE ) 25 MG tablet Take 1 tablet (25 mg total) by mouth daily. 30 tablet 3   ezetimibe  (ZETIA ) 10 MG tablet Take 1 tablet (10 mg total) by mouth daily. 30 tablet 0    hydrocortisone  cream 1 % Apply topically 3 (three) times daily. (Patient not taking: Reported on 05/27/2024)     hydrOXYzine  (ATARAX ) 10 MG tablet Take 1 tablet (10 mg total) by mouth 3 (three) times daily as needed for itching or anxiety. (Patient not taking: Reported on 05/27/2024) 30 tablet 0   sertraline  (ZOLOFT ) 50 MG tablet Take 3 tablets (150 mg total) by mouth daily. (Patient not taking: Reported on 05/27/2024) 90 tablet 0   timolol  (TIMOPTIC ) 0.5 % ophthalmic solution Place 1 drop into both eyes daily after breakfast. 5 mL 0    PTA Medications:  Facility Ordered Medications  Medication   acetaminophen  (TYLENOL ) tablet 650 mg   alum & mag hydroxide-simeth (MAALOX/MYLANTA) 200-200-20 MG/5ML suspension 30 mL   magnesium  hydroxide (MILK OF MAGNESIA) suspension 30 mL   OLANZapine  zydis (ZYPREXA ) disintegrating tablet 5 mg   OLANZapine  (ZYPREXA ) injection 5 mg   OLANZapine  (ZYPREXA ) injection 10 mg   atorvastatin  (LIPITOR ) tablet 80 mg   ezetimibe  (ZETIA ) tablet 10 mg   furosemide  (LASIX ) tablet 40 mg   gabapentin  (NEURONTIN ) capsule 100 mg   losartan  (COZAAR ) tablet 100 mg   melatonin tablet 5 mg   metFORMIN  (GLUCOPHAGE -XR) 24 hr tablet 500 mg   nicotine  (NICODERM CQ  - dosed in mg/24 hours) patch 14 mg   spironolactone  (ALDACTONE ) tablet 25 mg   timolol  (TIMOPTIC ) 0.5 % ophthalmic solution 1 drop   PTA Medications  Medication Sig   melatonin 5 MG TABS Take 1 tablet (5 mg total) by mouth at bedtime.   atorvastatin  (LIPITOR ) 80 MG tablet Take 1 tablet (80 mg total) by mouth daily.   furosemide  (LASIX ) 40 MG tablet Take 1 tablet (40 mg total) by mouth daily.   gabapentin  (NEURONTIN ) 100 MG capsule Take 1 capsule (100 mg total) by mouth every 8 (eight) hours.   losartan  (COZAAR ) 100 MG tablet Take 1 tablet (100 mg total) by mouth daily.   metFORMIN  (GLUCOPHAGE -XR) 500 MG 24 hr tablet Take 1 tablet (500 mg total) by mouth daily with breakfast.   spironolactone  (ALDACTONE ) 25 MG tablet  Take 1  tablet (25 mg total) by mouth daily.   hydrOXYzine  (ATARAX ) 10 MG tablet Take 1 tablet (10 mg total) by mouth 3 (three) times daily as needed for itching or anxiety. (Patient not taking: Reported on 05/27/2024)   hydrocortisone  cream 1 % Apply topically 3 (three) times daily. (Patient not taking: Reported on 05/27/2024)   timolol  (TIMOPTIC ) 0.5 % ophthalmic solution Place 1 drop into both eyes daily after breakfast.   ezetimibe  (ZETIA ) 10 MG tablet Take 1 tablet (10 mg total) by mouth daily.   sertraline  (ZOLOFT ) 50 MG tablet Take 3 tablets (150 mg total) by mouth daily. (Patient not taking: Reported on 05/27/2024)       11/04/2023    2:55 PM 08/13/2022    2:27 PM 04/22/2022    1:54 PM  Depression screen PHQ 2/9  Decreased Interest 1 3 2   Down, Depressed, Hopeless 2 3 3   PHQ - 2 Score 3 6 5   Altered sleeping 3 1 0  Tired, decreased energy 1 3 3   Change in appetite 1 2 0  Feeling bad or failure about yourself  1 3 2   Trouble concentrating 1 1 1   Moving slowly or fidgety/restless 2 1 1   Suicidal thoughts 0 1 1  PHQ-9 Score 12 18 13   Difficult doing work/chores   Somewhat difficult    Flowsheet Row [facility] from 01/13/2024 in [facility] ED to Hosp-Admission (Discharged) from 11/16/2023 in [facility]  C-SSRS RISK CATEGORY Low Risk Low Risk No Risk       Musculoskeletal  Strength & Muscle Tone: within normal limits Gait & Station: normal Patient leans: N/A  Psychiatric Specialty Exam  Presentation  General Appearance:  Fairly Groomed  Eye Contact: Fair  Speech: Clear and Coherent  Speech Volume: Normal  Handedness: Right   Mood and Affect  Mood: Depressed  Affect: Congruent   Thought Process  Thought Processes: Coherent  Descriptions of Associations:Intact  Orientation:Full (Time, Place and Person)  Thought  Content:Logical  Diagnosis of Schizophrenia or Schizoaffective disorder in past: No    Hallucinations:Hallucinations: None  Ideas of Reference:None  Suicidal Thoughts:Suicidal Thoughts: No SI Passive Intent and/or Plan: Without Intent; Without Plan  Homicidal Thoughts:Homicidal Thoughts: No   Sensorium  Memory: Immediate Good  Judgment: Fair  Insight: Fair   Chartered certified accountant: Fair  Attention Span: Fair  Recall: Fair  Fund of Knowledge: Fair  Language: Fair   Psychomotor Activity  Psychomotor Activity: Psychomotor Activity: Normal   Assets  Assets: Resilience   Sleep  Sleep: Sleep: Poor Number of Hours of Sleep: 7   Nutritional Assessment (For OBS and FBC admissions only) Has the patient had a weight loss or gain of 10 pounds or more in the last 3 months?: No Has the patient had a decrease in food intake/or appetite?: No Does the patient have dental problems?: No Does the patient have eating habits or behaviors that may be indicators of an eating disorder including binging or inducing vomiting?: No Has the patient recently lost weight without trying?: 0 Has the patient been eating poorly because of a decreased appetite?: 0 Malnutrition Screening Tool Score: 0    Physical Exam  Physical Exam Vitals and nursing note reviewed.  Pulmonary:     Effort: Pulmonary effort is normal.  Musculoskeletal:        General: Normal range of motion.  Neurological:     General: No  focal deficit present.     Mental Status: He is oriented to person, place, and time.    Review of Systems  Psychiatric/Behavioral:  Positive for depression and substance abuse. Negative for hallucinations, memory loss and suicidal ideas. The patient has insomnia. The patient is not nervous/anxious.   All other systems reviewed and are negative.  Blood pressure (!) 144/96, pulse 63, temperature 98 F (36.7 C), temperature source Oral, resp. rate 18, SpO2  100%. There is no height or weight on file to calculate BMI.  Demographic Factors:  Male and Low socioeconomic status  Loss Factors: Financial problems/change in socioeconomic status  Historical Factors: Family history of mental illness or substance abuse  Risk Reduction Factors:   Sense of responsibility to family  Continued Clinical Symptoms:  Alcohol/Substance Abuse/Dependencies More than one psychiatric diagnosis Previous Psychiatric Diagnoses and Treatments  Cognitive Features That Contribute To Risk:  None    Plan Of Care/Follow-up recommendations:  Discharge home with plan to follow up at [facility], [place], [place]. Educated to walk in to make an appointment since they will not reschedule an appt given that he missed previous one. -Educated to follow up with his Primary care team for primary care services, and all other health conditions including elevated blood pressure. -Patient has been >3 months with no psychotropic medications. There is not enough time for us  to restart meds and monitor prior to discharge. We are deferring medications for his outpatient provider to start and manage. -Educated substance use cessation. -Education provided on the fact that if experiencing worsening of psychiatry symptoms including suicidal ideations, homicidal ideations, or having auditory/visual hallucinations, etc, to call 911, [phone], come back to this location, or go to the nearest ER. Pt verbalized understanding. Left hospital with no concerns.  Disposition: Discharge home and follow up with Arnold Lapine, [place]  Robet Chiquito, NP 05/27/2024, 11:05 AM

## 2024-05-27 NOTE — Discharge Instructions (Signed)
 Please use the attached list of resources if you choose to complete a rehab for substance abuse. Education provided on the fact that if experiencing worsening of psychiatry symptoms including suicidal ideations, homicidal ideations, or having auditory/visual hallucinations, etc, to call 911, [phone], come back to this location, or go to the nearest ER. Pt verbalized understanding.

## 2024-05-27 NOTE — ED Notes (Signed)
 Pt BP slightly elevated 144/96.  Pt received Cozaar  100 mg in Am medications.   Provider, Arzella Laurence, NP made aware. No new orders  Recommendation for pt to follow up with PCP in community

## 2024-05-27 NOTE — ED Notes (Signed)
 Pt presents sitting in recliner.  Pt offered breakfast at this time,  Pt argumentative about breakfast and breakfast options.    Pt denied current SI plan and intent.  Denied HI and A/V hallucinations

## 2024-05-27 NOTE — BH Assessment (Signed)
 Comprehensive Clinical Assessment (CCA) Note  05/27/2024 Matthew [name] [id number]  Disposition: Dorthea Gauze, NP, recommends continuous observation for safety and stabilization with psych reassessment in the AM.   Chief Complaint:  Chief Complaint  Patient presents with   Alcohol Problem   Alcohol Intoxication   Depression   Anxiety   Visit Diagnosis:  Major depressive disorder Alcohol Abuse   Matthew [name] is a 58 year old male presenting as a voluntary walk-in to [facility] due to SI and worsening depressive symptoms. Patient has history depression, anxiety, SI and alcohol abuse. Patient denied HI and psychosis.  Patient reports he is grieving his wife's death.  Patient reports his wife died in 06-18-21.  Patient reports fleeting thoughts of suicide with no plan.  Patient reports going on an alcohol binge once a day, when he drank 10 beers and used cocaine today.  Patient reports worsening depressive symptoms.  Patient reports getting meals.  And having a poor appetite for the past 2 to 3 weeks.  Patient last psych hospitalization was on 01/14/24 at [facility]. Patient denied prior suicide attempts and self-harming behaviors.   Patient does not have a therapist or a psychiatrist.  Patient reports he is out of his medications.  Patient reports getting his psych medications for depression and anxiety from his primary care physician.   Patient resides alone.  Patient was married for 23 years and widowed in 2021-06-18.  Patient had 2 children (18 and 20).  Patient reports having a good relationship with his children.  Patient has been on disability for 4 years due to heart medical condition.  Patient denies access to guns.  Patient was anxious and cooperative during assessment.  Patient seeking treatment.  Patient reported wanting to go to [facility] for inpatient treatment stating he has been there before and feeling that the groups helped him a lot.       CCA Screening, Triage and  Referral (STR)  Patient Reported Information How did you hear about us ? Legal System  What Is the Reason for Your Visit/Call Today? Summary: The following contact information was provided for future reference:  Landlord Joanna Muck): Phone number [phone] Belvia Boyer Nichola Barges): Phone number [phone] This information is available in case further collateral information or assistance is needed.  How Long Has This Been Causing You Problems? <Week  What Do You Feel Would Help You the Most Today? Treatment for Depression or other mood problem   Have You Recently Had Any Thoughts About Hurting Yourself? Yes  Are You Planning to Commit Suicide/Harm Yourself At This time? No   Flowsheet Row [facility]) [facility] from 01/13/2024 in [facility] ED to Hosp-Admission (Discharged) from 11/16/2023 in [facility]  C-SSRS RISK CATEGORY Low Risk Low Risk No Risk       Have you Recently Had Thoughts About Hurting Someone Marigene Shoulder? No  Are You Planning to Harm Someone at This Time? No  Explanation: n/a   Have You Used Any Alcohol or Drugs in the Past 24 Hours? No  How Long Ago Did You Use Drugs or Alcohol? N/a What Did You Use and How Much? Yes, cocaine. Patient states that he last used cocaine, last Friday, 1 gram.   Do You Currently Have a Therapist/Psychiatrist? No  Name of Therapist/Psychiatrist:  n/a  Have You Been Recently Discharged From Any Office Practice or Programs? No  Explanation of Discharge From Practice/Program: n/a  CCA Screening Triage Referral Assessment Type of Contact: Face-to-Face  Telemedicine Service Delivery:  n/a Is this Initial or Reassessment?  N/a Date Telepsych consult ordered in [facility]:   N/a Time Telepsych consult ordered in [facility]:   N/a Location of Assessment: [facility] Assessment Services  Provider Location: [facility] Assessment  Services   Collateral Involvement: none   Does Patient Have a Automotive engineer Guardian? No  Legal Guardian Contact Information: n/a  Copy of Legal Guardianship Form: -- (n/a)  Legal Guardian Notified of Arrival: -- (n/a)  Legal Guardian Notified of Pending Discharge: -- (n/a)  If Minor and Not Living with Parent(s), Who has Custody? n/a  Is CPS involved or ever been involved? Never  Is APS involved or ever been involved? Never   Patient Determined To Be At Risk for Harm To Self or Others Based on Review of Patient Reported Information or Presenting Complaint? No  Method: No Plan  Availability of Means: No access or NA  Intent: Vague intent or NA  Notification Required: No need or identified person  Additional Information for Danger to Others Potential: -- (n/a)  Additional Comments for Danger to Others Potential: n/a  Are There Guns or Other Weapons in Your Home? No  Types of Guns/Weapons: n/a  Are These Weapons Safely Secured?                            -- (n/a)  Who Could Verify You Are Able To Have These Secured: n/a  Do You Have any Outstanding Charges, Pending Court Dates, Parole/Probation? none reported  Contacted To Inform of Risk of Harm To Self or Others: Family/Significant Other:    Does Patient Present under Involuntary Commitment? No    [place] of Residence: [place]   Patient Currently Receiving the Following Services: Not Receiving Services   Determination of Need: Routine (7 days)   Options For Referral: Medication Management; Inpatient Hospitalization     CCA Biopsychosocial Patient Reported Schizophrenia/Schizoaffective Diagnosis in Past: No   Strengths: Pt is willing to participate in treatment   Mental Health Symptoms Depression:  Change in energy/activity; Increase/decrease in appetite; Hopelessness; Fatigue; Difficulty Concentrating; Sleep (too much or little); Tearfulness; Worthlessness   Duration of Depressive  symptoms: Duration of Depressive Symptoms: Greater than two weeks   Mania:  None   Anxiety:   Worrying; Difficulty concentrating; Tension; Sleep; Restlessness; Fatigue   Psychosis:  None   Duration of Psychotic symptoms:    Trauma:  None   Obsessions:  None   Compulsions:  None   Inattention:  None   Hyperactivity/Impulsivity:  None   Oppositional/Defiant Behaviors:  None   Emotional Irregularity:  None   Other Mood/Personality Symptoms:  n/a    Mental Status Exam Appearance and self-care  Stature:  Average   Weight:  Average weight   Clothing:  Neat/clean; Age-appropriate   Grooming:  Normal   Cosmetic use:  None   Posture/gait:  Normal   Motor activity:  Not Remarkable   Sensorium  Attention:  Normal   Concentration:  Normal   Orientation:  X5   Recall/memory:  Normal   Affect and Mood  Affect:  Anxious; Depressed   Mood:  Anxious; Depressed   Relating  Eye contact:  Normal   Facial expression:  Anxious; Depressed   Attitude toward examiner:  Cooperative   Thought and Language  Speech flow: Normal   Thought content:  Appropriate to Mood and Circumstances  Preoccupation:  None   Hallucinations:  None   Organization:  Coherent   [employer] of Knowledge:  Average   Intelligence:  Average   Abstraction:  Normal   Judgement:  Fair   Dance movement psychotherapist:  Adequate   Insight:  Fair   Decision Making:  Normal   Social Functioning  Social Maturity:  Responsible   Social Judgement:  Naive   Stress  Stressors:  Grief/losses; Housing; Transitions; Financial   Coping Ability:  Exhausted   Skill Deficits:  None   Supports:  Friends/Service system     Religion: Religion/Spirituality Are You A Religious Person?: No How Might This Affect Treatment?: n/a  Leisure/Recreation: Leisure / Recreation Do You Have Hobbies?: Yes Leisure and Hobbies: watching sports and fishing  Exercise/Diet: Exercise/Diet Do You  Exercise?: No Have You Gained or Lost A Significant Amount of Weight in the Past Six Months?: No Do You Follow a Special Diet?: No Do You Have Any Trouble Sleeping?: Yes Explanation of Sleeping Difficulties: no sleep   CCA Employment/Education Employment/Work Situation: Employment / Work Systems developer: On disability Why is Patient on Disability: heart condition How Long has Patient Been on Disability: 4 years Patient's Job has Been Impacted by Current Illness:  (n/a) Has Patient ever Been in the [employer]?: No  Education: Education Is Patient Currently Attending School?: No Last Grade Completed: 14 (12th grade) Did You Attend College?: Yes What Type of College Degree Do you Have?: 2 years college Did You Have An Individualized Education Program (IIEP): No Did You Have Any Difficulty At School?: No Patient's Education Has Been Impacted by Current Illness: No   CCA Family/Childhood History Family and Relationship History: Family history Marital status: Widowed Widowed, when?: 2022 Does patient have children?: Yes How many children?: 2 How is patient's relationship with their children?: good  Childhood History:  Childhood History By whom was/is the patient raised?: Mother Did patient suffer any verbal/emotional/physical/sexual abuse as a child?: No Did patient suffer from severe childhood neglect?: No Has patient ever been sexually abused/assaulted/raped as an adolescent or adult?: No Was the patient ever a victim of a crime or a disaster?: No Witnessed domestic violence?: No Has patient been affected by domestic violence as an adult?: No       CCA Substance Use Alcohol/Drug Use: Alcohol / Drug Use Pain Medications: See MAR Prescriptions: See MAR Over the Counter: See MAR History of alcohol / drug use?: Yes Longest period of sobriety (when/how long): Unknown Negative Consequences of Use: Work / Programmer, multimedia, Copywriter, advertising relationships Withdrawal  Symptoms: None                         ASAM's:  Six Dimensions of Multidimensional Assessment  Dimension 1:  Acute Intoxication and/or Withdrawal Potential:   Dimension 1:  Description of individual's past and current experiences of substance use and withdrawal: Binge drinking (Based on patient denying recent use ASAM was not completed)  Dimension 2:  Biomedical Conditions and Complications:   Dimension 2:  Description of patient's biomedical conditions and  complications: Heart condition  Dimension 3:  Emotional, Behavioral, or Cognitive Conditions and Complications:  Dimension 3:  Description of emotional, behavioral, or cognitive conditions and complications: Worsening depression symptoms  Dimension 4:  Readiness to Change:  Dimension 4:  Description of Readiness to Change criteria: Seeking treatment  Dimension 5:  Relapse, Continued use, or Continued Problem Potential:  Dimension 5:  Relapse, continued use, or continued problem  potential critiera description: Continued usage  Dimension 6:  Recovery/Living Environment:  Dimension 6:  Recovery/Iiving environment criteria description: Resides alone  ASAM Severity Score: ASAM's Severity Rating Score: 8  ASAM Recommended Level of Treatment: ASAM Recommended Level of Treatment: Level III Residential Treatment   Substance use Disorder (SUD) Substance Use Disorder (SUD)  Checklist Symptoms of Substance Use: Continued use despite having a persistent/recurrent physical/psychological problem caused/exacerbated by use, Continued use despite persistent or recurrent social, interpersonal problems, caused or exacerbated by use, Presence of craving or strong urge to use, Repeated use in physically hazardous situations, Recurrent use that results in a failure to fulfill major role obligations (work, school, home), Social, occupational, recreational activities given up or reduced due to use, Persistent desire or unsuccessful efforts to cut down or  control use, Evidence of tolerance, Evidence of withdrawal (Comment)  Recommendations for Services/Supports/Treatments: Recommendations for Services/Supports/Treatments Recommendations For Services/Supports/Treatments: Medication Management, Inpatient Hospitalization, Individual Therapy, Other (Comment)  Disposition Recommendation per psychiatric provider:  Recommends continuous observation   DSM5 Diagnoses: Patient Active Problem List   Diagnosis Date Noted   MDD (major depressive disorder) 01/14/2024   Panic disorder 01/14/2024   Cocaine use disorder, severe, dependence (HCC) 01/14/2024   Acute on chronic hypoxic respiratory failure (HCC) 11/16/2023   Acute pulmonary edema (HCC) 11/16/2023   Cocaine use 11/16/2023   Acute hypoxic respiratory failure (HCC) 11/16/2023   GAD (generalized anxiety disorder) 06/28/2022   Cocaine abuse (HCC) 06/28/2022   MDD (major depressive disorder), recurrent episode (HCC) 04/21/2022   MDD (major depressive disorder), recurrent, severe, with psychosis (HCC) 02/16/2022   MDD (major depressive disorder), recurrent episode, severe (HCC) 02/15/2022   Impacted cerumen of right ear 03/24/2021   MDD (major depressive disorder), recurrent severe, without psychosis (HCC) 01/03/2021   Decompensated heart failure (HCC) 12/24/2020   Acute respiratory failure with hypoxia (HCC) 02/15/2020   Acute on chronic combined systolic and diastolic CHF (congestive heart failure) (HCC) 02/15/2020   Hypertensive urgency 02/15/2020   CHF (congestive heart failure) (HCC) 02/15/2020   Depression due to physical illness 11/18/2018   Expressive aphasia 02/20/2017   Mixed hyperlipidemia    History of stroke 01/31/2017   Erectile dysfunction 07/30/2016   OSA (obstructive sleep apnea) 05/10/2015   CKD (chronic kidney disease) stage 2, GFR 60-89 ml/min 03/10/2014   Diabetes mellitus type II, controlled (HCC) 03/10/2014   Chronic combined systolic and diastolic congestive heart  failure (HCC) 03/03/2014   Nicotine  use disorder    Morbid obesity (HCC)    Hypertension 03/02/2014     Referrals to Alternative Service(s): Referred to Alternative Service(s):   Place:   Date:   Time:    Referred to Alternative Service(s):   Place:   Date:   Time:    Referred to Alternative Service(s):   Place:   Date:   Time:    Referred to Alternative Service(s):   Place:   Date:   Time:     Adelfa Adolph, [facility]

## 2024-06-28 ENCOUNTER — Inpatient Hospital Stay (HOSPITAL_COMMUNITY)
Admission: EM | Admit: 2024-06-28 | Discharge: 2024-06-30 | DRG: 291 | Disposition: A | Discharge status: Home / Self Care | Attending: Student | Admitting: Student

## 2024-06-28 ENCOUNTER — Emergency Department (HOSPITAL_COMMUNITY)

## 2024-06-28 DIAGNOSIS — R0603 Acute respiratory distress: Secondary | ICD-10-CM | POA: Diagnosis present

## 2024-06-28 DIAGNOSIS — I5023 Acute on chronic systolic (congestive) heart failure: Secondary | ICD-10-CM

## 2024-06-28 DIAGNOSIS — I130 Hypertensive heart and chronic kidney disease with heart failure and stage 1 through stage 4 chronic kidney disease, or unspecified chronic kidney disease: Principal | ICD-10-CM | POA: Diagnosis present

## 2024-06-28 DIAGNOSIS — N182 Chronic kidney disease, stage 2 (mild): Secondary | ICD-10-CM | POA: Diagnosis present

## 2024-06-28 DIAGNOSIS — N1831 Chronic kidney disease, stage 3a: Secondary | ICD-10-CM | POA: Diagnosis present

## 2024-06-28 DIAGNOSIS — E7800 Pure hypercholesterolemia, unspecified: Secondary | ICD-10-CM | POA: Diagnosis present

## 2024-06-28 DIAGNOSIS — Z79899 Other long term (current) drug therapy: Secondary | ICD-10-CM

## 2024-06-28 DIAGNOSIS — Z91148 Patient's other noncompliance with medication regimen for other reason: Secondary | ICD-10-CM | POA: Diagnosis not present

## 2024-06-28 DIAGNOSIS — I5021 Acute systolic (congestive) heart failure: Secondary | ICD-10-CM | POA: Diagnosis not present

## 2024-06-28 DIAGNOSIS — Z716 Tobacco abuse counseling: Secondary | ICD-10-CM | POA: Diagnosis not present

## 2024-06-28 DIAGNOSIS — Z1152 Encounter for screening for COVID-19: Secondary | ICD-10-CM

## 2024-06-28 DIAGNOSIS — I10 Essential (primary) hypertension: Secondary | ICD-10-CM | POA: Diagnosis present

## 2024-06-28 DIAGNOSIS — Z8249 Family history of ischemic heart disease and other diseases of the circulatory system: Secondary | ICD-10-CM | POA: Diagnosis not present

## 2024-06-28 DIAGNOSIS — G4700 Insomnia, unspecified: Secondary | ICD-10-CM | POA: Diagnosis present

## 2024-06-28 DIAGNOSIS — Z7151 Drug abuse counseling and surveillance of drug abuser: Secondary | ICD-10-CM | POA: Diagnosis not present

## 2024-06-28 DIAGNOSIS — J810 Acute pulmonary edema: Secondary | ICD-10-CM | POA: Diagnosis present

## 2024-06-28 DIAGNOSIS — E1122 Type 2 diabetes mellitus with diabetic chronic kidney disease: Secondary | ICD-10-CM | POA: Diagnosis present

## 2024-06-28 DIAGNOSIS — T383X6A Underdosing of insulin and oral hypoglycemic [antidiabetic] drugs, initial encounter: Secondary | ICD-10-CM | POA: Diagnosis present

## 2024-06-28 DIAGNOSIS — Z8673 Personal history of transient ischemic attack (TIA), and cerebral infarction without residual deficits: Secondary | ICD-10-CM

## 2024-06-28 DIAGNOSIS — I160 Hypertensive urgency: Secondary | ICD-10-CM | POA: Diagnosis present

## 2024-06-28 DIAGNOSIS — H5461 Unqualified visual loss, right eye, normal vision left eye: Secondary | ICD-10-CM | POA: Diagnosis present

## 2024-06-28 DIAGNOSIS — I429 Cardiomyopathy, unspecified: Secondary | ICD-10-CM | POA: Diagnosis present

## 2024-06-28 DIAGNOSIS — I5043 Acute on chronic combined systolic (congestive) and diastolic (congestive) heart failure: Secondary | ICD-10-CM | POA: Diagnosis present

## 2024-06-28 DIAGNOSIS — E119 Type 2 diabetes mellitus without complications: Secondary | ICD-10-CM

## 2024-06-28 DIAGNOSIS — F419 Anxiety disorder, unspecified: Secondary | ICD-10-CM

## 2024-06-28 DIAGNOSIS — G4733 Obstructive sleep apnea (adult) (pediatric): Secondary | ICD-10-CM | POA: Diagnosis present

## 2024-06-28 DIAGNOSIS — F1420 Cocaine dependence, uncomplicated: Secondary | ICD-10-CM | POA: Diagnosis present

## 2024-06-28 DIAGNOSIS — I110 Hypertensive heart disease with heart failure: Secondary | ICD-10-CM

## 2024-06-28 DIAGNOSIS — F17210 Nicotine dependence, cigarettes, uncomplicated: Secondary | ICD-10-CM | POA: Diagnosis present

## 2024-06-28 DIAGNOSIS — Z91128 Patient's intentional underdosing of medication regimen for other reason: Secondary | ICD-10-CM | POA: Diagnosis not present

## 2024-06-28 DIAGNOSIS — F411 Generalized anxiety disorder: Secondary | ICD-10-CM

## 2024-06-28 DIAGNOSIS — I5042 Chronic combined systolic (congestive) and diastolic (congestive) heart failure: Secondary | ICD-10-CM | POA: Diagnosis present

## 2024-06-28 DIAGNOSIS — Z7984 Long term (current) use of oral hypoglycemic drugs: Secondary | ICD-10-CM | POA: Diagnosis not present

## 2024-06-28 DIAGNOSIS — E1169 Type 2 diabetes mellitus with other specified complication: Secondary | ICD-10-CM

## 2024-06-28 DIAGNOSIS — H538 Other visual disturbances: Secondary | ICD-10-CM

## 2024-06-28 DIAGNOSIS — I509 Heart failure, unspecified: Secondary | ICD-10-CM

## 2024-06-28 LAB — CBC WITH DIFFERENTIAL/PLATELET
Abs Immature Granulocytes: 0.01 K/uL (ref 0.00–0.07)
Basophils Absolute: 0 K/uL (ref 0.0–0.1)
Basophils Relative: 1 %
Eosinophils Absolute: 0.1 K/uL (ref 0.0–0.5)
Eosinophils Relative: 2 %
HCT: 40.8 % (ref 39.0–52.0)
Hemoglobin: 13.4 g/dL (ref 13.0–17.0)
Immature Granulocytes: 0 %
Lymphocytes Relative: 34 %
Lymphs Abs: 1.6 K/uL (ref 0.7–4.0)
MCH: 30.1 pg (ref 26.0–34.0)
MCHC: 32.8 g/dL (ref 30.0–36.0)
MCV: 91.7 fL (ref 80.0–100.0)
Monocytes Absolute: 0.4 K/uL (ref 0.1–1.0)
Monocytes Relative: 8 %
Neutro Abs: 2.5 K/uL (ref 1.7–7.7)
Neutrophils Relative %: 55 %
Platelets: 233 K/uL (ref 150–400)
RBC: 4.45 MIL/uL (ref 4.22–5.81)
RDW: 13.2 % (ref 11.5–15.5)
WBC: 4.6 K/uL (ref 4.0–10.5)
nRBC: 0 % (ref 0.0–0.2)

## 2024-06-28 LAB — RESP PANEL BY RT-PCR (RSV, FLU A&B, COVID)  RVPGX2
Influenza A by PCR: NEGATIVE
Influenza B by PCR: NEGATIVE
Resp Syncytial Virus by PCR: NEGATIVE
SARS Coronavirus 2 by RT PCR: NEGATIVE

## 2024-06-28 LAB — COMPREHENSIVE METABOLIC PANEL WITH GFR
ALT: 32 U/L (ref 0–44)
AST: 33 U/L (ref 15–41)
Albumin: 3.1 g/dL — ABNORMAL LOW (ref 3.5–5.0)
Alkaline Phosphatase: 69 U/L (ref 38–126)
Anion gap: 5 (ref 5–15)
BUN: 19 mg/dL (ref 6–20)
CO2: 25 mmol/L (ref 22–32)
Calcium: 8.9 mg/dL (ref 8.9–10.3)
Chloride: 109 mmol/L (ref 98–111)
Creatinine, Ser: 1.44 mg/dL — ABNORMAL HIGH (ref 0.61–1.24)
GFR, Estimated: 57 mL/min — ABNORMAL LOW (ref >60)
Glucose, Bld: 99 mg/dL (ref 70–99)
Potassium: 4.4 mmol/L (ref 3.5–5.1)
Sodium: 139 mmol/L (ref 135–145)
Total Bilirubin: 0.4 mg/dL (ref 0.0–1.2)
Total Protein: 6.1 g/dL — ABNORMAL LOW (ref 6.5–8.1)

## 2024-06-28 LAB — BRAIN NATRIURETIC PEPTIDE: B Natriuretic Peptide: 627.8 pg/mL — ABNORMAL HIGH (ref 0.0–100.0)

## 2024-06-28 MED ORDER — SPIRONOLACTONE 25 MG PO TABS
25.0000 mg | ORAL_TABLET | Freq: Every day | ORAL | Status: DC
  Administered 2024-06-28 – 2024-06-30 (×3): 25 mg via ORAL
  Filled 2024-06-28: qty 2
  Filled 2024-06-28 (×2): qty 1

## 2024-06-28 MED ORDER — SORBITOL 70 % SOLN: 30.0000 mL | Freq: Every day | Status: DC | PRN

## 2024-06-28 MED ORDER — ONDANSETRON HCL 4 MG/2ML IJ SOLN: 4.0000 mg | Freq: Four times a day (QID) | INTRAMUSCULAR | Status: DC | PRN

## 2024-06-28 MED ORDER — ALBUTEROL SULFATE (2.5 MG/3ML) 0.083% IN NEBU: 2.5000 mg | INHALATION_SOLUTION | RESPIRATORY_TRACT | Status: DC | PRN

## 2024-06-28 MED ORDER — NITROGLYCERIN 2 % TD OINT
1.0000 [in_us] | TOPICAL_OINTMENT | Freq: Four times a day (QID) | TRANSDERMAL | Status: AC
  Administered 2024-06-29 (×2): 1 [in_us] via TOPICAL
  Filled 2024-06-28 (×2): qty 1

## 2024-06-28 MED ORDER — FUROSEMIDE 10 MG/ML IJ SOLN
40.0000 mg | Freq: Once | INTRAMUSCULAR | Status: AC
  Administered 2024-06-28: 40 mg via INTRAVENOUS
  Filled 2024-06-28: qty 4

## 2024-06-28 MED ORDER — ASPIRIN 81 MG PO CHEW
324.0000 mg | CHEWABLE_TABLET | Freq: Once | ORAL | Status: AC
  Administered 2024-06-28: 324 mg via ORAL
  Filled 2024-06-28: qty 4

## 2024-06-28 MED ORDER — LOSARTAN POTASSIUM 50 MG PO TABS
100.0000 mg | ORAL_TABLET | Freq: Every day | ORAL | Status: DC
  Administered 2024-06-28 – 2024-06-30 (×3): 100 mg via ORAL
  Filled 2024-06-28 (×3): qty 2

## 2024-06-28 MED ORDER — ACETAMINOPHEN 650 MG RE SUPP: 650.0000 mg | Freq: Four times a day (QID) | RECTAL | Status: DC | PRN

## 2024-06-28 MED ORDER — ENOXAPARIN SODIUM 40 MG/0.4ML IJ SOSY
40.0000 mg | PREFILLED_SYRINGE | Freq: Every day | INTRAMUSCULAR | Status: DC
  Administered 2024-06-29 – 2024-06-30 (×2): 40 mg via SUBCUTANEOUS
  Filled 2024-06-28 (×2): qty 0.4

## 2024-06-28 MED ORDER — ALBUTEROL SULFATE (2.5 MG/3ML) 0.083% IN NEBU
5.0000 mg | INHALATION_SOLUTION | Freq: Once | RESPIRATORY_TRACT | Status: AC
  Administered 2024-06-28: 5 mg via RESPIRATORY_TRACT
  Filled 2024-06-28: qty 6

## 2024-06-28 MED ORDER — FUROSEMIDE 10 MG/ML IJ SOLN
40.0000 mg | Freq: Two times a day (BID) | INTRAMUSCULAR | Status: DC
  Administered 2024-06-29 – 2024-06-30 (×3): 40 mg via INTRAVENOUS
  Filled 2024-06-28 (×3): qty 4

## 2024-06-28 MED ORDER — NITROGLYCERIN 2 % TD OINT
0.5000 [in_us] | TOPICAL_OINTMENT | Freq: Once | TRANSDERMAL | Status: AC
  Administered 2024-06-28: 0.5 [in_us] via TOPICAL
  Filled 2024-06-28: qty 1

## 2024-06-28 MED ORDER — ACETAMINOPHEN 325 MG PO TABS
650.0000 mg | ORAL_TABLET | Freq: Four times a day (QID) | ORAL | Status: DC | PRN
Start: 2024-06-28 — End: 2024-06-30

## 2024-06-28 MED ORDER — ONDANSETRON HCL 4 MG PO TABS
4.0000 mg | ORAL_TABLET | Freq: Four times a day (QID) | ORAL | Status: DC | PRN
Start: 2024-06-28 — End: 2024-06-30

## 2024-06-28 MED ORDER — LORAZEPAM 2 MG/ML IJ SOLN
1.0000 mg | Freq: Once | INTRAMUSCULAR | Status: AC
  Administered 2024-06-28: 1 mg via INTRAVENOUS
  Filled 2024-06-28: qty 1

## 2024-06-28 MED ORDER — MELATONIN 3 MG PO TABS
3.0000 mg | ORAL_TABLET | Freq: Every day | ORAL | Status: DC
  Administered 2024-06-28 – 2024-06-29 (×2): 3 mg via ORAL
  Filled 2024-06-28 (×2): qty 1

## 2024-06-28 NOTE — H&P (Addendum)
 History and Physical    Patient: Matthew [name]. FMW:[id number] DOB: 1966-09-09 DOA: 06/28/2024 DOS: the patient was seen and examined on 06/28/2024 PCP: [name], MD  Patient coming from: Home  Chief Complaint:  Chief Complaint  Patient presents with   Shortness of Breath   HPI: Matthew [name]. is a 58 y.o. male with medical history significant for obstructive sleep apnea, diabetes mellitus type 2, combined systolic and diastolic congestive heart failure EF=25% 11/2023, cocaine abuse, OSA and hypertension who presents with severe sob.  He called 911 himself which he has never done before.  He denies fevers or chills or chest pain.  He says he started to feel more short of breath a couple days ago.  He really struggles when he first wakes up in the morning and it has just been getting worse.  He has been out of his medication for at least a month.  He last used cocaine a week ago.   He could not breathe at all today which is why he called.  He says this has happened to him before.  Review of Systems: As mentioned in the history of present illness. All other systems reviewed and are negative. Past Medical History:  Diagnosis Date   Acute combined systolic and diastolic CHF, NYHA class 3 (HCC)    a. 02/2014 Echo: EF 25-30%.   Blindness of right eye    Cardiomyopathy (HCC)    a. 02/2014 Echo: EF 25-30%, sev glob HK with inferolat HK->AK, mod conc LVH, Gr 2 DD, Mild MR, sev dil LA.   CKD (chronic kidney disease), stage III (HCC)    Cocaine abuse (HCC)    Depression    DM (diabetes mellitus), type 2 (HCC)    GAD (generalized anxiety disorder)    HFrEF (heart failure with reduced ejection fraction) (HCC)    High cholesterol    Hypertension    Morbid obesity (HCC)    OSA (obstructive sleep apnea)    Stroke (HCC)    Tobacco abuse    Past Surgical History:  Procedure Laterality Date   CATARACT EXTRACTION Right    LEFT HEART CATHETERIZATION WITH CORONARY ANGIOGRAM N/A  03/04/2014   Procedure: LEFT HEART CATHETERIZATION WITH CORONARY ANGIOGRAM;  Surgeon: Lonni JONETTA Cash, MD;  Location: [facility] CATH LAB;  Service: Cardiovascular;  Laterality: N/A;   TEE WITHOUT CARDIOVERSION N/A 02/04/2017   Procedure: TRANSESOPHAGEAL ECHOCARDIOGRAM (TEE);  Surgeon: Leim VEAR Moose, MD;  Location: [facility] ENDOSCOPY;  Service: Cardiovascular;  Laterality: N/A;   Social History:  reports that he has been smoking cigarettes. He quit smokeless tobacco use about 3 years ago. He reports current drug use. Drug: Cocaine. He reports that he does not drink alcohol.  No Known Allergies  Family History  Problem Relation Age of Onset   Heart attack Father        died @ 32   Cervical cancer Mother        died @ 88    Prior to Admission medications   Medication Sig Start Date End Date Taking? Authorizing Provider  atorvastatin  (LIPITOR ) 80 MG tablet Take 1 tablet (80 mg total) by mouth daily. 01/22/24   Danton Jon HERO, PA-C  ezetimibe  (ZETIA ) 10 MG tablet Take 1 tablet (10 mg total) by mouth daily. 01/22/24 05/27/24  Danton Jon HERO, PA-C  furosemide  (LASIX ) 40 MG tablet Take 1 tablet (40 mg total) by mouth daily. 01/22/24   Danton Jon HERO, PA-C  gabapentin  (NEURONTIN ) 100 MG capsule  Take 1 capsule (100 mg total) by mouth every 8 (eight) hours. 01/22/24   Danton Jon HERO, PA-C  hydrocortisone  cream 1 % Apply topically 3 (three) times daily. Patient not taking: Reported on 05/27/2024 01/17/24   [name], MD  hydrOXYzine  (ATARAX ) 10 MG tablet Take 1 tablet (10 mg total) by mouth 3 (three) times daily as needed for itching or anxiety. Patient not taking: Reported on 05/27/2024 01/17/24   [name], MD  losartan  (COZAAR ) 100 MG tablet Take 1 tablet (100 mg total) by mouth daily. 01/22/24   Danton Jon HERO, PA-C  melatonin 5 MG TABS Take 1 tablet (5 mg total) by mouth at bedtime. 01/17/24   Massengill, Lot, MD  metFORMIN  (GLUCOPHAGE -XR) 500 MG 24 hr tablet Take 1 tablet (500  mg total) by mouth daily with breakfast. 01/22/24   Danton Jon HERO, PA-C  sertraline  (ZOLOFT ) 50 MG tablet Take 3 tablets (150 mg total) by mouth daily. Patient not taking: Reported on 05/27/2024 01/22/24   Danton Jon HERO, PA-C  spironolactone  (ALDACTONE ) 25 MG tablet Take 1 tablet (25 mg total) by mouth daily. 01/22/24   McClung, Angela M, PA-C  timolol  (TIMOPTIC ) 0.5 % ophthalmic solution Place 1 drop into both eyes daily after breakfast. 01/17/24   Massengill, Lot, MD  lovastatin  (MEVACOR ) 20 MG tablet Take 2 tablets (40 mg total) by mouth at bedtime. 06/24/14 08/26/14  Colton Drape, MD    Physical Exam: Vitals:   06/28/24 1848 06/28/24 1851 06/28/24 2156  BP:  (!) 168/120   Pulse: 77    Resp: 16    Temp: 98.3 F (36.8 C)  98 F (36.7 C)  TempSrc: Oral  Oral  SpO2: 100%     Physical Exam:  General: in distress, malnourished HEENT: Normocephalic, atraumatic, PERRL Cardiovascular: Normal rhythm.  Tachycardic. distal pulses intact. Pulmonary: Tachypneic. Poor air movement. Fine crackles left> right anteriorly.  End exp squeek Gastrointestinal: Nondistended abdomen, soft, non-tender, hypoactive bowel sounds Musculoskeletal:Normal ROM, no lower ext edema Lymphadenopathy: No cervical LAD. Skin: Skin is warm and dry. Neuro: No focal deficits noted, AAOx3. PSYCH: Attentive and cooperative  Data Reviewed:  Results for orders placed or performed during the hospital encounter of 06/28/24 (from the past 24 hours)  Brain natriuretic peptide     Status: Abnormal   Collection Time: 06/28/24  7:01 PM  Result Value Ref Range   B Natriuretic Peptide 627.8 (H) 0.0 - 100.0 pg/mL  Comprehensive metabolic panel     Status: Abnormal   Collection Time: 06/28/24  7:09 PM  Result Value Ref Range   Sodium 139 135 - 145 mmol/L   Potassium 4.4 3.5 - 5.1 mmol/L   Chloride 109 98 - 111 mmol/L   CO2 25 22 - 32 mmol/L   Glucose, Bld 99 70 - 99 mg/dL   BUN 19 6 - 20 mg/dL   Creatinine, Ser 8.55 (H)  0.61 - 1.24 mg/dL   Calcium  8.9 8.9 - 10.3 mg/dL   Total Protein 6.1 (L) 6.5 - 8.1 g/dL   Albumin 3.1 (L) 3.5 - 5.0 g/dL   AST 33 15 - 41 U/L   ALT 32 0 - 44 U/L   Alkaline Phosphatase 69 38 - 126 U/L   Total Bilirubin 0.4 0.0 - 1.2 mg/dL   GFR, Estimated 57 (L) >60 mL/min   Anion gap 5 5 - 15  CBC with Differential/Platelet     Status: None   Collection Time: 06/28/24  7:09 PM  Result Value Ref Range  WBC 4.6 4.0 - 10.5 K/uL   RBC 4.45 4.22 - 5.81 MIL/uL   Hemoglobin 13.4 13.0 - 17.0 g/dL   HCT 59.1 60.9 - 47.9 %   MCV 91.7 80.0 - 100.0 fL   MCH 30.1 26.0 - 34.0 pg   MCHC 32.8 30.0 - 36.0 g/dL   RDW 86.7 88.4 - 84.4 %   Platelets 233 150 - 400 K/uL   nRBC 0.0 0.0 - 0.2 %   Neutrophils Relative % 55 %   Neutro Abs 2.5 1.7 - 7.7 K/uL   Lymphocytes Relative 34 %   Lymphs Abs 1.6 0.7 - 4.0 K/uL   Monocytes Relative 8 %   Monocytes Absolute 0.4 0.1 - 1.0 K/uL   Eosinophils Relative 2 %   Eosinophils Absolute 0.1 0.0 - 0.5 K/uL   Basophils Relative 1 %   Basophils Absolute 0.0 0.0 - 0.1 K/uL   Immature Granulocytes 0 %   Abs Immature Granulocytes 0.01 0.00 - 0.07 K/uL     Assessment and Plan: 1.Acute respiratory distress 2.Severe CHF exacerbation 3.Uncontrolled hypertension - He continues to be significantly short of breath and tachypneic despite excellent diuresis - Consider CPAP - Continue nitro patch - Echo in am - check troponin - Restart Losartan , spironolactone  - Records report he did not tolerate Entresto  or Farxiga  in the past - He was on Coreg  because of his history of cocaine abuse.  Will restart that once he is more stable  4. DMT2 -patient was not taking any of his medications but looks like he was prescribed metformin  for your diabetes.  Will monitor blood sugars with corrective dose insulin  as needed.  Will not restart metformin  until he is more stable.  5. OSA -the patient reports that he needs a new sleep study appointment.   He will be on CPAP likely  for acute shortness of breath but will continue that nightly while hospitalized.  6. CKD -creatinine appears to be at baseline at 1.4.   Advance Care Planning:   Code Status: Prior he names Ronal Pouch as his surrogate decision maker and he wants to be full code.  Consults: none  Family Communication: None  Severity of Illness: The appropriate patient status for this patient is INPATIENT. Inpatient status is judged to be reasonable and necessary in order to provide the required intensity of service to ensure the patient's safety. The patient's presenting symptoms, physical exam findings, and initial radiographic and laboratory data in the context of their chronic comorbidities is felt to place them at high risk for further clinical deterioration. Furthermore, it is not anticipated that the patient will be medically stable for discharge from the hospital within 2 midnights of admission.   * I certify that at the point of admission it is my clinical judgment that the patient will require inpatient hospital care spanning beyond 2 midnights from the point of admission due to high intensity of service, high risk for further deterioration and high frequency of surveillance required.*  Author: ARTHEA CHILD, MD 06/28/2024 10:18 PM  For on call review [email or web address].

## 2024-06-28 NOTE — ED Triage Notes (Signed)
 BIB [facility] from home for increased shortness of breath since this morning, Patient has a history of CHF, and is not on home oxygen. Patient is A/Ox4.   BP 170/120 RR 30 HR 80 93% RA ----> 99% 2L [place]

## 2024-06-28 NOTE — ED Provider Notes (Signed)
 [name] EMERGENCY DEPARTMENT AT [facility] Provider Note   CSN: [id number] Arrival date & time: 06/28/24  1845     Patient presents with: Shortness of Breath   Matthew [name]. is a 58 y.o. male.   HPI Patient with a history of CHF, cocaine use presents with chest pain, shortness of breath.  Onset earlier today, since onset symptoms have worsened, no fever, no vomiting, no fall, no syncope.  He states that he has not been taking his medication.  He ran out of his medication, possibly 1 week ago, possibly earlier.     Prior to Admission medications   Medication Sig Start Date End Date Taking? Authorizing Provider  atorvastatin  (LIPITOR ) 80 MG tablet Take 1 tablet (80 mg total) by mouth daily. 01/22/24   Danton Jon HERO, PA-C  ezetimibe  (ZETIA ) 10 MG tablet Take 1 tablet (10 mg total) by mouth daily. 01/22/24 05/27/24  Danton Jon HERO, PA-C  furosemide  (LASIX ) 40 MG tablet Take 1 tablet (40 mg total) by mouth daily. 01/22/24   Danton Jon HERO, PA-C  gabapentin  (NEURONTIN ) 100 MG capsule Take 1 capsule (100 mg total) by mouth every 8 (eight) hours. 01/22/24   Danton Jon HERO, PA-C  hydrocortisone  cream 1 % Apply topically 3 (three) times daily. Patient not taking: Reported on 05/27/2024 01/17/24   [name], MD  hydrOXYzine  (ATARAX ) 10 MG tablet Take 1 tablet (10 mg total) by mouth 3 (three) times daily as needed for itching or anxiety. Patient not taking: Reported on 05/27/2024 01/17/24   [name], MD  losartan  (COZAAR ) 100 MG tablet Take 1 tablet (100 mg total) by mouth daily. 01/22/24   Danton Jon HERO, PA-C  melatonin 5 MG TABS Take 1 tablet (5 mg total) by mouth at bedtime. 01/17/24   Massengill, Lot, MD  metFORMIN  (GLUCOPHAGE -XR) 500 MG 24 hr tablet Take 1 tablet (500 mg total) by mouth daily with breakfast. 01/22/24   Danton Jon HERO, PA-C  sertraline  (ZOLOFT ) 50 MG tablet Take 3 tablets (150 mg total) by mouth daily. Patient not taking: Reported  on 05/27/2024 01/22/24   Danton Jon HERO, PA-C  spironolactone  (ALDACTONE ) 25 MG tablet Take 1 tablet (25 mg total) by mouth daily. 01/22/24   McClung, Angela M, PA-C  timolol  (TIMOPTIC ) 0.5 % ophthalmic solution Place 1 drop into both eyes daily after breakfast. 01/17/24   Massengill, Lot, MD  lovastatin  (MEVACOR ) 20 MG tablet Take 2 tablets (40 mg total) by mouth at bedtime. 06/24/14 08/26/14  Advani, Deepak, MD    Allergies: Patient has no known allergies.    Review of Systems  Updated Vital Signs BP (!) 168/120   Pulse 77   Temp 98 F (36.7 C) (Oral)   Resp 16   SpO2 100%   Physical Exam Vitals and nursing note reviewed.  Constitutional:      General: He is in acute distress.     Appearance: He is well-developed. He is ill-appearing and diaphoretic.  HENT:     Head: Normocephalic and atraumatic.  Eyes:     Conjunctiva/sclera: Conjunctivae normal.  Cardiovascular:     Rate and Rhythm: Normal rate and regular rhythm.  Pulmonary:     Effort: Tachypnea present.     Breath sounds: No stridor.  Abdominal:     General: There is no distension.  Skin:    General: Skin is warm.  Neurological:     Mental Status: He is alert and oriented to person, place, and time.     (  all labs ordered are listed, but only abnormal results are displayed) Labs Reviewed  COMPREHENSIVE METABOLIC PANEL WITH GFR - Abnormal; Notable for the following components:      Result Value   Creatinine, Ser 1.44 (*)    Total Protein 6.1 (*)    Albumin 3.1 (*)    GFR, Estimated 57 (*)    All other components within normal limits  BRAIN NATRIURETIC PEPTIDE - Abnormal; Notable for the following components:   B Natriuretic Peptide 627.8 (*)    All other components within normal limits  RESP PANEL BY RT-PCR (RSV, FLU A&B, COVID)  [id number]  CBC WITH DIFFERENTIAL/PLATELET    EKG: EKG Interpretation Date/Time:  Sunday June 28 2024 18:53:00 EDT Ventricular Rate:  78 PR Interval:  186 QRS Duration:  108 QT  Interval:  402 QTC Calculation: 458 R Axis:   74  Text Interpretation: Sinus rhythm Probable left atrial enlargement Left ventricular hypertrophy T wave abnormality No significant change since last tracing Abnormal ECG Confirmed by Garrick Charleston [phone]) on 06/28/2024 7:12:06 PM  Radiology: [facility] Chest Port 1 View Result Date: 06/28/2024 CLINICAL DATA:  Shortness of breath EXAM: PORTABLE CHEST 1 VIEW COMPARISON:  11/16/2023 FINDINGS: Cardiac shadow is prominent but accentuated by the frontal technique. Lungs are well aerated bilaterally. Central vascular congestion is noted with mild interstitial edema consistent with CHF. No sizable effusion is seen. No bony abnormality is noted. IMPRESSION: Changes consistent with CHF. Electronically Signed   By: Oneil Devonshire M.D.   On: 06/28/2024 19:19     Procedures   Medications Ordered in the ED  losartan  (COZAAR ) tablet 100 mg (100 mg Oral Given 06/28/24 2101)  spironolactone  (ALDACTONE ) tablet 25 mg (25 mg Oral Given 06/28/24 2101)  albuterol  (PROVENTIL ) (2.5 MG/3ML) 0.083% nebulizer solution 5 mg (5 mg Nebulization Given 06/28/24 2006)  LORazepam  (ATIVAN ) injection 1 mg (1 mg Intravenous Given 06/28/24 2000)  furosemide  (LASIX ) injection 40 mg (40 mg Intravenous Given 06/28/24 2102)  nitroGLYCERIN  (NITROGLYN) 2 % ointment 0.5 inch (0.5 inches Topical Given 06/28/24 2100)                                    Medical Decision Making Patient with history of CHF, cocaine use, nicotine  abuse presents with respiratory distress, after running out of his medication about 1 week ago.  Broad differential including fluid overload, pulmonary congestion secondary to cocaine use, ACS, pneumonia. Cardiac 75 sinus normal pulse ox 100% 2 L nasal cannula abnormal  Amount and/or Complexity of Data Reviewed External Data Reviewed: notes. Labs: ordered. Decision-making details documented in ED Course. Radiology: ordered and independent interpretation performed.  Decision-making details documented in ED Course. ECG/medicine tests: ordered and independent interpretation performed. Decision-making details documented in ED Course.  Risk Prescription drug management. Decision regarding hospitalization. Diagnosis or treatment significantly limited by social determinants of health.   2024 ECHO FINDINGS   Left Ventricle: Spontaneous contrast noted in the LV apex with contrast  swirling indicating slow flow and high-risk for thrombus formation, but no  definite thrombus seen. Left ventricular ejection fraction, by estimation,  is 25 to 30%. The left  ventricle has severely decreased function. The left ventricle demonstrates  global hypokinesis. The left ventricular internal cavity size was severely  dilated. There is no left ventricular hypertrophy. Left ventricular  diastolic parameters are consistent  with Grade II diastolic dysfunction (pseudonormalization).    9:57 PM Patient beginning to  diurese, now on 2 L for saturation 100%, states that he is feeling somewhat better.  He remains tachycardic, and with oxygen requirement, and presentation concerning for heart failure exacerbation, hypertensive urgency he has received nitroglycerin  paste, antihypertensives, diuretics, and will require admission for further monitoring, management.   Final diagnoses:  Respiratory distress   CRITICAL CARE Performed by: Lamar Salen Total critical care time: 35 minutes Critical care time was exclusive of separately billable procedures and treating other patients. Critical care was necessary to treat or prevent imminent or life-threatening deterioration. Critical care was time spent personally by me on the following activities: development of treatment plan with patient and/or surrogate as well as nursing, discussions with consultants, evaluation of patient's response to treatment, examination of patient, obtaining history from patient or surrogate, ordering and  performing treatments and interventions, ordering and review of laboratory studies, ordering and review of radiographic studies, pulse oximetry and re-evaluation of patient's condition.    Salen Lamar, MD 06/28/24 2157

## 2024-06-29 ENCOUNTER — Encounter (HOSPITAL_COMMUNITY): Payer: Self-pay | Admitting: Internal Medicine

## 2024-06-29 ENCOUNTER — Other Ambulatory Visit: Payer: Self-pay

## 2024-06-29 ENCOUNTER — Inpatient Hospital Stay (HOSPITAL_COMMUNITY)

## 2024-06-29 DIAGNOSIS — J810 Acute pulmonary edema: Secondary | ICD-10-CM | POA: Diagnosis not present

## 2024-06-29 DIAGNOSIS — I5021 Acute systolic (congestive) heart failure: Secondary | ICD-10-CM

## 2024-06-29 LAB — RAPID URINE DRUG SCREEN, HOSP PERFORMED
Amphetamines: NOT DETECTED
Barbiturates: NOT DETECTED
Benzodiazepines: NOT DETECTED
Cocaine: POSITIVE — AB
Opiates: NOT DETECTED
Tetrahydrocannabinol: NOT DETECTED

## 2024-06-29 LAB — BASIC METABOLIC PANEL WITH GFR
Anion gap: 10 (ref 5–15)
BUN: 17 mg/dL (ref 6–20)
CO2: 25 mmol/L (ref 22–32)
Calcium: 8.8 mg/dL — ABNORMAL LOW (ref 8.9–10.3)
Chloride: 104 mmol/L (ref 98–111)
Creatinine, Ser: 1.44 mg/dL — ABNORMAL HIGH (ref 0.61–1.24)
GFR, Estimated: 57 mL/min — ABNORMAL LOW (ref >60)
Glucose, Bld: 145 mg/dL — ABNORMAL HIGH (ref 70–99)
Potassium: 3.5 mmol/L (ref 3.5–5.1)
Sodium: 139 mmol/L (ref 135–145)

## 2024-06-29 LAB — HEMOGLOBIN A1C
Hgb A1c MFr Bld: 5.6 % (ref 4.8–5.6)
Mean Plasma Glucose: 114.02 mg/dL

## 2024-06-29 LAB — CBC
HCT: 41.9 % (ref 39.0–52.0)
Hemoglobin: 13.7 g/dL (ref 13.0–17.0)
MCH: 29.4 pg (ref 26.0–34.0)
MCHC: 32.7 g/dL (ref 30.0–36.0)
MCV: 89.9 fL (ref 80.0–100.0)
Platelets: 262 K/uL (ref 150–400)
RBC: 4.66 MIL/uL (ref 4.22–5.81)
RDW: 13.2 % (ref 11.5–15.5)
WBC: 6.5 K/uL (ref 4.0–10.5)
nRBC: 0 % (ref 0.0–0.2)

## 2024-06-29 LAB — GLUCOSE, CAPILLARY
Glucose-Capillary: 124 mg/dL — ABNORMAL HIGH (ref 70–99)
Glucose-Capillary: 131 mg/dL — ABNORMAL HIGH (ref 70–99)

## 2024-06-29 LAB — MRSA NEXT GEN BY PCR, NASAL: MRSA by PCR Next Gen: NOT DETECTED

## 2024-06-29 LAB — ECHOCARDIOGRAM COMPLETE
Area-P 1/2: 3.72 cm2
Est EF: <20
S' Lateral: 6.4 cm

## 2024-06-29 LAB — MAGNESIUM: Magnesium: 1.9 mg/dL (ref 1.7–2.4)

## 2024-06-29 LAB — CBG MONITORING, ED: Glucose-Capillary: 127 mg/dL — ABNORMAL HIGH (ref 70–99)

## 2024-06-29 LAB — BRAIN NATRIURETIC PEPTIDE: B Natriuretic Peptide: 822.9 pg/mL — ABNORMAL HIGH (ref 0.0–100.0)

## 2024-06-29 LAB — TROPONIN I (HIGH SENSITIVITY)
Troponin I (High Sensitivity): 39 ng/L — ABNORMAL HIGH (ref <18)
Troponin I (High Sensitivity): 40 ng/L — ABNORMAL HIGH (ref <18)

## 2024-06-29 MED ORDER — MELATONIN 3 MG PO TABS: 3.0000 mg | ORAL_TABLET | Freq: Every evening | ORAL | Status: DC | PRN

## 2024-06-29 MED ORDER — INSULIN ASPART 100 UNIT/ML IJ SOLN: 0.0000 [IU] | Freq: Every day | INTRAMUSCULAR | Status: DC

## 2024-06-29 MED ORDER — PERFLUTREN LIPID MICROSPHERE
1.0000 mL | INTRAVENOUS | Status: AC | PRN
  Administered 2024-06-29: 3 mL via INTRAVENOUS

## 2024-06-29 MED ORDER — INSULIN ASPART 100 UNIT/ML IJ SOLN
0.0000 [IU] | Freq: Three times a day (TID) | INTRAMUSCULAR | Status: DC
  Administered 2024-06-29 (×2): 2 [IU] via SUBCUTANEOUS

## 2024-06-29 MED ORDER — AMLODIPINE BESYLATE 10 MG PO TABS
10.0000 mg | ORAL_TABLET | Freq: Every day | ORAL | Status: DC
  Administered 2024-06-29 – 2024-06-30 (×2): 10 mg via ORAL
  Filled 2024-06-29: qty 2
  Filled 2024-06-29: qty 1

## 2024-06-29 NOTE — Progress Notes (Signed)
 [facility] night cross cover note:   I was notified by RN of the patient's request for a sleep aid. I subsequently placed order for prn melatonin for insomnia.     Newton Pigg, DO Hospitalist

## 2024-06-29 NOTE — Progress Notes (Signed)
 Patient requesting something to help with sleep.  Dr. Marcene paged

## 2024-06-29 NOTE — Progress Notes (Signed)
 Heart Failure Navigator Progress Note  Assessed for Heart & Vascular TOC clinic readiness.  Patient does not meet criteria due to Advanced Heart Failure Team patient of Dr. Shirlee Latch.   Navigator will sign off at this time.    Rhae Hammock, BSN, Scientist, clinical (histocompatibility and immunogenetics) Only

## 2024-06-29 NOTE — ED Notes (Signed)
 Rosario, MD notified of pt request to discuss medications.

## 2024-06-29 NOTE — Progress Notes (Signed)
  Echocardiogram 2D Echocardiogram has been performed.  Koleen KANDICE Popper, RDCS 06/29/2024, 2:52 PM

## 2024-06-29 NOTE — Progress Notes (Addendum)
 PROGRESS NOTE    Matthew [name].  FMW:[id number] DOB: 1966/02/16 DOA: 06/28/2024 PCP: [name], MD  Outpatient Specialists:     Brief Narrative:  Patient is a 58 year old male with past medical history significant for OSA, type 2 diabetes mellitus, combined systolic and diastolic CHF (EF of 25% as per echocardiogram done in December 2024), cocaine abuse and hypertension.  Patient was admitted with acute respiratory distress/CHF exacerbation and hypertensive urgency.  Apparently, patient had been out of his medication for at least 1 month.  Last cocaine use was said to be about a week prior to presentation.  Cardiac BNP on presentation was 627.8, stable serum creatinine at 1.44.  Chest x-ray revealed changes consistent with CHF.  06/29/2024: Patient seen.  Patient is not a particularly good historian.  Shortness of breath/respiratory distress seems to have improved.  Patient is currently on IV Lasix  40 Mg twice daily, nebs albuterol  as needed, losartan  and Aldactone .   Assessment & Plan:   Principal Problem:   Acute pulmonary edema (HCC) Active Problems:   Hypertension   Chronic combined systolic and diastolic congestive heart failure (HCC)   CKD (chronic kidney disease) stage 2, GFR 60-89 ml/min   Diabetes mellitus type II, controlled (HCC)   OSA (obstructive sleep apnea)   History of stroke   Hypertensive urgency   Cocaine use disorder, severe, dependence (HCC)   Respiratory distress   Acute respiratory distress/Severe CHF exacerbation: -Patient has history of systolic congestive heart failure with EF of 25%. -Patient has been noncompliant. -Hypertensive urgency noted. - History of cocaine use. - Admitted with severe CHF exacerbation. - Patient is improving with diuretics. - Blood pressure is slowly improving. - Repeat echocardiogram. - Elevated cardiac BNP noted. -Flat troponin (40).  Likely related to the shortness of breath/CHF/hypertensive urgency. -Counseled  to quit tobacco use. - Follow echo report.  Hypertensive urgency: - Patient has been noncompliant. - Continue IV Lasix , losartan  100 Mg p.o. once daily and Aldactone  25 Mg p.o. once daily. - Add amlodipine  10 Mg p.o. once daily. - Goal blood pressure should be less than 130/80 mmHg.  Type 2 diabetes mellitus: - Follow A1c. - Start sliding scale insulin  coverage.   -Consider restarting metformin  on discharge.    OSA: -The patient reports that he needs a new sleep study appointment.   -CPAP at nighttime during the hospital stay.   CKD 3A: - Stable.     Cocaine abuse: - UDS.  Noncompliance: - Counseled.   DVT prophylaxis: Subcutaneous Lovenox  Code Status: Full code Family Communication:  Disposition Plan: Inpatient   Consultants:  Low threshold to consult the cardiology team.  Procedures:  None.  Antimicrobials:  None.   Subjective: -Poor historian. - Shortness of breath is improving. - No chest pain.  Objective: Vitals:   06/29/24 0600 06/29/24 0615 06/29/24 0630 06/29/24 0645  BP: (!) 149/102 (!) 157/109 (!) 154/111 (!) 162/100  Pulse: 75 70 75 74  Resp: (!) 40 14 18 17   Temp:      TempSrc:      SpO2: 97% 98% 98% 98%    Intake/Output Summary (Last 24 hours) at 06/29/2024 0718 Last data filed at 06/29/2024 0645 Gross per 24 hour  Intake --  Output 1800 ml  Net -1800 ml   There were no vitals filed for this visit.  Examination:  General exam: Appears calm and comfortable  Respiratory system: Clear to auscultation.  Cardiovascular system: S1 & S2 heard Gastrointestinal system: Abdomen is soft and nontender.  Central nervous system: Awake and alert.  .  Extremities: No leg edema.  Data Reviewed: I have personally reviewed following labs and imaging studies  CBC: Recent Labs  Lab 06/28/24 1909 06/29/24 0355  WBC 4.6 6.5  NEUTROABS 2.5  --   HGB 13.4 13.7  HCT 40.8 41.9  MCV 91.7 89.9  PLT 233 262   Basic Metabolic Panel: Recent Labs   Lab 06/28/24 1909 06/29/24 0355  NA 139 139  K 4.4 3.5  CL 109 104  CO2 25 25  GLUCOSE 99 145*  BUN 19 17  CREATININE 1.44* 1.44*  CALCIUM  8.9 8.8*  MG  --  1.9   GFR: CrCl cannot be calculated (Unknown ideal weight.). Liver Function Tests: Recent Labs  Lab 06/28/24 1909  AST 33  ALT 32  ALKPHOS 69  BILITOT 0.4  PROT 6.1*  ALBUMIN 3.1*   No results for input(s): LIPASE, AMYLASE in the last 168 hours. No results for input(s): AMMONIA in the last 168 hours. Coagulation Profile: No results for input(s): INR, PROTIME in the last 168 hours. Cardiac Enzymes: No results for input(s): CKTOTAL, CKMB, CKMBINDEX, TROPONINI in the last 168 hours. BNP (last 3 results) No results for input(s): PROBNP in the last 8760 hours. HbA1C: No results for input(s): HGBA1C in the last 72 hours. CBG: No results for input(s): GLUCAP in the last 168 hours. Lipid Profile: No results for input(s): CHOL, HDL, LDLCALC, TRIG, CHOLHDL, LDLDIRECT in the last 72 hours. Thyroid  Function Tests: No results for input(s): TSH, T4TOTAL, FREET4, T3FREE, THYROIDAB in the last 72 hours. Anemia Panel: No results for input(s): VITAMINB12, FOLATE, FERRITIN, TIBC, IRON, RETICCTPCT in the last 72 hours. Urine analysis:    Component Value Date/Time   COLORURINE YELLOW 11/17/2023 2000   APPEARANCEUR CLEAR 11/17/2023 2000   LABSPEC 1.014 11/17/2023 2000   PHURINE 5.0 11/17/2023 2000   GLUCOSEU NEGATIVE 11/17/2023 2000   HGBUR NEGATIVE 11/17/2023 2000   BILIRUBINUR NEGATIVE 11/17/2023 2000   KETONESUR NEGATIVE 11/17/2023 2000   PROTEINUR 30 (A) 11/17/2023 2000   NITRITE NEGATIVE 11/17/2023 2000   LEUKOCYTESUR NEGATIVE 11/17/2023 2000   Sepsis Labs: @LABRCNTIP (procalcitonin:4,lacticidven:4)  ) Recent Results (from the past 240 hours)  Resp panel by RT-PCR (RSV, Flu A&B, Covid) Anterior Nasal Swab     Status: None   Collection Time: 06/28/24  8:14  PM   Specimen: Anterior Nasal Swab  Result Value Ref Range Status   SARS Coronavirus 2 by RT PCR NEGATIVE NEGATIVE Final   Influenza A by PCR NEGATIVE NEGATIVE Final   Influenza B by PCR NEGATIVE NEGATIVE Final    Comment: (NOTE) The Xpert Xpress SARS-CoV-2/FLU/RSV plus assay is intended as an aid in the diagnosis of influenza from Nasopharyngeal swab specimens and should not be used as a sole basis for treatment. Nasal washings and aspirates are unacceptable for Xpert Xpress SARS-CoV-2/FLU/RSV testing.  Fact Sheet for Patients: BloggerCourse.com  Fact Sheet for Healthcare Providers: SeriousBroker.it  This test is not yet approved or cleared by the [place]  FDA and has been authorized for detection and/or diagnosis of SARS-CoV-2 by FDA under an Emergency Use Authorization (EUA). This EUA will remain in effect (meaning this test can be used) for the duration of the COVID-19 declaration under Section 564(b)(1) of the Act, 21 U.S.C. section 360bbb-3(b)(1), unless the authorization is terminated or revoked.     Resp Syncytial Virus by PCR NEGATIVE NEGATIVE Final    Comment: (NOTE) Fact Sheet for Patients: BloggerCourse.com  Fact Sheet for Healthcare  Providers: SeriousBroker.it  This test is not yet approved or cleared by the [place]  FDA and has been authorized for detection and/or diagnosis of SARS-CoV-2 by FDA under an Emergency Use Authorization (EUA). This EUA will remain in effect (meaning this test can be used) for the duration of the COVID-19 declaration under Section 564(b)(1) of the Act, 21 U.S.C. section 360bbb-3(b)(1), unless the authorization is terminated or revoked.  Performed at [facility], [address] [place], [place] [address]          Radiology Studies: DG Chest Port 1 View Result Date: 06/28/2024 CLINICAL DATA:  Shortness of  breath EXAM: PORTABLE CHEST 1 VIEW COMPARISON:  11/16/2023 FINDINGS: Cardiac shadow is prominent but accentuated by the frontal technique. Lungs are well aerated bilaterally. Central vascular congestion is noted with mild interstitial edema consistent with CHF. No sizable effusion is seen. No bony abnormality is noted. IMPRESSION: Changes consistent with CHF. Electronically Signed   By: Oneil Devonshire M.D.   On: 06/28/2024 19:19        Scheduled Meds:  enoxaparin  (LOVENOX ) injection  40 mg Subcutaneous Daily   furosemide   40 mg Intravenous Q12H   losartan   100 mg Oral Daily   melatonin  3 mg Oral QHS   spironolactone   25 mg Oral Daily   Continuous Infusions:   LOS: 1 day    Time spent: 55 minutes.    Leatrice Chapel, MD  [facility] Pager #: [phone] 7PM-7AM contact night coverage as above

## 2024-06-29 NOTE — Plan of Care (Signed)
  Problem: Elimination: Goal: Will not experience complications related to bowel motility Outcome: Progressing Goal: Will not experience complications related to urinary retention Outcome: Progressing   Problem: Pain Managment: Goal: General experience of comfort will improve and/or be controlled Outcome: Progressing

## 2024-06-29 NOTE — ED Notes (Signed)
 Pt reminded of fluid restriction.

## 2024-06-29 NOTE — TOC Initial Note (Signed)
 Transition of Care (TOC) - Initial/Assessment Note    Patient Details  Name: Matthew [name]. MRN: [id number] Date of Birth: October 28, 1966  Transition of Care [facility]) CM/SW Contact:    Lauraine FORBES Saa, LCSW Phone Number: 06/29/2024, 5:10 PM  Clinical Narrative:                  5:10 PM CSW introduced self and role to patient. Patient confirmed he resides at home alone and does not have transportation for discharge. CSW informed patient of transportation options (taxi voucher/bus pass). Patient expressed interest in taxi voucher. CSW provided additional transportation resources. Patient denied SNF/HH/DME history (per chart review, has CPAP).  Per chart review, patient has insurance and a PCP. Patient's preferred pharmacy is [facility]. Patient informed CSW that he is in the process of reapplying for food stamps and requested assistance in emailing/faxing application to DSS. CSW will follow up.  Expected Discharge Plan: Home/Self Care Barriers to Discharge: Continued Medical Work up   Patient Goals and CMS Choice Patient states their goals for this hospitalization and ongoing recovery are:: to return home          Expected Discharge Plan and Services In-house Referral: Clinical Social Work     Living arrangements for the past 2 months: Apartment                                      Prior Living Arrangements/Services Living arrangements for the past 2 months: Apartment Lives with:: Self Patient language and need for interpreter reviewed:: Yes Do you feel safe going back to the place where you live?: Yes        Care giver support system in place?: No (comment)   Criminal Activity/Legal Involvement Pertinent to Current Situation/Hospitalization: No - Comment as needed  Activities of Daily Living   ADL Screening (condition at time of admission) Independently performs ADLs?: Yes (appropriate for developmental age) Is the patient deaf or  have difficulty hearing?: No Does the patient have difficulty seeing, even when wearing glasses/contacts?: No Does the patient have difficulty concentrating, remembering, or making decisions?: No  Permission Sought/Granted Permission sought to share information with : Other (comment) Permission granted to share information with : No (Contact information on chart)  Share Information with NAME: Ronal Pouch     Permission granted to share info w Relationship: Other  Permission granted to share info w Contact Information: [phone]  Emotional Assessment Appearance:: Appears stated age Attitude/Demeanor/Rapport: Engaged Affect (typically observed): Accepting, Appropriate, Calm, Stable, Pleasant Orientation: : Oriented to Self, Oriented to Place, Oriented to  Time, Oriented to Situation Alcohol / Substance Use: Not Applicable Psych Involvement: No (comment)  Admission diagnosis:  Respiratory distress [R06.03] Patient Active Problem List   Diagnosis Date Noted   Respiratory distress 06/28/2024   MDD (major depressive disorder) 01/14/2024   Panic disorder 01/14/2024   Cocaine use disorder, severe, dependence (HCC) 01/14/2024   Acute on chronic hypoxic respiratory failure (HCC) 11/16/2023   Acute pulmonary edema (HCC) 11/16/2023   Cocaine use 11/16/2023   Acute hypoxic respiratory failure (HCC) 11/16/2023   GAD (generalized anxiety disorder) 06/28/2022   Cocaine abuse (HCC) 06/28/2022   MDD (major depressive disorder), recurrent episode (HCC) 04/21/2022   MDD (major depressive disorder), recurrent, severe, with psychosis (HCC) 02/16/2022   MDD (major depressive disorder), recurrent episode, severe (HCC) 02/15/2022   Impacted cerumen of right  ear 03/24/2021   MDD (major depressive disorder), recurrent severe, without psychosis (HCC) 01/03/2021   Decompensated heart failure (HCC) 12/24/2020   Acute respiratory failure with hypoxia (HCC) 02/15/2020   Acute on chronic combined  systolic and diastolic CHF (congestive heart failure) (HCC) 02/15/2020   Hypertensive urgency 02/15/2020   CHF (congestive heart failure) (HCC) 02/15/2020   Depression due to physical illness 11/18/2018   Expressive aphasia 02/20/2017   Mixed hyperlipidemia    History of stroke 01/31/2017   Erectile dysfunction 07/30/2016   OSA (obstructive sleep apnea) 05/10/2015   CKD (chronic kidney disease) stage 2, GFR 60-89 ml/min 03/10/2014   Diabetes mellitus type II, controlled (HCC) 03/10/2014   Chronic combined systolic and diastolic congestive heart failure (HCC) 03/03/2014   Nicotine  use disorder    Morbid obesity (HCC)    Hypertension 03/02/2014   PCP:  [name], MD Pharmacy:   [facility] [address], [address] [place] [address] Phone: [phone] Fax: [phone]     Social Drivers of Health (SDOH) Social History: SDOH Screenings   Food Insecurity: No Food Insecurity (06/29/2024)  Housing: Low Risk  (06/29/2024)  Transportation Needs: No Transportation Needs (06/29/2024)  Utilities: Not At Risk (06/29/2024)  Alcohol Screen: Low Risk  (01/14/2024)  Depression (PHQ2-9): High Risk (11/04/2023)  Financial Resource Strain: High Risk (01/24/2021)  Social Connections: Unknown (05/01/2022)   Received from [facility]  Stress: Stress Concern Present (01/24/2021)  Tobacco Use: High Risk (06/29/2024)   SDOH Interventions:     Readmission Risk Interventions     No data to display

## 2024-06-29 NOTE — Progress Notes (Signed)
   06/29/24 2344  BiPAP/CPAP/SIPAP  $ Non-Invasive Ventilator  Non-Invasive Vent Initial  $ Face Mask Small Yes  BiPAP/CPAP/SIPAP Pt Type Adult  BiPAP/CPAP/SIPAP DREAMSTATIOND  Mask Type Full face mask  Mask Size Small  EPAP 5 cmH2O  FiO2 (%) 21 %  Patient Home Machine No  Patient Home Mask No  Patient Home Tubing No  Auto Titrate No  CPAP/SIPAP surface wiped down Yes  Device Plugged into RED Power Outlet Yes  BiPAP/CPAP /SiPAP Vitals  Bilateral Breath Sounds Clear

## 2024-06-30 ENCOUNTER — Other Ambulatory Visit (HOSPITAL_COMMUNITY): Payer: Self-pay

## 2024-06-30 DIAGNOSIS — J810 Acute pulmonary edema: Secondary | ICD-10-CM | POA: Diagnosis not present

## 2024-06-30 LAB — RENAL FUNCTION PANEL
Albumin: 3.2 g/dL — ABNORMAL LOW (ref 3.5–5.0)
Anion gap: 12 (ref 5–15)
BUN: 20 mg/dL (ref 6–20)
CO2: 29 mmol/L (ref 22–32)
Calcium: 9.3 mg/dL (ref 8.9–10.3)
Chloride: 97 mmol/L — ABNORMAL LOW (ref 98–111)
Creatinine, Ser: 1.64 mg/dL — ABNORMAL HIGH (ref 0.61–1.24)
GFR, Estimated: 48 mL/min — ABNORMAL LOW (ref >60)
Glucose, Bld: 131 mg/dL — ABNORMAL HIGH (ref 70–99)
Phosphorus: 3 mg/dL (ref 2.5–4.6)
Potassium: 3.6 mmol/L (ref 3.5–5.1)
Sodium: 138 mmol/L (ref 135–145)

## 2024-06-30 LAB — GLUCOSE, CAPILLARY
Glucose-Capillary: 113 mg/dL — ABNORMAL HIGH (ref 70–99)
Glucose-Capillary: 198 mg/dL — ABNORMAL HIGH (ref 70–99)
Glucose-Capillary: 91 mg/dL (ref 70–99)

## 2024-06-30 LAB — MAGNESIUM: Magnesium: 1.8 mg/dL (ref 1.7–2.4)

## 2024-06-30 MED ORDER — SPIRONOLACTONE 25 MG PO TABS
25.0000 mg | ORAL_TABLET | Freq: Every day | ORAL | 3 refills | Status: DC
  Filled 2024-06-30 (×2): qty 30, 30d supply, fill #0

## 2024-06-30 MED ORDER — GABAPENTIN 100 MG PO CAPS
100.0000 mg | ORAL_CAPSULE | Freq: Three times a day (TID) | ORAL | 3 refills | Status: AC
Start: 2024-06-30 — End: ?
  Filled 2024-06-30 (×2): qty 90, 30d supply, fill #0

## 2024-06-30 MED ORDER — CARVEDILOL 6.25 MG PO TABS: 6.2500 mg | ORAL_TABLET | Freq: Two times a day (BID) | ORAL | Status: DC

## 2024-06-30 MED ORDER — AMLODIPINE BESYLATE 10 MG PO TABS: 10.0000 mg | ORAL_TABLET | Freq: Every day | ORAL | Status: DC

## 2024-06-30 MED ORDER — AMLODIPINE BESYLATE 10 MG PO TABS
10.0000 mg | ORAL_TABLET | Freq: Every day | ORAL | 11 refills | Status: DC
  Filled 2024-06-30 (×2): qty 30, 30d supply, fill #0

## 2024-06-30 MED ORDER — FUROSEMIDE 40 MG PO TABS
40.0000 mg | ORAL_TABLET | Freq: Every day | ORAL | 3 refills | Status: DC
  Filled 2024-06-30 (×2): qty 30, 30d supply, fill #0
  Filled 2024-09-17: qty 30, 30d supply, fill #1
  Filled 2024-09-18: qty 30, 30d supply, fill #0
  Filled 2024-09-18: qty 30, 30d supply, fill #2
  Filled 2024-09-22 – 2024-12-03 (×3): qty 30, 30d supply, fill #0

## 2024-06-30 MED ORDER — HYDROXYZINE HCL 10 MG PO TABS
10.0000 mg | ORAL_TABLET | Freq: Three times a day (TID) | ORAL | 0 refills | Status: DC | PRN
  Filled 2024-06-30 (×2): qty 30, 10d supply, fill #0

## 2024-06-30 MED ORDER — LOSARTAN POTASSIUM 100 MG PO TABS
100.0000 mg | ORAL_TABLET | Freq: Every day | ORAL | 3 refills | Status: DC
  Filled 2024-06-30 (×2): qty 30, 30d supply, fill #0

## 2024-06-30 MED ORDER — EZETIMIBE 10 MG PO TABS
10.0000 mg | ORAL_TABLET | Freq: Every day | ORAL | 3 refills | Status: DC
  Filled 2024-06-30 (×2): qty 30, 30d supply, fill #0

## 2024-06-30 MED ORDER — ATORVASTATIN CALCIUM 80 MG PO TABS
80.0000 mg | ORAL_TABLET | Freq: Every day | ORAL | 3 refills | Status: DC
  Filled 2024-06-30 (×2): qty 30, 30d supply, fill #0

## 2024-06-30 MED ORDER — TIMOLOL MALEATE 0.5 % OP SOLN
1.0000 [drp] | Freq: Every day | OPHTHALMIC | 0 refills | Status: AC
  Filled 2024-06-30 (×2): qty 5, 34d supply, fill #0

## 2024-06-30 MED ORDER — SERTRALINE HCL 100 MG PO TABS
150.0000 mg | ORAL_TABLET | Freq: Every day | ORAL | 0 refills | Status: DC
  Filled 2024-06-30: qty 45, 30d supply, fill #0
  Filled 2024-06-30: qty 90, 30d supply, fill #0

## 2024-06-30 NOTE — TOC Transition Note (Signed)
 Transition of Care [facility]) - Discharge Note   Patient Details  Name: Matthew [name]. MRN: [id number] Date of Birth: 08-29-1966  Transition of Care [facility]) CM/SW Contact:  Roxie KANDICE Stain, RN Phone Number: 06/30/2024, 1:02 PM   Clinical Narrative:    Patient stable for discharge. Patient to follow up with PCP in 1 week.   Final next level of care: Home/Self Care Barriers to Discharge: Barriers Resolved   Patient Goals and CMS Choice Patient states their goals for this hospitalization and ongoing recovery are:: to return home          Discharge Placement               Home        Discharge Plan and Services Additional resources added to the After Visit Summary for   In-house Referral: Clinical Social Work                                   Social Drivers of Health (SDOH) Interventions SDOH Screenings   Food Insecurity: No Food Insecurity (06/29/2024)  Housing: Low Risk  (06/29/2024)  Transportation Needs: No Transportation Needs (06/29/2024)  Utilities: Not At Risk (06/29/2024)  Alcohol Screen: Low Risk  (01/14/2024)  Depression (PHQ2-9): High Risk (11/04/2023)  Financial Resource Strain: High Risk (01/24/2021)  Social Connections: Unknown (05/01/2022)   Received from [facility]  Stress: Stress Concern Present (01/24/2021)  Tobacco Use: High Risk (06/29/2024)     Readmission Risk Interventions    06/30/2024    1:02 PM  Readmission Risk Prevention Plan  Transportation Screening Complete  PCP or Specialist Appt within 5-7 Days Complete  Home Care Screening Complete  Medication Review (RN CM) Complete

## 2024-06-30 NOTE — TOC CM/SW Note (Signed)
 Transition of Care [facility]) - Inpatient Brief Assessment   Patient Details  Name: Matthew [name]. MRN: [id number] Date of Birth: 1966-08-03  Transition of Care [facility]) CM/SW Contact:    Lauraine FORBES Saa, LCSW Phone Number: 06/30/2024, 1:45 PM   Clinical Narrative:  1:45 PM CSW followed up with patient around 10:50AM on food stamps recertification application. Patient stated that he had yet to complete application and accepted CSW of returning upon completion. Patient is discharging today.  Transition of Care Asessment: Insurance and Status: Insurance coverage has been reviewed Patient has primary care physician: Yes Home environment has been reviewed: Private Residence Prior level of function:: N/A Prior/Current Home Services: No current home services Social Drivers of Health Review: SDOH reviewed no interventions necessary Readmission risk has been reviewed: Yes Transition of care needs: no transition of care needs at this time

## 2024-06-30 NOTE — Plan of Care (Signed)
   Problem: Education: Goal: Ability to describe self-care measures that may prevent or decrease complications (Diabetes Survival Skills Education) will improve Outcome: Progressing Goal: Individualized Educational Video(s) Outcome: Progressing   Problem: Coping: Goal: Ability to adjust to condition or change in health will improve Outcome: Progressing   Problem: Fluid Volume: Goal: Ability to maintain a balanced intake and output will improve Outcome: Progressing   Problem: Health Behavior/Discharge Planning: Goal: Ability to identify and utilize available resources and services will improve Outcome: Progressing Goal: Ability to manage health-related needs will improve Outcome: Progressing   Problem: Metabolic: Goal: Ability to maintain appropriate glucose levels will improve Outcome: Progressing   Problem: Nutritional: Goal: Maintenance of adequate nutrition will improve Outcome: Progressing Goal: Progress toward achieving an optimal weight will improve Outcome: Progressing   Problem: Skin Integrity: Goal: Risk for impaired skin integrity will decrease Outcome: Progressing   Problem: Tissue Perfusion: Goal: Adequacy of tissue perfusion will improve Outcome: Progressing   Problem: Education: Goal: Knowledge of General Education information will improve Description: Including pain rating scale, medication(s)/side effects and non-pharmacologic comfort measures Outcome: Progressing   Problem: Health Behavior/Discharge Planning: Goal: Ability to manage health-related needs will improve Outcome: Progressing   Problem: Clinical Measurements: Goal: Ability to maintain clinical measurements within normal limits will improve Outcome: Progressing Goal: Will remain free from infection Outcome: Progressing Goal: Diagnostic test results will improve Outcome: Progressing Goal: Respiratory complications will improve Outcome: Progressing Goal: Cardiovascular complication will  be avoided Outcome: Progressing   Problem: Activity: Goal: Risk for activity intolerance will decrease Outcome: Progressing   Problem: Coping: Goal: Level of anxiety will decrease Outcome: Progressing   Problem: Elimination: Goal: Will not experience complications related to bowel motility Outcome: Progressing Goal: Will not experience complications related to urinary retention Outcome: Progressing   Problem: Pain Managment: Goal: General experience of comfort will improve and/or be controlled Outcome: Progressing   Problem: Safety: Goal: Ability to remain free from injury will improve Outcome: Progressing   Problem: Skin Integrity: Goal: Risk for impaired skin integrity will decrease Outcome: Progressing

## 2024-06-30 NOTE — Discharge Summary (Signed)
 [facility] Discharge Summary   Patient: Matthew [name]. FMW:[id number]  PCP: [name], MD  Date of admission: 06/28/2024   Date of discharge:  06/30/2024     Discharge Diagnoses:  Principal Problem:   Acute pulmonary edema (HCC) Active Problems:   Hypertension   Chronic combined systolic and diastolic congestive heart failure (HCC)   CKD (chronic kidney disease) stage 2, GFR 60-89 ml/min   Diabetes mellitus type II, controlled (HCC)   OSA (obstructive sleep apnea)   History of stroke   Hypertensive urgency   Cocaine use disorder, severe, dependence (HCC)   Respiratory distress   Admitted From: Home Disposition:  Home   Recommendations for Outpatient Follow-up:  PCP: in 1 wk F/u Cardio HF clinic in 1 week Follow up LABS/TEST:     Follow-up Information     [name], MD Follow up in 1 week(s).   Specialty: Family Medicine Contact information: [address] [place] [address] [place] [address] [phone]                Diet recommendation: Cardiac diet  Activity: The patient is advised to gradually reintroduce usual activities, as tolerated  Discharge Condition: stable  Code Status: Full code   History of present illness: As per the H and P dictated on admission  Hospital Course:  Patient is a 58 year old male with past medical history significant for OSA, type 2 diabetes mellitus, combined systolic and diastolic CHF (EF of 25% as per echocardiogram done in December 2024), cocaine abuse and hypertension.  Patient was admitted with acute respiratory distress/CHF exacerbation and hypertensive urgency.  Apparently, patient had been out of his medication for at least 1 month.  Last cocaine use was said to be about a week prior to presentation.  Cardiac BNP on presentation was 627.8, stable serum creatinine at 1.44.  Chest x-ray revealed changes consistent with CHF.      Assessment & Plan:   # Acute respiratory distress/Severe CHF  exacerbation: -Patient has history of systolic congestive heart failure with EF of 25%. Patient has been noncompliant. Hypertensive urgency noted. S/p IV Lasix  given for diuresis, breathing and lower extremity edema improved.  TTE LVEF <20%, grade 2 diastolic dysfunction, severely enlarged left atrium.  7/15 patient was feeling improvement in the shortness of breath, lower extremity edema resolved.  Patient wanted to go home.  Clinically seems stable.  Started on Lasix  40 mg POD, losartan  100 mg p.o. daily,  Aldactone  25 mg p.o. daily    Hypertensive urgency: - Patient has been noncompliant. -s/p IV Lasix , losartan  100 Mg p.o. once daily and Aldactone  25 Mg p.o. once daily. - Started amlodipine  10 Mg p.o. once daily.  Avoided beta-blocker due to cocaine use.  Blood pressure improved, patient was advised to continue current treatment.  Compliance enforced.     H/o Type 2 diabetes mellitus: Hemoglobin A1c 5.6, nondiabetic range.  Currently patient is not on any medication.  Advised to continue diabetic diet and follow with PCP.   OSA:  -The patient reports that he needs a new sleep study appointment.   -CPAP at nighttime during the hospital stay.   CKD 3A: Stable.      Cocaine abuse: UDS positive for cocaine.  Drug abuse abstinence counseling done.   Noncompliance:  Counseled to be compliant with medications and follow-up with PCP and cardiology.  Body mass index is 21.19 kg/m.  Nutrition Interventions:  - Patient was instructed, not to drive, operate heavy machinery, perform activities  at heights, swimming or participation in water activities or provide baby sitting services while on Pain, Sleep and Anxiety Medications; until his outpatient Physician has advised to do so again.  - Also recommended to not to take more than prescribed Pain, Sleep and Anxiety Medications.  Patient was ambulatory without any assistance. On the day of the discharge the patient's vitals were stable, and no  other acute medical condition were reported by patient. the patient was felt safe to be discharge at Home.  Stable to discharge but remains at high risk for readmission due to noncompliance. All medications were refilled and sent prescription to [facility] to be delivered at bedside.  Consultants: HF team, signed off, could not meet criteria to be seen Procedures: None  Discharge Exam: General: Appear in no distress, no Rash; Oral Mucosa Clear, moist. Cardiovascular: S1 and S2 Present, no Murmur, Respiratory: normal respiratory effort, Bilateral Air entry present and no Crackles, no wheezes Abdomen: Bowel Sound present, Soft and no tenderness, no hernia Extremities: no Pedal edema, no calf tenderness Neurology: alert and oriented to time, place, and person affect appropriate.  Filed Weights   06/29/24 1522  Weight: 65.1 kg   Vitals:   06/30/24 0736 06/30/24 1133  BP: (!) 143/94 (!) 148/97  Pulse: 75 81  Resp: (!) 23 16  Temp: 97.9 F (36.6 C) 97.6 F (36.4 C)  SpO2: 98% 99%    DISCHARGE MEDICATION: Allergies as of 06/30/2024   No Known Allergies      Medication List     TAKE these medications    amLODipine  10 MG tablet Commonly known as: NORVASC  Take 1 tablet (10 mg total) by mouth daily. Skip the dose if systolic BP less than 130 mmHg Start taking on: July 01, 2024   atorvastatin  80 MG tablet Commonly known as: LIPITOR  Take 1 tablet (80 mg total) by mouth daily.   ezetimibe  10 MG tablet Commonly known as: ZETIA  Take 1 tablet (10 mg total) by mouth daily.   furosemide  40 MG tablet Commonly known as: Lasix  Take 1 tablet (40 mg total) by mouth daily.   gabapentin  100 MG capsule Commonly known as: NEURONTIN  Take 1 capsule (100 mg total) by mouth every 8 (eight) hours.   hydrOXYzine  10 MG tablet Commonly known as: ATARAX  Take 1 tablet (10 mg total) by mouth 3 (three) times daily as needed for itching or anxiety.   losartan  100 MG  tablet Commonly known as: COZAAR  Take 1 tablet (100 mg total) by mouth daily.   melatonin 5 MG Tabs Take 1 tablet (5 mg total) by mouth at bedtime.   sertraline  50 MG tablet Commonly known as: ZOLOFT  Take 3 tablets (150 mg total) by mouth daily.   spironolactone  25 MG tablet Commonly known as: ALDACTONE  Take 1 tablet (25 mg total) by mouth daily.   timolol  0.5 % ophthalmic solution Commonly known as: TIMOPTIC  Place 1 drop into both eyes daily after breakfast.       No Known Allergies Discharge Instructions     AMB referral to CHF clinic   Complete by: As directed    Reason for referral: Systolic HF   Call MD for:   Complete by: As directed    LE edema   Call MD for:  difficulty breathing, headache or visual disturbances   Complete by: As directed    Call MD for:  extreme fatigue   Complete by: As directed    Call MD for:  persistant dizziness or  light-headedness   Complete by: As directed    Call MD for:  severe uncontrolled pain   Complete by: As directed    Diet - low sodium heart healthy   Complete by: As directed    Discharge instructions   Complete by: As directed    F/u with PCP in 1 wk F/u with Cardio in 1-2 weeks  Repeat BMP in 1-2 wks. Monitor BP at home   Increase activity slowly   Complete by: As directed        The results of significant diagnostics from this hospitalization (including imaging, microbiology, ancillary and laboratory) are listed below for reference.    Significant Diagnostic Studies: ECHOCARDIOGRAM COMPLETE Result Date: 06/29/2024    ECHOCARDIOGRAM REPORT   Patient Name:   Devesh Monforte. Date of Exam: 06/29/2024 Medical Rec #:  [id number]           Height:       69.0 in Accession #:    [id number]          Weight:       170.0 lb Date of Birth:  [birth year]           BSA:          1.928 m Patient Age:    58 years            BP:           152/109 mmHg Patient Gender: M                   HR:           71 bpm. Exam Location:  Inpatient  Procedure: 2D Echo, Intracardiac Opacification Agent, Color Doppler and Cardiac            Doppler (Both Spectral and Color Flow Doppler were utilized during            procedure). Indications:    CHF- Acute Systolic I50.21  History:        Patient has prior history of Echocardiogram examinations, most                 recent 11/17/2023. CHF, Chronic Kidney Disease; Risk                 Factors:Hypertension, Sleep Apnea, Diabetes and Current Smoker.  Sonographer:    Koleen Popper RDCS Referring Phys: [id number] CLAUDIA CLAIBORNE IMPRESSIONS  1. No obvious left ventricular mural or apical thrombus/thrombi; however, significant spontaneous contrast noted in the LV cavity due to reduced LVEF which increases the chance of LV thrombus. . Left ventricular ejection fraction, by estimation, is <20%. The left ventricle has severely decreased function. The left ventricle demonstrates global hypokinesis. The left ventricular internal cavity size was severely dilated. There is mild left ventricular hypertrophy. Left ventricular diastolic parameters are consistent with Grade II diastolic dysfunction (pseudonormalization).  2. Right ventricular systolic function is normal. The right ventricular size is mildly enlarged.  3. Left atrial size was severely dilated.  4. A small pericardial effusion is present. The pericardial effusion is circumferential. There is no evidence of cardiac tamponade.  5. The mitral valve is degenerative. Moderate mitral valve regurgitation. No evidence of mitral stenosis.  6. The aortic valve is tricuspid. Aortic valve regurgitation is not visualized. Aortic valve sclerosis is present, with no evidence of aortic valve stenosis.  7. Aortic dilatation noted. There is dilatation of the ascending aorta, measuring 39 mm. Comparison(s): A prior study was performed on 11/17/2023.  Reported: LVEF 25-30%, Grade II diastolic dysfunction, severe LAE, small pericardial effusion, estimated RAP . FINDINGS  Left Ventricle:  No obvious left ventricular mural or apical thrombus/thrombi; however, significant spontaneous contrast noted in the LV cavity due to reduced LVEF which increases the chance of LV thrombus. Left ventricular ejection fraction, by estimation, is <20%. The left ventricle has severely decreased function. The left ventricle demonstrates global hypokinesis. Definity  contrast agent was given IV to delineate the left ventricular endocardial borders. The left ventricular internal cavity size was severely dilated. There is mild left ventricular hypertrophy. Left ventricular diastolic parameters are consistent with Grade II diastolic dysfunction (pseudonormalization). Right Ventricle: The right ventricular size is mildly enlarged. Right vetricular wall thickness was not well visualized. Right ventricular systolic function is normal. Left Atrium: Left atrial size was severely dilated. Right Atrium: Right atrial size was normal in size. Pericardium: A small pericardial effusion is present. The pericardial effusion is circumferential. There is no evidence of cardiac tamponade. Mitral Valve: The mitral valve is degenerative in appearance. There is mild thickening of the mitral valve leaflet(s). Normal mobility of the mitral valve leaflets. Mild mitral annular calcification. Moderate mitral valve regurgitation. No evidence of mitral valve stenosis. Tricuspid Valve: The tricuspid valve is grossly normal. Tricuspid valve regurgitation is mild . No evidence of tricuspid stenosis. Aortic Valve: The aortic valve is tricuspid. Aortic valve regurgitation is not visualized. Aortic valve sclerosis is present, with no evidence of aortic valve stenosis. Pulmonic Valve: The pulmonic valve was normal in structure. Pulmonic valve regurgitation is not visualized. No evidence of pulmonic stenosis. Aorta: The aortic root is normal in size and structure and aortic dilatation noted. There is dilatation of the ascending aorta, measuring 39 mm.  IAS/Shunts: There is right bowing of the interatrial septum, suggestive of elevated left atrial pressure. The atrial septum is grossly normal.  LEFT VENTRICLE PLAX 2D LVIDd:         6.90 cm   Diastology LVIDs:         6.40 cm   LV e' medial:    4.88 cm/s LV PW:         1.30 cm   LV E/e' medial:  13.0 LV IVS:        1.40 cm   LV e' lateral:   5.55 cm/s LVOT diam:     2.50 cm   LV E/e' lateral: 11.5 LV SV:         56 LV SV Index:   29 LVOT Area:     4.91 cm  RIGHT VENTRICLE RV Basal diam:  4.50 cm RV S prime:     14.00 cm/s TAPSE (M-mode): 2.3 cm LEFT ATRIUM              Index        RIGHT ATRIUM           Index LA diam:        5.30 cm  2.75 cm/m   RA Area:     16.90 cm LA Vol (A2C):   122.0 ml 63.27 ml/m  RA Volume:   43.30 ml  22.46 ml/m LA Vol (A4C):   97.1 ml  50.36 ml/m LA Biplane Vol: 117.0 ml 60.68 ml/m  AORTIC VALVE LVOT Vmax:   64.70 cm/s LVOT Vmean:  40.800 cm/s LVOT VTI:    0.114 m  AORTA Ao Root diam: 3.60 cm Ao Asc diam:  3.90 cm MITRAL VALVE MV Area (PHT): 3.72 cm    SHUNTS MV Decel  Time: 204 msec    Systemic VTI:  0.11 m MV E velocity: 63.60 cm/s  Systemic Diam: 2.50 cm MV A velocity: 36.40 cm/s MV E/A ratio:  1.75 Sunit Tolia Electronically signed by Madonna Large Signature Date/Time: 06/29/2024/5:46:22 PM    Final    DG Chest Port 1 View Result Date: 06/28/2024 CLINICAL DATA:  Shortness of breath EXAM: PORTABLE CHEST 1 VIEW COMPARISON:  11/16/2023 FINDINGS: Cardiac shadow is prominent but accentuated by the frontal technique. Lungs are well aerated bilaterally. Central vascular congestion is noted with mild interstitial edema consistent with CHF. No sizable effusion is seen. No bony abnormality is noted. IMPRESSION: Changes consistent with CHF. Electronically Signed   By: Oneil Devonshire M.D.   On: 06/28/2024 19:19    Microbiology: Recent Results (from the past 240 hours)  Resp panel by RT-PCR (RSV, Flu A&B, Covid) Anterior Nasal Swab     Status: None   Collection Time: 06/28/24  8:14 PM    Specimen: Anterior Nasal Swab  Result Value Ref Range Status   SARS Coronavirus 2 by RT PCR NEGATIVE NEGATIVE Final   Influenza A by PCR NEGATIVE NEGATIVE Final   Influenza B by PCR NEGATIVE NEGATIVE Final    Comment: (NOTE) The Xpert Xpress SARS-CoV-2/FLU/RSV plus assay is intended as an aid in the diagnosis of influenza from Nasopharyngeal swab specimens and should not be used as a sole basis for treatment. Nasal washings and aspirates are unacceptable for Xpert Xpress SARS-CoV-2/FLU/RSV testing.  Fact Sheet for Patients: BloggerCourse.com  Fact Sheet for Healthcare Providers: SeriousBroker.it  This test is not yet approved or cleared by the [place]  FDA and has been authorized for detection and/or diagnosis of SARS-CoV-2 by FDA under an Emergency Use Authorization (EUA). This EUA will remain in effect (meaning this test can be used) for the duration of the COVID-19 declaration under Section 564(b)(1) of the Act, 21 U.S.C. section 360bbb-3(b)(1), unless the authorization is terminated or revoked.     Resp Syncytial Virus by PCR NEGATIVE NEGATIVE Final    Comment: (NOTE) Fact Sheet for Patients: BloggerCourse.com  Fact Sheet for Healthcare Providers: SeriousBroker.it  This test is not yet approved or cleared by the [place]  FDA and has been authorized for detection and/or diagnosis of SARS-CoV-2 by FDA under an Emergency Use Authorization (EUA). This EUA will remain in effect (meaning this test can be used) for the duration of the COVID-19 declaration under Section 564(b)(1) of the Act, 21 U.S.C. section 360bbb-3(b)(1), unless the authorization is terminated or revoked.  Performed at [facility], [address] [place], [place] [address]   MRSA Next Gen by PCR, Nasal     Status: None   Collection Time: 06/29/24  4:49 PM   Specimen: Nasal Mucosa; Nasal  Swab  Result Value Ref Range Status   MRSA by PCR Next Gen NOT DETECTED NOT DETECTED Final    Comment: (NOTE) The GeneXpert MRSA Assay (FDA approved for NASAL specimens only), is one component of a comprehensive MRSA colonization surveillance program. It is not intended to diagnose MRSA infection nor to guide or monitor treatment for MRSA infections. Test performance is not FDA approved in patients less than 38 years old. Performed at [facility], [address], [place], [place] [address]      Labs: CBC: Recent Labs  Lab 06/28/24 1909 06/29/24 0355  WBC 4.6 6.5  NEUTROABS 2.5  --   HGB 13.4 13.7  HCT 40.8 41.9  MCV 91.7 89.9  PLT 233 262   Basic Metabolic Panel: Recent Labs  Lab 06/28/24 1909 06/29/24 0355 06/30/24 0634  NA 139 139 138  K 4.4 3.5 3.6  CL 109 104 97*  CO2 25 25 29   GLUCOSE 99 145* 131*  BUN 19 17 20   CREATININE 1.44* 1.44* 1.64*  CALCIUM  8.9 8.8* 9.3  MG  --  1.9 1.8  PHOS  --   --  3.0   Liver Function Tests: Recent Labs  Lab 06/28/24 1909 06/30/24 0634  AST 33  --   ALT 32  --   ALKPHOS 69  --   BILITOT 0.4  --   PROT 6.1*  --   ALBUMIN 3.1* 3.2*   No results for input(s): LIPASE, AMYLASE in the last 168 hours. No results for input(s): AMMONIA in the last 168 hours. Cardiac Enzymes: No results for input(s): CKTOTAL, CKMB, CKMBINDEX, TROPONINI in the last 168 hours. BNP (last 3 results) Recent Labs    11/16/23 1520 06/28/24 1901 06/29/24 0355  BNP 788.9* 627.8* 822.9*   CBG: Recent Labs  Lab 06/29/24 1714 06/29/24 2104 06/30/24 0557 06/30/24 0739 06/30/24 1134  GLUCAP 124* 131* 113* 198* 91    Time spent: 35 minutes  Signed:  Elvan Sor  [facility] 06/30/2024 12:56 PM

## 2024-07-01 ENCOUNTER — Telehealth: Payer: Self-pay | Admitting: *Deleted

## 2024-07-01 NOTE — Transitions of Care (Post Inpatient/ED Visit) (Signed)
   07/01/2024  Name: Matthew [name]. MRN: [id number] DOB: September 15, 1966  Today's TOC FU Call Status: Today's TOC FU Call Status:: Unsuccessful Call (1st Attempt) Unsuccessful Call (1st Attempt) Date: 07/01/24  Attempted to reach the patient regarding the most recent Inpatient/ED visit.  Follow Up Plan: Additional outreach attempts will be made to reach the patient to complete the Transitions of Care (Post Inpatient/ED visit) call.   Andrea Dimes RN, BSN [place]  [facility] RN Care Manager [phone]

## 2024-07-02 ENCOUNTER — Telehealth: Payer: Self-pay | Admitting: *Deleted

## 2024-07-02 NOTE — Transitions of Care (Post Inpatient/ED Visit) (Signed)
   07/02/2024  Name: Matthew [name]. MRN: [id number] DOB: 06/10/1966  Today's TOC FU Call Status: Today's TOC FU Call Status:: Unsuccessful Call (2nd Attempt) Unsuccessful Call (2nd Attempt) Date: 07/02/24  Attempted to reach the patient regarding the most recent Inpatient/ED visit.  Follow Up Plan: Additional outreach attempts will be made to reach the patient to complete the Transitions of Care (Post Inpatient/ED visit) call.   Andrea Dimes RN, BSN [place]  [facility] RN Care Manager [phone]

## 2024-07-03 ENCOUNTER — Telehealth: Payer: Self-pay | Admitting: *Deleted

## 2024-07-03 NOTE — Transitions of Care (Post Inpatient/ED Visit) (Signed)
   07/03/2024  Name: Matthew [name]. MRN: [id number] DOB: September 23, 1966  Today's TOC FU Call Status: Today's TOC FU Call Status:: Unsuccessful Call (3rd Attempt) Unsuccessful Call (3rd Attempt) Date: 07/03/24  Attempted to reach the patient regarding the most recent Inpatient/ED visit.  Follow Up Plan: No further outreach attempts will be made at this time. We have been unable to contact the patient.  Andrea Dimes RN, BSN [place]  [facility] RN Care Manager [phone]

## 2024-08-26 ENCOUNTER — Telehealth (HOSPITAL_COMMUNITY): Payer: Self-pay | Admitting: Cardiology

## 2024-09-01 ENCOUNTER — Telehealth (HOSPITAL_COMMUNITY): Payer: Self-pay

## 2024-09-01 NOTE — Telephone Encounter (Signed)
 Called to confirm/remind patient of their appointment at the [facility] on 09/02/24 2:00.   Appointment:   [x] Confirmed  [] Left mess   [] No answer/No voice mail  [] VM Full/unable to leave message  [] Phone not in service  Patient reminded to bring all medications and/or complete list.  Confirmed patient has transportation. Gave directions, instructed to utilize valet parking.

## 2024-09-02 ENCOUNTER — Encounter (HOSPITAL_COMMUNITY)

## 2024-09-02 NOTE — Progress Notes (Incomplete)
 [facility] Note    PCP: [name], MD HF Cardiologist: Dr. Rolan  HPI: Matthew [name].is a 58 y.o. with PMH significant for combined systolic CHF with biventricular failure, T2DM, HTN, OSA, tobacco use, HLD, cocaine use, poor medication adherence, depression, CVA in 2018, and CKD 3a.    Diagnosed with HF in 2015 in the setting of long standing uncontrolled hypertension and smoking. LHC with normal cors.    Multiple admission in 2021-2022 for CHF exacerbation in the setting of noncompliance with medications.   Admitted 1/22 with CHF exacerbation after running out of his medications.  He was diuresed, put on an excellent medication regimen. He also endorsed suicidal ideation at that time and was subsequently admitted to behavioral health. UDS was positive for cocaine, echo showed EF < 20% with moderate RV dysfunction.   Echo 5/22 with EF improved to 50%.   In clinic 9/22 and in acute HF, again off his meds. Had ran out of all of his meds, including lasix  ~3 months prior. ReDs 46%. Given Lasix  80 mg IV x 1 in the office, GDMT adjusted. Instructed to f/u in 1 week but no showed to the appt.   Follow up 11/22 he had been off several medications, more SOB, continued to use cocaine. Mediations restarted, pillbox filled. Seen back 12/22, stable NYHA III, volume OK ReDs 35%. Spiro restarted and advised 4 week follow up. No show x 2 appts.   Echo in 1/23 with EF 30-35%, mild LVH, normal RV.   Admitted 3/23 with SI and auditory hallucinations.   Last seen in AHF Clinic 11/24. He had been doing relatively well, out of some of his BP medications at the time. Admitted to hospital 3 days later with acute respiratory failure requiring BiPAP in the setting of cocaine use.   Has had 2 admissions for CHF exacerbation in the last year. Most recently 06/28/24 in the setting of cocaine use, noncompliance, and hypertensive urgency   He returns today for heart failure follow up.  Overall feeling ***. NYHA ***. Reports {Symptoms; cardiac:12860::dyspnea,fatigue}. Denies {Symptoms; cardiac:12860::chest pain,dyspnea,fatigue,near-syncope,orthopnea,palpitations,dizziness,abnormal bleeding}. Able to perform ADLs. Appetite okay. Weight at home ***. BP at home***. Compliant with all medications.    PMH: 1. HTN 2. Type 2 diabetes 3. H/o cocaine abuse: Positive UDS 1/22.  4. CVA 2018 5. OSA: Uses CPAP.  6. Chronic systolic CHF: Nonischemic cardiomyopathy.  - LHC (2015): Normal coronaries.  - Echo (2021): EF 40-45%. - Echo (1/22): EF <20%, moderate LV dilation, moderately decreased RV systolic function, severe LAE.  - Echo (5/22): EF 50%, moderate LV dilation, normal RV.  - Echo (1/23): EF 30-35%, moderately decreased LV, normal RV, ascending aorta 39 mm - Echo today: read pending 7. Depression 8. CKD stage 3 9. Hyperlipidemia  Current Outpatient Medications  Medication Sig Dispense Refill   amLODipine  (NORVASC ) 10 MG tablet Take 1 tablet (10 mg total) by mouth daily. Skip the dose if systolic BP less than 130 mmHg 30 tablet 11   atorvastatin  (LIPITOR ) 80 MG tablet Take 1 tablet (80 mg total) by mouth daily. 30 tablet 3   ezetimibe  (ZETIA ) 10 MG tablet Take 1 tablet (10 mg total) by mouth daily. 30 tablet 3   furosemide  (LASIX ) 40 MG tablet Take 1 tablet (40 mg total) by mouth daily. 30 tablet 3   gabapentin  (NEURONTIN ) 100 MG capsule Take 1 capsule (100 mg total) by mouth every 8 (eight) hours. 90 capsule 3   hydrOXYzine  (ATARAX ) 10 MG  tablet Take 1 tablet (10 mg total) by mouth 3 (three) times daily as needed for itching or anxiety. 30 tablet 0   losartan  (COZAAR ) 100 MG tablet Take 1 tablet (100 mg total) by mouth daily. 30 tablet 3   melatonin 5 MG TABS Take 1 tablet (5 mg total) by mouth at bedtime.     sertraline  (ZOLOFT ) 100 MG tablet Take 1.5 tablets (150 mg total) by mouth daily. 45 tablet 0   spironolactone  (ALDACTONE ) 25 MG tablet Take 1  tablet (25 mg total) by mouth daily. 30 tablet 3   timolol  (TIMOPTIC ) 0.5 % ophthalmic solution Place 1 drop into both eyes daily after breakfast. 5 mL 0   No current facility-administered medications for this visit.   No Known Allergies  Social History   Socioeconomic History   Marital status: Widowed    Spouse name: Not on file   Number of children: 2   Years of education: 14   Highest education level: Not on file  Occupational History   Occupation: [employer]  Tobacco Use   Smoking status: Every Day    Types: Cigarettes   Smokeless tobacco: Former    Quit date: 11/14/2020  Vaping Use   Vaping status: Never Used  Substance and Sexual Activity   Alcohol use: No   Drug use: Yes    Types: Cocaine   Sexual activity: Yes  Other Topics Concern   Not on file  Social History Narrative   Lives with 2 young children in [place].  Wife died in 2020-05-12.  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   Left-handed   Caffeine: occasional tea   Social Drivers of Corporate investment banker Strain: High Risk (01/24/2021)   Overall Financial Resource Strain (CARDIA)    Difficulty of Paying Living Expenses: Very hard  Food Insecurity: No Food Insecurity (06/29/2024)   Hunger Vital Sign    Worried About Running Out of Food in the Last Year: Never true    Ran Out of Food in the Last Year: Never true  Transportation Needs: No Transportation Needs (06/29/2024)   PRAPARE - Administrator, Civil Service (Medical): No    Lack of Transportation (Non-Medical): No  Physical Activity: Not on file  Stress: Stress Concern Present (01/24/2021)   [employer] of Occupational Health - Occupational Stress Questionnaire    Feeling of Stress : Very much  Social Connections: Unknown (05/01/2022)   Received from [facility]   Social Network    Social Network: Not on file  Intimate Partner Violence: Not At Risk (06/29/2024)   Humiliation, Afraid, Rape, and Kick  questionnaire    Fear of Current or Ex-Partner: No    Emotionally Abused: No    Physically Abused: No    Sexually Abused: No   Family History  Problem Relation Age of Onset   Heart attack Father        died @ 16   Cervical cancer Mother        died @ 26   There were no vitals taken for this visit.  Wt Readings from Last 3 Encounters:  06/29/24 65.1 kg (143 lb 8.3 oz)  01/22/24 77.1 kg (170 lb)  11/20/23 71.2 kg (156 lb 15.5 oz)   PHYSICAL EXAM: General: Well appearing. No distress on RA Cardiac: JVP ***. S1 and S2 present. No murmurs or rub. Resp: Lung sounds clear and equal B/L Abdomen: Soft, non-tender, non-distended.  Extremities:  Warm and dry.  *** edema.  Neuro: Alert and oriented x3. Affect pleasant. Moves all extremities without difficulty.  ASSESSMENT & PLAN: 1. 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  Due to poorly controlled HTN +/- cocaine abuse.  Normal coronaries on 2015 cath.  Echo in 1/22 showed EF < 20% with moderate RV dysfunction (off meds, + cocaine).  Echo in 5/22 with EF up to 50% (on meds and off cocaine). Echo 1/23 (off meds and on cocaine) EF down to 30-35%. Echo 7/24 EF 25-30%, GIDD, RV normal, LA mildly dilated, small pericardial effusion present. He is now living at [facility] and is off drugs and ETOH.  NYHA class I, mildly volume overloaded on exam - Increase lasix  20>40 mg daily - Has not been on Entresto  2/2 headache? per patient report. Will not restart today with elevated renal function - Continue spironolactone  25 mg daily.  - Restart Coreg  6.25 mg bid. Will check UDS.  - Restart BiDil  1 tab tid. - Did not tolerate Farxiga  due to dizziness  - EF remains low on most recent echo. Will refer to EP for possible ICD placement.   - Discussed importance of med compliance. BMET, BNP today  2. HTN: BP mildly elevated, GDMT as above. Has not had meds this morning.    3. OSA: Not compliant with CPAP, needs new one. Will arrange sleep study for  new machine. Has not scheduled f/u.   4. Tobacco Use Disorder: He has quit smoking.   5. Cocaine Use Disorder:  He has quit using cocaine.  UDS today, very fidgety/antsy on exam.  6. CKD 3: Check BMET today   7. Hyperlipidemia: Continue atorvastatin .  - LDL 16 8/23. Up to 103 7/24.   8. Depression: Controlled.   9. T2DM: management per PCP. - Did not tolerate Farxiga  due to dizziness.  10. 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  Follow up in 4-6 weeks with APP to reassess volume.  Follow up in *** with ***  [place] Baptiste Littler, NP 09/02/24

## 2024-09-17 ENCOUNTER — Other Ambulatory Visit (HOSPITAL_COMMUNITY): Payer: Self-pay

## 2024-09-18 ENCOUNTER — Other Ambulatory Visit (HOSPITAL_COMMUNITY): Payer: Self-pay

## 2024-09-18 ENCOUNTER — Other Ambulatory Visit: Payer: Self-pay

## 2024-09-21 ENCOUNTER — Other Ambulatory Visit (HOSPITAL_COMMUNITY): Payer: Self-pay

## 2024-09-22 ENCOUNTER — Other Ambulatory Visit (HOSPITAL_COMMUNITY): Payer: Self-pay

## 2024-09-22 ENCOUNTER — Other Ambulatory Visit: Payer: Self-pay

## 2024-09-23 ENCOUNTER — Other Ambulatory Visit (HOSPITAL_COMMUNITY): Payer: Self-pay

## 2024-11-29 ENCOUNTER — Other Ambulatory Visit: Payer: Self-pay

## 2024-11-29 ENCOUNTER — Encounter (HOSPITAL_COMMUNITY): Payer: Self-pay | Admitting: *Deleted

## 2024-11-29 ENCOUNTER — Emergency Department (HOSPITAL_COMMUNITY)

## 2024-11-29 ENCOUNTER — Inpatient Hospital Stay (HOSPITAL_COMMUNITY)
Admission: EM | Admit: 2024-11-29 | Discharge: 2024-12-03 | DRG: 291 | Disposition: A | Discharge status: Home / Self Care | Attending: Internal Medicine | Admitting: Internal Medicine

## 2024-11-29 DIAGNOSIS — I160 Hypertensive urgency: Secondary | ICD-10-CM | POA: Diagnosis present

## 2024-11-29 DIAGNOSIS — H5461 Unqualified visual loss, right eye, normal vision left eye: Secondary | ICD-10-CM | POA: Diagnosis present

## 2024-11-29 DIAGNOSIS — M545 Low back pain, unspecified: Secondary | ICD-10-CM | POA: Diagnosis present

## 2024-11-29 DIAGNOSIS — F411 Generalized anxiety disorder: Secondary | ICD-10-CM | POA: Diagnosis present

## 2024-11-29 DIAGNOSIS — E1169 Type 2 diabetes mellitus with other specified complication: Secondary | ICD-10-CM

## 2024-11-29 DIAGNOSIS — F419 Anxiety disorder, unspecified: Secondary | ICD-10-CM

## 2024-11-29 DIAGNOSIS — I130 Hypertensive heart and chronic kidney disease with heart failure and stage 1 through stage 4 chronic kidney disease, or unspecified chronic kidney disease: Principal | ICD-10-CM | POA: Diagnosis present

## 2024-11-29 DIAGNOSIS — Z91148 Patient's other noncompliance with medication regimen for other reason: Secondary | ICD-10-CM

## 2024-11-29 DIAGNOSIS — Z9841 Cataract extraction status, right eye: Secondary | ICD-10-CM

## 2024-11-29 DIAGNOSIS — Z79899 Other long term (current) drug therapy: Secondary | ICD-10-CM

## 2024-11-29 DIAGNOSIS — Z8673 Personal history of transient ischemic attack (TIA), and cerebral infarction without residual deficits: Secondary | ICD-10-CM

## 2024-11-29 DIAGNOSIS — N1831 Chronic kidney disease, stage 3a: Secondary | ICD-10-CM | POA: Diagnosis present

## 2024-11-29 DIAGNOSIS — F17210 Nicotine dependence, cigarettes, uncomplicated: Secondary | ICD-10-CM | POA: Diagnosis present

## 2024-11-29 DIAGNOSIS — Z1152 Encounter for screening for COVID-19: Secondary | ICD-10-CM

## 2024-11-29 DIAGNOSIS — F1410 Cocaine abuse, uncomplicated: Secondary | ICD-10-CM | POA: Diagnosis present

## 2024-11-29 DIAGNOSIS — G4733 Obstructive sleep apnea (adult) (pediatric): Secondary | ICD-10-CM | POA: Diagnosis present

## 2024-11-29 DIAGNOSIS — E1122 Type 2 diabetes mellitus with diabetic chronic kidney disease: Secondary | ICD-10-CM | POA: Diagnosis present

## 2024-11-29 DIAGNOSIS — I509 Heart failure, unspecified: Principal | ICD-10-CM

## 2024-11-29 DIAGNOSIS — I2510 Atherosclerotic heart disease of native coronary artery without angina pectoris: Secondary | ICD-10-CM | POA: Diagnosis present

## 2024-11-29 DIAGNOSIS — I5042 Chronic combined systolic (congestive) and diastolic (congestive) heart failure: Secondary | ICD-10-CM

## 2024-11-29 DIAGNOSIS — Z8249 Family history of ischemic heart disease and other diseases of the circulatory system: Secondary | ICD-10-CM

## 2024-11-29 DIAGNOSIS — E7800 Pure hypercholesterolemia, unspecified: Secondary | ICD-10-CM | POA: Diagnosis present

## 2024-11-29 DIAGNOSIS — I110 Hypertensive heart disease with heart failure: Secondary | ICD-10-CM

## 2024-11-29 DIAGNOSIS — I5043 Acute on chronic combined systolic (congestive) and diastolic (congestive) heart failure: Secondary | ICD-10-CM | POA: Diagnosis present

## 2024-11-29 NOTE — ED Triage Notes (Addendum)
 Pt here via [facility] from home for sob, intermittent x 1 week.  Hx of chf and panic attacks, pt is hyperventilating and nauseous.  Pt also c/o lower back pain, severe headache and weak legs.  States chills earlier today.  VS: Bp 140/palp Hr 66 Rr 26 Sats 97% RA

## 2024-11-30 ENCOUNTER — Emergency Department (HOSPITAL_COMMUNITY)

## 2024-11-30 ENCOUNTER — Encounter (HOSPITAL_COMMUNITY): Payer: Self-pay | Admitting: Family Medicine

## 2024-11-30 DIAGNOSIS — N182 Chronic kidney disease, stage 2 (mild): Secondary | ICD-10-CM | POA: Diagnosis not present

## 2024-11-30 DIAGNOSIS — Z8673 Personal history of transient ischemic attack (TIA), and cerebral infarction without residual deficits: Secondary | ICD-10-CM

## 2024-11-30 DIAGNOSIS — F411 Generalized anxiety disorder: Secondary | ICD-10-CM | POA: Diagnosis present

## 2024-11-30 DIAGNOSIS — I2510 Atherosclerotic heart disease of native coronary artery without angina pectoris: Secondary | ICD-10-CM | POA: Diagnosis present

## 2024-11-30 DIAGNOSIS — Z91148 Patient's other noncompliance with medication regimen for other reason: Secondary | ICD-10-CM | POA: Diagnosis not present

## 2024-11-30 DIAGNOSIS — F1410 Cocaine abuse, uncomplicated: Secondary | ICD-10-CM | POA: Diagnosis present

## 2024-11-30 DIAGNOSIS — Z79899 Other long term (current) drug therapy: Secondary | ICD-10-CM | POA: Diagnosis not present

## 2024-11-30 DIAGNOSIS — E1122 Type 2 diabetes mellitus with diabetic chronic kidney disease: Secondary | ICD-10-CM | POA: Diagnosis present

## 2024-11-30 DIAGNOSIS — Z9841 Cataract extraction status, right eye: Secondary | ICD-10-CM | POA: Diagnosis not present

## 2024-11-30 DIAGNOSIS — N1831 Chronic kidney disease, stage 3a: Secondary | ICD-10-CM | POA: Diagnosis present

## 2024-11-30 DIAGNOSIS — F17210 Nicotine dependence, cigarettes, uncomplicated: Secondary | ICD-10-CM | POA: Diagnosis present

## 2024-11-30 DIAGNOSIS — M545 Low back pain, unspecified: Secondary | ICD-10-CM | POA: Diagnosis present

## 2024-11-30 DIAGNOSIS — G4733 Obstructive sleep apnea (adult) (pediatric): Secondary | ICD-10-CM | POA: Diagnosis present

## 2024-11-30 DIAGNOSIS — Z1152 Encounter for screening for COVID-19: Secondary | ICD-10-CM | POA: Diagnosis not present

## 2024-11-30 DIAGNOSIS — Z8249 Family history of ischemic heart disease and other diseases of the circulatory system: Secondary | ICD-10-CM | POA: Diagnosis not present

## 2024-11-30 DIAGNOSIS — I5043 Acute on chronic combined systolic (congestive) and diastolic (congestive) heart failure: Secondary | ICD-10-CM | POA: Diagnosis present

## 2024-11-30 DIAGNOSIS — H5461 Unqualified visual loss, right eye, normal vision left eye: Secondary | ICD-10-CM | POA: Diagnosis present

## 2024-11-30 DIAGNOSIS — I130 Hypertensive heart and chronic kidney disease with heart failure and stage 1 through stage 4 chronic kidney disease, or unspecified chronic kidney disease: Secondary | ICD-10-CM | POA: Diagnosis present

## 2024-11-30 DIAGNOSIS — I160 Hypertensive urgency: Secondary | ICD-10-CM | POA: Diagnosis present

## 2024-11-30 DIAGNOSIS — I5023 Acute on chronic systolic (congestive) heart failure: Secondary | ICD-10-CM | POA: Diagnosis present

## 2024-11-30 DIAGNOSIS — E7800 Pure hypercholesterolemia, unspecified: Secondary | ICD-10-CM | POA: Diagnosis present

## 2024-11-30 LAB — CBC
HCT: 41.1 % (ref 39.0–52.0)
HCT: 41.2 % (ref 39.0–52.0)
Hemoglobin: 13.6 g/dL (ref 13.0–17.0)
Hemoglobin: 13.8 g/dL (ref 13.0–17.0)
MCH: 30.3 pg (ref 26.0–34.0)
MCH: 30.7 pg (ref 26.0–34.0)
MCHC: 33.1 g/dL (ref 30.0–36.0)
MCHC: 33.5 g/dL (ref 30.0–36.0)
MCV: 90.4 fL (ref 80.0–100.0)
MCV: 92.8 fL (ref 80.0–100.0)
Platelets: 212 K/uL (ref 150–400)
Platelets: 225 K/uL (ref 150–400)
RBC: 4.43 MIL/uL (ref 4.22–5.81)
RBC: 4.56 MIL/uL (ref 4.22–5.81)
RDW: 13.2 % (ref 11.5–15.5)
RDW: 13.2 % (ref 11.5–15.5)
WBC: 5 K/uL (ref 4.0–10.5)
WBC: 5.5 K/uL (ref 4.0–10.5)
nRBC: 0 % (ref 0.0–0.2)
nRBC: 0 % (ref 0.0–0.2)

## 2024-11-30 LAB — PROCALCITONIN: Procalcitonin: <0.1 ng/mL

## 2024-11-30 LAB — BASIC METABOLIC PANEL WITH GFR
Anion gap: 12 (ref 5–15)
Anion gap: 13 (ref 5–15)
Anion gap: 8 (ref 5–15)
BUN: 17 mg/dL (ref 6–20)
BUN: 18 mg/dL (ref 6–20)
BUN: 26 mg/dL — ABNORMAL HIGH (ref 6–20)
CO2: 25 mmol/L (ref 22–32)
CO2: 26 mmol/L (ref 22–32)
CO2: 27 mmol/L (ref 22–32)
Calcium: 9.1 mg/dL (ref 8.9–10.3)
Calcium: 9.4 mg/dL (ref 8.9–10.3)
Calcium: 9.4 mg/dL (ref 8.9–10.3)
Chloride: 101 mmol/L (ref 98–111)
Chloride: 103 mmol/L (ref 98–111)
Chloride: 107 mmol/L (ref 98–111)
Creatinine, Ser: 1.31 mg/dL — ABNORMAL HIGH (ref 0.61–1.24)
Creatinine, Ser: 1.41 mg/dL — ABNORMAL HIGH (ref 0.61–1.24)
Creatinine, Ser: 1.68 mg/dL — ABNORMAL HIGH (ref 0.61–1.24)
GFR, Estimated: 47 mL/min — ABNORMAL LOW (ref >60)
GFR, Estimated: 58 mL/min — ABNORMAL LOW (ref >60)
GFR, Estimated: >60 mL/min (ref >60)
Glucose, Bld: 120 mg/dL — ABNORMAL HIGH (ref 70–99)
Glucose, Bld: 170 mg/dL — ABNORMAL HIGH (ref 70–99)
Glucose, Bld: 183 mg/dL — ABNORMAL HIGH (ref 70–99)
Potassium: 3.7 mmol/L (ref 3.5–5.1)
Potassium: 3.7 mmol/L (ref 3.5–5.1)
Potassium: 4.1 mmol/L (ref 3.5–5.1)
Sodium: 139 mmol/L (ref 135–145)
Sodium: 141 mmol/L (ref 135–145)
Sodium: 142 mmol/L (ref 135–145)

## 2024-11-30 LAB — MAGNESIUM
Magnesium: 1.7 mg/dL (ref 1.7–2.4)
Magnesium: 1.5 mg/dL — ABNORMAL LOW (ref 1.7–2.4)

## 2024-11-30 LAB — URINALYSIS, ROUTINE W REFLEX MICROSCOPIC
Bacteria, UA: NONE SEEN
Bilirubin Urine: NEGATIVE
Glucose, UA: NEGATIVE mg/dL
Hgb urine dipstick: NEGATIVE
Ketones, ur: NEGATIVE mg/dL
Leukocytes,Ua: NEGATIVE
Nitrite: NEGATIVE
Protein, ur: 30 mg/dL — AB
Specific Gravity, Urine: 1.009 (ref 1.005–1.030)
pH: 8 (ref 5.0–8.0)

## 2024-11-30 LAB — HEPATIC FUNCTION PANEL
ALT: 24 U/L (ref 0–44)
AST: 20 U/L (ref 15–41)
Albumin: 1.9 g/dL — ABNORMAL LOW (ref 3.5–5.0)
Alkaline Phosphatase: 62 U/L (ref 38–126)
Bilirubin, Direct: 0.1 mg/dL (ref 0.0–0.2)
Indirect Bilirubin: 0.3 mg/dL (ref 0.3–0.9)
Total Bilirubin: 0.4 mg/dL (ref 0.0–1.2)
Total Protein: 3.8 g/dL — ABNORMAL LOW (ref 6.5–8.1)

## 2024-11-30 LAB — BRAIN NATRIURETIC PEPTIDE: B Natriuretic Peptide: 1160.2 pg/mL — ABNORMAL HIGH (ref 0.0–100.0)

## 2024-11-30 LAB — RESP PANEL BY RT-PCR (RSV, FLU A&B, COVID)  RVPGX2
Influenza A by PCR: NEGATIVE
Influenza B by PCR: NEGATIVE
Resp Syncytial Virus by PCR: NEGATIVE
SARS Coronavirus 2 by RT PCR: NEGATIVE

## 2024-11-30 LAB — TROPONIN I (HIGH SENSITIVITY)
Troponin I (High Sensitivity): 25 ng/L — ABNORMAL HIGH (ref <18)
Troponin I (High Sensitivity): 37 ng/L — ABNORMAL HIGH (ref <18)

## 2024-11-30 LAB — HIV ANTIBODY (ROUTINE TESTING W REFLEX): HIV Screen 4th Generation wRfx: NONREACTIVE

## 2024-11-30 MED ORDER — IOHEXOL 350 MG/ML SOLN
75.0000 mL | Freq: Once | INTRAVENOUS | Status: AC | PRN
  Administered 2024-11-30: 02:00:00 75 mL via INTRAVENOUS

## 2024-11-30 MED ORDER — ENOXAPARIN SODIUM 40 MG/0.4ML IJ SOSY
40.0000 mg | PREFILLED_SYRINGE | Freq: Every day | INTRAMUSCULAR | Status: DC
  Administered 2024-11-30 – 2024-12-02 (×3): 40 mg via SUBCUTANEOUS
  Filled 2024-11-30 (×4): qty 0.4

## 2024-11-30 MED ORDER — SODIUM CHLORIDE 0.9 % IV SOLN
1.0000 g | Freq: Once | INTRAVENOUS | Status: AC
  Administered 2024-11-30: 03:00:00 1 g via INTRAVENOUS
  Filled 2024-11-30: qty 10

## 2024-11-30 MED ORDER — PROCHLORPERAZINE EDISYLATE 10 MG/2ML IJ SOLN: 5.0000 mg | Freq: Four times a day (QID) | INTRAMUSCULAR | Status: DC | PRN

## 2024-11-30 MED ORDER — FUROSEMIDE 10 MG/ML IJ SOLN: 40.0000 mg | Freq: Two times a day (BID) | INTRAMUSCULAR | Status: DC

## 2024-11-30 MED ORDER — DOXYCYCLINE HYCLATE 100 MG PO TABS
100.0000 mg | ORAL_TABLET | Freq: Once | ORAL | Status: AC
  Administered 2024-11-30: 02:00:00 100 mg via ORAL
  Filled 2024-11-30: qty 1

## 2024-11-30 MED ORDER — OXYCODONE HCL 5 MG PO TABS
5.0000 mg | ORAL_TABLET | ORAL | Status: DC | PRN
  Administered 2024-11-30 (×2): 5 mg via ORAL
  Administered 2024-12-01 – 2024-12-02 (×3): 10 mg via ORAL
  Filled 2024-11-30: qty 1
  Filled 2024-11-30 (×2): qty 2
  Filled 2024-11-30: qty 1
  Filled 2024-11-30: qty 2

## 2024-11-30 MED ORDER — ACETAMINOPHEN 325 MG PO TABS
650.0000 mg | ORAL_TABLET | Freq: Once | ORAL | Status: AC
  Administered 2024-11-30: 02:00:00 650 mg via ORAL
  Filled 2024-11-30: qty 2

## 2024-11-30 MED ORDER — ATORVASTATIN CALCIUM 80 MG PO TABS
80.0000 mg | ORAL_TABLET | Freq: Every day | ORAL | Status: AC
  Administered 2024-11-30 – 2024-12-03 (×4): 80 mg via ORAL
  Filled 2024-11-30 (×2): qty 1
  Filled 2024-11-30: qty 2
  Filled 2024-11-30: qty 1

## 2024-11-30 MED ORDER — HYDRALAZINE HCL 50 MG PO TABS: 50.0000 mg | ORAL_TABLET | Freq: Four times a day (QID) | ORAL | Status: DC | PRN

## 2024-11-30 MED ORDER — TIMOLOL MALEATE 0.5 % OP SOLN
1.0000 [drp] | Freq: Every day | OPHTHALMIC | Status: DC
  Administered 2024-11-30 – 2024-12-03 (×3): 1 [drp] via OPHTHALMIC
  Filled 2024-11-30 (×2): qty 5

## 2024-11-30 MED ORDER — MELATONIN 5 MG PO TABS
5.0000 mg | ORAL_TABLET | Freq: Every day | ORAL | Status: AC
  Administered 2024-11-30 – 2024-12-02 (×3): 5 mg via ORAL
  Filled 2024-11-30 (×3): qty 1

## 2024-11-30 MED ORDER — SERTRALINE HCL 50 MG PO TABS
50.0000 mg | ORAL_TABLET | Freq: Every day | ORAL | Status: DC
  Administered 2024-11-30 – 2024-12-03 (×4): 50 mg via ORAL
  Filled 2024-11-30 (×4): qty 1

## 2024-11-30 MED ORDER — ACETAMINOPHEN 325 MG PO TABS
650.0000 mg | ORAL_TABLET | Freq: Four times a day (QID) | ORAL | Status: DC | PRN
  Administered 2024-12-01: 21:00:00 650 mg via ORAL
  Filled 2024-11-30: qty 2

## 2024-11-30 MED ORDER — SENNOSIDES-DOCUSATE SODIUM 8.6-50 MG PO TABS: 1.0000 | ORAL_TABLET | Freq: Every evening | ORAL | Status: DC | PRN

## 2024-11-30 MED ORDER — FUROSEMIDE 10 MG/ML IJ SOLN
40.0000 mg | Freq: Once | INTRAMUSCULAR | Status: AC
  Administered 2024-11-30: 02:00:00 40 mg via INTRAVENOUS
  Filled 2024-11-30: qty 4

## 2024-11-30 MED ORDER — HYDRALAZINE HCL 25 MG PO TABS
25.0000 mg | ORAL_TABLET | Freq: Four times a day (QID) | ORAL | Status: DC | PRN
  Administered 2024-11-30: 04:00:00 25 mg via ORAL
  Filled 2024-11-30: qty 1

## 2024-11-30 MED ORDER — SODIUM CHLORIDE 0.9% FLUSH
3.0000 mL | Freq: Two times a day (BID) | INTRAVENOUS | Status: DC
  Administered 2024-11-30 – 2024-12-03 (×7): 3 mL via INTRAVENOUS

## 2024-11-30 MED ORDER — NITROGLYCERIN 2 % TD OINT
1.0000 [in_us] | TOPICAL_OINTMENT | Freq: Once | TRANSDERMAL | Status: AC
  Administered 2024-11-30: 03:00:00 1 [in_us] via TOPICAL
  Filled 2024-11-30: qty 1

## 2024-11-30 MED ORDER — AMLODIPINE BESYLATE 10 MG PO TABS
10.0000 mg | ORAL_TABLET | Freq: Every day | ORAL | Status: DC
  Administered 2024-12-01 – 2024-12-03 (×3): 10 mg via ORAL
  Filled 2024-11-30 (×2): qty 1
  Filled 2024-11-30: qty 2

## 2024-11-30 MED ORDER — GABAPENTIN 100 MG PO CAPS
100.0000 mg | ORAL_CAPSULE | Freq: Three times a day (TID) | ORAL | Status: DC
  Administered 2024-11-30 – 2024-12-03 (×10): 100 mg via ORAL
  Filled 2024-11-30 (×10): qty 1

## 2024-11-30 MED ORDER — ASPIRIN 81 MG PO TBEC
81.0000 mg | DELAYED_RELEASE_TABLET | Freq: Every day | ORAL | Status: AC
  Administered 2024-11-30 – 2024-12-03 (×4): 81 mg via ORAL
  Filled 2024-11-30 (×4): qty 1

## 2024-11-30 MED ORDER — LOSARTAN POTASSIUM 50 MG PO TABS
100.0000 mg | ORAL_TABLET | Freq: Every day | ORAL | Status: DC
  Administered 2024-11-30 – 2024-12-01 (×2): 100 mg via ORAL
  Filled 2024-11-30 (×3): qty 2

## 2024-11-30 MED ORDER — ACETAMINOPHEN 650 MG RE SUPP: 650.0000 mg | Freq: Four times a day (QID) | RECTAL | Status: DC | PRN

## 2024-11-30 MED ORDER — FUROSEMIDE 10 MG/ML IJ SOLN
40.0000 mg | Freq: Two times a day (BID) | INTRAMUSCULAR | Status: DC
  Administered 2024-11-30 – 2024-12-01 (×4): 40 mg via INTRAVENOUS
  Filled 2024-11-30 (×5): qty 4

## 2024-11-30 MED ORDER — LORAZEPAM 2 MG/ML IJ SOLN
0.5000 mg | Freq: Once | INTRAMUSCULAR | Status: AC
  Administered 2024-11-30: 03:00:00 0.5 mg via INTRAVENOUS
  Filled 2024-11-30: qty 1

## 2024-11-30 MED ORDER — AMLODIPINE BESYLATE 5 MG PO TABS
5.0000 mg | ORAL_TABLET | Freq: Once | ORAL | Status: AC
  Administered 2024-11-30: 03:00:00 5 mg via ORAL
  Filled 2024-11-30: qty 1

## 2024-11-30 MED ORDER — SPIRONOLACTONE 25 MG PO TABS
25.0000 mg | ORAL_TABLET | Freq: Every day | ORAL | Status: AC
  Administered 2024-11-30 – 2024-12-03 (×4): 25 mg via ORAL
  Filled 2024-11-30 (×2): qty 1
  Filled 2024-11-30: qty 2
  Filled 2024-11-30: qty 1

## 2024-11-30 MED ORDER — HYDROXYZINE HCL 10 MG PO TABS
10.0000 mg | ORAL_TABLET | Freq: Three times a day (TID) | ORAL | Status: DC | PRN
  Administered 2024-11-30 – 2024-12-03 (×3): 10 mg via ORAL
  Filled 2024-11-30 (×3): qty 1

## 2024-11-30 NOTE — Progress Notes (Signed)
 Matthew [name]. is a 58 y.o. male with medical history significant for hypertension, type 2 diabetes mellitus, history of CVA, CKD stage II, cocaine abuse, OSA, and cardiomyopathy with EF less than 20% who presented with shortness of breath.   Patient reports roughly 1 week of shortness of breath.  He has not been coughing much at all.  He reports a mild ankle swelling, more so on the left.  He has not noticed any orthopnea.  He last used crack cocaine on 11/27/2024.  He has been out of his medications for at least a week.   ED Course: Upon arrival to the ED, patient is found to be afebrile and saturating well on room air with elevated RR, normal HR, and severely elevated BP.  Labs are most notable for creatinine 1.30, albumin 1.9, normal CBC, troponin 25, and BNP 1160.  CTA chest is negative for PE but notable for mild bilateral airspace opacities most suggestive of edema.   Patient was treated in the ED with amlodipine , Ativan , acetaminophen , nitroglycerin  ointment, IV Lasix , Rocephin , and doxycycline .  I saw patient early in the morning, still in ED.  He said that he is feeling only a little better today but still with shortness of breath.  On examination, he has bilateral crackles.  No other positive finding on the examination.  Patient has no chest pain, fever or chills.  We will continue IV Lasix  twice daily as ordered by the admitting hospitalist with a strict I's and O's and low-sodium diet and daily weight.  Continue antihypertensives as well as atorvastatin .  Total time spent 36 minutes

## 2024-11-30 NOTE — Progress Notes (Signed)
°   11/30/24 0419  BiPAP/CPAP/SIPAP  BiPAP/CPAP/SIPAP Pt Type Adult  BiPAP/CPAP/SIPAP Resmed  Reason BIPAP/CPAP not in use Non-compliant (pt refused, cpap on standby)  Patient Home Machine No  Patient Home Mask No  Patient Home Tubing No  Auto Titrate No  BiPAP/CPAP /SiPAP Vitals  Resp (!) 25  Bilateral Breath Sounds Clear;Diminished

## 2024-11-30 NOTE — ED Provider Notes (Signed)
 [place] EMERGENCY DEPARTMENT AT [facility] Provider Note   CSN: [id number] Arrival date & time: 11/29/24  2246     Patient presents with: Shortness of Breath   Matthew [name]. is a 58 y.o. male.   The history is provided by the patient.  Shortness of Breath Matthew [name]. is a 57 y.o. male who presents to the Emergency Department complaining of difficulty reading.  He presents to the emergency department for evaluation of 1 week of shortness of breath with associated cough.  He also reports 1 week of low back pain, atraumatic.  He is urinating well.  He feels hot but has not checked his temperature.  He did vomit twice earlier in the week but no vomiting since then.  No diarrhea.  No abdominal pain.  He states that he is unable to eat because he feels ill.  He does report that he had foot swelling earlier in the week but this is now better.  No associated chest pain but feels uncomfortable in his chest.  He denies tobacco, alcohol use.  He does report using crack cocaine on Friday.  He also reports not taking his medications for about 1 week.      Prior to Admission medications  Medication Sig Start Date End Date Taking? Authorizing Provider  amLODipine  (NORVASC ) 10 MG tablet Take 1 tablet (10 mg total) by mouth daily. Skip the dose if systolic BP less than 130 mmHg 07/01/24 07/01/25  Von Bellis, MD  atorvastatin  (LIPITOR ) 80 MG tablet Take 1 tablet (80 mg total) by mouth daily. 06/30/24   Von Bellis, MD  ezetimibe  (ZETIA ) 10 MG tablet Take 1 tablet (10 mg total) by mouth daily. 06/30/24 07/30/24  Von Bellis, MD  furosemide  (LASIX ) 40 MG tablet Take 1 tablet (40 mg total) by mouth daily. 06/30/24 06/30/25  Von Bellis, MD  gabapentin  (NEURONTIN ) 100 MG capsule Take 1 capsule (100 mg total) by mouth every 8 (eight) hours. 06/30/24   Von Bellis, MD  hydrOXYzine  (ATARAX ) 10 MG tablet Take 1 tablet (10 mg total) by mouth 3 (three) times daily as needed for  itching or anxiety. 06/30/24   Von Bellis, MD  losartan  (COZAAR ) 100 MG tablet Take 1 tablet (100 mg total) by mouth daily. 06/30/24 06/30/25  Von Bellis, MD  melatonin 5 MG TABS Take 1 tablet (5 mg total) by mouth at bedtime. 01/17/24   Massengill, Rankin, MD  sertraline  (ZOLOFT ) 100 MG tablet Take 1.5 tablets (150 mg total) by mouth daily. 06/30/24   Von Bellis, MD  spironolactone  (ALDACTONE ) 25 MG tablet Take 1 tablet (25 mg total) by mouth daily. 06/30/24 06/30/25  Von Bellis, MD  timolol  (TIMOPTIC ) 0.5 % ophthalmic solution Place 1 drop into both eyes daily after breakfast. 06/30/24   Von Bellis, MD  lovastatin  (MEVACOR ) 20 MG tablet Take 2 tablets (40 mg total) by mouth at bedtime. 06/24/14 08/26/14  Advani, Deepak, MD    Allergies: Patient has no known allergies.    Review of Systems  Respiratory:  Positive for shortness of breath.   All other systems reviewed and are negative.   Updated Vital Signs BP (!) 190/145   Pulse 86   Temp 97.7 F (36.5 C) (Oral)   Resp (!) 27   Ht 5' 9 (1.753 m)   Wt 65.1 kg   SpO2 100%   BMI 21.19 kg/m   Physical Exam Vitals and nursing note reviewed.  Constitutional:      Appearance:  He is well-developed.  HENT:     Head: Normocephalic and atraumatic.  Cardiovascular:     Rate and Rhythm: Normal rate and regular rhythm.     Heart sounds: No murmur heard. Pulmonary:     Comments: Tachypnea.  Difficult to auscultate lung sounds due to patient moaning. Abdominal:     Palpations: Abdomen is soft.     Tenderness: There is no abdominal tenderness. There is no guarding or rebound.  Musculoskeletal:        General: No tenderness.     Comments: No midline lumbar spine tenderness  Skin:    General: Skin is warm and dry.  Neurological:     Mental Status: He is alert and oriented to person, place, and time.     Comments: 5 out of 5 strength in all 4 extremities  Psychiatric:        Behavior: Behavior normal.     (all labs ordered are  listed, but only abnormal results are displayed) Labs Reviewed  BASIC METABOLIC PANEL WITH GFR - Abnormal; Notable for the following components:      Result Value   Glucose, Bld 120 (*)    Creatinine, Ser 1.31 (*)    All other components within normal limits  BRAIN NATRIURETIC PEPTIDE - Abnormal; Notable for the following components:   B Natriuretic Peptide 1,160.2 (*)    All other components within normal limits  URINALYSIS, ROUTINE W REFLEX MICROSCOPIC - Abnormal; Notable for the following components:   Color, Urine STRAW (*)    Protein, ur 30 (*)    All other components within normal limits  HEPATIC FUNCTION PANEL - Abnormal; Notable for the following components:   Total Protein 3.8 (*)    Albumin 1.9 (*)    All other components within normal limits  TROPONIN I (HIGH SENSITIVITY) - Abnormal; Notable for the following components:   Troponin I (High Sensitivity) 25 (*)    All other components within normal limits  RESP PANEL BY RT-PCR (RSV, FLU A&B, COVID)  RVPGX2  CBC  PROCALCITONIN  TROPONIN I (HIGH SENSITIVITY)    EKG: EKG Interpretation Date/Time:  Sunday November 29 2024 23:15:21 EST Ventricular Rate:  75 PR Interval:  184 QRS Duration:  98 QT Interval:  388 QTC Calculation: 433 R Axis:   94  Text Interpretation: Sinus rhythm with occasional Premature ventricular complexes Possible Left atrial enlargement Rightward axis Biventricular hypertrophy with repolarization abnormality Abnormal ECG Confirmed by Griselda Norris [phone]) on 11/30/2024 3:22:40 AM  Radiology: CT Angio Chest PE W/Cm &/Or Wo Cm Result Date: 11/30/2024 EXAM: CTA of the Chest with contrast for PE 11/30/2024 02:02:10 AM TECHNIQUE: CTA of the chest was performed after the administration of 75 mL iohexol  (OMNIPAQUE ) 350 MG/ML injection. Multiplanar reformatted images are provided for review. MIP images are provided for review. Automated exposure control, iterative reconstruction, and/or weight based  adjustment of the mA/kV was utilized to reduce the radiation dose to as low as reasonably achievable. COMPARISON: 02/04/2017, chest x-ray from the previous day. CLINICAL HISTORY: Shortness of breath FINDINGS: PULMONARY ARTERIES: The pulmonary artery shows a normal branching pattern bilaterally. No filling defect is noted to suggest pulmonary embolism. Main pulmonary artery is normal in caliber. MEDIASTINUM: The heart is at the upper limits of normal in size. Mild coronary calcifications are seen. Atherosclerotic calcifications of the aorta are seen without aneurysmal dilatation. The degree of opacification is limited with regards to dissection evaluation. The esophagus is within normal limits. The thoracic inlet is within  normal limits. LYMPH NODES: No sizable hilar or mediastinal adenopathy is noted. No axillary lymphadenopathy. LUNGS AND PLEURA: The lungs are well aerated bilaterally. Mild increased airspace opacity is noted, left slightly greater than right, similar to that seen on the prior exam, most consistent with edema. No focal effusion is seen. No pneumothorax. UPPER ABDOMEN: Upper abdomen shows no acute abnormality. SOFT TISSUES AND BONES: No acute soft tissue abnormality. No bony abnormality is noted. IMPRESSION: 1. No pulmonary embolism. 2. Mild bilateral airspace opacity, left greater than right, most consistent with pulmonary edema, unchanged from prior. 3. No pleural effusion. Electronically signed by: Oneil Devonshire MD 11/30/2024 02:13 AM EST RP Workstation: [id number]   DG Chest 2 View Result Date: 11/29/2024 EXAM: 2 VIEW(S) XRAY OF THE CHEST 11/29/2024 11:35:00 PM COMPARISON: 06/28/2024 CLINICAL HISTORY: SOB FINDINGS: LUNGS AND PLEURA: Trace bilateral pleural effusions. Perihilar vascular fullness and prominent interstitial markings. Faint left upper and lower lobe perihilar airspace opacity. No pneumothorax. HEART AND MEDIASTINUM: Cardiomegaly. BONES AND SOFT TISSUES: No acute osseous  abnormality. IMPRESSION: 1. Faint left upper and lower lobe perihilar airspace opacity. 2. Trace bilateral pleural effusions. 3. Cardiomegaly. Electronically signed by: Greig Pique MD 11/29/2024 11:43 PM EST RP Workstation: [id number]     Procedures   Medications Ordered in the ED  acetaminophen  (TYLENOL ) tablet 650 mg (650 mg Oral Given 11/30/24 0219)  LORazepam  (ATIVAN ) injection 0.5 mg (0.5 mg Intravenous Given 11/30/24 0239)  cefTRIAXone  (ROCEPHIN ) 1 g in sodium chloride  0.9 % 100 mL IVPB (0 g Intravenous Stopped 11/30/24 0312)  doxycycline  (VIBRA -TABS) tablet 100 mg (100 mg Oral Given 11/30/24 0219)  furosemide  (LASIX ) injection 40 mg (40 mg Intravenous Given 11/30/24 0209)  iohexol  (OMNIPAQUE ) 350 MG/ML injection 75 mL (75 mLs Intravenous Contrast Given 11/30/24 0202)  nitroGLYCERIN  (NITROGLYN) 2 % ointment 1 inch (1 inch Topical Given 11/30/24 0315)  amLODipine  (NORVASC ) tablet 5 mg (5 mg Oral Given 11/30/24 0315)                                    Medical Decision Making Amount and/or Complexity of Data Reviewed Labs: ordered. Radiology: ordered.  Risk OTC drugs. Prescription drug management. Decision regarding hospitalization.   Patient with history of CHF, cocaine use, hypertension here for evaluation of 1 week of shortness of breath, cough.  He is uncomfortable, tachypneic on examination without respiratory distress.  EKG with LVH changes, similar when compared to priors, troponin is minimally elevated, similar when compared to priors.  His BNP is increased compared to most recent.  Overall he appears euvolemic on examination.  Chest x-ray with possible infiltrates, he was treated with antibiotics.  CTA was obtained, which is negative for PE, CT is concerning for pulmonary edema over pneumonia.  He was treated with furosemide  for diuresis.  He was given Nitropaste for blood pressure as well as amlodipine .  Beta-blockers were held at this time given his recent cocaine use.   Patient does have ongoing tachypnea.  Plan to admit for CHF exacerbation.  Patient updated of findings of studies and that he is in agreement with admission.  Hospitalist consulted for admission.     Final diagnoses:  Acute congestive heart failure, unspecified heart failure type [facility])  Noncompliance with medication regimen    ED Discharge Orders     None          Griselda Norris, MD 11/30/24 [phone]

## 2024-11-30 NOTE — Progress Notes (Signed)
 Heart Failure Navigator Progress Note  Assessed for Heart & Vascular TOC clinic readiness.  Patient does not meet criteria due to he is an Advanced Heart Failure team patient of Dr. Rolan. .   Navigator will sign off at this time.   Stephane Haddock, BSN, Scientist, Clinical (histocompatibility And Immunogenetics) Only

## 2024-11-30 NOTE — H&P (Signed)
 History and Physical    Matthew [name]. FMW:[id number] DOB: [birth year] DOA: 11/29/2024  PCP: [name], MD   Patient coming from: Home   Chief Complaint: SOB  HPI: Gorden Stthomas. is a 58 y.o. male with medical history significant for hypertension, type 2 diabetes mellitus, history of CVA, CKD stage II, cocaine abuse, OSA, and cardiomyopathy with EF less than 20% who presents with shortness of breath.  Patient reports roughly 1 week of shortness of breath.  He has not been coughing much at all.  He reports a mild ankle swelling, more so on the left.  He has not noticed any orthopnea.  He last used crack cocaine on 11/27/2024.  He has been out of his medications for at least a week.  ED Course: Upon arrival to the ED, patient is found to be afebrile and saturating well on room air with elevated RR, normal HR, and severely elevated BP.  Labs are most notable for creatinine 1.30, albumin 1.9, normal CBC, troponin 25, and BNP 1160.  CTA chest is negative for PE but notable for mild bilateral airspace opacities most suggestive of edema.  Patient was treated in the ED with amlodipine , Ativan , acetaminophen , nitroglycerin  ointment, IV Lasix , Rocephin , and doxycycline .  Review of Systems:  All other systems reviewed and apart from HPI, are negative.  Past Medical History:  Diagnosis Date   Acute combined systolic and diastolic CHF, NYHA class 3 (HCC)    a. 02/2014 Echo: EF 25-30%.   Blindness of right eye    Cardiomyopathy (HCC)    a. 02/2014 Echo: EF 25-30%, sev glob HK with inferolat HK->AK, mod conc LVH, Gr 2 DD, Mild MR, sev dil LA.   CKD (chronic kidney disease), stage III (HCC)    Cocaine abuse (HCC)    Depression    DM (diabetes mellitus), type 2 (HCC)    GAD (generalized anxiety disorder)    HFrEF (heart failure with reduced ejection fraction) (HCC)    High cholesterol    Hypertension    Morbid obesity (HCC)    OSA (obstructive sleep apnea)    Stroke (HCC)     Tobacco abuse     Past Surgical History:  Procedure Laterality Date   CATARACT EXTRACTION Right    LEFT HEART CATHETERIZATION WITH CORONARY ANGIOGRAM N/A 03/04/2014   Procedure: LEFT HEART CATHETERIZATION WITH CORONARY ANGIOGRAM;  Surgeon: Lonni JONETTA Cash, MD;  Location: [facility] CATH LAB;  Service: Cardiovascular;  Laterality: N/A;   TEE WITHOUT CARDIOVERSION N/A 02/04/2017   Procedure: TRANSESOPHAGEAL ECHOCARDIOGRAM (TEE);  Surgeon: Leim VEAR Moose, MD;  Location: [facility] ENDOSCOPY;  Service: Cardiovascular;  Laterality: N/A;    Social History:   reports that he has been smoking cigarettes. He quit smokeless tobacco use about 4 years ago. He reports current drug use. Drug: Cocaine. He reports that he does not drink alcohol.  Allergies[1]  Family History  Problem Relation Age of Onset   Heart attack Father        died @ 61   Cervical cancer Mother        died @ 42     Prior to Admission medications  Medication Sig Start Date End Date Taking? Authorizing Provider  amLODipine  (NORVASC ) 10 MG tablet Take 1 tablet (10 mg total) by mouth daily. Skip the dose if systolic BP less than 130 mmHg 07/01/24 07/01/25  Von Bellis, MD  atorvastatin  (LIPITOR ) 80 MG tablet Take 1 tablet (80 mg total) by mouth daily. 06/30/24  Von Bellis, MD  ezetimibe  (ZETIA ) 10 MG tablet Take 1 tablet (10 mg total) by mouth daily. 06/30/24 07/30/24  Von Bellis, MD  furosemide  (LASIX ) 40 MG tablet Take 1 tablet (40 mg total) by mouth daily. 06/30/24 06/30/25  Von Bellis, MD  gabapentin  (NEURONTIN ) 100 MG capsule Take 1 capsule (100 mg total) by mouth every 8 (eight) hours. 06/30/24   Von Bellis, MD  hydrOXYzine  (ATARAX ) 10 MG tablet Take 1 tablet (10 mg total) by mouth 3 (three) times daily as needed for itching or anxiety. 06/30/24   Von Bellis, MD  losartan  (COZAAR ) 100 MG tablet Take 1 tablet (100 mg total) by mouth daily. 06/30/24 06/30/25  Von Bellis, MD  melatonin 5 MG TABS Take 1 tablet (5 mg total)  by mouth at bedtime. 01/17/24   Massengill, Rankin, MD  sertraline  (ZOLOFT ) 100 MG tablet Take 1.5 tablets (150 mg total) by mouth daily. 06/30/24   Von Bellis, MD  spironolactone  (ALDACTONE ) 25 MG tablet Take 1 tablet (25 mg total) by mouth daily. 06/30/24 06/30/25  Von Bellis, MD  timolol  (TIMOPTIC ) 0.5 % ophthalmic solution Place 1 drop into both eyes daily after breakfast. 06/30/24   Von Bellis, MD  lovastatin  (MEVACOR ) 20 MG tablet Take 2 tablets (40 mg total) by mouth at bedtime. 06/24/14 08/26/14  Colton Drape, MD    Physical Exam: Vitals:   11/30/24 0300 11/30/24 0315 11/30/24 0334 11/30/24 0351  BP: (!) 184/135 (!) 190/145 (!) 176/127 (!) 192/130  Pulse: 86  88   Resp:   (!) 25   Temp:      TempSrc:      SpO2: 100%  99%   Weight:      Height:        Constitutional: NAD, no pallor or diaphoresis   Eyes: PERTLA, lids and conjunctivae normal ENMT: Mucous membranes are moist. Posterior pharynx clear of any exudate or lesions.   Neck: supple, no masses  Respiratory: Tachypnea. Dyspnea with speech.   Cardiovascular: S1 & S2 heard, regular rate and rhythm. Mild ankle edema.  Abdomen: No tenderness, soft. Bowel sounds active.  Musculoskeletal: no clubbing / cyanosis. No joint deformity upper and lower extremities.   Skin: no significant rashes, lesions, ulcers. Warm, dry, well-perfused. Neurologic: No gross facial asymmetry. Moving all extremities. Alert and oriented.  Psychiatric: Calm. Cooperative.    Labs and Imaging on Admission: I have personally reviewed following labs and imaging studies  CBC: Recent Labs  Lab 11/29/24 2357  WBC 5.0  HGB 13.6  HCT 41.1  MCV 92.8  PLT 212   Basic Metabolic Panel: Recent Labs  Lab 11/29/24 2357  NA 142  K 4.1  CL 107  CO2 27  GLUCOSE 120*  BUN 17  CREATININE 1.31*  CALCIUM  9.4   GFR: Estimated Creatinine Clearance: 57.3 mL/min (A) (by C-G formula based on SCr of 1.31 mg/dL (H)). Liver Function Tests: Recent Labs   Lab 11/30/24 0212  AST 20  ALT 24  ALKPHOS 62  BILITOT 0.4  PROT 3.8*  ALBUMIN 1.9*   No results for input(s): LIPASE, AMYLASE in the last 168 hours. No results for input(s): AMMONIA in the last 168 hours. Coagulation Profile: No results for input(s): INR, PROTIME in the last 168 hours. Cardiac Enzymes: No results for input(s): CKTOTAL, CKMB, CKMBINDEX, TROPONINI in the last 168 hours. BNP (last 3 results) No results for input(s): PROBNP in the last 8760 hours. HbA1C: No results for input(s): HGBA1C in the last 72 hours. CBG: No  results for input(s): GLUCAP in the last 168 hours. Lipid Profile: No results for input(s): CHOL, HDL, LDLCALC, TRIG, CHOLHDL, LDLDIRECT in the last 72 hours. Thyroid  Function Tests: No results for input(s): TSH, T4TOTAL, FREET4, T3FREE, THYROIDAB in the last 72 hours. Anemia Panel: No results for input(s): VITAMINB12, FOLATE, FERRITIN, TIBC, IRON, RETICCTPCT in the last 72 hours. Urine analysis:    Component Value Date/Time   COLORURINE STRAW (A) 11/30/2024 0144   APPEARANCEUR CLEAR 11/30/2024 0144   LABSPEC 1.009 11/30/2024 0144   PHURINE 8.0 11/30/2024 0144   GLUCOSEU NEGATIVE 11/30/2024 0144   HGBUR NEGATIVE 11/30/2024 0144   BILIRUBINUR NEGATIVE 11/30/2024 0144   KETONESUR NEGATIVE 11/30/2024 0144   PROTEINUR 30 (A) 11/30/2024 0144   NITRITE NEGATIVE 11/30/2024 0144   LEUKOCYTESUR NEGATIVE 11/30/2024 0144   Sepsis Labs: @LABRCNTIP (procalcitonin:4,lacticidven:4) ) Recent Results (from the past 240 hours)  Resp panel by RT-PCR (RSV, Flu A&B, Covid) Anterior Nasal Swab     Status: None   Collection Time: 11/29/24 11:40 PM   Specimen: Anterior Nasal Swab  Result Value Ref Range Status   SARS Coronavirus 2 by RT PCR NEGATIVE NEGATIVE Final   Influenza A by PCR NEGATIVE NEGATIVE Final   Influenza B by PCR NEGATIVE NEGATIVE Final    Comment: (NOTE) The Xpert Xpress  SARS-CoV-2/FLU/RSV plus assay is intended as an aid in the diagnosis of influenza from Nasopharyngeal swab specimens and should not be used as a sole basis for treatment. Nasal washings and aspirates are unacceptable for Xpert Xpress SARS-CoV-2/FLU/RSV testing.  Fact Sheet for Patients: bloggercourse.com  Fact Sheet for Healthcare Providers: seriousbroker.it  This test is not yet approved or cleared by the [place]  FDA and has been authorized for detection and/or diagnosis of SARS-CoV-2 by FDA under an Emergency Use Authorization (EUA). This EUA will remain in effect (meaning this test can be used) for the duration of the COVID-19 declaration under Section 564(b)(1) of the Act, 21 U.S.C. section 360bbb-3(b)(1), unless the authorization is terminated or revoked.     Resp Syncytial Virus by PCR NEGATIVE NEGATIVE Final    Comment: (NOTE) Fact Sheet for Patients: bloggercourse.com  Fact Sheet for Healthcare Providers: seriousbroker.it  This test is not yet approved or cleared by the [place]  FDA and has been authorized for detection and/or diagnosis of SARS-CoV-2 by FDA under an Emergency Use Authorization (EUA). This EUA will remain in effect (meaning this test can be used) for the duration of the COVID-19 declaration under Section 564(b)(1) of the Act, 21 U.S.C. section 360bbb-3(b)(1), unless the authorization is terminated or revoked.  Performed at [facility], [address], [place], [place] [address]      Radiological Exams on Admission: CT Angio Chest PE W/Cm &/Or Wo Cm Result Date: 11/30/2024 EXAM: CTA of the Chest with contrast for PE 11/30/2024 02:02:10 AM TECHNIQUE: CTA of the chest was performed after the administration of 75 mL iohexol  (OMNIPAQUE ) 350 MG/ML injection. Multiplanar reformatted images are provided for review. MIP images are provided for  review. Automated exposure control, iterative reconstruction, and/or weight based adjustment of the mA/kV was utilized to reduce the radiation dose to as low as reasonably achievable. COMPARISON: 02/04/2017, chest x-ray from the previous day. CLINICAL HISTORY: Shortness of breath FINDINGS: PULMONARY ARTERIES: The pulmonary artery shows a normal branching pattern bilaterally. No filling defect is noted to suggest pulmonary embolism. Main pulmonary artery is normal in caliber. MEDIASTINUM: The heart is at the upper limits of normal in size. Mild coronary calcifications  are seen. Atherosclerotic calcifications of the aorta are seen without aneurysmal dilatation. The degree of opacification is limited with regards to dissection evaluation. The esophagus is within normal limits. The thoracic inlet is within normal limits. LYMPH NODES: No sizable hilar or mediastinal adenopathy is noted. No axillary lymphadenopathy. LUNGS AND PLEURA: The lungs are well aerated bilaterally. Mild increased airspace opacity is noted, left slightly greater than right, similar to that seen on the prior exam, most consistent with edema. No focal effusion is seen. No pneumothorax. UPPER ABDOMEN: Upper abdomen shows no acute abnormality. SOFT TISSUES AND BONES: No acute soft tissue abnormality. No bony abnormality is noted. IMPRESSION: 1. No pulmonary embolism. 2. Mild bilateral airspace opacity, left greater than right, most consistent with pulmonary edema, unchanged from prior. 3. No pleural effusion. Electronically signed by: Oneil Devonshire MD 11/30/2024 02:13 AM EST RP Workstation: [id number]   DG Chest 2 View Result Date: 11/29/2024 EXAM: 2 VIEW(S) XRAY OF THE CHEST 11/29/2024 11:35:00 PM COMPARISON: 06/28/2024 CLINICAL HISTORY: SOB FINDINGS: LUNGS AND PLEURA: Trace bilateral pleural effusions. Perihilar vascular fullness and prominent interstitial markings. Faint left upper and lower lobe perihilar airspace opacity. No pneumothorax. HEART  AND MEDIASTINUM: Cardiomegaly. BONES AND SOFT TISSUES: No acute osseous abnormality. IMPRESSION: 1. Faint left upper and lower lobe perihilar airspace opacity. 2. Trace bilateral pleural effusions. 3. Cardiomegaly. Electronically signed by: Greig Pique MD 11/29/2024 11:43 PM EST RP Workstation: [id number]    EKG: Independently reviewed. Sinus rhythm, PVCs, RAD, LVH.   Assessment/Plan   1. Acute oh chronic HFrEF  - EF was <20% on echo from July 2025  - Presents with worsening SOB after going at least 1 wk without medications and found to have increased BNP and imaging concerning for pulmonary edema  - Continue diuresis with IV Lasix , resume losartan  and Aldactone , follow daily weight and strict I/Os    2. Hypertensive urgency   - Severely hypertensive in setting of medications non-adherence and hypervolemia  - Continue diuresis, resume home medications   3. Hx of CAD  - Resume aspirin  and statin    4. CKD II  - Appears to be at baseline  - Monitor renal function and electrolytes while diuresing    5. Anxiety  - Resume Zoloft  and as-needed hydroxyzine    6. OSA  - CPAP while sleeping    DVT prophylaxis: Lovenox   Code Status: Full  Level of Care: Level of care: Telemetry Family Communication: None present   Disposition Plan:  Patient is from: Home  Anticipated d/c is to: Home  Anticipated d/c date is: 12/03/24  Patient currently: Pending improved respiratory and volume status  Consults called: None  Admission status: Inpatient     Evalene GORMAN Sprinkles, MD [facility]  11/30/2024, 3:56 AM       [1] No Known Allergies

## 2024-11-30 NOTE — ED Notes (Signed)
 Patient transported to CT

## 2024-11-30 NOTE — Progress Notes (Signed)
°   11/30/24 2030  BiPAP/CPAP/SIPAP  BiPAP/CPAP/SIPAP Pt Type Adult  Reason BIPAP/CPAP not in use Non-compliant (pt refused CPAP)

## 2024-12-01 DIAGNOSIS — I5023 Acute on chronic systolic (congestive) heart failure: Secondary | ICD-10-CM | POA: Diagnosis not present

## 2024-12-01 LAB — BASIC METABOLIC PANEL WITH GFR
Anion gap: 12 (ref 5–15)
BUN: 35 mg/dL — ABNORMAL HIGH (ref 6–20)
CO2: 28 mmol/L (ref 22–32)
Calcium: 9.6 mg/dL (ref 8.9–10.3)
Chloride: 99 mmol/L (ref 98–111)
Creatinine, Ser: 1.48 mg/dL — ABNORMAL HIGH (ref 0.61–1.24)
GFR, Estimated: 55 mL/min — ABNORMAL LOW (ref >60)
Glucose, Bld: 122 mg/dL — ABNORMAL HIGH (ref 70–99)
Potassium: 4.4 mmol/L (ref 3.5–5.1)
Sodium: 139 mmol/L (ref 135–145)

## 2024-12-01 LAB — CBC WITH DIFFERENTIAL/PLATELET
Abs Immature Granulocytes: 0 K/uL (ref 0.00–0.07)
Basophils Absolute: 0 K/uL (ref 0.0–0.1)
Basophils Relative: 1 %
Eosinophils Absolute: 0.1 K/uL (ref 0.0–0.5)
Eosinophils Relative: 2 %
HCT: 44.2 % (ref 39.0–52.0)
Hemoglobin: 15.3 g/dL (ref 13.0–17.0)
Immature Granulocytes: 0 %
Lymphocytes Relative: 49 %
Lymphs Abs: 1.7 K/uL (ref 0.7–4.0)
MCH: 30.7 pg (ref 26.0–34.0)
MCHC: 34.6 g/dL (ref 30.0–36.0)
MCV: 88.6 fL (ref 80.0–100.0)
Monocytes Absolute: 0.4 K/uL (ref 0.1–1.0)
Monocytes Relative: 12 %
Neutro Abs: 1.3 K/uL — ABNORMAL LOW (ref 1.7–7.7)
Neutrophils Relative %: 36 %
Platelets: 259 K/uL (ref 150–400)
RBC: 4.99 MIL/uL (ref 4.22–5.81)
RDW: 13.1 % (ref 11.5–15.5)
WBC: 3.5 K/uL — ABNORMAL LOW (ref 4.0–10.5)
nRBC: 0 % (ref 0.0–0.2)

## 2024-12-01 LAB — MAGNESIUM: Magnesium: 1.8 mg/dL (ref 1.7–2.4)

## 2024-12-01 MED ORDER — HYDRALAZINE HCL 50 MG PO TABS: 50.0000 mg | ORAL_TABLET | Freq: Four times a day (QID) | ORAL | Status: DC | PRN

## 2024-12-01 NOTE — Progress Notes (Signed)
 PROGRESS NOTE    Matthew [name].  FMW:[id number] DOB: [birth year] DOA: 11/29/2024 PCP: [name], MD   Brief Narrative:  Matthew [name]. is a 58 y.o. male with medical history significant for hypertension, type 2 diabetes mellitus, history of CVA, CKD stage II, cocaine abuse, OSA, and cardiomyopathy with EF less than 20% who presented with shortness of breath and ankle swelling for about 1 week but no coughing.  No orthopnea.  He last used crack cocaine on 11/27/2024.  He has been out of his medications for at least a week.   Upon arrival to the ED, patient is found to be afebrile and saturating well on room air with elevated RR, normal HR, and severely elevated BP. CTA chest is negative for PE but notable for mild bilateral airspace opacities most suggestive of edema.  Admitted for acute exacerbation of CHF.    Assessment & Plan:   Principal Problem:   Acute on chronic HFrEF (heart failure with reduced ejection fraction) (HCC) Active Problems:   CKD (chronic kidney disease) stage 2, GFR 60-89 ml/min   History of stroke   Hypertensive urgency   GAD (generalized anxiety disorder)   Cocaine abuse (HCC)  1. Acute on chronic HFrEF  - EF was <20% on echo from July 2025, CT chest concerning for pulm edema, started on IV Lasix  40 mg IV twice daily yesterday, good diuresis, shortness of breath improving but has not completely resolved.  Very faint crackles today, much improved compared to yesterday.  Plan to continue IV Lasix  with strict I's and O's, daily weight and low-sodium diet.   2. Hypertensive urgency   - Severely hypertensive in setting of medications non-adherence and hypervolemia  Resumed amlodipine  and losartan .  Blood pressure improved but still elevated.  Will place on as needed hydralazine  for now.   3. Hx of CAD  Continue aspirin  and statin.  No complaint of chest pain.   4.  CKD stage IIIa - Baseline creatinine ranges anywhere from 1.4-1.6, currently very  close to baseline.  Monitor closely.  Avoid nephrotoxic agents but he needs to continue Lasix .   5. Anxiety  - Continue Zoloft  and as-needed hydroxyzine     6. OSA  - CPAP while sleeping   7.  Hypomagnesemia: replenished yesterday.  Rechecking labs today.  DVT prophylaxis: enoxaparin  (LOVENOX ) injection 40 mg Start: 11/30/24 1000   Code Status: Full Code  Family Communication:  None present at bedside.  Plan of care discussed with patient in length and he/she verbalized understanding and agreed with it.  Status is: Inpatient Remains inpatient appropriate because: Still symptomatic, needs IV diuresis.   Estimated body mass index is 21.19 kg/m as calculated from the following:   Height as of this encounter: 5' 9 (1.753 m).   Weight as of this encounter: 65.1 kg.    Nutritional Assessment: Body mass index is 21.19 kg/m.SABRA Seen by dietician.  I agree with the assessment and plan as outlined below: Nutrition Status:        . Skin Assessment: I have examined the patient's skin and I agree with the wound assessment as performed by the wound care RN as outlined below:    Consultants:  None  Procedures:  None  Antimicrobials:  Anti-infectives (From admission, onward)    Start     Dose/Rate Route Frequency Ordered Stop   11/30/24 0145  cefTRIAXone  (ROCEPHIN ) 1 g in sodium chloride  0.9 % 100 mL IVPB        1  g 200 mL/hr over 30 Minutes Intravenous  Once 11/30/24 0137 11/30/24 0312   11/30/24 0145  doxycycline  (VIBRA -TABS) tablet 100 mg        100 mg Oral  Once 11/30/24 0137 11/30/24 0219         Subjective: Seen and examined, he still complains of not feeling well, generalized body ache and shortness of breath.  No chest pain or other complaint.  Objective: Vitals:   12/01/24 0514 12/01/24 0515 12/01/24 0838 12/01/24 0910  BP:  (!) 158/116 (!) 162/127 (!) 143/102  Pulse:  61 68   Resp:  17 (!) 30   Temp: 97.8 F (36.6 C)  97.8 F (36.6 C)   TempSrc: Oral   Oral   SpO2:  100% 99%   Weight:      Height:        Intake/Output Summary (Last 24 hours) at 12/01/2024 1055 Last data filed at 12/01/2024 0517 Gross per 24 hour  Intake 1150 ml  Output 1000 ml  Net 150 ml   Filed Weights   11/29/24 2300  Weight: 65.1 kg    Examination:  General exam: Appears calm and comfortable  Respiratory system: Very faint left basilar crackles. Cardiovascular system: S1 & S2 heard, RRR. No JVD, murmurs, rubs, gallops or clicks. No pedal edema. Gastrointestinal system: Abdomen is nondistended, soft and nontender. No organomegaly or masses felt. Normal bowel sounds heard. Central nervous system: Alert and oriented. No focal neurological deficits. Extremities: Symmetric 5 x 5 power. Skin: No rashes, lesions or ulcers Psychiatry: Judgement and insight appear normal. Mood & affect appropriate.    Data Reviewed: I have personally reviewed following labs and imaging studies  CBC: Recent Labs  Lab 11/29/24 2357 11/30/24 0614 12/01/24 0937  WBC 5.0 5.5 3.5*  NEUTROABS  --   --  1.3*  HGB 13.6 13.8 15.3  HCT 41.1 41.2 44.2  MCV 92.8 90.4 88.6  PLT 212 225 259   Basic Metabolic Panel: Recent Labs  Lab 11/29/24 2357 11/30/24 0614 11/30/24 2020  NA 142 141 139  K 4.1 3.7 3.7  CL 107 103 101  CO2 27 25 26   GLUCOSE 120* 183* 170*  BUN 17 18 26*  CREATININE 1.31* 1.41* 1.68*  CALCIUM  9.4 9.4 9.1  MG  --  1.7 1.5*   GFR: Estimated Creatinine Clearance: 44.7 mL/min (A) (by C-G formula based on SCr of 1.68 mg/dL (H)). Liver Function Tests: Recent Labs  Lab 11/30/24 0212  AST 20  ALT 24  ALKPHOS 62  BILITOT 0.4  PROT 3.8*  ALBUMIN 1.9*   No results for input(s): LIPASE, AMYLASE in the last 168 hours. No results for input(s): AMMONIA in the last 168 hours. Coagulation Profile: No results for input(s): INR, PROTIME in the last 168 hours. Cardiac Enzymes: No results for input(s): CKTOTAL, CKMB, CKMBINDEX, TROPONINI in  the last 168 hours. BNP (last 3 results) No results for input(s): PROBNP in the last 8760 hours. HbA1C: No results for input(s): HGBA1C in the last 72 hours. CBG: No results for input(s): GLUCAP in the last 168 hours. Lipid Profile: No results for input(s): CHOL, HDL, LDLCALC, TRIG, CHOLHDL, LDLDIRECT in the last 72 hours. Thyroid  Function Tests: No results for input(s): TSH, T4TOTAL, FREET4, T3FREE, THYROIDAB in the last 72 hours. Anemia Panel: No results for input(s): VITAMINB12, FOLATE, FERRITIN, TIBC, IRON, RETICCTPCT in the last 72 hours. Sepsis Labs: Recent Labs  Lab 11/30/24 0614  PROCALCITON <0.10    Recent Results (from the past  240 hours)  Resp panel by RT-PCR (RSV, Flu A&B, Covid) Anterior Nasal Swab     Status: None   Collection Time: 11/29/24 11:40 PM   Specimen: Anterior Nasal Swab  Result Value Ref Range Status   SARS Coronavirus 2 by RT PCR NEGATIVE NEGATIVE Final   Influenza A by PCR NEGATIVE NEGATIVE Final   Influenza B by PCR NEGATIVE NEGATIVE Final    Comment: (NOTE) The Xpert Xpress SARS-CoV-2/FLU/RSV plus assay is intended as an aid in the diagnosis of influenza from Nasopharyngeal swab specimens and should not be used as a sole basis for treatment. Nasal washings and aspirates are unacceptable for Xpert Xpress SARS-CoV-2/FLU/RSV testing.  Fact Sheet for Patients: bloggercourse.com  Fact Sheet for Healthcare Providers: seriousbroker.it  This test is not yet approved or cleared by the [place]  FDA and has been authorized for detection and/or diagnosis of SARS-CoV-2 by FDA under an Emergency Use Authorization (EUA). This EUA will remain in effect (meaning this test can be used) for the duration of the COVID-19 declaration under Section 564(b)(1) of the Act, 21 U.S.C. section 360bbb-3(b)(1), unless the authorization is terminated or revoked.     Resp  Syncytial Virus by PCR NEGATIVE NEGATIVE Final    Comment: (NOTE) Fact Sheet for Patients: bloggercourse.com  Fact Sheet for Healthcare Providers: seriousbroker.it  This test is not yet approved or cleared by the [place]  FDA and has been authorized for detection and/or diagnosis of SARS-CoV-2 by FDA under an Emergency Use Authorization (EUA). This EUA will remain in effect (meaning this test can be used) for the duration of the COVID-19 declaration under Section 564(b)(1) of the Act, 21 U.S.C. section 360bbb-3(b)(1), unless the authorization is terminated or revoked.  Performed at [facility], [address], [place], [place] [address]      Radiology Studies: CT Angio Chest PE W/Cm &/Or Wo Cm Result Date: 11/30/2024 EXAM: CTA of the Chest with contrast for PE 11/30/2024 02:02:10 AM TECHNIQUE: CTA of the chest was performed after the administration of 75 mL iohexol  (OMNIPAQUE ) 350 MG/ML injection. Multiplanar reformatted images are provided for review. MIP images are provided for review. Automated exposure control, iterative reconstruction, and/or weight based adjustment of the mA/kV was utilized to reduce the radiation dose to as low as reasonably achievable. COMPARISON: 02/04/2017, chest x-ray from the previous day. CLINICAL HISTORY: Shortness of breath FINDINGS: PULMONARY ARTERIES: The pulmonary artery shows a normal branching pattern bilaterally. No filling defect is noted to suggest pulmonary embolism. Main pulmonary artery is normal in caliber. MEDIASTINUM: The heart is at the upper limits of normal in size. Mild coronary calcifications are seen. Atherosclerotic calcifications of the aorta are seen without aneurysmal dilatation. The degree of opacification is limited with regards to dissection evaluation. The esophagus is within normal limits. The thoracic inlet is within normal limits. LYMPH NODES: No sizable hilar or  mediastinal adenopathy is noted. No axillary lymphadenopathy. LUNGS AND PLEURA: The lungs are well aerated bilaterally. Mild increased airspace opacity is noted, left slightly greater than right, similar to that seen on the prior exam, most consistent with edema. No focal effusion is seen. No pneumothorax. UPPER ABDOMEN: Upper abdomen shows no acute abnormality. SOFT TISSUES AND BONES: No acute soft tissue abnormality. No bony abnormality is noted. IMPRESSION: 1. No pulmonary embolism. 2. Mild bilateral airspace opacity, left greater than right, most consistent with pulmonary edema, unchanged from prior. 3. No pleural effusion. Electronically signed by: Oneil Devonshire MD 11/30/2024 02:13 AM EST RP Workstation: [id number]  DG Chest 2 View Result Date: 11/29/2024 EXAM: 2 VIEW(S) XRAY OF THE CHEST 11/29/2024 11:35:00 PM COMPARISON: 06/28/2024 CLINICAL HISTORY: SOB FINDINGS: LUNGS AND PLEURA: Trace bilateral pleural effusions. Perihilar vascular fullness and prominent interstitial markings. Faint left upper and lower lobe perihilar airspace opacity. No pneumothorax. HEART AND MEDIASTINUM: Cardiomegaly. BONES AND SOFT TISSUES: No acute osseous abnormality. IMPRESSION: 1. Faint left upper and lower lobe perihilar airspace opacity. 2. Trace bilateral pleural effusions. 3. Cardiomegaly. Electronically signed by: Greig Pique MD 11/29/2024 11:43 PM EST RP Workstation: [id number]    Scheduled Meds:  amLODipine   10 mg Oral Daily   aspirin  EC  81 mg Oral Daily   atorvastatin   80 mg Oral Daily   enoxaparin  (LOVENOX ) injection  40 mg Subcutaneous Daily   furosemide   40 mg Intravenous BID   gabapentin   100 mg Oral Q8H   losartan   100 mg Oral Daily   melatonin  5 mg Oral QHS   sertraline   50 mg Oral Daily   sodium chloride  flush  3 mL Intravenous Q12H   spironolactone   25 mg Oral Daily   timolol   1 drop Both Eyes QPC breakfast   Continuous Infusions:   LOS: 1 day   Fredia Skeeter, MD [facility]  12/01/2024, 10:55 AM   *Please note that this is a verbal dictation therefore any spelling or grammatical errors are due to the Dragon Medical One system interpretation.  Please page via Amion and do not message via secure chat for urgent patient care matters. Secure chat can be used for non urgent patient care matters.  How to contact the [facility] Attending or Consulting provider 7A - 7P or covering provider during after hours 7P -7A, for this patient?  Check the care team in [facility] and look for a) attending/consulting [facility] provider listed and b) the [facility] team listed. Page or secure chat 7A-7P. Log into [email or web address] and use [facility] universal password to access. If you do not have the password, please contact the hospital operator. Locate the [facility] provider you are looking for under [facility] and page to a number that you can be directly reached. If you still have difficulty reaching the provider, please page the [facility] (Director on Call) for the Hospitalists listed on amion for assistance.

## 2024-12-01 NOTE — Plan of Care (Signed)

## 2024-12-02 ENCOUNTER — Inpatient Hospital Stay (HOSPITAL_COMMUNITY)

## 2024-12-02 DIAGNOSIS — I5023 Acute on chronic systolic (congestive) heart failure: Secondary | ICD-10-CM | POA: Diagnosis not present

## 2024-12-02 MED ORDER — TORSEMIDE 20 MG PO TABS
20.0000 mg | ORAL_TABLET | Freq: Every day | ORAL | Status: DC
  Administered 2024-12-02 – 2024-12-03 (×2): 20 mg via ORAL
  Filled 2024-12-02 (×2): qty 1

## 2024-12-02 MED ORDER — ENSURE PLUS HIGH PROTEIN PO LIQD
237.0000 mL | Freq: Two times a day (BID) | ORAL | Status: DC
  Administered 2024-12-02 – 2024-12-03 (×3): 237 mL via ORAL

## 2024-12-02 MED ORDER — DICLOFENAC SODIUM 1 % EX GEL
2.0000 g | Freq: Four times a day (QID) | CUTANEOUS | Status: DC
  Administered 2024-12-02 (×2): 2 g via TOPICAL
  Filled 2024-12-02: qty 100

## 2024-12-02 MED ADMIN — Losartan Potassium Tab 25 MG: 25 mg | ORAL | @ 09:00:00 | NDC 68180037609

## 2024-12-02 MED FILL — Losartan Potassium Tab 25 MG: 25.0000 mg | ORAL | Qty: 1 | Status: AC

## 2024-12-02 NOTE — Plan of Care (Signed)

## 2024-12-02 NOTE — Evaluation (Signed)
 Physical Therapy Evaluation Patient Details Name: Matthew [name]. MRN: [id number] DOB: 1966/03/31 Today's Date: 12/02/2024  History of Present Illness  58 y.o. male presents to [facility] 11/29/24 with SOB and ankle swelling. Admitted with acute exacerbation of CHF. CTA chest showed mild bilateral airspace opacities suggestive of edema. PMHx: hypertension, type 2 diabetes mellitus, history of CVA, CKD stage II, cocaine abuse, OSA, and cardiomyopathy with EF less than 20%   Clinical Impression  PTA pt was independent for mobility with no AD. Pt presents with decreased activity tolerance, fatigue, and impaired balance. Pt was ModI for bed mobility and ModI to stand with no AD. Pt was slightly unsteady when ambulating with no AD and would reach for UE support if available. Distance limited by fatigue with pt reporting needing to rest. Discussed trialing rollator as pt has had difficulty ambulating long distances. Pt does not have any available assist available upon d/c home. Pt will need to be ModI for all mobility to d/c home safely. Recommending HHPT to start with transition to OP PT. Acute PT to follow.   98% SpO2 on RA 70 BPM        If plan is discharge home, recommend the following: Assist for transportation;Help with stairs or ramp for entrance   Can travel by private vehicle    Yes    Equipment Recommendations Rollator (4 wheels)     Functional Status Assessment Patient has had a recent decline in their functional status and demonstrates the ability to make significant improvements in function in a reasonable and predictable amount of time.     Precautions / Restrictions Precautions Precautions: Fall Recall of Precautions/Restrictions: Intact Restrictions Weight Bearing Restrictions Per Provider Order: No      Mobility  Bed Mobility Overal bed mobility: Modified Independent     Transfers Overall transfer level: Modified independent Equipment used: None    General  transfer comment: ModI with no AD    Ambulation/Gait Ambulation/Gait assistance: Supervision Gait Distance (Feet): 15 Feet (x15, x30) Assistive device: None Gait Pattern/deviations: Step-through pattern, Decreased stride length Gait velocity: decr     General Gait Details: Slightly unsteady with wide BOS and increased medial/lateral sway. Would reach for UE support if available    Balance Overall balance assessment: Needs assistance, Mild deficits observed, not formally tested Sitting-balance support: No upper extremity supported, Feet supported Sitting balance-Leahy Scale: Good     Standing balance support: No upper extremity supported Standing balance-Leahy Scale: Fair       Pertinent Vitals/Pain Pain Assessment Pain Assessment: Faces Faces Pain Scale: Hurts little more Pain Location: low back Pain Descriptors / Indicators: Discomfort Pain Intervention(s): Limited activity within patient's tolerance, Monitored during session, Repositioned    Home Living Family/patient expects to be discharged to:: Private residence Living Arrangements: Alone Available Help at Discharge:  (no assist) Type of Home: Apartment Home Access: Level entry    Home Layout: One level Home Equipment: None      Prior Function Prior Level of Function : Independent/Modified Independent    Mobility Comments: Ind with no AD ADLs Comments: Ind     Extremity/Trunk Assessment   Upper Extremity Assessment Upper Extremity Assessment: Defer to OT evaluation    Lower Extremity Assessment Lower Extremity Assessment: Overall WFL for tasks assessed    Cervical / Trunk Assessment Cervical / Trunk Assessment: Normal  Communication   Communication Communication: No apparent difficulties    Cognition Arousal: Alert Behavior During Therapy: WFL for tasks assessed/performed   PT - Cognitive  impairments: No apparent impairments    Following commands: Intact       Cueing Cueing Techniques:  Verbal cues      PT Assessment Patient needs continued PT services  PT Problem List Decreased activity tolerance;Decreased balance;Decreased mobility;Cardiopulmonary status limiting activity       PT Treatment Interventions DME instruction;Gait training;Functional mobility training;Therapeutic activities;Neuromuscular re-education;Balance training;Therapeutic exercise;Patient/family education    PT Goals (Current goals can be found in the Care Plan section)  Acute Rehab PT Goals Patient Stated Goal: to feel better PT Goal Formulation: With patient Time For Goal Achievement: 12/16/24 Potential to Achieve Goals: Good    Frequency Min 2X/week        AM-PAC PT 6 Clicks Mobility  Outcome Measure Help needed turning from your back to your side while in a flat bed without using bedrails?: None Help needed moving from lying on your back to sitting on the side of a flat bed without using bedrails?: None Help needed moving to and from a bed to a chair (including a wheelchair)?: None Help needed standing up from a chair using your arms (e.g., wheelchair or bedside chair)?: None Help needed to walk in hospital room?: A Little Help needed climbing 3-5 steps with a railing? : A Little 6 Click Score: 22    End of Session   Activity Tolerance: Patient limited by fatigue Patient left: in bed;with call bell/phone within reach;with bed alarm set Nurse Communication: Mobility status PT Visit Diagnosis: Unsteadiness on feet (R26.81);Other abnormalities of gait and mobility (R26.89)    Time: 8944-8888 PT Time Calculation (min) (ACUTE ONLY): 16 min   Charges:   PT Evaluation $PT Eval Low Complexity: 1 Low   PT General Charges $$ ACUTE PT VISIT: 1 Visit        Kate ORN, PT, DPT Secure Chat Preferred  Rehab Office [phone]   Kate BRAVO Wendolyn 12/02/2024, 11:17 AM

## 2024-12-02 NOTE — Evaluation (Addendum)
 Occupational Therapy Evaluation Patient Details Name: Matthew [name]. MRN: [id number] DOB: 04-08-1966 Today's Date: 12/02/2024   History of Present Illness   58 y.o. male presents to [facility] 11/29/24 with SOB and ankle swelling. Admitted with acute exacerbation of CHF. CTA chest showed mild bilateral airspace opacities suggestive of edema. PMHx: hypertension, type 2 diabetes mellitus, history of CVA, CKD stage II, cocaine abuse, OSA, and cardiomyopathy with EF less than 20%     Clinical Impressions Pt reports living alone, is ind at baseline with ADLs and mobility. Pt currently needs up to min A for ADLs, mod I for bed mobility and mod I for transfers without AD. Pt reports dizziness with ambulating/standing and reports prior falls at home, BP with slight drop (see below). Pt presenting with impairments listed below, will follow acutely. Anticipate no OT follow up needs at d/c.     If plan is discharge home, recommend the following:   A little help with walking and/or transfers;A little help with bathing/dressing/bathroom;Assistance with cooking/housework;Direct supervision/assist for financial management;Direct supervision/assist for medications management;Assist for transportation;Help with stairs or ramp for entrance     Functional Status Assessment   Patient has had a recent decline in their functional status and demonstrates the ability to make significant improvements in function in a reasonable and predictable amount of time.     Equipment Recommendations   Tub/shower seat     Recommendations for Other Services   PT consult     Precautions/Restrictions   Restrictions Weight Bearing Restrictions Per Provider Order: No     Mobility Bed Mobility Overal bed mobility: Modified Independent             General bed mobility comments: use of bed rails    Transfers Overall transfer level: Modified independent Equipment used: None               General  transfer comment: reaches out x1 for external support with ambulation to bathroom      Balance Overall balance assessment: Needs assistance, Mild deficits observed, not formally tested Sitting-balance support: No upper extremity supported, Feet supported Sitting balance-Leahy Scale: Good     Standing balance support: No upper extremity supported Standing balance-Leahy Scale: Fair                             ADL either performed or assessed with clinical judgement   ADL Overall ADL's : Needs assistance/impaired Eating/Feeding: Set up   Grooming: Set up   Upper Body Bathing: Minimal assistance   Lower Body Bathing: Minimal assistance   Upper Body Dressing : Minimal assistance   Lower Body Dressing: Minimal assistance   Toilet Transfer: Contact guard assist;Ambulation   Toileting- Clothing Manipulation and Hygiene: Contact guard assist       Functional mobility during ADLs: Contact guard assist       Vision   Additional Comments: pt reports hx of blindness in R eye x1 year, does not specify if he is able to see shadows, states  i am blind in my right eye     Perception Perception: Not tested       Praxis Praxis: Not tested       Pertinent Vitals/Pain Pain Assessment Pain Assessment: Faces Pain Score: 4  Faces Pain Scale: Hurts little more Pain Location: low back Pain Descriptors / Indicators: Discomfort Pain Intervention(s): Limited activity within patient's tolerance, Monitored during session, Repositioned     Extremity/Trunk Assessment Upper  Extremity Assessment Upper Extremity Assessment: Generalized weakness   Lower Extremity Assessment Lower Extremity Assessment: Defer to PT evaluation   Cervical / Trunk Assessment Cervical / Trunk Assessment: Normal   Communication Communication Communication: No apparent difficulties   Cognition Arousal: Alert Behavior During Therapy: WFL for tasks assessed/performed                                  Following commands: Intact       Cueing  General Comments   Cueing Techniques: Verbal cues  pt reports dizziness, mild BP drop with standing, see note   Exercises     Shoulder Instructions      Home Living Family/patient expects to be discharged to:: Private residence Living Arrangements: Alone Available Help at Discharge:  (no assist) Type of Home: Apartment Home Access: Level entry     Home Layout: One level     Bathroom Shower/Tub: Chief Strategy Officer: Standard Bathroom Accessibility: Yes   Home Equipment: None          Prior Functioning/Environment Prior Level of Function : Independent/Modified Independent             Mobility Comments: no AD ADLs Comments: ind, friends/family take pt    OT Problem List: Decreased strength;Decreased range of motion;Decreased activity tolerance;Impaired balance (sitting and/or standing);Decreased cognition;Decreased safety awareness   OT Treatment/Interventions: Self-care/ADL training;Therapeutic exercise;Energy conservation;DME and/or AE instruction;Therapeutic activities;Patient/family education;Cognitive remediation/compensation;Balance training;Visual/perceptual remediation/compensation      OT Goals(Current goals can be found in the care plan section)   Acute Rehab OT Goals Patient Stated Goal: to lay down OT Goal Formulation: With patient Time For Goal Achievement: 12/16/24 Potential to Achieve Goals: Good ADL Goals Pt Will Perform Lower Body Dressing: Independently;sitting/lateral leans;sit to/from stand Pt Will Perform Tub/Shower Transfer: Tub transfer;Shower transfer;Independently;ambulating Additional ADL Goal #1: pt will verbalize x3 CHF/energy conservation strategies in prep for ADLs   OT Frequency:  Min 2X/week    Co-evaluation              AM-PAC OT 6 Clicks Daily Activity     Outcome Measure Help from another person eating meals?: None Help from  another person taking care of personal grooming?: A Little Help from another person toileting, which includes using toliet, bedpan, or urinal?: A Little Help from another person bathing (including washing, rinsing, drying)?: A Little Help from another person to put on and taking off regular upper body clothing?: A Little Help from another person to put on and taking off regular lower body clothing?: A Little 6 Click Score: 19   End of Session Nurse Communication: Mobility status; ok to have graham crackers, ginger ale, and peanut butter  Activity Tolerance: Patient tolerated treatment well Patient left: in bed;with call bell/phone within reach  OT Visit Diagnosis: Unsteadiness on feet (R26.81);Other abnormalities of gait and mobility (R26.89);Muscle weakness (generalized) (M62.81);History of falling (Z91.81)                Time: 8667-8651 OT Time Calculation (min): 16 min Charges:  OT General Charges $OT Visit: 1 Visit OT Evaluation $OT Eval Low Complexity: 1 Low  Lashaunta Sicard K, OTD, OTR/L SecureChat Preferred Acute Rehab [phone]   Marchella Hibbard K Koonce 12/02/2024, 1:56 PM

## 2024-12-02 NOTE — Progress Notes (Signed)
 PROGRESS NOTE    Matthew [name].  FMW:[id number] DOB: 09/12/1966 DOA: 11/29/2024 PCP: Matthew Clam, MD  58/M w hypertension, type 2 diabetes mellitus, history of CVA, CKD stage II, cocaine abuse, OSA, systolic CHF w/ EF<20% who presented with shortness of breath and ankle swelling for about 1 week but no coughing.  No orthopnea.  He last used crack cocaine on 11/27/2024.  He has been out of his medications for at least a week. -In the ED, volume overloaded, hypertensive, CTA chest  negative for PE but notable for mild bilateral airspace opacities most suggestive of edema.    Subjective: -Complains of low back pain, limited mobility - Breathing is improving  Assessment and Plan:  Acute on chronic systolic CHF - Poor compliance with medication, follow-up, diet, and cocaine abuse -Last echo 7/25 noted EF less than 20%, severely dilated LV, grade 2 DD, normal RV -History of intolerances to Entresto  and Farxiga  per cards notes -Volume status is improving, switch to oral torsemide  today, continue losartan , restart Aldactone  - Will need increased assistance in the community for compliance  Hypertensive urgency - Secondary to noncompliance and increased volume, now improved - Meds as above  Severe low back pain - Add Voltaren  gel, check x-rays, PT  History of CAD Stable, continue aspirin  and statin  CKD 3 A - Baseline creatinine 1.4-1.6, stable at baseline, avoid hypotension  Anxiety Continue Zoloft   OSA Continue CPAP  Hypomagnesemia Repleted   DVT prophylaxis: Lovenox  Code Status: Full code Family Communication: None present Disposition Plan: Home tomorrow if stable, lives with a roommate  Consultants:    Procedures:   Antimicrobials:    Objective: Vitals:   12/02/24 0016 12/02/24 0040 12/02/24 0412 12/02/24 0726  BP: (!) 126/90  (!) 137/92 (!) 126/90  Pulse: 67  67 66  Resp: (!) 29 (!) 21 (!) 22 (!) 24  Temp: (!) 97.5 F (36.4 C)  97.8 F (36.6 C)  97.7 F (36.5 C)  TempSrc: Oral  Oral Oral  SpO2: 100%  100% 97%  Weight:   63 kg   Height:        Intake/Output Summary (Last 24 hours) at 12/02/2024 1106 Last data filed at 12/02/2024 0927 Gross per 24 hour  Intake 240 ml  Output 1150 ml  Net -910 ml   Filed Weights   11/29/24 2300 12/01/24 1333 12/02/24 0412  Weight: 65.1 kg 63.6 kg 63 kg    Examination:  General exam: Appears calm and comfortable  Respiratory system: Clear to auscultation Cardiovascular system: S1 & S2 heard, RRR.  Abd: nondistended, soft and nontender.Normal bowel sounds heard. Central nervous system: Alert and oriented. No focal neurological deficits. Extremities: no edema Skin: No rashes Psychiatry:  Mood & affect appropriate.     Data Reviewed:   CBC: Recent Labs  Lab 11/29/24 2357 11/30/24 0614 12/01/24 0937  WBC 5.0 5.5 3.5*  NEUTROABS  --   --  1.3*  HGB 13.6 13.8 15.3  HCT 41.1 41.2 44.2  MCV 92.8 90.4 88.6  PLT 212 225 259   Basic Metabolic Panel: Recent Labs  Lab 11/29/24 2357 11/30/24 0614 11/30/24 2020 12/01/24 0937  NA 142 141 139 139  K 4.1 3.7 3.7 4.4  CL 107 103 101 99  CO2 27 25 26 28   GLUCOSE 120* 183* 170* 122*  BUN 17 18 26* 35*  CREATININE 1.31* 1.41* 1.68* 1.48*  CALCIUM  9.4 9.4 9.1 9.6  MG  --  1.7 1.5* 1.8   GFR: Estimated  Creatinine Clearance: 49.1 mL/min (A) (by C-G formula based on SCr of 1.48 mg/dL (H)). Liver Function Tests: Recent Labs  Lab 11/30/24 0212  AST 20  ALT 24  ALKPHOS 62  BILITOT 0.4  PROT 3.8*  ALBUMIN 1.9*   No results for input(s): LIPASE, AMYLASE in the last 168 hours. No results for input(s): AMMONIA in the last 168 hours. Coagulation Profile: No results for input(s): INR, PROTIME in the last 168 hours. Cardiac Enzymes: No results for input(s): CKTOTAL, CKMB, CKMBINDEX, TROPONINI in the last 168 hours. BNP (last 3 results) No results for input(s): PROBNP in the last 8760 hours. HbA1C: No results  for input(s): HGBA1C in the last 72 hours. CBG: No results for input(s): GLUCAP in the last 168 hours. Lipid Profile: No results for input(s): CHOL, HDL, LDLCALC, TRIG, CHOLHDL, LDLDIRECT in the last 72 hours. Thyroid  Function Tests: No results for input(s): TSH, T4TOTAL, FREET4, T3FREE, THYROIDAB in the last 72 hours. Anemia Panel: No results for input(s): VITAMINB12, FOLATE, FERRITIN, TIBC, IRON, RETICCTPCT in the last 72 hours. Urine analysis:    Component Value Date/Time   COLORURINE STRAW (A) 11/30/2024 0144   APPEARANCEUR CLEAR 11/30/2024 0144   LABSPEC 1.009 11/30/2024 0144   PHURINE 8.0 11/30/2024 0144   GLUCOSEU NEGATIVE 11/30/2024 0144   HGBUR NEGATIVE 11/30/2024 0144   BILIRUBINUR NEGATIVE 11/30/2024 0144   KETONESUR NEGATIVE 11/30/2024 0144   PROTEINUR 30 (A) 11/30/2024 0144   NITRITE NEGATIVE 11/30/2024 0144   LEUKOCYTESUR NEGATIVE 11/30/2024 0144   Sepsis Labs: @LABRCNTIP (procalcitonin:4,lacticidven:4)  ) Recent Results (from the past 240 hours)  Resp panel by RT-PCR (RSV, Flu A&B, Covid) Anterior Nasal Swab     Status: None   Collection Time: 11/29/24 11:40 PM   Specimen: Anterior Nasal Swab  Result Value Ref Range Status   SARS Coronavirus 2 by RT PCR NEGATIVE NEGATIVE Final   Influenza A by PCR NEGATIVE NEGATIVE Final   Influenza B by PCR NEGATIVE NEGATIVE Final    Comment: (NOTE) The Xpert Xpress SARS-CoV-2/FLU/RSV plus assay is intended as an aid in the diagnosis of influenza from Nasopharyngeal swab specimens and should not be used as a sole basis for treatment. Nasal washings and aspirates are unacceptable for Xpert Xpress SARS-CoV-2/FLU/RSV testing.  Fact Sheet for Patients: bloggercourse.com  Fact Sheet for Healthcare Providers: seriousbroker.it  This test is not yet approved or cleared by the [place]  FDA and has been authorized for detection and/or  diagnosis of SARS-CoV-2 by FDA under an Emergency Use Authorization (EUA). This EUA will remain in effect (meaning this test can be used) for the duration of the COVID-19 declaration under Section 564(b)(1) of the Act, 21 U.S.C. section 360bbb-3(b)(1), unless the authorization is terminated or revoked.     Resp Syncytial Virus by PCR NEGATIVE NEGATIVE Final    Comment: (NOTE) Fact Sheet for Patients: bloggercourse.com  Fact Sheet for Healthcare Providers: seriousbroker.it  This test is not yet approved or cleared by the [place]  FDA and has been authorized for detection and/or diagnosis of SARS-CoV-2 by FDA under an Emergency Use Authorization (EUA). This EUA will remain in effect (meaning this test can be used) for the duration of the COVID-19 declaration under Section 564(b)(1) of the Act, 21 U.S.C. section 360bbb-3(b)(1), unless the authorization is terminated or revoked.  Performed at [facility], [address] [place], [place] [address]      Radiology Studies: No results found.   Scheduled Meds:  amLODipine   10 mg Oral Daily  aspirin  EC  81 mg Oral Daily   atorvastatin   80 mg Oral Daily   diclofenac  Sodium  2 g Topical QID   enoxaparin  (LOVENOX ) injection  40 mg Subcutaneous Daily   feeding supplement  237 mL Oral BID BM   gabapentin   100 mg Oral Q8H   losartan   25 mg Oral Daily   melatonin  5 mg Oral QHS   sertraline   50 mg Oral Daily   sodium chloride  flush  3 mL Intravenous Q12H   spironolactone   25 mg Oral Daily   timolol   1 drop Both Eyes QPC breakfast   torsemide   20 mg Oral Daily   Continuous Infusions:   LOS: 2 days    Time spent:    Sigurd Pac, MD [facility]   12/02/2024, 11:06 AM

## 2024-12-03 ENCOUNTER — Other Ambulatory Visit (HOSPITAL_COMMUNITY): Payer: Self-pay

## 2024-12-03 DIAGNOSIS — I5023 Acute on chronic systolic (congestive) heart failure: Secondary | ICD-10-CM | POA: Diagnosis not present

## 2024-12-03 MED ORDER — AMLODIPINE BESYLATE 10 MG PO TABS
10.0000 mg | ORAL_TABLET | Freq: Every day | ORAL | 1 refills | Status: DC
  Filled 2024-12-03: qty 30, 30d supply, fill #0

## 2024-12-03 MED ORDER — SERTRALINE HCL 100 MG PO TABS
150.0000 mg | ORAL_TABLET | Freq: Every day | ORAL | 0 refills | Status: AC
  Filled 2024-12-03: qty 45, 30d supply, fill #0

## 2024-12-03 MED ORDER — TORSEMIDE 20 MG PO TABS
20.0000 mg | ORAL_TABLET | Freq: Every day | ORAL | 1 refills | Status: DC
  Filled 2024-12-03: qty 30, 30d supply, fill #0

## 2024-12-03 MED ORDER — GABAPENTIN 100 MG PO CAPS
100.0000 mg | ORAL_CAPSULE | Freq: Three times a day (TID) | ORAL | 1 refills | Status: AC
  Filled 2024-12-03: qty 90, 30d supply, fill #0

## 2024-12-03 MED ORDER — SPIRONOLACTONE 25 MG PO TABS
25.0000 mg | ORAL_TABLET | Freq: Every day | ORAL | 1 refills | Status: DC
  Filled 2024-12-03: qty 30, 30d supply, fill #0

## 2024-12-03 MED ORDER — TRAMADOL HCL 50 MG PO TABS
50.0000 mg | ORAL_TABLET | Freq: Three times a day (TID) | ORAL | 0 refills | Status: AC | PRN
  Filled 2024-12-03: qty 30, 7d supply, fill #0

## 2024-12-03 MED ORDER — ATORVASTATIN CALCIUM 80 MG PO TABS
80.0000 mg | ORAL_TABLET | Freq: Every day | ORAL | 1 refills | Status: DC
  Filled 2024-12-03: qty 30, 30d supply, fill #0

## 2024-12-03 MED ORDER — LOSARTAN POTASSIUM 25 MG PO TABS
25.0000 mg | ORAL_TABLET | Freq: Every day | ORAL | 1 refills | Status: DC
  Filled 2024-12-03: qty 30, 30d supply, fill #0

## 2024-12-03 MED ADMIN — Losartan Potassium Tab 25 MG: 25 mg | ORAL | @ 09:00:00 | NDC 68180037609

## 2024-12-03 MED FILL — Diclofenac Sodium Gel 1% (1.16% Diethylamine Equiv): 2.0000 g | CUTANEOUS | 13 days supply | Qty: 100 | Fill #0 | Status: AC

## 2024-12-03 NOTE — TOC Progression Note (Signed)
 Transition of Care (TOC) - Progression Note    Patient Details  Name: Matthew [name]. MRN: [id number] Date of Birth: 03-19-1966  Transition of Care [facility]) CM/SW Contact  Luise JAYSON Pan, [place] Phone Number: 12/03/2024, 10:55 AM  Clinical Narrative:   CSW followed up with patient and provided substance use resources.   CSW will continue to follow.    Expected Discharge Plan: Home/Self Care Barriers to Discharge: No Barriers Identified               Expected Discharge Plan and Services In-house Referral: NA Discharge Planning Services: CM Consult Post Acute Care Choice: NA Living arrangements for the past 2 months: Single Family Home Expected Discharge Date: 12/03/24               DME Arranged: Vannie rolling with seat DME Agency: [facility] Date DME Agency Contacted: 12/03/24 Time DME Agency Contacted: [phone] Representative spoke with at DME Agency: Ryan CHEADLE Arranged: NA           Social Drivers of Health (SDOH) Interventions SDOH Screenings   Food Insecurity: Food Insecurity Present (12/01/2024)  Housing: High Risk (12/01/2024)  Transportation Needs: Unmet Transportation Needs (12/01/2024)  Utilities: At Risk (12/01/2024)  Alcohol Screen: Low Risk (01/14/2024)  Depression (PHQ2-9): High Risk (11/04/2023)  Social Connections: Unknown (05/01/2022)   Received from [facility]  Tobacco Use: High Risk (11/30/2024)    Readmission Risk Interventions    12/03/2024    8:52 AM 06/30/2024    1:02 PM  Readmission Risk Prevention Plan  Transportation Screening Complete Complete  PCP or Specialist Appt within 5-7 Days  Complete  PCP or Specialist Appt within 3-5 Days Complete   Home Care Screening  Complete  Medication Review (RN CM)  Complete  HRI or Home Care Consult Complete   Palliative Care Screening Not Applicable   Medication Review (RN Care Manager) Complete

## 2024-12-03 NOTE — Discharge Summary (Signed)
 Physician Discharge Summary  Matthew [name]. FMW:[id number] DOB: [birth year] DOA: 11/29/2024  PCP: [name], MD  Admit date: 11/29/2024 Discharge date: 12/03/2024  Time spent: 45 minutes  Recommendations for Outpatient Follow-up:  Advanced heart failure clinic on 12/23 at 10 AM  Discharge Diagnoses:  Principal Problem:   Acute on chronic HFrEF (heart failure with reduced ejection fraction) (HCC) Active Problems:   CKD (chronic kidney disease) stage 2, GFR 60-89 ml/min   History of stroke   Hypertensive urgency   GAD (generalized anxiety disorder)   Cocaine abuse (HCC) Low back pain  Discharge Condition: Improved  Diet recommendation: Heart healthy  Filed Weights   12/01/24 1333 12/02/24 0412 12/03/24 0431  Weight: 63.6 kg 63 kg 63 kg    History of present illness:  58/M w hypertension, type 2 diabetes mellitus, history of CVA, CKD stage II, cocaine abuse, OSA, systolic CHF w/ EF<20% who presented with shortness of breath and ankle swelling for about 1 week but no coughing.  No orthopnea.  He last used crack cocaine on 11/27/2024.  He has been out of his medications for at least a week. -In the ED, volume overloaded, hypertensive, CTA chest  negative for PE but notable for mild bilateral airspace opacities most suggestive of edema.  Hospital Course:   Acute on chronic systolic CHF - Poor compliance with medication, follow-up, diet, and cocaine abuse -Last echo 7/25 noted EF less than 20%, severely dilated LV, grade 2 DD, normal RV -History of intolerances to Entresto  and Farxiga  per cards notes -Volume status is improving, 6L negative, switched to oral torsemide , continue losartan , Aldactone  -All cardiac meds refilled at [facility] today - Will need increased assistance in the community for compliance -Follow-up made in advanced heart failure clinic for 12/23   Hypertensive urgency - Secondary to noncompliance and increased volume, now improved - Meds as above    Low back pain - x-rays with mild compression of superior endplate of L1, history of prior fall -Improving, continue Voltaren  gel tramadol  and home PT   History of CAD Stable, continue aspirin  and statin   CKD 3 A - Baseline creatinine 1.4-1.6, stable at baseline, avoid hypotension   Anxiety Continue Zoloft    OSA Continue CPAP   Hypomagnesemia Repleted  Discharge Exam: Vitals:   12/03/24 0431 12/03/24 0725  BP: (!) 154/94 131/83  Pulse: 63 62  Resp: 17 19  Temp: 97.7 F (36.5 C) 97.6 F (36.4 C)  SpO2: 100% 98%   Gen: Awake, Alert, Oriented X 3,  HEENT: no JVD Lungs: Good air movement bilaterally, CTAB CVS: S1S2/RRR Abd: soft, Non tender, non distended, BS present Extremities: No edema Skin: no new rashes on exposed skin   Discharge Instructions   Discharge Instructions     Ambulatory referral to Physical Therapy   Complete by: As directed    Will need OP PT , please call patient to set up apt times. thanks   Diet - low sodium heart healthy   Complete by: As directed    Increase activity slowly   Complete by: As directed       Allergies as of 12/03/2024   No Known Allergies      Medication List     STOP taking these medications    ezetimibe  10 MG tablet Commonly known as: ZETIA    furosemide  40 MG tablet Commonly known as: Lasix        TAKE these medications    amLODipine  10 MG tablet Commonly known as: NORVASC  Take  1 tablet (10 mg total) by mouth daily. Skip the dose if systolic BP less than 130 mmHg   atorvastatin  80 MG tablet Commonly known as: LIPITOR  Take 1 tablet (80 mg total) by mouth daily.   diclofenac  Sodium 1 % Gel Commonly known as: VOLTAREN  Apply 2 g topically 4 (four) times daily.   gabapentin  100 MG capsule Commonly known as: NEURONTIN  Take 1 capsule (100 mg total) by mouth every 8 (eight) hours.   hydrOXYzine  10 MG tablet Commonly known as: ATARAX  Take 1 tablet (10 mg total) by mouth 3 (three) times daily as  needed for itching or anxiety.   losartan  25 MG tablet Commonly known as: COZAAR  Take 1 tablet (25 mg total) by mouth daily. What changed:  medication strength how much to take   melatonin 5 MG Tabs Take 1 tablet (5 mg total) by mouth at bedtime.   sertraline  100 MG tablet Commonly known as: ZOLOFT  Take 1.5 tablets (150 mg total) by mouth daily.   spironolactone  25 MG tablet Commonly known as: ALDACTONE  Take 1 tablet (25 mg total) by mouth daily.   timolol  0.5 % ophthalmic solution Commonly known as: TIMOPTIC  Place 1 drop into both eyes daily after breakfast.   torsemide  20 MG tablet Commonly known as: DEMADEX  Take 1 tablet (20 mg total) by mouth daily.   traMADol  50 MG tablet Commonly known as: ULTRAM  Take 1 tablet (50 mg total) by mouth 3 (three) times daily as needed for moderate pain (pain score 4-6).               Durable Medical Equipment  (From admission, onward)           Start     Ordered   12/03/24 0859  For home use only DME 4 wheeled rolling walker with seat  Once       Question:  Patient needs a walker to treat with the following condition  Answer:  Weakness   12/03/24 0858           Allergies[1]  Follow-up Information     [name], MD Follow up on 12/04/2024.   Specialty: Family Medicine Why: 9 am for hospital follow up Contact information: [address] [place] [address] [place] [address] [phone]                  The results of significant diagnostics from this hospitalization (including imaging, microbiology, ancillary and laboratory) are listed below for reference.    Significant Diagnostic Studies: DG Lumbar Spine 2-3 Views Result Date: 12/02/2024 EXAM: 2 or 3 VIEW(S) XRAY OF THE LUMBAR SPINE 12/02/2024 10:51:00 AM COMPARISON: CT lumbar spine 10/26/2016. CLINICAL HISTORY: Low back pain. No injury. FINDINGS: LUMBAR SPINE: BONES: 5 lumbar type vertebral bodies with partial sacralization at L5. Normal  alignment. Mild compression of the superior endplate of L1 is new since the prior study, possibly acute. No retropulsion of fracture fragments. No focal bone lesion or bone destruction. Sacrum appears intact. DISCS AND DEGENERATIVE CHANGES: Diffuse degenerative changes with narrowed interspaces and endplate osteophyte formation. SOFT TISSUES: Vascular calcifications. No acute abnormality. IMPRESSION: 1. Mild compression of the superior endplate of L1, new since the prior study, possibly acute. No retropulsion of fracture fragments. 2. Diffuse degenerative changes with narrowed interspaces and endplate osteophyte formation. Electronically signed by: Elsie Gravely MD 12/02/2024 04:30 PM EST RP Workstation: [id number]   CT Angio Chest PE W/Cm &/Or Wo Cm Result Date: 11/30/2024 EXAM: CTA of the Chest with contrast for PE  11/30/2024 02:02:10 AM TECHNIQUE: CTA of the chest was performed after the administration of 75 mL iohexol  (OMNIPAQUE ) 350 MG/ML injection. Multiplanar reformatted images are provided for review. MIP images are provided for review. Automated exposure control, iterative reconstruction, and/or weight based adjustment of the mA/kV was utilized to reduce the radiation dose to as low as reasonably achievable. COMPARISON: 02/04/2017, chest x-ray from the previous day. CLINICAL HISTORY: Shortness of breath FINDINGS: PULMONARY ARTERIES: The pulmonary artery shows a normal branching pattern bilaterally. No filling defect is noted to suggest pulmonary embolism. Main pulmonary artery is normal in caliber. MEDIASTINUM: The heart is at the upper limits of normal in size. Mild coronary calcifications are seen. Atherosclerotic calcifications of the aorta are seen without aneurysmal dilatation. The degree of opacification is limited with regards to dissection evaluation. The esophagus is within normal limits. The thoracic inlet is within normal limits. LYMPH NODES: No sizable hilar or mediastinal adenopathy is  noted. No axillary lymphadenopathy. LUNGS AND PLEURA: The lungs are well aerated bilaterally. Mild increased airspace opacity is noted, left slightly greater than right, similar to that seen on the prior exam, most consistent with edema. No focal effusion is seen. No pneumothorax. UPPER ABDOMEN: Upper abdomen shows no acute abnormality. SOFT TISSUES AND BONES: No acute soft tissue abnormality. No bony abnormality is noted. IMPRESSION: 1. No pulmonary embolism. 2. Mild bilateral airspace opacity, left greater than right, most consistent with pulmonary edema, unchanged from prior. 3. No pleural effusion. Electronically signed by: Oneil Devonshire MD 11/30/2024 02:13 AM EST RP Workstation: [id number]   DG Chest 2 View Result Date: 11/29/2024 EXAM: 2 VIEW(S) XRAY OF THE CHEST 11/29/2024 11:35:00 PM COMPARISON: 06/28/2024 CLINICAL HISTORY: SOB FINDINGS: LUNGS AND PLEURA: Trace bilateral pleural effusions. Perihilar vascular fullness and prominent interstitial markings. Faint left upper and lower lobe perihilar airspace opacity. No pneumothorax. HEART AND MEDIASTINUM: Cardiomegaly. BONES AND SOFT TISSUES: No acute osseous abnormality. IMPRESSION: 1. Faint left upper and lower lobe perihilar airspace opacity. 2. Trace bilateral pleural effusions. 3. Cardiomegaly. Electronically signed by: Greig Pique MD 11/29/2024 11:43 PM EST RP Workstation: [id number]    Microbiology: Recent Results (from the past 240 hours)  Resp panel by RT-PCR (RSV, Flu A&B, Covid) Anterior Nasal Swab     Status: None   Collection Time: 11/29/24 11:40 PM   Specimen: Anterior Nasal Swab  Result Value Ref Range Status   SARS Coronavirus 2 by RT PCR NEGATIVE NEGATIVE Final   Influenza A by PCR NEGATIVE NEGATIVE Final   Influenza B by PCR NEGATIVE NEGATIVE Final    Comment: (NOTE) The Xpert Xpress SARS-CoV-2/FLU/RSV plus assay is intended as an aid in the diagnosis of influenza from Nasopharyngeal swab specimens and should not be used as a  sole basis for treatment. Nasal washings and aspirates are unacceptable for Xpert Xpress SARS-CoV-2/FLU/RSV testing.  Fact Sheet for Patients: bloggercourse.com  Fact Sheet for Healthcare Providers: seriousbroker.it  This test is not yet approved or cleared by the [place]  FDA and has been authorized for detection and/or diagnosis of SARS-CoV-2 by FDA under an Emergency Use Authorization (EUA). This EUA will remain in effect (meaning this test can be used) for the duration of the COVID-19 declaration under Section 564(b)(1) of the Act, 21 U.S.C. section 360bbb-3(b)(1), unless the authorization is terminated or revoked.     Resp Syncytial Virus by PCR NEGATIVE NEGATIVE Final    Comment: (NOTE) Fact Sheet for Patients: bloggercourse.com  Fact Sheet for Healthcare Providers: seriousbroker.it  This test is not yet approved  or cleared by the [place]  FDA and has been authorized for detection and/or diagnosis of SARS-CoV-2 by FDA under an Emergency Use Authorization (EUA). This EUA will remain in effect (meaning this test can be used) for the duration of the COVID-19 declaration under Section 564(b)(1) of the Act, 21 U.S.C. section 360bbb-3(b)(1), unless the authorization is terminated or revoked.  Performed at [facility], [address], [place], [place] [address]      Labs: Basic Metabolic Panel: Recent Labs  Lab 11/29/24 2357 11/30/24 0614 11/30/24 2020 12/01/24 0937  NA 142 141 139 139  K 4.1 3.7 3.7 4.4  CL 107 103 101 99  CO2 27 25 26 28   GLUCOSE 120* 183* 170* 122*  BUN 17 18 26* 35*  CREATININE 1.31* 1.41* 1.68* 1.48*  CALCIUM  9.4 9.4 9.1 9.6  MG  --  1.7 1.5* 1.8   Liver Function Tests: Recent Labs  Lab 11/30/24 0212  AST 20  ALT 24  ALKPHOS 62  BILITOT 0.4  PROT 3.8*  ALBUMIN 1.9*   No results for input(s): LIPASE, AMYLASE in  the last 168 hours. No results for input(s): AMMONIA in the last 168 hours. CBC: Recent Labs  Lab 11/29/24 2357 11/30/24 0614 12/01/24 0937  WBC 5.0 5.5 3.5*  NEUTROABS  --   --  1.3*  HGB 13.6 13.8 15.3  HCT 41.1 41.2 44.2  MCV 92.8 90.4 88.6  PLT 212 225 259   Cardiac Enzymes: No results for input(s): CKTOTAL, CKMB, CKMBINDEX, TROPONINI in the last 168 hours. BNP: BNP (last 3 results) Recent Labs    06/28/24 1901 06/29/24 0355 11/29/24 2357  BNP 627.8* 822.9* 1,160.2*    ProBNP (last 3 results) No results for input(s): PROBNP in the last 8760 hours.  CBG: No results for input(s): GLUCAP in the last 168 hours.     Signed:  Sigurd Pac MD.  [facility] 12/03/2024, 10:21 AM       [1] No Known Allergies

## 2024-12-03 NOTE — Plan of Care (Signed)
 Problem: Education: Goal: Ability to demonstrate management of disease process will improve 12/03/2024 0924 by Norine Colman BIRCH, RN Outcome: Adequate for Discharge 12/03/2024 0924 by Norine Colman BIRCH, RN Outcome: Adequate for Discharge 12/03/2024 0924 by Norine Colman BIRCH, RN Outcome: Progressing Goal: Ability to verbalize understanding of medication therapies will improve 12/03/2024 0924 by Norine Colman BIRCH, RN Outcome: Adequate for Discharge 12/03/2024 0924 by Norine Colman BIRCH, RN Outcome: Adequate for Discharge 12/03/2024 0924 by Norine Colman BIRCH, RN Outcome: Progressing Goal: Individualized Educational Video(s) 12/03/2024 0924 by Norine Colman BIRCH, RN Outcome: Adequate for Discharge 12/03/2024 0924 by Norine Colman BIRCH, RN Outcome: Adequate for Discharge 12/03/2024 0924 by Norine Colman BIRCH, RN Outcome: Progressing   Problem: Activity: Goal: Capacity to carry out activities will improve 12/03/2024 0924 by Norine Colman BIRCH, RN Outcome: Adequate for Discharge 12/03/2024 [phone] by Norine Colman BIRCH, RN Outcome: Adequate for Discharge 12/03/2024 [phone] by Norine Colman BIRCH, RN Outcome: Progressing   Problem: Cardiac: Goal: Ability to achieve and maintain adequate cardiopulmonary perfusion will improve 12/03/2024 0924 by Norine Colman BIRCH, RN Outcome: Adequate for Discharge 12/03/2024 0924 by Norine Colman BIRCH, RN Outcome: Adequate for Discharge 12/03/2024 0924 by Norine Colman BIRCH, RN Outcome: Progressing   Problem: Education: Goal: Knowledge of General Education information will improve Description: Including pain rating scale, medication(s)/side effects and non-pharmacologic comfort measures 12/03/2024 0924 by Norine Colman BIRCH, RN Outcome: Adequate for Discharge 12/03/2024 0924 by Norine Colman BIRCH, RN Outcome: Adequate for Discharge 12/03/2024 0924 by Norine Colman BIRCH, RN Outcome:  Progressing   Problem: Health Behavior/Discharge Planning: Goal: Ability to manage health-related needs will improve 12/03/2024 0924 by Norine Colman BIRCH, RN Outcome: Adequate for Discharge 12/03/2024 0924 by Norine Colman BIRCH, RN Outcome: Adequate for Discharge 12/03/2024 0924 by Norine Colman BIRCH, RN Outcome: Progressing   Problem: Clinical Measurements: Goal: Ability to maintain clinical measurements within normal limits will improve 12/03/2024 0924 by Norine Colman BIRCH, RN Outcome: Adequate for Discharge 12/03/2024 0924 by Norine Colman BIRCH, RN Outcome: Adequate for Discharge 12/03/2024 0924 by Norine Colman BIRCH, RN Outcome: Progressing Goal: Will remain free from infection 12/03/2024 0924 by Norine Colman BIRCH, RN Outcome: Adequate for Discharge 12/03/2024 0924 by Norine Colman BIRCH, RN Outcome: Adequate for Discharge 12/03/2024 0924 by Norine Colman BIRCH, RN Outcome: Progressing Goal: Diagnostic test results will improve 12/03/2024 0924 by Norine Colman BIRCH, RN Outcome: Adequate for Discharge 12/03/2024 0924 by Norine Colman BIRCH, RN Outcome: Adequate for Discharge 12/03/2024 0924 by Norine Colman BIRCH, RN Outcome: Progressing Goal: Respiratory complications will improve 12/03/2024 0924 by Norine Colman BIRCH, RN Outcome: Adequate for Discharge 12/03/2024 0924 by Norine Colman BIRCH, RN Outcome: Adequate for Discharge 12/03/2024 0924 by Norine Colman BIRCH, RN Outcome: Progressing Goal: Cardiovascular complication will be avoided 12/03/2024 0924 by Norine Colman BIRCH, RN Outcome: Adequate for Discharge 12/03/2024 0924 by Norine Colman BIRCH, RN Outcome: Adequate for Discharge 12/03/2024 0924 by Norine Colman BIRCH, RN Outcome: Progressing   Problem: Activity: Goal: Risk for activity intolerance will decrease 12/03/2024 0924 by Norine Colman BIRCH, RN Outcome: Adequate for Discharge 12/03/2024 [phone] by  Norine Colman BIRCH, RN Outcome: Adequate for Discharge 12/03/2024 [phone] by Norine Colman BIRCH, RN Outcome: Progressing   Problem: Nutrition: Goal: Adequate nutrition will be maintained 12/03/2024 0924 by Norine Colman BIRCH, RN Outcome: Adequate for Discharge 12/03/2024 0924 by Norine Colman BIRCH, RN Outcome: Adequate for Discharge 12/03/2024 0924 by Norine Colman BIRCH, RN Outcome: Progressing   Problem: Coping: Goal: Level of anxiety will decrease 12/03/2024 0924 by Norine,  Colman BIRCH, RN Outcome: Adequate for Discharge 12/03/2024 9075 by Norine Colman BIRCH, RN Outcome: Adequate for Discharge 12/03/2024 [phone] by Norine Colman BIRCH, RN Outcome: Progressing   Problem: Elimination: Goal: Will not experience complications related to bowel motility 12/03/2024 0924 by Norine Colman BIRCH, RN Outcome: Adequate for Discharge 12/03/2024 0924 by Norine Colman BIRCH, RN Outcome: Adequate for Discharge 12/03/2024 0924 by Norine Colman BIRCH, RN Outcome: Progressing Goal: Will not experience complications related to urinary retention 12/03/2024 0924 by Norine Colman BIRCH, RN Outcome: Adequate for Discharge 12/03/2024 0924 by Norine Colman BIRCH, RN Outcome: Adequate for Discharge 12/03/2024 0924 by Norine Colman BIRCH, RN Outcome: Progressing   Problem: Pain Managment: Goal: General experience of comfort will improve and/or be controlled 12/03/2024 0924 by Norine Colman BIRCH, RN Outcome: Adequate for Discharge 12/03/2024 0924 by Norine Colman BIRCH, RN Outcome: Adequate for Discharge 12/03/2024 0924 by Norine Colman BIRCH, RN Outcome: Progressing   Problem: Safety: Goal: Ability to remain free from injury will improve 12/03/2024 0924 by Norine Colman BIRCH, RN Outcome: Adequate for Discharge 12/03/2024 0924 by Norine Colman BIRCH, RN Outcome: Adequate for Discharge 12/03/2024 0924 by Norine Colman BIRCH, RN Outcome:  Progressing   Problem: Skin Integrity: Goal: Risk for impaired skin integrity will decrease 12/03/2024 0924 by Norine Colman BIRCH, RN Outcome: Adequate for Discharge 12/03/2024 0924 by Norine Colman BIRCH, RN Outcome: Adequate for Discharge 12/03/2024 0924 by Norine Colman BIRCH, RN Outcome: Progressing

## 2024-12-03 NOTE — TOC Transition Note (Addendum)
 Transition of Care [facility]) - Discharge Note   Patient Details  Name: Matthew [name]. MRN: [id number] Date of Birth: [birth year]  [facility] CM/SW Contact:  Waddell Barnie Rama, RN Phone Number: 12/03/2024, 9:08 AM   Clinical Narrative:    For dc today, [employer] to supply the rollator  to the room, he is set up for OP PT on [facility].  He will need cab voucher in [place] [employer] [place].  He is positive for cocaine so no HH agency will send out anyone.   Final next level of care: Home/Self Care Barriers to Discharge: No Barriers Identified   Patient Goals and CMS Choice Patient states their goals for this hospitalization and ongoing recovery are:: return home   Choice offered to / list presented to : NA      Discharge Placement                       Discharge Plan and Services Additional resources added to the After Visit Summary for   In-house Referral: NA Discharge Planning Services: CM Consult Post Acute Care Choice: NA          DME Arranged: Walker rolling with seat DME Agency: [facility] Date DME Agency Contacted: 12/03/24 Time DME Agency Contacted: [phone] Representative spoke with at DME Agency: Ryan CHEADLE Arranged: NA          Social Drivers of Health (SDOH) Interventions SDOH Screenings   Food Insecurity: Food Insecurity Present (12/01/2024)  Housing: High Risk (12/01/2024)  Transportation Needs: Unmet Transportation Needs (12/01/2024)  Utilities: At Risk (12/01/2024)  Alcohol Screen: Low Risk (01/14/2024)  Depression (PHQ2-9): High Risk (11/04/2023)  Social Connections: Unknown (05/01/2022)   Received from [facility]  Tobacco Use: High Risk (11/30/2024)     Readmission Risk Interventions    12/03/2024    8:52 AM 06/30/2024    1:02 PM  Readmission Risk Prevention Plan  Transportation Screening Complete Complete  PCP or Specialist Appt within 5-7 Days  Complete  PCP or Specialist Appt within 3-5 Days Complete   Home Care Screening   Complete  Medication Review (RN CM)  Complete  HRI or Home Care Consult Complete   Palliative Care Screening Not Applicable   Medication Review (RN Care Manager) Complete

## 2024-12-03 NOTE — TOC Initial Note (Signed)
 Transition of Care (TOC) - Initial/Assessment Note    Patient Details  Name: Matthew [name]. MRN: [id number] Date of Birth: 1966-05-04  Transition of Care [facility]) CM/SW Contact:    Waddell Barnie Rama, RN Phone Number: 12/03/2024, 9:03 AM  Clinical Narrative:                 From home alone, ambulatory, has PCP and insurance on file, states has no HH services in place at this time or DME at home.  States he will need transport assistance to get  home at dc and  has no family support system, states gets medications from  [facility] clinic.  Pta self ambulatory.   Per pt eval rec HHPT,  Patient would like to do OP PT at OP rehabilitation on [facility].  NCM will make referral thru epic. He will need bus pass to get to that apt.  Expected Discharge Plan: Home/Self Care Barriers to Discharge: No Barriers Identified   Patient Goals and CMS Choice Patient states their goals for this hospitalization and ongoing recovery are:: return home   Choice offered to / list presented to : NA      Expected Discharge Plan and Services In-house Referral: NA Discharge Planning Services: CM Consult Post Acute Care Choice: NA Living arrangements for the past 2 months: Single Family Home Expected Discharge Date: 12/03/24               DME Arranged: Vannie rolling with seat DME Agency: [facility] Date DME Agency Contacted: 12/03/24 Time DME Agency Contacted: [phone] Representative spoke with at DME Agency: [facility] Arranged: NA          Prior Living Arrangements/Services Living arrangements for the past 2 months: Single Family Home Lives with:: Self                   Activities of Daily Living      Permission Sought/Granted                  Emotional Assessment              Admission diagnosis:  GAD (generalized anxiety disorder) [F41.1] Noncompliance with medication regimen [Z91.148] Chronic combined systolic and diastolic congestive heart failure (HCC)  [I50.42] Anxiety and depression [F41.9, F32.A] Hyperlipidemia associated with type 2 diabetes mellitus (HCC) [E11.69, E78.5] Hypertensive heart disease with acute on chronic combined systolic and diastolic congestive heart failure (HCC) [I11.0, I50.43] Acute on chronic HFrEF (heart failure with reduced ejection fraction) (HCC) [I50.23] Acute congestive heart failure, unspecified heart failure type (HCC) [I50.9] Patient Active Problem List   Diagnosis Date Noted   Acute on chronic HFrEF (heart failure with reduced ejection fraction) (HCC) 11/30/2024   Respiratory distress 06/28/2024   MDD (major depressive disorder) 01/14/2024   Panic disorder 01/14/2024   Cocaine use disorder, severe, dependence (HCC) 01/14/2024   Acute on chronic hypoxic respiratory failure (HCC) 11/16/2023   Acute pulmonary edema (HCC) 11/16/2023   Cocaine use 11/16/2023   Acute hypoxic respiratory failure (HCC) 11/16/2023   GAD (generalized anxiety disorder) 06/28/2022   Cocaine abuse (HCC) 06/28/2022   MDD (major depressive disorder), recurrent episode 04/21/2022   MDD (major depressive disorder), recurrent, severe, with psychosis (HCC) 02/16/2022   MDD (major depressive disorder), recurrent episode, severe (HCC) 02/15/2022   Impacted cerumen of right ear 03/24/2021   MDD (major depressive disorder), recurrent severe, without psychosis (HCC) 01/03/2021   Decompensated heart failure (HCC) 12/24/2020   Acute respiratory failure with  hypoxia (HCC) 02/15/2020   Acute on chronic combined systolic and diastolic CHF (congestive heart failure) (HCC) 02/15/2020   Hypertensive urgency 02/15/2020   CHF (congestive heart failure) (HCC) 02/15/2020   Depression due to physical illness 11/18/2018   Expressive aphasia 02/20/2017   Mixed hyperlipidemia    History of stroke 01/31/2017   Erectile dysfunction 07/30/2016   OSA (obstructive sleep apnea) 05/10/2015   CKD (chronic kidney disease) stage 2, GFR 60-89 ml/min 03/10/2014    Diabetes mellitus type II, controlled (HCC) 03/10/2014   Chronic combined systolic and diastolic congestive heart failure (HCC) 03/03/2014   Nicotine  use disorder    Morbid obesity (HCC)    Hypertension 03/02/2014   PCP:  [name], MD Pharmacy:   [place] [facility] [address] [place] [address] Phone: [phone] Fax: [phone]     Social Drivers of Health (SDOH) Social History: SDOH Screenings   Food Insecurity: Food Insecurity Present (12/01/2024)  Housing: High Risk (12/01/2024)  Transportation Needs: Unmet Transportation Needs (12/01/2024)  Utilities: At Risk (12/01/2024)  Alcohol Screen: Low Risk (01/14/2024)  Depression (PHQ2-9): High Risk (11/04/2023)  Social Connections: Unknown (05/01/2022)   Received from [facility]  Tobacco Use: High Risk (11/30/2024)   SDOH Interventions:     Readmission Risk Interventions    12/03/2024    8:52 AM 06/30/2024    1:02 PM  Readmission Risk Prevention Plan  Transportation Screening Complete Complete  PCP or Specialist Appt within 5-7 Days  Complete  PCP or Specialist Appt within 3-5 Days Complete   Home Care Screening  Complete  Medication Review (RN CM)  Complete  HRI or Home Care Consult Complete   Palliative Care Screening Not Applicable   Medication Review (RN Care Manager) Complete

## 2024-12-03 NOTE — Plan of Care (Signed)

## 2024-12-03 NOTE — Progress Notes (Signed)
 Physical Therapy Treatment Patient Details Name: Matthew [name]. MRN: [id number] DOB: [birth year] Today's Date: 12/03/2024   History of Present Illness 58 y.o. male presents to [facility] 11/29/24 with SOB and ankle swelling. Admitted with acute exacerbation of CHF. CTA chest showed mild bilateral airspace opacities suggestive of edema. PMHx: hypertension, type 2 diabetes mellitus, history of CVA, CKD stage II, cocaine abuse, OSA, and cardiomyopathy with EF less than 20%   PT Comments  Pt received in supine and agreeable to PT session. Session focused on safety for return home with education on use of rollator. Pt was ModI for all mobility with ability to ambulate 18ft with rollator before becoming fatigued. Discussed energy conservation techniques with pt verbalizing understanding. As pt does not have HH benefits, now recommending OP PT. Acute PT to follow.     If plan is discharge home, recommend the following: Assist for transportation;Help with stairs or ramp for entrance   Can travel by private vehicle      Yes  Equipment Recommendations  Rollator (4 wheels)       Precautions / Restrictions Restrictions Weight Bearing Restrictions Per Provider Order: No     Mobility  Bed Mobility Overal bed mobility: Modified Independent    General bed mobility comments: use of bed rails    Transfers Overall transfer level: Modified independent Equipment used: Rollator (4 wheels)    General transfer comment: cues for technique with use of rollator    Ambulation/Gait Ambulation/Gait assistance: Modified independent (Device/Increase time) Gait Distance (Feet): 15 Feet (x10) Assistive device: Rollator (4 wheels), None Gait Pattern/deviations: Step-through pattern, Decreased stride length Gait velocity: decr     General Gait Details: Able to negotiate safely with use of rollator. Educated on pushing rollator against a wall prior to sitting. Also able to ambulate a short distance with no AD.  Distance limited by fatigue    Balance Overall balance assessment: Needs assistance, Mild deficits observed, not formally tested Sitting-balance support: No upper extremity supported, Feet supported Sitting balance-Leahy Scale: Good     Standing balance support: No upper extremity supported Standing balance-Leahy Scale: Fair     Hotel Manager: No apparent difficulties  Cognition Arousal: Alert Behavior During Therapy: WFL for tasks assessed/performed   PT - Cognitive impairments: No apparent impairments    Following commands: Intact      Cueing Cueing Techniques: Verbal cues     General Comments General comments (skin integrity, edema, etc.): Reported slight dizziness after removal of IV      Pertinent Vitals/Pain Pain Assessment Pain Assessment: Faces Faces Pain Scale: Hurts little more Pain Location: low back Pain Descriptors / Indicators: Discomfort Pain Intervention(s): Limited activity within patient's tolerance, Monitored during session, Repositioned     PT Goals (current goals can now be found in the care plan section) Acute Rehab PT Goals PT Goal Formulation: With patient Time For Goal Achievement: 12/16/24 Potential to Achieve Goals: Good Progress towards PT goals: Progressing toward goals    Frequency    Min 2X/week       AM-PAC PT 6 Clicks Mobility   Outcome Measure  Help needed turning from your back to your side while in a flat bed without using bedrails?: None Help needed moving from lying on your back to sitting on the side of a flat bed without using bedrails?: None Help needed moving to and from a bed to a chair (including a wheelchair)?: None Help needed standing up from a chair using your arms (e.g., wheelchair or  bedside chair)?: None Help needed to walk in hospital room?: None Help needed climbing 3-5 steps with a railing? : A Little 6 Click Score: 23    End of Session   Activity Tolerance: Patient  limited by fatigue Patient left: in bed;with call bell/phone within reach Nurse Communication: Mobility status;Other (comment) (in room during most of session) PT Visit Diagnosis: Unsteadiness on feet (R26.81);Other abnormalities of gait and mobility (R26.89)     Time: 9094-9079 PT Time Calculation (min) (ACUTE ONLY): 15 min  Charges:    $Therapeutic Activity: 8-22 mins PT General Charges $$ ACUTE PT VISIT: 1 Visit                    Kate ORN, PT, DPT Secure Chat Preferred  Rehab Office [phone]   Kate BRAVO Wendolyn 12/03/2024, 9:29 AM

## 2024-12-04 ENCOUNTER — Telehealth: Payer: Self-pay | Admitting: *Deleted

## 2024-12-04 ENCOUNTER — Inpatient Hospital Stay: Payer: Self-pay | Admitting: Family

## 2024-12-04 NOTE — Progress Notes (Signed)
 Erroneous encounter-disregard

## 2024-12-04 NOTE — Transitions of Care (Post Inpatient/ED Visit) (Signed)
" ° °  12/04/2024  Name: Matthew [name]. MRN: [id number] DOB: 09/27/1966  Today's TOC FU Call Status: Today's TOC FU Call Status:: Unsuccessful Call (1st Attempt) Unsuccessful Call (1st Attempt) Date: 12/04/24  Attempted to reach the patient regarding the most recent Inpatient/ED visit.  Follow Up Plan: Additional outreach attempts will be made to reach the patient to complete the Transitions of Care (Post Inpatient/ED visit) call.   Andrea Dimes RN, BSN [place]  [facility] RN Care Manager [phone]  "

## 2024-12-04 NOTE — Transitions of Care (Post Inpatient/ED Visit) (Signed)
" ° °  12/04/2024  Name: Matthew [name]. MRN: [id number] DOB: [birth year]  Today's TOC FU Call Status: Today's TOC FU Call Status:: Unsuccessful Call (2nd Attempt) Unsuccessful Call (2nd Attempt) Date: 12/04/24  Attempted to reach the patient regarding the most recent Inpatient/ED visit.  Follow Up Plan: Additional outreach attempts will be made to reach the patient to complete the Transitions of Care (Post Inpatient/ED visit) call.   Andrea Dimes RN, BSN [place]  [facility] RN Care Manager [phone]  "

## 2024-12-07 ENCOUNTER — Telehealth: Payer: Self-pay | Admitting: *Deleted

## 2024-12-07 ENCOUNTER — Telehealth (HOSPITAL_COMMUNITY): Payer: Self-pay | Admitting: Licensed Clinical Social Worker

## 2024-12-07 ENCOUNTER — Telehealth: Payer: Self-pay

## 2024-12-07 NOTE — Telephone Encounter (Signed)
 H&V Care Navigation CSW Progress Note  Clinical Social Worker consulted to assist pt in transportation for tomorrows appt. Pt confirms he has no transportation available- arranged taxi to bring to tomorrows appt- pick up from appt at 9:30am  SDOH Screenings   Food Insecurity: Food Insecurity Present (12/01/2024)  Housing: High Risk (12/01/2024)  Transportation Needs: Unmet Transportation Needs (12/01/2024)  Utilities: At Risk (12/01/2024)  Alcohol Screen: Low Risk (01/14/2024)  Depression (PHQ2-9): High Risk (11/04/2023)  Social Connections: Unknown (05/01/2022)   Received from [facility]  Tobacco Use: High Risk (11/30/2024)    Andriette HILARIO Leech, LCSW Clinical Social Worker [facility] Desk#: [phone] Cell#: [phone]

## 2024-12-07 NOTE — Transitions of Care (Post Inpatient/ED Visit) (Signed)
 "  12/07/2024  Name: Matthew [name]. MRN: [id number] DOB: [birth year]  Today's TOC FU Call Status: Today's TOC FU Call Status:: Successful TOC FU Call Completed TOC FU Call Complete Date: 12/07/24  Patient's Name and Date of Birth confirmed. Name, DOB  Transition Care Management Follow-up Telephone Call Date of Discharge: 12/03/24 Discharge Facility: [facility]) Type of Discharge: Inpatient Admission Primary Inpatient Discharge Diagnosis:: Acute on chronic HFrEF How have you been since you were released from the hospital?: Worse  Items Reviewed: Did you receive and understand the discharge instructions provided?: Yes Medications obtained,verified, and reconciled?: Yes (Medications Reviewed) Any new allergies since your discharge?: No Dietary orders reviewed?: Yes Type of Diet Ordered:: Heart Healthy, low sodium Do you have support at home?: No  Medications Reviewed Today: Medications Reviewed Today     Reviewed by Lucky Andrea LABOR, RN (Registered Nurse) on 12/07/24 at 1352  Med List Status: <None>   Medication Order Taking? Sig Documenting Provider Last Dose Status Informant  amLODipine  (NORVASC ) 10 MG tablet [id number] Yes Take 1 tablet (10 mg total) by mouth daily. Skip the dose if systolic BP less than 130 mmHg Fairy Frames, MD  Active   atorvastatin  (LIPITOR ) 80 MG tablet [id number] Yes Take 1 tablet (80 mg total) by mouth daily. Fairy Frames, MD  Active   diclofenac  Sodium (VOLTAREN ) 1 % GEL [id number] Yes Apply 2 g topically 4 (four) times daily. Fairy Frames, MD  Active   gabapentin  (NEURONTIN ) 100 MG capsule [id number] Yes Take 1 capsule (100 mg total) by mouth every 8 (eight) hours. Fairy Frames, MD  Active   hydrOXYzine  (ATARAX ) 10 MG tablet [id number] Yes Take 1 tablet (10 mg total) by mouth 3 (three) times daily as needed for itching or anxiety. Von Bellis, MD  Active Self  losartan  (COZAAR ) 25 MG tablet [id number] Yes Take 1 tablet (25 mg total) by  mouth daily. Fairy Frames, MD  Active     Discontinued 09/02/14 1547 (Reorder)   melatonin 5 MG TABS [id number]  Take 1 tablet (5 mg total) by mouth at bedtime.  Patient not taking: Reported on 12/07/2024   [name], MD  Active Self  sertraline  (ZOLOFT ) 100 MG tablet [id number] Yes Take 1.5 tablets (150 mg total) by mouth daily. Fairy Frames, MD  Active   spironolactone  (ALDACTONE ) 25 MG tablet [id number] Yes Take 1 tablet (25 mg total) by mouth daily. Fairy Frames, MD  Active   timolol  (TIMOPTIC ) 0.5 % ophthalmic solution [id number]  Place 1 drop into both eyes daily after breakfast.  Patient not taking: Reported on 12/07/2024   Von Bellis, MD  Active Self  torsemide  (DEMADEX ) 20 MG tablet [id number] Yes Take 1 tablet (20 mg total) by mouth daily. Fairy Frames, MD  Active   traMADol  (ULTRAM ) 50 MG tablet [id number] Yes Take 1 tablet (50 mg total) by mouth 3 (three) times daily as needed for moderate pain (pain score 4-6). Fairy Frames, MD  Active             Home Care and Equipment/Supplies: Were Home Health Services Ordered?: No Any new equipment or medical supplies ordered?: Yes Name of Medical supply agency?: [employer] received RW prior discharge Were you able to get the equipment/medical supplies?: No Do you have any questions related to the use of the equipment/supplies?: No  Functional Questionnaire: Do you need assistance with bathing/showering or dressing?: Yes Do you need assistance with meal preparation?: Yes Do you need assistance with eating?: Yes Do you have  difficulty maintaining continence: Yes Do you need assistance with getting out of bed/getting out of a chair/moving?: No Do you have difficulty managing or taking your medications?: No  Follow up appointments reviewed: PCP Follow-up appointment confirmed?: No (RNCM assisted with scheduling hospital follow up on 12/24/24 at 3:30pm) MD Provider Line Number:[phone] Given: No Specialist Hospital  Follow-up appointment confirmed?: Yes Date of Specialist follow-up appointment?: 12/08/24 Follow-Up Specialty Provider:: [facility] Do you need transportation to your follow-up appointment?: Yes Transportation Need Intervention Addressed By:: Provider Office Notified (Transportation arraged by HF Clinic LCSW) Do you understand care options if your condition(s) worsen?: Yes-patient verbalized understanding  SDOH Interventions Today    Flowsheet Row Most Recent Value  SDOH Interventions   Food Insecurity Interventions Intervention Not Indicated  Housing Interventions Intervention Not Indicated  [Patient currently has stable housing]  Utilities Interventions Intervention Not Indicated   Secure message to HF Clinic regarding patient needing transportation to the appointment on 12/08/24. DOROTHA Leech, LCSW arranged transport. Patient is active with Case Manager/Ms. Little with [employer]. RNCM advised patient to contact Ms. Little with barriers or needs. Patient voiced understanding.  Andrea Dimes RN, BSN [place]  [facility] Health RN Care Manager [phone]  "

## 2024-12-07 NOTE — Telephone Encounter (Signed)
 Called to confirm/remind patient of their appointment at the [facility] on 12/08/24.   Appointment:   [x] Confirmed  [] Left mess   [] No answer/No voice mail  [] VM Full/unable to leave message  [] Phone not in service  Patient reminded to bring all medications and/or complete list.  Confirmed patient has transportation. Gave directions, instructed to utilize valet parking.

## 2024-12-08 ENCOUNTER — Encounter (HOSPITAL_COMMUNITY): Payer: Self-pay

## 2024-12-08 ENCOUNTER — Ambulatory Visit (HOSPITAL_COMMUNITY)
Admit: 2024-12-08 | Discharge: 2024-12-08 | Disposition: A | Discharge status: Home / Self Care | Attending: Adult Health | Admitting: Adult Health

## 2024-12-08 VITALS — BP 122/86 | HR 54 | Ht 69.0 in | Wt 146.2 lb

## 2024-12-08 DIAGNOSIS — E1122 Type 2 diabetes mellitus with diabetic chronic kidney disease: Secondary | ICD-10-CM | POA: Insufficient documentation

## 2024-12-08 DIAGNOSIS — Z5982 Transportation insecurity: Secondary | ICD-10-CM | POA: Diagnosis not present

## 2024-12-08 DIAGNOSIS — G4733 Obstructive sleep apnea (adult) (pediatric): Secondary | ICD-10-CM | POA: Insufficient documentation

## 2024-12-08 DIAGNOSIS — I130 Hypertensive heart and chronic kidney disease with heart failure and stage 1 through stage 4 chronic kidney disease, or unspecified chronic kidney disease: Secondary | ICD-10-CM | POA: Diagnosis present

## 2024-12-08 DIAGNOSIS — E785 Hyperlipidemia, unspecified: Secondary | ICD-10-CM | POA: Insufficient documentation

## 2024-12-08 DIAGNOSIS — Z79899 Other long term (current) drug therapy: Secondary | ICD-10-CM | POA: Diagnosis not present

## 2024-12-08 DIAGNOSIS — Z8673 Personal history of transient ischemic attack (TIA), and cerebral infarction without residual deficits: Secondary | ICD-10-CM | POA: Diagnosis not present

## 2024-12-08 DIAGNOSIS — I5082 Biventricular heart failure: Secondary | ICD-10-CM | POA: Diagnosis not present

## 2024-12-08 DIAGNOSIS — F1410 Cocaine abuse, uncomplicated: Secondary | ICD-10-CM | POA: Diagnosis not present

## 2024-12-08 DIAGNOSIS — I5022 Chronic systolic (congestive) heart failure: Secondary | ICD-10-CM

## 2024-12-08 DIAGNOSIS — I5040 Unspecified combined systolic (congestive) and diastolic (congestive) heart failure: Secondary | ICD-10-CM | POA: Diagnosis not present

## 2024-12-08 DIAGNOSIS — N183 Chronic kidney disease, stage 3 unspecified: Secondary | ICD-10-CM | POA: Diagnosis not present

## 2024-12-08 DIAGNOSIS — Z5989 Other problems related to housing and economic circumstances: Secondary | ICD-10-CM | POA: Insufficient documentation

## 2024-12-08 DIAGNOSIS — Z8249 Family history of ischemic heart disease and other diseases of the circulatory system: Secondary | ICD-10-CM | POA: Insufficient documentation

## 2024-12-08 DIAGNOSIS — N1831 Chronic kidney disease, stage 3a: Secondary | ICD-10-CM | POA: Insufficient documentation

## 2024-12-08 DIAGNOSIS — F17210 Nicotine dependence, cigarettes, uncomplicated: Secondary | ICD-10-CM | POA: Insufficient documentation

## 2024-12-08 DIAGNOSIS — Z791 Long term (current) use of non-steroidal anti-inflammatories (NSAID): Secondary | ICD-10-CM | POA: Diagnosis not present

## 2024-12-08 NOTE — Progress Notes (Signed)
 "  Advanced Heart Failure Clinic Note    PCP: [name], MD HF Cardiologist: Dr. Rolan  Reason for Visit: [facility] HF Follow Up.  HPI: Matthew Petersonis a 57 y.o. with PMH significant for combined systolic CHF with biventricular failure, T2DM, HTN, OSA, tobacco use, HLD, cocaine use, poor medication adherence, depression,  CVA, and CKD 3a.     He was first admitted with HF in 2015 in the setting of long standing uncontrolled hypertension and smoking.  Had a LHC with normal cors.    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  Was not followed by cardiology or hospitalized for CHF during this time period.     March 2021 he began having CHF exacerbations after running out of meds between admissions.  Left AMA in March. Another similar admission in July, he was out of his medications at that time as well.     Admitted 12/2020 with CHF exacerbation after running out of his medications.  He was diuresed, put on an excellent medication regimen. He also endorsed suicidal ideation at that time and was subsequently admitted to behavioral health. UDS was positive for cocaine, echo showed EF < 20% with moderate RV dysfunction.   He was seen in the HF clinic 02/08/21 and lasix  was cut back to 20 mg daily, later Lasix  was stopped.   Echo 5/22 with EF improved to 50%.   Follow up 6/22, Farxiga  was stopped due to dizziness.  Follow up 9/22 and was fluid overloaded and SOB. Had ran out of all of his meds, including lasix  ~3 months prior. ReDs 46%. Given Lasix  80 mg IV x 1 in the office, GDMT adjusted. Instructed to f/u in 1 week but no showed to the appt.   Follow up 11/22 he had been off several medications, more SOB, continued to use cocaine. Mediations restarted, pillbox filled. Seen back 12/22, stable NYHA III, volume OK ReDs 35%. Spiro restarted and advised 4 week follow up. No show x 2 appts.   Echo in 1/23 with EF 30-35%, mild LVH,  normal RV.   Admitted 3/23 with SI and auditory hallucinations.   He was last seen in the HF clinic 2024  Admitted 06/2024 with A/C HFrEF. UDS + cocaine. Out of meds x 30 days. Diuresed with IV lasix .   Admitted 11/29/24 with A/C HFrEF. Used crack cocaine 11/27/2024. Off meds for Diuresed with IV lasix  and transitioned to torsemide  20 mg daily . GDMT Losartan  and Spironolactone . No SGLT2i or ARNi due to previous intolerance.   Today he returns for post HF follow up. Hungry. Does not have food at home. SOB with exertion. Denies PND/Orthopnea. Appetite ok. No fever or chills. No crack since December 11/2024. He didn't take his medications this morning because he didn't have food to eat. Taking all medications. Lives alone in apartment. He used to get food stamps. Needs to recertify Needs assistance with transportation.  Labs (5/22): K 3.9, creatinine 1.6 Labs (6/22): K 3.8, creatinine 1.49 Labs (8/22): K 4.6 creatinine 1.25 Labs (9/22): K 4.1, creatinine 1.29, LDL 40  Labs (11/22): K 3.7 creatinine 1.91 Labs (3/23): K 4.2, creatinine 1.2, LDL 101, HDL 54 Labs (5/23): K 3.6, creatinine 1.23 Labs (8/23): K 4.4, SCr 1.4 Labs (8/24): K 3.8, SCr 1.46 Labs (12/01/24):K 4.4 Creatinine 1.48   PMH: 1. HTN 2. Type 2 diabetes 3. H/o cocaine abuse: Positive UDS 1/22.  4. CVA 2018 5. OSA: Uses  CPAP.  6. Chronic systolic CHF: Nonischemic cardiomyopathy.  - LHC (2015): Normal coronaries.  - Echo (2021): EF 40-45%. - Echo (1/22): EF <20%, moderate LV dilation, moderately decreased RV systolic function, severe LAE.  - Echo (5/22): EF 50%, moderate LV dilation, normal RV.  - Echo (1/23): EF 30-35%, moderately decreased LV, normal RV, ascending aorta 39 mm - Echo today: read pending 7. Depression 8. CKD stage 3 9. Hyperlipidemia  Current Outpatient Medications  Medication Sig Dispense Refill   amLODipine  (NORVASC ) 10 MG tablet Take 1 tablet (10 mg total) by mouth daily. Skip the dose if systolic  BP less than 130 mmHg 30 tablet 1   atorvastatin  (LIPITOR ) 80 MG tablet Take 1 tablet (80 mg total) by mouth daily. 30 tablet 1   diclofenac  Sodium (VOLTAREN ) 1 % GEL Apply 2 g topically 4 (four) times daily. 100 g 0   gabapentin  (NEURONTIN ) 100 MG capsule Take 1 capsule (100 mg total) by mouth every 8 (eight) hours. 90 capsule 1   hydrOXYzine  (ATARAX ) 10 MG tablet Take 1 tablet (10 mg total) by mouth 3 (three) times daily as needed for itching or anxiety. 30 tablet 0   losartan  (COZAAR ) 25 MG tablet Take 1 tablet (25 mg total) by mouth daily. 30 tablet 1   melatonin 5 MG TABS Take 1 tablet (5 mg total) by mouth at bedtime.     sertraline  (ZOLOFT ) 100 MG tablet Take 1.5 tablets (150 mg total) by mouth daily. 45 tablet 0   spironolactone  (ALDACTONE ) 25 MG tablet Take 1 tablet (25 mg total) by mouth daily. 30 tablet 1   torsemide  (DEMADEX ) 20 MG tablet Take 1 tablet (20 mg total) by mouth daily. 30 tablet 1   traMADol  (ULTRAM ) 50 MG tablet Take 1 tablet (50 mg total) by mouth 3 (three) times daily as needed for moderate pain (pain score 4-6). 30 tablet 0   timolol  (TIMOPTIC ) 0.5 % ophthalmic solution Place 1 drop into both eyes daily after breakfast. (Patient not taking: Reported on 12/07/2024) 5 mL 0   No current facility-administered medications for this encounter.   No Known Allergies  Social History   Socioeconomic History   Marital status: Widowed    Spouse name: Not on file   Number of children: 2   Years of education: 14   Highest education level: Not on file  Occupational History   Occupation: [employer]  Tobacco Use   Smoking status: Every Day    Types: Cigarettes   Smokeless tobacco: Former    Quit date: 11/14/2020  Vaping Use   Vaping status: Never Used  Substance and Sexual Activity   Alcohol use: No   Drug use: Yes    Types: Cocaine   Sexual activity: Yes  Other Topics Concern   Not on file  Social History Narrative   Lives with 2 young children in [place].  Wife  died in 29-May-2020.  Works as a chartered certified accountant in web designer.  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   Left-handed   Caffeine: occasional tea   Social Drivers of Health   Tobacco Use: High Risk (12/08/2024)   Patient History    Smoking Tobacco Use: Every Day    Smokeless Tobacco Use: Former    Passive Exposure: Not on Actuary Strain: Not on file  Food Insecurity: Food Insecurity Present (12/07/2024)   Epic    Worried About Radiation Protection Practitioner of Food in the Last Year: Sometimes true    Ran Out of  Food in the Last Year: Sometimes true  Transportation Needs: Unmet Transportation Needs (12/07/2024)   Epic    Lack of Transportation (Medical): Yes    Lack of Transportation (Non-Medical): Yes  Physical Activity: Not on file  Stress: Not on file  Social Connections: Unknown (05/01/2022)   [facility]   Social Network    Social Network: Not on file  Intimate Partner Violence: Not At Risk (12/07/2024)   Epic    Fear of Current or Ex-Partner: No    Emotionally Abused: No    Physically Abused: No    Sexually Abused: No  Depression (PHQ2-9): Low Risk (12/07/2024)   Depression (PHQ2-9)    PHQ-2 Score: 2  Alcohol Screen: Low Risk (01/14/2024)   Alcohol Screen    Last Alcohol Screening Score (AUDIT): 0  Housing: High Risk (12/07/2024)   Epic    Unable to Pay for Housing in the Last Year: Yes    Number of Times Moved in the Last Year: Not on file    Homeless in the Last Year: No  Utilities: At Risk (12/07/2024)   Epic    Threatened with loss of utilities: Yes  Health Literacy: Not on file   Family History  Problem Relation Age of Onset   Heart attack Father        died @ 42   Cervical cancer Mother        died @ 72   BP 122/86   Pulse (!) 54   Ht 5' 9 (1.753 m)   Wt 66.3 kg (146 lb 3.2 oz)   SpO2 100%   BMI 21.59 kg/m   Wt Readings from Last 3 Encounters:  12/08/24 66.3 kg (146 lb 3.2 oz)  12/03/24 63 kg (138 lb 14.4 oz)  06/29/24 65.1 kg (143 lb 8.3 oz)    PHYSICAL EXAM: General:  Appears thin.  No resp difficulty Neck: no JVD.  Cor: Regular rate & rhythm.  Lungs: clear Abdomen: soft, nontender, nondistended.  Extremities: no  edema Neuro: alert & oriented x3  ASSESSMENT & PLAN: 1. 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  Due to poorly controlled HTN +/- cocaine abuse.  Normal coronaries on 2015 cath.  Echo in 1/22 showed EF < 20% with moderate RV dysfunction (off meds, + cocaine).  Echo in 5/22 with EF up to 50% (on meds and off cocaine). Echo 1/23 (off meds and on cocaine) EF down to 30-35%. Echo 7/24 EF 25-30%, GIDD, RV normal, LA mildly dilated, small pericardial effusion present.  Most recent Echo &/2025 LVEF severely reduced <20% and RV normal.  Main issue today seems to SDOH needs with food insecurity and drug issues.  NYHA III. Appears euvolemic. Continue torsemide  20 mg daily  - Has not been on Entresto  2/2 headache? per patient report. Will not restart today with elevated renal function - Continue spironolactone  25 mg daily.  - Did not tolerate Farxiga  due to dizziness  2. HTN:  Stable  3. OSA: Not compliant with CPAP, needs new one.  4. Tobacco Use Disorder:  He hasn't smoked since Thanksgiving.  5. Cocaine Use Disorder:  UDS + cocaine  06/2024 . Used Crack cocaine 11/27/2024. Wants to stop. Referred to HFSW for assistance.  6. CKD 3: Stage IIIa Creatinine 1.5.  7. Hyperlipidemia: Continue atorvastatin .  - LDL 16 8/23. Up to 103 7/24.  8. Depression: Controlled.  9. T2DM: management per PCP. - Did not tolerate Farxiga  due to dizziness. 10. Hx of CVA:  01/2017 corpus callosum  and right parietal infarct, no residual deficits - On statin & ASA. 11. Medication Noncompliance:  Last few admissions he had been out of his medications for some time.   Follow up 2-3 weeks with all medications. Refer to HFSW for rehab. He was given a bag of food in the clinic.   Greig Mosses, NP 12/08/2024 "

## 2024-12-08 NOTE — Progress Notes (Signed)
 " Heart and Vascular Care Navigation  12/08/2024  Matthew [name]. [birth year] [id number]  Reason for Referral: substance abuse and food insecurity.   Engaged with patient face to face for follow up visit for Heart and Vascular Care Coordination.                                                                                                   Assessment:         CSW met with pt to discuss current SDOH concerns.  Pt living in apartment but having trouble affording food.  Was getting over $200 in food stamps but recertification lapsed and now without that extra money to get food.  Provided with [employer] info to call and get help reapplying.  Provided with Heart and Vascular Food Bag and helped patient get to [facility] following clinic visit to get food from their pantry as well.  Patient continues to struggle with substance use last used cocaine 12/12.  States this relapse was due to stress of the holidays and remembering the loss of his wife years ago.  This continues to be what he struggles with most- provided with number to [facility]reavement Counseling line.   Also interested in going back into inpatient rehab- had been in [facility] for a year not too long ago.  CSW provided with information and encouraged him to call to set up appt for intake.                              HRT/VAS Care Coordination     Patients Home Cardiology Office Heart Failure Clinic   Outpatient Care Team [facility] Paramedic Name: Powell Slade- [phone]   Social Worker Name: Matthew [name]- Advanced Heart Failure Clinic- [phone]   Living arrangements for the past 2 months Apartment   Lives with: Self   Patient Current Insurance Coverage Medicaid; Traditional Medicare   Patient Has Concern With Paying Medical Bills No   Patient Concerns With Medical Bills referred to [facility]   Does Patient Have Prescription Coverage? Yes    Patient Prescription Assistance Programs Heart Failure [facility] Assistive Devices/Equipment Blood pressure cuff; Eyeglasses; Walker (specify type)   DME Agency [facility]       Social History:                                                                             SDOH Screenings   Food Insecurity: Food Insecurity Present (12/07/2024)  Housing: High Risk (12/07/2024)  Transportation Needs: Unmet Transportation Needs (12/07/2024)  Utilities: At Risk (12/07/2024)  Alcohol Screen: Low Risk (01/14/2024)  Depression (PHQ2-9): Low Risk (12/07/2024)  Social Connections: Unknown (05/01/2022)   Received from [facility]  Tobacco Use: High Risk (12/08/2024)      Follow-up plan:    CSW will call pt to follow up re referrals.  Matthew HILARIO Leech, LCSW Clinical Social Worker Advanced Heart Failure Clinic Desk#: [phone] Cell#: [phone]    "

## 2024-12-08 NOTE — Patient Instructions (Signed)
 PLEASE BRING ALL YOUR MEDICATIONS TO YOUR NEXT APPOINTMENT.  Your physician recommends that you schedule a follow-up appointment in: AS SCHEDULED.  If you have any questions or concerns before your next appointment please send us  a message through [place] or call our office at [phone].    TO LEAVE A MESSAGE FOR THE NURSE SELECT OPTION 2, PLEASE LEAVE A MESSAGE INCLUDING: YOUR NAME DATE OF BIRTH CALL BACK NUMBER REASON FOR CALL**this is important as we prioritize the call backs  YOU WILL RECEIVE A CALL BACK THE SAME DAY AS LONG AS YOU CALL BEFORE 4:00 PM  At the Advanced Heart Failure Clinic, you and your health needs are our priority. As part of our continuing mission to provide you with exceptional heart care, we have created designated Provider Care Teams. These Care Teams include your primary Cardiologist (physician) and Advanced Practice Providers (APPs- Physician Assistants and Nurse Practitioners) who all work together to provide you with the care you need, when you need it.   You may see any of the following providers on your designated Care Team at your next follow up: Dr Toribio Fuel Dr Ezra Shuck Dr. Morene Brownie Greig Mosses, NP Caffie Shed, [place] [facility] [place], [place] Beckey Coe, NP Jordan Lee, NP Ellouise Class, NP Tinnie Redman, PharmD Jaun Bash, PharmD   Please be sure to bring in all your medications bottles to every appointment.    Thank you for choosing [facility]-Advanced Heart Failure Clinic

## 2024-12-21 ENCOUNTER — Telehealth (HOSPITAL_COMMUNITY): Payer: Self-pay | Admitting: Licensed Clinical Social Worker

## 2024-12-21 NOTE — Telephone Encounter (Signed)
 H&V Care Navigation CSW Progress Note  Clinical Social Worker called pt to follow up regarding SNAP and rehab.  Patient reports he has not applied for SNAP at this time- CSW assisted in applying online.  Patient has been speaking with [facility] but states no beds available.  Struggling with depression during the holidays but feeling better now that its over- aware of [facility] and mobile crisis numbers if needed  SDOH Screenings   Food Insecurity: Food Insecurity Present (12/07/2024)  Housing: High Risk (12/07/2024)  Transportation Needs: Unmet Transportation Needs (12/07/2024)  Utilities: At Risk (12/07/2024)  Alcohol Screen: Low Risk (01/14/2024)  Depression (PHQ2-9): Low Risk (12/07/2024)  Social Connections: Unknown (05/01/2022)   Received from [facility]  Tobacco Use: High Risk (12/08/2024)    Andriette HILARIO Leech, LCSW Clinical Social Worker Advanced Heart Failure Clinic Desk#: [phone] Cell#: [phone]

## 2024-12-23 ENCOUNTER — Telehealth: Payer: Self-pay | Admitting: Family Medicine

## 2024-12-23 NOTE — Telephone Encounter (Signed)
 Pt confirmed appt (per vr)

## 2024-12-24 ENCOUNTER — Ambulatory Visit: Payer: Self-pay | Admitting: *Deleted

## 2024-12-25 ENCOUNTER — Telehealth (HOSPITAL_COMMUNITY): Payer: Self-pay | Admitting: Licensed Clinical Social Worker

## 2024-12-25 NOTE — Telephone Encounter (Signed)
 Entered in error

## 2024-12-25 NOTE — Telephone Encounter (Signed)
 H&V Care Navigation CSW Progress Note  Clinical Social Worker spoke with pt to discuss lack of transportation to visit next week- patient confirms no ride available.  CSW arranged [employer] taxi to bring to appt- pick up from home 11am  Matthew HILARIO Leech, LCSW Clinical Social Worker Advanced Heart Failure Clinic Desk#: [phone] Cell#: [phone]

## 2024-12-29 ENCOUNTER — Other Ambulatory Visit (HOSPITAL_COMMUNITY): Payer: Self-pay

## 2024-12-29 ENCOUNTER — Encounter (HOSPITAL_COMMUNITY): Payer: Self-pay

## 2024-12-29 ENCOUNTER — Ambulatory Visit (HOSPITAL_COMMUNITY): Payer: Self-pay | Admitting: Physician Assistant

## 2024-12-29 ENCOUNTER — Ambulatory Visit (HOSPITAL_COMMUNITY)
Admission: RE | Admit: 2024-12-29 | Discharge: 2024-12-29 | Disposition: A | Source: Ambulatory Visit | Attending: Physician Assistant

## 2024-12-29 ENCOUNTER — Telehealth (HOSPITAL_COMMUNITY): Payer: Self-pay

## 2024-12-29 VITALS — BP 131/82 | HR 61 | Ht 69.0 in | Wt 147.4 lb

## 2024-12-29 DIAGNOSIS — Z8673 Personal history of transient ischemic attack (TIA), and cerebral infarction without residual deficits: Secondary | ICD-10-CM | POA: Insufficient documentation

## 2024-12-29 DIAGNOSIS — I5022 Chronic systolic (congestive) heart failure: Secondary | ICD-10-CM | POA: Insufficient documentation

## 2024-12-29 DIAGNOSIS — I428 Other cardiomyopathies: Secondary | ICD-10-CM | POA: Insufficient documentation

## 2024-12-29 DIAGNOSIS — E785 Hyperlipidemia, unspecified: Secondary | ICD-10-CM | POA: Diagnosis not present

## 2024-12-29 DIAGNOSIS — Z5982 Transportation insecurity: Secondary | ICD-10-CM | POA: Diagnosis not present

## 2024-12-29 DIAGNOSIS — N1831 Chronic kidney disease, stage 3a: Secondary | ICD-10-CM | POA: Insufficient documentation

## 2024-12-29 DIAGNOSIS — I5082 Biventricular heart failure: Secondary | ICD-10-CM | POA: Insufficient documentation

## 2024-12-29 DIAGNOSIS — Z79899 Other long term (current) drug therapy: Secondary | ICD-10-CM | POA: Diagnosis not present

## 2024-12-29 DIAGNOSIS — F1414 Cocaine abuse with cocaine-induced mood disorder: Secondary | ICD-10-CM | POA: Diagnosis not present

## 2024-12-29 DIAGNOSIS — I10 Essential (primary) hypertension: Secondary | ICD-10-CM

## 2024-12-29 DIAGNOSIS — G4733 Obstructive sleep apnea (adult) (pediatric): Secondary | ICD-10-CM | POA: Insufficient documentation

## 2024-12-29 DIAGNOSIS — E1122 Type 2 diabetes mellitus with diabetic chronic kidney disease: Secondary | ICD-10-CM | POA: Diagnosis not present

## 2024-12-29 DIAGNOSIS — I5020 Unspecified systolic (congestive) heart failure: Secondary | ICD-10-CM

## 2024-12-29 DIAGNOSIS — F3289 Other specified depressive episodes: Secondary | ICD-10-CM

## 2024-12-29 DIAGNOSIS — F32A Depression, unspecified: Secondary | ICD-10-CM | POA: Diagnosis not present

## 2024-12-29 DIAGNOSIS — F17210 Nicotine dependence, cigarettes, uncomplicated: Secondary | ICD-10-CM | POA: Diagnosis not present

## 2024-12-29 DIAGNOSIS — Z5941 Food insecurity: Secondary | ICD-10-CM | POA: Insufficient documentation

## 2024-12-29 DIAGNOSIS — I130 Hypertensive heart and chronic kidney disease with heart failure and stage 1 through stage 4 chronic kidney disease, or unspecified chronic kidney disease: Secondary | ICD-10-CM | POA: Insufficient documentation

## 2024-12-29 LAB — BASIC METABOLIC PANEL WITH GFR
Anion gap: 11 (ref 5–15)
BUN: 29 mg/dL — ABNORMAL HIGH (ref 6–20)
CO2: 30 mmol/L (ref 22–32)
Calcium: 9.6 mg/dL (ref 8.9–10.3)
Chloride: 98 mmol/L (ref 98–111)
Creatinine, Ser: 1.44 mg/dL — ABNORMAL HIGH (ref 0.61–1.24)
GFR, Estimated: 57 mL/min — ABNORMAL LOW
Glucose, Bld: 118 mg/dL — ABNORMAL HIGH (ref 70–99)
Potassium: 3.6 mmol/L (ref 3.5–5.1)
Sodium: 139 mmol/L (ref 135–145)

## 2024-12-29 LAB — PRO BRAIN NATRIURETIC PEPTIDE: Pro Brain Natriuretic Peptide: 1741 pg/mL — ABNORMAL HIGH

## 2024-12-29 MED ORDER — AMLODIPINE BESYLATE 10 MG PO TABS
10.0000 mg | ORAL_TABLET | Freq: Every day | ORAL | 5 refills | Status: AC
  Filled 2024-12-29: qty 30, 30d supply, fill #0

## 2024-12-29 MED ORDER — TORSEMIDE 20 MG PO TABS
20.0000 mg | ORAL_TABLET | Freq: Every day | ORAL | 5 refills | Status: AC
  Filled 2024-12-29: qty 30, 30d supply, fill #0

## 2024-12-29 MED ORDER — ATORVASTATIN CALCIUM 80 MG PO TABS
80.0000 mg | ORAL_TABLET | Freq: Every day | ORAL | 5 refills | Status: AC
  Filled 2024-12-29: qty 30, 30d supply, fill #0

## 2024-12-29 MED ORDER — SACUBITRIL-VALSARTAN 49-51 MG PO TABS
1.0000 | ORAL_TABLET | Freq: Two times a day (BID) | ORAL | 3 refills | Status: AC
  Filled 2024-12-29: qty 60, 30d supply, fill #0

## 2024-12-29 MED ORDER — SPIRONOLACTONE 25 MG PO TABS
25.0000 mg | ORAL_TABLET | Freq: Every day | ORAL | 5 refills | Status: AC
  Filled 2024-12-29: qty 30, 30d supply, fill #0

## 2024-12-29 MED ORDER — HYDROXYZINE HCL 10 MG PO TABS
10.0000 mg | ORAL_TABLET | Freq: Three times a day (TID) | ORAL | 0 refills | Status: AC | PRN
  Filled 2024-12-29: qty 30, 10d supply, fill #0

## 2024-12-29 NOTE — Progress Notes (Addendum)
 "  Advanced Heart Failure Clinic Note    PCP: [name], MD HF Cardiologist: Dr. Rolan  HPI: Matthew Petersonis a 59 y.o. with PMH significant for combined systolic CHF with biventricular failure, T2DM, HTN, OSA, tobacco use, HLD, cocaine use, poor medication adherence, depression,  CVA, and CKD 3a.     He was first admitted with HF in 2015 in the setting of long standing uncontrolled hypertension and smoking.  Had a LHC with normal cors.    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  Was not followed by cardiology or hospitalized for CHF during this time period.     March 2021 he began having CHF exacerbations after running out of meds between admissions.  Left AMA in March. Another similar admission in July, he was out of his medications at that time as well.     Admitted 12/2020 with CHF exacerbation after running out of his medications.  He was diuresed, put on an excellent medication regimen. He also endorsed suicidal ideation at that time and was subsequently admitted to behavioral health. UDS was positive for cocaine, echo showed EF < 20% with moderate RV dysfunction.   Echo 5/22 with EF improved to 50%.   Echo in 1/23 with EF 30-35%, mild LVH, normal RV.   Admitted 3/23 with SI and auditory hallucinations.   Admitted 06/2024 with A/C HFrEF. UDS + cocaine. Out of meds x 30 days. Diuresed with IV lasix .   Admitted 11/29/24 with A/C HFrEF. Used crack cocaine 11/27/2024. Off meds for some time. Diuresed with IV lasix  and transitioned to torsemide  20 mg daily.  Here today for CHF follow-up. Report some shortness of breath with exertion. No orthopnea, PND or lower extremity edema. Has lost a lot of weight over several years, used to weigh over 260 lb 10 years ago. Hasn't used crack/cocaine since December. He struggles more with depression around the holidays. Trying to get into [facility] for rehab, beds currently full. Taking all  meds, needs them refilled.  Lives alone in an apartment. Has completed application for food stamps. Currently has very little food at home. HF CSW helping with transportation. Has questions about medicaid and medicare benefits.   Labs (5/22): K 3.9, creatinine 1.6 Labs (6/22): K 3.8, creatinine 1.49 Labs (8/22): K 4.6 creatinine 1.25 Labs (9/22): K 4.1, creatinine 1.29, LDL 40  Labs (11/22): K 3.7 creatinine 1.91 Labs (3/23): K 4.2, creatinine 1.2, LDL 101, HDL 54 Labs (5/23): K 3.6, creatinine 1.23 Labs (8/23): K 4.4, SCr 1.4 Labs (8/24): K 3.8, SCr 1.46 Labs (12/01/24):K 4.4 Creatinine 1.48   PMH: 1. HTN 2. Type 2 diabetes 3. H/o crack/cocaine abuse: Last used 12/25 4. CVA 2018 5. OSA: Uses CPAP.  6. Chronic systolic CHF: Nonischemic cardiomyopathy.  - LHC (2015): Normal coronaries.  - Echo (2021): EF 40-45%. - Echo (1/22): EF <20%, moderate LV dilation, moderately decreased RV systolic function, severe LAE.  - Echo (5/22): EF 50%, moderate LV dilation, normal RV.  - Echo (1/23): EF 30-35%, moderately decreased LV, normal RV, ascending aorta 39 mm - Echo (7/25): EF < 20%, grade II DD, RV okay, severe LAE, moderate MR 7. Depression 8. CKD stage 3 9. Hyperlipidemia  Current Outpatient Medications  Medication Sig Dispense Refill   amLODipine  (NORVASC ) 10 MG tablet Take 1 tablet (10 mg total) by mouth daily. Skip the dose if systolic BP less than 130 mmHg 30 tablet 1   atorvastatin  (LIPITOR )  80 MG tablet Take 1 tablet (80 mg total) by mouth daily. 30 tablet 1   diclofenac  Sodium (VOLTAREN ) 1 % GEL Apply 2 g topically 4 (four) times daily. 100 g 0   gabapentin  (NEURONTIN ) 100 MG capsule Take 1 capsule (100 mg total) by mouth every 8 (eight) hours. 90 capsule 1   hydrOXYzine  (ATARAX ) 10 MG tablet Take 1 tablet (10 mg total) by mouth 3 (three) times daily as needed for itching or anxiety. 30 tablet 0   losartan  (COZAAR ) 25 MG tablet Take 1 tablet (25 mg total) by mouth daily. 30  tablet 1   melatonin 5 MG TABS Take 1 tablet (5 mg total) by mouth at bedtime.     sertraline  (ZOLOFT ) 100 MG tablet Take 1.5 tablets (150 mg total) by mouth daily. 45 tablet 0   spironolactone  (ALDACTONE ) 25 MG tablet Take 1 tablet (25 mg total) by mouth daily. 30 tablet 1   timolol  (TIMOPTIC ) 0.5 % ophthalmic solution Place 1 drop into both eyes daily after breakfast. (Patient not taking: Reported on 12/07/2024) 5 mL 0   torsemide  (DEMADEX ) 20 MG tablet Take 1 tablet (20 mg total) by mouth daily. 30 tablet 1   traMADol  (ULTRAM ) 50 MG tablet Take 1 tablet (50 mg total) by mouth 3 (three) times daily as needed for moderate pain (pain score 4-6). 30 tablet 0   No current facility-administered medications for this visit.   No Known Allergies  Social History   Socioeconomic History   Marital status: Widowed    Spouse name: Not on file   Number of children: 2   Years of education: 14   Highest education level: Not on file  Occupational History   Occupation: [employer]  Tobacco Use   Smoking status: Every Day    Types: Cigarettes   Smokeless tobacco: Former    Quit date: 11/14/2020  Vaping Use   Vaping status: Never Used  Substance and Sexual Activity   Alcohol use: No   Drug use: Yes    Types: Cocaine   Sexual activity: Yes  Other Topics Concern   Not on file  Social History Narrative   Lives with 2 young children in [place].  Wife died in 23-May-2020.  Works as a chartered certified accountant in web designer.  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   Left-handed   Caffeine: occasional tea   Social Drivers of Health   Tobacco Use: High Risk (12/08/2024)   Patient History    Smoking Tobacco Use: Every Day    Smokeless Tobacco Use: Former    Passive Exposure: Not on Actuary Strain: Not on file  Food Insecurity: Food Insecurity Present (12/07/2024)   Epic    Worried About Programme Researcher, Broadcasting/film/video in the Last Year: Sometimes true    Ran Out of Food in the Last Year: Sometimes true  Transportation  Needs: Unmet Transportation Needs (12/07/2024)   Epic    Lack of Transportation (Medical): Yes    Lack of Transportation (Non-Medical): Yes  Physical Activity: Not on file  Stress: Not on file  Social Connections: Unknown (05/01/2022)   Received from [facility]   Social Network    Social Network: Not on file  Intimate Partner Violence: Not At Risk (12/07/2024)   Epic    Fear of Current or Ex-Partner: No    Emotionally Abused: No    Physically Abused: No    Sexually Abused: No  Depression (PHQ2-9): Low Risk (12/07/2024)   Depression (PHQ2-9)  PHQ-2 Score: 2  Alcohol Screen: Low Risk (01/14/2024)   Alcohol Screen    Last Alcohol Screening Score (AUDIT): 0  Housing: High Risk (12/07/2024)   Epic    Unable to Pay for Housing in the Last Year: Yes    Number of Times Moved in the Last Year: Not on file    Homeless in the Last Year: No  Utilities: At Risk (12/07/2024)   Epic    Threatened with loss of utilities: Yes  Health Literacy: Not on file   Family History  Problem Relation Age of Onset   Heart attack Father        died @ 19   Cervical cancer Mother        died @ 38   There were no vitals taken for this visit.  Wt Readings from Last 3 Encounters:  12/08/24 66.3 kg (146 lb 3.2 oz)  12/03/24 63 kg (138 lb 14.4 oz)  06/29/24 65.1 kg (143 lb 8.3 oz)   PHYSICAL EXAM: General:  Thin, chronically ill appearing Cor: No JVD. Regular rate & rhythm. No murmurs. Lungs: clear Abdomen: soft, nontender, nondistended. Extremities: no edema Neuro: alert & orientedx3. Affect pleasant   ASSESSMENT & PLAN: 1. Chronic HFrEF: Nonischemic cardiomyopathy.  Due to poorly controlled HTN +/- crack/cocaine abuse.  Normal coronaries on 2015 cath.  Echo in 1/22 showed EF < 20% with moderate RV dysfunction (off meds, + cocaine).  Echo in 5/22 with EF up to 50% (on meds and off cocaine). Echo 1/23 (off meds and on cocaine) EF down to 30-35%. Echo 7/24 EF 25-30%, GIDD, RV normal, LA mildly  dilated, small pericardial effusion present. Most recent Echo 06/2024 LVEF severely reduced <20%, RV okay, moderate MR - NYHA III. Appears euvolemic. Continue torsemide  20 mg daily  - Has not been on beta blocker d/t cocaine use. Consider adding back in the future.  - Switch losartan  to entresto  49/51 mg BID - Continue spironolactone  25 mg daily.  - May need to cut back amlodipine  in the future to allow room for GDMT titration - Did not tolerate Farxiga  due to dizziness  - labs today - Repeat echo once on maximally tolerated GDMT 2. HTN:  - BP above goal. Med changes as above 3. OSA: Not compliant with CPAP, needs new one.  - Can refer for sleep study once other issues are more stable 4. Tobacco Use Disorder:  No tobacco use, states he quit 11/25 5. Cocaine Use Disorder:  UDS + cocaine 06/2024. Last used Crack cocaine 11/2024. Wants to stop. Referred to HFSW for assistance. He has been contacting [facility] weekly to see if beds are available 6. CKD 3: Stage IIIa Creatinine 1.5.  7. Hyperlipidemia: Continue atorvastatin .  8. Depression/Anxiety: Worse recently - Per PCP - Gave short supply of hydroxyzine  which he uses for anxiety. Needs additional refills from primary 9. T2DM: management per PCP. - Did not tolerate Farxiga  due to dizziness. 10. 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 11. Medication Noncompliance:  - Ongoing issue, contributing to readmissions. Actively trying to improve. Sent med refills to [facility] for mail delivery.   SDOH: Food insecurity - given food bag today. Has food stamp application pending Staff message sent to HF CSW for assistance with transportation and medicare/medicaid questions  Follow up 3 weeks with APP for GDMT titration and assess compliance  Curley Hogen N, PA-C 12/29/2024 "

## 2024-12-29 NOTE — Telephone Encounter (Signed)
 Advanced Heart Failure Patient Advocate Encounter  Test billing for this patient's current coverage ([place] Medicaid) returns a $4 copay for 90 day supply of Entresto  (DAW 9).  This test claim was processed through [facility]- copay amounts may vary at other pharmacies due to pharmacy/plan contracts, or as the patient moves through the different stages of their insurance plan.  Rachel DEL, CPhT Rx Patient Advocate Phone: [phone]

## 2024-12-29 NOTE — Patient Instructions (Addendum)
 Medication Changes:  STOP LOSARTAN    START ENTRESTO  49/51MG  TWICE DAILY   HYDROXYZINE  PRESCRIPTION REFILLED FOR 1X COURTESY REFILL--FUTURE REFILLS WILL NEED TO COME FROM PCP   Lab Work:  Labs done today, your results will be available in MyChart, we will contact you for abnormal readings.  Special Instructions // Education:  FOOD BAG GIVEN TODAY, MESSAGE SENT TO SOCIAL WORK  FOR THEM TO REACH OUT TO YOU   Follow-Up in: 3 WEEKS AS SCHEDULED WITH APP CLINIC   FOLLOW UP WITH PRIMARY CARE AS SCHEDULED ON FEBRUARY 25TH 1:35PM  At the Advanced Heart Failure Clinic, you and your health needs are our priority. We have a designated team specialized in the treatment of Heart Failure. This Care Team includes your primary Heart Failure Specialized Cardiologist (physician), Advanced Practice Providers (APPs- Physician Assistants and Nurse Practitioners), and Pharmacist who all work together to provide you with the care you need, when you need it.   You may see any of the following providers on your designated Care Team at your next follow up:  Dr. Toribio Fuel Dr. Ezra Shuck Dr. Odis Brownie Greig Mosses, NP Caffie Shed, [place] [facility] [place], [place] Beckey Coe, NP Jordan Lee, NP Tinnie Redman, PharmD   Please be sure to bring in all your medications bottles to every appointment.   Need to Contact Us :  If you have any questions or concerns before your next appointment please send us  a message through [place] or call our office at [phone].    TO LEAVE A MESSAGE FOR THE NURSE SELECT OPTION 2, PLEASE LEAVE A MESSAGE INCLUDING: YOUR NAME DATE OF BIRTH CALL BACK NUMBER REASON FOR CALL**this is important as we prioritize the call backs  YOU WILL RECEIVE A CALL BACK THE SAME DAY AS LONG AS YOU CALL BEFORE 4:00 PM

## 2025-01-14 ENCOUNTER — Telehealth (HOSPITAL_COMMUNITY): Payer: Self-pay | Admitting: Licensed Clinical Social Worker

## 2025-01-14 NOTE — Telephone Encounter (Signed)
CSW attempted to call pt to check in- unable to reach- left VM requesting return call  Cillian Gwinner H. Alexiah Koroma, LCSW Clinical Social Worker Advanced Heart Failure Clinic Desk#: [phone] Cell#: [phone]  

## 2025-01-15 ENCOUNTER — Telehealth (HOSPITAL_COMMUNITY): Payer: Self-pay | Admitting: Licensed Clinical Social Worker

## 2025-01-15 NOTE — Telephone Encounter (Signed)
 H&V Care Navigation CSW Progress Note  Clinical Social Worker received call from pt requesting help with transportation to next weeks appt.  [employer] arranged to bring pt.  Pt also inquired about help getting into [facility]- CSW called [facility] but they are not taking new patients as they are about to switch locations.  CSW attempted to call pt to discuss other options- unable to reach- left VM.  Andriette HILARIO Leech, LCSW Clinical Social Worker Advanced [facility] Failure Clinic Desk#: [phone] Cell#: [phone]

## 2025-01-18 ENCOUNTER — Telehealth (HOSPITAL_COMMUNITY): Payer: Self-pay

## 2025-01-19 ENCOUNTER — Encounter (HOSPITAL_COMMUNITY): Payer: Self-pay

## 2025-01-19 ENCOUNTER — Ambulatory Visit (HOSPITAL_COMMUNITY): Payer: Self-pay | Admitting: Internal Medicine

## 2025-01-19 ENCOUNTER — Ambulatory Visit (HOSPITAL_COMMUNITY)
Admission: RE | Admit: 2025-01-19 | Discharge: 2025-01-19 | Disposition: A | Source: Ambulatory Visit | Attending: Family Medicine

## 2025-01-19 VITALS — BP 120/70 | HR 72 | Wt 149.0 lb

## 2025-01-19 DIAGNOSIS — Z139 Encounter for screening, unspecified: Secondary | ICD-10-CM

## 2025-01-19 DIAGNOSIS — Z79899 Other long term (current) drug therapy: Secondary | ICD-10-CM | POA: Insufficient documentation

## 2025-01-19 DIAGNOSIS — Z720 Tobacco use: Secondary | ICD-10-CM | POA: Diagnosis not present

## 2025-01-19 DIAGNOSIS — I10 Essential (primary) hypertension: Secondary | ICD-10-CM | POA: Diagnosis not present

## 2025-01-19 DIAGNOSIS — Z8673 Personal history of transient ischemic attack (TIA), and cerebral infarction without residual deficits: Secondary | ICD-10-CM | POA: Diagnosis not present

## 2025-01-19 DIAGNOSIS — E1121 Type 2 diabetes mellitus with diabetic nephropathy: Secondary | ICD-10-CM

## 2025-01-19 DIAGNOSIS — N183 Chronic kidney disease, stage 3 unspecified: Secondary | ICD-10-CM | POA: Diagnosis not present

## 2025-01-19 DIAGNOSIS — I5022 Chronic systolic (congestive) heart failure: Secondary | ICD-10-CM | POA: Diagnosis not present

## 2025-01-19 DIAGNOSIS — F0631 Mood disorder due to known physiological condition with depressive features: Secondary | ICD-10-CM

## 2025-01-19 DIAGNOSIS — E1122 Type 2 diabetes mellitus with diabetic chronic kidney disease: Secondary | ICD-10-CM | POA: Insufficient documentation

## 2025-01-19 DIAGNOSIS — F32A Depression, unspecified: Secondary | ICD-10-CM | POA: Insufficient documentation

## 2025-01-19 DIAGNOSIS — Z87891 Personal history of nicotine dependence: Secondary | ICD-10-CM | POA: Insufficient documentation

## 2025-01-19 DIAGNOSIS — N1831 Chronic kidney disease, stage 3a: Secondary | ICD-10-CM | POA: Insufficient documentation

## 2025-01-19 DIAGNOSIS — E785 Hyperlipidemia, unspecified: Secondary | ICD-10-CM | POA: Insufficient documentation

## 2025-01-19 DIAGNOSIS — F1410 Cocaine abuse, uncomplicated: Secondary | ICD-10-CM | POA: Diagnosis not present

## 2025-01-19 DIAGNOSIS — E782 Mixed hyperlipidemia: Secondary | ICD-10-CM | POA: Diagnosis not present

## 2025-01-19 DIAGNOSIS — I130 Hypertensive heart and chronic kidney disease with heart failure and stage 1 through stage 4 chronic kidney disease, or unspecified chronic kidney disease: Secondary | ICD-10-CM | POA: Insufficient documentation

## 2025-01-19 DIAGNOSIS — R0602 Shortness of breath: Secondary | ICD-10-CM | POA: Insufficient documentation

## 2025-01-19 DIAGNOSIS — Z91148 Patient's other noncompliance with medication regimen for other reason: Secondary | ICD-10-CM | POA: Insufficient documentation

## 2025-01-19 DIAGNOSIS — I428 Other cardiomyopathies: Secondary | ICD-10-CM | POA: Insufficient documentation

## 2025-01-19 DIAGNOSIS — G4733 Obstructive sleep apnea (adult) (pediatric): Secondary | ICD-10-CM | POA: Diagnosis not present

## 2025-01-19 DIAGNOSIS — F419 Anxiety disorder, unspecified: Secondary | ICD-10-CM | POA: Insufficient documentation

## 2025-01-19 DIAGNOSIS — R42 Dizziness and giddiness: Secondary | ICD-10-CM | POA: Insufficient documentation

## 2025-01-19 LAB — BASIC METABOLIC PANEL WITH GFR
Anion gap: 13 (ref 5–15)
BUN: 23 mg/dL — ABNORMAL HIGH (ref 6–20)
CO2: 25 mmol/L (ref 22–32)
Calcium: 9.1 mg/dL (ref 8.9–10.3)
Chloride: 102 mmol/L (ref 98–111)
Creatinine, Ser: 1.64 mg/dL — ABNORMAL HIGH (ref 0.61–1.24)
GFR, Estimated: 48 mL/min — ABNORMAL LOW
Glucose, Bld: 240 mg/dL — ABNORMAL HIGH (ref 70–99)
Potassium: 3.2 mmol/L — ABNORMAL LOW (ref 3.5–5.1)
Sodium: 139 mmol/L (ref 135–145)

## 2025-01-19 LAB — PRO BRAIN NATRIURETIC PEPTIDE: Pro Brain Natriuretic Peptide: 1419 pg/mL — ABNORMAL HIGH

## 2025-01-19 LAB — LIPID PANEL
Cholesterol: 132 mg/dL (ref 0–200)
HDL: 55 mg/dL
LDL Cholesterol: 60 mg/dL (ref 0–99)
Total CHOL/HDL Ratio: 2.4 ratio
Triglycerides: 86 mg/dL
VLDL: 17 mg/dL (ref 0–40)

## 2025-01-19 NOTE — Patient Instructions (Addendum)
 Good to see you today!   Labs done today, your results will be available in MyChart, we will contact you for abnormal readings.  Your physician recommends that you schedule a follow-up appointment as scheduled  If you have any questions or concerns before your next appointment please send us  a message through [place] or call our office at [phone].    TO LEAVE A MESSAGE FOR THE NURSE SELECT OPTION 2, PLEASE LEAVE A MESSAGE INCLUDING: YOUR NAME DATE OF BIRTH CALL BACK NUMBER REASON FOR CALL**this is important as we prioritize the call backs  YOU WILL RECEIVE A CALL BACK THE SAME DAY AS LONG AS YOU CALL BEFORE 4:00 PM At the Advanced Heart Failure Clinic, you and your health needs are our priority. As part of our continuing mission to provide you with exceptional heart care, we have created designated Provider Care Teams. These Care Teams include your primary Cardiologist (physician) and Advanced Practice Providers (APPs- Physician Assistants and Nurse Practitioners) who all work together to provide you with the care you need, when you need it.   You may see any of the following providers on your designated Care Team at your next follow up: Dr Toribio Fuel Dr Ezra Shuck Dr. Morene Brownie Greig Mosses, NP Caffie Shed, [place] [facility] [place], [place] Beckey Coe, NP Jordan Lee, NP Ellouise Class, NP Tinnie Redman, PharmD Jaun Bash, PharmD   Please be sure to bring in all your medications bottles to every appointment.    Thank you for choosing [facility]

## 2025-01-20 ENCOUNTER — Telehealth (HOSPITAL_COMMUNITY): Payer: Self-pay | Admitting: Licensed Clinical Social Worker

## 2025-01-20 NOTE — Telephone Encounter (Signed)
 H&V Care Navigation CSW Progress Note  Clinical Social Worker spoke with [facility] who confirm that they accept patients insurance- need him to call in for intake.  CSW called patient x2 to discuss and left VM- will assist pt in connecting with intake department when he returns call  Andriette HILARIO Leech, LCSW Clinical Social Worker Advanced [facility] Failure Clinic Desk#: [phone] Cell#: [phone]

## 2025-01-21 ENCOUNTER — Telehealth (HOSPITAL_COMMUNITY): Payer: Self-pay | Admitting: Licensed Clinical Social Worker

## 2025-01-21 NOTE — Telephone Encounter (Signed)
 Pt returned CSW call- CSW informed of [facility] rehab which can accept patient with his insurance provided him with number so he can call and speak to admissions team to complete assessment  Andriette HILARIO Leech, LCSW Clinical Social Worker Advanced [facility] Failure Clinic Desk#: [phone] Cell#: [phone]

## 2025-01-22 ENCOUNTER — Telehealth (HOSPITAL_COMMUNITY): Payer: Self-pay | Admitting: Licensed Clinical Social Worker

## 2025-01-22 NOTE — Telephone Encounter (Signed)
 H&V Care Navigation CSW Progress Note  Pt informed CSW that he called [facility] but they do not have availability for him at this time- will likely have a bed towards the end of the month.  Pt wanting to get somewhere ASAP as he is really struggling at this time.   Informed pt of [facility] 24 hour urgent care/ detox- but he is not interested at this time as he had bad experience there before.  Encouraged him to go if his symptoms worsened or to go to ED for evaluation.  CSW called [facility] but they only accept patients for detox services who have Medicaid.  Called [facility] again to ask about timeline for accepting new patients- now they are reporting patient would not be eligible for services as he has Medicare primary.  Called [facility] in [place]- they could accept patient on Feb 27th- provided number to patient.  CSW will continue to follow and assist as needed  Matthew HILARIO Leech, LCSW Clinical Social Worker Advanced Heart Failure Clinic Desk#: [phone] Cell#: [phone]

## 2025-02-09 ENCOUNTER — Ambulatory Visit (HOSPITAL_COMMUNITY)

## 2025-02-10 ENCOUNTER — Ambulatory Visit: Payer: Self-pay | Admitting: Family Medicine
